# Patient Record
Sex: Female | Born: 1960 | Race: White | Hispanic: No | Marital: Married | Smoking: Never smoker
Health system: Southern US, Community
[De-identification: ages and names within clinical notes are randomized; demographics above are authoritative.]

## PROBLEM LIST (undated history)

## (undated) DIAGNOSIS — T4145XA Adverse effect of unspecified anesthetic, initial encounter: Secondary | ICD-10-CM

## (undated) DIAGNOSIS — R0600 Dyspnea, unspecified: Secondary | ICD-10-CM

## (undated) DIAGNOSIS — I10 Essential (primary) hypertension: Secondary | ICD-10-CM

## (undated) DIAGNOSIS — R112 Nausea with vomiting, unspecified: Secondary | ICD-10-CM

## (undated) DIAGNOSIS — J45909 Unspecified asthma, uncomplicated: Secondary | ICD-10-CM

## (undated) DIAGNOSIS — R011 Cardiac murmur, unspecified: Secondary | ICD-10-CM

## (undated) DIAGNOSIS — H9190 Unspecified hearing loss, unspecified ear: Secondary | ICD-10-CM

## (undated) DIAGNOSIS — C801 Malignant (primary) neoplasm, unspecified: Secondary | ICD-10-CM

## (undated) DIAGNOSIS — E7800 Pure hypercholesterolemia, unspecified: Secondary | ICD-10-CM

## (undated) DIAGNOSIS — Z9889 Other specified postprocedural states: Secondary | ICD-10-CM

## (undated) DIAGNOSIS — M1990 Unspecified osteoarthritis, unspecified site: Secondary | ICD-10-CM

## (undated) DIAGNOSIS — T8859XA Other complications of anesthesia, initial encounter: Secondary | ICD-10-CM

## (undated) DIAGNOSIS — C641 Malignant neoplasm of right kidney, except renal pelvis: Secondary | ICD-10-CM

## (undated) DIAGNOSIS — E079 Disorder of thyroid, unspecified: Secondary | ICD-10-CM

## (undated) DIAGNOSIS — D649 Anemia, unspecified: Secondary | ICD-10-CM

## (undated) DIAGNOSIS — Z8489 Family history of other specified conditions: Secondary | ICD-10-CM

## (undated) DIAGNOSIS — G459 Transient cerebral ischemic attack, unspecified: Secondary | ICD-10-CM

## (undated) DIAGNOSIS — I499 Cardiac arrhythmia, unspecified: Secondary | ICD-10-CM

## (undated) HISTORY — PX: TONSILLECTOMY: SUR1361

## (undated) HISTORY — PX: COLON SURGERY: SHX602

## (undated) HISTORY — PX: ABDOMINAL HYSTERECTOMY: SHX81

## (undated) HISTORY — PX: CHOLECYSTECTOMY: SHX55

## (undated) HISTORY — PX: KNEE ARTHROSCOPY: SUR90

## (undated) HISTORY — PX: OTHER SURGICAL HISTORY: SHX169

---

## 1997-07-26 ENCOUNTER — Emergency Department (HOSPITAL_COMMUNITY): Admission: EM | Admit: 1997-07-26 | Discharge: 1997-07-26 | Payer: Self-pay | Admitting: Emergency Medicine

## 1997-07-27 ENCOUNTER — Inpatient Hospital Stay (HOSPITAL_COMMUNITY): Admission: EM | Admit: 1997-07-27 | Discharge: 1997-07-29 | Payer: Self-pay | Admitting: Emergency Medicine

## 1998-03-11 ENCOUNTER — Emergency Department (HOSPITAL_COMMUNITY): Admission: EM | Admit: 1998-03-11 | Discharge: 1998-03-11 | Payer: Self-pay | Admitting: Emergency Medicine

## 1998-03-11 ENCOUNTER — Encounter: Payer: Self-pay | Admitting: Emergency Medicine

## 1999-07-13 ENCOUNTER — Other Ambulatory Visit: Admission: RE | Admit: 1999-07-13 | Discharge: 1999-07-13 | Payer: Self-pay | Admitting: *Deleted

## 1999-07-31 ENCOUNTER — Encounter: Payer: Self-pay | Admitting: *Deleted

## 1999-07-31 ENCOUNTER — Ambulatory Visit (HOSPITAL_COMMUNITY): Admission: RE | Admit: 1999-07-31 | Discharge: 1999-07-31 | Payer: Self-pay | Admitting: *Deleted

## 2000-09-15 ENCOUNTER — Encounter: Payer: Self-pay | Admitting: Orthopedic Surgery

## 2000-09-15 ENCOUNTER — Encounter: Admission: RE | Admit: 2000-09-15 | Discharge: 2000-09-15 | Payer: Self-pay | Admitting: Orthopedic Surgery

## 2001-07-13 ENCOUNTER — Other Ambulatory Visit: Admission: RE | Admit: 2001-07-13 | Discharge: 2001-07-13 | Payer: Self-pay | Admitting: Family Medicine

## 2002-07-31 ENCOUNTER — Encounter: Admission: RE | Admit: 2002-07-31 | Discharge: 2002-07-31 | Payer: Self-pay | Admitting: Family Medicine

## 2002-07-31 ENCOUNTER — Encounter: Payer: Self-pay | Admitting: Family Medicine

## 2002-09-06 ENCOUNTER — Ambulatory Visit (HOSPITAL_COMMUNITY): Admission: RE | Admit: 2002-09-06 | Discharge: 2002-09-06 | Payer: Self-pay | Admitting: Obstetrics & Gynecology

## 2002-09-06 ENCOUNTER — Encounter: Payer: Self-pay | Admitting: Obstetrics & Gynecology

## 2003-07-24 ENCOUNTER — Encounter: Admission: RE | Admit: 2003-07-24 | Discharge: 2003-07-24 | Payer: Self-pay | Admitting: Family Medicine

## 2003-11-04 ENCOUNTER — Ambulatory Visit (HOSPITAL_COMMUNITY): Admission: RE | Admit: 2003-11-04 | Discharge: 2003-11-04 | Payer: Self-pay | Admitting: Obstetrics & Gynecology

## 2003-11-23 ENCOUNTER — Inpatient Hospital Stay (HOSPITAL_COMMUNITY): Admission: EM | Admit: 2003-11-23 | Discharge: 2003-11-26 | Payer: Self-pay | Admitting: *Deleted

## 2005-09-23 ENCOUNTER — Ambulatory Visit (HOSPITAL_COMMUNITY): Admission: RE | Admit: 2005-09-23 | Discharge: 2005-09-23 | Payer: Self-pay | Admitting: Gastroenterology

## 2006-04-30 ENCOUNTER — Emergency Department (HOSPITAL_COMMUNITY): Admission: EM | Admit: 2006-04-30 | Discharge: 2006-04-30 | Payer: Self-pay | Admitting: Emergency Medicine

## 2006-05-24 ENCOUNTER — Emergency Department (HOSPITAL_COMMUNITY): Admission: EM | Admit: 2006-05-24 | Discharge: 2006-05-24 | Payer: Self-pay | Admitting: Emergency Medicine

## 2007-04-27 ENCOUNTER — Emergency Department (HOSPITAL_COMMUNITY): Admission: EM | Admit: 2007-04-27 | Discharge: 2007-04-27 | Payer: Self-pay | Admitting: Emergency Medicine

## 2007-04-30 ENCOUNTER — Emergency Department (HOSPITAL_COMMUNITY): Admission: EM | Admit: 2007-04-30 | Discharge: 2007-04-30 | Payer: Self-pay | Admitting: Emergency Medicine

## 2008-07-07 ENCOUNTER — Inpatient Hospital Stay (HOSPITAL_COMMUNITY): Admission: EM | Admit: 2008-07-07 | Discharge: 2008-07-12 | Payer: Self-pay | Admitting: Emergency Medicine

## 2008-12-02 ENCOUNTER — Emergency Department (HOSPITAL_COMMUNITY): Admission: EM | Admit: 2008-12-02 | Discharge: 2008-12-02 | Payer: Self-pay | Admitting: Emergency Medicine

## 2009-01-13 ENCOUNTER — Emergency Department (HOSPITAL_COMMUNITY): Admission: EM | Admit: 2009-01-13 | Discharge: 2009-01-13 | Payer: Self-pay | Admitting: Emergency Medicine

## 2009-01-31 ENCOUNTER — Emergency Department (HOSPITAL_COMMUNITY): Admission: EM | Admit: 2009-01-31 | Discharge: 2009-01-31 | Payer: Self-pay | Admitting: Emergency Medicine

## 2009-04-18 ENCOUNTER — Emergency Department (HOSPITAL_COMMUNITY): Admission: EM | Admit: 2009-04-18 | Discharge: 2009-04-18 | Payer: Self-pay | Admitting: Emergency Medicine

## 2009-04-22 ENCOUNTER — Emergency Department (HOSPITAL_COMMUNITY): Admission: EM | Admit: 2009-04-22 | Discharge: 2009-04-23 | Payer: Self-pay | Admitting: Emergency Medicine

## 2009-04-29 ENCOUNTER — Emergency Department (HOSPITAL_COMMUNITY): Admission: EM | Admit: 2009-04-29 | Discharge: 2009-04-29 | Payer: Self-pay | Admitting: Emergency Medicine

## 2009-05-15 ENCOUNTER — Ambulatory Visit (HOSPITAL_COMMUNITY): Admission: RE | Admit: 2009-05-15 | Discharge: 2009-05-15 | Payer: Self-pay | Admitting: Obstetrics & Gynecology

## 2009-05-29 ENCOUNTER — Emergency Department (HOSPITAL_COMMUNITY): Admission: EM | Admit: 2009-05-29 | Discharge: 2009-05-30 | Payer: Self-pay | Admitting: Emergency Medicine

## 2009-06-13 ENCOUNTER — Inpatient Hospital Stay (HOSPITAL_COMMUNITY): Admission: AD | Admit: 2009-06-13 | Discharge: 2009-06-14 | Payer: Self-pay | Admitting: Obstetrics & Gynecology

## 2009-06-13 ENCOUNTER — Encounter: Payer: Self-pay | Admitting: Obstetrics & Gynecology

## 2009-07-11 ENCOUNTER — Emergency Department (HOSPITAL_COMMUNITY): Admission: EM | Admit: 2009-07-11 | Discharge: 2009-07-11 | Payer: Self-pay | Admitting: Emergency Medicine

## 2009-11-01 ENCOUNTER — Emergency Department (HOSPITAL_COMMUNITY): Admission: EM | Admit: 2009-11-01 | Discharge: 2009-11-01 | Payer: Self-pay | Admitting: Emergency Medicine

## 2009-11-05 ENCOUNTER — Observation Stay (HOSPITAL_COMMUNITY): Admission: EM | Admit: 2009-11-05 | Discharge: 2009-11-06 | Payer: Self-pay | Admitting: Emergency Medicine

## 2010-02-15 ENCOUNTER — Emergency Department (HOSPITAL_COMMUNITY): Admission: EM | Admit: 2010-02-15 | Discharge: 2010-02-16 | Payer: Self-pay | Admitting: Internal Medicine

## 2010-02-23 ENCOUNTER — Emergency Department (HOSPITAL_COMMUNITY): Admission: EM | Admit: 2010-02-23 | Discharge: 2010-02-23 | Payer: Self-pay | Admitting: Emergency Medicine

## 2010-03-03 ENCOUNTER — Emergency Department (HOSPITAL_COMMUNITY)
Admission: EM | Admit: 2010-03-03 | Discharge: 2010-03-03 | Payer: Self-pay | Source: Home / Self Care | Admitting: Emergency Medicine

## 2010-03-05 ENCOUNTER — Emergency Department (HOSPITAL_COMMUNITY): Admission: EM | Admit: 2010-03-05 | Discharge: 2009-09-11 | Payer: Self-pay | Admitting: Emergency Medicine

## 2010-04-18 ENCOUNTER — Encounter: Payer: Self-pay | Admitting: Family Medicine

## 2010-06-08 LAB — URINALYSIS, ROUTINE W REFLEX MICROSCOPIC
Bilirubin Urine: NEGATIVE
Hgb urine dipstick: NEGATIVE
Ketones, ur: NEGATIVE mg/dL
Protein, ur: NEGATIVE mg/dL
Urobilinogen, UA: 0.2 mg/dL (ref 0.0–1.0)

## 2010-06-08 LAB — GLUCOSE, CAPILLARY: Glucose-Capillary: 137 mg/dL — ABNORMAL HIGH (ref 70–99)

## 2010-06-09 LAB — DIFFERENTIAL
Basophils Absolute: 0 10*3/uL (ref 0.0–0.1)
Eosinophils Absolute: 0 10*3/uL (ref 0.0–0.7)
Eosinophils Relative: 0 % (ref 0–5)
Lymphocytes Relative: 31 % (ref 12–46)
Lymphs Abs: 2.5 10*3/uL (ref 0.7–4.0)
Neutro Abs: 6.7 10*3/uL (ref 1.7–7.7)

## 2010-06-09 LAB — BASIC METABOLIC PANEL
BUN: 16 mg/dL (ref 6–23)
Creatinine, Ser: 0.75 mg/dL (ref 0.4–1.2)
GFR calc Af Amer: >60 mL/min (ref >60)
GFR calc non Af Amer: >60 mL/min (ref >60)
Potassium: 3.6 mEq/L (ref 3.5–5.1)

## 2010-06-09 LAB — TROPONIN I: Troponin I: 0.01 ng/mL (ref 0.00–0.06)

## 2010-06-09 LAB — POCT I-STAT, CHEM 8
Creatinine, Ser: 1 mg/dL (ref 0.4–1.2)
Hemoglobin: 15 g/dL (ref 12.0–15.0)
Sodium: 139 mEq/L (ref 135–145)
TCO2: 24 mmol/L (ref 0–100)

## 2010-06-09 LAB — CK TOTAL AND CKMB (NOT AT ARMC)
CK, MB: 1 ng/mL (ref 0.3–4.0)
Relative Index: INVALID (ref 0.0–2.5)
Total CK: 53 U/L (ref 7–177)

## 2010-06-09 LAB — CBC
MCH: 29.9 pg (ref 26.0–34.0)
MCV: 86.3 fL (ref 78.0–100.0)
Platelets: 343 10*3/uL (ref 150–400)
Platelets: 360 10*3/uL (ref 150–400)
RBC: 4.72 MIL/uL (ref 3.87–5.11)
RDW: 13.7 % (ref 11.5–15.5)
RDW: 14.5 % (ref 11.5–15.5)
WBC: 11.4 10*3/uL — ABNORMAL HIGH (ref 4.0–10.5)

## 2010-06-09 LAB — URINALYSIS, ROUTINE W REFLEX MICROSCOPIC
Bilirubin Urine: NEGATIVE
Hgb urine dipstick: NEGATIVE
Ketones, ur: NEGATIVE mg/dL
Protein, ur: NEGATIVE mg/dL
Urobilinogen, UA: 0.2 mg/dL (ref 0.0–1.0)

## 2010-06-09 LAB — BRAIN NATRIURETIC PEPTIDE: Pro B Natriuretic peptide (BNP): 47 pg/mL (ref 0.0–100.0)

## 2010-06-12 LAB — COMPREHENSIVE METABOLIC PANEL
ALT: 18 U/L (ref 0–35)
Alkaline Phosphatase: 62 U/L (ref 39–117)
CO2: 24 mEq/L (ref 19–32)
Glucose, Bld: 125 mg/dL — ABNORMAL HIGH (ref 70–99)
Potassium: 3.2 mEq/L — ABNORMAL LOW (ref 3.5–5.1)
Sodium: 140 mEq/L (ref 135–145)
Total Protein: 6.1 g/dL (ref 6.0–8.3)

## 2010-06-12 LAB — URINALYSIS, ROUTINE W REFLEX MICROSCOPIC
Nitrite: NEGATIVE
Specific Gravity, Urine: 1.01 (ref 1.005–1.030)
pH: 6 (ref 5.0–8.0)

## 2010-06-12 LAB — POCT URINALYSIS DIPSTICK
Bilirubin Urine: NEGATIVE
Nitrite: NEGATIVE
Protein, ur: NEGATIVE mg/dL
Urobilinogen, UA: 0.2 mg/dL (ref 0.0–1.0)
pH: 5.5 (ref 5.0–8.0)

## 2010-06-12 LAB — URINE CULTURE: Culture  Setup Time: 201108071207

## 2010-06-12 LAB — GLUCOSE, CAPILLARY
Glucose-Capillary: 123 mg/dL — ABNORMAL HIGH (ref 70–99)
Glucose-Capillary: 125 mg/dL — ABNORMAL HIGH (ref 70–99)
Glucose-Capillary: 128 mg/dL — ABNORMAL HIGH (ref 70–99)
Glucose-Capillary: 155 mg/dL — ABNORMAL HIGH (ref 70–99)

## 2010-06-12 LAB — CBC
HCT: 34 % — ABNORMAL LOW (ref 36.0–46.0)
HCT: 40 % (ref 36.0–46.0)
Hemoglobin: 11.4 g/dL — ABNORMAL LOW (ref 12.0–15.0)
MCH: 29.6 pg (ref 26.0–34.0)
MCHC: 34.3 g/dL (ref 30.0–36.0)
RDW: 13.7 % (ref 11.5–15.5)
RDW: 13.9 % (ref 11.5–15.5)
WBC: 7.1 10*3/uL (ref 4.0–10.5)

## 2010-06-12 LAB — DIFFERENTIAL
Basophils Absolute: 0 10*3/uL (ref 0.0–0.1)
Basophils Relative: 0 % (ref 0–1)
Basophils Relative: 0 % (ref 0–1)
Eosinophils Absolute: 0.3 10*3/uL (ref 0.0–0.7)
Eosinophils Absolute: 0.3 10*3/uL (ref 0.0–0.7)
Monocytes Absolute: 0.7 10*3/uL (ref 0.1–1.0)
Monocytes Relative: 6 % (ref 3–12)
Neutro Abs: 6.9 10*3/uL (ref 1.7–7.7)
Neutrophils Relative %: 55 % (ref 43–77)
Neutrophils Relative %: 67 % (ref 43–77)

## 2010-06-12 LAB — BASIC METABOLIC PANEL
BUN: 20 mg/dL (ref 6–23)
CO2: 27 mEq/L (ref 19–32)
Calcium: 9.2 mg/dL (ref 8.4–10.5)
GFR calc non Af Amer: 57 mL/min — ABNORMAL LOW (ref >60)
Glucose, Bld: 150 mg/dL — ABNORMAL HIGH (ref 70–99)

## 2010-06-12 LAB — HEPATIC FUNCTION PANEL
AST: 23 U/L (ref 0–37)
Bilirubin, Direct: 0.2 mg/dL (ref 0.0–0.3)
Total Bilirubin: 1 mg/dL (ref 0.3–1.2)

## 2010-06-12 LAB — CULTURE, BLOOD (ROUTINE X 2): Culture: NO GROWTH

## 2010-06-12 LAB — URINE MICROSCOPIC-ADD ON

## 2010-06-12 LAB — LIPASE, BLOOD: Lipase: 42 U/L (ref 11–59)

## 2010-06-14 LAB — DIFFERENTIAL
Basophils Absolute: 0.1 10*3/uL (ref 0.0–0.1)
Basophils Relative: 1 % (ref 0–1)
Eosinophils Relative: 3 % (ref 0–5)
Lymphocytes Relative: 19 % (ref 12–46)
Lymphocytes Relative: 36 % (ref 12–46)
Monocytes Absolute: 0.4 10*3/uL (ref 0.1–1.0)
Monocytes Absolute: 0.6 10*3/uL (ref 0.1–1.0)
Monocytes Relative: 5 % (ref 3–12)
Monocytes Relative: 6 % (ref 3–12)
Neutro Abs: 6.7 10*3/uL (ref 1.7–7.7)
Neutro Abs: 4.9 10*3/uL (ref 1.7–7.7)
Neutrophils Relative %: 73 % (ref 43–77)

## 2010-06-14 LAB — URINALYSIS, ROUTINE W REFLEX MICROSCOPIC
Bilirubin Urine: NEGATIVE
Bilirubin Urine: NEGATIVE
Glucose, UA: NEGATIVE mg/dL
Glucose, UA: NEGATIVE mg/dL
Hgb urine dipstick: NEGATIVE
Hgb urine dipstick: NEGATIVE
Hgb urine dipstick: NEGATIVE
Nitrite: NEGATIVE
Protein, ur: NEGATIVE mg/dL
Specific Gravity, Urine: 1.019 (ref 1.005–1.030)
Specific Gravity, Urine: 1.022 (ref 1.005–1.030)
Specific Gravity, Urine: 1.009 (ref 1.005–1.030)
Urobilinogen, UA: 0.2 mg/dL (ref 0.0–1.0)
Urobilinogen, UA: 0.2 mg/dL (ref 0.0–1.0)
Urobilinogen, UA: 0.2 mg/dL (ref 0.0–1.0)
pH: 6 (ref 5.0–8.0)
pH: 6 (ref 5.0–8.0)

## 2010-06-14 LAB — CBC
HCT: 33.2 % — ABNORMAL LOW (ref 36.0–46.0)
HCT: 38.2 % (ref 36.0–46.0)
Hemoglobin: 13.1 g/dL (ref 12.0–15.0)
MCHC: 34.4 g/dL (ref 30.0–36.0)
MCV: 88.5 fL (ref 78.0–100.0)
Platelets: 275 10*3/uL (ref 150–400)
RBC: 4.31 MIL/uL (ref 3.87–5.11)
RDW: 13 % (ref 11.5–15.5)
RDW: 13.9 % (ref 11.5–15.5)

## 2010-06-14 LAB — URINE MICROSCOPIC-ADD ON

## 2010-06-14 LAB — COMPREHENSIVE METABOLIC PANEL
Albumin: 3.3 g/dL — ABNORMAL LOW (ref 3.5–5.2)
BUN: 9 mg/dL (ref 6–23)
Creatinine, Ser: 0.6 mg/dL (ref 0.4–1.2)
Glucose, Bld: 141 mg/dL — ABNORMAL HIGH (ref 70–99)
Total Bilirubin: 0.6 mg/dL (ref 0.3–1.2)
Total Protein: 6.6 g/dL (ref 6.0–8.3)

## 2010-06-14 LAB — URINE CULTURE
Colony Count: NO GROWTH
Culture: NO GROWTH

## 2010-06-14 LAB — GLUCOSE, CAPILLARY

## 2010-06-14 LAB — BASIC METABOLIC PANEL
CO2: 29 mEq/L (ref 19–32)
Calcium: 9.4 mg/dL (ref 8.4–10.5)
Chloride: 105 mEq/L (ref 96–112)
GFR calc Af Amer: >60 mL/min (ref >60)
Glucose, Bld: 124 mg/dL — ABNORMAL HIGH (ref 70–99)
Potassium: 3.2 mEq/L — ABNORMAL LOW (ref 3.5–5.1)
Sodium: 141 mEq/L (ref 135–145)

## 2010-06-14 LAB — GC/CHLAMYDIA PROBE AMP, GENITAL
Chlamydia, DNA Probe: NEGATIVE
GC Probe Amp, Genital: NEGATIVE

## 2010-06-14 LAB — WET PREP, GENITAL: Yeast Wet Prep HPF POC: NONE SEEN

## 2010-06-16 LAB — URINALYSIS, ROUTINE W REFLEX MICROSCOPIC
Bilirubin Urine: NEGATIVE
Hgb urine dipstick: NEGATIVE
Ketones, ur: NEGATIVE mg/dL
Specific Gravity, Urine: 1.017 (ref 1.005–1.030)
pH: 6 (ref 5.0–8.0)

## 2010-06-16 LAB — CBC
HCT: 37.2 % (ref 36.0–46.0)
MCHC: 34.6 g/dL (ref 30.0–36.0)
MCV: 87.6 fL (ref 78.0–100.0)
RBC: 4.24 MIL/uL (ref 3.87–5.11)
WBC: 8.5 10*3/uL (ref 4.0–10.5)

## 2010-06-16 LAB — DIFFERENTIAL
Basophils Absolute: 0 10*3/uL (ref 0.0–0.1)
Basophils Relative: 0 % (ref 0–1)
Eosinophils Absolute: 0.4 10*3/uL (ref 0.0–0.7)
Eosinophils Relative: 5 % (ref 0–5)
Neutrophils Relative %: 68 % (ref 43–77)

## 2010-06-16 LAB — COMPREHENSIVE METABOLIC PANEL
BUN: 11 mg/dL (ref 6–23)
CO2: 26 mEq/L (ref 19–32)
Chloride: 107 mEq/L (ref 96–112)
Creatinine, Ser: 0.72 mg/dL (ref 0.4–1.2)
GFR calc non Af Amer: >60 mL/min (ref >60)
Glucose, Bld: 124 mg/dL — ABNORMAL HIGH (ref 70–99)
Total Bilirubin: 0.9 mg/dL (ref 0.3–1.2)

## 2010-06-16 LAB — LIPASE, BLOOD: Lipase: 26 U/L (ref 11–59)

## 2010-06-17 LAB — DIFFERENTIAL
Basophils Absolute: 0 10*3/uL (ref 0.0–0.1)
Basophils Relative: 1 % (ref 0–1)
Lymphocytes Relative: 28 % (ref 12–46)
Monocytes Absolute: 0.5 10*3/uL (ref 0.1–1.0)
Neutro Abs: 6.1 10*3/uL (ref 1.7–7.7)
Neutrophils Relative %: 63 % (ref 43–77)

## 2010-06-17 LAB — CBC
MCHC: 33 g/dL (ref 30.0–36.0)
Platelets: 322 10*3/uL (ref 150–400)
RDW: 13.7 % (ref 11.5–15.5)

## 2010-06-17 LAB — WET PREP, GENITAL
Trich, Wet Prep: NONE SEEN
Yeast Wet Prep HPF POC: NONE SEEN

## 2010-06-17 LAB — PREGNANCY, URINE: Preg Test, Ur: NEGATIVE

## 2010-06-17 LAB — RPR: RPR Ser Ql: NONREACTIVE

## 2010-06-22 LAB — CBC
MCHC: 32.8 g/dL (ref 30.0–36.0)
MCV: 90.7 fL (ref 78.0–100.0)
Platelets: 380 10*3/uL (ref 150–400)
RBC: 3.33 MIL/uL — ABNORMAL LOW (ref 3.87–5.11)
WBC: 10.1 10*3/uL (ref 4.0–10.5)
WBC: 12.1 10*3/uL — ABNORMAL HIGH (ref 4.0–10.5)

## 2010-06-22 LAB — COMPREHENSIVE METABOLIC PANEL
ALT: 25 U/L (ref 0–35)
Alkaline Phosphatase: 78 U/L (ref 39–117)
Chloride: 103 mEq/L (ref 96–112)
Glucose, Bld: 120 mg/dL — ABNORMAL HIGH (ref 70–99)
Potassium: 2.9 mEq/L — ABNORMAL LOW (ref 3.5–5.1)
Sodium: 140 mEq/L (ref 135–145)
Total Bilirubin: 0.7 mg/dL (ref 0.3–1.2)
Total Protein: 7.2 g/dL (ref 6.0–8.3)

## 2010-06-22 LAB — GLUCOSE, CAPILLARY
Glucose-Capillary: 133 mg/dL — ABNORMAL HIGH (ref 70–99)
Glucose-Capillary: 135 mg/dL — ABNORMAL HIGH (ref 70–99)
Glucose-Capillary: 169 mg/dL — ABNORMAL HIGH (ref 70–99)

## 2010-06-22 LAB — BASIC METABOLIC PANEL
Calcium: 7.7 mg/dL — ABNORMAL LOW (ref 8.4–10.5)
Creatinine, Ser: 0.67 mg/dL (ref 0.4–1.2)
GFR calc Af Amer: >60 mL/min (ref >60)
GFR calc non Af Amer: >60 mL/min (ref >60)

## 2010-06-22 LAB — POTASSIUM: Potassium: 3.7 mEq/L (ref 3.5–5.1)

## 2010-07-01 LAB — URINALYSIS, ROUTINE W REFLEX MICROSCOPIC
Ketones, ur: NEGATIVE mg/dL
Nitrite: NEGATIVE
Specific Gravity, Urine: 1.015 (ref 1.005–1.030)
pH: 6 (ref 5.0–8.0)

## 2010-07-01 LAB — DIFFERENTIAL
Basophils Relative: 1 % (ref 0–1)
Eosinophils Absolute: 0.2 10*3/uL (ref 0.0–0.7)
Eosinophils Relative: 2 % (ref 0–5)
Lymphs Abs: 2.2 10*3/uL (ref 0.7–4.0)

## 2010-07-01 LAB — COMPREHENSIVE METABOLIC PANEL
ALT: 18 U/L (ref 0–35)
AST: 15 U/L (ref 0–37)
Alkaline Phosphatase: 75 U/L (ref 39–117)
CO2: 24 mEq/L (ref 19–32)
Chloride: 104 mEq/L (ref 96–112)
Creatinine, Ser: 0.64 mg/dL (ref 0.4–1.2)
GFR calc Af Amer: >60 mL/min (ref >60)
GFR calc non Af Amer: >60 mL/min (ref >60)
Total Bilirubin: 0.7 mg/dL (ref 0.3–1.2)

## 2010-07-01 LAB — CBC
MCV: 89.8 fL (ref 78.0–100.0)
RBC: 4.2 MIL/uL (ref 3.87–5.11)
WBC: 8.3 10*3/uL (ref 4.0–10.5)

## 2010-07-01 LAB — LIPASE, BLOOD: Lipase: 21 U/L (ref 11–59)

## 2010-07-01 LAB — URINE MICROSCOPIC-ADD ON

## 2010-07-01 LAB — URINE CULTURE: Colony Count: NO GROWTH

## 2010-07-02 LAB — POCT URINALYSIS DIP (DEVICE)
Ketones, ur: NEGATIVE mg/dL
Protein, ur: NEGATIVE mg/dL
Specific Gravity, Urine: 1.015 (ref 1.005–1.030)
Urobilinogen, UA: 0.2 mg/dL (ref 0.0–1.0)
pH: 5.5 (ref 5.0–8.0)

## 2010-07-02 LAB — URINE CULTURE

## 2010-07-03 LAB — DIFFERENTIAL
Basophils Absolute: 0 K/uL (ref 0.0–0.1)
Basophils Relative: 1 % (ref 0–1)
Eosinophils Absolute: 0.5 K/uL (ref 0.0–0.7)
Eosinophils Relative: 5 % (ref 0–5)
Lymphocytes Relative: 25 % (ref 12–46)
Lymphs Abs: 2.3 K/uL (ref 0.7–4.0)
Monocytes Absolute: 0.5 K/uL (ref 0.1–1.0)
Monocytes Relative: 5 % (ref 3–12)
Neutro Abs: 6 K/uL (ref 1.7–7.7)
Neutrophils Relative %: 65 % (ref 43–77)

## 2010-07-03 LAB — URINALYSIS, ROUTINE W REFLEX MICROSCOPIC
Bilirubin Urine: NEGATIVE
Glucose, UA: NEGATIVE mg/dL
Hgb urine dipstick: NEGATIVE
Ketones, ur: NEGATIVE mg/dL
Leukocytes, UA: NEGATIVE
Nitrite: POSITIVE — AB
Protein, ur: NEGATIVE mg/dL
Specific Gravity, Urine: 1.015 (ref 1.005–1.030)
Urobilinogen, UA: 0.2 mg/dL (ref 0.0–1.0)
pH: 5.5 (ref 5.0–8.0)

## 2010-07-03 LAB — CBC
RBC: 4.35 MIL/uL (ref 3.87–5.11)
WBC: 9.3 10*3/uL (ref 4.0–10.5)

## 2010-07-03 LAB — POCT PREGNANCY, URINE: Preg Test, Ur: NEGATIVE

## 2010-07-03 LAB — URINE CULTURE

## 2010-07-03 LAB — COMPREHENSIVE METABOLIC PANEL
ALT: 20 U/L (ref 0–35)
AST: 19 U/L (ref 0–37)
CO2: 29 mEq/L (ref 19–32)
Calcium: 8.7 mg/dL (ref 8.4–10.5)
Chloride: 104 mEq/L (ref 96–112)
GFR calc Af Amer: >60 mL/min (ref >60)
GFR calc non Af Amer: >60 mL/min (ref >60)
Sodium: 141 mEq/L (ref 135–145)
Total Bilirubin: 0.6 mg/dL (ref 0.3–1.2)

## 2010-07-03 LAB — GLUCOSE, CAPILLARY: Glucose-Capillary: 120 mg/dL — ABNORMAL HIGH (ref 70–99)

## 2010-07-03 LAB — URINE MICROSCOPIC-ADD ON

## 2010-07-08 LAB — BASIC METABOLIC PANEL
BUN: 8 mg/dL (ref 6–23)
BUN: 12 mg/dL (ref 6–23)
CO2: 21 mEq/L (ref 19–32)
CO2: 25 mEq/L (ref 19–32)
CO2: 26 mEq/L (ref 19–32)
CO2: 28 mEq/L (ref 19–32)
Calcium: 7.9 mg/dL — ABNORMAL LOW (ref 8.4–10.5)
Calcium: 7.9 mg/dL — ABNORMAL LOW (ref 8.4–10.5)
Calcium: 8.2 mg/dL — ABNORMAL LOW (ref 8.4–10.5)
Chloride: 111 mEq/L (ref 96–112)
Chloride: 112 mEq/L (ref 96–112)
Chloride: 96 mEq/L (ref 96–112)
Creatinine, Ser: 0.78 mg/dL (ref 0.4–1.2)
Creatinine, Ser: 1.05 mg/dL (ref 0.4–1.2)
GFR calc Af Amer: 54 mL/min — ABNORMAL LOW (ref >60)
GFR calc Af Amer: >60 mL/min (ref >60)
GFR calc non Af Amer: >60 mL/min (ref >60)
GFR calc non Af Amer: 56 mL/min — ABNORMAL LOW (ref >60)
GFR calc non Af Amer: >60 mL/min (ref >60)
GFR calc non Af Amer: >60 mL/min (ref >60)
Glucose, Bld: 140 mg/dL — ABNORMAL HIGH (ref 70–99)
Glucose, Bld: 144 mg/dL — ABNORMAL HIGH (ref 70–99)
Glucose, Bld: 201 mg/dL — ABNORMAL HIGH (ref 70–99)
Potassium: 3.6 mEq/L (ref 3.5–5.1)
Potassium: 3.1 mEq/L — ABNORMAL LOW (ref 3.5–5.1)
Sodium: 134 mEq/L — ABNORMAL LOW (ref 135–145)
Sodium: 136 mEq/L (ref 135–145)
Sodium: 142 mEq/L (ref 135–145)
Sodium: 142 mEq/L (ref 135–145)

## 2010-07-08 LAB — GLUCOSE, CAPILLARY
Glucose-Capillary: 119 mg/dL — ABNORMAL HIGH (ref 70–99)
Glucose-Capillary: 122 mg/dL — ABNORMAL HIGH (ref 70–99)
Glucose-Capillary: 125 mg/dL — ABNORMAL HIGH (ref 70–99)
Glucose-Capillary: 135 mg/dL — ABNORMAL HIGH (ref 70–99)
Glucose-Capillary: 153 mg/dL — ABNORMAL HIGH (ref 70–99)
Glucose-Capillary: 228 mg/dL — ABNORMAL HIGH (ref 70–99)
Glucose-Capillary: 309 mg/dL — ABNORMAL HIGH (ref 70–99)

## 2010-07-08 LAB — URINALYSIS, ROUTINE W REFLEX MICROSCOPIC
Glucose, UA: NEGATIVE mg/dL
Specific Gravity, Urine: 1.018 (ref 1.005–1.030)
pH: 6.5 (ref 5.0–8.0)

## 2010-07-08 LAB — COMPREHENSIVE METABOLIC PANEL
Alkaline Phosphatase: 61 U/L (ref 39–117)
BUN: 29 mg/dL — ABNORMAL HIGH (ref 6–23)
CO2: 32 mEq/L (ref 19–32)
Calcium: 8.7 mg/dL (ref 8.4–10.5)
GFR calc non Af Amer: 49 mL/min — ABNORMAL LOW (ref >60)
Glucose, Bld: 207 mg/dL — ABNORMAL HIGH (ref 70–99)
Total Protein: 7.3 g/dL (ref 6.0–8.3)

## 2010-07-08 LAB — DIFFERENTIAL
Basophils Relative: 0 % (ref 0–1)
Eosinophils Absolute: 0.1 10*3/uL (ref 0.0–0.7)
Lymphs Abs: 2.4 10*3/uL (ref 0.7–4.0)
Monocytes Relative: 7 % (ref 3–12)
Neutro Abs: 6.5 10*3/uL (ref 1.7–7.7)
Neutrophils Relative %: 67 % (ref 43–77)

## 2010-07-08 LAB — HEMOGLOBIN A1C
Hgb A1c MFr Bld: 8 % — ABNORMAL HIGH (ref 4.6–6.1)
Mean Plasma Glucose: 183 mg/dL

## 2010-07-08 LAB — URINE MICROSCOPIC-ADD ON

## 2010-07-08 LAB — CBC
HCT: 41.2 % (ref 36.0–46.0)
Hemoglobin: 14.4 g/dL (ref 12.0–15.0)
MCHC: 35.1 g/dL (ref 30.0–36.0)
RBC: 4.64 MIL/uL (ref 3.87–5.11)
RDW: 13.6 % (ref 11.5–15.5)

## 2010-07-08 LAB — LIPID PANEL
Cholesterol: 162 mg/dL (ref 0–200)
HDL: 29 mg/dL — ABNORMAL LOW (ref >39)
Triglycerides: 164 mg/dL — ABNORMAL HIGH (ref <150)

## 2010-07-08 LAB — LIPASE, BLOOD: Lipase: 34 U/L (ref 11–59)

## 2010-07-08 LAB — POTASSIUM: Potassium: 2.2 mEq/L — CL (ref 3.5–5.1)

## 2010-07-08 LAB — URINE CULTURE

## 2010-08-14 NOTE — Discharge Summary (Signed)
[name], [name]               ACCOUNT NO.:  [id number]   MEDICAL RECORD NO.:  [id number]          PATIENT TYPE:  INP   LOCATION:  5506                         FACILITY:  [facility]   PHYSICIAN:  Betti D. Pecola Leisure, M.D.   DATE OF BIRTH:  1961-01-14   DATE OF ADMISSION:  11/23/2003  DATE OF DISCHARGE:  11/26/2003                                 DISCHARGE SUMMARY   Patient was admitted for complaints of a syncopal episode.  She states that  she had a syncopal episode when she went to the restroom earlier that  morning at 5 a.m.  She was found on the floor with her eyes rolling back in  her head, per her husband's report.  CT in the ED was negative.  The patient  does not have a history of seizure disorder.  She does have a history of  hypertension and had been taking diuretics, of which was Lasix.  She has a  history of intermittent hypokalemia, for which she has been prescribed K-  Dur.  Her other medical history is insignificant.  Upon admission, the  patient has a temperature of 97.8, blood pressure 117/89 with a pulse of 45.  Respirations are 16 with a normal O2 sat.   Her physical exam only revealed a grade 2/6 diastolic murmur.  Neurologic  exam was negative.   Her UA was negative.  Potassium was 2.3 with a glucose of 226.  White count  and platelets were normal.   Patient was initiated on IV fluids, which included potassium x3 runs  followed by K-Dur 40 mEq p.o. q.d.  Blood sugars were to be monitored.  Cardiac workup was negative.  Patient recovered with IV potassium as well as  oral potassium with a normalized potassium level upon discharge.   Her hypokalemia was most likely due to her diuretic use.  She was to  continue on the K-Dur 40 mEq per day and outpatient labs in two days.  Her  hyperglycemia was treated with Glucophage XR 500 mg q.d., as it would  indicated a metabolic syndrome in the patient.  Her hemoglobin A1C was  normal.  She was to follow up with Dr. Pecola Leisure two  days post discharge.       BDR/MEDQ  D:  03/13/2004  T:  03/14/2004  Job:  [id number]

## 2010-08-14 NOTE — Discharge Summary (Signed)
[name], [name]               ACCOUNT NO.:  [id number]   MEDICAL RECORD NO.:  [id number]          PATIENT TYPE:  INP   LOCATION:  5125                         FACILITY:  [facility]   PHYSICIAN:  [name], M.D.   DATE OF BIRTH:  1960-12-12   DATE OF ADMISSION:  07/07/2008  DATE OF DISCHARGE:  07/12/2008                               DISCHARGE SUMMARY   DISCHARGE DIAGNOSES:  1. New onset type 2 diabetes.  2. Gastroenteritis.  3. Hypokalemia by history.  4. Urinary tract infection.   ADMISSION DIAGNOSES:  1. New onset type 2 diabetes.  2. Gastroenteritis.  3. Hypokalemia.  4. Urinary tract infection.   REASON FOR ADMISSION:  A 50 year old white female with a history of  hypokalemia, obesity, and dependent edema who had been complaining of  intermittent nausea, vomiting, anorexia, and abdominal discomfort for  the past 24-48 hours.  She decided to visit the ER, when she became  lethargic and dizzy on the date of her visit.  She denied any headache,  chest pain, hemoptysis, lower back pain, hematochezia, or melena.  She  has taken no over-the-counter, herbal products, or any other unusual  medicines as well.   PHYSICAL EXAMINATION:  She had a normal temperature of 97.2, a pulse of  54 with a blood pressure of 106/51, her sats were 97% on room air.  Her  blood glucose was 309 at 10 p.m. the night previous.   PERTINENT LABORATORY DATA:  Potassium of less than 2 which was reported  on July 07, 2008, with a blood sugar of 207.  Her hemoglobin and  hematocrit were normal at 14.4, hematocrit 41.2.  Her urinalysis  revealed many bacteria, leukocytes, and epithelial cells in her urine.  Her hemoglobin A1c was 8.0.   This patient was admitted for treatment of her new onset of type 2  diabetes where she was placed on a sliding scale insulin as well as  Glucophage 1000 mg 1 p.o. b.i.d.  There was an order placed for diabetic  eduction which she received on hospital day 3.  She was  placed on 2000-  calorie ADA diet.  Due to her nausea and vomiting which were probably  secondary to her hypoglycemia.  She received Phenergan by mouth as  needed.  The patient did receive IV hydration for both her nausea and  vomiting, and her type 2 diabetes to control her insulin, glucose shift  and her hypokalemia.  For her hypokalemia dip, she was given potassium  by mouth.  To treat her UTI, she was placed on Cipro 250 one p.o. b.i.d.  which she was remained on for a total of 7-10 days.  Over the 3-day  course, the patient was adequately rehydrated with an increased  improvement in her potassium.  She did well with her new diet for her  type 2 diabetes which also showed improvement in her glucose which was  noted on day #4, which appeared to be in the 130s to 140s range.  The  patient was taken off her sliding scale insulin and discharged home on  metformin  1000 mg b.i.d. and she was continued to follow up with  diabetic eduction and she received a glucometer as well.  Her  hypokalemia had resolved prior to her discharge where her potassium  reached 3.4.  She was continued on her Cipro as an outpatient and  discharged home to follow up with Dr. Pecola Mcconnell on July 16, 2008.   DISCHARGE MEDICATIONS:  1. Atenolol 50 mg daily.  2. Hydrochlorothiazide 12.5 mg daily.  3. Lasix 20 mg twice a day.  4. Aspirin 81 mg daily.  5. Glucophage 1000 mg p.o. b.i.d.  6. Cipro 250 mg 1 p.o. daily.           ______________________________  [name], M.D.     BDR/MEDQ  D:  09/17/2008  T:  09/18/2008  Job:  [id number]

## 2010-08-17 ENCOUNTER — Emergency Department (HOSPITAL_COMMUNITY)
Admission: EM | Admit: 2010-08-17 | Discharge: 2010-08-17 | Disposition: A | Discharge status: Home / Self Care | Payer: BC Managed Care – PPO | Attending: Emergency Medicine | Admitting: Emergency Medicine

## 2010-08-17 DIAGNOSIS — R300 Dysuria: Secondary | ICD-10-CM | POA: Insufficient documentation

## 2010-08-17 DIAGNOSIS — I10 Essential (primary) hypertension: Secondary | ICD-10-CM | POA: Insufficient documentation

## 2010-08-17 DIAGNOSIS — R110 Nausea: Secondary | ICD-10-CM | POA: Insufficient documentation

## 2010-08-17 DIAGNOSIS — R109 Unspecified abdominal pain: Secondary | ICD-10-CM | POA: Insufficient documentation

## 2010-08-17 DIAGNOSIS — E119 Type 2 diabetes mellitus without complications: Secondary | ICD-10-CM | POA: Insufficient documentation

## 2010-08-17 DIAGNOSIS — N390 Urinary tract infection, site not specified: Secondary | ICD-10-CM | POA: Insufficient documentation

## 2010-08-17 LAB — URINALYSIS, ROUTINE W REFLEX MICROSCOPIC
Bilirubin Urine: NEGATIVE
Glucose, UA: NEGATIVE mg/dL
Ketones, ur: NEGATIVE mg/dL
Nitrite: NEGATIVE
Protein, ur: NEGATIVE mg/dL
Specific Gravity, Urine: 1.02 (ref 1.005–1.030)
Urobilinogen, UA: 0.2 mg/dL (ref 0.0–1.0)
pH: 6 (ref 5.0–8.0)

## 2010-08-17 LAB — URINE MICROSCOPIC-ADD ON

## 2010-08-17 LAB — GLUCOSE, CAPILLARY: Glucose-Capillary: 119 mg/dL — ABNORMAL HIGH (ref 70–99)

## 2010-08-26 ENCOUNTER — Observation Stay (HOSPITAL_COMMUNITY)
Admission: EM | Admit: 2010-08-26 | Discharge: 2010-08-27 | Disposition: A | Discharge status: Home / Self Care | Payer: BC Managed Care – PPO | Attending: Emergency Medicine | Admitting: Emergency Medicine

## 2010-08-26 DIAGNOSIS — L509 Urticaria, unspecified: Principal | ICD-10-CM | POA: Insufficient documentation

## 2010-08-28 ENCOUNTER — Emergency Department (HOSPITAL_COMMUNITY)
Admission: EM | Admit: 2010-08-28 | Discharge: 2010-08-28 | Disposition: A | Discharge status: Home / Self Care | Payer: BC Managed Care – PPO | Attending: Emergency Medicine | Admitting: Emergency Medicine

## 2010-08-28 DIAGNOSIS — L259 Unspecified contact dermatitis, unspecified cause: Secondary | ICD-10-CM | POA: Insufficient documentation

## 2010-08-28 DIAGNOSIS — X58XXXA Exposure to other specified factors, initial encounter: Secondary | ICD-10-CM | POA: Insufficient documentation

## 2010-08-28 DIAGNOSIS — T783XXA Angioneurotic edema, initial encounter: Secondary | ICD-10-CM | POA: Insufficient documentation

## 2010-08-28 DIAGNOSIS — R221 Localized swelling, mass and lump, neck: Secondary | ICD-10-CM | POA: Insufficient documentation

## 2010-08-28 DIAGNOSIS — E119 Type 2 diabetes mellitus without complications: Secondary | ICD-10-CM | POA: Insufficient documentation

## 2010-08-28 DIAGNOSIS — Z9889 Other specified postprocedural states: Secondary | ICD-10-CM | POA: Insufficient documentation

## 2010-08-28 DIAGNOSIS — I10 Essential (primary) hypertension: Secondary | ICD-10-CM | POA: Insufficient documentation

## 2010-08-28 DIAGNOSIS — R220 Localized swelling, mass and lump, head: Secondary | ICD-10-CM | POA: Insufficient documentation

## 2010-11-05 ENCOUNTER — Emergency Department (HOSPITAL_COMMUNITY)
Admission: EM | Admit: 2010-11-05 | Discharge: 2010-11-05 | Disposition: A | Discharge status: Home / Self Care | Payer: BC Managed Care – PPO | Attending: Emergency Medicine | Admitting: Emergency Medicine

## 2010-11-05 DIAGNOSIS — E876 Hypokalemia: Secondary | ICD-10-CM | POA: Insufficient documentation

## 2010-11-05 DIAGNOSIS — Z79899 Other long term (current) drug therapy: Secondary | ICD-10-CM | POA: Insufficient documentation

## 2010-11-05 DIAGNOSIS — E669 Obesity, unspecified: Secondary | ICD-10-CM | POA: Insufficient documentation

## 2010-11-05 DIAGNOSIS — I10 Essential (primary) hypertension: Secondary | ICD-10-CM | POA: Insufficient documentation

## 2010-11-05 DIAGNOSIS — E119 Type 2 diabetes mellitus without complications: Secondary | ICD-10-CM | POA: Insufficient documentation

## 2010-11-05 DIAGNOSIS — Z7982 Long term (current) use of aspirin: Secondary | ICD-10-CM | POA: Insufficient documentation

## 2010-11-05 DIAGNOSIS — I951 Orthostatic hypotension: Secondary | ICD-10-CM | POA: Insufficient documentation

## 2010-11-05 DIAGNOSIS — R110 Nausea: Secondary | ICD-10-CM | POA: Insufficient documentation

## 2010-11-05 LAB — POCT I-STAT, CHEM 8
BUN: 13 mg/dL (ref 6–23)
Calcium, Ion: 1.04 mmol/L — ABNORMAL LOW (ref 1.12–1.32)
Calcium, Ion: 1.06 mmol/L — ABNORMAL LOW (ref 1.12–1.32)
Chloride: 102 mEq/L (ref 96–112)
Chloride: 101 mEq/L (ref 96–112)
Creatinine, Ser: 0.8 mg/dL (ref 0.50–1.10)
Creatinine, Ser: 0.8 mg/dL (ref 0.50–1.10)
Glucose, Bld: 127 mg/dL — ABNORMAL HIGH (ref 70–99)
Glucose, Bld: 201 mg/dL — ABNORMAL HIGH (ref 70–99)
HCT: 40 % (ref 36.0–46.0)
Hemoglobin: 13.6 g/dL (ref 12.0–15.0)
Hemoglobin: 12.9 g/dL (ref 12.0–15.0)
Potassium: 2.6 mEq/L — CL (ref 3.5–5.1)
Potassium: 2.9 mEq/L — ABNORMAL LOW (ref 3.5–5.1)
Sodium: 144 mEq/L (ref 135–145)
TCO2: 28 mmol/L (ref 0–100)
TCO2: 26 mmol/L (ref 0–100)

## 2010-11-05 LAB — DIFFERENTIAL
Basophils Absolute: 0 10*3/uL (ref 0.0–0.1)
Basophils Relative: 0 % (ref 0–1)
Eosinophils Absolute: 0.2 10*3/uL (ref 0.0–0.7)
Monocytes Relative: 6 % (ref 3–12)
Neutro Abs: 6.1 10*3/uL (ref 1.7–7.7)
Neutrophils Relative %: 66 % (ref 43–77)

## 2010-11-05 LAB — URINE MICROSCOPIC-ADD ON

## 2010-11-05 LAB — URINALYSIS, ROUTINE W REFLEX MICROSCOPIC
Glucose, UA: NEGATIVE mg/dL
Ketones, ur: NEGATIVE mg/dL
Nitrite: POSITIVE — AB
pH: 6.5 (ref 5.0–8.0)

## 2010-11-05 LAB — GLUCOSE, CAPILLARY: Glucose-Capillary: 146 mg/dL — ABNORMAL HIGH (ref 70–99)

## 2010-11-05 LAB — CBC
Hemoglobin: 13 g/dL (ref 12.0–15.0)
MCH: 29.8 pg (ref 26.0–34.0)
Platelets: 276 10*3/uL (ref 150–400)
RBC: 4.36 MIL/uL (ref 3.87–5.11)
WBC: 9.3 10*3/uL (ref 4.0–10.5)

## 2011-01-25 ENCOUNTER — Emergency Department (HOSPITAL_COMMUNITY)
Admission: EM | Admit: 2011-01-25 | Discharge: 2011-01-25 | Disposition: A | Discharge status: Home / Self Care | Payer: BC Managed Care – PPO | Attending: Emergency Medicine | Admitting: Emergency Medicine

## 2011-01-25 ENCOUNTER — Emergency Department (HOSPITAL_COMMUNITY): Payer: BC Managed Care – PPO

## 2011-01-25 DIAGNOSIS — R630 Anorexia: Secondary | ICD-10-CM | POA: Insufficient documentation

## 2011-01-25 DIAGNOSIS — Z79899 Other long term (current) drug therapy: Secondary | ICD-10-CM | POA: Insufficient documentation

## 2011-01-25 DIAGNOSIS — M549 Dorsalgia, unspecified: Secondary | ICD-10-CM | POA: Insufficient documentation

## 2011-01-25 DIAGNOSIS — J3489 Other specified disorders of nose and nasal sinuses: Secondary | ICD-10-CM | POA: Insufficient documentation

## 2011-01-25 DIAGNOSIS — N390 Urinary tract infection, site not specified: Secondary | ICD-10-CM | POA: Insufficient documentation

## 2011-01-25 DIAGNOSIS — R5381 Other malaise: Secondary | ICD-10-CM | POA: Insufficient documentation

## 2011-01-25 DIAGNOSIS — N949 Unspecified condition associated with female genital organs and menstrual cycle: Secondary | ICD-10-CM | POA: Insufficient documentation

## 2011-01-25 DIAGNOSIS — R0982 Postnasal drip: Secondary | ICD-10-CM | POA: Insufficient documentation

## 2011-01-25 DIAGNOSIS — I10 Essential (primary) hypertension: Secondary | ICD-10-CM | POA: Insufficient documentation

## 2011-01-25 DIAGNOSIS — Z7982 Long term (current) use of aspirin: Secondary | ICD-10-CM | POA: Insufficient documentation

## 2011-01-25 DIAGNOSIS — R51 Headache: Secondary | ICD-10-CM | POA: Insufficient documentation

## 2011-01-25 DIAGNOSIS — E119 Type 2 diabetes mellitus without complications: Secondary | ICD-10-CM | POA: Insufficient documentation

## 2011-01-25 DIAGNOSIS — R070 Pain in throat: Secondary | ICD-10-CM | POA: Insufficient documentation

## 2011-01-25 DIAGNOSIS — IMO0001 Reserved for inherently not codable concepts without codable children: Secondary | ICD-10-CM | POA: Insufficient documentation

## 2011-01-25 DIAGNOSIS — R509 Fever, unspecified: Secondary | ICD-10-CM | POA: Insufficient documentation

## 2011-01-25 DIAGNOSIS — R112 Nausea with vomiting, unspecified: Secondary | ICD-10-CM | POA: Insufficient documentation

## 2011-01-25 DIAGNOSIS — J069 Acute upper respiratory infection, unspecified: Secondary | ICD-10-CM | POA: Insufficient documentation

## 2011-01-25 DIAGNOSIS — R6883 Chills (without fever): Secondary | ICD-10-CM | POA: Insufficient documentation

## 2011-01-25 DIAGNOSIS — R109 Unspecified abdominal pain: Secondary | ICD-10-CM | POA: Insufficient documentation

## 2011-01-25 LAB — COMPREHENSIVE METABOLIC PANEL
Albumin: 3.7 g/dL (ref 3.5–5.2)
Alkaline Phosphatase: 103 U/L (ref 39–117)
BUN: 15 mg/dL (ref 6–23)
CO2: 28 mEq/L (ref 19–32)
Chloride: 100 mEq/L (ref 96–112)
GFR calc non Af Amer: 72 mL/min — ABNORMAL LOW (ref >90)
Potassium: 3.5 mEq/L (ref 3.5–5.1)
Total Bilirubin: 0.7 mg/dL (ref 0.3–1.2)

## 2011-01-25 LAB — URINALYSIS, ROUTINE W REFLEX MICROSCOPIC
Bilirubin Urine: NEGATIVE
Glucose, UA: NEGATIVE mg/dL
Glucose, UA: NEGATIVE mg/dL
Ketones, ur: NEGATIVE mg/dL
Ketones, ur: NEGATIVE mg/dL
Protein, ur: NEGATIVE mg/dL
Protein, ur: NEGATIVE mg/dL
pH: 6 (ref 5.0–8.0)

## 2011-01-25 LAB — DIFFERENTIAL
Basophils Absolute: 0 10*3/uL (ref 0.0–0.1)
Lymphocytes Relative: 5 % — ABNORMAL LOW (ref 12–46)
Lymphs Abs: 0.5 10*3/uL — ABNORMAL LOW (ref 0.7–4.0)
Neutro Abs: 8.6 10*3/uL — ABNORMAL HIGH (ref 1.7–7.7)
Neutrophils Relative %: 88 % — ABNORMAL HIGH (ref 43–77)

## 2011-01-25 LAB — URINE MICROSCOPIC-ADD ON

## 2011-01-25 LAB — CBC
HCT: 41.2 % (ref 36.0–46.0)
Hemoglobin: 13.9 g/dL (ref 12.0–15.0)
RBC: 4.65 MIL/uL (ref 3.87–5.11)
RDW: 13.9 % (ref 11.5–15.5)
WBC: 9.7 10*3/uL (ref 4.0–10.5)

## 2011-01-29 LAB — URINE CULTURE

## 2011-03-16 ENCOUNTER — Encounter: Payer: Self-pay | Admitting: Emergency Medicine

## 2011-03-16 ENCOUNTER — Emergency Department (HOSPITAL_COMMUNITY): Payer: BC Managed Care – PPO

## 2011-03-16 ENCOUNTER — Other Ambulatory Visit: Payer: Self-pay

## 2011-03-16 ENCOUNTER — Emergency Department (HOSPITAL_COMMUNITY)
Admission: EM | Admit: 2011-03-16 | Discharge: 2011-03-16 | Disposition: A | Discharge status: Home / Self Care | Payer: BC Managed Care – PPO | Attending: Emergency Medicine | Admitting: Emergency Medicine

## 2011-03-16 DIAGNOSIS — R0982 Postnasal drip: Secondary | ICD-10-CM | POA: Insufficient documentation

## 2011-03-16 DIAGNOSIS — R61 Generalized hyperhidrosis: Secondary | ICD-10-CM | POA: Insufficient documentation

## 2011-03-16 DIAGNOSIS — IMO0001 Reserved for inherently not codable concepts without codable children: Secondary | ICD-10-CM | POA: Insufficient documentation

## 2011-03-16 DIAGNOSIS — J3489 Other specified disorders of nose and nasal sinuses: Secondary | ICD-10-CM | POA: Insufficient documentation

## 2011-03-16 DIAGNOSIS — J40 Bronchitis, not specified as acute or chronic: Secondary | ICD-10-CM

## 2011-03-16 DIAGNOSIS — R0789 Other chest pain: Secondary | ICD-10-CM | POA: Insufficient documentation

## 2011-03-16 DIAGNOSIS — E119 Type 2 diabetes mellitus without complications: Secondary | ICD-10-CM | POA: Insufficient documentation

## 2011-03-16 DIAGNOSIS — R6883 Chills (without fever): Secondary | ICD-10-CM | POA: Insufficient documentation

## 2011-03-16 DIAGNOSIS — R059 Cough, unspecified: Secondary | ICD-10-CM | POA: Insufficient documentation

## 2011-03-16 DIAGNOSIS — I10 Essential (primary) hypertension: Secondary | ICD-10-CM | POA: Insufficient documentation

## 2011-03-16 DIAGNOSIS — Z79899 Other long term (current) drug therapy: Secondary | ICD-10-CM | POA: Insufficient documentation

## 2011-03-16 DIAGNOSIS — R0602 Shortness of breath: Secondary | ICD-10-CM | POA: Insufficient documentation

## 2011-03-16 DIAGNOSIS — R062 Wheezing: Secondary | ICD-10-CM | POA: Insufficient documentation

## 2011-03-16 DIAGNOSIS — R05 Cough: Secondary | ICD-10-CM | POA: Insufficient documentation

## 2011-03-16 HISTORY — DX: Essential (primary) hypertension: I10

## 2011-03-16 LAB — POCT I-STAT, CHEM 8
Calcium, Ion: 1.1 mmol/L — ABNORMAL LOW (ref 1.12–1.32)
Glucose, Bld: 165 mg/dL — ABNORMAL HIGH (ref 70–99)
HCT: 43 % (ref 36.0–46.0)
Hemoglobin: 14.6 g/dL (ref 12.0–15.0)

## 2011-03-16 MED ORDER — ALBUTEROL SULFATE HFA 108 (90 BASE) MCG/ACT IN AERS
2.0000 | INHALATION_SPRAY | RESPIRATORY_TRACT | Status: DC
  Administered 2011-03-16: 2 via RESPIRATORY_TRACT
  Filled 2011-03-16: qty 6.7

## 2011-03-16 MED ORDER — ALBUTEROL SULFATE (5 MG/ML) 0.5% IN NEBU
2.5000 mg | INHALATION_SOLUTION | Freq: Once | RESPIRATORY_TRACT | Status: AC
Start: 2011-03-16 — End: 2011-03-16
  Administered 2011-03-16: 2.5 mg via RESPIRATORY_TRACT
  Filled 2011-03-16: qty 0.5

## 2011-03-16 MED ORDER — HYDROCOD POLST-CHLORPHEN POLST 10-8 MG/5ML PO LQCR: 5.0000 mL | Freq: Every evening | ORAL | Status: DC | PRN

## 2011-03-16 MED ORDER — POTASSIUM CHLORIDE CRYS ER 20 MEQ PO TBCR
40.0000 meq | EXTENDED_RELEASE_TABLET | Freq: Once | ORAL | Status: AC
  Administered 2011-03-16: 40 meq via ORAL
  Filled 2011-03-16: qty 2

## 2011-03-16 MED ORDER — IPRATROPIUM BROMIDE 0.02 % IN SOLN
0.5000 mg | Freq: Once | RESPIRATORY_TRACT | Status: AC
  Administered 2011-03-16: 0.5 mg via RESPIRATORY_TRACT
  Filled 2011-03-16: qty 2.5

## 2011-03-16 NOTE — ED Notes (Signed)
Family at bedside. 

## 2011-03-16 NOTE — ED Provider Notes (Signed)
History     CSN: [id number] Arrival date & time: 03/16/2011  6:25 AM   First MD Initiated Contact with Patient 03/16/11 [phone]      Chief Complaint  Patient presents with  . Cough  . Shortness of Breath    (Consider location/radiation/quality/duration/timing/severity/associated sxs/prior treatment) Patient is a 50 y.o. Mcconnell presenting with cough.  Cough This is a new problem. The current episode started more than 2 days ago. The problem occurs constantly. The problem has been gradually worsening. The cough is productive of purulent sputum. There has been no fever. Associated symptoms include chills, sweats, myalgias, shortness of breath and wheezing. Pertinent negatives include no ear congestion, no ear pain and no headaches. She has tried nothing for the symptoms. She is not a smoker.  Pt reports symptoms have been there for about a week. Went to her primary care doctor, was told it was "viral URI." Pt states everything improving except for cough. Last night states became short of breath, had chest tightness, and started to wheeze. Did not take any medications.   Past Medical History  Diagnosis Date  . Diabetes mellitus   . Hypertension     History reviewed. No pertinent past surgical history.  History reviewed. No pertinent family history.  History  Substance Use Topics  . Smoking status: Not on file  . Smokeless tobacco: Not on file  . Alcohol Use:     OB History    Grav Para Term Preterm Abortions TAB SAB Ect Mult Living                  Review of Systems  Constitutional: Positive for chills. Negative for fever.  HENT: Positive for congestion and postnasal drip. Negative for ear pain.   Eyes: Negative.   Respiratory: Positive for cough, chest tightness, shortness of breath and wheezing.   Cardiovascular: Negative for palpitations and leg swelling.  Gastrointestinal: Negative.   Genitourinary: Negative.   Musculoskeletal: Positive for myalgias.  Skin: Negative.    Neurological: Negative.  Negative for headaches.  Psychiatric/Behavioral: Negative.     Allergies  Sulfa drugs cross reactors and Benicar  Home Medications   Current Outpatient Rx  Name Route Sig Dispense Refill  . ATENOLOL 25 MG PO TABS Oral Take 25 mg by mouth 2 (two) times daily.      . CORICIDIN HBP FLU PO Oral Take 1 tablet by mouth every 6 (six) hours as needed. For cold symptoms     . FUROSEMIDE 40 MG PO TABS Oral Take 80 mg by mouth daily.      Marland Kitchen HYDROXYZINE HCL 50 MG PO TABS Oral Take 50 mg by mouth 4 (four) times daily as needed. For itching      . METFORMIN HCL 1000 MG PO TABS Oral Take 1,000 mg by mouth 2 (two) times daily with a meal.      . POTASSIUM CHLORIDE CRYS CR 20 MEQ PO TBCR Oral Take 40 mEq by mouth 2 (two) times daily.        BP 120/81  Pulse 70  Temp(Src) 99 F (37.2 C) (Oral)  Resp 17  SpO2 98%  Physical Exam  Constitutional: She is oriented to person, place, and time. She appears well-developed and well-nourished. No distress.  HENT:  Head: Normocephalic and atraumatic.  Right Ear: Tympanic membrane, external ear and ear canal normal.  Left Ear: Tympanic membrane, external ear and ear canal normal.  Nose: Rhinorrhea present.  Mouth/Throat: Uvula is midline, oropharynx is clear and  moist and mucous membranes are normal. No posterior oropharyngeal erythema or tonsillar abscesses.  Cardiovascular: Normal rate, regular rhythm and normal heart sounds.   Pulmonary/Chest: Effort normal and breath sounds normal. No respiratory distress. She has no wheezes. She has no rales.  Abdominal: Soft. Bowel sounds are normal. She exhibits no distension. There is no tenderness.  Musculoskeletal: She exhibits no edema and no tenderness.  Neurological: She is alert and oriented to person, place, and time.  Skin: Skin is warm and dry. No rash noted.  Psychiatric: She has a normal mood and affect.    ED Course  Procedures (including critical care time)  Labs  Reviewed  POCT I-STAT, CHEM 8 - Abnormal; Notable for the following:    Potassium 3.1 (*)    Glucose, Bld 165 (*)    Calcium, Ion 1.10 (*)    All other components within normal limits  POCT I-STAT TROPONIN I  I-STAT, CHEM 8  I-STAT TROPONIN I   Dg Chest 2 View  03/16/2011  *RADIOLOGY REPORT*  Clinical Data: Cough.  Shortness of breath.  CHEST - 2 VIEW  Comparison:  None.  Findings:  The heart size and mediastinal contours are within normal limits.  Both lungs are clear.  The visualized skeletal structures are unremarkable.  IMPRESSION: No active cardiopulmonary disease.  Original Report Authenticated By: Danae Orleans, M.D.   Pt with URI symptoms for one week, improving except for cough, now shortness of breath, wheezing per pt. No wheezing heard on exam. Pt's oxygen sat 95, neb treatment given, rechecked, 100% on RA now. Pt states her breathing improve das well. ECG unremarkable. CXR negative. One set of troponin negative. Doubt cardiac. Will treat for bronchitits with follow up   Date: 03/16/2011  Rate:68  Rhythm: normal sinus rhythm  QRS Axis: left  Intervals: normal  ST/T Wave abnormalities: normal  Conduction Disutrbances:left anterior fascicular block  Narrative Interpretation:   Old EKG Reviewed: unchanged   No diagnosis found.    MDM          Lottie Mussel, PA 03/16/11 [phone]

## 2011-03-16 NOTE — ED Provider Notes (Signed)
Medical screening examination/treatment/procedure(s) were performed by non-physician practitioner and as supervising physician I was immediately available for consultation/collaboration.   Nat Christen, MD 03/16/11 1700

## 2011-03-16 NOTE — ED Notes (Signed)
Pt was home and called EMS due to cough and SOB that started last week and she went to her PCP. Pt was told she has a "virus". Pt now has sore throat, cough, SOB and vomiting x1. EMS notes no distress.

## 2011-06-04 ENCOUNTER — Emergency Department (HOSPITAL_COMMUNITY)
Admission: EM | Admit: 2011-06-04 | Discharge: 2011-06-04 | Disposition: A | Discharge status: Home / Self Care | Payer: BC Managed Care – PPO | Attending: Emergency Medicine | Admitting: Emergency Medicine

## 2011-06-04 ENCOUNTER — Encounter (HOSPITAL_COMMUNITY): Payer: Self-pay | Admitting: *Deleted

## 2011-06-04 DIAGNOSIS — E119 Type 2 diabetes mellitus without complications: Secondary | ICD-10-CM | POA: Insufficient documentation

## 2011-06-04 DIAGNOSIS — L089 Local infection of the skin and subcutaneous tissue, unspecified: Secondary | ICD-10-CM | POA: Insufficient documentation

## 2011-06-04 DIAGNOSIS — S70362A Insect bite (nonvenomous), left thigh, initial encounter: Secondary | ICD-10-CM

## 2011-06-04 DIAGNOSIS — I10 Essential (primary) hypertension: Secondary | ICD-10-CM | POA: Insufficient documentation

## 2011-06-04 DIAGNOSIS — Y92009 Unspecified place in unspecified non-institutional (private) residence as the place of occurrence of the external cause: Secondary | ICD-10-CM | POA: Insufficient documentation

## 2011-06-04 NOTE — Discharge Instructions (Signed)
Please read instructions below. You were seen in the emergency department tonight for your concern about swelling and possible worsening infection of insect bitesto your left upper thigh. These wounds appear to be healing nicely without any obvious signs of infection. The tissue around the wounds is soft without any redness or significant swelling. Continue your antibiotic and follow up with Dr. Madilyn Hook as needed.  Insect Bite Mosquitoes, flies, fleas, bedbugs, and other insects can bite. Insect bites are different from insect stings. The bite may be red, puffy (swollen), and itchy for 2 to 4 days. Most bites get better on their own. HOME CARE   Do not scratch the bite.   Keep the bite clean and dry. Wash the bite with soap and water.   Put ice on the bite.   Put ice in a plastic bag.   Place a towel between your skin and the bag.   Leave the ice on for 20 minutes, 4 times a day. Do this for the first 2 to 3 days, or as told by your doctor.   You may use medicated lotions or creams to lessen itching as told by your doctor.   Only take medicines as told by your doctor.   If you are given medicines (antibiotics), take them as told. Finish them even if you start to feel better.  You may need a tetanus shot if:  You cannot remember when you had your last tetanus shot.   You have never had a tetanus shot.   The injury broke your skin.  If you need a tetanus shot and you choose not to have one, you may get tetanus. Sickness from tetanus can be serious. GET HELP RIGHT AWAY IF:   You have more pain, redness, or puffiness.   You see a red line on the skin coming from the bite.   You have a fever.   You have joint pain.   You have a headache or neck pain.   You feel weak.   You have a rash.   You have chest pain, or you are short of breath.   You have belly (abdominal) pain.   You feel sick to your stomach (nauseous) or throw up (vomit).   You feel very tired or sleepy.  MAKE  SURE YOU:   Understand these instructions.   Will watch your condition.   Will get help right away if you are not doing well or get worse.  Document Released: 03/12/2000 Document Revised: 03/04/2011 Document Reviewed: 10/14/2010 [facility] Patient Information 2012 [place], [place].

## 2011-06-04 NOTE — ED Notes (Signed)
The pt says she was bitten by something to the lt upper thigh on Monday.  shesaw her doctor on Tuesday and she was given keflex but the leg is more swollen and painful today

## 2011-06-04 NOTE — ED Provider Notes (Signed)
History     CSN: [id number]  Arrival date & time 06/04/11  2023   First MD Initiated Contact with Patient 06/04/11 2309      Chief Complaint  Patient presents with  . Leg Swelling     The history is provided by the patient.  Patient reports that on Tuesday nausea and her husband were going through some clutter inside of her home she was bitten by something, unsure what. The areas became swollen and red with red swollen tissue around each of the puncture type wounds. She saw her primary care physician on Wednesday and was started on Keflex. She presents tonight because she is concerned that the left upper thigh is swollen, and is worried the infection is worsening.  Past Medical History  Diagnosis Date  . Diabetes mellitus   . Hypertension     History reviewed. No pertinent past surgical history.  History reviewed. No pertinent family history.  History  Substance Use Topics  . Smoking status: Never Smoker   . Smokeless tobacco: Not on file  . Alcohol Use: Yes    OB History    Grav Para Term Preterm Abortions TAB SAB Ect Mult Living                  Review of Systems  Constitutional: Negative.   HENT: Negative.   Eyes: Negative.   Respiratory: Negative.   Cardiovascular: Negative.   Gastrointestinal: Negative.   Genitourinary: Negative.   Musculoskeletal: Negative.   Skin: Negative.   Neurological: Negative.   Hematological: Negative.   Psychiatric/Behavioral: Negative.     Allergies  Codeine; Sulfa drugs cross reactors; and Benicar  Home Medications   Current Outpatient Rx  Name Route Sig Dispense Refill  . ASPIRIN 325 MG PO TABS Oral Take 325 mg by mouth daily.    . ATENOLOL 25 MG PO TABS Oral Take 25 mg by mouth 2 (two) times daily.      . CEPHALEXIN 500 MG PO CAPS Oral Take 500 mg by mouth 2 (two) times daily.    . FUROSEMIDE 40 MG PO TABS Oral Take 80 mg by mouth 2 (two) times daily.     Marland Kitchen METFORMIN HCL 1000 MG PO TABS Oral Take 1,000 mg by mouth 2  (two) times daily with a meal.      . POTASSIUM CHLORIDE CRYS ER 20 MEQ PO TBCR Oral Take 40 mEq by mouth 2 (two) times daily.        BP 136/75  Pulse 72  Temp(Src) 98.1 F (36.7 C) (Oral)  Resp 18  SpO2 99%  Physical Exam  Constitutional: She is oriented to person, place, and time. She appears well-developed and well-nourished.  HENT:  Head: Normocephalic and atraumatic.  Eyes: Conjunctivae are normal.  Cardiovascular: Normal rate.   Pulmonary/Chest: Effort normal.  Abdominal: Soft.  Musculoskeletal: Normal range of motion.  Neurological: She is alert and oriented to person, place, and time.  Skin: Skin is warm and dry. No erythema.       Patient has 2 small symmetric puncture type wounds to the left upper thigh (c/w insect bites).Tthe 2 wounds appear to be healing without overt signs of infection. Tissue surrounding the 2 puncture wounds is soft without erythema or induration. The left thigh measures 27 inches as does the right upper thigh, so no significant swelling noted.  Psychiatric: She has a normal mood and affect.    ED Course  Procedures   Findings and clinical impression discussed with  patient. Patient encouraged to continue her antibiotic until it is completed and to followup with primary care physician as needed  Labs Reviewed - No data to display No results found.   1. Insect bite of thigh, left       MDM  HPI/PE and clinical findings c/w 1. Resolving wound infection (2 puncture wounds consistent with insect bites, appear to be healing well, without swelling, erythema or other overt signs of infection)        Leanne Chang, NP 06/04/11 2335  Medical screening examination/treatment/procedure(s) were performed by non-physician practitioner and as supervising physician I was immediately available for consultation/collaboration.  Sunnie Nielsen, MD 06/05/11 [phone]

## 2011-12-08 ENCOUNTER — Ambulatory Visit: Payer: Self-pay

## 2011-12-08 ENCOUNTER — Other Ambulatory Visit: Payer: Self-pay | Admitting: Occupational Medicine

## 2011-12-08 DIAGNOSIS — R52 Pain, unspecified: Secondary | ICD-10-CM

## 2012-01-10 ENCOUNTER — Encounter (HOSPITAL_COMMUNITY): Payer: Self-pay

## 2012-01-10 ENCOUNTER — Emergency Department (HOSPITAL_COMMUNITY): Payer: BC Managed Care – PPO

## 2012-01-10 ENCOUNTER — Emergency Department (HOSPITAL_COMMUNITY)
Admission: EM | Admit: 2012-01-10 | Discharge: 2012-01-10 | Disposition: A | Discharge status: Home / Self Care | Payer: BC Managed Care – PPO | Attending: Emergency Medicine | Admitting: Emergency Medicine

## 2012-01-10 DIAGNOSIS — M25561 Pain in right knee: Secondary | ICD-10-CM

## 2012-01-10 DIAGNOSIS — Z882 Allergy status to sulfonamides status: Secondary | ICD-10-CM | POA: Insufficient documentation

## 2012-01-10 DIAGNOSIS — E119 Type 2 diabetes mellitus without complications: Secondary | ICD-10-CM | POA: Insufficient documentation

## 2012-01-10 DIAGNOSIS — M25569 Pain in unspecified knee: Secondary | ICD-10-CM | POA: Insufficient documentation

## 2012-01-10 DIAGNOSIS — Z886 Allergy status to analgesic agent status: Secondary | ICD-10-CM | POA: Insufficient documentation

## 2012-01-10 DIAGNOSIS — I10 Essential (primary) hypertension: Secondary | ICD-10-CM | POA: Insufficient documentation

## 2012-01-10 MED ORDER — TRAMADOL HCL 50 MG PO TABS
50.0000 mg | ORAL_TABLET | Freq: Once | ORAL | Status: AC
  Administered 2012-01-10: 50 mg via ORAL
  Filled 2012-01-10: qty 1

## 2012-01-10 MED ORDER — TRAMADOL HCL 50 MG PO TABS: 50.0000 mg | ORAL_TABLET | Freq: Four times a day (QID) | ORAL | Status: DC | PRN

## 2012-01-10 NOTE — ED Notes (Signed)
Pt to radiology.

## 2012-01-10 NOTE — Progress Notes (Signed)
Orthopedic Tech Progress Note Patient Details:  [name] 04/24/1960 [id number] Knee Sleeve applied to Right LE. Application tolerated well. Instruction given.  Ortho Devices Type of Ortho Device: Knee Sleeve Ortho Device/Splint Location: Right UE Ortho Device/Splint Interventions: Application   Asia R Thompson 01/10/2012, 9:23 AM

## 2012-01-10 NOTE — ED Notes (Signed)
Pt asked to change into gown. 

## 2012-01-10 NOTE — ED Provider Notes (Signed)
History     CSN: [id number]  Arrival date & time 01/10/12  0709   First MD Initiated Contact with Patient 01/10/12 [phone]      Chief Complaint  Patient presents with  . Knee Pain    (Consider location/radiation/quality/duration/timing/severity/associated sxs/prior treatment) HPI  [name] is a 51 y.o. female complaining of right knee pain worsening over the course of 3 weeks. Pain is rated at 10 out of 10, exacerbated by weightbearing. She denies any specific injury but states she may have twisted it. No history of trauma or surgeries to this knee.  Past Medical History  Diagnosis Date  . Diabetes mellitus   . Hypertension     Past Surgical History  Procedure Date  . Colon surgery   . Abdominal hysterectomy   . Tonsillectomy   . Knee arthroscopy     No family history on file.  History  Substance Use Topics  . Smoking status: Never Smoker   . Smokeless tobacco: Not on file  . Alcohol Use: No    OB History    Grav Para Term Preterm Abortions TAB SAB Ect Mult Living                  Review of Systems  Constitutional: Negative for fever.  Respiratory: Negative for shortness of breath.   Cardiovascular: Negative for chest pain.  Gastrointestinal: Negative for nausea, vomiting, abdominal pain and diarrhea.  Musculoskeletal: Positive for arthralgias.  All other systems reviewed and are negative.    Allergies  Codeine; Sulfa drugs cross reactors; and Benicar  Home Medications   Current Outpatient Rx  Name Route Sig Dispense Refill  . ASPIRIN 325 MG PO TABS Oral Take 325 mg by mouth daily.    . ATENOLOL 25 MG PO TABS Oral Take 25 mg by mouth 2 (two) times daily.      . CEPHALEXIN 500 MG PO CAPS Oral Take 500 mg by mouth 2 (two) times daily.    . FUROSEMIDE 40 MG PO TABS Oral Take 80 mg by mouth 2 (two) times daily.     Marland Kitchen METFORMIN HCL 1000 MG PO TABS Oral Take 1,000 mg by mouth 2 (two) times daily with a meal.      . POTASSIUM CHLORIDE CRYS ER 20 MEQ  PO TBCR Oral Take 40 mEq by mouth 2 (two) times daily.        There were no vitals taken for this visit.  Physical Exam  Nursing note and vitals reviewed. Constitutional: She is oriented to person, place, and time. She appears well-developed and well-nourished. No distress.  HENT:  Head: Normocephalic.  Eyes: Conjunctivae normal and EOM are normal.  Cardiovascular: Normal rate.   Pulmonary/Chest: Effort normal. No stridor.  Musculoskeletal: Normal range of motion.       Right knee:  No deformity, erythema or abrasions. FROM. No effusion or crepitance. Anterior and posterior drawer show no abnormal laxity. Stable to valgus and varus stress. Tenderness to palpation of the medial joint line.  Neurovascularly intact. Pt ambulates with an antalgic gait.   Neurological: She is alert and oriented to person, place, and time.  Psychiatric: She has a normal mood and affect.    ED Course  Procedures (including critical care time)  Labs Reviewed - No data to display Dg Knee Complete 4 Views Right  01/10/2012  *RADIOLOGY REPORT*  Clinical Data: Twisting right knee injury, pain for 3 weeks.  RIGHT KNEE - COMPLETE 4+ VIEW  Comparison: None.  Findings: Mild tricompartmental degenerative change noted.  Lucency at the undersurface of the patella is noted which may be due to obliquity but could represent osteochondral defect.  No fracture or dislocation.  No joint effusion.  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  Consider MRI for further evaluation if symptoms persist.   Original Report Authenticated By: Harrel Lemon, M.D.      1. Arthralgia of knee, right       MDM  Degenerative changes seen on x-ray there is a lucency in the undersurface of the patella patella but may represent a osteochondral defects. Patient will follow with the orthopedist for further evaluation. She will be given a knee sleeve and pain control in the ED  today.  New Prescriptions   TRAMADOL (ULTRAM) 50 MG TABLET    Take 1 tablet (50 mg total) by mouth every 6 (six) hours as needed for pain.          Wynetta Emery, PA-C 01/10/12 [phone]

## 2012-01-10 NOTE — ED Notes (Signed)
awaiting ortho

## 2012-01-10 NOTE — ED Provider Notes (Signed)
Medical screening examination/treatment/procedure(s) were performed by non-physician practitioner and as supervising physician I was immediately available for consultation/collaboration.  Harlym Gehling R. Terra Aveni, MD 01/10/12 1540 

## 2012-01-10 NOTE — ED Notes (Signed)
Pt c/o right knee pain ongoing x 3 weeks, worse this am.  Denies injury

## 2012-04-12 ENCOUNTER — Emergency Department (HOSPITAL_COMMUNITY): Payer: BC Managed Care – PPO

## 2012-04-12 ENCOUNTER — Emergency Department (HOSPITAL_COMMUNITY)
Admission: EM | Admit: 2012-04-12 | Discharge: 2012-04-13 | Disposition: A | Discharge status: Home / Self Care | Payer: BC Managed Care – PPO | Attending: Emergency Medicine | Admitting: Emergency Medicine

## 2012-04-12 ENCOUNTER — Encounter (HOSPITAL_COMMUNITY): Payer: Self-pay | Admitting: *Deleted

## 2012-04-12 DIAGNOSIS — I10 Essential (primary) hypertension: Secondary | ICD-10-CM | POA: Insufficient documentation

## 2012-04-12 DIAGNOSIS — R0789 Other chest pain: Secondary | ICD-10-CM | POA: Insufficient documentation

## 2012-04-12 DIAGNOSIS — R079 Chest pain, unspecified: Secondary | ICD-10-CM

## 2012-04-12 DIAGNOSIS — R059 Cough, unspecified: Secondary | ICD-10-CM | POA: Insufficient documentation

## 2012-04-12 DIAGNOSIS — E119 Type 2 diabetes mellitus without complications: Secondary | ICD-10-CM | POA: Insufficient documentation

## 2012-04-12 DIAGNOSIS — Z7982 Long term (current) use of aspirin: Secondary | ICD-10-CM | POA: Insufficient documentation

## 2012-04-12 DIAGNOSIS — Z79899 Other long term (current) drug therapy: Secondary | ICD-10-CM | POA: Insufficient documentation

## 2012-04-12 DIAGNOSIS — R05 Cough: Secondary | ICD-10-CM | POA: Insufficient documentation

## 2012-04-12 DIAGNOSIS — J3489 Other specified disorders of nose and nasal sinuses: Secondary | ICD-10-CM | POA: Insufficient documentation

## 2012-04-12 LAB — CBC
MCH: 29.4 pg (ref 26.0–34.0)
MCV: 89.5 fL (ref 78.0–100.0)
Platelets: 296 10*3/uL (ref 150–400)
RDW: 13.5 % (ref 11.5–15.5)

## 2012-04-12 LAB — BASIC METABOLIC PANEL
CO2: 23 mEq/L (ref 19–32)
Calcium: 9.3 mg/dL (ref 8.4–10.5)
Creatinine, Ser: 0.84 mg/dL (ref 0.50–1.10)
GFR calc Af Amer: >90 mL/min (ref >90)

## 2012-04-12 LAB — POCT I-STAT TROPONIN I: Troponin i, poc: 0 ng/mL (ref 0.00–0.08)

## 2012-04-12 MED ORDER — ALBUTEROL SULFATE (5 MG/ML) 0.5% IN NEBU
2.5000 mg | INHALATION_SOLUTION | Freq: Once | RESPIRATORY_TRACT | Status: AC
  Administered 2012-04-13: 2.5 mg via RESPIRATORY_TRACT
  Filled 2012-04-12: qty 20

## 2012-04-12 MED ORDER — ONDANSETRON HCL 4 MG/2ML IJ SOLN: 4.0000 mg | Freq: Once | INTRAMUSCULAR | Status: DC

## 2012-04-12 MED ORDER — HYDROCODONE-ACETAMINOPHEN 5-325 MG PO TABS
1.0000 | ORAL_TABLET | Freq: Once | ORAL | Status: AC
  Administered 2012-04-13: 1 via ORAL
  Filled 2012-04-12: qty 1

## 2012-04-12 NOTE — ED Notes (Signed)
Pt c/o tightness in chest since last night associated with left arm pain, cough, and nasal drainage.  Denies fever/chills.

## 2012-04-12 NOTE — ED Notes (Signed)
Pt presents to department for evaluation of intermittent chest pain since yesterday that radiates to her left arm.  Also complaining of a cough for the past week, was seen at PCP today and given a breathing treatment, sent home with an antibiotic and an inhaler.  Pt returned to ED due to concern about chest pain, placed on cardiac monitor, pt in NAD at this time.

## 2012-04-13 MED ORDER — ONDANSETRON 4 MG PO TBDP
4.0000 mg | ORAL_TABLET | Freq: Once | ORAL | Status: AC
  Administered 2012-04-13: 4 mg via ORAL
  Filled 2012-04-13: qty 1

## 2012-04-13 NOTE — ED Provider Notes (Addendum)
History     CSN: [id number]  Arrival date & time 04/12/12  2155   First MD Initiated Contact with Patient 04/12/12 2301      Chief Complaint  Patient presents with  . Chest Pain    (Consider location/radiation/quality/duration/timing/severity/associated sxs/prior treatment) Patient is a 52 y.o. female presenting with chest pain. The history is provided by the patient.  Chest Pain The chest pain began 12 - 24 hours ago. Chest pain occurs constantly. The chest pain is unchanged. The pain is associated with coughing. At its most intense, the pain is at 2/10. The pain is currently at 2/10. The severity of the pain is mild. The quality of the pain is described as aching. The pain radiates to the left arm. Primary symptoms include cough. She tried nothing for the symptoms.  Her past medical history is significant for diabetes.     Past Medical History  Diagnosis Date  . Diabetes mellitus   . Hypertension     Past Surgical History  Procedure Date  . Colon surgery   . Abdominal hysterectomy   . Tonsillectomy   . Knee arthroscopy     History reviewed. No pertinent family history.  History  Substance Use Topics  . Smoking status: Never Smoker   . Smokeless tobacco: Not on file  . Alcohol Use: No    OB History    Grav Para Term Preterm Abortions TAB SAB Ect Mult Living                  Review of Systems  Respiratory: Positive for cough.   Cardiovascular: Positive for chest pain.    Allergies  Codeine; Other; Sulfa drugs cross reactors; and Benicar  Home Medications   Current Outpatient Rx  Name  Route  Sig  Dispense  Refill  . ASPIRIN 325 MG PO TABS   Oral   Take 325 mg by mouth daily.         . ATENOLOL 25 MG PO TABS   Oral   Take 25 mg by mouth 2 (two) times daily.           . FUROSEMIDE 40 MG PO TABS   Oral   Take 80 mg by mouth 2 (two) times daily.          . IBUPROFEN 200 MG PO TABS   Oral   Take 800 mg by mouth every 6 (six) hours as  needed. For pain         . MELOXICAM 15 MG PO TABS   Oral   Take 15 mg by mouth daily.         Marland Kitchen METFORMIN HCL 1000 MG PO TABS   Oral   Take 1,000 mg by mouth 2 (two) times daily with a meal.           . POTASSIUM CHLORIDE CRYS ER 20 MEQ PO TBCR   Oral   Take 40 mEq by mouth 2 (two) times daily.             BP 141/67  Pulse 63  Temp 97.8 F (36.6 C)  Resp 20  SpO2 99%  Physical Exam  Constitutional: She is oriented to person, place, and time. She appears well-developed and well-nourished.  HENT:  Head: Normocephalic and atraumatic.  Eyes: Conjunctivae and EOM are normal. Pupils are equal, round, and reactive to light.  Neck: Normal range of motion.  Cardiovascular: Normal rate, regular rhythm and normal heart sounds.   Pulmonary/Chest: Effort normal  and breath sounds normal.  Abdominal: Soft. Bowel sounds are normal.  Musculoskeletal: Normal range of motion.  Neurological: She is alert and oriented to person, place, and time.  Skin: Skin is warm and dry.  Psychiatric: She has a normal mood and affect. Her behavior is normal.    ED Course  Procedures (including critical care time)  Labs Reviewed  BASIC METABOLIC PANEL - Abnormal; Notable for the following:    Glucose, Bld 145 (*)     GFR calc non Af Amer 80 (*)     All other components within normal limits  CBC  POCT I-STAT TROPONIN I   Dg Chest 2 View  04/12/2012  *RADIOLOGY REPORT*  Clinical Data: Chest tightness, left arm pain, cough, nasal drainage.  CHEST - 2 VIEW  Comparison: 03/16/2011  Findings: Slightly shallow inspiration. The heart size and pulmonary vascularity are normal. The lungs appear clear and expanded without focal air space disease or consolidation. No blunting of the costophrenic angles.  No pneumothorax.  Mediastinal contours appear intact.  Mild degenerative changes in the spine. No significant change since previous study.  IMPRESSION: No evidence of active pulmonary disease.   Original  Report Authenticated By: Burman Nieves, M.D.      No diagnosis found.   Date: 04/13/2012  Rate: 65  Rhythm: normal sinus rhythm  QRS Axis: normal  Intervals: normal  ST/T Wave abnormalities: nonspecific ST changes and nonspecific T wave changes  Conduction Disutrbances:left anterior fascicular block  Narrative Interpretation:   Old EKG Reviewed: unchanged    MDM  + cp constant x 20 hrs.  Associated with cough and nasal drainage.  No leg swelling, no tachycardia.  Unlikely PE.  ce neg.  cxr neg.  Will Dc with analgesia to fu with pmd,  Ret new/worseing sxs.  Given abx and ibr by pmd.         Rosanne Ashing, MD 04/13/12 1914  Rosanne Ashing, MD 05/09/12 [phone]

## 2012-05-16 ENCOUNTER — Encounter (HOSPITAL_COMMUNITY): Payer: Self-pay | Admitting: Neurology

## 2012-05-16 ENCOUNTER — Emergency Department (HOSPITAL_COMMUNITY)
Admission: EM | Admit: 2012-05-16 | Discharge: 2012-05-16 | Disposition: A | Discharge status: Home / Self Care | Payer: BC Managed Care – PPO | Attending: Emergency Medicine | Admitting: Emergency Medicine

## 2012-05-16 ENCOUNTER — Emergency Department (HOSPITAL_COMMUNITY): Payer: BC Managed Care – PPO

## 2012-05-16 DIAGNOSIS — Z791 Long term (current) use of non-steroidal anti-inflammatories (NSAID): Secondary | ICD-10-CM | POA: Insufficient documentation

## 2012-05-16 DIAGNOSIS — R112 Nausea with vomiting, unspecified: Secondary | ICD-10-CM | POA: Insufficient documentation

## 2012-05-16 DIAGNOSIS — N390 Urinary tract infection, site not specified: Secondary | ICD-10-CM | POA: Insufficient documentation

## 2012-05-16 DIAGNOSIS — R630 Anorexia: Secondary | ICD-10-CM | POA: Insufficient documentation

## 2012-05-16 DIAGNOSIS — Z7982 Long term (current) use of aspirin: Secondary | ICD-10-CM | POA: Insufficient documentation

## 2012-05-16 DIAGNOSIS — E119 Type 2 diabetes mellitus without complications: Secondary | ICD-10-CM | POA: Insufficient documentation

## 2012-05-16 DIAGNOSIS — I10 Essential (primary) hypertension: Secondary | ICD-10-CM | POA: Insufficient documentation

## 2012-05-16 DIAGNOSIS — Z79899 Other long term (current) drug therapy: Secondary | ICD-10-CM | POA: Insufficient documentation

## 2012-05-16 DIAGNOSIS — R197 Diarrhea, unspecified: Secondary | ICD-10-CM | POA: Insufficient documentation

## 2012-05-16 LAB — COMPREHENSIVE METABOLIC PANEL
ALT: 15 U/L (ref 0–35)
Alkaline Phosphatase: 97 U/L (ref 39–117)
BUN: 9 mg/dL (ref 6–23)
CO2: 21 mEq/L (ref 19–32)
Calcium: 9.2 mg/dL (ref 8.4–10.5)
GFR calc Af Amer: >90 mL/min (ref >90)
GFR calc non Af Amer: >90 mL/min (ref >90)
Glucose, Bld: 135 mg/dL — ABNORMAL HIGH (ref 70–99)
Potassium: 3.6 mEq/L (ref 3.5–5.1)
Sodium: 139 mEq/L (ref 135–145)

## 2012-05-16 LAB — CBC WITH DIFFERENTIAL/PLATELET
Eosinophils Relative: 3 % (ref 0–5)
HCT: 40.5 % (ref 36.0–46.0)
Hemoglobin: 13.5 g/dL (ref 12.0–15.0)
Lymphocytes Relative: 26 % (ref 12–46)
Lymphs Abs: 2.2 10*3/uL (ref 0.7–4.0)
MCV: 89 fL (ref 78.0–100.0)
Monocytes Relative: 5 % (ref 3–12)
Platelets: 282 10*3/uL (ref 150–400)
RBC: 4.55 MIL/uL (ref 3.87–5.11)
WBC: 8.7 10*3/uL (ref 4.0–10.5)

## 2012-05-16 LAB — URINALYSIS, ROUTINE W REFLEX MICROSCOPIC
Bilirubin Urine: NEGATIVE
Ketones, ur: NEGATIVE mg/dL
Nitrite: POSITIVE — AB
Urobilinogen, UA: 0.2 mg/dL (ref 0.0–1.0)

## 2012-05-16 MED ORDER — ONDANSETRON HCL 4 MG PO TABS: 4.0000 mg | ORAL_TABLET | Freq: Four times a day (QID) | ORAL | Status: DC

## 2012-05-16 MED ORDER — LACTINEX PO CHEW: 1.0000 | CHEWABLE_TABLET | Freq: Three times a day (TID) | ORAL | Status: DC

## 2012-05-16 MED ORDER — CEPHALEXIN 500 MG PO CAPS: 500.0000 mg | ORAL_CAPSULE | Freq: Two times a day (BID) | ORAL | Status: DC

## 2012-05-16 NOTE — ED Provider Notes (Signed)
9:07 AM  Date: 05/16/2012  Rate: 64  Rhythm: normal sinus rhythm  QRS Axis: left  Intervals: normal QRS:  Poor R wave progression in precordial leads suggests possilble old anterior myocardial infarction.  ST/T Wave abnormalities: normal  Conduction Disutrbances:left anterior fascicular block  Narrative Interpretation: Abnormal EKG  Old EKG Reviewed: unchanged    Carleene Cooper III, MD 05/16/12 [phone]

## 2012-05-16 NOTE — ED Notes (Signed)
Patient transported to X-ray 

## 2012-05-16 NOTE — ED Provider Notes (Signed)
Medical screening examination/treatment/procedure(s) were performed by non-physician practitioner and as supervising physician I was immediately available for consultation/collaboration.   Carleene Cooper III, MD 05/16/12 [phone]

## 2012-05-16 NOTE — ED Provider Notes (Signed)
History     CSN: [id number]  Arrival date & time 05/16/12  0806   None     Chief Complaint  Patient presents with  . Emesis  . Diarrhea    (Consider location/radiation/quality/duration/timing/severity/associated sxs/prior treatment) HPI 52 year old female with past medical history significant for diabetes and hypertension presents emergency department chief complaint of nausea vomiting and diarrhea for one week.  Patient states she was exposed to family members with same molars symptoms one week ago.  States that family members symptoms resolved within 24-48 hours.  Patient states that she had sudden onset diffuse colicky abdominal pain the night of Tuesday the 11th 8 days ago.  She then developed nausea vomiting followed by watery diarrhea.  Patient has had intermittent bouts of diarrhea, nausea and vomiting since that time.  She states that she may go several hours without B. symptoms has been able to hold down fluids but has had decreased appetite.  Patient denies any hematemesis, hematochezia or melena.  She denies any bilious vomit.  She has had a previous cholecystectomy.  Patient has not been taking her furosemide as she has not had any leg swelling and has felt dehydrated and weak.  Has any chest pain or shortness of breath.  Denies fevers or myalgias. Patient has a history of abdominal surgeries. Past Medical History  Diagnosis Date  . Diabetes mellitus   . Hypertension     Past Surgical History  Procedure Laterality Date  . Colon surgery    . Abdominal hysterectomy    . Tonsillectomy    . Knee arthroscopy      No family history on file.  History  Substance Use Topics  . Smoking status: Never Smoker   . Smokeless tobacco: Not on file  . Alcohol Use: No    OB History   Grav Para Term Preterm Abortions TAB SAB Ect Mult Living                  Review of Systems Ten systems reviewed and are negative for acute change, except as noted in the HPI.   Allergies   Codeine; Other; Sulfa drugs cross reactors; and Benicar  Home Medications   Current Outpatient Rx  Name  Route  Sig  Dispense  Refill  . aspirin 325 MG tablet   Oral   Take 325 mg by mouth daily.         Marland Kitchen atenolol (TENORMIN) 25 MG tablet   Oral   Take 25 mg by mouth 2 (two) times daily.           . furosemide (LASIX) 40 MG tablet   Oral   Take 80 mg by mouth 2 (two) times daily.          Marland Kitchen ibuprofen (ADVIL,MOTRIN) 200 MG tablet   Oral   Take 800 mg by mouth every 6 (six) hours as needed. For pain         . meloxicam (MOBIC) 15 MG tablet   Oral   Take 15 mg by mouth daily.         . metFORMIN (GLUCOPHAGE) 1000 MG tablet   Oral   Take 1,000 mg by mouth 2 (two) times daily with a meal.           . potassium chloride SA (K-DUR,KLOR-CON) 20 MEQ tablet   Oral   Take 40 mEq by mouth 2 (two) times daily.             BP 132/79  Pulse 70  Temp(Src) 97.6 F (36.4 C) (Oral)  SpO2 100%  Physical Exam  Physical Exam  Nursing note and vitals reviewed. Constitutional: She is oriented to person, place, and time. She appears well-developed and well-nourished. No distress.  HENT:  Head: Normocephalic and atraumatic.  Eyes: Conjunctivae normal and EOM are normal. Pupils are equal, round, and reactive to light. No scleral icterus.  Neck: Normal range of motion.  Cardiovascular: Normal rate, regular rhythm and normal heart sounds.  Exam reveals no gallop and no friction rub.   No murmur heard. Pulmonary/Chest: Effort normal and breath sounds normal. No respiratory distress.  Abdominal: Soft. Bowel sounds are normal. She exhibits no distension and no mass. There is no tenderness. There is no guarding.  Neurological: She is alert and oriented to person, place, and time.  Skin: Skin is warm and dry. She is not diaphoretic.    ED Course  Procedures (including critical care time)  Labs Reviewed - No data to display No results found.   1. UTI (lower urinary  tract infection)   2. N&V (nausea and vomiting)   3. Diarrhea       MDM  8:39 AM BP 132/79  Pulse 70  Temp(Src) 97.6 F (36.4 C) (Oral)  SpO2 100% Patient appears well.  Symptoms are consistent with possible viral gastroenteritis however I must consider atypical chest pain as inpatient as patient is  obese with factors including hypertension and diabetes.  Patient may also have developed small bowel obstruction as she has had previous surgeries to the bowel.  Physical exam is non-concerning for surgical abdomen.  He has no focal abdominal pain.  We'll evaluate urine for infection.      10:29 AM Filed Vitals:   05/16/12 0814  BP: 132/79  Pulse: 70  Temp: 97.6 F (36.4 C)  patient labs WNL. Awaiting UA>, neg troponin.  Patient is w/out n/v/d since visit. No abdoinal pain. I do not suspect cardiac etiology.  11:40 AM I Pt has been diagnosed with a UTI. Pt is afebrile, no CVA tenderness, normotensive, and denies N/V. Pt to be dc home with antibiotics and instructions to follow up with PCP if symptoms persist/. Will D/c with zofran, lactinex , and keflex.  Patient urine sent for culture. F/u w/ PCP if diarrhea does not resolve. Discussed reasons to seek immediate care. Patient expresses understanding and agrees with plan.    Arthor Captain, PA-C 05/16/12 1612

## 2012-05-16 NOTE — ED Notes (Signed)
Pt reporting v/d since last Tuesday. Last vomited yesterday, no diarrhea. States has not taken regular meds this week. A x 4. NAD

## 2012-05-18 LAB — URINE CULTURE: Colony Count: >=100000

## 2012-08-10 ENCOUNTER — Emergency Department (HOSPITAL_COMMUNITY): Payer: BC Managed Care – PPO

## 2012-08-10 ENCOUNTER — Emergency Department (HOSPITAL_COMMUNITY)
Admission: EM | Admit: 2012-08-10 | Discharge: 2012-08-10 | Disposition: A | Discharge status: Home / Self Care | Payer: BC Managed Care – PPO | Attending: Emergency Medicine | Admitting: Emergency Medicine

## 2012-08-10 ENCOUNTER — Encounter (HOSPITAL_COMMUNITY): Payer: Self-pay | Admitting: Emergency Medicine

## 2012-08-10 DIAGNOSIS — I10 Essential (primary) hypertension: Secondary | ICD-10-CM | POA: Insufficient documentation

## 2012-08-10 DIAGNOSIS — Z79899 Other long term (current) drug therapy: Secondary | ICD-10-CM | POA: Insufficient documentation

## 2012-08-10 DIAGNOSIS — Z7982 Long term (current) use of aspirin: Secondary | ICD-10-CM | POA: Insufficient documentation

## 2012-08-10 DIAGNOSIS — Z90710 Acquired absence of both cervix and uterus: Secondary | ICD-10-CM | POA: Insufficient documentation

## 2012-08-10 DIAGNOSIS — E119 Type 2 diabetes mellitus without complications: Secondary | ICD-10-CM | POA: Insufficient documentation

## 2012-08-10 DIAGNOSIS — R112 Nausea with vomiting, unspecified: Secondary | ICD-10-CM | POA: Insufficient documentation

## 2012-08-10 DIAGNOSIS — R109 Unspecified abdominal pain: Secondary | ICD-10-CM | POA: Insufficient documentation

## 2012-08-10 LAB — POCT I-STAT, CHEM 8
Creatinine, Ser: 0.8 mg/dL (ref 0.50–1.10)
Hemoglobin: 13.6 g/dL (ref 12.0–15.0)
Potassium: 3.9 mEq/L (ref 3.5–5.1)
Sodium: 143 mEq/L (ref 135–145)

## 2012-08-10 LAB — CBC
MCH: 29.3 pg (ref 26.0–34.0)
MCHC: 33 g/dL (ref 30.0–36.0)
MCV: 88.9 fL (ref 78.0–100.0)
Platelets: 270 10*3/uL (ref 150–400)
RDW: 13.5 % (ref 11.5–15.5)

## 2012-08-10 LAB — COMPREHENSIVE METABOLIC PANEL
AST: 15 U/L (ref 0–37)
Albumin: 3.7 g/dL (ref 3.5–5.2)
CO2: 27 mEq/L (ref 19–32)
Calcium: 8.9 mg/dL (ref 8.4–10.5)
Creatinine, Ser: 0.81 mg/dL (ref 0.50–1.10)
GFR calc non Af Amer: 83 mL/min — ABNORMAL LOW (ref >90)

## 2012-08-10 MED ORDER — HYDROMORPHONE HCL PF 1 MG/ML IJ SOLN
1.0000 mg | Freq: Once | INTRAMUSCULAR | Status: AC
  Administered 2012-08-10: 1 mg via INTRAVENOUS
  Filled 2012-08-10: qty 1

## 2012-08-10 MED ORDER — ONDANSETRON HCL 4 MG/2ML IJ SOLN
4.0000 mg | Freq: Once | INTRAMUSCULAR | Status: AC
  Administered 2012-08-10: 4 mg via INTRAVENOUS
  Filled 2012-08-10: qty 2

## 2012-08-10 MED ORDER — PANTOPRAZOLE SODIUM 40 MG IV SOLR
40.0000 mg | Freq: Once | INTRAVENOUS | Status: AC
  Administered 2012-08-10: 40 mg via INTRAVENOUS
  Filled 2012-08-10: qty 40

## 2012-08-10 MED ORDER — SODIUM CHLORIDE 0.9 % IV BOLUS (SEPSIS)
1000.0000 mL | Freq: Once | INTRAVENOUS | Status: AC
  Administered 2012-08-10: 1000 mL via INTRAVENOUS

## 2012-08-10 MED ORDER — ONDANSETRON HCL 4 MG PO TABS: 4.0000 mg | ORAL_TABLET | Freq: Four times a day (QID) | ORAL | Status: DC

## 2012-08-10 NOTE — ED Notes (Signed)
Pt. States that around 2100 last night she started vomited and it has been continuous since that time. She had been having abdominal pain all day. Ate chinese food for supper. Denies diarrhea. States she has been having chills but no fever.

## 2012-08-10 NOTE — ED Provider Notes (Signed)
History     CSN: [id number]  Arrival date & time 08/10/12  0508   First MD Initiated Contact with Patient 08/10/12 [phone]      Chief Complaint  Patient presents with  . Emesis  . Nausea  . Abdominal Pain    (Consider location/radiation/quality/duration/timing/severity/associated sxs/prior treatment) HPI HX per PT: N/V and ABD pain since yesterday. Multiple bouts of emesis, no blood or coffee grounds. No diarrhea, last BM today normal without blood. Pain located upper and mid ABD, cramping, comes and goes. No F/C. No trauma, no h/o same, no known sick contacts Past Medical History  Diagnosis Date  . Diabetes mellitus   . Hypertension     Past Surgical History  Procedure Laterality Date  . Colon surgery    . Abdominal hysterectomy    . Tonsillectomy    . Knee arthroscopy      No family history on file.  History  Substance Use Topics  . Smoking status: Never Smoker   . Smokeless tobacco: Not on file  . Alcohol Use: No    OB History   Grav Para Term Preterm Abortions TAB SAB Ect Mult Living                  Review of Systems  Constitutional: Negative for fever and chills.  HENT: Negative for neck pain and neck stiffness.   Eyes: Negative for pain.  Respiratory: Negative for shortness of breath.   Cardiovascular: Negative for chest pain.  Gastrointestinal: Positive for nausea, vomiting and abdominal pain.  Genitourinary: Negative for dysuria.  Musculoskeletal: Negative for back pain.  Skin: Negative for rash.  Neurological: Negative for headaches.  All other systems reviewed and are negative.    Allergies  Lorabid; Codeine; Other; Sulfa drugs cross reactors; and Benicar  Home Medications   Current Outpatient Rx  Name  Route  Sig  Dispense  Refill  . aspirin 325 MG tablet   Oral   Take 325 mg by mouth daily.         Marland Kitchen atenolol (TENORMIN) 25 MG tablet   Oral   Take 25 mg by mouth 2 (two) times daily.           . cephALEXin (KEFLEX) 500 MG  capsule   Oral   Take 1 capsule (500 mg total) by mouth 2 (two) times daily.   20 capsule   0   . furosemide (LASIX) 40 MG tablet   Oral   Take 80 mg by mouth 2 (two) times daily.          Marland Kitchen ibuprofen (ADVIL,MOTRIN) 200 MG tablet   Oral   Take 800 mg by mouth every 6 (six) hours as needed. For pain         . lactobacillus acidophilus & bulgar (LACTINEX) chewable tablet   Oral   Chew 1 tablet by mouth 3 (three) times daily with meals.   30 tablet   0   . meloxicam (MOBIC) 15 MG tablet   Oral   Take 15 mg by mouth daily.         . metFORMIN (GLUCOPHAGE) 1000 MG tablet   Oral   Take 1,000 mg by mouth 2 (two) times daily with a meal.           . Omega-3 Fatty Acids (FISH OIL PO)   Oral   Take 1 capsule by mouth daily.         . ondansetron (ZOFRAN) 4 MG tablet   Oral  Take 1 tablet (4 mg total) by mouth every 6 (six) hours.   12 tablet   0   . potassium chloride SA (K-DUR,KLOR-CON) 20 MEQ tablet   Oral   Take 40 mEq by mouth 2 (two) times daily.             BP 141/84  Pulse 93  Temp(Src) 98.3 F (36.8 C) (Oral)  Resp 20  SpO2 97%  Physical Exam  Constitutional: She is oriented to person, place, and time. She appears well-developed and well-nourished.  HENT:  Head: Normocephalic and atraumatic.  Mouth/Throat: Oropharynx is clear and moist.  Eyes: EOM are normal. Pupils are equal, round, and reactive to light. No scleral icterus.  Neck: Neck supple.  Cardiovascular: Regular rhythm and intact distal pulses.   Pulmonary/Chest: Effort normal. No respiratory distress.  Abdominal: Soft. Bowel sounds are normal. She exhibits no distension and no mass.  mild diffuse tenderness, no point tenderness, no acute ABD  Musculoskeletal: Normal range of motion. She exhibits no edema.  Neurological: She is alert and oriented to person, place, and time. No cranial nerve deficit.  Skin: Skin is warm and dry.    ED Course  Procedures (including critical care  time)  Results for orders placed during the hospital encounter of 08/10/12  CBC      Result Value Range   WBC 10.6 (*) 4.0 - 10.5 K/uL   RBC 4.50  3.87 - 5.11 MIL/uL   Hemoglobin 13.2  12.0 - 15.0 g/dL   HCT 47.8  29.5 - 62.1 %   MCV 88.9  78.0 - 100.0 fL   MCH 29.3  26.0 - 34.0 pg   MCHC 33.0  30.0 - 36.0 g/dL   RDW 30.8  65.7 - 84.6 %   Platelets 270  150 - 400 K/uL  COMPREHENSIVE METABOLIC PANEL      Result Value Range   Sodium 142  135 - 145 mEq/L   Potassium 4.1  3.5 - 5.1 mEq/L   Chloride 103  96 - 112 mEq/L   CO2 27  19 - 32 mEq/L   Glucose, Bld 189 (*) 70 - 99 mg/dL   BUN 17  6 - 23 mg/dL   Creatinine, Ser 9.62  0.50 - 1.10 mg/dL   Calcium 8.9  8.4 - 95.2 mg/dL   Total Protein 7.2  6.0 - 8.3 g/dL   Albumin 3.7  3.5 - 5.2 g/dL   AST 15  0 - 37 U/L   ALT 22  0 - 35 U/L   Alkaline Phosphatase 83  39 - 117 U/L   Total Bilirubin 0.5  0.3 - 1.2 mg/dL   GFR calc non Af Amer 83 (*) >90 mL/min   GFR calc Af Amer >90  >90 mL/min  LIPASE, BLOOD      Result Value Range   Lipase 23  11 - 59 U/L  POCT I-STAT, CHEM 8      Result Value Range   Sodium 143  135 - 145 mEq/L   Potassium 3.9  3.5 - 5.1 mEq/L   Chloride 104  96 - 112 mEq/L   BUN 17  6 - 23 mg/dL   Creatinine, Ser 8.41  0.50 - 1.10 mg/dL   Glucose, Bld 324 (*) 70 - 99 mg/dL   Calcium, Ion 4.01 (*) 1.12 - 1.23 mmol/L   TCO2 27  0 - 100 mmol/L   Hemoglobin 13.6  12.0 - 15.0 g/dL   HCT 02.7  25.3 -  46.0 %   Dg Abd Acute W/chest  08/10/2012   *RADIOLOGY REPORT*  Clinical Data: Abdominal pain, emesis  ACUTE ABDOMEN SERIES (ABDOMEN 2 VIEW & CHEST 1 VIEW)  Comparison: 05/17/2012  Findings: Lungs clear.  Cardiomediastinal contours within normal range.  No free intraperitoneal air. The bowel gas pattern is non- obstructive. Organ outlines are normal where seen. No acute or aggressive osseous abnormality identified.  Surgical clips right upper quadrant.  IMPRESSION: Nonobstructive bowel gas pattern.  Status post  cholecystectomy.  No radiographic evidence of acute cardiopulmonary process.   Original Report Authenticated By: Jearld Lesch, M.D.     Date: 08/10/2012  Rate: 78  Rhythm: normal sinus rhythm  QRS Axis: left  Intervals: normal  ST/T Wave abnormalities: nonspecific ST changes  Conduction Disutrbances:none  Narrative Interpretation:   Old EKG Reviewed: unchanged  IVFs, IV Dilaudid, IV zofran  7:02 AM feeling much better, ABD exam benign, PO fluid challenge. Plan d/c home Rx and close PCP follow up. Strict return precautions verbalized as understood.   MDM  N/V ABD cramping  Evaluated with labs and imaging as above  Improved with IVFs and medications  VS and nurisng notes reviewed        Sunnie Nielsen, MD 08/11/12 [phone]

## 2012-12-06 ENCOUNTER — Encounter (HOSPITAL_COMMUNITY): Payer: Self-pay | Admitting: Emergency Medicine

## 2012-12-06 ENCOUNTER — Emergency Department (HOSPITAL_COMMUNITY): Payer: BC Managed Care – PPO

## 2012-12-06 ENCOUNTER — Emergency Department (HOSPITAL_COMMUNITY)
Admission: EM | Admit: 2012-12-06 | Discharge: 2012-12-06 | Disposition: A | Discharge status: Home / Self Care | Payer: BC Managed Care – PPO | Attending: Emergency Medicine | Admitting: Emergency Medicine

## 2012-12-06 DIAGNOSIS — M791 Myalgia, unspecified site: Secondary | ICD-10-CM

## 2012-12-06 DIAGNOSIS — R0602 Shortness of breath: Secondary | ICD-10-CM | POA: Insufficient documentation

## 2012-12-06 DIAGNOSIS — Z79899 Other long term (current) drug therapy: Secondary | ICD-10-CM | POA: Insufficient documentation

## 2012-12-06 DIAGNOSIS — J40 Bronchitis, not specified as acute or chronic: Secondary | ICD-10-CM

## 2012-12-06 DIAGNOSIS — Z7982 Long term (current) use of aspirin: Secondary | ICD-10-CM | POA: Insufficient documentation

## 2012-12-06 DIAGNOSIS — R6883 Chills (without fever): Secondary | ICD-10-CM | POA: Insufficient documentation

## 2012-12-06 DIAGNOSIS — IMO0001 Reserved for inherently not codable concepts without codable children: Secondary | ICD-10-CM | POA: Insufficient documentation

## 2012-12-06 DIAGNOSIS — R011 Cardiac murmur, unspecified: Secondary | ICD-10-CM | POA: Insufficient documentation

## 2012-12-06 DIAGNOSIS — E119 Type 2 diabetes mellitus without complications: Secondary | ICD-10-CM | POA: Insufficient documentation

## 2012-12-06 DIAGNOSIS — Z791 Long term (current) use of non-steroidal anti-inflammatories (NSAID): Secondary | ICD-10-CM | POA: Insufficient documentation

## 2012-12-06 DIAGNOSIS — I10 Essential (primary) hypertension: Secondary | ICD-10-CM | POA: Insufficient documentation

## 2012-12-06 LAB — CBC
MCH: 29.8 pg (ref 26.0–34.0)
MCHC: 33.2 g/dL (ref 30.0–36.0)
MCV: 89.7 fL (ref 78.0–100.0)
Platelets: 283 10*3/uL (ref 150–400)
RDW: 13.7 % (ref 11.5–15.5)

## 2012-12-06 LAB — BASIC METABOLIC PANEL
Calcium: 9.5 mg/dL (ref 8.4–10.5)
Creatinine, Ser: 0.93 mg/dL (ref 0.50–1.10)
GFR calc Af Amer: 82 mL/min — ABNORMAL LOW (ref >90)

## 2012-12-06 LAB — PRO B NATRIURETIC PEPTIDE: Pro B Natriuretic peptide (BNP): 288.5 pg/mL — ABNORMAL HIGH (ref 0–125)

## 2012-12-06 MED ORDER — ALBUTEROL SULFATE HFA 108 (90 BASE) MCG/ACT IN AERS
6.0000 | INHALATION_SPRAY | Freq: Once | RESPIRATORY_TRACT | Status: AC
  Administered 2012-12-06: 6 via RESPIRATORY_TRACT
  Filled 2012-12-06: qty 6.7

## 2012-12-06 NOTE — ED Provider Notes (Signed)
CSN: [id number]     Arrival date & time 12/06/12  1500 History   First MD Initiated Contact with Patient 12/06/12 1852     Chief Complaint  Patient presents with  . Chest Pain   (Consider location/radiation/quality/duration/timing/severity/associated sxs/prior Treatment) Patient is a 52 y.o. female presenting with chest pain. The history is provided by the patient. No language interpreter was used.  Chest Pain Pain location:  Substernal area Pain quality: tightness   Pain radiates to:  Does not radiate Pain radiates to the back: no   Pain severity:  Mild Onset quality:  Gradual Duration:  13 hours Timing:  Constant Progression:  Unchanged Chronicity:  New Context: at rest   Context: no stress and no trauma   Relieved by:  Nothing Worsened by:  Nothing tried Ineffective treatments:  None tried Associated symptoms: shortness of breath   Associated symptoms: no abdominal pain, no cough, no fever, no headache, no nausea and not vomiting   Risk factors: diabetes mellitus and hypertension   Risk factors: no birth control, not pregnant, no prior DVT/PE and no smoking     Past Medical History  Diagnosis Date  . Diabetes mellitus   . Hypertension    Past Surgical History  Procedure Laterality Date  . Colon surgery    . Abdominal hysterectomy    . Tonsillectomy    . Knee arthroscopy     No family history on file. History  Substance Use Topics  . Smoking status: Never Smoker   . Smokeless tobacco: Not on file  . Alcohol Use: No   OB History   Grav Para Term Preterm Abortions TAB SAB Ect Mult Living                 Review of Systems  Constitutional: Negative for fever.  HENT: Negative for congestion, sore throat and rhinorrhea.   Respiratory: Positive for chest tightness and shortness of breath. Negative for cough.   Cardiovascular: Negative for chest pain.  Gastrointestinal: Negative for nausea, vomiting, abdominal pain and diarrhea.  Genitourinary: Negative for  dysuria and hematuria.  Skin: Negative for rash.  Neurological: Negative for syncope, light-headedness and headaches.  All other systems reviewed and are negative.    Allergies  Lorabid; Codeine; Other; Sulfa drugs cross reactors; and Benicar  Home Medications   Current Outpatient Rx  Name  Route  Sig  Dispense  Refill  . aspirin 325 MG tablet   Oral   Take 325 mg by mouth daily.         Marland Kitchen atenolol (TENORMIN) 25 MG tablet   Oral   Take 25 mg by mouth 2 (two) times daily.           . furosemide (LASIX) 40 MG tablet   Oral   Take 80 mg by mouth 2 (two) times daily.          Marland Kitchen ibuprofen (ADVIL,MOTRIN) 200 MG tablet   Oral   Take 800 mg by mouth every 6 (six) hours as needed. For pain         . lactobacillus acidophilus & bulgar (LACTINEX) chewable tablet   Oral   Chew 1 tablet by mouth 3 (three) times daily with meals.   30 tablet   0   . meloxicam (MOBIC) 15 MG tablet   Oral   Take 15 mg by mouth daily.         . metFORMIN (GLUCOPHAGE) 1000 MG tablet   Oral   Take 1,000 mg by  mouth daily with breakfast.          . Omega-3 Fatty Acids (FISH OIL PO)   Oral   Take 1 capsule by mouth daily.         . ondansetron (ZOFRAN) 4 MG tablet   Oral   Take 1 tablet (4 mg total) by mouth every 6 (six) hours.   12 tablet   0   . potassium chloride SA (K-DUR,KLOR-CON) 20 MEQ tablet   Oral   Take 40 mEq by mouth 2 (two) times daily.            BP 142/71  Pulse 64  Temp(Src) 98.1 F (36.7 C) (Oral)  Resp 18  Ht 5\' 5"  (1.651 m)  Wt 254 lb 12.8 oz (115.577 kg)  BMI 42.4 kg/m2  SpO2 98% Physical Exam  Nursing note and vitals reviewed. Constitutional: She is oriented to person, place, and time. She appears well-developed and well-nourished.  HENT:  Head: Normocephalic and atraumatic.  Right Ear: External ear normal.  Left Ear: External ear normal.  Eyes: EOM are normal. Pupils are equal, round, and reactive to light.  Neck: Normal range of motion.  Neck supple.  Cardiovascular: Normal rate, regular rhythm and intact distal pulses.  Exam reveals no gallop and no friction rub.   Murmur (2/6 SEM) heard. Pulmonary/Chest: Effort normal. No respiratory distress. She has no wheezes. She has no rales. She exhibits no tenderness.  Globally diminished  Abdominal: Soft. Bowel sounds are normal. She exhibits no distension. There is no tenderness. There is no rebound.  Musculoskeletal: Normal range of motion. She exhibits no edema and no tenderness.  Lymphadenopathy:    She has no cervical adenopathy.  Neurological: She is alert and oriented to person, place, and time.  Skin: Skin is warm. No rash noted.  Psychiatric: She has a normal mood and affect. Her behavior is normal.    ED Course  Procedures (including critical care time) Labs Review Labs Reviewed  BASIC METABOLIC PANEL - Abnormal; Notable for the following:    Glucose, Bld 165 (*)    BUN 24 (*)    GFR calc non Af Amer 70 (*)    GFR calc Af Amer 82 (*)    All other components within normal limits  PRO B NATRIURETIC PEPTIDE - Abnormal; Notable for the following:    Pro B Natriuretic peptide (BNP) 288.5 (*)    All other components within normal limits  CBC  POCT I-STAT TROPONIN I   Imaging Review Dg Chest 2 View  12/06/2012   *RADIOLOGY REPORT*  Clinical Data: Chest tightness, chills, wheezing  CHEST - 2 VIEW  Comparison: Chest x-ray of 08/10/2012  Findings: The lungs are not well aerated.  However, no infiltrate or effusion is seen.  Heart is borderline enlarged and stable.  No bony abnormality is seen.  IMPRESSION: Stable chest x-ray.  No active lung disease.   Original Report Authenticated By: Dwyane Dee, M.D.    MDM  No diagnosis found. 6:52 PM Pt is a 52 y.o. female with pertinent PMHX of DM, HTN who presents to the ED with chest tightness, myalgias, fatigue, shortness of breath. Chest tightness this morning since 5AM. Described as tightness substernalwithout radiation to  left arm/left jaw. Associated shortness of breath,no diaphoresis, no nausea and vomiting. Denies pleuritic chest pain. Denies fevers, recent illness, cough, congestion, URI. Endorses chills off and on.   On Exam: AFVSS. Lungs diminished globally. Plan to obtain iStat troponin, BNP, CBC, BMP. Has  risk factors for ACS, however story more consistent with possible viral illness/bronchitis. Plan for 6 puffs albuterol and re-evaluation.   EKG personally reviewed by myself showed NSR, LAFB Rate of 63, PR , QRS 88ms QT/QTC 384/363ms, left axis, without evidence of new ischemia. Comparison showed improvement t-wave flattening in lateral leads, indication: chest pain  CXR PA/LAT per my read for chest pain showed no pneumonia, no ptx  Review of labs: iStat troponin negative. Given constant chest tightness since 5AM and negative istat troponin low clinical suspicion for acute MI. CXr showed no pneumonia, likely bronchitis. Pt stated she had similar symptoms previously and relieved with albuterol. CBc showed no anemia. No leukocytosis. BMP showed no electrolyte abnormality. BNP 288.5, however no history of CHF, no documented echo and no evidence of acute overload.  On re-evaluation at 8PM pt feeling much better after albuterol and was moving air better. Will continue to monitor. Pt feels ready to go home. Plan on discharge with close follow up with PCP. Pt to continue albuterol 2 puffs every 2 hours as needed.  9:01 PM: I have discussed the diagnosis/risks/treatment options with the patient and family and believe the pt to be eligible for discharge home to follow-up with PCP in 1 week. We also discussed returning to the ED immediately if new or worsening sx occur. We discussed the sx which are most concerning (e.g., worsening symptoms: chest pain, shortness of breath) that necessitate immediate return. Any new prescriptions provided to the patient are listed below.   Discharge Medication List as of 12/06/2012   9:03 PM      The patient appears reasonably screened and/or stabilized for discharge and I doubt any other medical condition or other [facility] requiring further screening, evaluation or treatment in the ED at this time prior to discharge . Pt in agreement with discharge plan. Return precautions given. Pt discharged VSS   Labs, EKG and imaging reviewed by myself and considered in medical decision making if ordered.  Imaging interpreted by radiology. Pt was discussed with my attending, Dr. Doristine Mango, MD 12/07/12 [phone]

## 2012-12-06 NOTE — ED Notes (Signed)
Pt presents with intermittent chest tightness and shortness of breath- states that she has been having body aches for the past 2 days.  NSR on monitor.

## 2012-12-06 NOTE — ED Notes (Signed)
C/o chills x 3 days, nausea yesterday, and intermittent chest tightness with sob since this morning.

## 2012-12-07 NOTE — ED Provider Notes (Signed)
I saw and evaluated the patient, reviewed the resident's note and I agree with the findings and plan.   .Face to face Exam:  General:  Awake HEENT:  Atraumatic Resp:  Normal effort Abd:  Nondistended Neuro:No focal weakness   Nelia Shi, MD 12/07/12 2211

## 2012-12-25 ENCOUNTER — Encounter (HOSPITAL_COMMUNITY): Payer: Self-pay | Admitting: Emergency Medicine

## 2012-12-25 ENCOUNTER — Emergency Department (HOSPITAL_COMMUNITY)
Admission: EM | Admit: 2012-12-25 | Discharge: 2012-12-25 | Disposition: A | Discharge status: Home / Self Care | Payer: BC Managed Care – PPO | Attending: Emergency Medicine | Admitting: Emergency Medicine

## 2012-12-25 DIAGNOSIS — Z791 Long term (current) use of non-steroidal anti-inflammatories (NSAID): Secondary | ICD-10-CM | POA: Insufficient documentation

## 2012-12-25 DIAGNOSIS — Z79899 Other long term (current) drug therapy: Secondary | ICD-10-CM | POA: Insufficient documentation

## 2012-12-25 DIAGNOSIS — Z7982 Long term (current) use of aspirin: Secondary | ICD-10-CM | POA: Insufficient documentation

## 2012-12-25 DIAGNOSIS — M549 Dorsalgia, unspecified: Secondary | ICD-10-CM | POA: Insufficient documentation

## 2012-12-25 DIAGNOSIS — N390 Urinary tract infection, site not specified: Secondary | ICD-10-CM | POA: Insufficient documentation

## 2012-12-25 DIAGNOSIS — E669 Obesity, unspecified: Secondary | ICD-10-CM | POA: Insufficient documentation

## 2012-12-25 DIAGNOSIS — E119 Type 2 diabetes mellitus without complications: Secondary | ICD-10-CM | POA: Insufficient documentation

## 2012-12-25 DIAGNOSIS — I498 Other specified cardiac arrhythmias: Secondary | ICD-10-CM | POA: Insufficient documentation

## 2012-12-25 DIAGNOSIS — I10 Essential (primary) hypertension: Secondary | ICD-10-CM | POA: Insufficient documentation

## 2012-12-25 LAB — URINALYSIS, ROUTINE W REFLEX MICROSCOPIC
Glucose, UA: NEGATIVE mg/dL
Hgb urine dipstick: NEGATIVE
Protein, ur: NEGATIVE mg/dL

## 2012-12-25 LAB — POCT I-STAT, CHEM 8
Calcium, Ion: 1.16 mmol/L (ref 1.12–1.23)
Creatinine, Ser: 0.8 mg/dL (ref 0.50–1.10)
Glucose, Bld: 157 mg/dL — ABNORMAL HIGH (ref 70–99)
Hemoglobin: 14.3 g/dL (ref 12.0–15.0)
Sodium: 141 mEq/L (ref 135–145)
TCO2: 27 mmol/L (ref 0–100)

## 2012-12-25 LAB — URINE MICROSCOPIC-ADD ON

## 2012-12-25 MED ORDER — HYDROCODONE-ACETAMINOPHEN 5-325 MG PO TABS: 1.0000 | ORAL_TABLET | ORAL | Status: DC | PRN

## 2012-12-25 MED ORDER — NITROFURANTOIN MONOHYD MACRO 100 MG PO CAPS
100.0000 mg | ORAL_CAPSULE | Freq: Once | ORAL | Status: AC
  Administered 2012-12-25: 100 mg via ORAL
  Filled 2012-12-25: qty 1

## 2012-12-25 MED ORDER — NITROFURANTOIN MONOHYD MACRO 100 MG PO CAPS: 100.0000 mg | ORAL_CAPSULE | Freq: Two times a day (BID) | ORAL | Status: DC

## 2012-12-25 MED ORDER — HYDROCODONE-ACETAMINOPHEN 5-325 MG PO TABS
2.0000 | ORAL_TABLET | Freq: Once | ORAL | Status: AC
  Administered 2012-12-25: 2 via ORAL
  Filled 2012-12-25: qty 2

## 2012-12-25 NOTE — ED Provider Notes (Signed)
CSN: [id number]     Arrival date & time 12/25/12  2952 History   First MD Initiated Contact with Patient 12/25/12 [phone]     Chief Complaint  Patient presents with  . Back Pain  . Groin Pain   (Consider location/radiation/quality/duration/timing/severity/associated sxs/prior Treatment) Patient is a 52 y.o. female presenting with back pain and groin pain.  Back Pain Groin Pain   Complaint of low back pain bilateral, radiating to the suprapubic area. Back pain onset last night. Suprapubic onset this morning. Pain is worse with standing. Sharp in quality Improved with sitting. She treated herself with ibuprofen, partial relief. No fever  No anorexia. Last bowel movement yesterday, normal. No nausea or vomiting No other associated symptoms.  Past Medical History  Diagnosis Date  . Diabetes mellitus   . Hypertension    Past Surgical History  Procedure Laterality Date  . Colon surgery    . Abdominal hysterectomy    . Tonsillectomy    . Knee arthroscopy     History reviewed. No pertinent family history. History  Substance Use Topics  . Smoking status: Never Smoker   . Smokeless tobacco: Not on file  . Alcohol Use: No   OB History   Grav Para Term Preterm Abortions TAB SAB Ect Mult Living                 Review of Systems  Constitutional: Negative.   HENT: Negative.   Respiratory: Negative.   Cardiovascular: Negative.   Gastrointestinal: Negative.   Genitourinary:       Groin pain  Musculoskeletal: Positive for back pain.  Skin: Negative.   Neurological: Negative.   Psychiatric/Behavioral: Negative.   All other systems reviewed and are negative.    Allergies  Lorabid; Codeine; Other; Sulfa drugs cross reactors; and Benicar  Home Medications   Current Outpatient Rx  Name  Route  Sig  Dispense  Refill  . aspirin 325 MG tablet   Oral   Take 325 mg by mouth daily.         Marland Kitchen atenolol (TENORMIN) 25 MG tablet   Oral   Take 25 mg by mouth 2 (two) times daily.            . furosemide (LASIX) 40 MG tablet   Oral   Take 80 mg by mouth 2 (two) times daily.          Marland Kitchen ibuprofen (ADVIL,MOTRIN) 200 MG tablet   Oral   Take 800 mg by mouth every 6 (six) hours as needed. For pain         . lactobacillus acidophilus & bulgar (LACTINEX) chewable tablet   Oral   Chew 1 tablet by mouth 3 (three) times daily with meals.   30 tablet   0   . meloxicam (MOBIC) 15 MG tablet   Oral   Take 15 mg by mouth daily.         . metFORMIN (GLUCOPHAGE) 1000 MG tablet   Oral   Take 1,000 mg by mouth daily with breakfast.          . Omega-3 Fatty Acids (FISH OIL PO)   Oral   Take 1 capsule by mouth daily.         . ondansetron (ZOFRAN) 4 MG tablet   Oral   Take 1 tablet (4 mg total) by mouth every 6 (six) hours.   12 tablet   0   . potassium chloride SA (K-DUR,KLOR-CON) 20 MEQ tablet   Oral  Take 40 mEq by mouth 2 (two) times daily.            BP 157/81  Pulse 57  Temp(Src) 97.9 F (36.6 C) (Oral)  Resp 18  Ht 5\' 6"  (1.676 m)  Wt 250 lb (113.399 kg)  BMI 40.37 kg/m2  SpO2 100% Physical Exam  Nursing note and vitals reviewed. Constitutional: She is oriented to person, place, and time. She appears well-developed and well-nourished.  HENT:  Head: Normocephalic and atraumatic.  Eyes: Conjunctivae are normal. Pupils are equal, round, and reactive to light.  Neck: Neck supple. No tracheal deviation present. No thyromegaly present.  Cardiovascular: Regular rhythm.   No murmur heard. Slightly bradycardic  Pulmonary/Chest: Effort normal and breath sounds normal.  Abdominal: Soft. Bowel sounds are normal. She exhibits no distension. There is no tenderness.  Obese  Musculoskeletal: Normal range of motion. She exhibits no edema and no tenderness.  No tenderness along spine. Pain at lumbar area when she sits up from a supine position  Neurological: She is alert and oriented to person, place, and time. Coordination normal.  Gait normal motor  strength 5 over 5 overall  Skin: Skin is warm and dry. No rash noted.  Psychiatric: She has a normal mood and affect.    ED Course  Procedures (including critical care time) Labs Review Labs Reviewed  URINALYSIS, ROUTINE W REFLEX MICROSCOPIC   Imaging Review No results found. Results for orders placed during the hospital encounter of 12/25/12  URINALYSIS, ROUTINE W REFLEX MICROSCOPIC      Result Value Range   Color, Urine YELLOW  YELLOW   APPearance CLOUDY (*) CLEAR   Specific Gravity, Urine 1.018  1.005 - 1.030   pH 6.0  5.0 - 8.0   Glucose, UA NEGATIVE  NEGATIVE mg/dL   Hgb urine dipstick NEGATIVE  NEGATIVE   Bilirubin Urine NEGATIVE  NEGATIVE   Ketones, ur NEGATIVE  NEGATIVE mg/dL   Protein, ur NEGATIVE  NEGATIVE mg/dL   Urobilinogen, UA 0.2  0.0 - 1.0 mg/dL   Nitrite NEGATIVE  NEGATIVE   Leukocytes, UA LARGE (*) NEGATIVE  URINE MICROSCOPIC-ADD ON      Result Value Range   Squamous Epithelial / LPF FEW (*) RARE   WBC, UA TOO NUMEROUS TO COUNT  <3 WBC/hpf   RBC / HPF 0-2  <3 RBC/hpf   Bacteria, UA FEW (*) RARE  POCT I-STAT, CHEM 8      Result Value Range   Sodium 141  135 - 145 mEq/L   Potassium 3.5  3.5 - 5.1 mEq/L   Chloride 102  96 - 112 mEq/L   BUN 12  6 - 23 mg/dL   Creatinine, Ser 4.09  0.50 - 1.10 mg/dL   Glucose, Bld 811 (*) 70 - 99 mg/dL   Calcium, Ion 9.14  7.82 - 1.23 mmol/L   TCO2 27  0 - 100 mmol/L   Hemoglobin 14.3  12.0 - 15.0 g/dL   HCT 95.6  21.3 - 08.6 %   Dg Chest 2 View  12/06/2012   *RADIOLOGY REPORT*  Clinical Data: Chest tightness, chills, wheezing  CHEST - 2 VIEW  Comparison: Chest x-ray of 08/10/2012  Findings: The lungs are not well aerated.  However, no infiltrate or effusion is seen.  Heart is borderline enlarged and stable.  No bony abnormality is seen.  IMPRESSION: Stable chest x-ray.  No active lung disease.   Original Report Authenticated By: Dwyane Dee, M.D.    MDM  No diagnosis  found. In light of low back pain and suprapubic  pain we'll treat empirically for urinary tract infection plan prescription Macrobid. Norco. Followup Dr. Pecola Leisure if not better in a few days. Urine culture. Blood pressure recheck 3 weeks. Diagnosis #1 urinary tract infection #2 hypertension #3 hyperglycemia    Doug Sou, MD 12/25/12 [phone]

## 2012-12-26 LAB — URINE CULTURE

## 2012-12-27 NOTE — Progress Notes (Signed)
ED Antimicrobial Stewardship Positive Culture Follow Up   [name] is an 52 y.o. female who presented to [facility] on 12/25/2012 with a chief complaint of  Chief Complaint  Patient presents with  . Back Pain  . Groin Pain    Recent Results (from the past 720 hour(s))  URINE CULTURE     Status: None   Collection Time    12/25/12  7:15 AM      Result Value Range Status   Specimen Description URINE, CLEAN CATCH   Final   Special Requests NONE   Final   Culture  Setup Time     Final   Value: 12/25/2012 07:28     Performed at [facility] Count     Final   Value: >=100,000 COLONIES/ML     Performed at [facility]   Culture     Final   Value: ESCHERICHIA COLI     Performed at [facility]   Report Status 12/26/2012 FINAL   Final   Organism ID, Bacteria ESCHERICHIA COLI   Final    [x]  Treated with Nitrofurantoin, organism is intermediate to prescribed antimicrobial  Flow manager will attempt to contact patient for a symptom check. If her symptoms are improved she should finish her course of Nitrofurantoin as prescribed. If her symptoms are not improved she should stop Nitrofurantoin and fill the below prescription.  New antibiotic prescription: Cipro 500mg  PO BID x 7 days  ED Provider: Jaynie Crumble, PA-C  Sallee Provencal 12/27/2012, 11:35 AM Infectious Diseases Pharmacist Phone# [phone]

## 2012-12-27 NOTE — ED Notes (Addendum)
Post ED Visit - Positive Culture Follow-up: Successful Patient Follow-Up  Culture assessed and recommendations reviewed by: []  Wes Dulaney, Pharm.D., BCPS []  Celedonio Miyamoto, [address].D., BCPS []  Georgina Pillion, [address].D., BCPS []  Van, [address].D., BCPS, AAHIVP [x]  Estella Husk, Pharm.D., BCPS, AAHIVP  Positive urine culture  []  Patient discharged without antimicrobial prescription and treatment is now indicated [x]  Organism is resistant to prescribed ED discharge antimicrobial []  Patient with positive blood cultures  Changes discussed with ED provider: Orlean Bradford New antibiotic prescription Cipro 500 mg po BID x 7 days  Patient informed of positive results and request that rx be called to [facility] - [phone].  Larena Sox 12/27/2012, 5:34 PM

## 2012-12-28 ENCOUNTER — Emergency Department (HOSPITAL_COMMUNITY)
Admission: EM | Admit: 2012-12-28 | Discharge: 2012-12-28 | Disposition: A | Discharge status: Home / Self Care | Payer: BC Managed Care – PPO | Attending: Emergency Medicine | Admitting: Emergency Medicine

## 2012-12-28 ENCOUNTER — Encounter (HOSPITAL_COMMUNITY): Payer: Self-pay | Admitting: Emergency Medicine

## 2012-12-28 DIAGNOSIS — I10 Essential (primary) hypertension: Secondary | ICD-10-CM | POA: Insufficient documentation

## 2012-12-28 DIAGNOSIS — N12 Tubulo-interstitial nephritis, not specified as acute or chronic: Secondary | ICD-10-CM | POA: Insufficient documentation

## 2012-12-28 DIAGNOSIS — Z79899 Other long term (current) drug therapy: Secondary | ICD-10-CM | POA: Insufficient documentation

## 2012-12-28 DIAGNOSIS — R112 Nausea with vomiting, unspecified: Secondary | ICD-10-CM | POA: Insufficient documentation

## 2012-12-28 DIAGNOSIS — E119 Type 2 diabetes mellitus without complications: Secondary | ICD-10-CM | POA: Insufficient documentation

## 2012-12-28 DIAGNOSIS — Z7982 Long term (current) use of aspirin: Secondary | ICD-10-CM | POA: Insufficient documentation

## 2012-12-28 LAB — POCT I-STAT, CHEM 8
BUN: 19 mg/dL (ref 6–23)
Chloride: 103 mEq/L (ref 96–112)
Creatinine, Ser: 0.9 mg/dL (ref 0.50–1.10)
Glucose, Bld: 171 mg/dL — ABNORMAL HIGH (ref 70–99)
Potassium: 3.5 mEq/L (ref 3.5–5.1)

## 2012-12-28 MED ORDER — ONDANSETRON HCL 4 MG/2ML IJ SOLN: 4.0000 mg | Freq: Once | INTRAMUSCULAR | Status: DC

## 2012-12-28 MED ORDER — PHENAZOPYRIDINE HCL 200 MG PO TABS: 200.0000 mg | ORAL_TABLET | Freq: Three times a day (TID) | ORAL | Status: DC

## 2012-12-28 MED ORDER — LORAZEPAM 2 MG/ML IJ SOLN
0.5000 mg | Freq: Once | INTRAMUSCULAR | Status: AC
  Administered 2012-12-28: 0.5 mg via INTRAVENOUS
  Filled 2012-12-28: qty 1

## 2012-12-28 MED ORDER — PROMETHAZINE HCL 25 MG PO TABS: 25.0000 mg | ORAL_TABLET | Freq: Four times a day (QID) | ORAL | Status: DC | PRN

## 2012-12-28 MED ORDER — SODIUM CHLORIDE 0.9 % IV BOLUS (SEPSIS)
1000.0000 mL | Freq: Once | INTRAVENOUS | Status: AC
  Administered 2012-12-28: 1000 mL via INTRAVENOUS

## 2012-12-28 MED ORDER — CIPROFLOXACIN IN D5W 400 MG/200ML IV SOLN
400.0000 mg | Freq: Once | INTRAVENOUS | Status: AC
  Administered 2012-12-28: 400 mg via INTRAVENOUS
  Filled 2012-12-28: qty 200

## 2012-12-28 MED ORDER — CIPROFLOXACIN HCL 500 MG PO TABS: 500.0000 mg | ORAL_TABLET | Freq: Two times a day (BID) | ORAL | Status: DC

## 2012-12-28 NOTE — ED Provider Notes (Signed)
Assumed care at signout to reassess after antibiotics Pt is resting comfortably, no distress.  She feels comfortable for d/c home BP 136/87  Pulse 62  Temp(Src) 97.8 F (36.6 C) (Oral)  Resp 21  SpO2 98%   Joya Gaskins, MD 12/28/12 [phone]

## 2012-12-28 NOTE — ED Provider Notes (Addendum)
CSN: [id number]     Arrival date & time 12/28/12  4098 History   First MD Initiated Contact with Patient 12/28/12 0518     No chief complaint on file.  (Consider location/radiation/quality/duration/timing/severity/associated sxs/prior Treatment) HPI Comments: Pt seen here on Monday with c/o of dysuria, frequency/urgency who was contacted yesterday about her culture results which were insensitive to macrobid.  She said they were calling in cipro for her but when her husband went to the pharmacy they did not have it.  Also yesterday pt began to develop right flank pain and 2 episodes of vomiting over night and chills.  Persistent dysuria, frequency/urgency.  The history is provided by the patient.    Past Medical History  Diagnosis Date  . Diabetes mellitus   . Hypertension    Past Surgical History  Procedure Laterality Date  . Colon surgery    . Abdominal hysterectomy    . Tonsillectomy    . Knee arthroscopy     No family history on file. History  Substance Use Topics  . Smoking status: Never Smoker   . Smokeless tobacco: Not on file  . Alcohol Use: No   OB History   Grav Para Term Preterm Abortions TAB SAB Ect Mult Living                 Review of Systems  Constitutional: Positive for chills. Negative for fever.  Respiratory: Negative for cough and shortness of breath.   Gastrointestinal: Positive for nausea, vomiting and abdominal distention.  Genitourinary: Positive for dysuria, urgency and frequency.  All other systems reviewed and are negative.    Allergies  Lorabid; Codeine; Sulfa drugs cross reactors; Benicar; and Other  Home Medications   Current Outpatient Rx  Name  Route  Sig  Dispense  Refill  . Acetaminophen (TYLENOL PO)   Oral   Take 3 tablets by mouth 2 (two) times daily as needed (for pain).         Marland Kitchen albuterol (PROVENTIL HFA;VENTOLIN HFA) 108 (90 BASE) MCG/ACT inhaler   Inhalation   Inhale 2 puffs into the lungs 2 (two) times daily as needed  for wheezing or shortness of breath.         Marland Kitchen aspirin EC 325 MG tablet   Oral   Take 325 mg by mouth daily.         Marland Kitchen atenolol (TENORMIN) 50 MG tablet   Oral   Take 50 mg by mouth 2 (two) times daily.         . B Complex Vitamins (VITAMIN B COMPLEX PO)   Oral   Take 1 tablet by mouth daily.         . furosemide (LASIX) 40 MG tablet   Oral   Take 80 mg by mouth 2 (two) times daily.          Marland Kitchen HYDROcodone-acetaminophen (NORCO) 5-325 MG per tablet   Oral   Take 1 tablet by mouth every 4 (four) hours as needed for pain.   10 tablet   0   . ibuprofen (ADVIL,MOTRIN) 200 MG tablet   Oral   Take 800 mg by mouth 2 (two) times daily as needed for pain.         . meloxicam (MOBIC) 15 MG tablet   Oral   Take 15 mg by mouth daily.         . metFORMIN (GLUCOPHAGE) 1000 MG tablet   Oral   Take 1,000 mg by mouth 2 (two) times  daily with a meal.         . nitrofurantoin, macrocrystal-monohydrate, (MACROBID) 100 MG capsule   Oral   Take 1 capsule (100 mg total) by mouth 2 (two) times daily. X 7 days   14 capsule   0   . Omega-3 Fatty Acids (FISH OIL PO)   Oral   Take 1 capsule by mouth daily.         . potassium chloride SA (K-DUR,KLOR-CON) 20 MEQ tablet   Oral   Take 40 mEq by mouth 2 (two) times daily.           . Probiotic Product (PROBIOTIC PO)   Oral   Take 1 tablet by mouth daily.          BP 136/87  Pulse 62  Temp(Src) 97.8 F (36.6 C) (Oral)  Resp 21  SpO2 98% Physical Exam  Nursing note and vitals reviewed. Constitutional: She is oriented to person, place, and time. She appears well-developed and well-nourished. No distress.  HENT:  Head: Normocephalic and atraumatic.  Mouth/Throat: Oropharynx is clear and moist.  Eyes: Conjunctivae and EOM are normal. Pupils are equal, round, and reactive to light.  Neck: Normal range of motion. Neck supple.  Cardiovascular: Normal rate, regular rhythm and intact distal pulses.   No murmur  heard. Pulmonary/Chest: Effort normal and breath sounds normal. No respiratory distress. She has no wheezes. She has no rales.  Abdominal: Soft. She exhibits no distension. There is tenderness in the suprapubic area. There is CVA tenderness. There is no rebound and no guarding.  Right CVA tenderness  Musculoskeletal: Normal range of motion. She exhibits no edema and no tenderness.  Neurological: She is alert and oriented to person, place, and time.  Skin: Skin is warm and dry. No rash noted. No erythema.  Psychiatric: She has a normal mood and affect. Her behavior is normal.    ED Course  Procedures (including critical care time) Labs Review Labs Reviewed  POCT I-STAT, CHEM 8 - Abnormal; Notable for the following:    Glucose, Bld 171 (*)    Calcium, Ion 1.09 (*)    All other components within normal limits   Imaging Review No results found.  MDM   1. Pyelonephritis     Patient seen on Monday and diagnosed with a UTI and discharged home with Macrobid however she was called yesterday and told that the Macrobid was not sensitive to the Escherichia coli she grew out of culture. She states they told her they were calling in the Cipro however she never received it at the pharmacy. And last night she developed chills and vomiting and has had some mild right flank pain. Patient's is well appearing on exam with normal vital signs but she does have right flank pain and suprapubic tenderness and with her vomiting feel that she started to develop pyelonephritis. Patient given dose of IV Cipro here as well as antinausea medications. I-STAT without changing creatinine. We'll discharge home with new prescription.  5:59 AM I-stat with normal and stable cr.  Pt feeling better after fluids and abx.  Will d/c home.  Gwyneth Sprout, MD 12/28/12 1610  Gwyneth Sprout, MD 12/28/12 [phone]

## 2012-12-28 NOTE — ED Notes (Signed)
Dr. Wickline at the bedside.  

## 2012-12-28 NOTE — ED Notes (Signed)
Pt was seen here on Monday and diagnosed with a UTI. Pt reports "they called me and said they gave me the wrong antibiotic." Pt reports fever and chills.

## 2013-06-28 ENCOUNTER — Encounter (HOSPITAL_COMMUNITY): Payer: Self-pay | Admitting: Emergency Medicine

## 2013-06-28 ENCOUNTER — Emergency Department (HOSPITAL_COMMUNITY): Payer: BC Managed Care – PPO

## 2013-06-28 ENCOUNTER — Emergency Department (HOSPITAL_COMMUNITY)
Admission: EM | Admit: 2013-06-28 | Discharge: 2013-06-28 | Disposition: A | Discharge status: Home / Self Care | Payer: BC Managed Care – PPO | Attending: Emergency Medicine | Admitting: Emergency Medicine

## 2013-06-28 DIAGNOSIS — Z791 Long term (current) use of non-steroidal anti-inflammatories (NSAID): Secondary | ICD-10-CM | POA: Insufficient documentation

## 2013-06-28 DIAGNOSIS — E119 Type 2 diabetes mellitus without complications: Secondary | ICD-10-CM | POA: Insufficient documentation

## 2013-06-28 DIAGNOSIS — I10 Essential (primary) hypertension: Secondary | ICD-10-CM | POA: Insufficient documentation

## 2013-06-28 DIAGNOSIS — Z792 Long term (current) use of antibiotics: Secondary | ICD-10-CM | POA: Insufficient documentation

## 2013-06-28 DIAGNOSIS — J40 Bronchitis, not specified as acute or chronic: Secondary | ICD-10-CM | POA: Insufficient documentation

## 2013-06-28 DIAGNOSIS — R6883 Chills (without fever): Secondary | ICD-10-CM | POA: Insufficient documentation

## 2013-06-28 DIAGNOSIS — Z7982 Long term (current) use of aspirin: Secondary | ICD-10-CM | POA: Insufficient documentation

## 2013-06-28 DIAGNOSIS — Z79899 Other long term (current) drug therapy: Secondary | ICD-10-CM | POA: Insufficient documentation

## 2013-06-28 LAB — CBC WITH DIFFERENTIAL/PLATELET
Basophils Absolute: 0 10*3/uL (ref 0.0–0.1)
Basophils Relative: 0 % (ref 0–1)
Eosinophils Absolute: 0.4 10*3/uL (ref 0.0–0.7)
Eosinophils Relative: 5 % (ref 0–5)
HCT: 37.2 % (ref 36.0–46.0)
Hemoglobin: 12.1 g/dL (ref 12.0–15.0)
Lymphocytes Relative: 24 % (ref 12–46)
Lymphs Abs: 2.2 10*3/uL (ref 0.7–4.0)
MCH: 30 pg (ref 26.0–34.0)
MCHC: 32.5 g/dL (ref 30.0–36.0)
MCV: 92.1 fL (ref 78.0–100.0)
Monocytes Absolute: 0.6 10*3/uL (ref 0.1–1.0)
Monocytes Relative: 7 % (ref 3–12)
Neutro Abs: 5.7 10*3/uL (ref 1.7–7.7)
Neutrophils Relative %: 64 % (ref 43–77)
Platelets: 242 10*3/uL (ref 150–400)
RBC: 4.04 MIL/uL (ref 3.87–5.11)
RDW: 13.7 % (ref 11.5–15.5)
WBC: 8.9 10*3/uL (ref 4.0–10.5)

## 2013-06-28 LAB — COMPREHENSIVE METABOLIC PANEL
ALT: 16 U/L (ref 0–35)
AST: 15 U/L (ref 0–37)
Albumin: 3.4 g/dL — ABNORMAL LOW (ref 3.5–5.2)
Alkaline Phosphatase: 81 U/L (ref 39–117)
BUN: 13 mg/dL (ref 6–23)
CO2: 25 mEq/L (ref 19–32)
Calcium: 8.2 mg/dL — ABNORMAL LOW (ref 8.4–10.5)
Chloride: 103 mEq/L (ref 96–112)
Creatinine, Ser: 0.64 mg/dL (ref 0.50–1.10)
GFR calc Af Amer: >90 mL/min (ref >90)
GFR calc non Af Amer: >90 mL/min (ref >90)
Glucose, Bld: 204 mg/dL — ABNORMAL HIGH (ref 70–99)
Potassium: 4 mEq/L (ref 3.7–5.3)
Sodium: 142 mEq/L (ref 137–147)
Total Bilirubin: 0.4 mg/dL (ref 0.3–1.2)
Total Protein: 6.8 g/dL (ref 6.0–8.3)

## 2013-06-28 LAB — URINALYSIS, ROUTINE W REFLEX MICROSCOPIC
Bilirubin Urine: NEGATIVE
Glucose, UA: NEGATIVE mg/dL
Hgb urine dipstick: NEGATIVE
Ketones, ur: NEGATIVE mg/dL
Leukocytes, UA: NEGATIVE
Nitrite: NEGATIVE
Protein, ur: NEGATIVE mg/dL
Specific Gravity, Urine: 1.014 (ref 1.005–1.030)
Urobilinogen, UA: 0.2 mg/dL (ref 0.0–1.0)
pH: 6 (ref 5.0–8.0)

## 2013-06-28 MED ORDER — IPRATROPIUM BROMIDE 0.02 % IN SOLN
0.5000 mg | Freq: Once | RESPIRATORY_TRACT | Status: AC
  Administered 2013-06-28: 0.5 mg via RESPIRATORY_TRACT
  Filled 2013-06-28: qty 2.5

## 2013-06-28 MED ORDER — PREDNISONE 20 MG PO TABS
60.0000 mg | ORAL_TABLET | Freq: Once | ORAL | Status: AC
  Administered 2013-06-28: 60 mg via ORAL
  Filled 2013-06-28: qty 3

## 2013-06-28 MED ORDER — GUAIFENESIN ER 1200 MG PO TB12: 1.0000 | ORAL_TABLET | Freq: Two times a day (BID) | ORAL | Status: DC

## 2013-06-28 MED ORDER — PREDNISONE 50 MG PO TABS: 50.0000 mg | ORAL_TABLET | Freq: Every day | ORAL | Status: DC

## 2013-06-28 MED ORDER — PROMETHAZINE-DM 6.25-15 MG/5ML PO SYRP: 5.0000 mL | ORAL_SOLUTION | Freq: Four times a day (QID) | ORAL | Status: DC | PRN

## 2013-06-28 MED ORDER — ALBUTEROL SULFATE (2.5 MG/3ML) 0.083% IN NEBU
5.0000 mg | INHALATION_SOLUTION | Freq: Once | RESPIRATORY_TRACT | Status: AC
  Administered 2013-06-28: 5 mg via RESPIRATORY_TRACT
  Filled 2013-06-28: qty 6

## 2013-06-28 NOTE — ED Provider Notes (Signed)
CSN: [id number]     Arrival date & time 06/28/13  0606 History   First MD Initiated Contact with Patient 06/28/13 [phone]     Chief Complaint  Patient presents with  . Shortness of Breath     (Consider location/radiation/quality/duration/timing/severity/associated sxs/prior Treatment) HPI: Ms. Risk is a 53 year old woman with past medical history of diabetes and hypertension who presents to the Emergency Department with chief complaint of trouble breathing and productive cough.  She reports that she has had approximately 1 month of "allergies," including watering eyes and face pressure.  One week ago she began having "chest tightness" and "trouble breathing."  She used her albuterol inhaler and flonase with no relief of symptoms.  The dyspnea is worse with exertion.  Yesterday she began coughing and last night the cough became productive of green sputum.  She endorses chills but has not checked her temperature to see if she is febrile.  Denies anorexia, nausea, vomiting, chest pain.  She is not a smoker and has no PMH of asthma or COPD.    Past Medical History  Diagnosis Date  . Diabetes mellitus   . Hypertension    Past Surgical History  Procedure Laterality Date  . Colon surgery    . Abdominal hysterectomy    . Tonsillectomy    . Knee arthroscopy    . Cholecystectomy     History reviewed. No pertinent family history. History  Substance Use Topics  . Smoking status: Never Smoker   . Smokeless tobacco: Not on file  . Alcohol Use: No   OB History   Grav Para Term Preterm Abortions TAB SAB Ect Mult Living                 Review of Systems  All other systems negative except as documented in the HPI. All pertinent positives and negatives as reviewed in the HPI.  Allergies  Lorabid; Codeine; Sulfa drugs cross reactors; Benicar; and Other  Home Medications   Current Outpatient Rx  Name  Route  Sig  Dispense  Refill  . acetaminophen (TYLENOL) 325 MG tablet   Oral   Take 975  mg by mouth every 6 (six) hours as needed for pain.         Marland Kitchen albuterol (PROVENTIL HFA;VENTOLIN HFA) 108 (90 BASE) MCG/ACT inhaler   Inhalation   Inhale 2 puffs into the lungs 2 (two) times daily as needed for wheezing or shortness of breath.         Marland Kitchen aspirin EC 325 MG tablet   Oral   Take 325 mg by mouth daily.         Marland Kitchen atenolol (TENORMIN) 50 MG tablet   Oral   Take 50 mg by mouth 2 (two) times daily.         . B Complex Vitamins (VITAMIN B COMPLEX PO)   Oral   Take 1 tablet by mouth daily.         . furosemide (LASIX) 40 MG tablet   Oral   Take 80 mg by mouth 2 (two) times daily.          Marland Kitchen HYDROcodone-acetaminophen (NORCO/VICODIN) 5-325 MG per tablet   Oral   Take 1 tablet by mouth every 4 (four) hours as needed for pain.         Marland Kitchen ibuprofen (ADVIL,MOTRIN) 200 MG tablet   Oral   Take 200 mg by mouth every 6 (six) hours as needed for moderate pain.         Marland Kitchen  meloxicam (MOBIC) 15 MG tablet   Oral   Take 15 mg by mouth daily.         . metFORMIN (GLUCOPHAGE) 1000 MG tablet   Oral   Take 1,000 mg by mouth 2 (two) times daily with a meal.         . Omega-3 Fatty Acids (FISH OIL PO)   Oral   Take 1 capsule by mouth daily.         . phenazopyridine (PYRIDIUM) 200 MG tablet   Oral   Take 200 mg by mouth daily.         . potassium chloride SA (K-DUR,KLOR-CON) 20 MEQ tablet   Oral   Take 40 mEq by mouth 2 (two) times daily.          . Probiotic Product (PROBIOTIC PO)   Oral   Take 1 tablet by mouth daily.         . ciprofloxacin (CIPRO) 500 MG tablet   Oral   Take 1 tablet (500 mg total) by mouth 2 (two) times daily.   20 tablet   0    BP 128/65  Pulse 63  Temp(Src) 97.8 F (36.6 C) (Oral)  Resp 18  Ht 5\' 6"  (1.676 m)  Wt 248 lb (112.492 kg)  BMI 40.05 kg/m2  SpO2 98% Physical Exam  Nursing note and vitals reviewed. Constitutional: She is oriented to person, place, and time. She appears well-developed and well-nourished.  No distress.  HENT:  Head: Normocephalic and atraumatic.  Eyes: Pupils are equal, round, and reactive to light.  Neck: Normal range of motion. Neck supple.  Cardiovascular: Normal rate, regular rhythm and normal heart sounds.   Pulmonary/Chest: Effort normal. No respiratory distress. She has wheezes.  Wheezes in bilateral bases; crackles in right middle lobe  Musculoskeletal: She exhibits no edema.  Neurological: She is alert and oriented to person, place, and time. She exhibits normal muscle tone. Coordination normal.  Skin: Skin is warm and dry. No rash noted. No erythema.    ED Course  Procedures (including critical care time) Labs Review Labs Reviewed  CBC WITH DIFFERENTIAL  COMPREHENSIVE METABOLIC PANEL  URINALYSIS, ROUTINE W REFLEX MICROSCOPIC   Patient will be treated for bronchitis and URI.  Told to return here as needed.  Advised patient to follow up with her primary care Dr. told to increase her fluid intake, and rest as much as possible.  Patient, agrees to the plan and all questions were answered.  Patient, in stable here in the emergency department and has ambulated without, difficulty or shortness of breath to the restroom   Brent General, PA-C 06/30/13 1533

## 2013-06-28 NOTE — ED Notes (Signed)
PA student at bedside.

## 2013-06-28 NOTE — Discharge Instructions (Signed)
Return here as needed. Follow up with your doctor. Increase your fluid intake. °

## 2013-06-28 NOTE — ED Notes (Signed)
Pt presents from home with c/o of shortness of breath that has been ongoing x 1 week.  Pt reports to coughing up green flem.  Denies fever.

## 2013-07-02 NOTE — ED Provider Notes (Signed)
Medical screening examination/treatment/procedure(s) were performed by non-physician practitioner and as supervising physician I was immediately available for consultation/collaboration.   EKG Interpretation   Date/Time:  Thursday June 28 2013 07:19:04 EDT Ventricular Rate:  64 PR Interval:    QRS Duration: 90 QT Interval:  401 QTC Calculation: 414 R Axis:   -24 Text Interpretation:  Normal sinus rhythm left axis deviation Anterior  infarct, old Confirmed by Wilson Singer  MD, [place] ([id number]) on 06/28/2013 7:25:08 AM       Virgel Manifold, MD 07/02/13 [phone]

## 2013-07-24 ENCOUNTER — Emergency Department (HOSPITAL_COMMUNITY): Payer: BC Managed Care – PPO

## 2013-07-24 ENCOUNTER — Encounter (HOSPITAL_COMMUNITY): Payer: Self-pay | Admitting: Emergency Medicine

## 2013-07-24 ENCOUNTER — Emergency Department (HOSPITAL_COMMUNITY)
Admission: EM | Admit: 2013-07-24 | Discharge: 2013-07-24 | Disposition: A | Discharge status: Home / Self Care | Payer: BC Managed Care – PPO | Attending: Emergency Medicine | Admitting: Emergency Medicine

## 2013-07-24 DIAGNOSIS — Z791 Long term (current) use of non-steroidal anti-inflammatories (NSAID): Secondary | ICD-10-CM | POA: Insufficient documentation

## 2013-07-24 DIAGNOSIS — IMO0002 Reserved for concepts with insufficient information to code with codable children: Secondary | ICD-10-CM | POA: Insufficient documentation

## 2013-07-24 DIAGNOSIS — R1110 Vomiting, unspecified: Secondary | ICD-10-CM

## 2013-07-24 DIAGNOSIS — B372 Candidiasis of skin and nail: Secondary | ICD-10-CM | POA: Insufficient documentation

## 2013-07-24 DIAGNOSIS — I10 Essential (primary) hypertension: Secondary | ICD-10-CM | POA: Insufficient documentation

## 2013-07-24 DIAGNOSIS — E663 Overweight: Secondary | ICD-10-CM | POA: Insufficient documentation

## 2013-07-24 DIAGNOSIS — R197 Diarrhea, unspecified: Secondary | ICD-10-CM | POA: Insufficient documentation

## 2013-07-24 DIAGNOSIS — E119 Type 2 diabetes mellitus without complications: Secondary | ICD-10-CM | POA: Insufficient documentation

## 2013-07-24 DIAGNOSIS — R109 Unspecified abdominal pain: Secondary | ICD-10-CM

## 2013-07-24 DIAGNOSIS — R112 Nausea with vomiting, unspecified: Secondary | ICD-10-CM | POA: Insufficient documentation

## 2013-07-24 DIAGNOSIS — R011 Cardiac murmur, unspecified: Secondary | ICD-10-CM | POA: Insufficient documentation

## 2013-07-24 DIAGNOSIS — Z7982 Long term (current) use of aspirin: Secondary | ICD-10-CM | POA: Insufficient documentation

## 2013-07-24 DIAGNOSIS — Z792 Long term (current) use of antibiotics: Secondary | ICD-10-CM | POA: Insufficient documentation

## 2013-07-24 DIAGNOSIS — R1084 Generalized abdominal pain: Secondary | ICD-10-CM | POA: Insufficient documentation

## 2013-07-24 DIAGNOSIS — R34 Anuria and oliguria: Secondary | ICD-10-CM | POA: Insufficient documentation

## 2013-07-24 DIAGNOSIS — R609 Edema, unspecified: Secondary | ICD-10-CM | POA: Insufficient documentation

## 2013-07-24 DIAGNOSIS — Z79899 Other long term (current) drug therapy: Secondary | ICD-10-CM | POA: Insufficient documentation

## 2013-07-24 HISTORY — DX: Pure hypercholesterolemia, unspecified: E78.00

## 2013-07-24 HISTORY — DX: Cardiac murmur, unspecified: R01.1

## 2013-07-24 LAB — BASIC METABOLIC PANEL
BUN: 14 mg/dL (ref 6–23)
CALCIUM: 9.3 mg/dL (ref 8.4–10.5)
CHLORIDE: 104 meq/L (ref 96–112)
CO2: 25 mEq/L (ref 19–32)
CREATININE: 0.67 mg/dL (ref 0.50–1.10)
GFR calc non Af Amer: >90 mL/min (ref >90)
Glucose, Bld: 202 mg/dL — ABNORMAL HIGH (ref 70–99)
Potassium: 3.8 mEq/L (ref 3.7–5.3)
Sodium: 144 mEq/L (ref 137–147)

## 2013-07-24 LAB — CBC WITH DIFFERENTIAL/PLATELET
BASOS ABS: 0 10*3/uL (ref 0.0–0.1)
Basophils Relative: 0 % (ref 0–1)
Eosinophils Absolute: 0.4 10*3/uL (ref 0.0–0.7)
Eosinophils Relative: 5 % (ref 0–5)
HEMATOCRIT: 40.9 % (ref 36.0–46.0)
Hemoglobin: 13.3 g/dL (ref 12.0–15.0)
Lymphocytes Relative: 32 % (ref 12–46)
Lymphs Abs: 2.2 10*3/uL (ref 0.7–4.0)
MCH: 29.7 pg (ref 26.0–34.0)
MCHC: 32.5 g/dL (ref 30.0–36.0)
MCV: 91.3 fL (ref 78.0–100.0)
MONO ABS: 0.3 10*3/uL (ref 0.1–1.0)
Monocytes Relative: 4 % (ref 3–12)
Neutro Abs: 4.1 10*3/uL (ref 1.7–7.7)
Neutrophils Relative %: 59 % (ref 43–77)
Platelets: 263 10*3/uL (ref 150–400)
RBC: 4.48 MIL/uL (ref 3.87–5.11)
RDW: 13.6 % (ref 11.5–15.5)
WBC: 7 10*3/uL (ref 4.0–10.5)

## 2013-07-24 LAB — URINALYSIS, ROUTINE W REFLEX MICROSCOPIC
BILIRUBIN URINE: NEGATIVE
Glucose, UA: NEGATIVE mg/dL
Hgb urine dipstick: NEGATIVE
KETONES UR: NEGATIVE mg/dL
Leukocytes, UA: NEGATIVE
Nitrite: NEGATIVE
Protein, ur: NEGATIVE mg/dL
Specific Gravity, Urine: 1.017 (ref 1.005–1.030)
UROBILINOGEN UA: 0.2 mg/dL (ref 0.0–1.0)
pH: 6 (ref 5.0–8.0)

## 2013-07-24 LAB — CBG MONITORING, ED: Glucose-Capillary: 178 mg/dL — ABNORMAL HIGH (ref 70–99)

## 2013-07-24 LAB — LIPASE, BLOOD: Lipase: 48 U/L (ref 11–59)

## 2013-07-24 MED ORDER — MORPHINE SULFATE 4 MG/ML IJ SOLN
4.0000 mg | Freq: Once | INTRAMUSCULAR | Status: AC
  Administered 2013-07-24: 4 mg via INTRAVENOUS
  Filled 2013-07-24: qty 1

## 2013-07-24 MED ORDER — ONDANSETRON HCL 4 MG/2ML IJ SOLN
4.0000 mg | Freq: Once | INTRAMUSCULAR | Status: AC
  Administered 2013-07-24: 4 mg via INTRAVENOUS
  Filled 2013-07-24: qty 2

## 2013-07-24 MED ORDER — LOPERAMIDE HCL 2 MG PO CAPS: 2.0000 mg | ORAL_CAPSULE | Freq: Four times a day (QID) | ORAL | Status: DC | PRN

## 2013-07-24 MED ORDER — SODIUM CHLORIDE 0.9 % IV BOLUS (SEPSIS)
1000.0000 mL | Freq: Once | INTRAVENOUS | Status: AC
  Administered 2013-07-24: 1000 mL via INTRAVENOUS

## 2013-07-24 MED ORDER — ONDANSETRON HCL 4 MG PO TABS: 4.0000 mg | ORAL_TABLET | Freq: Three times a day (TID) | ORAL | Status: DC | PRN

## 2013-07-24 NOTE — ED Provider Notes (Signed)
CSN: [id number]     Arrival date & time 07/24/13  7829 History   None    Chief Complaint  Patient presents with  . Abdominal Pain  . Nausea     (Consider location/radiation/quality/duration/timing/severity/associated sxs/prior Treatment) HPI  This is a 53 year old female with history of diabetes, hypertension, hypercholesterolemia who presents with diarrhea and vomiting. Patient reports a two-week history of nonbloody diarrhea that comes and goes. She had 3 episodes yesterday. Patient states that she also had 2 episodes of nonbilious, nonbloody emesis yesterday and continues to feel nauseous. She denies any blood in her stool. She reports associated diffuse abdominal pain which she describes as "sore". Nothing makes it better or worse. Current pain is 7/10.  Patient reports decreased urine output no dysuria. She also reports bilateral plaque-like cramps. She denies any recent fever, travel, antibiotic use.  Past Medical History  Diagnosis Date  . Diabetes mellitus   . Hypertension   . Hypercholesteremia   . Heart murmur    Past Surgical History  Procedure Laterality Date  . Colon surgery    . Abdominal hysterectomy    . Tonsillectomy    . Knee arthroscopy    . Cholecystectomy     No family history on file. History  Substance Use Topics  . Smoking status: Never Smoker   . Smokeless tobacco: Not on file  . Alcohol Use: No   OB History   Grav Para Term Preterm Abortions TAB SAB Ect Mult Living                 Review of Systems  Constitutional: Negative for fever.  Respiratory: Negative for cough, chest tightness and shortness of breath.   Cardiovascular: Negative for chest pain.  Gastrointestinal: Positive for nausea, vomiting, abdominal pain and diarrhea. Negative for blood in stool.  Genitourinary: Positive for decreased urine volume. Negative for dysuria.  Musculoskeletal: Negative for back pain.  Skin: Positive for rash.  Neurological: Negative for headaches.  All  other systems reviewed and are negative.     Allergies  Lorabid; Codeine; Sulfa drugs cross reactors; Benicar; and Other  Home Medications   Prior to Admission medications   Medication Sig Start Date End Date Taking? Authorizing Provider  acetaminophen (TYLENOL) 325 MG tablet Take 975 mg by mouth every 6 (six) hours as needed for pain.    Historical Provider, MD  albuterol (PROVENTIL HFA;VENTOLIN HFA) 108 (90 BASE) MCG/ACT inhaler Inhale 2 puffs into the lungs 2 (two) times daily as needed for wheezing or shortness of breath.    Historical Provider, MD  aspirin EC 325 MG tablet Take 325 mg by mouth daily.    Historical Provider, MD  atenolol (TENORMIN) 50 MG tablet Take 50 mg by mouth 2 (two) times daily.    Historical Provider, MD  B Complex Vitamins (VITAMIN B COMPLEX PO) Take 1 tablet by mouth daily.    Historical Provider, MD  ciprofloxacin (CIPRO) 500 MG tablet Take 1 tablet (500 mg total) by mouth 2 (two) times daily. 12/28/12   Blanchie Dessert, MD  furosemide (LASIX) 40 MG tablet Take 80 mg by mouth 2 (two) times daily.     Historical Provider, MD  Guaifenesin 1200 MG TB12 Take 1 tablet (1,200 mg total) by mouth 2 (two) times daily. 06/28/13   Brent General, PA-C  HYDROcodone-acetaminophen (NORCO/VICODIN) 5-325 MG per tablet Take 1 tablet by mouth every 4 (four) hours as needed for pain. 12/25/12   Orlie Dakin, MD  ibuprofen (ADVIL,MOTRIN) 200 MG  tablet Take 200 mg by mouth every 6 (six) hours as needed for moderate pain.    Historical Provider, MD  meloxicam (MOBIC) 15 MG tablet Take 15 mg by mouth daily.    Historical Provider, MD  metFORMIN (GLUCOPHAGE) 1000 MG tablet Take 1,000 mg by mouth 2 (two) times daily with a meal.    Historical Provider, MD  Omega-3 Fatty Acids (FISH OIL PO) Take 1 capsule by mouth daily.    Historical Provider, MD  phenazopyridine (PYRIDIUM) 200 MG tablet Take 200 mg by mouth daily.    Historical Provider, MD  potassium chloride SA  (K-DUR,KLOR-CON) 20 MEQ tablet Take 40 mEq by mouth 2 (two) times daily.     Historical Provider, MD  predniSONE (DELTASONE) 50 MG tablet Take 1 tablet (50 mg total) by mouth daily. 06/28/13   [place], PA-C  Probiotic Product (PROBIOTIC PO) Take 1 tablet by mouth daily.    Historical Provider, MD  promethazine-dextromethorphan (PROMETHAZINE-DM) 6.25-15 MG/5ML syrup Take 5 mLs by mouth 4 (four) times daily as needed for cough. 06/28/13   Resa Miner Lawyer, PA-C   BP 121/59  Pulse 64  Temp(Src) 98.4 F (36.9 C) (Oral)  Resp 20  SpO2 96% Physical Exam  Nursing note and vitals reviewed. Constitutional: She is oriented to person, place, and time. She appears well-developed and well-nourished. No distress.  Chronically ill-appearing, overweight  HENT:  Head: Normocephalic and atraumatic.  Mucous membranes dry  Eyes: Pupils are equal, round, and reactive to light.  Neck: Neck supple.  Cardiovascular: Normal rate, regular rhythm and normal heart sounds.   No murmur heard. Pulmonary/Chest: Effort normal and breath sounds normal. No respiratory distress. She has no wheezes.  Abdominal: Soft. Bowel sounds are normal. There is no tenderness. There is no rebound and no guarding.  Hypoactive bowel sounds  Musculoskeletal:  Trace bilateral lower extremity edema  Neurological: She is alert and oriented to person, place, and time.  Skin: Skin is warm and dry. Rash noted.  Candidal infection in pannus  Psychiatric: She has a normal mood and affect.    ED Course  Procedures (including critical care time) Labs Review Labs Reviewed  BASIC METABOLIC PANEL - Abnormal; Notable for the following:    Glucose, Bld 202 (*)    All other components within normal limits  CBG MONITORING, ED - Abnormal; Notable for the following:    Glucose-Capillary 178 (*)    All other components within normal limits  CBC WITH DIFFERENTIAL  LIPASE, BLOOD  URINALYSIS, ROUTINE W REFLEX MICROSCOPIC     Imaging Review Dg Abd 1 View  07/24/2013   CLINICAL DATA:  Abdominal pain, nausea.  EXAM: ABDOMEN - 1 VIEW  COMPARISON:  Aug 10, 2012.  FINDINGS: The bowel gas pattern is normal. Status post cholecystectomy. Phleboliths are noted in the pelvis. No definite renal calculi are noted. Minimal amount of stool is seen in the descending and sigmoid colon.  IMPRESSION: No evidence of bowel obstruction or ileus.   Electronically Signed   By: Sabino Dick M.D.   On: 07/24/2013 08:12     EKG Interpretation   Date/Time:  Tuesday July 24 2013 08:46:53 EDT Ventricular Rate:  66 PR Interval:  176 QRS Duration: 95 QT Interval:  386 QTC Calculation: 404 R Axis:   -22 Text Interpretation:  Incomplete analysis due to missing data in  precordial lead(s) Sinus rhythm Borderline left axis deviation Probable  anteroseptal infarct, old Missing lead(s): V1 No significant change since  last  tracing Confirmed by Danton Palmateer  MD, [place] ([id number]) on 07/24/2013  8:56:56 AM      MDM   Final diagnoses:  Abdominal pain, vomiting, and diarrhea  Candidal skin infection    Patient presents with abdominal pain, vomiting, and diarrhea. He is nontoxic on exam. Vital signs are reassuring. Abdominal exam shows no signs of peritonitis. Patient has had no evidence of vomiting or diarrhea in the emergency room. EKG is nonischemic and have low suspicion at this time for ACS etiology. KUB is reassuring without evidence of dilated bowel it. Labs are within normal limits including urinalysis. Repeat abdominal exam is benign. Discuss with patient likelihood that this is viral in nature. No indication for CT scan at this time. Patient stated understanding and was able to tolerate by mouth prior to discharge. Patient also noted to have candidal rash of the pannus. Currently on nystatin powder from PCP. Will followup with PCP if rash does not improve.  After history, exam, and medical workup I feel the patient has been appropriately  medically screened and is safe for discharge home. Pertinent diagnoses were discussed with the patient. Patient was given return precautions.     Merryl Hacker, MD 07/24/13 [phone]

## 2013-07-24 NOTE — ED Notes (Signed)
RN of CBG 178

## 2013-07-24 NOTE — ED Notes (Signed)
Pt given ginger ale and crackers. Asked to attempt to eat and drink so we can make sure she can keep it down.

## 2013-07-24 NOTE — ED Notes (Signed)
Pt able to keep food and drink down.

## 2013-07-24 NOTE — Discharge Instructions (Signed)
Viral Gastroenteritis Viral gastroenteritis is also known as stomach flu. This condition affects the stomach and intestinal tract. It can cause sudden diarrhea and vomiting. The illness typically lasts 3 to 8 days. Most people develop an immune response that eventually gets rid of the virus. While this natural response develops, the virus can make you quite ill. CAUSES  Many different viruses can cause gastroenteritis, such as rotavirus or noroviruses. You can catch one of these viruses by consuming contaminated food or water. You may also catch a virus by sharing utensils or other personal items with an infected person or by touching a contaminated surface. SYMPTOMS  The most common symptoms are diarrhea and vomiting. These problems can cause a severe loss of body fluids (dehydration) and a body salt (electrolyte) imbalance. Other symptoms may include:  Fever.  Headache.  Fatigue.  Abdominal pain. DIAGNOSIS  Your caregiver can usually diagnose viral gastroenteritis based on your symptoms and a physical exam. A stool sample may also be taken to test for the presence of viruses or other infections. TREATMENT  This illness typically goes away on its own. Treatments are aimed at rehydration. The most serious cases of viral gastroenteritis involve vomiting so severely that you are not able to keep fluids down. In these cases, fluids must be given through an intravenous line (IV). HOME CARE INSTRUCTIONS   Drink enough fluids to keep your urine clear or pale yellow. Drink small amounts of fluids frequently and increase the amounts as tolerated.  Ask your caregiver for specific rehydration instructions.  Avoid:  Foods high in sugar.  Alcohol.  Carbonated drinks.  Tobacco.  Juice.  Caffeine drinks.  Extremely hot or cold fluids.  Fatty, greasy foods.  Too much intake of anything at one time.  Dairy products until 24 to 48 hours after diarrhea stops.  You may consume probiotics.  Probiotics are active cultures of beneficial bacteria. They may lessen the amount and number of diarrheal stools in adults. Probiotics can be found in yogurt with active cultures and in supplements.  Wash your hands well to avoid spreading the virus.  Only take over-the-counter or prescription medicines for pain, discomfort, or fever as directed by your caregiver. Do not give aspirin to children. Antidiarrheal medicines are not recommended.  Ask your caregiver if you should continue to take your regular prescribed and over-the-counter medicines.  Keep all follow-up appointments as directed by your caregiver. SEEK IMMEDIATE MEDICAL CARE IF:   You are unable to keep fluids down.  You do not urinate at least once every 6 to 8 hours.  You develop shortness of breath.  You notice blood in your stool or vomit. This may look like coffee grounds.  You have abdominal pain that increases or is concentrated in one small area (localized).  You have persistent vomiting or diarrhea.  You have a fever.  The patient is a child younger than 3 months, and he or she has a fever.  The patient is a child older than 3 months, and he or she has a fever and persistent symptoms.  The patient is a child older than 3 months, and he or she has a fever and symptoms suddenly get worse.  The patient is a baby, and he or she has no tears when crying. MAKE SURE YOU:   Understand these instructions.  Will watch your condition.  Will get help right away if you are not doing well or get worse. Document Released: 03/15/2005 Document Revised: 06/07/2011 Document Reviewed: 12/30/2010   [employer] Patient Information 2014 [employer]  

## 2013-07-24 NOTE — ED Notes (Signed)
Pt c/o intermittent diarrhea X 2 weeks, had 3 episodes yesterday. Became nauseous yesterday and vomited X 2. Denies blood in stool/emesis. Also c/o diffuse abd pain that feels "sore" along with bilateral thigh cramps and decreased urine output. Denies fever/recent travel/antibiotic use. sts a coworker has had similar symptoms lately. Pt also has a rash around lower ad/pelvic region, sts she has been seen for this and given medication but hasn't gone away yet.

## 2013-07-24 NOTE — ED Notes (Signed)
Pt returned from radiology.

## 2013-09-07 ENCOUNTER — Encounter (HOSPITAL_COMMUNITY): Payer: Self-pay | Admitting: Emergency Medicine

## 2013-09-07 ENCOUNTER — Emergency Department (HOSPITAL_COMMUNITY)
Admission: EM | Admit: 2013-09-07 | Discharge: 2013-09-07 | Disposition: A | Discharge status: Home / Self Care | Payer: BC Managed Care – PPO | Attending: Emergency Medicine | Admitting: Emergency Medicine

## 2013-09-07 ENCOUNTER — Emergency Department (HOSPITAL_COMMUNITY): Payer: BC Managed Care – PPO

## 2013-09-07 DIAGNOSIS — J069 Acute upper respiratory infection, unspecified: Secondary | ICD-10-CM | POA: Insufficient documentation

## 2013-09-07 DIAGNOSIS — Z7982 Long term (current) use of aspirin: Secondary | ICD-10-CM | POA: Insufficient documentation

## 2013-09-07 DIAGNOSIS — E7800 Pure hypercholesterolemia, unspecified: Secondary | ICD-10-CM | POA: Insufficient documentation

## 2013-09-07 DIAGNOSIS — Z791 Long term (current) use of non-steroidal anti-inflammatories (NSAID): Secondary | ICD-10-CM | POA: Insufficient documentation

## 2013-09-07 DIAGNOSIS — Z79899 Other long term (current) drug therapy: Secondary | ICD-10-CM | POA: Insufficient documentation

## 2013-09-07 DIAGNOSIS — I10 Essential (primary) hypertension: Secondary | ICD-10-CM | POA: Insufficient documentation

## 2013-09-07 DIAGNOSIS — R011 Cardiac murmur, unspecified: Secondary | ICD-10-CM | POA: Insufficient documentation

## 2013-09-07 DIAGNOSIS — B9789 Other viral agents as the cause of diseases classified elsewhere: Secondary | ICD-10-CM

## 2013-09-07 DIAGNOSIS — E119 Type 2 diabetes mellitus without complications: Secondary | ICD-10-CM | POA: Insufficient documentation

## 2013-09-07 LAB — CBC
HCT: 38.6 % (ref 36.0–46.0)
Hemoglobin: 12.4 g/dL (ref 12.0–15.0)
MCH: 29.2 pg (ref 26.0–34.0)
MCHC: 32.1 g/dL (ref 30.0–36.0)
MCV: 91 fL (ref 78.0–100.0)
Platelets: 223 10*3/uL (ref 150–400)
RBC: 4.24 MIL/uL (ref 3.87–5.11)
RDW: 13.4 % (ref 11.5–15.5)
WBC: 4.7 10*3/uL (ref 4.0–10.5)

## 2013-09-07 LAB — RAPID STREP SCREEN (MED CTR MEBANE ONLY): STREPTOCOCCUS, GROUP A SCREEN (DIRECT): NEGATIVE

## 2013-09-07 LAB — BASIC METABOLIC PANEL
BUN: 11 mg/dL (ref 6–23)
CO2: 25 mEq/L (ref 19–32)
Calcium: 9 mg/dL (ref 8.4–10.5)
Chloride: 106 mEq/L (ref 96–112)
Creatinine, Ser: 0.79 mg/dL (ref 0.50–1.10)
GFR calc Af Amer: >90 mL/min (ref >90)
Glucose, Bld: 182 mg/dL — ABNORMAL HIGH (ref 70–99)
POTASSIUM: 4.1 meq/L (ref 3.7–5.3)
SODIUM: 144 meq/L (ref 137–147)

## 2013-09-07 LAB — I-STAT TROPONIN, ED: TROPONIN I, POC: 0 ng/mL (ref 0.00–0.08)

## 2013-09-07 MED ORDER — ALBUTEROL SULFATE HFA 108 (90 BASE) MCG/ACT IN AERS: 2.0000 | INHALATION_SPRAY | RESPIRATORY_TRACT | Status: DC | PRN

## 2013-09-07 MED ORDER — IPRATROPIUM-ALBUTEROL 0.5-2.5 (3) MG/3ML IN SOLN
3.0000 mL | Freq: Once | RESPIRATORY_TRACT | Status: AC
  Administered 2013-09-07: 3 mL via RESPIRATORY_TRACT
  Filled 2013-09-07: qty 3

## 2013-09-07 NOTE — ED Provider Notes (Signed)
CSN: [id number]     Arrival date & time 09/07/13  0914 History   First MD Initiated Contact with Patient 09/07/13 [phone]     Chief Complaint  Patient presents with  . Generalized Body Aches  . Sore Throat  . Cough     (Consider location/radiation/quality/duration/timing/severity/associated sxs/prior Treatment) HPI  Patient is a 53 year old female with a history of hypertension, diabetes, hyperlipidemia who presents with approximately 6 days of cough, sore throat, and reported wheezing. She states she is overall getting worse. Her throat is very sore, and burns even with drinking water. She's been trying Coricidin HBP cold and flu as well as sore throat lozenges, without any relief. She states her entire body aches. Has not had any fevers.She states she takes Tylenol and ibuprofen chronically at baseline, for her arthritis, so she's not sure she would spike a fever. She does endorse having some muscle-like chest pain especially when she coughs, however she states this also worsens with exertion.  The chest pain does not radiate to her neck or arm, but has been going on for a few days. She denies any nausea or vomiting, also denies abdominal pain, dysuria, diarrhea.  Past Medical History  Diagnosis Date  . Diabetes mellitus   . Hypertension   . Hypercholesteremia   . Heart murmur    Past Surgical History  Procedure Laterality Date  . Colon surgery    . Abdominal hysterectomy    . Tonsillectomy    . Knee arthroscopy    . Cholecystectomy     No family history on file. History  Substance Use Topics  . Smoking status: Never Smoker   . Smokeless tobacco: Not on file  . Alcohol Use: No   OB History   Grav Para Term Preterm Abortions TAB SAB Ect Mult Living                 Review of Systems  Constitutional: Negative for fever.  HENT: Positive for sore throat.   Respiratory: Positive for cough, chest tightness and wheezing.   Gastrointestinal: Negative for nausea, vomiting,  abdominal pain and diarrhea.  Genitourinary: Negative for dysuria.  Musculoskeletal: Positive for myalgias.  All other systems reviewed and are negative.   Allergies  Lorabid; Codeine; Sulfa drugs cross reactors; Benicar; and Other  Home Medications   Prior to Admission medications   Medication Sig Start Date End Date Taking? Authorizing Provider  acetaminophen (TYLENOL) 325 MG tablet Take 975 mg by mouth every 8 (eight) hours as needed (pain).    Yes Historical Provider, MD  albuterol (PROVENTIL HFA;VENTOLIN HFA) 108 (90 BASE) MCG/ACT inhaler Inhale 2 puffs into the lungs 2 (two) times daily as needed for wheezing or shortness of breath.   Yes Historical Provider, MD  aspirin EC 325 MG tablet Take 325 mg by mouth daily.   Yes Historical Provider, MD  atenolol (TENORMIN) 50 MG tablet Take 50 mg by mouth 2 (two) times daily.   Yes Historical Provider, MD  B Complex Vitamins (VITAMIN B COMPLEX PO) Take 1 tablet by mouth daily.   Yes Historical Provider, MD  furosemide (LASIX) 40 MG tablet Take 80 mg by mouth 2 (two) times daily.    Yes Historical Provider, MD  Glucosamine-Chondroitin (MOVE FREE PO) Take 2 tablets by mouth daily.   Yes Historical Provider, MD  ibuprofen (ADVIL,MOTRIN) 200 MG tablet Take 800 mg by mouth every 8 (eight) hours as needed for moderate pain.    Yes Historical Provider, MD  meloxicam [facility])  15 MG tablet Take 15 mg by mouth daily.   Yes Historical Provider, MD  metFORMIN (GLUCOPHAGE) 1000 MG tablet Take 1,000 mg by mouth 2 (two) times daily with a meal.   Yes Historical Provider, MD  Omega-3 Fatty Acids (FISH OIL PO) Take 1 capsule by mouth daily.   Yes Historical Provider, MD  potassium chloride SA (K-DUR,KLOR-CON) 20 MEQ tablet Take 40 mEq by mouth 2 (two) times daily.    Yes Historical Provider, MD  pravastatin (PRAVACHOL) 20 MG tablet Take 20 mg by mouth daily.  06/25/13  Yes Historical Provider, MD  Probiotic Product (PROBIOTIC PO) Take 1 tablet by mouth daily.    Yes Historical Provider, MD   BP 140/83  Pulse 70  Temp(Src) 98.6 F (37 C) (Oral)  Resp 22  SpO2 100% Physical Exam  Constitutional: She is oriented to person, place, and time. She appears well-developed and well-nourished. No distress.  HENT:  Head: Normocephalic and atraumatic.  Right Ear: External ear normal.  Left Ear: External ear normal.  Mouth/Throat: Oropharynx is clear and moist. No oropharyngeal exudate.  Oropharynx erythematous but no exudates  Eyes: Conjunctivae are normal. Pupils are equal, round, and reactive to light. Left eye exhibits no discharge.  Neck: Neck supple.  Cardiovascular: Normal rate, regular rhythm and normal heart sounds.   No murmur heard. Pulmonary/Chest: Effort normal and breath sounds normal. No respiratory distress. She has no wheezes. She has no rales.  Abdominal: Soft. She exhibits no mass. There is no tenderness. There is no rebound and no guarding.  Musculoskeletal: She exhibits no edema.  Lymphadenopathy:    She has no cervical adenopathy.  Neurological: She is alert and oriented to person, place, and time.  Skin: Skin is warm and dry. No rash noted. She is not diaphoretic.  Psychiatric: She has a normal mood and affect. Her behavior is normal.    ED Course  Procedures (including critical care time) Labs Review Labs Reviewed  BASIC METABOLIC PANEL - Abnormal; Notable for the following:    Glucose, Bld 182 (*)    All other components within normal limits  RAPID STREP SCREEN  CULTURE, GROUP A STREP  CBC  I-STAT TROPOININ, ED    Imaging Review Dg Chest 2 View  09/07/2013   CLINICAL DATA:  Six day history of productive cough. Medial chest pain. Two day history of wheezing. Current history of hypertension and diabetes.  EXAM: CHEST  2 VIEW  COMPARISON:  Two-view chest x-ray 06/28/2013, 12/06/2012, 01/25/2011.  FINDINGS: Being Cardiac silhouette upper normal in size, unchanged. Thoracic aorta minimally tortuous, unchanged. Hilar and  mediastinal contours otherwise unremarkable. Lungs clear. Bronchovascular markings normal. Pulmonary vascularity normal. No visible pleural effusions. No pneumothorax. Mild degenerative changes involving the thoracic spine. No significant interval change.  IMPRESSION: No acute cardiopulmonary disease.  Stable examination.   Electronically Signed   By: Evangeline Dakin M.D.   On: 09/07/2013 11:32     EKG Interpretation   Date/Time:  Friday September 07 2013 12:56:45 EDT Ventricular Rate:  67 PR Interval:  42 QRS Duration: 94 QT Interval:  373 QTC Calculation: 394 R Axis:   -43 Text Interpretation:  Sinus rhythm Short PR interval Left axis deviation  Anterior infarct, old No change compared to prior EKG July 24 2013  Confirmed by Kenna Gilbert, KRISTEN [phone]) on 09/07/2013 1:15:20 PM      MDM   Final diagnoses:  Viral URI with cough    53 year old female who presents with cough, sore  throat, and physical exam is suggestive of viral upper respiratory infection. However, the patient is endorsing chest pain which is worse with exertion. Most likely her chest pain is musculoskeletal in nature from frequent coughing, however she does have cardiac risk factors including diabetes, hypertension, and hyperlipidemia. Will obtain labs to include CBC, BMET, troponin. Will also check EKG and chest x-ray. For her sore throat, and feel this is most likely viral, however I will check a rapid strep screen.  Update: labs unremarkable, EKG normal. Patient stable for discharge home. We'll provide refill of albuterol inhaler. Recommend over-the-counter throat spray for sore throat. Patient given instructions to followup with her PCP if her symptoms do not improve over the next several days.  Chrisandra Netters, MD Family Medicine PGY-2   Leeanne Rio, MD 09/07/13 [phone]

## 2013-09-07 NOTE — ED Notes (Signed)
Pt ambulated with NT out front door.

## 2013-09-07 NOTE — ED Provider Notes (Addendum)
I saw and evaluated the patient, reviewed the resident's note and I agree with the findings and plan.   EKG Interpretation   Date/Time:  Friday September 07 2013 12:56:45 EDT Ventricular Rate:  67 PR Interval:  42 QRS Duration: 94 QT Interval:  373 QTC Calculation: 394 R Axis:   -43 Text Interpretation:  Sinus rhythm Short PR interval Left axis deviation  Anterior infarct, old No change compared to prior EKG July 24 2013  Confirmed by Leonides Schanz,  DO, KRISTEN [phone]) on 09/07/2013 1:15:20 PM      Pt is a 53 y.o. F with history of hypertension, diabetes, hyperlipidemia who presents to the emergency department with several days of productive cough, wheezing, chest pain with coughing and shortness of breath. She states she has had subjective fevers and chills. She has no history of cardiac disease, PE or DVT, CHF. She states her chest pain is with coughing and she feels it is from her lungs. She states that she gets bronchitis annually and that this is the same symptoms as she normally has. She normally needs an albuterol inhaler. On exam, patient has no respiratory distress or hypoxia, she is very mild bibasilar expiratory wheezing but good aeration. We'll give 1 DuoNeb for symptomatic relief. Given she does have multiple risk factors for ACS, have checked troponin which is negative. Do not feel she needs serial sets she has had symptoms for several days. Chest x-ray shows no infiltrate. Likely viral bronchitis. We'll discharge her with albuterol inhaler, supportive care instructions. I do not feel she needs antibiotics at this time.  [place], DO 09/07/13 [place], DO 09/07/13 1316

## 2013-09-07 NOTE — ED Notes (Signed)
Pt refusing wheelchair.

## 2013-09-07 NOTE — ED Notes (Signed)
Pt states that she has had productive cough with yellow sputum, generalized body aches and sore throat since Sunday. pt states that she has tried OTC medications with no relief. States that symptoms are getting worse. States that she take tylenol for arthritis so she does not know if she has had a fever.

## 2013-09-07 NOTE — Discharge Instructions (Signed)
You have been diagnosed by your caregiver as having chest wall pain. SEEK IMMEDIATE MEDICAL ATTENTION IF: You develop a fever.  Your chest pains become severe or intolerable.  You develop new, unexplained symptoms (problems).  You develop shortness of breath, nausea, vomiting, sweating or feel light headed.  You develop a new cough or you cough up blood.  Upper Respiratory Infection, Adult An upper respiratory infection (URI) is also known as the common cold. It is often caused by a type of germ (virus). Colds are easily spread (contagious). You can pass it to others by kissing, coughing, sneezing, or drinking out of the same glass. Usually, you get better in 1 or 2 weeks.  HOME CARE   Only take medicine as told by your doctor.  Use a warm mist humidifier or breathe in steam from a hot shower.  Drink enough water and fluids to keep your pee (urine) clear or pale yellow.  Get plenty of rest.  Return to work when your temperature is back to normal or as told by your doctor. You may use a face mask and wash your hands to stop your cold from spreading. GET HELP RIGHT AWAY IF:   After the first few days, you feel you are getting worse.  You have questions about your medicine.  You have chills, shortness of breath, or brown or red spit (mucus).  You have yellow or brown snot (nasal discharge) or pain in the face, especially when you bend forward.  You have a fever, puffy (swollen) neck, pain when you swallow, or white spots in the back of your throat.  You have a bad headache, ear pain, sinus pain, or chest pain.  You have a high-pitched whistling sound when you breathe in and out (wheezing).  You have a lasting cough or cough up blood.  You have sore muscles or a stiff neck. MAKE SURE YOU:   Understand these instructions.  Will watch your condition.  Will get help right away if you are not doing well or get worse. Document Released: 09/01/2007 Document Revised: 06/07/2011  Document Reviewed: 07/20/2010 [facility] Patient Information 2014 [place], [place].

## 2013-09-09 LAB — CULTURE, GROUP A STREP

## 2013-12-26 ENCOUNTER — Encounter (HOSPITAL_COMMUNITY): Payer: Self-pay | Admitting: Emergency Medicine

## 2013-12-26 ENCOUNTER — Emergency Department (HOSPITAL_COMMUNITY)
Admission: EM | Admit: 2013-12-26 | Discharge: 2013-12-26 | Disposition: A | Discharge status: Home / Self Care | Payer: BC Managed Care – PPO | Attending: Emergency Medicine | Admitting: Emergency Medicine

## 2013-12-26 DIAGNOSIS — E7800 Pure hypercholesterolemia, unspecified: Secondary | ICD-10-CM | POA: Insufficient documentation

## 2013-12-26 DIAGNOSIS — M545 Low back pain, unspecified: Secondary | ICD-10-CM | POA: Diagnosis not present

## 2013-12-26 DIAGNOSIS — M549 Dorsalgia, unspecified: Secondary | ICD-10-CM | POA: Diagnosis present

## 2013-12-26 DIAGNOSIS — E119 Type 2 diabetes mellitus without complications: Secondary | ICD-10-CM | POA: Diagnosis not present

## 2013-12-26 DIAGNOSIS — R319 Hematuria, unspecified: Secondary | ICD-10-CM | POA: Insufficient documentation

## 2013-12-26 DIAGNOSIS — Z79899 Other long term (current) drug therapy: Secondary | ICD-10-CM | POA: Insufficient documentation

## 2013-12-26 DIAGNOSIS — R1030 Lower abdominal pain, unspecified: Secondary | ICD-10-CM

## 2013-12-26 DIAGNOSIS — Z7982 Long term (current) use of aspirin: Secondary | ICD-10-CM | POA: Insufficient documentation

## 2013-12-26 DIAGNOSIS — R011 Cardiac murmur, unspecified: Secondary | ICD-10-CM | POA: Insufficient documentation

## 2013-12-26 DIAGNOSIS — Z791 Long term (current) use of non-steroidal anti-inflammatories (NSAID): Secondary | ICD-10-CM | POA: Insufficient documentation

## 2013-12-26 DIAGNOSIS — Z9089 Acquired absence of other organs: Secondary | ICD-10-CM | POA: Insufficient documentation

## 2013-12-26 DIAGNOSIS — I10 Essential (primary) hypertension: Secondary | ICD-10-CM | POA: Diagnosis not present

## 2013-12-26 DIAGNOSIS — E669 Obesity, unspecified: Secondary | ICD-10-CM | POA: Diagnosis not present

## 2013-12-26 LAB — WET PREP, GENITAL
Trich, Wet Prep: NONE SEEN
WBC WET PREP: NONE SEEN
YEAST WET PREP: NONE SEEN

## 2013-12-26 LAB — COMPREHENSIVE METABOLIC PANEL
ALT: 19 U/L (ref 0–35)
ANION GAP: 14 (ref 5–15)
AST: 25 U/L (ref 0–37)
Albumin: 3.7 g/dL (ref 3.5–5.2)
Alkaline Phosphatase: 79 U/L (ref 39–117)
BUN: 19 mg/dL (ref 6–23)
CO2: 25 mEq/L (ref 19–32)
Calcium: 9.3 mg/dL (ref 8.4–10.5)
Chloride: 103 mEq/L (ref 96–112)
Creatinine, Ser: 0.95 mg/dL (ref 0.50–1.10)
GFR calc non Af Amer: 68 mL/min — ABNORMAL LOW (ref >90)
GFR, EST AFRICAN AMERICAN: 79 mL/min — AB (ref >90)
GLUCOSE: 146 mg/dL — AB (ref 70–99)
Potassium: 3.8 mEq/L (ref 3.7–5.3)
Sodium: 142 mEq/L (ref 137–147)
TOTAL PROTEIN: 7.6 g/dL (ref 6.0–8.3)
Total Bilirubin: 0.4 mg/dL (ref 0.3–1.2)

## 2013-12-26 LAB — CBC WITH DIFFERENTIAL/PLATELET
BASOS PCT: 0 % (ref 0–1)
Basophils Absolute: 0 10*3/uL (ref 0.0–0.1)
EOS ABS: 0.3 10*3/uL (ref 0.0–0.7)
EOS PCT: 5 % (ref 0–5)
HCT: 38.3 % (ref 36.0–46.0)
Hemoglobin: 12.6 g/dL (ref 12.0–15.0)
LYMPHS ABS: 2.8 10*3/uL (ref 0.7–4.0)
Lymphocytes Relative: 41 % (ref 12–46)
MCH: 29 pg (ref 26.0–34.0)
MCHC: 32.9 g/dL (ref 30.0–36.0)
MCV: 88.2 fL (ref 78.0–100.0)
MONOS PCT: 6 % (ref 3–12)
Monocytes Absolute: 0.4 10*3/uL (ref 0.1–1.0)
Neutro Abs: 3.4 10*3/uL (ref 1.7–7.7)
Neutrophils Relative %: 48 % (ref 43–77)
Platelets: 270 10*3/uL (ref 150–400)
RBC: 4.34 MIL/uL (ref 3.87–5.11)
RDW: 13.7 % (ref 11.5–15.5)
WBC: 6.9 10*3/uL (ref 4.0–10.5)

## 2013-12-26 LAB — URINALYSIS, ROUTINE W REFLEX MICROSCOPIC
BILIRUBIN URINE: NEGATIVE
Glucose, UA: NEGATIVE mg/dL
HGB URINE DIPSTICK: NEGATIVE
KETONES UR: NEGATIVE mg/dL
Leukocytes, UA: NEGATIVE
Nitrite: NEGATIVE
PH: 5 (ref 5.0–8.0)
Protein, ur: NEGATIVE mg/dL
Specific Gravity, Urine: 1.02 (ref 1.005–1.030)
Urobilinogen, UA: 0.2 mg/dL (ref 0.0–1.0)

## 2013-12-26 LAB — LIPASE, BLOOD: LIPASE: 40 U/L (ref 11–59)

## 2013-12-26 MED ORDER — HYDROCODONE-ACETAMINOPHEN 5-325 MG PO TABS: ORAL_TABLET | ORAL | Status: DC

## 2013-12-26 MED ORDER — SODIUM CHLORIDE 0.9 % IV SOLN
INTRAVENOUS | Status: DC
  Administered 2013-12-26: 1 mL via INTRAVENOUS

## 2013-12-26 MED ORDER — OXYCODONE-ACETAMINOPHEN 5-325 MG PO TABS
1.0000 | ORAL_TABLET | Freq: Once | ORAL | Status: AC
  Administered 2013-12-26: 1 via ORAL
  Filled 2013-12-26: qty 1

## 2013-12-26 MED ORDER — PROMETHAZINE HCL 25 MG PO TABS: 25.0000 mg | ORAL_TABLET | Freq: Four times a day (QID) | ORAL | Status: DC | PRN

## 2013-12-26 NOTE — ED Notes (Signed)
Pt stated she has been off antibiotics for 1 week for Uti and she started having pain two days later.  Pt states her pain is in her lower abdomin that radiates to her r and l side and lower back.

## 2013-12-26 NOTE — Discharge Instructions (Signed)
Please follow with your primary care doctor in the next 2 days for a check-up. They must obtain records for further management.   Do not hesitate to return to the Emergency Department for any new, worsening or concerning symptoms.   Take vicodin for breakthrough pain, do not drink alcohol, drive, care for children or do other critical tasks while taking vicodin.  Return to the emergency room for severely worsening abdominal pain, abdominal pain that localizes to a particular area (especially the right lower part of the belly), pain that persists past 8-10 hours, blood in stool or vomit, severe weakness, fainting, or fever.   Maintain hydration by drinking small amounts of clear fluids frequently, then soft diet, and then advance to a solid diet as tolerated. Avoid foods that are spicy, high in fat or dairy.   Abdominal Pain, Women Abdominal (stomach, pelvic, or belly) pain can be caused by many things. It is important to tell your doctor:  The location of the pain.  Does it come and go or is it present all the time?  Are there things that start the pain (eating certain foods, exercise)?  Are there other symptoms associated with the pain (fever, nausea, vomiting, diarrhea)? All of this is helpful to know when trying to find the cause of the pain. CAUSES   Stomach: virus or bacteria infection, or ulcer.  Intestine: appendicitis (inflamed appendix), regional ileitis (Crohn's disease), ulcerative colitis (inflamed colon), irritable bowel syndrome, diverticulitis (inflamed diverticulum of the colon), or cancer of the stomach or intestine.  Gallbladder disease or stones in the gallbladder.  Kidney disease, kidney stones, or infection.  Pancreas infection or cancer.  Fibromyalgia (pain disorder).  Diseases of the female organs:  Uterus: fibroid (non-cancerous) tumors or infection.  Fallopian tubes: infection or tubal pregnancy.  Ovary: cysts or tumors.  Pelvic adhesions (scar  tissue).  Endometriosis (uterus lining tissue growing in the pelvis and on the pelvic organs).  Pelvic congestion syndrome (female organs filling up with blood just before the menstrual period).  Pain with the menstrual period.  Pain with ovulation (producing an egg).  Pain with an IUD (intrauterine device, birth control) in the uterus.  Cancer of the female organs.  Functional pain (pain not caused by a disease, may improve without treatment).  Psychological pain.  Depression. DIAGNOSIS  Your doctor will decide the seriousness of your pain by doing an examination.  Blood tests.  X-rays.  Ultrasound.  CT scan (computed tomography, special type of X-ray).  MRI (magnetic resonance imaging).  Cultures, for infection.  Barium enema (dye inserted in the large intestine, to better view it with X-rays).  Colonoscopy (looking in intestine with a lighted tube).  Laparoscopy (minor surgery, looking in abdomen with a lighted tube).  Major abdominal exploratory surgery (looking in abdomen with a large incision). TREATMENT  The treatment will depend on the cause of the pain.   Many cases can be observed and treated at home.  Over-the-counter medicines recommended by your caregiver.  Prescription medicine.  Antibiotics, for infection.  Birth control pills, for painful periods or for ovulation pain.  Hormone treatment, for endometriosis.  Nerve blocking injections.  Physical therapy.  Antidepressants.  Counseling with a psychologist or psychiatrist.  Minor or major surgery. HOME CARE INSTRUCTIONS   Do not take laxatives, unless directed by your caregiver.  Take over-the-counter pain medicine only if ordered by your caregiver. Do not take aspirin because it can cause an upset stomach or bleeding.  Try a clear liquid  diet (broth or water) as ordered by your caregiver. Slowly move to a bland diet, as tolerated, if the pain is related to the stomach or  intestine.  Have a thermometer and take your temperature several times a day, and record it.  Bed rest and sleep, if it helps the pain.  Avoid sexual intercourse, if it causes pain.  Avoid stressful situations.  Keep your follow-up appointments and tests, as your caregiver orders.  If the pain does not go away with medicine or surgery, you may try:  Acupuncture.  Relaxation exercises (yoga, meditation).  Group therapy.  Counseling. SEEK MEDICAL CARE IF:   You notice certain foods cause stomach pain.  Your home care treatment is not helping your pain.  You need stronger pain medicine.  You want your IUD removed.  You feel faint or lightheaded.  You develop nausea and vomiting.  You develop a rash.  You are having side effects or an allergy to your medicine. SEEK IMMEDIATE MEDICAL CARE IF:   Your pain does not go away or gets worse.  You have a fever.  Your pain is felt only in portions of the abdomen. The right side could possibly be appendicitis. The left lower portion of the abdomen could be colitis or diverticulitis.  You are passing blood in your stools (bright red or black tarry stools, with or without vomiting).  You have blood in your urine.  You develop chills, with or without a fever.  You pass out. MAKE SURE YOU:   Understand these instructions.  Will watch your condition.  Will get help right away if you are not doing well or get worse. Document Released: 01/10/2007 Document Revised: 07/30/2013 Document Reviewed: 01/30/2009 [facility] Patient Information 2015 [place], [place]. This information is not intended to replace advice given to you by your health care provider. Make sure you discuss any questions you have with your health care provider.

## 2013-12-26 NOTE — ED Notes (Signed)
Per pt sts she has been taking abx for blood in urine and back pain. sts she finished the abx and still having back pain. sts today after work she began to have lower abdominal cramping.

## 2013-12-26 NOTE — ED Provider Notes (Signed)
CSN: [id number]     Arrival date & time 12/26/13  1516 History   First MD Initiated Contact with Patient 12/26/13 1950     Chief Complaint  Patient presents with  . Back Pain  . Hematuria     (Consider location/radiation/quality/duration/timing/severity/associated sxs/prior Treatment) HPI  [name] is a 53 y.o. female with past medical history significant for non-insulin-dependent diabetes, hypertension, hyperlipidemia complaining of 2 weeks of lower abdominal pain and cramping and low back pain. Patient states she saw her primary care physician 2 weeks ago, there was blood in her work urine, she was started on 7 days of antibiotic which she was compliant with. Patient denies any dysuria, gross hematuria, foul-smelling or dark colored urine, urinary frequency throughout this period states that she finished the antibiotics about 6 days ago and 4 days ago the lower abdominal pain recurred. States that she has associated low back pain onset today. 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 Our view of systems patient endorses chills, nausea, she denies fever (however she states that she takes Motrin and acetaminophen around-the-clock for arthritis). Patient denies abnormal vaginal discharge, change in bowel movements, change in urination, chest pain, shortness of breath, nausea vomiting, decreased by mouth intake. States that the lower abdominal pain is cramping, she can't identify any exacerbating factors. It was 10 out of 10 at its worst, she was given triage initiated Percocet and the pain is now 4/10.   Past Medical History  Diagnosis Date  . Diabetes mellitus   . Hypertension   . Hypercholesteremia   . Heart murmur    Past Surgical History  Procedure Laterality Date  . Colon surgery    . Abdominal hysterectomy    . Tonsillectomy    . Knee arthroscopy    . Cholecystectomy     History reviewed. No pertinent family history. History   Substance Use Topics  . Smoking status: Never Smoker   . Smokeless tobacco: Not on file  . Alcohol Use: No   OB History   Grav Para Term Preterm Abortions TAB SAB Ect Mult Living                 Review of Systems  10 systems reviewed and found to be negative, except as noted in the HPI.   Allergies  Lorabid; Codeine; Sulfa drugs cross reactors; Benicar; and Other  Home Medications   Prior to Admission medications   Medication Sig Start Date End Date Taking? Authorizing Provider  acetaminophen (TYLENOL) 325 MG tablet Take 975 mg by mouth every 8 (eight) hours as needed for mild pain (pain).    Yes Historical Provider, MD  albuterol (PROVENTIL HFA;VENTOLIN HFA) 108 (90 BASE) MCG/ACT inhaler Inhale 2 puffs into the lungs every 4 (four) hours as needed for wheezing or shortness of breath. 09/07/13  Yes Leeanne Rio, MD  aspirin EC 325 MG tablet Take 325 mg by mouth daily.   Yes Historical Provider, MD  atenolol (TENORMIN) 50 MG tablet Take 50 mg by mouth 2 (two) times daily.   Yes Historical Provider, MD  B Complex Vitamins (VITAMIN B COMPLEX PO) Take 1 tablet by mouth daily.   Yes Historical Provider, MD  furosemide (LASIX) 40 MG tablet Take 80 mg by mouth 2 (two) times daily.    Yes Historical Provider, MD  Glucosamine-Chondroitin (MOVE FREE PO) Take 2 tablets by mouth daily.   Yes Historical Provider, MD  ibuprofen (ADVIL,MOTRIN) 200 MG  tablet Take 800 mg by mouth daily. Take every morning for pain in ankles  per patient   Yes Historical Provider, MD  meloxicam (MOBIC) 15 MG tablet Take 15 mg by mouth daily.   Yes Historical Provider, MD  metFORMIN (GLUCOPHAGE) 1000 MG tablet Take 1,000 mg by mouth 2 (two) times daily with a meal.   Yes Historical Provider, MD  Omega-3 Fatty Acids (FISH OIL PO) Take 1 capsule by mouth daily.   Yes Historical Provider, MD  potassium chloride SA (K-DUR,KLOR-CON) 20 MEQ tablet Take 40 mEq by mouth 2 (two) times daily.    Yes Historical Provider,  MD  pravastatin (PRAVACHOL) 20 MG tablet Take 20 mg by mouth daily.  06/25/13  Yes Historical Provider, MD  Probiotic Product (PROBIOTIC PO) Take 1 tablet by mouth daily.   Yes Historical Provider, MD   BP 142/71  Pulse 73  Temp(Src) 97.9 F (36.6 C) (Oral)  Resp 14  Ht 5\' 6"  (1.676 m)  Wt 242 lb (109.77 kg)  BMI 39.08 kg/m2  SpO2 100% Physical Exam  Nursing note and vitals reviewed. Constitutional: She is oriented to person, place, and time. She appears well-developed and well-nourished. No distress.  Obese, well-appearing  HENT:  Head: Normocephalic.  Mouth/Throat: Oropharynx is clear and moist.  Eyes: Conjunctivae and EOM are normal. Pupils are equal, round, and reactive to light.  Neck: Normal range of motion. Neck supple.  Cardiovascular: Normal rate, regular rhythm and intact distal pulses.   Pulmonary/Chest: Effort normal and breath sounds normal. No stridor. No respiratory distress. She has no wheezes. She has no rales. She exhibits no tenderness.  Abdominal: Soft. Bowel sounds are normal. She exhibits no distension and no mass. There is tenderness. There is no rebound and no guarding.  Mild, diffuse tenderness to palpation with no guarding or rebound.  Murphy sign negative, no tenderness to palpation over McBurney's point, Rovsings, Psoas and obturator all negative.   Genitourinary:  No CVA tenderness palpation bilaterally  Pelvic exam a chaperoned by nurse:  No rashes or lesions, no abnormal vaginal discharge, no cervical motion or adnexal tenderness.  Musculoskeletal: Normal range of motion. She exhibits no edema and no tenderness.  Wearing braces on her ankles bilaterally which she states she wears for support.  Neurological: She is alert and oriented to person, place, and time.  Psychiatric: She has a normal mood and affect.    ED Course  Procedures (including critical care time) Labs Review Labs Reviewed  URINALYSIS, ROUTINE W REFLEX MICROSCOPIC - Abnormal;  Notable for the following:    Color, Urine AMBER (*)    All other components within normal limits    Imaging Review No results found.   EKG Interpretation None      MDM   Final diagnoses:  None    Filed Vitals:   12/26/13 2115 12/26/13 2130 12/26/13 2145 12/26/13 2200  BP: 125/72 132/73 121/66 139/76  Pulse: 56 57 57 75  Temp:      TempSrc:      Resp: 17 17 15 20   Height:      Weight:      SpO2: 100% 100% 100% 99%    Medications  0.9 %  sodium chloride infusion ( Intravenous Stopped 12/26/13 2235)  oxyCODONE-acetaminophen (PERCOCET/ROXICET) 5-325 MG per tablet 1 tablet (1 tablet Oral Given 12/26/13 1829)    [name] is a 53 y.o. female presenting with with lower abdominal pain and low back pain onset this afternoon. The abdominal pain  going on for several weeks. There are no systemic signs of infection. Serial abdominal exams are benign. Urinalysis is not consistent with infection. Blood work, pelvic exam, wet prep unremarkable. Patient reports improvement with Percocet. Advised her to follow closely with her primary care physician and precautions are discussed.  Evaluation does not show pathology that would require ongoing emergent intervention or inpatient treatment. Pt is hemodynamically stable and mentating appropriately. Discussed findings and plan with patient/guardian, who agrees with care plan. All questions answered. Return precautions discussed and outpatient follow up given.   Discharge Medication List as of 12/26/2013 10:31 PM    START taking these medications   Details  HYDROcodone-acetaminophen (NORCO/VICODIN) 5-325 MG per tablet Take 1-2 tablets by mouth every 6 hours as needed for pain., Print    promethazine (PHENERGAN) 25 MG tablet Take 1 tablet (25 mg total) by mouth every 6 (six) hours as needed for nausea or vomiting., Starting 12/26/2013, Until Discontinued, [employer], PA-C 12/27/13 0214

## 2013-12-27 LAB — GC/CHLAMYDIA PROBE AMP
CT Probe RNA: NEGATIVE
GC PROBE AMP APTIMA: NEGATIVE

## 2013-12-27 NOTE — ED Provider Notes (Signed)
Medical screening examination/treatment/procedure(s) were performed by non-physician practitioner and as supervising physician I was immediately available for consultation/collaboration.   EKG Interpretation None        Merryl Hacker, MD 12/27/13 [phone]

## 2014-04-23 ENCOUNTER — Observation Stay (HOSPITAL_COMMUNITY)
Admission: EM | Admit: 2014-04-23 | Discharge: 2014-04-25 | Disposition: A | Discharge status: Home / Self Care | Payer: BC Managed Care – PPO | Attending: Internal Medicine | Admitting: Internal Medicine

## 2014-04-23 ENCOUNTER — Encounter (HOSPITAL_COMMUNITY): Payer: Self-pay | Admitting: *Deleted

## 2014-04-23 ENCOUNTER — Emergency Department (HOSPITAL_COMMUNITY): Payer: BC Managed Care – PPO

## 2014-04-23 DIAGNOSIS — E785 Hyperlipidemia, unspecified: Secondary | ICD-10-CM | POA: Insufficient documentation

## 2014-04-23 DIAGNOSIS — E119 Type 2 diabetes mellitus without complications: Secondary | ICD-10-CM | POA: Diagnosis not present

## 2014-04-23 DIAGNOSIS — Z23 Encounter for immunization: Secondary | ICD-10-CM | POA: Insufficient documentation

## 2014-04-23 DIAGNOSIS — E876 Hypokalemia: Secondary | ICD-10-CM | POA: Insufficient documentation

## 2014-04-23 DIAGNOSIS — R079 Chest pain, unspecified: Secondary | ICD-10-CM | POA: Diagnosis present

## 2014-04-23 DIAGNOSIS — I712 Thoracic aortic aneurysm, without rupture, unspecified: Secondary | ICD-10-CM | POA: Insufficient documentation

## 2014-04-23 DIAGNOSIS — M1990 Unspecified osteoarthritis, unspecified site: Secondary | ICD-10-CM

## 2014-04-23 DIAGNOSIS — E7800 Pure hypercholesterolemia, unspecified: Secondary | ICD-10-CM | POA: Diagnosis present

## 2014-04-23 DIAGNOSIS — I209 Angina pectoris, unspecified: Secondary | ICD-10-CM | POA: Diagnosis present

## 2014-04-23 DIAGNOSIS — Z8249 Family history of ischemic heart disease and other diseases of the circulatory system: Secondary | ICD-10-CM | POA: Insufficient documentation

## 2014-04-23 DIAGNOSIS — Z90710 Acquired absence of both cervix and uterus: Secondary | ICD-10-CM | POA: Insufficient documentation

## 2014-04-23 DIAGNOSIS — I10 Essential (primary) hypertension: Secondary | ICD-10-CM | POA: Diagnosis present

## 2014-04-23 DIAGNOSIS — I77819 Aortic ectasia, unspecified site: Secondary | ICD-10-CM | POA: Insufficient documentation

## 2014-04-23 DIAGNOSIS — Z7982 Long term (current) use of aspirin: Secondary | ICD-10-CM | POA: Diagnosis not present

## 2014-04-23 DIAGNOSIS — I25110 Atherosclerotic heart disease of native coronary artery with unstable angina pectoris: Principal | ICD-10-CM | POA: Insufficient documentation

## 2014-04-23 DIAGNOSIS — Z6839 Body mass index (BMI) 39.0-39.9, adult: Secondary | ICD-10-CM | POA: Diagnosis not present

## 2014-04-23 DIAGNOSIS — R0789 Other chest pain: Secondary | ICD-10-CM | POA: Diagnosis not present

## 2014-04-23 DIAGNOSIS — E669 Obesity, unspecified: Secondary | ICD-10-CM | POA: Insufficient documentation

## 2014-04-23 HISTORY — DX: Adverse effect of unspecified anesthetic, initial encounter: T41.45XA

## 2014-04-23 HISTORY — DX: Family history of other specified conditions: Z84.89

## 2014-04-23 HISTORY — DX: Unspecified osteoarthritis, unspecified site: M19.90

## 2014-04-23 HISTORY — DX: Other complications of anesthesia, initial encounter: T88.59XA

## 2014-04-23 HISTORY — DX: Nausea with vomiting, unspecified: R11.2

## 2014-04-23 HISTORY — DX: Other specified postprocedural states: Z98.890

## 2014-04-23 LAB — CBC
HEMATOCRIT: 39.5 % (ref 36.0–46.0)
Hemoglobin: 12.9 g/dL (ref 12.0–15.0)
MCH: 29.7 pg (ref 26.0–34.0)
MCHC: 32.7 g/dL (ref 30.0–36.0)
MCV: 90.8 fL (ref 78.0–100.0)
Platelets: 268 10*3/uL (ref 150–400)
RBC: 4.35 MIL/uL (ref 3.87–5.11)
RDW: 13.1 % (ref 11.5–15.5)
WBC: 7.1 10*3/uL (ref 4.0–10.5)

## 2014-04-23 LAB — I-STAT TROPONIN, ED: Troponin i, poc: 0 ng/mL (ref 0.00–0.08)

## 2014-04-23 LAB — BRAIN NATRIURETIC PEPTIDE: B Natriuretic Peptide: 139.2 pg/mL — ABNORMAL HIGH (ref 0.0–100.0)

## 2014-04-23 LAB — BASIC METABOLIC PANEL
Anion gap: 11 (ref 5–15)
BUN: 15 mg/dL (ref 6–23)
CHLORIDE: 104 mmol/L (ref 96–112)
CO2: 27 mmol/L (ref 19–32)
CREATININE: 0.83 mg/dL (ref 0.50–1.10)
Calcium: 9.2 mg/dL (ref 8.4–10.5)
GFR calc Af Amer: >90 mL/min (ref >90)
GFR, EST NON AFRICAN AMERICAN: 80 mL/min — AB (ref >90)
GLUCOSE: 209 mg/dL — AB (ref 70–99)
Potassium: 4 mmol/L (ref 3.5–5.1)
SODIUM: 142 mmol/L (ref 135–145)

## 2014-04-23 LAB — GLUCOSE, CAPILLARY: Glucose-Capillary: 184 mg/dL — ABNORMAL HIGH (ref 70–99)

## 2014-04-23 LAB — TROPONIN I: Troponin I: <0.03 ng/mL (ref <0.031)

## 2014-04-23 MED ORDER — ENOXAPARIN SODIUM 40 MG/0.4ML ~~LOC~~ SOLN
40.0000 mg | SUBCUTANEOUS | Status: DC
  Administered 2014-04-24: 40 mg via SUBCUTANEOUS
  Filled 2014-04-23 (×2): qty 0.4

## 2014-04-23 MED ORDER — PRAVASTATIN SODIUM 20 MG PO TABS
20.0000 mg | ORAL_TABLET | Freq: Every day | ORAL | Status: DC
  Administered 2014-04-23 – 2014-04-25 (×3): 20 mg via ORAL
  Filled 2014-04-23 (×3): qty 1

## 2014-04-23 MED ORDER — ALBUTEROL SULFATE (2.5 MG/3ML) 0.083% IN NEBU: 2.5000 mg | INHALATION_SOLUTION | RESPIRATORY_TRACT | Status: DC | PRN

## 2014-04-23 MED ORDER — ACETAMINOPHEN 325 MG PO TABS
650.0000 mg | ORAL_TABLET | ORAL | Status: DC | PRN
  Administered 2014-04-24: 650 mg via ORAL
  Filled 2014-04-23: qty 2

## 2014-04-23 MED ORDER — MORPHINE SULFATE 2 MG/ML IJ SOLN
2.0000 mg | INTRAMUSCULAR | Status: DC | PRN
Start: 2014-04-23 — End: 2014-04-25

## 2014-04-23 MED ORDER — HYDROCODONE-ACETAMINOPHEN 5-325 MG PO TABS
1.0000 | ORAL_TABLET | Freq: Four times a day (QID) | ORAL | Status: DC | PRN
  Administered 2014-04-24: 1 via ORAL
  Filled 2014-04-23: qty 1

## 2014-04-23 MED ORDER — ATENOLOL 50 MG PO TABS
50.0000 mg | ORAL_TABLET | Freq: Two times a day (BID) | ORAL | Status: DC
  Administered 2014-04-23 – 2014-04-25 (×4): 50 mg via ORAL
  Filled 2014-04-23 (×5): qty 1

## 2014-04-23 MED ORDER — ASPIRIN 81 MG PO CHEW
324.0000 mg | CHEWABLE_TABLET | Freq: Once | ORAL | Status: AC
  Administered 2014-04-23: 324 mg via ORAL
  Filled 2014-04-23: qty 4

## 2014-04-23 MED ORDER — FUROSEMIDE 80 MG PO TABS
80.0000 mg | ORAL_TABLET | Freq: Two times a day (BID) | ORAL | Status: DC
  Administered 2014-04-23 – 2014-04-24 (×2): 80 mg via ORAL
  Filled 2014-04-23 (×4): qty 1

## 2014-04-23 MED ORDER — ONDANSETRON HCL 4 MG/2ML IJ SOLN: 4.0000 mg | Freq: Four times a day (QID) | INTRAMUSCULAR | Status: DC | PRN

## 2014-04-23 MED ORDER — NITROGLYCERIN 0.4 MG SL SUBL
0.4000 mg | SUBLINGUAL_TABLET | SUBLINGUAL | Status: AC | PRN
  Administered 2014-04-23 (×3): 0.4 mg via SUBLINGUAL
  Filled 2014-04-23: qty 1

## 2014-04-23 MED ORDER — INSULIN ASPART 100 UNIT/ML ~~LOC~~ SOLN
0.0000 [IU] | Freq: Three times a day (TID) | SUBCUTANEOUS | Status: DC
  Administered 2014-04-24 – 2014-04-25 (×2): 3 [IU] via SUBCUTANEOUS
  Administered 2014-04-25: 2 [IU] via SUBCUTANEOUS

## 2014-04-23 MED ORDER — ASPIRIN EC 325 MG PO TBEC
325.0000 mg | DELAYED_RELEASE_TABLET | Freq: Every day | ORAL | Status: DC
  Administered 2014-04-24 – 2014-04-25 (×2): 325 mg via ORAL
  Filled 2014-04-23 (×3): qty 1

## 2014-04-23 MED ORDER — INSULIN ASPART 100 UNIT/ML ~~LOC~~ SOLN: 0.0000 [IU] | Freq: Every day | SUBCUTANEOUS | Status: DC

## 2014-04-23 NOTE — H&P (Signed)
[facility] History and Physical  Patient: [name]  MRN: [id number]  DOB: [birth year]  DOS: the patient was seen and examined on 04/23/2014 PCP: Kristine Garbe, MD  Chief Complaint:   HPI: SANIAH SCHROETER is a 54 y.o. female with Past medical history of hypertension, diabetes mellitus, heart murmur, dyslipidemia. The patient is presenting with complaints of chest pain. Her symptoms have been ongoing for the last 2 weeks and having progressively worsening. She mentions that the pain occurs substernally and it feels like tightness and primarily occurs on exertion of more than her daily activity. The symptom resolves when she rests. She started having the symptoms on Wednesday and saw her PCP and was scheduled to see cardiology as an outpatient. But she continues to have the symptoms and then was referred to ER for further workup. At the time of my evaluation the patient is pain-free. She also complains of nausea and dry heaving with this symptoms of chest tightness. She denies any leg swelling orthopnea PND denies any recent travel or immobilization denies any leg tenderness. She denies any recent change in her medication. She is compliant with all her medications. She had a stress test many years ago but no invasive cardiac workup. Family history of heart failure, A. fib and CVA in father but no significant history of myocardial infarction. Patient mentions she has significant joint pains and arthritis and has been using ibuprofen twice a day as well as meloxicam.   The patient is coming from home. And at her baseline independent for most of her ADL.  Review of Systems: as mentioned in the history of present illness.  A Comprehensive review of the other systems is negative.  Past Medical History  Diagnosis Date  . Diabetes mellitus   . Hypertension   . Hypercholesteremia   . Heart murmur    Past Surgical History  Procedure Laterality Date  . Colon surgery    . Abdominal  hysterectomy    . Tonsillectomy    . Knee arthroscopy    . Cholecystectomy     Social History:  reports that she has never smoked. She does not have any smokeless tobacco history on file. She reports that she does not drink alcohol or use illicit drugs.  Allergies  Allergen Reactions  . Lorabid [Loracarbef] Anaphylaxis  . Codeine Nausea And Vomiting  . Sulfa Drugs Cross Reactors Other (See Comments)    Severe allergic reaction as a child - was not told symptoms  . Benicar [Olmesartan Medoxomil] Swelling and Rash  . Other Rash    Green peas    Family History  Problem Relation Age of Onset  . Heart failure Mother   . Stroke Father   . Heart failure Father   . Diabetes Mother   . Cancer Father     Prior to Admission medications   Medication Sig Start Date End Date Taking? Authorizing Provider  acetaminophen (TYLENOL) 325 MG tablet Take 975 mg by mouth every 8 (eight) hours as needed for mild pain (pain).    Yes Historical Provider, MD  albuterol (PROVENTIL HFA;VENTOLIN HFA) 108 (90 BASE) MCG/ACT inhaler Inhale 2 puffs into the lungs every 4 (four) hours as needed for wheezing or shortness of breath. 09/07/13  Yes Leeanne Rio, MD  aspirin EC 325 MG tablet Take 325 mg by mouth daily.   Yes Historical Provider, MD  atenolol (TENORMIN) 50 MG tablet Take 50 mg by mouth 2 (two) times daily.   Yes Historical Provider,  MD  B Complex Vitamins (VITAMIN B COMPLEX PO) Take 1 tablet by mouth daily.   Yes Historical Provider, MD  furosemide (LASIX) 40 MG tablet Take 80 mg by mouth 2 (two) times daily.    Yes Historical Provider, MD  Glucosamine-Chondroitin (MOVE FREE PO) Take 2 tablets by mouth daily.   Yes Historical Provider, MD  ibuprofen (ADVIL,MOTRIN) 200 MG tablet Take 800 mg by mouth daily. Take every morning for pain in ankles  per patient   Yes Historical Provider, MD  meloxicam (MOBIC) 15 MG tablet Take 15 mg by mouth daily.   Yes Historical Provider, MD  metFORMIN (GLUCOPHAGE)  1000 MG tablet Take 1,000 mg by mouth 2 (two) times daily with a meal.   Yes Historical Provider, MD  Omega-3 Fatty Acids (FISH OIL PO) Take 1 capsule by mouth daily.   Yes Historical Provider, MD  potassium chloride SA (K-DUR,KLOR-CON) 20 MEQ tablet Take 40 mEq by mouth 2 (two) times daily.    Yes Historical Provider, MD  pravastatin (PRAVACHOL) 20 MG tablet Take 20 mg by mouth daily.  06/25/13  Yes Historical Provider, MD  Probiotic Product (PROBIOTIC PO) Take 1 tablet by mouth daily.   Yes Historical Provider, MD  HYDROcodone-acetaminophen (NORCO/VICODIN) 5-325 MG per tablet Take 1-2 tablets by mouth every 6 hours as needed for pain. Patient not taking: Reported on 04/23/2014 12/26/13   Elmyra Ricks Pisciotta, PA-C  promethazine (PHENERGAN) 25 MG tablet Take 1 tablet (25 mg total) by mouth every 6 (six) hours as needed for nausea or vomiting. Patient not taking: Reported on 04/23/2014 12/26/13   Monico Blitz, PA-C    Physical Exam: Filed Vitals:   04/23/14 1900 04/23/14 1905 04/23/14 2030 04/23/14 2057  BP: 107/58 107/58 121/64 140/71  Pulse: 59 63 62 60  Temp:    98 F (36.7 C)  TempSrc:    Oral  Resp: 10 16 12 15   SpO2:  97%  100%    General: Alert, Awake and Oriented to Time, Place and Person. Appear in mild distress Eyes: PERRL ENT: Oral Mucosa clear moist Neck: no JVD Cardiovascular: S1 and S2 Present,  aortic systolic Murmur, Peripheral Pulses Present Respiratory: Bilateral Air entry equal and Decreased, Clear to Auscultation, noCrackles, no wheezes Abdomen: Bowel Sound present, Soft and non tender Skin: no Rash Extremities: no Pedal edema, no calf tenderness Neurologic: Grossly no focal neuro deficit.  Labs on Admission:  CBC:  Recent Labs Lab 04/23/14 1308  WBC 7.1  HGB 12.9  HCT 39.5  MCV 90.8  PLT 268    CMP     Component Value Date/Time   NA 142 04/23/2014 1308   K 4.0 04/23/2014 1308   CL 104 04/23/2014 1308   CO2 27 04/23/2014 1308   GLUCOSE 209*  04/23/2014 1308   BUN 15 04/23/2014 1308   CREATININE 0.83 04/23/2014 1308   CALCIUM 9.2 04/23/2014 1308   PROT 7.6 12/26/2013 2100   ALBUMIN 3.7 12/26/2013 2100   AST 25 12/26/2013 2100   ALT 19 12/26/2013 2100   ALKPHOS 79 12/26/2013 2100   BILITOT 0.4 12/26/2013 2100   GFRNONAA 80* 04/23/2014 1308   GFRAA >90 04/23/2014 1308    No results for input(s): LIPASE, AMYLASE in the last 168 hours.  No results for input(s): CKTOTAL, CKMB, CKMBINDEX, TROPONINI in the last 168 hours. BNP (last 3 results) No results for input(s): PROBNP in the last 8760 hours.  Radiological Exams on Admission: Dg Chest 2 View  04/23/2014   CLINICAL  DATA:  Mid chest pain, shortness of breath for 2 weeks  EXAM: CHEST  2 VIEW  COMPARISON:  09/07/2013  FINDINGS: The heart size and mediastinal contours are within normal limits. Both lungs are clear. The visualized skeletal structures are unremarkable.  IMPRESSION: No active cardiopulmonary disease.   Electronically Signed   By: Kathreen Devoid   On: 04/23/2014 14:09    EKG: Independently reviewed. normal sinus rhythm, nonspecific ST and T waves changes.  Assessment/Plan Principal Problem:   Chest pain Active Problems:   Type 2 diabetes mellitus   Hypertension   Hypercholesteremia   Anginal pain   1. Chest pain  The patient presents with chest tightness, nausea, shortness of breath all occurring on exertion and resolving on rest. her initial EKG and troponin Does not show any signs of acute ischemia. her chest x-ray is showing no active cardiopulmonary disease At present we will admit the patient to the hospital for observation. I will obtain serial troponin every 6 hours, monitor on telemetry, get an echocardiogram in the morning to rule out ACS.  Continue with aspirin, beta blocker, statin, sublingual nitroglycerin.  2. Diabetes mellitus. Holding metformin and placing the patient on sliding scale. Check hemoglobin A1c in morning.  3.  Dyslipidemia. Continue Pravachol.  4. Arthritis. At present holding NSAIDs and placing the patient on Vicodin.  Advance goals of care discussion: Full code   DVT Prophylaxis: subcutaneous Heparin Nutrition: Cardiac and diabetic diet, nothing by mouth after midnight Family Communication: family was present at bedside, opportunity was given to ask question and all questions were answered satisfactorily at the time of interview. Disposition: Admitted to observation in telemetry unit.  Author: Berle Mull, MD [facility] Pager: [phone] 04/23/2014, 9:05 PM    If 7PM-7AM, please contact night-coverage [email or web address] Password [email or web address]

## 2014-04-23 NOTE — ED Provider Notes (Signed)
CSN: [id number]     Arrival date & time 04/23/14  1237 History   First MD Initiated Contact with Patient 04/23/14 1653     Chief Complaint  Patient presents with  . Chest Pain   (Consider location/radiation/quality/duration/timing/severity/associated sxs/prior Treatment) HPI  [name] is a 54 yo female presenting with chest pain and shortness of breath.  She states she first noticed the symptoms 2 weeks ago.  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  The pain progressively got worse later in the afternoon as she was lifting boxes. She had to stop in between lifting to rest so she could continue.  She was seen by her PCP a few days later and an EKG and blood work was done and plan to establish a visit with cardiology.  She was told if her symptoms returned to come to the ED.  She states she had a recurrence of the chest pain yesterday when walking around [employer].  Her chest felt tight, she became short of breath and nauseated.  The symptoms subsided after she sat down.  She had another episode today after just walking from the bed to the bathroom.  She reports feeling fine while lying still, but recurrent chest tightness, shortness of breath, and nausea with exertion.  She endorses a mild cough for a few days. She denies recent illness, diaphoresis, fevers, chills, or diarrhea.   Past Medical History  Diagnosis Date  . Diabetes mellitus   . Hypertension   . Hypercholesteremia   . Heart murmur    Past Surgical History  Procedure Laterality Date  . Colon surgery    . Abdominal hysterectomy    . Tonsillectomy    . Knee arthroscopy    . Cholecystectomy     History reviewed. No pertinent family history. History  Substance Use Topics  . Smoking status: Never Smoker   . Smokeless tobacco: Not on file  . Alcohol Use: No   OB History    No data available     Review of Systems  Constitutional:  Negative for fever and chills.  HENT: Negative for sore throat.   Eyes: Negative for visual disturbance.  Respiratory: Positive for shortness of breath. Negative for cough.   Cardiovascular: Positive for chest pain. Negative for leg swelling.  Gastrointestinal: Positive for nausea and vomiting. Negative for diarrhea.  Genitourinary: Negative for dysuria.  Musculoskeletal: Negative for myalgias.  Skin: Negative for rash.  Neurological: Negative for weakness, numbness and headaches.      Allergies  Lorabid; Codeine; Sulfa drugs cross reactors; Benicar; and Other  Home Medications   Prior to Admission medications   Medication Sig Start Date End Date Taking? Authorizing Provider  acetaminophen (TYLENOL) 325 MG tablet Take 975 mg by mouth every 8 (eight) hours as needed for mild pain (pain).     Historical Provider, MD  albuterol (PROVENTIL HFA;VENTOLIN HFA) 108 (90 BASE) MCG/ACT inhaler Inhale 2 puffs into the lungs every 4 (four) hours as needed for wheezing or shortness of breath. 09/07/13   Leeanne Rio, MD  aspirin EC 325 MG tablet Take 325 mg by mouth daily.    Historical Provider, MD  atenolol (TENORMIN) 50 MG tablet Take 50 mg by mouth 2 (two) times daily.    Historical Provider, MD  B Complex Vitamins (VITAMIN B COMPLEX PO) Take 1 tablet by mouth daily.    Historical Provider,  MD  furosemide (LASIX) 40 MG tablet Take 80 mg by mouth 2 (two) times daily.     Historical Provider, MD  Glucosamine-Chondroitin (MOVE FREE PO) Take 2 tablets by mouth daily.    Historical Provider, MD  HYDROcodone-acetaminophen (NORCO/VICODIN) 5-325 MG per tablet Take 1-2 tablets by mouth every 6 hours as needed for pain. 12/26/13   Nicole Pisciotta, PA-C  ibuprofen (ADVIL,MOTRIN) 200 MG tablet Take 800 mg by mouth daily. Take every morning for pain in ankles  per patient    Historical Provider, MD  meloxicam (MOBIC) 15 MG tablet Take 15 mg by mouth daily.    Historical Provider, MD  metFORMIN  (GLUCOPHAGE) 1000 MG tablet Take 1,000 mg by mouth 2 (two) times daily with a meal.    Historical Provider, MD  Omega-3 Fatty Acids (FISH OIL PO) Take 1 capsule by mouth daily.    Historical Provider, MD  potassium chloride SA (K-DUR,KLOR-CON) 20 MEQ tablet Take 40 mEq by mouth 2 (two) times daily.     Historical Provider, MD  pravastatin (PRAVACHOL) 20 MG tablet Take 20 mg by mouth daily.  06/25/13   Historical Provider, MD  Probiotic Product (PROBIOTIC PO) Take 1 tablet by mouth daily.    Historical Provider, MD  promethazine (PHENERGAN) 25 MG tablet Take 1 tablet (25 mg total) by mouth every 6 (six) hours as needed for nausea or vomiting. 12/26/13   Elmyra Ricks Pisciotta, PA-C   BP 130/67 mmHg  Pulse 64  Temp(Src) 98.2 F (36.8 C) (Oral)  Resp 18  SpO2 98% Physical Exam  Constitutional: She appears well-developed and well-nourished. No distress.  HENT:  Head: Normocephalic and atraumatic.  Eyes: Conjunctivae are normal.  Neck: Neck supple. No thyromegaly present.  Cardiovascular: Normal rate, regular rhythm, S1 normal, S2 normal and intact distal pulses.  Exam reveals no gallop, no S3, no S4 and no friction rub.   No murmur heard. Pulmonary/Chest: Effort normal and breath sounds normal. No respiratory distress. She has no decreased breath sounds. She has no wheezes. She has no rhonchi. She has no rales. She exhibits no tenderness.  Abdominal: Soft. There is no tenderness.  Musculoskeletal: She exhibits no tenderness.  Lymphadenopathy:    She has no cervical adenopathy.  Neurological: She is alert.  Skin: Skin is warm and dry. No rash noted. She is not diaphoretic.  Psychiatric: She has a normal mood and affect.  Nursing note and vitals reviewed.   ED Course  Procedures (including critical care time) Labs Review Labs Reviewed  BASIC METABOLIC PANEL - Abnormal; Notable for the following:    Glucose, Bld 209 (*)    GFR calc non Af Amer 80 (*)    All other components within normal  limits  BRAIN NATRIURETIC PEPTIDE - Abnormal; Notable for the following:    B Natriuretic Peptide 139.2 (*)    All other components within normal limits  CBC  I-STAT TROPOININ, ED    Imaging Review Dg Chest 2 View  04/23/2014   CLINICAL DATA:  Mid chest pain, shortness of breath for 2 weeks  EXAM: CHEST  2 VIEW  COMPARISON:  09/07/2013  FINDINGS: The heart size and mediastinal contours are within normal limits. Both lungs are clear. The visualized skeletal structures are unremarkable.  IMPRESSION: No active cardiopulmonary disease.   Electronically Signed   By: Kathreen Devoid   On: 04/23/2014 14:09     EKG Interpretation   Date/Time:  Tuesday April 23 2014 12:42:25 EST Ventricular Rate:  66 PR  Interval:  176 QRS Duration: 84 QT Interval:  380 QTC Calculation: 398 R Axis:   -70 Text Interpretation:  Normal sinus rhythm Left axis deviation No  significant change since last tracing Confirmed by HARRISON  MD, FORREST  ([id number]) on 04/23/2014 5:26:16 PM      MDM   Final diagnoses:  Anginal pain   54 yo with episodic chest pain, shortness of breath and nausea worse with exertion and therefore concern for cardiac etiology of chest pain. Discussed case with Dr. Aline Brochure.  CBC, BMP, Troponin, BNP, EKG and CXR done from protocol orders reviewed.  No significant abnormality except mildly elevated BNP: 139.2. Aspirin and SL nitro given.  Pt's heart score is 4  Consulted Dr. Posey Pronto (hospitalist) regarding pt's case.  She is accepted to their service for tele/observation. Pt updated with the plan. The patient appears reasonably stabilized for admission considering the current resources, flow, and capabilities available in the ED at this time, and I doubt any further screening and/or treatment in the ED prior to admission.   Filed Vitals:   04/23/14 1845 04/23/14 1900 04/23/14 1905 04/23/14 2030  BP: 116/69 107/58 107/58 121/64  Pulse: 62 59 63 62  Temp:      TempSrc:      Resp: 12 10 16  12   SpO2:   97%    Meds given in ED:  Medications  aspirin chewable tablet 324 mg (324 mg Oral Given 04/23/14 1829)  nitroGLYCERIN (NITROSTAT) SL tablet 0.4 mg (0.4 mg Sublingual Given 04/23/14 1858)    Current Discharge Medication List         Britt Bottom, NP 04/25/14 2035  Pamella Pert, MD 04/25/14 1208

## 2014-04-23 NOTE — ED Notes (Signed)
Pt reports having mid chest tightness, cough and sob x 2 weeks. ekg done at triage and airway intact.

## 2014-04-24 ENCOUNTER — Encounter (HOSPITAL_COMMUNITY)
Admission: EM | Disposition: A | Discharge status: Home / Self Care | Payer: Self-pay | Source: Home / Self Care | Attending: Emergency Medicine

## 2014-04-24 ENCOUNTER — Encounter (HOSPITAL_COMMUNITY): Payer: Self-pay | Admitting: General Practice

## 2014-04-24 DIAGNOSIS — I5032 Chronic diastolic (congestive) heart failure: Secondary | ICD-10-CM

## 2014-04-24 DIAGNOSIS — I2510 Atherosclerotic heart disease of native coronary artery without angina pectoris: Secondary | ICD-10-CM

## 2014-04-24 DIAGNOSIS — I200 Unstable angina: Secondary | ICD-10-CM

## 2014-04-24 HISTORY — PX: LEFT HEART CATHETERIZATION WITH CORONARY ANGIOGRAM: SHX5451

## 2014-04-24 LAB — HEMOGLOBIN A1C
Hgb A1c MFr Bld: 7.4 % — ABNORMAL HIGH (ref <5.7)
Mean Plasma Glucose: 166 mg/dL — ABNORMAL HIGH (ref <117)

## 2014-04-24 LAB — PROTIME-INR
INR: 1.03 (ref 0.00–1.49)
PROTHROMBIN TIME: 13.6 s (ref 11.6–15.2)

## 2014-04-24 LAB — POTASSIUM: Potassium: 3.5 mmol/L (ref 3.5–5.1)

## 2014-04-24 LAB — TROPONIN I
Troponin I: <0.03 ng/mL (ref <0.031)
Troponin I: <0.03 ng/mL (ref <0.031)

## 2014-04-24 LAB — GLUCOSE, CAPILLARY
Glucose-Capillary: 160 mg/dL — ABNORMAL HIGH (ref 70–99)
Glucose-Capillary: 149 mg/dL — ABNORMAL HIGH (ref 70–99)
Glucose-Capillary: 149 mg/dL — ABNORMAL HIGH (ref 70–99)
Glucose-Capillary: 172 mg/dL — ABNORMAL HIGH (ref 70–99)

## 2014-04-24 SURGERY — LEFT HEART CATHETERIZATION WITH CORONARY ANGIOGRAM

## 2014-04-24 MED ORDER — INFLUENZA VAC SPLIT QUAD 0.5 ML IM SUSY
0.5000 mL | PREFILLED_SYRINGE | INTRAMUSCULAR | Status: DC
  Filled 2014-04-24: qty 0.5

## 2014-04-24 MED ORDER — LIDOCAINE HCL (PF) 1 % IJ SOLN
INTRAMUSCULAR | Status: AC
  Filled 2014-04-24: qty 30

## 2014-04-24 MED ORDER — SODIUM CHLORIDE 0.9 % IV SOLN
1.0000 mL/kg/h | INTRAVENOUS | Status: DC
Start: 2014-04-24 — End: 2014-04-24
  Administered 2014-04-24: 1 mL/kg/h via INTRAVENOUS

## 2014-04-24 MED ORDER — PNEUMOCOCCAL VAC POLYVALENT 25 MCG/0.5ML IJ INJ
0.5000 mL | INJECTION | INTRAMUSCULAR | Status: DC
Start: 2014-04-25 — End: 2014-04-25
  Filled 2014-04-24: qty 0.5

## 2014-04-24 MED ORDER — SODIUM CHLORIDE 0.9 % IV SOLN
INTRAVENOUS | Status: AC
Start: 2014-04-24 — End: 2014-04-24
  Administered 2014-04-24: 17:00:00 via INTRAVENOUS

## 2014-04-24 MED ORDER — SODIUM CHLORIDE 0.9 % IJ SOLN: 3.0000 mL | INTRAMUSCULAR | Status: DC | PRN

## 2014-04-24 MED ORDER — VERAPAMIL HCL 2.5 MG/ML IV SOLN
INTRAVENOUS | Status: AC
  Filled 2014-04-24: qty 2

## 2014-04-24 MED ORDER — MIDAZOLAM HCL 2 MG/2ML IJ SOLN
INTRAMUSCULAR | Status: AC
  Filled 2014-04-24: qty 2

## 2014-04-24 MED ORDER — HEPARIN (PORCINE) IN NACL 2-0.9 UNIT/ML-% IJ SOLN
INTRAMUSCULAR | Status: AC
  Filled 2014-04-24: qty 1000

## 2014-04-24 MED ORDER — SODIUM CHLORIDE 0.9 % IV SOLN: 250.0000 mL | INTRAVENOUS | Status: DC | PRN

## 2014-04-24 MED ORDER — NITROGLYCERIN 1 MG/10 ML FOR IR/CATH LAB
INTRA_ARTERIAL | Status: AC
  Filled 2014-04-24: qty 10

## 2014-04-24 MED ORDER — SODIUM CHLORIDE 0.9 % IJ SOLN: 3.0000 mL | Freq: Two times a day (BID) | INTRAMUSCULAR | Status: DC

## 2014-04-24 MED ORDER — FENTANYL CITRATE 0.05 MG/ML IJ SOLN
INTRAMUSCULAR | Status: AC
  Filled 2014-04-24: qty 2

## 2014-04-24 MED ORDER — ASPIRIN 81 MG PO CHEW: 81.0000 mg | CHEWABLE_TABLET | ORAL | Status: DC

## 2014-04-24 NOTE — Interval H&P Note (Signed)
History and Physical Interval Note:  04/24/2014 3:47 PM  [name]  has presented today for cardiac cath with the diagnosis of unstable angina.   The various methods of treatment have been discussed with the patient and family. After consideration of risks, benefits and other options for treatment, the patient has consented to  Procedure(s): LEFT HEART CATHETERIZATION WITH CORONARY ANGIOGRAM (N/A) as a surgical intervention .  The patient's history has been reviewed, patient examined, no change in status, stable for surgery.  I have reviewed the patient's chart and labs.  Questions were answered to the patient's satisfaction.    Cath Lab Visit (complete for each Cath Lab visit)  Clinical Evaluation Leading to the Procedure:   ACS: No.  Non-ACS:    Anginal Classification: CCS III  Anti-ischemic medical therapy: Minimal Therapy (1 class of medications)  Non-Invasive Test Results: No non-invasive testing performed  Prior CABG: No previous CABG         MCALHANY,CHRISTOPHER

## 2014-04-24 NOTE — CV Procedure (Addendum)
      Cardiac Catheterization Operative Report  [name] [id number] 1/27/20164:19 PM Kristine Garbe, MD  Procedure Performed:  1. Left Heart Catheterization 2. Selective Coronary Angiography 3. Left ventricular angiogram  Operator: Lauree Chandler, MD  Arterial access site:  Right radial artery.   Indication:  54 yo female with history of DM, HTN, HLD, obesity admitted with chest pain. Troponin negative. Chest pain with exertion worrisome for unstable angina.                                     Procedure Details: The risks, benefits, complications, treatment options, and expected outcomes were discussed with the patient. The patient and/or family concurred with the proposed plan, giving informed consent. The patient was brought to the cath lab after IV hydration was begun and oral premedication was given. The patient was further sedated with Versed and Fentanyl. The right wrist was assessed with a modified Allens test which was positive. The right wrist was prepped and draped in a sterile fashion. 1% lidocaine was used for local anesthesia. Using the modified Seldinger access technique, a 5 French sheath was placed in the right radial artery. 3 mg Verapamil was given through the sheath. Standard diagnostic catheters were used to perform selective coronary angiography. A pigtail catheter was used to perform a left ventricular angiogram. The sheath was removed from the right radial artery and a Terumo hemostasis band was applied at the arteriotomy site on the right wrist.   There were no immediate complications. The patient was taken to the recovery area in stable condition.   Hemodynamic Findings: Central aortic pressure: 141/75 Left ventricular pressure: 140/12/15  Angiographic Findings:  Left main: 10% proximal stenosis.   Left Anterior Descending Artery: Moderate caliber vessel that does not reach the apex. There are 3 small to moderate caliber diagonal branches. No  obstructive disease.   Circumflex Artery: Moderate caliber intermediate branch with no disease. The AV groove Circumflex is a moderate caliber vessel with no obstructive disease. There are several very small caliber obtuse marginal branches that arise from the Circumflex.   Right Coronary Artery: Very large dominant vessel with 30% ostial stenosis, diffuse 20% stenoses in the proximal, mid and distal vessel.   Left Ventricular Angiogram: LVEF=65%. No mitral regurgitation. The aortic root appears to be enlarged.   Impression: 1. Mild non-obstructive CAD 2. Normal LV systolic function 3. Dilated ascending aorta 4. Non-cardiac chest pain  Recommendations: Medical management of CAD. She will need a CTA of the chest to better assess the thoracic aortic aneurysm and exclude dissection. Would plan this on 04/25/14 after hydration following contrast dye load today.        Complications:  None. The patient tolerated the procedure well.

## 2014-04-24 NOTE — Progress Notes (Signed)
UR completed 

## 2014-04-24 NOTE — H&P (View-Only) (Signed)
CARDIOLOGY CONSULT NOTE  Patient ID: [name] MRN: [id number] DOB/AGE: Feb 22, 1961 54 y.o.  Admit date: 04/23/2014 Primary Physician: Dr Rosezella [place] Primary Cardiologist: New Reason for Consultation: Chest pain  HPI: 54 yo with history of HTN, DM, and hyperlipidemia was admitted yesterday with chest pain.  Patient says that about 2 weeks ago, she was working at her job in [employer].  She developed severe pain/heaviness in her chest that lasted 15-20 minutes.  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 She saw her PCP a few days later and was told that she would be set up for a stress test.  She had periodic mild chest heaviness with exertion after this.  However, on Monday, she was walking in [place] and again developed severe chest heaviness/pain that resolved with rest.  Yesterday, she had chest pain/heaviness multiple times with only mild exertion like walking around her house.  It resolved with rest.  The last time, she came to ER and pain resolved with 3 NTG. She is currently pain-free.  Troponin was negative.  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  She is a nonsmoker but mother had MI in her 64s.   Review of systems complete and found to be negative unless listed above in HPI  Past Medical History: 1. HTN 2. Type II diabetes 3. Hyperlipidemia 4. Heart murmur 5. Cholecystectomy 6. Hysterectomy  Family History  Problem Relation Age of Onset  . Heart failure Mother   . Stroke Father   . Heart failure Father   . Diabetes Mother   . Cancer Father   Mother with MI in her 66s Father with CVA   History   Social History  . Marital Status: Married    Spouse Name: N/A    Number of Children: N/A  . Years of Education: N/A   Occupational History  . Not on file.   Social History Main Topics  . Smoking status: Never Smoker   . Smokeless tobacco: Not on file  . Alcohol Use: No  . Drug Use: No  . Sexual Activity: Not on file    Other Topics Concern  . Not on file   Social History Narrative     Prescriptions prior to admission  Medication Sig Dispense Refill Last Dose  . acetaminophen (TYLENOL) 325 MG tablet Take 975 mg by mouth every 8 (eight) hours as needed for mild pain (pain).    04/22/2014 at Unknown time  . albuterol (PROVENTIL HFA;VENTOLIN HFA) 108 (90 BASE) MCG/ACT inhaler Inhale 2 puffs into the lungs every 4 (four) hours as needed for wheezing or shortness of breath. 18 g 0 2 weeks  . aspirin EC 325 MG tablet Take 325 mg by mouth daily.   04/23/2014 at Unknown time  . atenolol (TENORMIN) 50 MG tablet Take 50 mg by mouth 2 (two) times daily.   04/23/2014 at 0830  . B Complex Vitamins (VITAMIN B COMPLEX PO) Take 1 tablet by mouth daily.   04/22/2014 at Unknown time  . furosemide (LASIX) 40 MG tablet Take 80 mg by mouth 2 (two) times daily.    04/23/2014 at Unknown time  . Glucosamine-Chondroitin (MOVE FREE PO) Take 2 tablets by mouth daily.   Past Week at Unknown time  . ibuprofen (ADVIL,MOTRIN) 200 MG tablet Take 800 mg by mouth daily. Take every morning for pain in ankles  per patient   04/22/2014 at Unknown time  . meloxicam (MOBIC) 15 MG tablet Take  15 mg by mouth daily.   04/23/2014 at Unknown time  . metFORMIN (GLUCOPHAGE) 1000 MG tablet Take 1,000 mg by mouth 2 (two) times daily with a meal.   04/23/2014 at Unknown time  . Omega-3 Fatty Acids (FISH OIL PO) Take 1 capsule by mouth daily.   04/22/2014 at Unknown time  . potassium chloride SA (K-DUR,KLOR-CON) 20 MEQ tablet Take 40 mEq by mouth 2 (two) times daily.    04/23/2014 at Unknown time  . pravastatin (PRAVACHOL) 20 MG tablet Take 20 mg by mouth daily.    04/23/2014 at Unknown time  . Probiotic Product (PROBIOTIC PO) Take 1 tablet by mouth daily.   04/23/2014 at Unknown time  . HYDROcodone-acetaminophen (NORCO/VICODIN) 5-325 MG per tablet Take 1-2 tablets by mouth every 6 hours as needed for pain. (Patient not taking: Reported on 04/23/2014) 15 tablet 0   .  promethazine (PHENERGAN) 25 MG tablet Take 1 tablet (25 mg total) by mouth every 6 (six) hours as needed for nausea or vomiting. (Patient not taking: Reported on 04/23/2014) 12 tablet 0    Scheduled Meds: . aspirin EC  325 mg Oral Daily  . atenolol  50 mg Oral BID  . enoxaparin (LOVENOX) injection  40 mg Subcutaneous Q24H  . insulin aspart  0-15 Units Subcutaneous TID WC  . insulin aspart  0-5 Units Subcutaneous QHS  . pravastatin  20 mg Oral Daily   Continuous Infusions:  PRN Meds:.acetaminophen, albuterol, HYDROcodone-acetaminophen, morphine injection, ondansetron (ZOFRAN) IV  Physical exam Blood pressure 133/61, pulse 57, temperature 98.1 F (36.7 C), temperature source Oral, resp. rate 18, weight 251 lb 5.2 oz (114 kg), SpO2 98 %. General: NAD, obese Neck: No JVD, no thyromegaly or thyroid nodule.  Lungs: Clear to auscultation bilaterally with normal respiratory effort. CV: Nondisplaced PMI.  Heart regular S1/S2, no S3/S4, no murmur.  No peripheral edema.  No carotid bruit.  Normal pedal pulses.  Abdomen: Soft, nontender, no hepatosplenomegaly, no distention.  Skin: Intact without lesions or rashes.  Neurologic: Alert and oriented x 3.  Psych: Normal affect. Extremities: No clubbing or cyanosis.  HEENT: Normal.   Labs:   Lab Results  Component Value Date   WBC 7.1 04/23/2014   HGB 12.9 04/23/2014   HCT 39.5 04/23/2014   MCV 90.8 04/23/2014   PLT 268 04/23/2014    Recent Labs Lab 04/23/14 1308 04/24/14 0851  NA 142  --   K 4.0 3.5  CL 104  --   CO2 27  --   BUN 15  --   CREATININE 0.83  --   CALCIUM 9.2  --   GLUCOSE 209*  --    Lab Results  Component Value Date   CKTOTAL 53 02/16/2010   CKMB 1.0 02/16/2010   TROPONINI <0.03 04/24/2014    Radiology: - CXR: No acute abnormalities  EKG: NSR, poor RWP, LAFB, ?septal MI  ASSESSMENT AND PLAN: 54 yo with history of HTN, DM, and hyperlipidemia was admitted yesterday with chest pain. 1. Chest pain: Symptoms are  concerning for unstable angina (occurring with progressively less exertion but resolving with rest).  She has multiple RFs (DM, HTN, hyperlipidemia, family history).  I am concerned enough about this presentation that I think she needs diagnostic catheterization today.  - Will arrange left heart cath.  - Continue ASA, beta blocker.  Will need higher dose of statin if CAD confirmed.  2. ?CHF: Patient is on a high dose of Lasix.  She says she has been on  this for years for "swelling."  She does not have significant dyspnea.  She is not significantly volume overloaded on exam.  BNP is mildly elevated.  Will need echocardiogram.  3. Diabetes: Per primary service.   Loralie Champagne 04/24/2014 10:27 AM

## 2014-04-24 NOTE — Progress Notes (Addendum)
[facility]                                                                              Patient Demographics  [name], is a 54 y.o. female, DOB - Dec 31, 1960, WNU:[id number]  Admit date - 04/23/2014   Admitting Physician Berle Mull, MD  Outpatient Primary MD for the patient is Kristine Garbe, MD  LOS - 1   Chief Complaint  Patient presents with  . Chest Pain      HPI on 04/23/2014 by Dr. Berle Mull [name] is a 54 y.o. female with Past medical history of hypertension, diabetes mellitus, heart murmur, dyslipidemia. The patient is presenting with complaints of chest pain. Her symptoms have been ongoing for the last 2 weeks and having progressively worsening. She mentions that the pain occurs substernally and it feels like tightness and primarily occurs on exertion of more than her daily activity. The symptom resolves when she rests. She started having the symptoms on Wednesday and saw her PCP and was scheduled to see cardiology as an outpatient. But she continues to have the symptoms and then was referred to ER for further workup. At the time of my evaluation the patient is pain-free.She also complains of nausea and dry heaving with this symptoms of chest tightness. She denies any leg swelling orthopnea PND denies any recent travel or immobilization denies any leg tenderness. She denies any recent change in her medication. She is compliant with all her medications. She had a stress test many years ago but no invasive cardiac workup. Family history of heart failure, A. fib and CVA in father but no significant history of myocardial infarction. Patient mentions she has significant joint pains and arthritis and has been using ibuprofen twice a day as well as meloxicam.  Assessment & Plan  Chest pain/? Unstable angina -Troponin cycled, and currently negative -Patient has had chest tightness, nausea, shortness of breath with exertion which resolved with rest -Patient states  she was seen by her primary care physician a few weeks ago and was supposed to see a cardiologist for possible stress test -Patient does have several risk factors for coronary artery disease including hypertension, diabetes, dyslipidemia and family history -Cardiology consulted and appreciated the recommended diagnostic catheterization today -Continue aspirin and beta blockers after catheterization, patient may need higher dose statin if patient does have coronary artery disease  Diabetes Mellitus, type II -Metformin held, continue sliding insulin scale with CBG monitoring -Hemoglobin A1c 7.4  Dyslipidemia -Continue statin  Arthritis -Continue pain control -Hold mobic  Code Status: Full  Family Communication: none at bedside  Disposition Plan: Admitted  Time Spent in minutes   30 minutes  Procedures  Left heart cath  Consults   Cardiology  DVT Prophylaxis  Lovenox  Lab Results  Component Value Date   PLT 268 04/23/2014    Medications  Scheduled Meds: . aspirin  81 mg Oral Pre-Cath  . aspirin EC  325 mg Oral Daily  . atenolol  50 mg Oral BID  . enoxaparin (LOVENOX) injection  40 mg Subcutaneous Q24H  . [START ON 04/25/2014] Influenza vac split quadrivalent PF  0.5 mL Intramuscular Tomorrow-1000  . insulin aspart  0-15 Units Subcutaneous TID WC  . insulin aspart  0-5 Units Subcutaneous QHS  . [START ON 04/25/2014] pneumococcal 23 valent vaccine  0.5 mL Intramuscular Tomorrow-1000  . pravastatin  20 mg Oral Daily  . sodium chloride  3 mL Intravenous Q12H   Continuous Infusions: . sodium chloride 1 mL/kg/hr (04/24/14 1301)   PRN Meds:.sodium chloride, acetaminophen, albuterol, HYDROcodone-acetaminophen, morphine injection, ondansetron (ZOFRAN) IV, sodium chloride  Antibiotics    Anti-infectives    None      Subjective:   [name] seen and examined today.  Patient continues to have some "heaviness/tightness".  She denies [place] of breath, nausea, headache,  dizziness.  Objective:   Filed Vitals:   04/24/14 0041 04/24/14 0415 04/24/14 0934 04/24/14 1317  BP: 126/48 124/55 133/61 149/88  Pulse: 60 55 57 63  Temp: 97.9 F (36.6 C) 98.1 F (36.7 C) 98.1 F (36.7 C) 97.7 F (36.5 C)  TempSrc: Oral Oral Oral Oral  Resp: 16 17 18 17   Height: 5\' 7"  (1.702 m)     Weight: 114 kg (251 lb 5.2 oz)     SpO2: 97% 97% 98% 98%    Wt Readings from Last 3 Encounters:  04/24/14 114 kg (251 lb 5.2 oz)  12/26/13 109.77 kg (242 lb)  06/28/13 112.492 kg (248 lb)     Intake/Output Summary (Last 24 hours) at 04/24/14 1526 Last data filed at 04/24/14 1317  Gross per 24 hour  Intake      0 ml  Output      0 ml  Net      0 ml    Exam  General: Well developed, well nourished, NAD, appears stated age  HEENT: NCAT,  mucous membranes moist.   Cardiovascular: S1 S2 auscultated, no rubs, murmurs or gallops. Regular rate and rhythm.  Respiratory: Clear to auscultation bilaterally with equal chest rise  Abdomen: Soft, obese, nontender, nondistended, + bowel sounds  Extremities: warm dry without cyanosis clubbing or edema  Neuro: AAOx3, nonfocal  Psych: Normal affect and demeanor with intact judgement and insight  Data Review   Micro Results No results found for this or any previous visit (from the past 240 hour(s)).  Radiology Reports Dg Chest 2 View  04/23/2014   CLINICAL DATA:  Mid chest pain, shortness of breath for 2 weeks  EXAM: CHEST  2 VIEW  COMPARISON:  09/07/2013  FINDINGS: The heart size and mediastinal contours are within normal limits. Both lungs are clear. The visualized skeletal structures are unremarkable.  IMPRESSION: No active cardiopulmonary disease.   Electronically Signed   By: Kathreen Devoid   On: 04/23/2014 14:09    CBC  Recent Labs Lab 04/23/14 1308  WBC 7.1  HGB 12.9  HCT 39.5  PLT 268  MCV 90.8  MCH 29.7  MCHC 32.7  RDW 13.1    Chemistries   Recent Labs Lab 04/23/14 1308 04/24/14 0851  NA 142  --     K 4.0 3.5  CL 104  --   CO2 27  --   GLUCOSE 209*  --   BUN 15  --   CREATININE 0.83  --   CALCIUM 9.2  --    ------------------------------------------------------------------------------------------------------------------ estimated creatinine clearance is 103.4 mL/min (by C-G formula based on Cr of 0.83). ------------------------------------------------------------------------------------------------------------------  Recent Labs  04/23/14 2151  HGBA1C 7.4*   ------------------------------------------------------------------------------------------------------------------ No results for input(s): CHOL, HDL, LDLCALC, TRIG, CHOLHDL, LDLDIRECT in the last 72 hours. ------------------------------------------------------------------------------------------------------------------ No results for input(s): TSH, T4TOTAL, T3FREE, THYROIDAB  in the last 72 hours.  Invalid input(s): FREET3 ------------------------------------------------------------------------------------------------------------------ No results for input(s): VITAMINB12, FOLATE, FERRITIN, TIBC, IRON, RETICCTPCT in the last 72 hours.  Coagulation profile  Recent Labs Lab 04/24/14 1352  INR 1.03    No results for input(s): DDIMER in the last 72 hours.  Cardiac Enzymes  Recent Labs Lab 04/23/14 2151 04/24/14 0300 04/24/14 0851  TROPONINI <0.03 <0.03 <0.03   ------------------------------------------------------------------------------------------------------------------ Invalid input(s): POCBNP    [name] D.O. on 04/24/2014 at 3:26 PM  Between 7am to 7pm - Pager - [phone]  After 7pm go to [email or web address] - password [email or web address]  And look for the night coverage person covering for me after hours  [facility] Office  [phone]

## 2014-04-24 NOTE — Consult Note (Signed)
CARDIOLOGY CONSULT NOTE  Patient ID: [name] MRN: [id number] DOB/AGE: 1961-03-08 54 y.o.  Admit date: 04/23/2014 Primary Physician: Dr Rosezella [place] Primary Cardiologist: New Reason for Consultation: Chest pain  HPI: 54 yo with history of HTN, DM, and hyperlipidemia was admitted yesterday with chest pain.  Patient says that about 2 weeks ago, she was working at her job in [employer].  She developed severe pain/heaviness in her chest that lasted 15-20 minutes.  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 She saw her PCP a few days later and was told that she would be set up for a stress test.  She had periodic mild chest heaviness with exertion after this.  However, on Monday, she was walking in [place] and again developed severe chest heaviness/pain that resolved with rest.  Yesterday, she had chest pain/heaviness multiple times with only mild exertion like walking around her house.  It resolved with rest.  The last time, she came to ER and pain resolved with 3 NTG. She is currently pain-free.  Troponin was negative.  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  She is a nonsmoker but mother had MI in her 48s.   Review of systems complete and found to be negative unless listed above in HPI  Past Medical History: 1. HTN 2. Type II diabetes 3. Hyperlipidemia 4. Heart murmur 5. Cholecystectomy 6. Hysterectomy  Family History  Problem Relation Age of Onset  . Heart failure Mother   . Stroke Father   . Heart failure Father   . Diabetes Mother   . Cancer Father   Mother with MI in her 16s Father with CVA   History   Social History  . Marital Status: Married    Spouse Name: N/A    Number of Children: N/A  . Years of Education: N/A   Occupational History  . Not on file.   Social History Main Topics  . Smoking status: Never Smoker   . Smokeless tobacco: Not on file  . Alcohol Use: No  . Drug Use: No  . Sexual Activity: Not on file    Other Topics Concern  . Not on file   Social History Narrative     Prescriptions prior to admission  Medication Sig Dispense Refill Last Dose  . acetaminophen (TYLENOL) 325 MG tablet Take 975 mg by mouth every 8 (eight) hours as needed for mild pain (pain).    04/22/2014 at Unknown time  . albuterol (PROVENTIL HFA;VENTOLIN HFA) 108 (90 BASE) MCG/ACT inhaler Inhale 2 puffs into the lungs every 4 (four) hours as needed for wheezing or shortness of breath. 18 g 0 2 weeks  . aspirin EC 325 MG tablet Take 325 mg by mouth daily.   04/23/2014 at Unknown time  . atenolol (TENORMIN) 50 MG tablet Take 50 mg by mouth 2 (two) times daily.   04/23/2014 at 0830  . B Complex Vitamins (VITAMIN B COMPLEX PO) Take 1 tablet by mouth daily.   04/22/2014 at Unknown time  . furosemide (LASIX) 40 MG tablet Take 80 mg by mouth 2 (two) times daily.    04/23/2014 at Unknown time  . Glucosamine-Chondroitin (MOVE FREE PO) Take 2 tablets by mouth daily.   Past Week at Unknown time  . ibuprofen (ADVIL,MOTRIN) 200 MG tablet Take 800 mg by mouth daily. Take every morning for pain in ankles  per patient   04/22/2014 at Unknown time  . meloxicam (MOBIC) 15 MG tablet Take  15 mg by mouth daily.   04/23/2014 at Unknown time  . metFORMIN (GLUCOPHAGE) 1000 MG tablet Take 1,000 mg by mouth 2 (two) times daily with a meal.   04/23/2014 at Unknown time  . Omega-3 Fatty Acids (FISH OIL PO) Take 1 capsule by mouth daily.   04/22/2014 at Unknown time  . potassium chloride SA (K-DUR,KLOR-CON) 20 MEQ tablet Take 40 mEq by mouth 2 (two) times daily.    04/23/2014 at Unknown time  . pravastatin (PRAVACHOL) 20 MG tablet Take 20 mg by mouth daily.    04/23/2014 at Unknown time  . Probiotic Product (PROBIOTIC PO) Take 1 tablet by mouth daily.   04/23/2014 at Unknown time  . HYDROcodone-acetaminophen (NORCO/VICODIN) 5-325 MG per tablet Take 1-2 tablets by mouth every 6 hours as needed for pain. (Patient not taking: Reported on 04/23/2014) 15 tablet 0   .  promethazine (PHENERGAN) 25 MG tablet Take 1 tablet (25 mg total) by mouth every 6 (six) hours as needed for nausea or vomiting. (Patient not taking: Reported on 04/23/2014) 12 tablet 0    Scheduled Meds: . aspirin EC  325 mg Oral Daily  . atenolol  50 mg Oral BID  . enoxaparin (LOVENOX) injection  40 mg Subcutaneous Q24H  . insulin aspart  0-15 Units Subcutaneous TID WC  . insulin aspart  0-5 Units Subcutaneous QHS  . pravastatin  20 mg Oral Daily   Continuous Infusions:  PRN Meds:.acetaminophen, albuterol, HYDROcodone-acetaminophen, morphine injection, ondansetron (ZOFRAN) IV  Physical exam Blood pressure 133/61, pulse 57, temperature 98.1 F (36.7 C), temperature source Oral, resp. rate 18, weight 251 lb 5.2 oz (114 kg), SpO2 98 %. General: NAD, obese Neck: No JVD, no thyromegaly or thyroid nodule.  Lungs: Clear to auscultation bilaterally with normal respiratory effort. CV: Nondisplaced PMI.  Heart regular S1/S2, no S3/S4, no murmur.  No peripheral edema.  No carotid bruit.  Normal pedal pulses.  Abdomen: Soft, nontender, no hepatosplenomegaly, no distention.  Skin: Intact without lesions or rashes.  Neurologic: Alert and oriented x 3.  Psych: Normal affect. Extremities: No clubbing or cyanosis.  HEENT: Normal.   Labs:   Lab Results  Component Value Date   WBC 7.1 04/23/2014   HGB 12.9 04/23/2014   HCT 39.5 04/23/2014   MCV 90.8 04/23/2014   PLT 268 04/23/2014    Recent Labs Lab 04/23/14 1308 04/24/14 0851  NA 142  --   K 4.0 3.5  CL 104  --   CO2 27  --   BUN 15  --   CREATININE 0.83  --   CALCIUM 9.2  --   GLUCOSE 209*  --    Lab Results  Component Value Date   CKTOTAL 53 02/16/2010   CKMB 1.0 02/16/2010   TROPONINI <0.03 04/24/2014    Radiology: - CXR: No acute abnormalities  EKG: NSR, poor RWP, LAFB, ?septal MI  ASSESSMENT AND PLAN: 54 yo with history of HTN, DM, and hyperlipidemia was admitted yesterday with chest pain. 1. Chest pain: Symptoms are  concerning for unstable angina (occurring with progressively less exertion but resolving with rest).  She has multiple RFs (DM, HTN, hyperlipidemia, family history).  I am concerned enough about this presentation that I think she needs diagnostic catheterization today.  - Will arrange left heart cath.  - Continue ASA, beta blocker.  Will need higher dose of statin if CAD confirmed.  2. ?CHF: Patient is on a high dose of Lasix.  She says she has been on  this for years for "swelling."  She does not have significant dyspnea.  She is not significantly volume overloaded on exam.  BNP is mildly elevated.  Will need echocardiogram.  3. Diabetes: Per primary service.   Loralie Champagne 04/24/2014 10:27 AM

## 2014-04-25 ENCOUNTER — Observation Stay (HOSPITAL_COMMUNITY): Payer: BC Managed Care – PPO

## 2014-04-25 ENCOUNTER — Encounter (HOSPITAL_COMMUNITY): Payer: Self-pay | Admitting: Cardiovascular Disease

## 2014-04-25 DIAGNOSIS — I712 Thoracic aortic aneurysm, without rupture, unspecified: Secondary | ICD-10-CM | POA: Insufficient documentation

## 2014-04-25 DIAGNOSIS — I10 Essential (primary) hypertension: Secondary | ICD-10-CM | POA: Insufficient documentation

## 2014-04-25 DIAGNOSIS — R079 Chest pain, unspecified: Secondary | ICD-10-CM | POA: Insufficient documentation

## 2014-04-25 DIAGNOSIS — E876 Hypokalemia: Secondary | ICD-10-CM

## 2014-04-25 DIAGNOSIS — I209 Angina pectoris, unspecified: Secondary | ICD-10-CM

## 2014-04-25 LAB — GLUCOSE, CAPILLARY
Glucose-Capillary: 149 mg/dL — ABNORMAL HIGH (ref 70–99)
Glucose-Capillary: 188 mg/dL — ABNORMAL HIGH (ref 70–99)

## 2014-04-25 LAB — BASIC METABOLIC PANEL
Anion gap: 9 (ref 5–15)
BUN: 14 mg/dL (ref 6–23)
CHLORIDE: 105 mmol/L (ref 96–112)
CO2: 27 mmol/L (ref 19–32)
Calcium: 8.5 mg/dL (ref 8.4–10.5)
Creatinine, Ser: 0.81 mg/dL (ref 0.50–1.10)
GFR calc non Af Amer: 82 mL/min — ABNORMAL LOW (ref >90)
Glucose, Bld: 162 mg/dL — ABNORMAL HIGH (ref 70–99)
Potassium: 3 mmol/L — ABNORMAL LOW (ref 3.5–5.1)
SODIUM: 141 mmol/L (ref 135–145)

## 2014-04-25 LAB — CBC
HEMATOCRIT: 38.1 % (ref 36.0–46.0)
HEMOGLOBIN: 12.2 g/dL (ref 12.0–15.0)
MCH: 29 pg (ref 26.0–34.0)
MCHC: 32 g/dL (ref 30.0–36.0)
MCV: 90.5 fL (ref 78.0–100.0)
Platelets: 249 10*3/uL (ref 150–400)
RBC: 4.21 MIL/uL (ref 3.87–5.11)
RDW: 13.2 % (ref 11.5–15.5)
WBC: 6.1 10*3/uL (ref 4.0–10.5)

## 2014-04-25 MED ORDER — FUROSEMIDE 80 MG PO TABS
80.0000 mg | ORAL_TABLET | Freq: Two times a day (BID) | ORAL | Status: DC
  Administered 2014-04-25: 80 mg via ORAL
  Filled 2014-04-25 (×3): qty 1

## 2014-04-25 MED ORDER — IOHEXOL 350 MG/ML SOLN
100.0000 mL | Freq: Once | INTRAVENOUS | Status: AC | PRN
  Administered 2014-04-25: 100 mL via INTRAVENOUS

## 2014-04-25 MED ORDER — POTASSIUM CHLORIDE CRYS ER 20 MEQ PO TBCR
40.0000 meq | EXTENDED_RELEASE_TABLET | Freq: Two times a day (BID) | ORAL | Status: DC
  Administered 2014-04-25: 40 meq via ORAL
  Filled 2014-04-25: qty 2

## 2014-04-25 MED ORDER — METFORMIN HCL 1000 MG PO TABS: 1000.0000 mg | ORAL_TABLET | Freq: Two times a day (BID) | ORAL | Status: DC

## 2014-04-25 NOTE — Discharge Instructions (Signed)
Chest Pain (Nonspecific) °It is often hard to give a specific diagnosis for the cause of chest pain. There is always a chance that your pain could be related to something serious, such as a heart attack or a blood clot in the lungs. You need to follow up with your health care provider for further evaluation. °CAUSES  °· Heartburn. °· Pneumonia or bronchitis. °· Anxiety or stress. °· Inflammation around your heart (pericarditis) or lung (pleuritis or pleurisy). °· A blood clot in the lung. °· A collapsed lung (pneumothorax). It can develop suddenly on its own (spontaneous pneumothorax) or from trauma to the chest. °· Shingles infection (herpes zoster virus). °The chest wall is composed of bones, muscles, and cartilage. Any of these can be the source of the pain. °· The bones can be bruised by injury. °· The muscles or cartilage can be strained by coughing or overwork. °· The cartilage can be affected by inflammation and become sore (costochondritis). °DIAGNOSIS  °Lab tests or other studies may be needed to find the cause of your pain. Your health care provider may have you take a test called an ambulatory electrocardiogram (ECG). An ECG records your heartbeat patterns over a 24-hour period. You may also have other tests, such as: °· Transthoracic echocardiogram (TTE). During echocardiography, sound waves are used to evaluate how blood flows through your heart. °· Transesophageal echocardiogram (TEE). °· Cardiac monitoring. This allows your health care provider to monitor your heart rate and rhythm in real time. °· Holter monitor. This is a portable device that records your heartbeat and can help diagnose heart arrhythmias. It allows your health care provider to track your heart activity for several days, if needed. °· Stress tests by exercise or by giving medicine that makes the heart beat faster. °TREATMENT  °· Treatment depends on what may be causing your chest pain. Treatment may include: °¨ Acid blockers for  heartburn. °¨ Anti-inflammatory medicine. °¨ Pain medicine for inflammatory conditions. °¨ Antibiotics if an infection is present. °· You may be advised to change lifestyle habits. This includes stopping smoking and avoiding alcohol, caffeine, and chocolate. °· You may be advised to keep your head raised (elevated) when sleeping. This reduces the chance of acid going backward from your stomach into your esophagus. °Most of the time, nonspecific chest pain will improve within 2-3 days with rest and mild pain medicine.  °HOME CARE INSTRUCTIONS  °· If antibiotics were prescribed, take them as directed. Finish them even if you start to feel better. °· For the next few days, avoid physical activities that bring on chest pain. Continue physical activities as directed. °· Do not use any tobacco products, including cigarettes, chewing tobacco, or electronic cigarettes. °· Avoid drinking alcohol. °· Only take medicine as directed by your health care provider. °· Follow your health care provider's suggestions for further testing if your chest pain does not go away. °· Keep any follow-up appointments you made. If you do not go to an appointment, you could develop lasting (chronic) problems with pain. If there is any problem keeping an appointment, call to reschedule. °SEEK MEDICAL CARE IF:  °· Your chest pain does not go away, even after treatment. °· You have a rash with blisters on your chest. °· You have a fever. °SEEK IMMEDIATE MEDICAL CARE IF:  °· You have increased chest pain or pain that spreads to your arm, neck, jaw, back, or abdomen. °· You have shortness of breath. °· You have an increasing cough, or you cough   up blood.  You have severe back or abdominal pain.  You feel nauseous or vomit.  You have severe weakness.  You faint.  You have chills. This is an emergency. Do not wait to see if the pain will go away. Get medical help at once. Call your local emergency services (911 in [place] Do not drive  yourself to the hospital. MAKE SURE YOU:   Understand these instructions.  Will watch your condition.  Will get help right away if you are not doing well or get worse. Document Released: 12/23/2004 Document Revised: 03/20/2013 Document Reviewed: 10/19/2007 [facility] Patient Information 2015 [place], [place]. This information is not intended to replace advice given to you by your health care provider. Make sure you discuss any questions you have with your health care provider.    Work Note: To whom it concern:   [name]  was admitted to the Hospital from 04/23/2014 to 04/25/2014 and was discharged on 04/25/2014.[name] should be excused from work starting1/26/2016 and may return to work without any restrictions on 04/29/2014.  Call [facility] office [phone] for any questions.  Sincerely,   Cristal Ford, DO [facility] 04/25/2014, 2:25 PM

## 2014-04-25 NOTE — Progress Notes (Signed)
[facility]                                                                              Patient Demographics  [name], is a 54 y.o. female, DOB - 1960/10/25, PHX:[id number]  Admit date - 04/23/2014   Admitting Physician Berle Mull, MD  Outpatient Primary MD for the patient is Kristine Garbe, MD  LOS - 2   Chief Complaint  Patient presents with  . Chest Pain      HPI on 04/23/2014 by Dr. Berle Mull [name] is a 54 y.o. female with Past medical history of hypertension, diabetes mellitus, heart murmur, dyslipidemia. The patient is presenting with complaints of chest pain. Her symptoms have been ongoing for the last 2 weeks and having progressively worsening. She mentions that the pain occurs substernally and it feels like tightness and primarily occurs on exertion of more than her daily activity. The symptom resolves when she rests. She started having the symptoms on Wednesday and saw her PCP and was scheduled to see cardiology as an outpatient. But she continues to have the symptoms and then was referred to ER for further workup. At the time of my evaluation the patient is pain-free.She also complains of nausea and dry heaving with this symptoms of chest tightness. She denies any leg swelling orthopnea PND denies any recent travel or immobilization denies any leg tenderness. She denies any recent change in her medication. She is compliant with all her medications. She had a stress test many years ago but no invasive cardiac workup. Family history of heart failure, A. fib and CVA in father but no significant history of myocardial infarction. Patient mentions she has significant joint pains and arthritis and has been using ibuprofen twice a day as well as meloxicam.  Assessment & Plan  Chest pain/? Unstable angina -Patient currently chest pain free -Troponin cycled, and currently negative -Patient has had chest tightness, nausea, shortness of breath with exertion which  resolved with rest -Patient states she was seen by her primary care physician a few weeks ago and was supposed to see a cardiologist for possible stress test -Patient does have several risk factors for coronary artery disease including hypertension, diabetes, dyslipidemia and family history -Cardiology consulted and appreciated the recommended diagnostic catheterization on 04/24/2014: mild nonobstructive coronary disease, recommended medical management and CTA to evaluate thoracic aneurysm  -Continue aspirin and beta blockers, statin -Echocardiogram pending -Will obtain CTA to evaluated for dissection  Diabetes Mellitus, type II -Metformin held, continue sliding insulin scale with CBG monitoring -Hemoglobin A1c 7.4  Dyslipidemia -Continue statin  Arthritis -Continue pain control -Hold mobic  Hypertension -Controlled -Will restart lasix today, continue atenolol  Hypokalemia -Will continue to monitor BMP and replace as needed -Continue home dose of KCl  Code Status: Full  Family Communication: none at bedside  Disposition Plan: Admitted  Time Spent in minutes   30 minutes  Procedures  Left heart cath  Consults   Cardiology  DVT Prophylaxis  Lovenox  Lab Results  Component Value Date   PLT 249 04/25/2014    Medications  Scheduled Meds: . aspirin EC  325 mg Oral Daily  . atenolol  50 mg  Oral BID  . furosemide  80 mg Oral BID WC  . Influenza vac split quadrivalent PF  0.5 mL Intramuscular Tomorrow-1000  . insulin aspart  0-15 Units Subcutaneous TID WC  . insulin aspart  0-5 Units Subcutaneous QHS  . pneumococcal 23 valent vaccine  0.5 mL Intramuscular Tomorrow-1000  . potassium chloride SA  40 mEq Oral BID  . pravastatin  20 mg Oral Daily   Continuous Infusions:   PRN Meds:.acetaminophen, albuterol, HYDROcodone-acetaminophen, morphine injection, ondansetron (ZOFRAN) IV  Antibiotics    Anti-infectives    None      Subjective:   [name] seen and  examined today.  Patient denies further chest pain but still feels "off."  She is worried that she has not received her lasix or potassium since her admission.  Patient denies SOB, abdominal pain, nausea, vomiting, headache, dizziness.  Objective:   Filed Vitals:   04/24/14 2002 04/24/14 2135 04/25/14 0440 04/25/14 1022  BP: 128/68 129/61 124/63 140/75  Pulse: 66 65 65 60  Temp: 99 F (37.2 C)  98.7 F (37.1 C)   TempSrc: Oral  Oral   Resp: 19  16   Height:      Weight:      SpO2: 99%  99%     Wt Readings from Last 3 Encounters:  04/24/14 114 kg (251 lb 5.2 oz)  12/26/13 109.77 kg (242 lb)  06/28/13 112.492 kg (248 lb)     Intake/Output Summary (Last 24 hours) at 04/25/14 1139 Last data filed at 04/24/14 1317  Gross per 24 hour  Intake      0 ml  Output      0 ml  Net      0 ml    Exam  General: Well developed, well nourished, NAD, appears stated age  HEENT: NCAT,  mucous membranes moist.   Cardiovascular: S1 S2 auscultated, no rubs, murmurs or gallops. Regular rate and rhythm.  Respiratory: Clear to auscultation bilaterally with equal chest rise  Abdomen: Soft, obese, nontender, nondistended, + bowel sounds  Extremities: warm dry without cyanosis clubbing or edema  Neuro: AAOx3, nonfocal  Psych: Appropriate  Data Review   Micro Results No results found for this or any previous visit (from the past 240 hour(s)).  Radiology Reports Dg Chest 2 View  04/23/2014   CLINICAL DATA:  Mid chest pain, shortness of breath for 2 weeks  EXAM: CHEST  2 VIEW  COMPARISON:  09/07/2013  FINDINGS: The heart size and mediastinal contours are within normal limits. Both lungs are clear. The visualized skeletal structures are unremarkable.  IMPRESSION: No active cardiopulmonary disease.   Electronically Signed   By: Kathreen Devoid   On: 04/23/2014 14:09    CBC  Recent Labs Lab 04/23/14 1308 04/25/14 0655  WBC 7.1 6.1  HGB 12.9 12.2  HCT 39.5 38.1  PLT 268 249  MCV 90.8  90.5  MCH 29.7 29.0  MCHC 32.7 32.0  RDW 13.1 13.2    Chemistries   Recent Labs Lab 04/23/14 1308 04/24/14 0851 04/25/14 0655  NA 142  --  141  K 4.0 3.5 3.0*  CL 104  --  105  CO2 27  --  27  GLUCOSE 209*  --  162*  BUN 15  --  14  CREATININE 0.83  --  0.81  CALCIUM 9.2  --  8.5   ------------------------------------------------------------------------------------------------------------------ estimated creatinine clearance is 105.9 mL/min (by C-G formula based on Cr of 0.81). ------------------------------------------------------------------------------------------------------------------  Recent Labs  04/23/14 2151  HGBA1C 7.4*   ------------------------------------------------------------------------------------------------------------------ No results for input(s): CHOL, HDL, LDLCALC, TRIG, CHOLHDL, LDLDIRECT in the last 72 hours. ------------------------------------------------------------------------------------------------------------------ No results for input(s): TSH, T4TOTAL, T3FREE, THYROIDAB in the last 72 hours.  Invalid input(s): FREET3 ------------------------------------------------------------------------------------------------------------------ No results for input(s): VITAMINB12, FOLATE, FERRITIN, TIBC, IRON, RETICCTPCT in the last 72 hours.  Coagulation profile  Recent Labs Lab 04/24/14 1352  INR 1.03    No results for input(s): DDIMER in the last 72 hours.  Cardiac Enzymes  Recent Labs Lab 04/23/14 2151 04/24/14 0300 04/24/14 0851  TROPONINI <0.03 <0.03 <0.03   ------------------------------------------------------------------------------------------------------------------ Invalid input(s): POCBNP    [name] D.O. on 04/25/2014 at 11:39 AM  Between 7am to 7pm - Pager - [phone]  After 7pm go to [email or web address] - password [email or web address]  And look for the night coverage person covering for me after hours  [facility]  Group Office  [phone]

## 2014-04-25 NOTE — Progress Notes (Signed)
04/25/2014 3:53 PM D/c avs form, medications already taken today and those due this evening given and explained to patient. Follow up appointments and when to call MD reviewed.  Resumption of metformin instructions reviewed. Cath site care and activity restrictions reviewed. RX reviewed. D/c iv. D/c tele. D/c home per orders.  Monia Timmers, Arville Lime

## 2014-04-25 NOTE — Discharge Summary (Addendum)
Physician Discharge Summary  [name]:[id number] DOB: [birth year] DOA: 04/23/2014  PCP: Kristine Garbe, MD  Admit date: 04/23/2014 Discharge date: 04/25/2014  Time spent: 45 minutes  Recommendations for Outpatient Follow-up:  Patient will need to follow up with her primary care physician within 1 week of discharge, and should discuss and endocrinology referral for possible ultrasound and biopsy of her thyroid mass.  Patient should also be referred to gastroenterology for repeat colonoscopy (10 years since last colonoscopy).  She should continue her medications as prescribed.  Patient should not take her metformin for atleast 48 hours.  She will will need to continue a heart healthy/carb modified diet.  Patient may resume activity as tolerated.  Discharge Diagnoses:  Principal Problem:   Chest pain Active Problems:   Type 2 diabetes mellitus   Hypertension   Hypercholesteremia   Anginal pain   Pain in the chest   Essential hypertension   Thoracic aortic aneurysm   Discharge Condition: Stable  Diet recommendation: Heart healthy/carb modified  Filed Weights   04/24/14 0041  Weight: 114 kg (251 lb 5.2 oz)    History of present illness:  on 04/23/2014 by Dr. Berle Mull [name] is a 54 y.o. female with Past medical history of hypertension, diabetes mellitus, heart murmur, dyslipidemia. The patient is presenting with complaints of chest pain. Her symptoms have been ongoing for the last 2 weeks and having progressively worsening. She mentions that the pain occurs substernally and it feels like tightness and primarily occurs on exertion of more than her daily activity. The symptom resolves when she rests. She started having the symptoms on Wednesday and saw her PCP and was scheduled to see cardiology as an outpatient. But she continues to have the symptoms and then was referred to ER for further workup. At the time of my evaluation the patient is pain-free.She also  complains of nausea and dry heaving with this symptoms of chest tightness. She denies any leg swelling orthopnea PND denies any recent travel or immobilization denies any leg tenderness. She denies any recent change in her medication. She is compliant with all her medications. She had a stress test many years ago but no invasive cardiac workup. Family history of heart failure, A. fib and CVA in father but no significant history of myocardial infarction. Patient mentions she has significant joint pains and arthritis and has been using ibuprofen twice a day as well as meloxicam.  Hospital Course:  Chest pain/? Unstable angina -Patient currently chest pain free -Troponin cycled, and currently negative -Patient has had chest tightness, nausea, shortness of breath with exertion which resolved with rest -Patient states she was seen by her primary care physician a few weeks ago and was supposed to see a cardiologist for possible stress test -Patient does have several risk factors for coronary artery disease including hypertension, diabetes, dyslipidemia and family history -Cardiology consulted and appreciated the recommended diagnostic catheterization on 04/24/2014: mild nonobstructive coronary disease, recommended medical management and CTA to evaluate thoracic aneurysm  -Continue aspirin and beta blockers, statin -Echocardiogram pending  Thoracic aneursym -CTA showed 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  She will need to have and ultrasound and possible biopsy, with endocrinology referral.  Diabetes Mellitus, type II -  Metformin held (and should be held for 2 days as patient was given contrast) -Was placed on sliding insulin scale with  CBG monitoring during her hospitalization -Hemoglobin A1c 7.4  Dyslipidemia -Continue statin  Arthritis -Continue pain control  Hypertension -Controlled -Continue lasix and atenolol  Hypokalemia -replaced -Continue home dose of KCl  Procedures  Left heart cath Echocardiogram  Consults  Cardiology  Discharge Exam: Filed Vitals:   04/25/14 1022  BP: 140/75  Pulse: 60  Temp:   Resp:    Exam  General: Well developed, well nourished, NAD, appears stated age  HEENT: NCAT, mucous membranes moist.   Cardiovascular: S1 S2 auscultated, no rubs, murmurs or gallops. Regular rate and rhythm.  Respiratory: Clear to auscultation bilaterally with equal chest rise  Abdomen: Soft, obese, nontender, nondistended, + bowel sounds  Extremities: warm dry without cyanosis clubbing or edema  Neuro: AAOx3, nonfocal  Psych: Appropriate  Discharge Instructions      Discharge Instructions    Discharge instructions    Complete by:  As directed   Patient will need to follow up with her primary care physician within 1 week of discharge, and should discuss and endocrinology referral for possible ultrasound and biopsy of her thyroid mass.  Patient should also be referred to gastroenterology for repeat colonoscopy (10 years since last colonoscopy).  She should continue her medications as prescribed.  Patient should not take her metformin for 3 days.  She will will need to continue a heart healthy/carb modified diet.  Patient may resume activity as tolerated            Medication List    STOP taking these medications        HYDROcodone-acetaminophen 5-325 MG per tablet  Commonly known as:  NORCO/VICODIN     ibuprofen 200 MG tablet  Commonly known as:  ADVIL,MOTRIN     promethazine 25 MG tablet  Commonly known as:  PHENERGAN      TAKE these medications        acetaminophen 325 MG tablet  Commonly known as:  TYLENOL  Take 975 mg by mouth every 8 (eight) hours as needed  for mild pain (pain).     albuterol 108 (90 BASE) MCG/ACT inhaler  Commonly known as:  PROVENTIL HFA;VENTOLIN HFA  Inhale 2 puffs into the lungs every 4 (four) hours as needed for wheezing or shortness of breath.     aspirin EC 325 MG tablet  Take 325 mg by mouth daily.     atenolol 50 MG tablet  Commonly known as:  TENORMIN  Take 50 mg by mouth 2 (two) times daily.     FISH OIL PO  Take 1 capsule by mouth daily.     furosemide 40 MG tablet  Commonly known as:  LASIX  Take 80 mg by mouth 2 (two) times daily.     meloxicam 15 MG tablet  Commonly known as:  MOBIC  Take 15 mg by mouth daily.     metFORMIN 1000 MG tablet  Commonly known as:  GLUCOPHAGE  Take 1 tablet (1,000 mg total) by mouth 2 (two) times daily with a meal.  Start taking on:  04/29/2014     MOVE FREE PO  Take 2 tablets by mouth daily.     potassium chloride SA 20 MEQ tablet  Commonly known as:  K-DUR,KLOR-CON  Take 40 mEq by mouth 2 (two) times daily.     pravastatin 20 MG tablet  Commonly known as:  PRAVACHOL  Take  20 mg by mouth daily.     PROBIOTIC PO  Take 1 tablet by mouth daily.     VITAMIN B COMPLEX PO  Take 1 tablet by mouth daily.       Allergies  Allergen Reactions  . Lorabid [Loracarbef] Anaphylaxis  . Codeine Nausea And Vomiting  . Sulfa Drugs Cross Reactors Other (See Comments)    Severe allergic reaction as a child - was not told symptoms  . Benicar [Olmesartan Medoxomil] Swelling and Rash  . Other Rash    Green peas   Follow-up Information    Follow up with REESE,BETTI D, MD. Schedule an appointment as soon as possible for a visit in 1 week.   Specialty:  Family Medicine   Why:  Hospital followup, Followup on thyroid mass.   Contact information:   [address] [place] [address] [phone]        The results of significant diagnostics from this hospitalization (including imaging, microbiology, ancillary and laboratory) are listed below for reference.     Significant Diagnostic Studies: Dg Chest 2 View  04/23/2014   CLINICAL DATA:  Mid chest pain, shortness of breath for 2 weeks  EXAM: CHEST  2 VIEW  COMPARISON:  09/07/2013  FINDINGS: The heart size and mediastinal contours are within normal limits. Both lungs are clear. The visualized skeletal structures are unremarkable.  IMPRESSION: No active cardiopulmonary disease.   Electronically Signed   By: Kathreen Devoid   On: 04/23/2014 14:09   Ct Angio Chest Aortic Dissect W &/or W/o  04/25/2014   CLINICAL DATA:  Chest tightness and center to left chest pain radiating to back x 2 weeks, neg cardiac cath,  EXAM: CT ANGIOGRAPHY CHEST, ABDOMEN AND PELVIS  TECHNIQUE: Multidetector CT imaging through the chest, abdomen and pelvis was performed using the standard protocol during bolus administration of intravenous contrast. Multiplanar reconstructed images and MIPs were obtained and reviewed to evaluate the vascular anatomy.  CONTRAST:  12mL OMNIPAQUE IOHEXOL 350 MG/ML SOLN  COMPARISON:  04/18/2009  FINDINGS: CTA CHEST FINDINGS  4.2 cm inferior left thyroid mass with mild tracheal displacement but no narrowing.The SVC is patent. RV/LV ratio less than 1, normal. Satisfactory opacification of pulmonary arteries noted, and there is no evidence of pulmonary emboli. Patent superior and inferior pulmonary veins. Mild left atrial enlargement. Patchy coronary calcifications. Ectatic ascending aorta, 4.2 cm maximum transverse diameter. Classic 3 vessel brachiocephalic arterial origin anatomy without proximal stenosis. No dissection or stenosis.  No pleural or pericardial effusion. No hilar or mediastinal adenopathy. 6 mm subpleural nodule in the lateral basal segment left lower lobe stable since 2011 characterized benign. Lungs otherwise clear. Spurring in the mid and lower thoracic spine. Sternum intact.  Review of the MIP images confirms the above findings.  CTA ABDOMEN AND PELVIS  Arterial findings:  Aorta: Mild eccentric  nonocclusive plaque in the infrarenal segment. No aneurysm, dissection, or stenosis.  Celiac axis:         Patent  Superior mesenteric: Patent, classic distal branch anatomy.  Left renal: Duplicated. The superior is diminutive, supplying only a portion of the upper pole. The inferior is dominant, widely patent.  Right renal: Duplicated. The superior is diminutive, supplying only a portion of the upper pole. The inferior is dominant, widely patent.  Inferior mesenteric: Short segment origin stenosis related to aortic wall plaque, patent distally.  Left iliac: Mild eccentric nonocclusive plaque in the common iliac artery. No aneurysm, dissection, or stenosis.  Right iliac:  Minimal common iliac plaque. No stenosis, dissection, or aneurysm.  Venous findings: Dedicated venous phase imaging not obtained. Note made of patent renal veins, splenic vein, and portal vein.  Review of the MIP images confirms the above findings.  Nonvascular findings: Unremarkable arterial phase evaluation of liver. Surgical clips in the gallbladder fossa. Unremarkable spleen, adrenal glands, kidneys, pancreas. The stomach, small bowel, and colon are nondilated. Bilateral pelvic phleboliths. Urinary bladder incompletely distended. No ascites. No free air. Small umbilical hernia containing mesenteric fat and a small amount of fluid. No adenopathy. Degenerative disc disease L5-S1.  IMPRESSION: 1. Negative for aortic dissection, pulmonary embolism, or other acute vascular abnormality. 2. 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 Recommend annual imaging followup by CTA or MRA. This recommendation follows 2010 ACCF/AHA/AATS/ACR/ASA/SCA/SCAI/SIR/STS/SVM Guidelines for the Diagnosis and Management of Patients with Thoracic Aortic Disease. Circulation. 2010; 121: e266-e369 3. 4.2 cm left thyroid mass. Consider dedicated thyroid ultrasound and biopsy if appropriate. This follows ACR consensus guidelines: Managing Incidental Thyroid  Nodules Detected on Imaging: White Paper of the ACR Incidental Thyroid Findings Committee. J Am Coll Radiol 2015; 38:182-993. 4. Small umbilical hernia.   Electronically Signed   By: Arne Cleveland M.D.   On: 04/25/2014 13:49   Ct Angio Abd/pel W/ And/or W/o  04/25/2014   CLINICAL DATA:  Chest tightness and center to left chest pain radiating to back x 2 weeks, neg cardiac cath,  EXAM: CT ANGIOGRAPHY CHEST, ABDOMEN AND PELVIS  TECHNIQUE: Multidetector CT imaging through the chest, abdomen and pelvis was performed using the standard protocol during bolus administration of intravenous contrast. Multiplanar reconstructed images and MIPs were obtained and reviewed to evaluate the vascular anatomy.  CONTRAST:  137mL OMNIPAQUE IOHEXOL 350 MG/ML SOLN  COMPARISON:  04/18/2009  FINDINGS: CTA CHEST FINDINGS  4.2 cm inferior left thyroid mass with mild tracheal displacement but no narrowing.The SVC is patent. RV/LV ratio less than 1, normal. Satisfactory opacification of pulmonary arteries noted, and there is no evidence of pulmonary emboli. Patent superior and inferior pulmonary veins. Mild left atrial enlargement. Patchy coronary calcifications. Ectatic ascending aorta, 4.2 cm maximum transverse diameter. Classic 3 vessel brachiocephalic arterial origin anatomy without proximal stenosis. No dissection or stenosis.  No pleural or pericardial effusion. No hilar or mediastinal adenopathy. 6 mm subpleural nodule in the lateral basal segment left lower lobe stable since 2011 characterized benign. Lungs otherwise clear. Spurring in the mid and lower thoracic spine. Sternum intact.  Review of the MIP images confirms the above findings.  CTA ABDOMEN AND PELVIS  Arterial findings:  Aorta: Mild eccentric nonocclusive plaque in the infrarenal segment. No aneurysm, dissection, or stenosis.  Celiac axis:         Patent  Superior mesenteric: Patent, classic distal branch anatomy.  Left renal: Duplicated. The superior is diminutive,  supplying only a portion of the upper pole. The inferior is dominant, widely patent.  Right renal: Duplicated. The superior is diminutive, supplying only a portion of the upper pole. The inferior is dominant, widely patent.  Inferior mesenteric: Short segment origin stenosis related to aortic wall plaque, patent distally.  Left iliac: Mild eccentric nonocclusive plaque in the common iliac artery. No aneurysm, dissection, or stenosis.  Right iliac: Minimal common iliac plaque. No stenosis, dissection, or aneurysm.  Venous findings: Dedicated venous phase imaging not obtained. Note made of patent renal veins, splenic vein, and portal vein.  Review of the MIP images confirms the above findings.  Nonvascular findings: Unremarkable arterial phase evaluation of liver.  Surgical clips in the gallbladder fossa. Unremarkable spleen, adrenal glands, kidneys, pancreas. The stomach, small bowel, and colon are nondilated. Bilateral pelvic phleboliths. Urinary bladder incompletely distended. No ascites. No free air. Small umbilical hernia containing mesenteric fat and a small amount of fluid. No adenopathy. Degenerative disc disease L5-S1.  IMPRESSION: 1. Negative for aortic dissection, pulmonary embolism, or other acute vascular abnormality. 2. 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 Recommend annual imaging followup by CTA or MRA. This recommendation follows 2010 ACCF/AHA/AATS/ACR/ASA/SCA/SCAI/SIR/STS/SVM Guidelines for the Diagnosis and Management of Patients with Thoracic Aortic Disease. Circulation. 2010; 121: e266-e369 3. 4.2 cm left thyroid mass. Consider dedicated thyroid ultrasound and biopsy if appropriate. This follows ACR consensus guidelines: Managing Incidental Thyroid Nodules Detected on Imaging: White Paper of the ACR Incidental Thyroid Findings Committee. J Am Coll Radiol 2015; 11:021-117. 4. Small umbilical hernia.   Electronically Signed   By: Arne Cleveland M.D.   On: 04/25/2014 13:49     Microbiology: No results found for this or any previous visit (from the past 240 hour(s)).   Labs: Basic Metabolic Panel:  Recent Labs Lab 04/23/14 1308 04/24/14 0851 04/25/14 0655  NA 142  --  141  K 4.0 3.5 3.0*  CL 104  --  105  CO2 27  --  27  GLUCOSE 209*  --  162*  BUN 15  --  14  CREATININE 0.83  --  0.81  CALCIUM 9.2  --  8.5   Liver Function Tests: No results for input(s): AST, ALT, ALKPHOS, BILITOT, PROT, ALBUMIN in the last 168 hours. No results for input(s): LIPASE, AMYLASE in the last 168 hours. No results for input(s): AMMONIA in the last 168 hours. CBC:  Recent Labs Lab 04/23/14 1308 04/25/14 0655  WBC 7.1 6.1  HGB 12.9 12.2  HCT 39.5 38.1  MCV 90.8 90.5  PLT 268 249   Cardiac Enzymes:  Recent Labs Lab 04/23/14 2151 04/24/14 0300 04/24/14 0851  TROPONINI <0.03 <0.03 <0.03   BNP: BNP (last 3 results) No results for input(s): PROBNP in the last 8760 hours. CBG:  Recent Labs Lab 04/24/14 1114 04/24/14 1649 04/24/14 2121 04/25/14 0606 04/25/14 1127  GLUCAP 149* 149* 172* 149* 188*       Signed:  Erminia Mcnew  [facility] 04/25/2014, 2:27 PM

## 2014-04-25 NOTE — Progress Notes (Signed)
UR completed 

## 2014-04-25 NOTE — Progress Notes (Signed)
Subjective:  No further CP.   Objective:  Temp:  [97.7 F (36.5 C)-99 F (37.2 C)] 98.7 F (37.1 C) (01/28 0440) Pulse Rate:  [61-66] 65 (01/28 0440) Resp:  [16-19] 16 (01/28 0440) BP: (103-149)/(41-91) 124/63 mmHg (01/28 0440) SpO2:  [97 %-99 %] 99 % (01/28 0440) Weight change:   Intake/Output from previous day:    Intake/Output from this shift:    Physical Exam: General appearance: alert and no distress Neck: no adenopathy, no carotid bruit, no JVD, supple, symmetrical, trachea midline and thyroid not enlarged, symmetric, no tenderness/mass/nodules Lungs: clear to auscultation bilaterally Heart: regular rate and rhythm, S1, S2 normal, no murmur, click, rub or gallop Extremities: extremities normal, atraumatic, no cyanosis or edema  Lab Results: Results for orders placed or performed during the hospital encounter of 04/23/14 (from the past 48 hour(s))  CBC     Status: None   Collection Time: 04/23/14  1:08 PM  Result Value Ref Range   WBC 7.1 4.0 - 10.5 K/uL   RBC 4.35 3.87 - 5.11 MIL/uL   Hemoglobin 12.9 12.0 - 15.0 g/dL   HCT 39.5 36.0 - 46.0 %   MCV 90.8 78.0 - 100.0 fL   MCH 29.7 26.0 - 34.0 pg   MCHC 32.7 30.0 - 36.0 g/dL   RDW 13.1 11.5 - 15.5 %   Platelets 268 150 - 400 K/uL  Basic metabolic panel     Status: Abnormal   Collection Time: 04/23/14  1:08 PM  Result Value Ref Range   Sodium 142 135 - 145 mmol/L   Potassium 4.0 3.5 - 5.1 mmol/L   Chloride 104 96 - 112 mmol/L   CO2 27 19 - 32 mmol/L   Glucose, Bld 209 (H) 70 - 99 mg/dL   BUN 15 6 - 23 mg/dL   Creatinine, Ser 0.83 0.50 - 1.10 mg/dL   Calcium 9.2 8.4 - 10.5 mg/dL   GFR calc non Af Amer 80 (L) >90 mL/min   GFR calc Af Amer >90 >90 mL/min    Comment: (NOTE) The eGFR has been calculated using the CKD EPI equation. This calculation has not been validated in all clinical situations. eGFR's persistently <90 mL/min signify possible Chronic Kidney Disease.    Anion gap 11 5 - 15  BNP  (order ONLY if patient complains of dyspnea/SOB AND you have documented it for THIS visit)     Status: Abnormal   Collection Time: 04/23/14  1:08 PM  Result Value Ref Range   B Natriuretic Peptide 139.2 (H) 0.0 - 100.0 pg/mL  I-stat troponin, ED (not at [facility])     Status: None   Collection Time: 04/23/14  1:31 PM  Result Value Ref Range   Troponin i, poc 0.00 0.00 - 0.08 ng/mL   Comment 3            Comment: Due to the release kinetics of cTnI, a negative result within the first hours of the onset of symptoms does not rule out myocardial infarction with certainty. If myocardial infarction is still suspected, repeat the test at appropriate intervals.   Hemoglobin A1c     Status: Abnormal   Collection Time: 04/23/14  9:51 PM  Result Value Ref Range   Hgb A1c MFr Bld 7.4 (H) <5.7 %    Comment: (NOTE)  According to the ADA Clinical Practice Recommendations for 2011, when HbA1c is used as a screening test:  >=6.5%   Diagnostic of Diabetes Mellitus           (if abnormal result is confirmed) 5.7-6.4%   Increased risk of developing Diabetes Mellitus References:Diagnosis and Classification of Diabetes Mellitus,Diabetes EPPI,9518,84(ZYSAY 1):S62-S69 and Standards of Medical Care in         Diabetes - 2011,Diabetes TKZS,0109,32 (Suppl 1):S11-S61.    Mean Plasma Glucose 166 (H) <117 mg/dL    Comment: Performed at [facility]  Troponin I-serum (one time only)     Status: None   Collection Time: 04/23/14  9:51 PM  Result Value Ref Range   Troponin I <0.03 <0.031 ng/mL    Comment:        NO INDICATION OF MYOCARDIAL INJURY.   Glucose, capillary     Status: Abnormal   Collection Time: 04/23/14 10:16 PM  Result Value Ref Range   Glucose-Capillary 184 (H) 70 - 99 mg/dL  Troponin I-serum (one time only)     Status: None   Collection Time: 04/24/14  3:00 AM  Result Value Ref Range   Troponin I <0.03 <0.031 ng/mL      Comment:        NO INDICATION OF MYOCARDIAL INJURY.   Glucose, capillary     Status: Abnormal   Collection Time: 04/24/14  6:01 AM  Result Value Ref Range   Glucose-Capillary 160 (H) 70 - 99 mg/dL   Comment 1 Documented in Chart    Comment 2 Notify RN   Troponin I-serum (one time only)     Status: None   Collection Time: 04/24/14  8:51 AM  Result Value Ref Range   Troponin I <0.03 <0.031 ng/mL    Comment:        NO INDICATION OF MYOCARDIAL INJURY.   Potassium     Status: None   Collection Time: 04/24/14  8:51 AM  Result Value Ref Range   Potassium 3.5 3.5 - 5.1 mmol/L  Glucose, capillary     Status: Abnormal   Collection Time: 04/24/14 11:14 AM  Result Value Ref Range   Glucose-Capillary 149 (H) 70 - 99 mg/dL  Protime-INR     Status: None   Collection Time: 04/24/14  1:52 PM  Result Value Ref Range   Prothrombin Time 13.6 11.6 - 15.2 seconds   INR 1.03 0.00 - 1.49  Glucose, capillary     Status: Abnormal   Collection Time: 04/24/14  4:49 PM  Result Value Ref Range   Glucose-Capillary 149 (H) 70 - 99 mg/dL  Glucose, capillary     Status: Abnormal   Collection Time: 04/24/14  9:21 PM  Result Value Ref Range   Glucose-Capillary 172 (H) 70 - 99 mg/dL   Comment 1 Documented in Chart    Comment 2 Notify RN   Glucose, capillary     Status: Abnormal   Collection Time: 04/25/14  6:06 AM  Result Value Ref Range   Glucose-Capillary 149 (H) 70 - 99 mg/dL   Comment 1 Documented in Chart    Comment 2 Notify RN   CBC     Status: None   Collection Time: 04/25/14  6:55 AM  Result Value Ref Range   WBC 6.1 4.0 - 10.5 K/uL   RBC 4.21 3.87 - 5.11 MIL/uL   Hemoglobin 12.2 12.0 - 15.0 g/dL   HCT 38.1 36.0 - 46.0 %   MCV 90.5 78.0 - 100.0  fL   MCH 29.0 26.0 - 34.0 pg   MCHC 32.0 30.0 - 36.0 g/dL   RDW 13.2 11.5 - 15.5 %   Platelets 249 150 - 400 K/uL  Basic metabolic panel     Status: Abnormal   Collection Time: 04/25/14  6:55 AM  Result Value Ref Range   Sodium 141 135 -  145 mmol/L   Potassium 3.0 (L) 3.5 - 5.1 mmol/L   Chloride 105 96 - 112 mmol/L   CO2 27 19 - 32 mmol/L   Glucose, Bld 162 (H) 70 - 99 mg/dL   BUN 14 6 - 23 mg/dL   Creatinine, Ser 0.81 0.50 - 1.10 mg/dL   Calcium 8.5 8.4 - 10.5 mg/dL   GFR calc non Af Amer 82 (L) >90 mL/min   GFR calc Af Amer >90 >90 mL/min    Comment: (NOTE) The eGFR has been calculated using the CKD EPI equation. This calculation has not been validated in all clinical situations. eGFR's persistently <90 mL/min signify possible Chronic Kidney Disease.    Anion gap 9 5 - 15    Imaging: Imaging results have been reviewed  Assessment/Plan:   1. Principal Problem: 2.   Chest pain 3. Active Problems: 4.   Type 2 diabetes mellitus 5.   Hypertension 6.   Hypercholesteremia 7.   Anginal pain 8.   Time Spent Directly with Patient:  20 minutes  Length of Stay:  LOS: 2 days   Results of cath noted. Essentially nl cors/ LV fxn. Enz neg. KG w/o acute changes. Awaiting CTA chest to measure Thoracic Ao. Home after that. From cards perspective. ROV with Dr. Inda Coke.  Lorretta Harp 04/25/2014, 10:10 AM

## 2014-04-25 NOTE — Progress Notes (Signed)
  Echocardiogram 2D Echocardiogram has been performed.  [name] 04/25/2014, 9:43 AM

## 2014-04-30 ENCOUNTER — Other Ambulatory Visit: Payer: Self-pay | Admitting: Family Medicine

## 2014-04-30 DIAGNOSIS — E079 Disorder of thyroid, unspecified: Secondary | ICD-10-CM

## 2014-05-01 ENCOUNTER — Other Ambulatory Visit: Payer: Self-pay

## 2014-05-01 ENCOUNTER — Ambulatory Visit
Admission: RE | Admit: 2014-05-01 | Discharge: 2014-05-01 | Disposition: A | Discharge status: Home / Self Care | Payer: BC Managed Care – PPO | Source: Ambulatory Visit | Attending: Family Medicine | Admitting: Family Medicine

## 2014-05-01 DIAGNOSIS — E079 Disorder of thyroid, unspecified: Secondary | ICD-10-CM

## 2014-05-08 ENCOUNTER — Other Ambulatory Visit: Payer: Self-pay | Admitting: Family Medicine

## 2014-05-08 DIAGNOSIS — E079 Disorder of thyroid, unspecified: Secondary | ICD-10-CM

## 2014-05-08 DIAGNOSIS — E041 Nontoxic single thyroid nodule: Secondary | ICD-10-CM

## 2014-05-12 ENCOUNTER — Emergency Department (HOSPITAL_COMMUNITY)
Admission: EM | Admit: 2014-05-12 | Discharge: 2014-05-12 | Disposition: A | Discharge status: Home / Self Care | Payer: BC Managed Care – PPO | Attending: Emergency Medicine | Admitting: Emergency Medicine

## 2014-05-12 ENCOUNTER — Encounter (HOSPITAL_COMMUNITY): Payer: Self-pay | Admitting: Emergency Medicine

## 2014-05-12 DIAGNOSIS — M1990 Unspecified osteoarthritis, unspecified site: Secondary | ICD-10-CM | POA: Insufficient documentation

## 2014-05-12 DIAGNOSIS — I10 Essential (primary) hypertension: Secondary | ICD-10-CM | POA: Diagnosis not present

## 2014-05-12 DIAGNOSIS — Z79899 Other long term (current) drug therapy: Secondary | ICD-10-CM | POA: Diagnosis not present

## 2014-05-12 DIAGNOSIS — N3090 Cystitis, unspecified without hematuria: Secondary | ICD-10-CM | POA: Diagnosis not present

## 2014-05-12 DIAGNOSIS — R000 Tachycardia, unspecified: Secondary | ICD-10-CM | POA: Diagnosis not present

## 2014-05-12 DIAGNOSIS — Z7982 Long term (current) use of aspirin: Secondary | ICD-10-CM | POA: Diagnosis not present

## 2014-05-12 DIAGNOSIS — E119 Type 2 diabetes mellitus without complications: Secondary | ICD-10-CM | POA: Insufficient documentation

## 2014-05-12 DIAGNOSIS — R339 Retention of urine, unspecified: Secondary | ICD-10-CM | POA: Diagnosis present

## 2014-05-12 DIAGNOSIS — E780 Pure hypercholesterolemia: Secondary | ICD-10-CM | POA: Diagnosis not present

## 2014-05-12 LAB — CBC WITH DIFFERENTIAL/PLATELET
Basophils Absolute: 0 10*3/uL (ref 0.0–0.1)
Basophils Relative: 0 % (ref 0–1)
EOS ABS: 0.3 10*3/uL (ref 0.0–0.7)
EOS PCT: 3 % (ref 0–5)
HCT: 41.6 % (ref 36.0–46.0)
Hemoglobin: 13.9 g/dL (ref 12.0–15.0)
Lymphocytes Relative: 32 % (ref 12–46)
Lymphs Abs: 2.7 10*3/uL (ref 0.7–4.0)
MCH: 30 pg (ref 26.0–34.0)
MCHC: 33.4 g/dL (ref 30.0–36.0)
MCV: 89.7 fL (ref 78.0–100.0)
Monocytes Absolute: 0.5 10*3/uL (ref 0.1–1.0)
Monocytes Relative: 5 % (ref 3–12)
NEUTROS PCT: 60 % (ref 43–77)
Neutro Abs: 5 10*3/uL (ref 1.7–7.7)
PLATELETS: 302 10*3/uL (ref 150–400)
RBC: 4.64 MIL/uL (ref 3.87–5.11)
RDW: 13.1 % (ref 11.5–15.5)
WBC: 8.4 10*3/uL (ref 4.0–10.5)

## 2014-05-12 LAB — URINE MICROSCOPIC-ADD ON

## 2014-05-12 LAB — COMPREHENSIVE METABOLIC PANEL
ALK PHOS: 81 U/L (ref 39–117)
ALT: 18 U/L (ref 0–35)
AST: 19 U/L (ref 0–37)
Albumin: 3.6 g/dL (ref 3.5–5.2)
Anion gap: 13 (ref 5–15)
BILIRUBIN TOTAL: 0.9 mg/dL (ref 0.3–1.2)
BUN: 12 mg/dL (ref 6–23)
CALCIUM: 9.1 mg/dL (ref 8.4–10.5)
CO2: 25 mmol/L (ref 19–32)
CREATININE: 0.96 mg/dL (ref 0.50–1.10)
Chloride: 102 mmol/L (ref 96–112)
GFR calc Af Amer: 77 mL/min — ABNORMAL LOW (ref >90)
GFR, EST NON AFRICAN AMERICAN: 67 mL/min — AB (ref >90)
GLUCOSE: 148 mg/dL — AB (ref 70–99)
POTASSIUM: 3.2 mmol/L — AB (ref 3.5–5.1)
SODIUM: 140 mmol/L (ref 135–145)
Total Protein: 7.5 g/dL (ref 6.0–8.3)

## 2014-05-12 LAB — URINALYSIS, ROUTINE W REFLEX MICROSCOPIC
BILIRUBIN URINE: NEGATIVE
Glucose, UA: NEGATIVE mg/dL
KETONES UR: NEGATIVE mg/dL
Nitrite: NEGATIVE
PROTEIN: 100 mg/dL — AB
SPECIFIC GRAVITY, URINE: 1.014 (ref 1.005–1.030)
Urobilinogen, UA: 0.2 mg/dL (ref 0.0–1.0)
pH: 6 (ref 5.0–8.0)

## 2014-05-12 MED ORDER — CIPROFLOXACIN HCL 500 MG PO TABS
500.0000 mg | ORAL_TABLET | Freq: Once | ORAL | Status: AC
  Administered 2014-05-12: 500 mg via ORAL
  Filled 2014-05-12: qty 1

## 2014-05-12 MED ORDER — METOCLOPRAMIDE HCL 10 MG PO TABS
10.0000 mg | ORAL_TABLET | Freq: Once | ORAL | Status: DC
  Filled 2014-05-12: qty 1

## 2014-05-12 MED ORDER — POTASSIUM CHLORIDE CRYS ER 20 MEQ PO TBCR
40.0000 meq | EXTENDED_RELEASE_TABLET | Freq: Once | ORAL | Status: AC
  Administered 2014-05-12: 40 meq via ORAL
  Filled 2014-05-12: qty 2

## 2014-05-12 MED ORDER — ONDANSETRON 4 MG PO TBDP
8.0000 mg | ORAL_TABLET | Freq: Once | ORAL | Status: AC
  Administered 2014-05-12: 8 mg via ORAL
  Filled 2014-05-12: qty 2

## 2014-05-12 MED ORDER — ACETAMINOPHEN 500 MG PO TABS
1000.0000 mg | ORAL_TABLET | Freq: Once | ORAL | Status: AC
  Administered 2014-05-12: 1000 mg via ORAL
  Filled 2014-05-12: qty 2

## 2014-05-12 MED ORDER — CIPROFLOXACIN HCL 500 MG PO TABS: 500.0000 mg | ORAL_TABLET | Freq: Two times a day (BID) | ORAL | Status: DC

## 2014-05-12 NOTE — ED Provider Notes (Addendum)
CSN: [id number]     Arrival date & time 05/12/14  1543 History   First MD Initiated Contact with Patient 05/12/14 1707     Chief Complaint  Patient presents with  . Back Pain  . Nausea  . Urinary Retention     (Consider location/radiation/quality/duration/timing/severity/associated sxs/prior Treatment) HPI Complains of bilateral paralumbar back pain nonradiating for the past 3 days. Symptoms accompanied by dysuria. She denies urinary retention. Company symptoms include nausea for 3 weeks. She reports that she was phoned in a prescription for an antiemetic by her primary care physician, Dr. Ayesha Rumpf however was not able to fill it due to insurance reasons. She denies fever no other associated symptoms. No treatment prior to coming here. Back pain is worse with moving or changing positions. Improved with remaining still. Past Medical History  Diagnosis Date  . Diabetes mellitus   . Hypertension   . Hypercholesteremia   . Heart murmur   . Family history of adverse reaction to anesthesia     difficult for father to wake after surgery  . Complication of anesthesia   . PONV (postoperative nausea and vomiting)   . 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 Colon surgery    . Abdominal hysterectomy    . Tonsillectomy    . Knee arthroscopy    . Cholecystectomy    . Left heart catheterization with coronary angiogram N/A 04/24/2014    Procedure: LEFT HEART CATHETERIZATION WITH CORONARY ANGIOGRAM;  Surgeon: Burnell Blanks, MD;  Location: [facility] CATH LAB;  Service: Cardiovascular;  Laterality: N/A;   Family History  Problem Relation Age of Onset  . Heart failure Mother   . Stroke Father   . Heart failure Father   . Diabetes Mother   . Cancer Father    History  Substance Use Topics  . Smoking status: Never Smoker   . Smokeless tobacco: Never Used  . Alcohol Use: No   OB History    No data available     Review of Systems   Constitutional: Negative.   HENT: Negative.   Respiratory: Negative.   Cardiovascular: Negative.   Gastrointestinal: Positive for nausea.  Genitourinary: Positive for dysuria.  Musculoskeletal: Positive for back pain.  Skin: Negative.   Neurological: Negative.   Psychiatric/Behavioral: Negative.   All other systems reviewed and are negative.     Allergies  Lorabid; Codeine; Sulfa drugs cross reactors; Benicar; and Other  Home Medications   Prior to Admission medications   Medication Sig Start Date End Date Taking? Authorizing Provider  acetaminophen (TYLENOL) 325 MG tablet Take 975 mg by mouth every 8 (eight) hours as needed for mild pain (pain).     Historical Provider, MD  albuterol (PROVENTIL HFA;VENTOLIN HFA) 108 (90 BASE) MCG/ACT inhaler Inhale 2 puffs into the lungs every 4 (four) hours as needed for wheezing or shortness of breath. 09/07/13   Leeanne Rio, MD  aspirin EC 325 MG tablet Take 325 mg by mouth daily.    Historical Provider, MD  atenolol (TENORMIN) 50 MG tablet Take 50 mg by mouth 2 (two) times daily.    Historical Provider, MD  B Complex Vitamins (VITAMIN B COMPLEX PO) Take 1 tablet by mouth daily.    Historical Provider, MD  furosemide (LASIX) 40 MG tablet Take 80 mg by mouth 2 (two) times daily.     Historical Provider, MD  Glucosamine-Chondroitin (MOVE FREE PO) Take 2 tablets  by mouth daily.    Historical Provider, MD  meloxicam (MOBIC) 15 MG tablet Take 15 mg by mouth daily.    Historical Provider, MD  metFORMIN (GLUCOPHAGE) 1000 MG tablet Take 1 tablet (1,000 mg total) by mouth 2 (two) times daily with a meal. 04/29/14   Maryann Mikhail, DO  Omega-3 Fatty Acids (FISH OIL PO) Take 1 capsule by mouth daily.    Historical Provider, MD  potassium chloride SA (K-DUR,KLOR-CON) 20 MEQ tablet Take 40 mEq by mouth 2 (two) times daily.     Historical Provider, MD  pravastatin (PRAVACHOL) 20 MG tablet Take 20 mg by mouth daily.  06/25/13   Historical Provider, MD   Probiotic Product (PROBIOTIC PO) Take 1 tablet by mouth daily.    Historical Provider, MD   BP 148/122 mmHg  Pulse 97  Temp(Src) 98.3 F (36.8 C) (Oral)  Resp 18  Ht 5\' 6"  (1.676 m)  Wt 246 lb (111.585 kg)  BMI 39.72 kg/m2  SpO2 96% Physical Exam  Constitutional: She appears well-developed and well-nourished.  HENT:  Head: Normocephalic and atraumatic.  Eyes: Conjunctivae are normal. Pupils are equal, round, and reactive to light.  Neck: Neck supple. No tracheal deviation present. No thyromegaly present.  Cardiovascular: Normal rate and regular rhythm.   No murmur heard. Pulmonary/Chest: Effort normal and breath sounds normal.  Abdominal: Soft. Bowel sounds are normal. She exhibits no distension. There is no tenderness.  Obese  Musculoskeletal: Normal range of motion. She exhibits no edema or tenderness.  Entire spine nontender  Neurological: She is alert. Coordination normal.  Skin: Skin is warm and dry. No rash noted.  Psychiatric: She has a normal mood and affect.  Nursing note and vitals reviewed.   ED Course  Procedures (including critical care time) Labs Review Labs Reviewed  URINALYSIS, ROUTINE W REFLEX MICROSCOPIC - Abnormal; Notable for the following:    APPearance CLOUDY (*)    Hgb urine dipstick TRACE (*)    Protein, ur 100 (*)    Leukocytes, UA MODERATE (*)    All other components within normal limits  URINE MICROSCOPIC-ADD ON - Abnormal; Notable for the following:    Squamous Epithelial / LPF FEW (*)    Bacteria, UA FEW (*)    All other components within normal limits  COMPREHENSIVE METABOLIC PANEL  CBC WITH DIFFERENTIAL/PLATELET    Imaging Review No results found.   EKG Interpretation None     7:20 PM nausea and pain are improved after treatment with Zofran and acetaminophen Results for orders placed or performed during the hospital encounter of 05/12/14  Comprehensive metabolic panel  Result Value Ref Range   Sodium 140 135 - 145 mmol/L    Potassium 3.2 (L) 3.5 - 5.1 mmol/L   Chloride 102 96 - 112 mmol/L   CO2 25 19 - 32 mmol/L   Glucose, Bld 148 (H) 70 - 99 mg/dL   BUN 12 6 - 23 mg/dL   Creatinine, Ser 0.96 0.50 - 1.10 mg/dL   Calcium 9.1 8.4 - 10.5 mg/dL   Total Protein 7.5 6.0 - 8.3 g/dL   Albumin 3.6 3.5 - 5.2 g/dL   AST 19 0 - 37 U/L   ALT 18 0 - 35 U/L   Alkaline Phosphatase 81 39 - 117 U/L   Total Bilirubin 0.9 0.3 - 1.2 mg/dL   GFR calc non Af Amer 67 (L) >90 mL/min   GFR calc Af Amer 77 (L) >90 mL/min   Anion gap 13 5 -  15  CBC with Differential  Result Value Ref Range   WBC 8.4 4.0 - 10.5 K/uL   RBC 4.64 3.87 - 5.11 MIL/uL   Hemoglobin 13.9 12.0 - 15.0 g/dL   HCT 41.6 36.0 - 46.0 %   MCV 89.7 78.0 - 100.0 fL   MCH 30.0 26.0 - 34.0 pg   MCHC 33.4 30.0 - 36.0 g/dL   RDW 13.1 11.5 - 15.5 %   Platelets 302 150 - 400 K/uL   Neutrophils Relative % 60 43 - 77 %   Neutro Abs 5.0 1.7 - 7.7 K/uL   Lymphocytes Relative 32 12 - 46 %   Lymphs Abs 2.7 0.7 - 4.0 K/uL   Monocytes Relative 5 3 - 12 %   Monocytes Absolute 0.5 0.1 - 1.0 K/uL   Eosinophils Relative 3 0 - 5 %   Eosinophils Absolute 0.3 0.0 - 0.7 K/uL   Basophils Relative 0 0 - 1 %   Basophils Absolute 0.0 0.0 - 0.1 K/uL  Urinalysis, Routine w reflex microscopic  Result Value Ref Range   Color, Urine YELLOW YELLOW   APPearance CLOUDY (A) CLEAR   Specific Gravity, Urine 1.014 1.005 - 1.030   pH 6.0 5.0 - 8.0   Glucose, UA NEGATIVE NEGATIVE mg/dL   Hgb urine dipstick TRACE (A) NEGATIVE   Bilirubin Urine NEGATIVE NEGATIVE   Ketones, ur NEGATIVE NEGATIVE mg/dL   Protein, ur 100 (A) NEGATIVE mg/dL   Urobilinogen, UA 0.2 0.0 - 1.0 mg/dL   Nitrite NEGATIVE NEGATIVE   Leukocytes, UA MODERATE (A) NEGATIVE  Urine microscopic-add on  Result Value Ref Range   Squamous Epithelial / LPF FEW (A) RARE   WBC, UA 7-10 <3 WBC/hpf   RBC / HPF 0-2 <3 RBC/hpf   Bacteria, UA FEW (A) RARE   Dg Chest 2 View  04/23/2014   CLINICAL DATA:  Mid chest pain, shortness  of breath for 2 weeks  EXAM: CHEST  2 VIEW  COMPARISON:  09/07/2013  FINDINGS: The heart size and mediastinal contours are within normal limits. Both lungs are clear. The visualized skeletal structures are unremarkable.  IMPRESSION: No active cardiopulmonary disease.   Electronically Signed   By: Kathreen Devoid   On: 04/23/2014 14:09   US Soft Tissue Head/neck  05/01/2014   CLINICAL DATA:  Thyroid disease.  EXAM: THYROID ULTRASOUND  TECHNIQUE: Ultrasound examination of the thyroid gland and adjacent soft tissues was performed.  COMPARISON:  None.  FINDINGS: Right thyroid lobe  Measurements: 4.3 x 1.0 x 1.8 cm. Small hypoechoic solid nodule measures approximately 0.7 x 0.5 x 0.9 cm. Area of shadowing dystrophic calcification present measuring roughly 10 mm.  Left thyroid lobe  Measurements: 6.0 x 3.1 x 3.7 cm. The left lobe is nodular in appearance. A single dominant nodular area was measured by the technologist as measuring 5.5 x 3.1 x 4.5 cm. Based on ultrasound, it actually appears that there are likely 2 adjacent similar size nodules each measuring roughly 2.8 cm in greatest diameter. These are solid with small internal cystic areas. No calcifications are seen.  Isthmus  Thickness: 0.5 cm.  No nodules visualized.  Lymphadenopathy  None visualized.  IMPRESSION: Nodular goiter affecting primarily the left lobe which is enlarged and occupied by 2 adjacent nodular areas, each measuring roughly 2.8 cm in greatest diameter. Appearance is consistent with goiter. Based on size criteria, biopsy of the dominant nodular areas in the left lobe could be considered.  Findings meet consensus criteria for biopsy.  Ultrasound-guided fine needle aspiration should be considered, as per the consensus statement: Management of Thyroid Nodules Detected at [place]: Society of Radiologists in [place]. Radiology 2005; [id number].   Electronically Signed   By: Aletta Edouard M.D.   On: 05/01/2014 16:41   Ct  Angio Chest Aortic Dissect W &/or W/o  04/25/2014   CLINICAL DATA:  Chest tightness and center to left chest pain radiating to back x 2 weeks, neg cardiac cath,  EXAM: CT ANGIOGRAPHY CHEST, ABDOMEN AND PELVIS  TECHNIQUE: Multidetector CT imaging through the chest, abdomen and pelvis was performed using the standard protocol during bolus administration of intravenous contrast. Multiplanar reconstructed images and MIPs were obtained and reviewed to evaluate the vascular anatomy.  CONTRAST:  162mL OMNIPAQUE IOHEXOL 350 MG/ML SOLN  COMPARISON:  04/18/2009  FINDINGS: CTA CHEST FINDINGS  4.2 cm inferior left thyroid mass with mild tracheal displacement but no narrowing.The SVC is patent. RV/LV ratio less than 1, normal. Satisfactory opacification of pulmonary arteries noted, and there is no evidence of pulmonary emboli. Patent superior and inferior pulmonary veins. Mild left atrial enlargement. Patchy coronary calcifications. Ectatic ascending aorta, 4.2 cm maximum transverse diameter. Classic 3 vessel brachiocephalic arterial origin anatomy without proximal stenosis. No dissection or stenosis.  No pleural or pericardial effusion. No hilar or mediastinal adenopathy. 6 mm subpleural nodule in the lateral basal segment left lower lobe stable since 2011 characterized benign. Lungs otherwise clear. Spurring in the mid and lower thoracic spine. Sternum intact.  Review of the MIP images confirms the above findings.  CTA ABDOMEN AND PELVIS  Arterial findings:  Aorta: Mild eccentric nonocclusive plaque in the infrarenal segment. No aneurysm, dissection, or stenosis.  Celiac axis:         Patent  Superior mesenteric: Patent, classic distal branch anatomy.  Left renal: Duplicated. The superior is diminutive, supplying only a portion of the upper pole. The inferior is dominant, widely patent.  Right renal: Duplicated. The superior is diminutive, supplying only a portion of the upper pole. The inferior is dominant, widely patent.   Inferior mesenteric: Short segment origin stenosis related to aortic wall plaque, patent distally.  Left iliac: Mild eccentric nonocclusive plaque in the common iliac artery. No aneurysm, dissection, or stenosis.  Right iliac: Minimal common iliac plaque. No stenosis, dissection, or aneurysm.  Venous findings: Dedicated venous phase imaging not obtained. Note made of patent renal veins, splenic vein, and portal vein.  Review of the MIP images confirms the above findings.  Nonvascular findings: Unremarkable arterial phase evaluation of liver. Surgical clips in the gallbladder fossa. Unremarkable spleen, adrenal glands, kidneys, pancreas. The stomach, small bowel, and colon are nondilated. Bilateral pelvic phleboliths. Urinary bladder incompletely distended. No ascites. No free air. Small umbilical hernia containing mesenteric fat and a small amount of fluid. No adenopathy. Degenerative disc disease L5-S1.  IMPRESSION: 1. Negative for aortic dissection, pulmonary embolism, or other acute vascular abnormality. 2. 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 Recommend annual imaging followup by CTA or MRA. This recommendation follows 2010 ACCF/AHA/AATS/ACR/ASA/SCA/SCAI/SIR/STS/SVM Guidelines for the Diagnosis and Management of Patients with Thoracic Aortic Disease. Circulation. 2010; 121: e266-e369 3. 4.2 cm left thyroid mass. Consider dedicated thyroid ultrasound and biopsy if appropriate. This follows ACR consensus guidelines: Managing Incidental Thyroid Nodules Detected on Imaging: White Paper of the ACR Incidental Thyroid Findings Committee. J Am Coll Radiol 2015; 84:132-440. 4. Small umbilical hernia.   Electronically Signed   By: Arne Cleveland M.D.   On: 04/25/2014 13:49  Ct Angio Abd/pel W/ And/or W/o  04/25/2014   CLINICAL DATA:  Chest tightness and center to left chest pain radiating to back x 2 weeks, neg cardiac cath,  EXAM: CT ANGIOGRAPHY CHEST, ABDOMEN AND PELVIS  TECHNIQUE:  Multidetector CT imaging through the chest, abdomen and pelvis was performed using the standard protocol during bolus administration of intravenous contrast. Multiplanar reconstructed images and MIPs were obtained and reviewed to evaluate the vascular anatomy.  CONTRAST:  143mL OMNIPAQUE IOHEXOL 350 MG/ML SOLN  COMPARISON:  04/18/2009  FINDINGS: CTA CHEST FINDINGS  4.2 cm inferior left thyroid mass with mild tracheal displacement but no narrowing.The SVC is patent. RV/LV ratio less than 1, normal. Satisfactory opacification of pulmonary arteries noted, and there is no evidence of pulmonary emboli. Patent superior and inferior pulmonary veins. Mild left atrial enlargement. Patchy coronary calcifications. Ectatic ascending aorta, 4.2 cm maximum transverse diameter. Classic 3 vessel brachiocephalic arterial origin anatomy without proximal stenosis. No dissection or stenosis.  No pleural or pericardial effusion. No hilar or mediastinal adenopathy. 6 mm subpleural nodule in the lateral basal segment left lower lobe stable since 2011 characterized benign. Lungs otherwise clear. Spurring in the mid and lower thoracic spine. Sternum intact.  Review of the MIP images confirms the above findings.  CTA ABDOMEN AND PELVIS  Arterial findings:  Aorta: Mild eccentric nonocclusive plaque in the infrarenal segment. No aneurysm, dissection, or stenosis.  Celiac axis:         Patent  Superior mesenteric: Patent, classic distal branch anatomy.  Left renal: Duplicated. The superior is diminutive, supplying only a portion of the upper pole. The inferior is dominant, widely patent.  Right renal: Duplicated. The superior is diminutive, supplying only a portion of the upper pole. The inferior is dominant, widely patent.  Inferior mesenteric: Short segment origin stenosis related to aortic wall plaque, patent distally.  Left iliac: Mild eccentric nonocclusive plaque in the common iliac artery. No aneurysm, dissection, or stenosis.  Right  iliac: Minimal common iliac plaque. No stenosis, dissection, or aneurysm.  Venous findings: Dedicated venous phase imaging not obtained. Note made of patent renal veins, splenic vein, and portal vein.  Review of the MIP images confirms the above findings.  Nonvascular findings: Unremarkable arterial phase evaluation of liver. Surgical clips in the gallbladder fossa. Unremarkable spleen, adrenal glands, kidneys, pancreas. The stomach, small bowel, and colon are nondilated. Bilateral pelvic phleboliths. Urinary bladder incompletely distended. No ascites. No free air. Small umbilical hernia containing mesenteric fat and a small amount of fluid. No adenopathy. Degenerative disc disease L5-S1.  IMPRESSION: 1. Negative for aortic dissection, pulmonary embolism, or other acute vascular abnormality. 2. 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 Recommend annual imaging followup by CTA or MRA. This recommendation follows 2010 ACCF/AHA/AATS/ACR/ASA/SCA/SCAI/SIR/STS/SVM Guidelines for the Diagnosis and Management of Patients with Thoracic Aortic Disease. Circulation. 2010; 121: e266-e369 3. 4.2 cm left thyroid mass. Consider dedicated thyroid ultrasound and biopsy if appropriate. This follows ACR consensus guidelines: Managing Incidental Thyroid Nodules Detected on Imaging: White Paper of the ACR Incidental Thyroid Findings Committee. J Am Coll Radiol 2015; 10:175-102. 4. Small umbilical hernia.   Electronically Signed   By: Arne Cleveland M.D.   On: 04/25/2014 13:49   Results for orders placed or performed during the hospital encounter of 05/12/14  Comprehensive metabolic panel  Result Value Ref Range   Sodium 140 135 - 145 mmol/L   Potassium 3.2 (L) 3.5 - 5.1 mmol/L   Chloride 102 96 - 112 mmol/L  CO2 25 19 - 32 mmol/L   Glucose, Bld 148 (H) 70 - 99 mg/dL   BUN 12 6 - 23 mg/dL   Creatinine, Ser 0.96 0.50 - 1.10 mg/dL   Calcium 9.1 8.4 - 10.5 mg/dL   Total Protein 7.5 6.0 - 8.3 g/dL    Albumin 3.6 3.5 - 5.2 g/dL   AST 19 0 - 37 U/L   ALT 18 0 - 35 U/L   Alkaline Phosphatase 81 39 - 117 U/L   Total Bilirubin 0.9 0.3 - 1.2 mg/dL   GFR calc non Af Amer 67 (L) >90 mL/min   GFR calc Af Amer 77 (L) >90 mL/min   Anion gap 13 5 - 15  CBC with Differential  Result Value Ref Range   WBC 8.4 4.0 - 10.5 K/uL   RBC 4.64 3.87 - 5.11 MIL/uL   Hemoglobin 13.9 12.0 - 15.0 g/dL   HCT 41.6 36.0 - 46.0 %   MCV 89.7 78.0 - 100.0 fL   MCH 30.0 26.0 - 34.0 pg   MCHC 33.4 30.0 - 36.0 g/dL   RDW 13.1 11.5 - 15.5 %   Platelets 302 150 - 400 K/uL   Neutrophils Relative % 60 43 - 77 %   Neutro Abs 5.0 1.7 - 7.7 K/uL   Lymphocytes Relative 32 12 - 46 %   Lymphs Abs 2.7 0.7 - 4.0 K/uL   Monocytes Relative 5 3 - 12 %   Monocytes Absolute 0.5 0.1 - 1.0 K/uL   Eosinophils Relative 3 0 - 5 %   Eosinophils Absolute 0.3 0.0 - 0.7 K/uL   Basophils Relative 0 0 - 1 %   Basophils Absolute 0.0 0.0 - 0.1 K/uL  Urinalysis, Routine w reflex microscopic  Result Value Ref Range   Color, Urine YELLOW YELLOW   APPearance CLOUDY (A) CLEAR   Specific Gravity, Urine 1.014 1.005 - 1.030   pH 6.0 5.0 - 8.0   Glucose, UA NEGATIVE NEGATIVE mg/dL   Hgb urine dipstick TRACE (A) NEGATIVE   Bilirubin Urine NEGATIVE NEGATIVE   Ketones, ur NEGATIVE NEGATIVE mg/dL   Protein, ur 100 (A) NEGATIVE mg/dL   Urobilinogen, UA 0.2 0.0 - 1.0 mg/dL   Nitrite NEGATIVE NEGATIVE   Leukocytes, UA MODERATE (A) NEGATIVE  Urine microscopic-add on  Result Value Ref Range   Squamous Epithelial / LPF FEW (A) RARE   WBC, UA 7-10 <3 WBC/hpf   RBC / HPF 0-2 <3 RBC/hpf   Bacteria, UA FEW (A) RARE   Dg Chest 2 View  04/23/2014   CLINICAL DATA:  Mid chest pain, shortness of breath for 2 weeks  EXAM: CHEST  2 VIEW  COMPARISON:  09/07/2013  FINDINGS: The heart size and mediastinal contours are within normal limits. Both lungs are clear. The visualized skeletal structures are unremarkable.  IMPRESSION: No active cardiopulmonary  disease.   Electronically Signed   By: Kathreen Devoid   On: 04/23/2014 14:09   US Soft Tissue Head/neck  05/01/2014   CLINICAL DATA:  Thyroid disease.  EXAM: THYROID ULTRASOUND  TECHNIQUE: Ultrasound examination of the thyroid gland and adjacent soft tissues was performed.  COMPARISON:  None.  FINDINGS: Right thyroid lobe  Measurements: 4.3 x 1.0 x 1.8 cm. Small hypoechoic solid nodule measures approximately 0.7 x 0.5 x 0.9 cm. Area of shadowing dystrophic calcification present measuring roughly 10 mm.  Left thyroid lobe  Measurements: 6.0 x 3.1 x 3.7 cm. The left lobe is nodular in appearance. A single dominant  nodular area was measured by the technologist as measuring 5.5 x 3.1 x 4.5 cm. Based on ultrasound, it actually appears that there are likely 2 adjacent similar size nodules each measuring roughly 2.8 cm in greatest diameter. These are solid with small internal cystic areas. No calcifications are seen.  Isthmus  Thickness: 0.5 cm.  No nodules visualized.  Lymphadenopathy  None visualized.  IMPRESSION: Nodular goiter affecting primarily the left lobe which is enlarged and occupied by 2 adjacent nodular areas, each measuring roughly 2.8 cm in greatest diameter. Appearance is consistent with goiter. Based on size criteria, biopsy of the dominant nodular areas in the left lobe could be considered.  Findings meet consensus criteria for biopsy. Ultrasound-guided fine needle aspiration should be considered, as per the consensus statement: Management of Thyroid Nodules Detected at [place]: Society of Radiologists in [place]. Radiology 2005; [id number].   Electronically Signed   By: Aletta Edouard M.D.   On: 05/01/2014 16:41   Ct Angio Chest Aortic Dissect W &/or W/o  04/25/2014   CLINICAL DATA:  Chest tightness and center to left chest pain radiating to back x 2 weeks, neg cardiac cath,  EXAM: CT ANGIOGRAPHY CHEST, ABDOMEN AND PELVIS  TECHNIQUE: Multidetector CT imaging through the  chest, abdomen and pelvis was performed using the standard protocol during bolus administration of intravenous contrast. Multiplanar reconstructed images and MIPs were obtained and reviewed to evaluate the vascular anatomy.  CONTRAST:  152mL OMNIPAQUE IOHEXOL 350 MG/ML SOLN  COMPARISON:  04/18/2009  FINDINGS: CTA CHEST FINDINGS  4.2 cm inferior left thyroid mass with mild tracheal displacement but no narrowing.The SVC is patent. RV/LV ratio less than 1, normal. Satisfactory opacification of pulmonary arteries noted, and there is no evidence of pulmonary emboli. Patent superior and inferior pulmonary veins. Mild left atrial enlargement. Patchy coronary calcifications. Ectatic ascending aorta, 4.2 cm maximum transverse diameter. Classic 3 vessel brachiocephalic arterial origin anatomy without proximal stenosis. No dissection or stenosis.  No pleural or pericardial effusion. No hilar or mediastinal adenopathy. 6 mm subpleural nodule in the lateral basal segment left lower lobe stable since 2011 characterized benign. Lungs otherwise clear. Spurring in the mid and lower thoracic spine. Sternum intact.  Review of the MIP images confirms the above findings.  CTA ABDOMEN AND PELVIS  Arterial findings:  Aorta: Mild eccentric nonocclusive plaque in the infrarenal segment. No aneurysm, dissection, or stenosis.  Celiac axis:         Patent  Superior mesenteric: Patent, classic distal branch anatomy.  Left renal: Duplicated. The superior is diminutive, supplying only a portion of the upper pole. The inferior is dominant, widely patent.  Right renal: Duplicated. The superior is diminutive, supplying only a portion of the upper pole. The inferior is dominant, widely patent.  Inferior mesenteric: Short segment origin stenosis related to aortic wall plaque, patent distally.  Left iliac: Mild eccentric nonocclusive plaque in the common iliac artery. No aneurysm, dissection, or stenosis.  Right iliac: Minimal common iliac plaque. No  stenosis, dissection, or aneurysm.  Venous findings: Dedicated venous phase imaging not obtained. Note made of patent renal veins, splenic vein, and portal vein.  Review of the MIP images confirms the above findings.  Nonvascular findings: Unremarkable arterial phase evaluation of liver. Surgical clips in the gallbladder fossa. Unremarkable spleen, adrenal glands, kidneys, pancreas. The stomach, small bowel, and colon are nondilated. Bilateral pelvic phleboliths. Urinary bladder incompletely distended. No ascites. No free air. Small umbilical hernia containing mesenteric fat and a small amount  of fluid. No adenopathy. Degenerative disc disease L5-S1.  IMPRESSION: 1. Negative for aortic dissection, pulmonary embolism, or other acute vascular abnormality. 2. 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 Recommend annual imaging followup by CTA or MRA. This recommendation follows 2010 ACCF/AHA/AATS/ACR/ASA/SCA/SCAI/SIR/STS/SVM Guidelines for the Diagnosis and Management of Patients with Thoracic Aortic Disease. Circulation. 2010; 121: e266-e369 3. 4.2 cm left thyroid mass. Consider dedicated thyroid ultrasound and biopsy if appropriate. This follows ACR consensus guidelines: Managing Incidental Thyroid Nodules Detected on Imaging: White Paper of the ACR Incidental Thyroid Findings Committee. J Am Coll Radiol 2015; 13:086-578. 4. Small umbilical hernia.   Electronically Signed   By: Arne Cleveland M.D.   On: 04/25/2014 13:49   Ct Angio Abd/pel W/ And/or W/o  04/25/2014   CLINICAL DATA:  Chest tightness and center to left chest pain radiating to back x 2 weeks, neg cardiac cath,  EXAM: CT ANGIOGRAPHY CHEST, ABDOMEN AND PELVIS  TECHNIQUE: Multidetector CT imaging through the chest, abdomen and pelvis was performed using the standard protocol during bolus administration of intravenous contrast. Multiplanar reconstructed images and MIPs were obtained and reviewed to evaluate the vascular anatomy.   CONTRAST:  153mL OMNIPAQUE IOHEXOL 350 MG/ML SOLN  COMPARISON:  04/18/2009  FINDINGS: CTA CHEST FINDINGS  4.2 cm inferior left thyroid mass with mild tracheal displacement but no narrowing.The SVC is patent. RV/LV ratio less than 1, normal. Satisfactory opacification of pulmonary arteries noted, and there is no evidence of pulmonary emboli. Patent superior and inferior pulmonary veins. Mild left atrial enlargement. Patchy coronary calcifications. Ectatic ascending aorta, 4.2 cm maximum transverse diameter. Classic 3 vessel brachiocephalic arterial origin anatomy without proximal stenosis. No dissection or stenosis.  No pleural or pericardial effusion. No hilar or mediastinal adenopathy. 6 mm subpleural nodule in the lateral basal segment left lower lobe stable since 2011 characterized benign. Lungs otherwise clear. Spurring in the mid and lower thoracic spine. Sternum intact.  Review of the MIP images confirms the above findings.  CTA ABDOMEN AND PELVIS  Arterial findings:  Aorta: Mild eccentric nonocclusive plaque in the infrarenal segment. No aneurysm, dissection, or stenosis.  Celiac axis:         Patent  Superior mesenteric: Patent, classic distal branch anatomy.  Left renal: Duplicated. The superior is diminutive, supplying only a portion of the upper pole. The inferior is dominant, widely patent.  Right renal: Duplicated. The superior is diminutive, supplying only a portion of the upper pole. The inferior is dominant, widely patent.  Inferior mesenteric: Short segment origin stenosis related to aortic wall plaque, patent distally.  Left iliac: Mild eccentric nonocclusive plaque in the common iliac artery. No aneurysm, dissection, or stenosis.  Right iliac: Minimal common iliac plaque. No stenosis, dissection, or aneurysm.  Venous findings: Dedicated venous phase imaging not obtained. Note made of patent renal veins, splenic vein, and portal vein.  Review of the MIP images confirms the above findings.   Nonvascular findings: Unremarkable arterial phase evaluation of liver. Surgical clips in the gallbladder fossa. Unremarkable spleen, adrenal glands, kidneys, pancreas. The stomach, small bowel, and colon are nondilated. Bilateral pelvic phleboliths. Urinary bladder incompletely distended. No ascites. No free air. Small umbilical hernia containing mesenteric fat and a small amount of fluid. No adenopathy. Degenerative disc disease L5-S1.  IMPRESSION: 1. Negative for aortic dissection, pulmonary embolism, or other acute vascular abnormality. 2. 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 Recommend annual imaging followup by CTA or MRA. This recommendation follows 2010 ACCF/AHA/AATS/ACR/ASA/SCA/SCAI/SIR/STS/SVM Guidelines for the Diagnosis  and Management of Patients with Thoracic Aortic Disease. Circulation. 2010; 121: [id number] 3. 4.2 cm left thyroid mass. Consider dedicated thyroid ultrasound and biopsy if appropriate. This follows ACR consensus guidelines: Managing Incidental Thyroid Nodules Detected on Imaging: White Paper of the ACR Incidental Thyroid Findings Committee. J Am Coll Radiol 2015; 48:185-631. 4. Small umbilical hernia.   Electronically Signed   By: Arne Cleveland M.D.   On: 04/25/2014 13:49     MDM  Pharmacy technician interviewed patient. She has prescription of Zofran waiting for her at [facility] prescription Cipro. Urine sent for culture, encourage oral hydration Patient encouraged to keep her appointment tomorrow with Dr. Ayesha Rumpf Diagnosis #1 urinary tract infection #2 hyperglycemia #3 hypokalemia Final diagnoses:  None        Orlie Dakin, MD 05/12/14 [place], MD 05/13/14 [id number]

## 2014-05-12 NOTE — ED Notes (Addendum)
Pt reports that she has had nausea since her heart cath aprox. 3 weeks ago.  Pt reports she has no vomiting but is only able to eat very little.  Pt also c/o middle back pain that began yesterday.  Pt reports some dysuria and frequency as well.

## 2014-05-12 NOTE — ED Notes (Signed)
Pt c/o nausea x 2 weeks. Pt has been seen by her PMD. Pt also reports back pain x 2 days and urinary retention today. Pt also having diarrhea. Pt last seen by PMD Wednesday. Pt has decrease PO intake.

## 2014-05-12 NOTE — Discharge Instructions (Signed)
Urinary Tract Infection Blood sugar today was 148, slightly high. Blood potassium was 3.2, slightly low. Make sure that you drink at least six 8 ounce glasses of water daily. Take the antibiotic prescribed starting tomorrow morning. Antinausea medicine is waiting for you at your pharmacy. Keep your scheduled appointment with Dr.Reese tomorrow afternoon. Tell Dr. Ayesha Rumpf that you were seen here today A urinary tract infection (UTI) can occur any place along the urinary tract. The tract includes the kidneys, ureters, bladder, and urethra. A type of germ called bacteria often causes a UTI. UTIs are often helped with antibiotic medicine.  HOME CARE   If given, take antibiotics as told by your doctor. Finish them even if you start to feel better.  Drink enough fluids to keep your pee (urine) clear or pale yellow.  Avoid tea, drinks with caffeine, and bubbly (carbonated) drinks.  Pee often. Avoid holding your pee in for a long time.  Pee before and after having sex (intercourse).  Wipe from front to back after you poop (bowel movement) if you are a woman. Use each tissue only once. GET HELP RIGHT AWAY IF:   You have back pain.  You have lower belly (abdominal) pain.  You have chills.  You feel sick to your stomach (nauseous).  You throw up (vomit).  Your burning or discomfort with peeing does not go away.  You have a fever.  Your symptoms are not better in 3 days. MAKE SURE YOU:   Understand these instructions.  Will watch your condition.  Will get help right away if you are not doing well or get worse. Document Released: 09/01/2007 Document Revised: 12/08/2011 Document Reviewed: 10/14/2011 [facility] Patient Information 2015 [place], [place]. This information is not intended to replace advice given to you by your health care provider. Make sure you discuss any questions you have with your health care provider.

## 2014-05-14 LAB — URINE CULTURE: Colony Count: >=100000

## 2014-05-15 ENCOUNTER — Telehealth (HOSPITAL_BASED_OUTPATIENT_CLINIC_OR_DEPARTMENT_OTHER): Payer: Self-pay | Admitting: Emergency Medicine

## 2014-05-15 NOTE — Telephone Encounter (Signed)
Post ED Visit - Positive Culture Follow-up  Culture report reviewed by antimicrobial stewardship pharmacist: []  Wes Bradley, Pharm.D., BCPS []  Heide Guile, Pharm.D., BCPS []  Alycia Rossetti, Pharm.D., BCPS []  [place], Pharm.D., BCPS, AAHIVP []  Legrand Como, Pharm.D., BCPS, AAHIVP [x]  Isac Sarna, Pharm.D., BCPS  Positive urine culture E. Coli Treated with ciprofloxacin, organism sensitive to the same and no further patient follow-up is required at this time.  Hazle Nordmann 05/15/2014, 11:29 AM

## 2014-05-21 ENCOUNTER — Other Ambulatory Visit (HOSPITAL_COMMUNITY)
Admission: RE | Admit: 2014-05-21 | Discharge: 2014-05-21 | Disposition: A | Discharge status: Home / Self Care | Payer: BC Managed Care – PPO | Source: Ambulatory Visit | Attending: Interventional Radiology | Admitting: Interventional Radiology

## 2014-05-21 ENCOUNTER — Ambulatory Visit
Admission: RE | Admit: 2014-05-21 | Discharge: 2014-05-21 | Disposition: A | Discharge status: Home / Self Care | Payer: BC Managed Care – PPO | Source: Ambulatory Visit | Attending: Family Medicine | Admitting: Family Medicine

## 2014-05-21 DIAGNOSIS — E041 Nontoxic single thyroid nodule: Secondary | ICD-10-CM | POA: Diagnosis present

## 2014-05-21 DIAGNOSIS — E079 Disorder of thyroid, unspecified: Secondary | ICD-10-CM

## 2014-08-30 ENCOUNTER — Ambulatory Visit: Payer: Self-pay | Admitting: Surgery

## 2014-09-16 NOTE — Patient Instructions (Signed)
[name]  09/16/2014   Your procedure is scheduled on:    09/20/2014    Report to [facility] Main  Entrance and follow signs to               [place] at     0730 AM.  Call this number if you have problems the morning of surgery [phone]   Remember: ONLY 1 PERSON MAY GO WITH YOU TO SHORT STAY TO GET  READY MORNING OF [place].  Do not eat food or drink liquids :After Midnight.   Eat a good healthy snack prior to bedtime.     Take these medicines the morning of surgery with A SIP OF WATER:    Albuterol Inhaler if needed and bring, Atenolol ( Tenormin)                                You may not have any metal on your body including hair pins and              piercings  Do not wear jewelry, make-up, lotions, powders or perfumes, deodorant             Do not wear nail polish.  Do not shave  48 hours prior to surgery.     Do not bring valuables to the hospital. [place].  Contacts, dentures or bridgework may not be worn into surgery.  Leave suitcase in the car. After surgery it may be brought to your room.       Special Instructions: coughing and deep breathing exercises, leg exercises               Please read over the following fact sheets you were given: _____________________________________________________________________             [facility] - Preparing for Surgery Before surgery, you can play an important role.  Because skin is not sterile, your skin needs to be as free of germs as possible.  You can reduce the number of germs on your skin by washing with CHG (chlorahexidine gluconate) soap before surgery.  CHG is an antiseptic cleaner which kills germs and bonds with the skin to continue killing germs even after washing. Please DO NOT use if you have an allergy to CHG or antibacterial soaps.  If your skin becomes reddened/irritated stop using the CHG and inform your nurse when you  arrive at Short Stay. Do not shave (including legs and underarms) for at least 48 hours prior to the first CHG shower.  You may shave your face/neck. Please follow these instructions carefully:  1.  Shower with CHG Soap the night before surgery and the  morning of Surgery.  2.  If you choose to wash your hair, wash your hair first as usual with your  normal  shampoo.  3.  After you shampoo, rinse your hair and body thoroughly to remove the  shampoo.                           4.  Use CHG as you would any other liquid soap.  You can apply chg directly  to the skin and wash  Gently with a scrungie or clean washcloth.  5.  Apply the CHG Soap to your body ONLY FROM THE NECK DOWN.   Do not use on face/ open                           Wound or open sores. Avoid contact with eyes, ears mouth and genitals (private parts).                       Wash face,  Genitals (private parts) with your normal soap.             6.  Wash thoroughly, paying special attention to the area where your surgery  will be performed.  7.  Thoroughly rinse your body with warm water from the neck down.  8.  DO NOT shower/wash with your normal soap after using and rinsing off  the CHG Soap.                9.  Pat yourself dry with a clean towel.            10.  Wear clean pajamas.            11.  Place clean sheets on your bed the night of your first shower and do not  sleep with pets. Day of Surgery : Do not apply any lotions/deodorants the morning of surgery.  Please wear clean clothes to the hospital/surgery center.  FAILURE TO FOLLOW THESE INSTRUCTIONS MAY RESULT IN THE CANCELLATION OF YOUR SURGERY PATIENT SIGNATURE_________________________________  NURSE SIGNATURE__________________________________  ________________________________________________________________________

## 2014-09-18 ENCOUNTER — Ambulatory Visit (HOSPITAL_COMMUNITY)
Admission: RE | Admit: 2014-09-18 | Discharge: 2014-09-18 | Disposition: A | Discharge status: Home / Self Care | Payer: BC Managed Care – PPO | Source: Ambulatory Visit | Attending: Anesthesiology | Admitting: Anesthesiology

## 2014-09-18 ENCOUNTER — Encounter (HOSPITAL_COMMUNITY): Payer: Self-pay

## 2014-09-18 ENCOUNTER — Encounter (HOSPITAL_COMMUNITY)
Admission: RE | Admit: 2014-09-18 | Discharge: 2014-09-18 | Disposition: A | Discharge status: Home / Self Care | Payer: BC Managed Care – PPO | Source: Ambulatory Visit | Attending: Surgery | Admitting: Surgery

## 2014-09-18 DIAGNOSIS — I10 Essential (primary) hypertension: Secondary | ICD-10-CM | POA: Insufficient documentation

## 2014-09-18 DIAGNOSIS — Z01818 Encounter for other preprocedural examination: Secondary | ICD-10-CM

## 2014-09-18 HISTORY — DX: Cardiac arrhythmia, unspecified: I49.9

## 2014-09-18 HISTORY — DX: Anemia, unspecified: D64.9

## 2014-09-18 HISTORY — DX: Unspecified asthma, uncomplicated: J45.909

## 2014-09-18 LAB — BASIC METABOLIC PANEL WITH GFR
Anion gap: 11 (ref 5–15)
BUN: 20 mg/dL (ref 6–20)
CO2: 25 mmol/L (ref 22–32)
Calcium: 8.7 mg/dL — ABNORMAL LOW (ref 8.9–10.3)
Chloride: 105 mmol/L (ref 101–111)
Creatinine, Ser: 0.93 mg/dL (ref 0.44–1.00)
GFR calc Af Amer: >60 mL/min
GFR calc non Af Amer: >60 mL/min
Glucose, Bld: 207 mg/dL — ABNORMAL HIGH (ref 65–99)
Potassium: 3.4 mmol/L — ABNORMAL LOW (ref 3.5–5.1)
Sodium: 141 mmol/L (ref 135–145)

## 2014-09-18 LAB — CBC
HCT: 41.6 % (ref 36.0–46.0)
Hemoglobin: 13.5 g/dL (ref 12.0–15.0)
MCH: 28.8 pg (ref 26.0–34.0)
MCHC: 32.5 g/dL (ref 30.0–36.0)
MCV: 88.7 fL (ref 78.0–100.0)
Platelets: 274 K/uL (ref 150–400)
RBC: 4.69 MIL/uL (ref 3.87–5.11)
RDW: 13.5 % (ref 11.5–15.5)
WBC: 8.8 K/uL (ref 4.0–10.5)

## 2014-09-18 NOTE — Progress Notes (Signed)
EKG- 04/24/2014 EPIC  ECHO- 04/25/2014 EPIC  CATH- 04/24/2014 EPIC CXR-04/23/14 EPIC  Labs on chart along with LOV- 08/02/2014  [facility]- Dr Reynold Bowen on chart

## 2014-09-18 NOTE — Progress Notes (Signed)
Quick Note:  These results are acceptable for scheduled surgery.  Bexton Haak M. Shagun Wordell, MD, FACS [facility] Office: [phone]   ______ 

## 2014-09-18 NOTE — Progress Notes (Signed)
Quick Note:  Pre-operative chest x-ray is acceptable for scheduled surgery.  Kendyl Bissonnette M. Charan Prieto, MD, FACS [facility] Office: [phone]   ______ 

## 2014-09-19 ENCOUNTER — Encounter (HOSPITAL_COMMUNITY): Payer: Self-pay | Admitting: Surgery

## 2014-09-19 DIAGNOSIS — D440 Neoplasm of uncertain behavior of thyroid gland: Secondary | ICD-10-CM | POA: Diagnosis present

## 2014-09-19 NOTE — H&P (Signed)
General Surgery [facility]  [name] DOB: 07/27/1960 Married / Language: English / Race: White Female  History of Present Illness  The patient is a 54 year old female who presents with a thyroid nodule. Patient is referred by Dr. Carrolyn Meiers for evaluation of thyroid neoplasm of uncertain behavior. Patient had been admitted to the hospital in January 2016 for shortness of breath. As part of her evaluation she underwent a CT scan which showed an incidental finding of a nodule in the thyroid. Patient subsequently underwent ultrasound of the thyroid followed by ultrasound-guided fine-needle aspiration biopsy on May 21, 2014. This showed a dominant mass in the left thyroid lobe measuring 5.5 cm. 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 Patient has had no prior history of thyroid disease. She has never been on thyroid medication. She has had no prior head or neck surgery. There is no family history of thyroid cancer. There is no family history of other endocrine neoplasms. Patient does describe frequent dysphagia. She describes globus sensation. She denies tremors or palpitations.   Other Problems Arthritis Diabetes Mellitus Heart murmur Hemorrhoids High blood pressure Hypercholesterolemia Oophorectomy Bilateral. Thyroid Disease  Past Surgical History Colon Polyp Removal - Colonoscopy Gallbladder Surgery - Open Hysterectomy (not due to cancer) - Complete Knee Surgery Left.  Diagnostic Studies History Colonoscopy >10 years ago Mammogram >3 years ago Pap Smear >5 years ago  Allergies Lorabid *ANTI-INFECTIVE AGENTS - MISC.* Codeine Phosphate *ANALGESICS - OPIOID* Sulfa Antibiotics Benicar *ANTIHYPERTENSIVES*  Medication History Atenolol (50MG  Tablet, Oral) Active. Diclofenac Sodium (3% Gel, Transdermal) Active. Furosemide (40MG  Tablet, Oral)  Active. Lisinopril (10MG  Tablet, Oral) Active. Meloxicam (15MG  Tablet, Oral) Active. MetFORMIN HCl (1000MG  Tablet, Oral) Active. Pravastatin Sodium (20MG  Tablet, Oral) Active. Potassium Chloride Crys ER (20MEQ Tablet ER, Oral) Active. Medications Reconciled  Social History Alcohol use Occasional alcohol use. Caffeine use Carbonated beverages, Coffee. No drug use Tobacco use Never smoker.  Family History Anesthetic complications Father. Arthritis Mother. Cerebrovascular Accident Father, Mother. Colon Cancer Father. Colon Polyps Father, Son. Diabetes Mellitus Mother. Heart Disease Mother. Heart disease in female family member before age 25 Hypertension Brother, Father, Mother, Sister.  Pregnancy / Birth History Age at menarche 7 years. Age of menopause <46 Contraceptive History Oral contraceptives. Gravida 2 Irregular periods Maternal age 43-25 Para 2  Review of Systems General Present- Appetite Loss, Chills and Fatigue. Not Present- Fever, Night Sweats, Weight Gain and Weight Loss. Skin Not Present- Change in Wart/Mole, Dryness, Hives, Jaundice, New Lesions, Non-Healing Wounds, Rash and Ulcer. HEENT Present- Earache, Ringing in the Ears and Sore Throat. Not Present- Hearing Loss, Hoarseness, Nose Bleed, Oral Ulcers, Seasonal Allergies, Sinus Pain, Visual Disturbances, Wears glasses/contact lenses and Yellow Eyes. Breast Not Present- Breast Mass, Breast Pain, Nipple Discharge and Skin Changes. Cardiovascular Present- Chest Pain, Difficulty Breathing Lying Down and Shortness of Breath. Not Present- Leg Cramps, Palpitations, Rapid Heart Rate and Swelling of Extremities. Gastrointestinal Present- Difficulty Swallowing, Nausea and Vomiting. Not Present- Abdominal Pain, Bloating, Bloody Stool, Change in Bowel Habits, Chronic diarrhea, Constipation, Excessive gas, Gets full quickly at meals, Hemorrhoids, Indigestion and Rectal Pain. Female Genitourinary Not  Present- Frequency, Nocturia, Painful Urination, Pelvic Pain and Urgency. Musculoskeletal Present- Muscle Pain. Not Present- Back Pain, Joint Pain, Joint Stiffness, Muscle Weakness and Swelling of Extremities. Neurological Present- Headaches. Not Present- Decreased Memory, Fainting, Numbness, Seizures, Tingling, Tremor, Trouble walking and Weakness. Psychiatric Present- Change in Sleep Pattern. Not Present- Anxiety, Bipolar, Depression, Fearful  and Frequent crying. Endocrine Present- Hot flashes. Not Present- Cold Intolerance, Excessive Hunger, Hair Changes, Heat Intolerance and New Diabetes. Hematology Not Present- Easy Bruising, Excessive bleeding, Gland problems, HIV and Persistent Infections.   Vitals Weight: 256.8 lb Height: 65in Body Surface Area: 2.31 m Body Mass Index: 42.73 kg/m Temp.: 86F(Temporal)  Pulse: 77 (Regular)  BP: 126/78 (Sitting, Left Arm, Standard)    Physical Exam  General - appears comfortable, no distress; not diaphorectic  HEENT - normocephalic; sclerae clear, gaze conjugate; mucous membranes moist, dentition fair; voice normal  Neck - symmetric on extension; no palpable anterior or posterior cervical adenopathy; right lobe is without palpable abnormality; left lobe shows a dominant mass extending beneath the left clavicle which is detected on swallowing and measures at least 4-5 cm in greatest dimension, smooth, nontender  Chest - clear bilaterally without rhonchi, rales, or wheeze  Cor - regular rhythm with normal rate; no significant murmur  Ext - non-tender with trace edema or lymphedema  Neuro - grossly intact; no tremor    Assessment & Plan  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 Risk of malignancy is approximately 25-40%. Patient presents today accompanied by her daughter. Written literature on  thyroid surgery is provided to the patient for review at home.  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 We have discussed the procedure, the risk and benefits, the alternatives, and the hospital stay to be anticipated. We have discussed the risk of recurrent laryngeal nerve injury and injury to parathyroid glands. We have discussed the location of the surgical incision. We have discussed the potential need for radioactive iodine treatment in the event of malignancy. We have discussed the need for lifelong thyroid hormone replacement. She understands and wishes to proceed with surgery in the near future.  The risks and benefits of the procedure have been discussed at length with the patient. The patient understands the proposed procedure, potential alternative treatments, and the course of recovery to be expected. All of the patient's questions have been answered at this time. The patient wishes to proceed with surgery.  Earnstine Regal, MD, [facility] Office: [phone]

## 2014-09-20 ENCOUNTER — Encounter (HOSPITAL_COMMUNITY)
Admission: RE | Disposition: A | Discharge status: Home / Self Care | Payer: Self-pay | Source: Ambulatory Visit | Attending: Surgery

## 2014-09-20 ENCOUNTER — Ambulatory Visit (HOSPITAL_COMMUNITY): Payer: BC Managed Care – PPO | Admitting: Registered Nurse

## 2014-09-20 ENCOUNTER — Encounter (HOSPITAL_COMMUNITY): Payer: Self-pay | Admitting: *Deleted

## 2014-09-20 ENCOUNTER — Observation Stay (HOSPITAL_COMMUNITY)
Admission: RE | Admit: 2014-09-20 | Discharge: 2014-09-21 | Disposition: A | Discharge status: Home / Self Care | Payer: BC Managed Care – PPO | Source: Ambulatory Visit | Attending: Surgery | Admitting: Surgery

## 2014-09-20 DIAGNOSIS — I209 Angina pectoris, unspecified: Secondary | ICD-10-CM | POA: Diagnosis not present

## 2014-09-20 DIAGNOSIS — D649 Anemia, unspecified: Secondary | ICD-10-CM | POA: Diagnosis not present

## 2014-09-20 DIAGNOSIS — D440 Neoplasm of uncertain behavior of thyroid gland: Secondary | ICD-10-CM | POA: Diagnosis present

## 2014-09-20 DIAGNOSIS — Z888 Allergy status to other drugs, medicaments and biological substances status: Secondary | ICD-10-CM | POA: Insufficient documentation

## 2014-09-20 DIAGNOSIS — Z6841 Body Mass Index (BMI) 40.0 and over, adult: Secondary | ICD-10-CM | POA: Diagnosis not present

## 2014-09-20 DIAGNOSIS — M1990 Unspecified osteoarthritis, unspecified site: Secondary | ICD-10-CM | POA: Diagnosis not present

## 2014-09-20 DIAGNOSIS — Z885 Allergy status to narcotic agent status: Secondary | ICD-10-CM | POA: Diagnosis not present

## 2014-09-20 DIAGNOSIS — E119 Type 2 diabetes mellitus without complications: Secondary | ICD-10-CM | POA: Diagnosis not present

## 2014-09-20 DIAGNOSIS — Z882 Allergy status to sulfonamides status: Secondary | ICD-10-CM | POA: Insufficient documentation

## 2014-09-20 DIAGNOSIS — I739 Peripheral vascular disease, unspecified: Secondary | ICD-10-CM | POA: Diagnosis not present

## 2014-09-20 DIAGNOSIS — C73 Malignant neoplasm of thyroid gland: Principal | ICD-10-CM | POA: Insufficient documentation

## 2014-09-20 DIAGNOSIS — I10 Essential (primary) hypertension: Secondary | ICD-10-CM | POA: Diagnosis not present

## 2014-09-20 DIAGNOSIS — J45909 Unspecified asthma, uncomplicated: Secondary | ICD-10-CM | POA: Diagnosis not present

## 2014-09-20 HISTORY — PX: THYROIDECTOMY: SHX17

## 2014-09-20 LAB — GLUCOSE, CAPILLARY
GLUCOSE-CAPILLARY: 293 mg/dL — AB (ref 65–99)
GLUCOSE-CAPILLARY: 312 mg/dL — AB (ref 65–99)
Glucose-Capillary: 185 mg/dL — ABNORMAL HIGH (ref 65–99)
Glucose-Capillary: 211 mg/dL — ABNORMAL HIGH (ref 65–99)
Glucose-Capillary: 300 mg/dL — ABNORMAL HIGH (ref 65–99)
Glucose-Capillary: 290 mg/dL — ABNORMAL HIGH (ref 65–99)
Glucose-Capillary: 239 mg/dL — ABNORMAL HIGH (ref 65–99)

## 2014-09-20 SURGERY — THYROIDECTOMY
Anesthesia: General | Site: Neck

## 2014-09-20 MED ORDER — CALCIUM CARBONATE 1250 (500 CA) MG PO TABS
2.0000 | ORAL_TABLET | Freq: Three times a day (TID) | ORAL | Status: DC
  Administered 2014-09-20 – 2014-09-21 (×2): 1000 mg via ORAL
  Filled 2014-09-20 (×5): qty 2

## 2014-09-20 MED ORDER — ROCURONIUM BROMIDE 100 MG/10ML IV SOLN
INTRAVENOUS | Status: DC | PRN
  Administered 2014-09-20 (×2): 10 mg via INTRAVENOUS
  Administered 2014-09-20: 30 mg via INTRAVENOUS

## 2014-09-20 MED ORDER — FENTANYL CITRATE (PF) 250 MCG/5ML IJ SOLN
INTRAMUSCULAR | Status: AC
  Filled 2014-09-20: qty 5

## 2014-09-20 MED ORDER — HYDROCODONE-ACETAMINOPHEN 5-325 MG PO TABS
1.0000 | ORAL_TABLET | ORAL | Status: DC | PRN
  Administered 2014-09-21: 2 via ORAL
  Filled 2014-09-20: qty 1
  Filled 2014-09-20: qty 2

## 2014-09-20 MED ORDER — DEXAMETHASONE SODIUM PHOSPHATE 10 MG/ML IJ SOLN
INTRAMUSCULAR | Status: AC
Start: 2014-09-20 — End: 2014-09-20
  Filled 2014-09-20: qty 1

## 2014-09-20 MED ORDER — ONDANSETRON HCL 4 MG/2ML IJ SOLN
INTRAMUSCULAR | Status: AC
Start: 2014-09-20 — End: 2014-09-20
  Filled 2014-09-20: qty 2

## 2014-09-20 MED ORDER — KCL IN DEXTROSE-NACL 20-5-0.45 MEQ/L-%-% IV SOLN
INTRAVENOUS | Status: DC
  Administered 2014-09-20: 15:00:00 via INTRAVENOUS
  Filled 2014-09-20 (×2): qty 1000

## 2014-09-20 MED ORDER — PROPOFOL 10 MG/ML IV BOLUS
INTRAVENOUS | Status: AC
  Filled 2014-09-20: qty 20

## 2014-09-20 MED ORDER — SUCCINYLCHOLINE CHLORIDE 20 MG/ML IJ SOLN
INTRAMUSCULAR | Status: DC | PRN
  Administered 2014-09-20: 100 mg via INTRAVENOUS

## 2014-09-20 MED ORDER — INSULIN ASPART 100 UNIT/ML ~~LOC~~ SOLN
5.0000 [IU] | Freq: Once | SUBCUTANEOUS | Status: AC
  Administered 2014-09-20: 5 [IU] via SUBCUTANEOUS

## 2014-09-20 MED ORDER — PROMETHAZINE HCL 25 MG/ML IJ SOLN
6.2500 mg | INTRAMUSCULAR | Status: DC | PRN
  Administered 2014-09-20: 6.25 mg via INTRAVENOUS

## 2014-09-20 MED ORDER — NEOSTIGMINE METHYLSULFATE 10 MG/10ML IV SOLN
INTRAVENOUS | Status: AC
  Filled 2014-09-20: qty 1

## 2014-09-20 MED ORDER — POTASSIUM CHLORIDE CRYS ER 20 MEQ PO TBCR
40.0000 meq | EXTENDED_RELEASE_TABLET | Freq: Two times a day (BID) | ORAL | Status: DC
  Administered 2014-09-20 – 2014-09-21 (×2): 40 meq via ORAL
  Filled 2014-09-20 (×3): qty 2

## 2014-09-20 MED ORDER — MIDAZOLAM HCL 5 MG/5ML IJ SOLN
INTRAMUSCULAR | Status: DC | PRN
  Administered 2014-09-20: 2 mg via INTRAVENOUS

## 2014-09-20 MED ORDER — NEOSTIGMINE METHYLSULFATE 10 MG/10ML IV SOLN
INTRAVENOUS | Status: DC | PRN
  Administered 2014-09-20: 5 mg via INTRAVENOUS

## 2014-09-20 MED ORDER — CALCIUM CARBONATE-VITAMIN D 500-200 MG-UNIT PO TABS: 1.0000 | ORAL_TABLET | Freq: Three times a day (TID) | ORAL | Status: DC

## 2014-09-20 MED ORDER — INSULIN ASPART 100 UNIT/ML ~~LOC~~ SOLN
0.0000 [IU] | Freq: Once | SUBCUTANEOUS | Status: AC
  Administered 2014-09-20: 11 [IU] via SUBCUTANEOUS

## 2014-09-20 MED ORDER — INSULIN ASPART 100 UNIT/ML ~~LOC~~ SOLN
0.0000 [IU] | Freq: Three times a day (TID) | SUBCUTANEOUS | Status: DC
  Administered 2014-09-21: 4 [IU] via SUBCUTANEOUS

## 2014-09-20 MED ORDER — FENTANYL CITRATE (PF) 100 MCG/2ML IJ SOLN
INTRAMUSCULAR | Status: DC | PRN
  Administered 2014-09-20 (×2): 50 ug via INTRAVENOUS
  Administered 2014-09-20: 100 ug via INTRAVENOUS
  Administered 2014-09-20 (×3): 50 ug via INTRAVENOUS

## 2014-09-20 MED ORDER — PROPOFOL 10 MG/ML IV BOLUS
INTRAVENOUS | Status: DC | PRN
  Administered 2014-09-20: 200 mg via INTRAVENOUS
  Administered 2014-09-20: 30 mg via INTRAVENOUS

## 2014-09-20 MED ORDER — METFORMIN HCL 500 MG PO TABS
1000.0000 mg | ORAL_TABLET | Freq: Two times a day (BID) | ORAL | Status: DC
  Administered 2014-09-20 – 2014-09-21 (×2): 1000 mg via ORAL
  Filled 2014-09-20 (×4): qty 2

## 2014-09-20 MED ORDER — INSULIN ASPART 100 UNIT/ML ~~LOC~~ SOLN
3.0000 [IU] | Freq: Once | SUBCUTANEOUS | Status: AC
  Administered 2014-09-20: 3 [IU] via SUBCUTANEOUS

## 2014-09-20 MED ORDER — FENTANYL CITRATE (PF) 100 MCG/2ML IJ SOLN
INTRAMUSCULAR | Status: AC
  Filled 2014-09-20: qty 2

## 2014-09-20 MED ORDER — CIPROFLOXACIN IN D5W 400 MG/200ML IV SOLN
400.0000 mg | INTRAVENOUS | Status: AC
  Administered 2014-09-20: 400 mg via INTRAVENOUS

## 2014-09-20 MED ORDER — GLYCOPYRROLATE 0.2 MG/ML IJ SOLN
INTRAMUSCULAR | Status: DC | PRN
  Administered 2014-09-20: .8 mg via INTRAVENOUS

## 2014-09-20 MED ORDER — SYNTHROID 100 MCG PO TABS: 100.0000 ug | ORAL_TABLET | Freq: Every day | ORAL | Status: DC

## 2014-09-20 MED ORDER — FUROSEMIDE 80 MG PO TABS
80.0000 mg | ORAL_TABLET | Freq: Two times a day (BID) | ORAL | Status: DC
  Administered 2014-09-20 – 2014-09-21 (×2): 80 mg via ORAL
  Filled 2014-09-20 (×4): qty 1

## 2014-09-20 MED ORDER — LIDOCAINE HCL (CARDIAC) 20 MG/ML IV SOLN
INTRAVENOUS | Status: DC | PRN
  Administered 2014-09-20: 100 mg via INTRAVENOUS

## 2014-09-20 MED ORDER — DEXAMETHASONE SODIUM PHOSPHATE 10 MG/ML IJ SOLN
INTRAMUSCULAR | Status: DC | PRN
  Administered 2014-09-20: 10 mg via INTRAVENOUS

## 2014-09-20 MED ORDER — ATENOLOL 50 MG PO TABS
50.0000 mg | ORAL_TABLET | Freq: Two times a day (BID) | ORAL | Status: DC
  Administered 2014-09-20 – 2014-09-21 (×2): 50 mg via ORAL
  Filled 2014-09-20 (×3): qty 1

## 2014-09-20 MED ORDER — LIDOCAINE HCL (CARDIAC) 20 MG/ML IV SOLN
INTRAVENOUS | Status: AC
  Filled 2014-09-20: qty 5

## 2014-09-20 MED ORDER — INSULIN ASPART 100 UNIT/ML ~~LOC~~ SOLN
SUBCUTANEOUS | Status: AC
  Filled 2014-09-20: qty 1

## 2014-09-20 MED ORDER — ACETAMINOPHEN 325 MG PO TABS: 650.0000 mg | ORAL_TABLET | ORAL | Status: DC | PRN

## 2014-09-20 MED ORDER — GLYCOPYRROLATE 0.2 MG/ML IJ SOLN
INTRAMUSCULAR | Status: AC
  Filled 2014-09-20: qty 4

## 2014-09-20 MED ORDER — ONDANSETRON HCL 4 MG/2ML IJ SOLN
4.0000 mg | Freq: Four times a day (QID) | INTRAMUSCULAR | Status: DC | PRN
Start: 2014-09-20 — End: 2014-09-21
  Administered 2014-09-20 (×2): 4 mg via INTRAVENOUS
  Filled 2014-09-20 (×2): qty 2

## 2014-09-20 MED ORDER — HYDROMORPHONE HCL 1 MG/ML IJ SOLN
INTRAMUSCULAR | Status: AC
  Filled 2014-09-20: qty 1

## 2014-09-20 MED ORDER — 0.9 % SODIUM CHLORIDE (POUR BTL) OPTIME
TOPICAL | Status: DC | PRN
  Administered 2014-09-20: 1000 mL

## 2014-09-20 MED ORDER — DEXAMETHASONE SODIUM PHOSPHATE 10 MG/ML IJ SOLN
INTRAMUSCULAR | Status: AC
  Filled 2014-09-20: qty 1

## 2014-09-20 MED ORDER — ONDANSETRON HCL 4 MG/2ML IJ SOLN
INTRAMUSCULAR | Status: DC | PRN
  Administered 2014-09-20: 4 mg via INTRAVENOUS

## 2014-09-20 MED ORDER — HYDROMORPHONE HCL 1 MG/ML IJ SOLN
1.0000 mg | INTRAMUSCULAR | Status: DC | PRN
  Administered 2014-09-20 (×3): 1 mg via INTRAVENOUS
  Filled 2014-09-20 (×3): qty 1

## 2014-09-20 MED ORDER — LACTATED RINGERS IV SOLN
INTRAVENOUS | Status: DC
  Administered 2014-09-20: 1000 mL via INTRAVENOUS

## 2014-09-20 MED ORDER — INSULIN ASPART 100 UNIT/ML ~~LOC~~ SOLN
0.0000 [IU] | Freq: Every day | SUBCUTANEOUS | Status: DC
  Administered 2014-09-20: 2 [IU] via SUBCUTANEOUS

## 2014-09-20 MED ORDER — ALBUTEROL SULFATE (2.5 MG/3ML) 0.083% IN NEBU: 2.5000 mg | INHALATION_SOLUTION | RESPIRATORY_TRACT | Status: DC | PRN

## 2014-09-20 MED ORDER — LISINOPRIL 10 MG PO TABS
10.0000 mg | ORAL_TABLET | Freq: Every day | ORAL | Status: DC
  Administered 2014-09-20 – 2014-09-21 (×2): 10 mg via ORAL
  Filled 2014-09-20 (×2): qty 1

## 2014-09-20 MED ORDER — MIDAZOLAM HCL 2 MG/2ML IJ SOLN
INTRAMUSCULAR | Status: AC
  Filled 2014-09-20: qty 2

## 2014-09-20 MED ORDER — CIPROFLOXACIN IN D5W 400 MG/200ML IV SOLN
INTRAVENOUS | Status: AC
  Filled 2014-09-20: qty 200

## 2014-09-20 MED ORDER — HYDROMORPHONE HCL 1 MG/ML IJ SOLN
0.2500 mg | INTRAMUSCULAR | Status: DC | PRN
  Administered 2014-09-20 (×2): 0.25 mg via INTRAVENOUS

## 2014-09-20 MED ORDER — ONDANSETRON HCL 4 MG PO TABS: 4.0000 mg | ORAL_TABLET | Freq: Four times a day (QID) | ORAL | Status: DC | PRN

## 2014-09-20 MED ORDER — ROCURONIUM BROMIDE 100 MG/10ML IV SOLN
INTRAVENOUS | Status: AC
  Filled 2014-09-20: qty 1

## 2014-09-20 MED ORDER — PROMETHAZINE HCL 25 MG/ML IJ SOLN
INTRAMUSCULAR | Status: AC
  Filled 2014-09-20: qty 1

## 2014-09-20 SURGICAL SUPPLY — 37 items
APL SKNCLS STERI-STRIP NONHPOA (GAUZE/BANDAGES/DRESSINGS) ×1
ATTRACTOMAT 16X20 MAGNETIC DRP (DRAPES) ×3 IMPLANT
BENZOIN TINCTURE PRP APPL 2/3 (GAUZE/BANDAGES/DRESSINGS) ×2 IMPLANT
BLADE HEX COATED 2.75 (ELECTRODE) ×3 IMPLANT
BLADE SURG 15 STRL LF DISP TIS (BLADE) ×1 IMPLANT
BLADE SURG 15 STRL SS (BLADE) ×3
CHLORAPREP W/TINT 26ML (MISCELLANEOUS) ×3 IMPLANT
CLIP TI MEDIUM 6 (CLIP) ×8 IMPLANT
CLIP TI WIDE RED SMALL 6 (CLIP) ×10 IMPLANT
CLOSURE WOUND 1/2 X4 (GAUZE/BANDAGES/DRESSINGS) ×1
DISSECTOR ROUND CHERRY 3/8 STR (MISCELLANEOUS) IMPLANT
DRAPE LAPAROTOMY T 98X78 PEDS (DRAPES) ×3 IMPLANT
DRESSING SURGICEL FIBRLLR 1X2 (HEMOSTASIS) ×1 IMPLANT
DRSG SURGICEL FIBRILLAR 1X2 (HEMOSTASIS) ×3
ELECT REM PT RETURN 9FT ADLT (ELECTROSURGICAL) ×3
ELECTRODE REM PT RTRN 9FT ADLT (ELECTROSURGICAL) ×1 IMPLANT
GAUZE SPONGE 4X4 12PLY STRL (GAUZE/BANDAGES/DRESSINGS) IMPLANT
GAUZE SPONGE 4X4 16PLY XRAY LF (GAUZE/BANDAGES/DRESSINGS) ×5 IMPLANT
GLOVE SURG ORTHO 8.0 STRL STRW (GLOVE) ×3 IMPLANT
GOWN STRL REUS W/TWL XL LVL3 (GOWN DISPOSABLE) ×10 IMPLANT
KIT BASIN OR (CUSTOM PROCEDURE TRAY) ×3 IMPLANT
LIQUID BAND (GAUZE/BANDAGES/DRESSINGS) ×1 IMPLANT
PACK BASIC VI WITH GOWN DISP (CUSTOM PROCEDURE TRAY) ×3 IMPLANT
PENCIL BUTTON HOLSTER BLD 10FT (ELECTRODE) ×3 IMPLANT
SHEARS HARMONIC 9CM CVD (BLADE) ×3 IMPLANT
STAPLER VISISTAT 35W (STAPLE) IMPLANT
STRIP CLOSURE SKIN 1/2X4 (GAUZE/BANDAGES/DRESSINGS) ×2 IMPLANT
SUT MNCRL AB 4-0 PS2 18 (SUTURE) ×3 IMPLANT
SUT SILK 2 0 (SUTURE)
SUT SILK 2-0 18XBRD TIE 12 (SUTURE) IMPLANT
SUT SILK 3 0 (SUTURE)
SUT SILK 3-0 18XBRD TIE 12 (SUTURE) IMPLANT
SUT VIC AB 3-0 SH 18 (SUTURE) ×6 IMPLANT
SYR BULB IRRIGATION 50ML (SYRINGE) ×3 IMPLANT
TOWEL OR 17X26 10 PK STRL BLUE (TOWEL DISPOSABLE) ×3 IMPLANT
TOWEL OR NON WOVEN STRL DISP B (DISPOSABLE) ×5 IMPLANT
YANKAUER SUCT BULB TIP 10FT TU (MISCELLANEOUS) ×3 IMPLANT

## 2014-09-20 NOTE — Transfer of Care (Signed)
Immediate Anesthesia Transfer of Care Note  Patient: [name]  Procedure(s) Performed: Procedure(s): TOTAL THYROIDECTOMY (N/A)  Patient Location: PACU  Anesthesia Type:General  Level of Consciousness: awake, alert , oriented and patient cooperative  Airway & Oxygen Therapy: Patient Spontanous Breathing and Patient connected to face mask oxygen  Post-op Assessment: Report given to RN, Post -op Vital signs reviewed and stable and Patient moving all extremities  Post vital signs: Reviewed and stable  Last Vitals:  Filed Vitals:   09/20/14 0728  BP: 155/75  Pulse: 64  Temp: 36.8 C  Resp: 16    Complications: No apparent anesthesia complications

## 2014-09-20 NOTE — Interval H&P Note (Signed)
History and Physical Interval Note:  09/20/2014 9:12 AM  [name]  has presented today for surgery, with the diagnosis of THYROID NEOLPLASM OF UNCERTAIN BEHAVIOR.  The various methods of treatment have been discussed with the patient and family. After consideration of risks, benefits and other options for treatment, the patient has consented to    Procedure(s): TOTAL THYROIDECTOMY (N/A) as a surgical intervention .    The patient's history has been reviewed, patient examined, no change in status, stable for surgery.  I have reviewed the patient's chart and labs.  Questions were answered to the patient's satisfaction.    Earnstine Regal, MD, [facility] Office: [place]

## 2014-09-20 NOTE — Progress Notes (Signed)
Spoke with Dr Ninfa Linden regarding pt blood sugar of 312.  Ordered to give sliding scale insulin for resistant achs and scheduled Metformin

## 2014-09-20 NOTE — Discharge Instructions (Signed)
[facility] ° °THYROID & PARATHYROID SURGERY:  POST-OP INSTRUCTIONS ° °Always review your discharge instruction sheet from the facility where your surgery was performed. ° °A prescription for pain medication may be given to you upon discharge.  Take your pain medication as prescribed.  If narcotic pain medicine is not needed, then you may take acetaminophen (Tylenol) or ibuprofen (Advil) as needed. ° °Take your usually prescribed medications unless otherwise directed. ° °If you need a refill on your pain medication, please contact your pharmacy. They will contact our office to request authorization.  Prescriptions will not be processed by our office after 5 pm or on weekends. ° °Start with a light diet upon arrival home, such as soup and crackers or toast.  Be sure to drink plenty of fluids daily.  Resume your normal diet the day after surgery. ° °Most patients will experience some swelling and bruising on the chest and neck area.  Ice packs will help.  Swelling and bruising can take several days to resolve.  ° °It is common to experience some constipation after surgery.  Increasing fluid intake and taking a stool softener will usually help or prevent this problem.  A mild laxative (Milk of Magnesia or Miralax) should be taken according to package directions if there has been no bowel movement after 48 hours. ° °You have steri-strips and a gauze dressing over your incision.  You may remove the gauze bandage on the second day after surgery, and you may shower at that time.  Leave your steri-strips (small skin tapes) in place directly over the incision.  These strips should remain on the skin for 5-7 days and then be removed.  You may get them wet in the shower and pat them dry. ° °You may resume regular (light) daily activities beginning the next day - such as daily self-care, walking, climbing stairs - gradually increasing activities as tolerated.  You may have sexual intercourse when it is  comfortable.  Refrain from any heavy lifting or straining until approved by your doctor.  You may drive when you no longer are taking prescription pain medication, you can comfortably wear a seatbelt, and you can safely maneuver your car and apply brakes. ° °You should see your doctor in the office for a follow-up appointment approximately two to three weeks after your surgery.  Make sure that you call for this appointment within a day or two after you arrive home to insure a convenient appointment time. ° °WHEN TO CALL YOUR DOCTOR: °-- Fever greater than 101.5 °-- Inability to urinate °-- Nausea and/or vomiting - persistent °-- Extreme swelling or bruising °-- Continued bleeding from incision °-- Increased pain, redness, or drainage from the incision °-- Difficulty swallowing or breathing °-- Muscle cramping or spasms °-- Numbness or tingling in hands or around lips ° °The clinic staff is available to answer your questions during regular business hours.  Please don’t hesitate to call and ask to speak to one of the nurses if you have concerns. ° °Mathis Cashman M. Delylah Stanczyk, MD, FACS °General & Endocrine Surgery °[facility] °Office: [phone] ° °Website: [email or web address] ° ° °

## 2014-09-20 NOTE — Anesthesia Postprocedure Evaluation (Signed)
  Anesthesia Post-op Note  Patient: [name]  Procedure(s) Performed: Procedure(s) (LRB): TOTAL THYROIDECTOMY (N/A)  Patient Location: PACU  Anesthesia Type: General  Level of Consciousness: awake and alert   Airway and Oxygen Therapy: Patient Spontanous Breathing  Post-op Pain: mild  Post-op Assessment: Post-op Vital signs reviewed, Patient's Cardiovascular Status Stable, Respiratory Function Stable, Patent Airway and No signs of Nausea or vomiting. Regular Novolog given for FBS 211.  Last Vitals:  Filed Vitals:   09/20/14 1213  BP:   Pulse: 59  Temp:   Resp: 18    Post-op Vital Signs: stable   Complications: No apparent anesthesia complications.

## 2014-09-20 NOTE — Anesthesia Preprocedure Evaluation (Addendum)
Anesthesia Evaluation  Patient identified by MRN, date of birth, ID band Patient awake    Reviewed: Allergy & Precautions, NPO status , Patient's Chart, lab work & pertinent test results  History of Anesthesia Complications (+) PONV, Family history of anesthesia reaction and history of anesthetic complications  Airway Mallampati: II  TM Distance: >3 FB Neck ROM: Full    Dental  (+) Poor Dentition, Dental Advisory Given,  Many broken and missing teeth.:   Pulmonary asthma ,  breath sounds clear to auscultation  Pulmonary exam normal       Cardiovascular Exercise Tolerance: Good hypertension, Pt. on medications and Pt. on home beta blockers + angina with exertion + Peripheral Vascular Disease Normal cardiovascular exam+ dysrhythmias + Valvular Problems/Murmurs Rhythm:Regular Rate:Normal     Neuro/Psych negative neurological ROS  negative psych ROS   GI/Hepatic negative GI ROS, Neg liver ROS,   Endo/Other  diabetes, Type 2, Oral Hypoglycemic AgentsMorbid obesity  Renal/GU negative Renal ROS  negative genitourinary   Musculoskeletal  (+) Arthritis -,   Abdominal (+) + obese,   Peds negative pediatric ROS (+)  Hematology  (+) anemia ,   Anesthesia Other Findings   Reproductive/Obstetrics negative OB ROS                          Anesthesia Physical Anesthesia Plan  ASA: III  Anesthesia Plan: General   Post-op Pain Management:    Induction: Intravenous  Airway Management Planned: Oral ETT  Additional Equipment:   Intra-op Plan:   Post-operative Plan: Extubation in OR  Informed Consent: I have reviewed the patients History and Physical, chart, labs and discussed the procedure including the risks, benefits and alternatives for the proposed anesthesia with the patient or authorized representative who has indicated his/her understanding and acceptance.   Dental advisory given  Plan  Discussed with: CRNA  Anesthesia Plan Comments:         Anesthesia Quick Evaluation

## 2014-09-20 NOTE — Op Note (Signed)
Procedure Note  Pre-operative Diagnosis:  Thyroid neoplasm of uncertain behavior  Post-operative Diagnosis:  same  Surgeon:  Earnstine Regal, MD, FACS  Assistant:  Byrd Hesselbach, RNFA   Procedure:  Total thyroidectomy  Anesthesia:  General  Estimated Blood Loss:  minimal  Drains: none         Specimen: thyroid to pathology  Indications:  The patient is a 55 year old female who presents with a thyroid nodule. Patient is referred by Dr. Carrolyn Meiers for evaluation of thyroid neoplasm of uncertain behavior. Patient had been admitted to the hospital in January 2016 for shortness of breath. As part of her evaluation she underwent a CT scan which showed an incidental finding of a nodule in the thyroid. Patient subsequently underwent ultrasound of the thyroid followed by ultrasound-guided fine-needle aspiration biopsy on May 21, 2014. This showed a dominant mass in the left thyroid lobe measuring 5.5 cm. 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   Procedure Details: Procedure was done in OR #1 at the [facility].  The patient was brought to the operating room and placed in a supine position on the operating room table.  Following administration of general anesthesia, the patient was positioned and then prepped and draped in the usual aseptic fashion.  After ascertaining that an adequate level of anesthesia had been achieved, a Kocher incision was made with #15 blade.  Dissection was carried through subcutaneous tissues and platysma. Hemostasis was achieved with the electrocautery.  Skin flaps were elevated cephalad and caudad from the thyroid notch to the sternal notch.  The Mahorner self-retaining retractor was placed for exposure.  Strap muscles were incised in the midline and dissection was begun on the left side.  Strap muscles were reflected laterally.  Left thyroid lobe was markedly enlarged and quite vascular.  The left  lobe was gently mobilized with blunt dissection.  Superior pole vessels were dissected out and divided individually between small and medium Ligaclips with the Harmonic scalpel.  The thyroid lobe was rolled anteriorly.  Branches of the inferior thyroid artery were divided between small Ligaclips with the Harmonic scalpel.  Inferior venous tributaries were divided between Ligaclips.  Both the superior and inferior parathyroid glands were identified and preserved on their vascular pedicles.  The recurrent laryngeal nerve was identified and preserved along its course.  The ligament of Gwenlyn Found was released with the electrocautery and the gland was mobilized onto the anterior trachea. Isthmus was mobilized across the midline.  There was moderate sized pyramidal lobe present which was dissected out and resected with the isthmus.  Dry pack was placed in the left neck.  Next, the right thyroid lobe was gently mobilized with blunt dissection.  Right thyroid lobe was normal in size but contained a very firm hard central nodule approximately 1 cm in size.  Superior pole vessels were dissected out and divided between small and medium Ligaclips with the Harmonic scalpel.  Superior parathyroid was identified and preserved.  Inferior venous tributaries were divided between medium Ligaclips with the Harmonic scalpel.  The right thyroid lobe was rolled anteriorly and the branches of the inferior thyroid artery divided between small Ligaclips.  The right recurrent laryngeal nerve was identified and preserved along its course.  The ligament of Gwenlyn Found was released with the electrocautery.  The right thyroid lobe was mobilized onto the anterior trachea and the remainder of the thyroid was dissected off the anterior trachea and the thyroid was  completely excised.  A suture was used to mark the right superior pole. The entire thyroid gland was submitted to pathology for review.  The neck was irrigated with warm saline.  Fibrillar was  placed throughout the operative field.  Strap muscles were reapproximated in the midline with interrupted 3-0 Vicryl sutures.  Platysma was closed with interrupted 3-0 Vicryl sutures.  Skin was closed with a running 4-0 Monocryl subcuticular suture.  Wound was washed and dried and benzoin and steri-strips were applied.  Dry gauze dressing was placed.  The patient was awakened from anesthesia and brought to the recovery room.  The patient tolerated the procedure well.   Earnstine Regal, MD, [facility] Office: [phone]

## 2014-09-20 NOTE — Anesthesia Procedure Notes (Signed)
Procedure Name: Intubation Date/Time: 09/20/2014 9:28 AM Performed by: Carleene Cooper A Pre-anesthesia Checklist: Patient identified, Timeout performed, Emergency Drugs available, Suction available and Patient being monitored Patient Re-evaluated:Patient Re-evaluated prior to inductionOxygen Delivery Method: Circle system utilized Preoxygenation: Pre-oxygenation with 100% oxygen Intubation Type: IV induction Ventilation: Mask ventilation without difficulty and Oral airway inserted - appropriate to patient size Laryngoscope Size: Mac and 4 (Intubation with ease by Dr. Delma Post) Grade View: Grade II Tube type: Oral Tube size: 7.5 mm Number of attempts: 1 Airway Equipment and Method: Stylet Placement Confirmation: ETT inserted through vocal cords under direct vision,  breath sounds checked- equal and bilateral and positive ETCO2 Secured at: 21 cm Tube secured with: Tape Dental Injury: Teeth and Oropharynx as per pre-operative assessment  Comments: Intubation with ease by Dr. Delma Post

## 2014-09-21 DIAGNOSIS — C73 Malignant neoplasm of thyroid gland: Secondary | ICD-10-CM | POA: Diagnosis not present

## 2014-09-21 LAB — BASIC METABOLIC PANEL
ANION GAP: 12 (ref 5–15)
BUN: 17 mg/dL (ref 6–20)
CALCIUM: 7.5 mg/dL — AB (ref 8.9–10.3)
CO2: 25 mmol/L (ref 22–32)
CREATININE: 0.92 mg/dL (ref 0.44–1.00)
Chloride: 100 mmol/L — ABNORMAL LOW (ref 101–111)
GFR calc Af Amer: >60 mL/min (ref >60)
Glucose, Bld: 217 mg/dL — ABNORMAL HIGH (ref 65–99)
Potassium: 3.6 mmol/L (ref 3.5–5.1)
Sodium: 137 mmol/L (ref 135–145)

## 2014-09-21 LAB — GLUCOSE, CAPILLARY: GLUCOSE-CAPILLARY: 172 mg/dL — AB (ref 65–99)

## 2014-09-21 MED ORDER — HYDROCODONE-ACETAMINOPHEN 5-325 MG PO TABS: 1.0000 | ORAL_TABLET | ORAL | Status: DC | PRN

## 2014-09-21 NOTE — Progress Notes (Signed)
Assessment unchanged. Pt and husband verbalized understanding of dc instructions through teach back. Able to articulate back to nurse s/o tetany. Scripts x 3 given as provided by MD. Discharged via wc to front entrance to meet awaiting vehicle to carry home. Accompanied by NT and husband.

## 2014-09-21 NOTE — Discharge Summary (Signed)
Physician Discharge Summary  Patient ID: Natane Heward MRN: [id number] DOB/AGE: [birth year] 54 y.o.  Admit date: 09/20/2014 Discharge date: 09/21/2014  Admission Diagnoses:  Discharge Diagnoses:  Principal Problem:   Neoplasm of uncertain behavior of thyroid gland   Discharged Condition: good  Hospital Course: uneventful post op recovery  Consults: None  Significant Diagnostic Studies:   Treatments: surgery: subtotal thyroidectomy  Discharge Exam: Blood pressure 133/68, pulse 65, temperature 98 F (36.7 C), temperature source Oral, resp. rate 16, height 5\' 6"  (1.676 m), weight 116.121 kg (256 lb), SpO2 98 %. General appearance: alert, cooperative and no distress Resp: clear to auscultation bilaterally Cardio: regular rate and rhythm, S1, S2 normal, no murmur, click, rub or gallop Incision/Wound:voice hoarse, neck dressing dry, no hematoma  Disposition: 01-Home or Self Care     Medication List    TAKE these medications        acetaminophen 650 MG CR tablet  Commonly known as:  TYLENOL  Take 1,300 mg by mouth every 8 (eight) hours as needed for pain.     albuterol 108 (90 BASE) MCG/ACT inhaler  Commonly known as:  PROVENTIL HFA;VENTOLIN HFA  Inhale 2 puffs into the lungs every 4 (four) hours as needed for wheezing or shortness of breath.     aspirin EC 325 MG tablet  Take 325 mg by mouth daily.     atenolol 50 MG tablet  Commonly known as:  TENORMIN  Take 50 mg by mouth 2 (two) times daily.     calcium-vitamin D 500-200 MG-UNIT per tablet  Commonly known as:  OSCAL WITH D  Take 1 tablet by mouth 3 (three) times daily.     ciprofloxacin 500 MG tablet  Commonly known as:  CIPRO  Take 1 tablet (500 mg total) by mouth every 12 (twelve) hours.     FISH OIL PO  Take 1 capsule by mouth at bedtime.     furosemide 40 MG tablet  Commonly known as:  LASIX  Take 80 mg by mouth 2 (two) times daily.     HYDROcodone-acetaminophen 5-325 MG per tablet  Commonly known  as:  NORCO/VICODIN  Take 1-2 tablets by mouth every 4 (four) hours as needed for moderate pain.     ibuprofen 200 MG tablet  Commonly known as:  ADVIL,MOTRIN  Take 800 mg by mouth 2 (two) times daily as needed (pain).     lisinopril 10 MG tablet  Commonly known as:  PRINIVIL,ZESTRIL  Take 1 tablet by mouth daily.     meloxicam 15 MG tablet  Commonly known as:  MOBIC  Take 15 mg by mouth daily.     metFORMIN 1000 MG tablet  Commonly known as:  GLUCOPHAGE  Take 1 tablet (1,000 mg total) by mouth 2 (two) times daily with a meal.     MOVE FREE PO  Take 2 tablets by mouth daily.     ondansetron 8 MG disintegrating tablet  Commonly known as:  ZOFRAN-ODT  Take 8 mg by mouth every 8 (eight) hours as needed for nausea or vomiting.     potassium chloride SA 20 MEQ tablet  Commonly known as:  K-DUR,KLOR-CON  Take 40 mEq by mouth 2 (two) times daily.     pravastatin 20 MG tablet  Commonly known as:  PRAVACHOL  Take 20 mg by mouth daily.     PROBIOTIC PO  Take 1 tablet by mouth daily.     SYNTHROID 100 MCG tablet  Generic drug:  levothyroxine  Take  1 tablet (100 mcg total) by mouth daily.     VITAMIN B COMPLEX PO  Take 1 tablet by mouth daily.           Follow-up Information    Follow up with Earnstine Regal, MD. Schedule an appointment as soon as possible for a visit in 3 weeks.   Specialty:  General Surgery   Why:  For wound re-check   Contact information:   [place] [address] [phone]       Signed: Harl Bowie 09/21/2014, 7:41 AM

## 2014-09-21 NOTE — Progress Notes (Signed)
Patient ID: [name], female   DOB: 29-Oct-1960, 54 y.o.   MRN: [id number]  Mild soreness and hoarseness Otherwise without complaints  Ca+ is 7.5  Plan: Discharge home

## 2014-09-23 ENCOUNTER — Other Ambulatory Visit: Payer: Self-pay

## 2014-09-23 ENCOUNTER — Telehealth: Payer: Self-pay | Admitting: General Surgery

## 2014-09-23 ENCOUNTER — Encounter (HOSPITAL_COMMUNITY): Payer: Self-pay | Admitting: Surgery

## 2014-09-23 NOTE — Telephone Encounter (Signed)
Pt c/o numbness and tingling in her hands and fingers.  Also has had nausea and vomiting over the weekend.  Instructed pt to double her calcium intake this am and await further instructions by Dr Harlow Asa later this am.

## 2014-10-03 ENCOUNTER — Encounter (HOSPITAL_COMMUNITY): Payer: Self-pay

## 2014-10-07 ENCOUNTER — Emergency Department (HOSPITAL_COMMUNITY)
Admission: EM | Admit: 2014-10-07 | Discharge: 2014-10-07 | Disposition: A | Discharge status: Home / Self Care | Payer: BC Managed Care – PPO | Attending: Emergency Medicine | Admitting: Emergency Medicine

## 2014-10-07 ENCOUNTER — Encounter (HOSPITAL_COMMUNITY): Payer: Self-pay | Admitting: Emergency Medicine

## 2014-10-07 DIAGNOSIS — Z791 Long term (current) use of non-steroidal anti-inflammatories (NSAID): Secondary | ICD-10-CM | POA: Insufficient documentation

## 2014-10-07 DIAGNOSIS — Z7982 Long term (current) use of aspirin: Secondary | ICD-10-CM | POA: Insufficient documentation

## 2014-10-07 DIAGNOSIS — J45909 Unspecified asthma, uncomplicated: Secondary | ICD-10-CM | POA: Diagnosis not present

## 2014-10-07 DIAGNOSIS — Z79899 Other long term (current) drug therapy: Secondary | ICD-10-CM | POA: Insufficient documentation

## 2014-10-07 DIAGNOSIS — N390 Urinary tract infection, site not specified: Secondary | ICD-10-CM | POA: Insufficient documentation

## 2014-10-07 DIAGNOSIS — Z862 Personal history of diseases of the blood and blood-forming organs and certain disorders involving the immune mechanism: Secondary | ICD-10-CM | POA: Insufficient documentation

## 2014-10-07 DIAGNOSIS — E780 Pure hypercholesterolemia: Secondary | ICD-10-CM | POA: Diagnosis not present

## 2014-10-07 DIAGNOSIS — R011 Cardiac murmur, unspecified: Secondary | ICD-10-CM | POA: Insufficient documentation

## 2014-10-07 DIAGNOSIS — M1990 Unspecified osteoarthritis, unspecified site: Secondary | ICD-10-CM | POA: Diagnosis not present

## 2014-10-07 DIAGNOSIS — M791 Myalgia: Secondary | ICD-10-CM | POA: Diagnosis present

## 2014-10-07 DIAGNOSIS — I10 Essential (primary) hypertension: Secondary | ICD-10-CM | POA: Insufficient documentation

## 2014-10-07 DIAGNOSIS — E119 Type 2 diabetes mellitus without complications: Secondary | ICD-10-CM | POA: Insufficient documentation

## 2014-10-07 LAB — CBC WITH DIFFERENTIAL/PLATELET
Basophils Absolute: 0 10*3/uL (ref 0.0–0.1)
Basophils Relative: 0 % (ref 0–1)
EOS ABS: 0.7 10*3/uL (ref 0.0–0.7)
Eosinophils Relative: 9 % — ABNORMAL HIGH (ref 0–5)
HCT: 37.6 % (ref 36.0–46.0)
Hemoglobin: 12.3 g/dL (ref 12.0–15.0)
LYMPHS ABS: 2.2 10*3/uL (ref 0.7–4.0)
LYMPHS PCT: 29 % (ref 12–46)
MCH: 29.3 pg (ref 26.0–34.0)
MCHC: 32.7 g/dL (ref 30.0–36.0)
MCV: 89.5 fL (ref 78.0–100.0)
MONOS PCT: 4 % (ref 3–12)
Monocytes Absolute: 0.3 10*3/uL (ref 0.1–1.0)
Neutro Abs: 4.2 10*3/uL (ref 1.7–7.7)
Neutrophils Relative %: 58 % (ref 43–77)
Platelets: 257 10*3/uL (ref 150–400)
RBC: 4.2 MIL/uL (ref 3.87–5.11)
RDW: 13.9 % (ref 11.5–15.5)
WBC: 7.3 10*3/uL (ref 4.0–10.5)

## 2014-10-07 LAB — URINALYSIS, ROUTINE W REFLEX MICROSCOPIC
Bilirubin Urine: NEGATIVE
GLUCOSE, UA: NEGATIVE mg/dL
Hgb urine dipstick: NEGATIVE
KETONES UR: NEGATIVE mg/dL
NITRITE: NEGATIVE
Protein, ur: NEGATIVE mg/dL
Specific Gravity, Urine: 1.014 (ref 1.005–1.030)
Urobilinogen, UA: 0.2 mg/dL (ref 0.0–1.0)
pH: 5.5 (ref 5.0–8.0)

## 2014-10-07 LAB — BASIC METABOLIC PANEL
Anion gap: 11 (ref 5–15)
BUN: 14 mg/dL (ref 6–20)
CALCIUM: 8.2 mg/dL — AB (ref 8.9–10.3)
CHLORIDE: 104 mmol/L (ref 101–111)
CO2: 23 mmol/L (ref 22–32)
Creatinine, Ser: 0.97 mg/dL (ref 0.44–1.00)
GFR calc Af Amer: >60 mL/min (ref >60)
Glucose, Bld: 174 mg/dL — ABNORMAL HIGH (ref 65–99)
Potassium: 4 mmol/L (ref 3.5–5.1)
Sodium: 138 mmol/L (ref 135–145)

## 2014-10-07 LAB — URINE MICROSCOPIC-ADD ON

## 2014-10-07 MED ORDER — CIPROFLOXACIN HCL 500 MG PO TABS: 500.0000 mg | ORAL_TABLET | Freq: Two times a day (BID) | ORAL | Status: DC

## 2014-10-07 NOTE — ED Notes (Addendum)
Pt had thyroid surgery 09/20/14, and now c/o chills, body ache, nauseated and body sweats. Symptom onsets last night. Soreness noted to left side of neck . Pt rates pain as 8/10.

## 2014-10-07 NOTE — Discharge Instructions (Signed)

## 2014-10-07 NOTE — ED Provider Notes (Addendum)
CSN: [id number]     Arrival date & time 10/07/14  0857 History   First MD Initiated Contact with Patient 10/07/14 0915     Chief Complaint  Patient presents with  . Generalized Body Aches     (Consider location/radiation/quality/duration/timing/severity/associated sxs/prior Treatment) HPI Comments: Patient presents with chills and body aches. She is status post thyroidectomy on June 24 by Dr. Harlow Asa.  She states the incisions been healing well. There is no drainage or increased pain to the area. She noted some chills and body aches that started last night. She doesn't know if she's had a fever because she's been taking Tylenol and ibuprofen routinely. She did take 3 Tylenol this morning. I did advise her that she needs to not take more than 2 Tylenol every 4 hours. She denies any cough or chest congestion. She had an episode of vomiting this morning but she says that that's not abnormal for her. She typically takes Zofran at home for periodic nausea and vomiting but she ran out. She denies any abdominal pain. She denies any urinary symptoms although she did have some pain on urination earlier this week. She denies any other sores or wounds.   Past Medical History  Diagnosis Date  . Diabetes mellitus   . Hypertension   . Hypercholesteremia   . Heart murmur   . Complication of anesthesia   . PONV (postoperative nausea and vomiting)   . 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 Family history of adverse reaction to anesthesia     difficult for father to wake after surgery  . Dysrhythmia     " irregular heart rate per patient"  . Asthma     due to allergies   . Anemia     hx of    Past Surgical History  Procedure Laterality Date  . Colon surgery    . Abdominal hysterectomy    . Tonsillectomy    . Knee arthroscopy    . Cholecystectomy    . Left heart catheterization with coronary angiogram N/A 04/24/2014    Procedure: LEFT HEART CATHETERIZATION WITH CORONARY ANGIOGRAM;  Surgeon: Burnell Blanks, MD;  Location: [facility] CATH LAB;  Service: Cardiovascular;  Laterality: N/A;  . Thyroidectomy N/A 09/20/2014    Procedure: TOTAL THYROIDECTOMY;  Surgeon: Armandina Gemma, MD;  Location: [facility] ORS;  Service: General;  Laterality: N/A;   Family History  Problem Relation Age of Onset  . Heart failure Mother   . Stroke Father   . Heart failure Father   . Diabetes Mother   . Cancer Father    History  Substance Use Topics  . Smoking status: Never Smoker   . Smokeless tobacco: Never Used  . Alcohol Use: No   OB History    No data available     Review of Systems  Constitutional: Positive for chills. Negative for fever, diaphoresis and fatigue.  HENT: Negative for congestion, rhinorrhea and sneezing.   Eyes: Negative.   Respiratory: Negative for cough, chest tightness and shortness of breath.   Cardiovascular: Negative for chest pain and leg swelling.  Gastrointestinal: Negative for nausea, vomiting, abdominal pain, diarrhea and blood in stool.  Genitourinary: Negative for frequency, hematuria, flank pain and difficulty urinating.  Musculoskeletal: Positive for myalgias. Negative for back pain and arthralgias.  Skin: Negative for rash.  Neurological: Negative for dizziness, speech difficulty, weakness, numbness and headaches.      Allergies  Lorabid; Codeine; Sulfa drugs cross reactors; Benicar; and  Other  Home Medications   Prior to Admission medications   Medication Sig Start Date End Date Taking? Authorizing Provider  acetaminophen (TYLENOL) 650 MG CR tablet Take 1,950 mg by mouth every 8 (eight) hours as needed for pain.    Yes Historical Provider, MD  aspirin EC 325 MG tablet Take 325 mg by mouth daily.   Yes Historical Provider, MD  atenolol (TENORMIN) 50 MG tablet Take 50 mg by mouth 2 (two) times daily.   Yes Historical Provider, MD  calcium-vitamin D (OSCAL WITH D) 500-200 MG-UNIT per tablet Take 1 tablet by mouth 3 (three) times daily. Patient taking differently: Take  2 tablets by mouth 3 (three) times daily.  09/20/14  Yes Armandina Gemma, MD  Diclofenac Sodium 3 % GEL Place 1 application onto the skin daily as needed (ankle pain).   Yes Historical Provider, MD  furosemide (LASIX) 40 MG tablet Take 80 mg by mouth 2 (two) times daily.    Yes Historical Provider, MD  HYDROcodone-acetaminophen (NORCO/VICODIN) 5-325 MG per tablet Take 1-2 tablets by mouth every 4 (four) hours as needed for moderate pain. 09/21/14  Yes Coralie Keens, MD  ibuprofen (ADVIL,MOTRIN) 200 MG tablet Take 800 mg by mouth 2 (two) times daily as needed (pain).   Yes Historical Provider, MD  lisinopril (PRINIVIL,ZESTRIL) 10 MG tablet Take 1 tablet by mouth daily. 09/09/14  Yes Historical Provider, MD  meloxicam (MOBIC) 15 MG tablet Take 15 mg by mouth daily.   Yes Historical Provider, MD  metFORMIN (GLUCOPHAGE) 1000 MG tablet Take 1 tablet (1,000 mg total) by mouth 2 (two) times daily with a meal. 04/29/14  Yes Maryann Mikhail, DO  Omega-3 Fatty Acids (FISH OIL PO) Take 1 capsule by mouth at bedtime.    Yes Historical Provider, MD  ondansetron (ZOFRAN-ODT) 8 MG disintegrating tablet Take 8 mg by mouth every 8 (eight) hours as needed for nausea or vomiting.   Yes Historical Provider, MD  potassium chloride SA (K-DUR,KLOR-CON) 20 MEQ tablet Take 40 mEq by mouth 2 (two) times daily.    Yes Historical Provider, MD  pravastatin (PRAVACHOL) 20 MG tablet Take 20 mg by mouth daily.  06/25/13  Yes Historical Provider, MD  Probiotic Product (PROBIOTIC PO) Take 1 tablet by mouth daily.   Yes Historical Provider, MD  SYNTHROID 100 MCG tablet Take 1 tablet (100 mcg total) by mouth daily. 09/20/14  Yes Armandina Gemma, MD  albuterol (PROVENTIL HFA;VENTOLIN HFA) 108 (90 BASE) MCG/ACT inhaler Inhale 2 puffs into the lungs every 4 (four) hours as needed for wheezing or shortness of breath. 09/07/13   Leeanne Rio, MD  B Complex Vitamins (VITAMIN B COMPLEX PO) Take 1 tablet by mouth daily.    Historical Provider, MD   ciprofloxacin (CIPRO) 500 MG tablet Take 1 tablet (500 mg total) by mouth every 12 (twelve) hours. Patient not taking: Reported on 09/11/2014 05/12/14   Orlie Dakin, MD  Glucosamine-Chondroitin (MOVE FREE PO) Take 2 tablets by mouth daily.    Historical Provider, MD   BP 137/72 mmHg  Pulse 53  Temp(Src) 97.8 F (36.6 C) (Oral)  Resp 18  Ht 5\' 6"  (1.676 m)  Wt 263 lb (119.296 kg)  BMI 42.47 kg/m2  SpO2 98% Physical Exam  Constitutional: She is oriented to person, place, and time. She appears well-developed and well-nourished.  HENT:  Head: Normocephalic and atraumatic.  Well healing surgical incision. There is no erythema or signs of infection.  Eyes: Pupils are equal, round, and reactive to  light.  Neck: Normal range of motion. Neck supple.  Cardiovascular: Normal rate, regular rhythm and normal heart sounds.   Pulmonary/Chest: Effort normal and breath sounds normal. No respiratory distress. She has no wheezes. She has no rales. She exhibits no tenderness.  Abdominal: Soft. Bowel sounds are normal. There is no tenderness. There is no rebound and no guarding.  Musculoskeletal: Normal range of motion. She exhibits no edema.  Lymphadenopathy:    She has no cervical adenopathy.  Neurological: She is alert and oriented to person, place, and time.  Skin: Skin is warm and dry. No rash noted.  Psychiatric: She has a normal mood and affect.    ED Course  Procedures (including critical care time) Labs Review Labs Reviewed  BASIC METABOLIC PANEL - Abnormal; Notable for the following:    Glucose, Bld 174 (*)    Calcium 8.2 (*)    All other components within normal limits  CBC WITH DIFFERENTIAL/PLATELET - Abnormal; Notable for the following:    Eosinophils Relative 9 (*)    All other components within normal limits  URINALYSIS, ROUTINE W REFLEX MICROSCOPIC (NOT AT [facility]) - Abnormal; Notable for the following:    Leukocytes, UA SMALL (*)    All other components within normal limits   URINE MICROSCOPIC-ADD ON - Abnormal; Notable for the following:    Bacteria, UA MANY (*)    All other components within normal limits  URINE CULTURE    Imaging Review No results found.   EKG Interpretation None      MDM   Final diagnoses:  UTI (lower urinary tract infection)    Patient has evidence of a urinary tract infection. Her urine was sent for culture. She has no respiratory symptoms. She has no signs of a wound infection. Her calcium is improving. She was started on cipro. She was discharged home in good condition and encouraged to follow-up with her primary care physician if her symptoms are not improving.    Malvin Johns, MD 10/07/14 [place], MD 10/07/14 1123

## 2014-10-09 LAB — URINE CULTURE
Culture: >=100000
Special Requests: NORMAL

## 2014-10-11 ENCOUNTER — Telehealth: Payer: Self-pay | Admitting: *Deleted

## 2014-10-11 NOTE — ED Notes (Signed)
(+)  urine culture, treated with Ciprofloxacin, OK per J. Judie Bonus, Pharm

## 2015-03-07 ENCOUNTER — Encounter (HOSPITAL_COMMUNITY): Payer: Self-pay | Admitting: *Deleted

## 2015-03-07 ENCOUNTER — Emergency Department (HOSPITAL_COMMUNITY)
Admission: EM | Admit: 2015-03-07 | Discharge: 2015-03-07 | Disposition: A | Discharge status: Home / Self Care | Payer: BC Managed Care – PPO | Attending: Emergency Medicine | Admitting: Emergency Medicine

## 2015-03-07 DIAGNOSIS — Z7984 Long term (current) use of oral hypoglycemic drugs: Secondary | ICD-10-CM | POA: Insufficient documentation

## 2015-03-07 DIAGNOSIS — I10 Essential (primary) hypertension: Secondary | ICD-10-CM | POA: Insufficient documentation

## 2015-03-07 DIAGNOSIS — E7800 Pure hypercholesterolemia, unspecified: Secondary | ICD-10-CM | POA: Diagnosis not present

## 2015-03-07 DIAGNOSIS — G8929 Other chronic pain: Secondary | ICD-10-CM | POA: Insufficient documentation

## 2015-03-07 DIAGNOSIS — R079 Chest pain, unspecified: Secondary | ICD-10-CM | POA: Diagnosis not present

## 2015-03-07 DIAGNOSIS — E079 Disorder of thyroid, unspecified: Secondary | ICD-10-CM | POA: Insufficient documentation

## 2015-03-07 DIAGNOSIS — R011 Cardiac murmur, unspecified: Secondary | ICD-10-CM | POA: Insufficient documentation

## 2015-03-07 DIAGNOSIS — E119 Type 2 diabetes mellitus without complications: Secondary | ICD-10-CM | POA: Diagnosis not present

## 2015-03-07 DIAGNOSIS — R1010 Upper abdominal pain, unspecified: Secondary | ICD-10-CM | POA: Diagnosis present

## 2015-03-07 DIAGNOSIS — Z7982 Long term (current) use of aspirin: Secondary | ICD-10-CM | POA: Diagnosis not present

## 2015-03-07 DIAGNOSIS — J45909 Unspecified asthma, uncomplicated: Secondary | ICD-10-CM | POA: Insufficient documentation

## 2015-03-07 DIAGNOSIS — Z79899 Other long term (current) drug therapy: Secondary | ICD-10-CM | POA: Insufficient documentation

## 2015-03-07 DIAGNOSIS — Z862 Personal history of diseases of the blood and blood-forming organs and certain disorders involving the immune mechanism: Secondary | ICD-10-CM | POA: Diagnosis not present

## 2015-03-07 DIAGNOSIS — Z9889 Other specified postprocedural states: Secondary | ICD-10-CM | POA: Diagnosis not present

## 2015-03-07 DIAGNOSIS — R1013 Epigastric pain: Secondary | ICD-10-CM

## 2015-03-07 DIAGNOSIS — Z9049 Acquired absence of other specified parts of digestive tract: Secondary | ICD-10-CM | POA: Insufficient documentation

## 2015-03-07 DIAGNOSIS — Z85850 Personal history of malignant neoplasm of thyroid: Secondary | ICD-10-CM | POA: Insufficient documentation

## 2015-03-07 DIAGNOSIS — M1990 Unspecified osteoarthritis, unspecified site: Secondary | ICD-10-CM | POA: Insufficient documentation

## 2015-03-07 DIAGNOSIS — N3000 Acute cystitis without hematuria: Secondary | ICD-10-CM | POA: Insufficient documentation

## 2015-03-07 HISTORY — DX: Malignant (primary) neoplasm, unspecified: C80.1

## 2015-03-07 HISTORY — DX: Disorder of thyroid, unspecified: E07.9

## 2015-03-07 LAB — COMPREHENSIVE METABOLIC PANEL
ALK PHOS: 65 U/L (ref 38–126)
ALT: 25 U/L (ref 14–54)
ANION GAP: 15 (ref 5–15)
AST: 25 U/L (ref 15–41)
Albumin: 3.7 g/dL (ref 3.5–5.0)
BILIRUBIN TOTAL: 1 mg/dL (ref 0.3–1.2)
BUN: 12 mg/dL (ref 6–20)
CALCIUM: 8.2 mg/dL — AB (ref 8.9–10.3)
CO2: 21 mmol/L — AB (ref 22–32)
Chloride: 104 mmol/L (ref 101–111)
Creatinine, Ser: 1.01 mg/dL — ABNORMAL HIGH (ref 0.44–1.00)
GFR calc Af Amer: >60 mL/min (ref >60)
GFR calc non Af Amer: >60 mL/min (ref >60)
Glucose, Bld: 236 mg/dL — ABNORMAL HIGH (ref 65–99)
Potassium: 4.5 mmol/L (ref 3.5–5.1)
Sodium: 140 mmol/L (ref 135–145)
TOTAL PROTEIN: 7.3 g/dL (ref 6.5–8.1)

## 2015-03-07 LAB — CBC
HCT: 42.7 % (ref 36.0–46.0)
HEMOGLOBIN: 14.1 g/dL (ref 12.0–15.0)
MCH: 30.7 pg (ref 26.0–34.0)
MCHC: 33 g/dL (ref 30.0–36.0)
MCV: 92.8 fL (ref 78.0–100.0)
Platelets: 242 10*3/uL (ref 150–400)
RBC: 4.6 MIL/uL (ref 3.87–5.11)
RDW: 13.6 % (ref 11.5–15.5)
WBC: 10.1 10*3/uL (ref 4.0–10.5)

## 2015-03-07 LAB — URINE MICROSCOPIC-ADD ON

## 2015-03-07 LAB — URINALYSIS, ROUTINE W REFLEX MICROSCOPIC
Bilirubin Urine: NEGATIVE
Glucose, UA: NEGATIVE mg/dL
Ketones, ur: 15 mg/dL — AB
NITRITE: POSITIVE — AB
Protein, ur: 30 mg/dL — AB
SPECIFIC GRAVITY, URINE: 1.018 (ref 1.005–1.030)
pH: 6 (ref 5.0–8.0)

## 2015-03-07 LAB — LIPASE, BLOOD: Lipase: 34 U/L (ref 11–51)

## 2015-03-07 MED ORDER — CIPROFLOXACIN HCL 500 MG PO TABS
500.0000 mg | ORAL_TABLET | Freq: Once | ORAL | Status: AC
  Administered 2015-03-07: 500 mg via ORAL
  Filled 2015-03-07: qty 1

## 2015-03-07 MED ORDER — PROMETHAZINE HCL 25 MG PO TABS
25.0000 mg | ORAL_TABLET | Freq: Four times a day (QID) | ORAL | Status: DC | PRN
Start: 2015-03-07 — End: 2015-08-23

## 2015-03-07 MED ORDER — SUCRALFATE 1 G PO TABS: 1.0000 g | ORAL_TABLET | Freq: Three times a day (TID) | ORAL | Status: DC

## 2015-03-07 MED ORDER — PROMETHAZINE HCL 25 MG/ML IJ SOLN
12.5000 mg | Freq: Once | INTRAMUSCULAR | Status: AC
  Administered 2015-03-07: 12.5 mg via INTRAVENOUS
  Filled 2015-03-07: qty 1

## 2015-03-07 MED ORDER — CIPROFLOXACIN HCL 500 MG PO TABS: 500.0000 mg | ORAL_TABLET | Freq: Two times a day (BID) | ORAL | Status: DC

## 2015-03-07 MED ORDER — SODIUM CHLORIDE 0.9 % IV BOLUS (SEPSIS)
1000.0000 mL | Freq: Once | INTRAVENOUS | Status: AC
  Administered 2015-03-07: 1000 mL via INTRAVENOUS

## 2015-03-07 MED ORDER — ACETAMINOPHEN 500 MG PO TABS
1000.0000 mg | ORAL_TABLET | Freq: Once | ORAL | Status: AC
  Administered 2015-03-07: 1000 mg via ORAL
  Filled 2015-03-07: qty 2

## 2015-03-07 MED ORDER — LANSOPRAZOLE 15 MG PO CPDR: 15.0000 mg | DELAYED_RELEASE_CAPSULE | Freq: Every day | ORAL | Status: DC

## 2015-03-07 NOTE — ED Provider Notes (Signed)
CSN: [id number]     Arrival date & time 03/07/15  1434 History   First MD Initiated Contact with Patient 03/07/15 1735     Chief Complaint  Patient presents with  . Emesis  . Abdominal Pain     (Consider location/radiation/quality/duration/timing/severity/associated sxs/prior Treatment) HPI Comments: 54yo F w/ PMH including HTN, T2DM, HLD, thyroid cancer who p/w abd pain and vomiting. Patient reports a 2 week history of intermittent episodes of upper abdominal pain that are worse with eating. She has had associated nausea and vomiting, last episode of vomiting was 2 PM today. She reports some mild chest pain after she was vomiting but denies any currently. She has chronic chest pain and shortness of breath since her thyroid cancer but denies any change recently. She has loose stools chronically but denies any blood. She reports daily ibuprofen use at night for a long time. No fevers.  Patient is a 54 y.o. female presenting with vomiting and abdominal pain. The history is provided by the patient.  Emesis Associated symptoms: abdominal pain   Abdominal Pain Associated symptoms: vomiting     Past Medical History  Diagnosis Date  . Diabetes mellitus   . Hypertension   . Hypercholesteremia   . Heart murmur   . Complication of anesthesia   . PONV (postoperative nausea and vomiting)   . 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 Family history of adverse reaction to anesthesia     difficult for father to wake after surgery  . Dysrhythmia     " irregular heart rate per patient"  . Asthma     due to allergies   . Anemia     hx of   . Thyroid disease   . Cancer [facility])    Past Surgical History  Procedure Laterality Date  . Colon surgery    . Abdominal hysterectomy    . Tonsillectomy    . Knee arthroscopy    . Cholecystectomy    . Left heart catheterization with coronary angiogram N/A 04/24/2014    Procedure: LEFT HEART CATHETERIZATION WITH CORONARY ANGIOGRAM;  Surgeon: Burnell Blanks, MD;  Location: [facility] CATH LAB;  Service: Cardiovascular;  Laterality: N/A;  . Thyroidectomy N/A 09/20/2014    Procedure: TOTAL THYROIDECTOMY;  Surgeon: Armandina Gemma, MD;  Location: [facility] ORS;  Service: General;  Laterality: N/A;   Family History  Problem Relation Age of Onset  . Heart failure Mother   . Stroke Father   . Heart failure Father   . Diabetes Mother   . Cancer Father    Social History  Substance Use Topics  . Smoking status: Never Smoker   . Smokeless tobacco: Never Used  . Alcohol Use: No   OB History    No data available     Review of Systems  Gastrointestinal: Positive for vomiting and abdominal pain.   10 Systems reviewed and are negative for acute change except as noted in the HPI.    Allergies  Lorabid; Codeine; Sulfa drugs cross reactors; Benicar; and Other  Home Medications   Prior to Admission medications   Medication Sig Start Date End Date Taking? Authorizing Provider  acetaminophen (TYLENOL) 650 MG CR tablet Take 650 mg by mouth every 8 (eight) hours as needed for pain.    Yes Historical Provider, MD  albuterol (PROVENTIL HFA;VENTOLIN HFA) 108 (90 BASE) MCG/ACT inhaler Inhale 2 puffs into the lungs every 4 (four) hours as needed for wheezing or shortness of breath.  09/07/13  Yes Leeanne Rio, MD  aspirin EC 325 MG tablet Take 325 mg by mouth daily.   Yes Historical Provider, MD  atenolol (TENORMIN) 50 MG tablet Take 50 mg by mouth 2 (two) times daily.   Yes Historical Provider, MD  calcium-vitamin D (OSCAL WITH D) 500-200 MG-UNIT per tablet Take 1 tablet by mouth 3 (three) times daily. Patient taking differently: Take 2 tablets by mouth 3 (three) times daily.  09/20/14  Yes Armandina Gemma, MD  Diclofenac Sodium 3 % GEL Place 1 application onto the skin daily as needed (ankle pain).   Yes Historical Provider, MD  furosemide (LASIX) 80 MG tablet Take 80 mg by mouth 2 (two) times daily.   Yes Historical Provider, MD  levothyroxine (SYNTHROID,  LEVOTHROID) 200 MCG tablet Take 200-400 mcg by mouth See admin instructions. TK 1 T PO DAILY ON MONDAY THRU FRIDAY AND 2 TS PO DAILY ON SAT. AND SUNDAY 02/10/15  Yes Historical Provider, MD  lisinopril (PRINIVIL,ZESTRIL) 10 MG tablet Take 10 mg by mouth daily.  09/09/14  Yes Historical Provider, MD  metFORMIN (GLUCOPHAGE) 1000 MG tablet Take 1 tablet (1,000 mg total) by mouth 2 (two) times daily with a meal. 04/29/14  Yes Maryann Mikhail, DO  ondansetron (ZOFRAN-ODT) 8 MG disintegrating tablet Take 8 mg by mouth every 8 (eight) hours as needed for nausea or vomiting.   Yes Historical Provider, MD  potassium chloride SA (K-DUR,KLOR-CON) 20 MEQ tablet Take 40 mEq by mouth 2 (two) times daily.    Yes Historical Provider, MD  pravastatin (PRAVACHOL) 20 MG tablet Take 20 mg by mouth daily.  06/25/13  Yes Historical Provider, MD  Probiotic Product (PROBIOTIC PO) Take 1 tablet by mouth daily.   Yes Historical Provider, MD  temazepam (RESTORIL) 15 MG capsule Take 15 mg by mouth at bedtime. 01/25/15  Yes Historical Provider, MD  ciprofloxacin (CIPRO) 500 MG tablet Take 1 tablet (500 mg total) by mouth 2 (two) times daily. 03/07/15   Sharlett Iles, MD  lansoprazole (PREVACID) 15 MG capsule Take 1 capsule (15 mg total) by mouth daily at 12 noon. 03/07/15   Sharlett Iles, MD  promethazine (PHENERGAN) 25 MG tablet Take 1 tablet (25 mg total) by mouth every 6 (six) hours as needed for nausea or vomiting. 03/07/15   Sharlett Iles, MD  sucralfate (CARAFATE) 1 G tablet Take 1 tablet (1 g total) by mouth 4 (four) times daily -  with meals and at bedtime. 03/07/15   Sharlett Iles, MD  SYNTHROID 100 MCG tablet Take 1 tablet (100 mcg total) by mouth daily. Patient not taking: Reported on 03/07/2015 09/20/14   Armandina Gemma, MD   BP 121/85 mmHg  Pulse 86  Temp(Src) 99.5 F (37.5 C) (Oral)  Resp 16  SpO2 100% Physical Exam  Constitutional: She is oriented to person, place, and time. She appears  well-developed and well-nourished. No distress.  HENT:  Head: Normocephalic and atraumatic.  Mildly dry mucous membranes  Eyes: Conjunctivae are normal. Pupils are equal, round, and reactive to light.  Neck: Neck supple.  Cardiovascular: Normal rate, regular rhythm and normal heart sounds.   No murmur heard. Pulmonary/Chest: Effort normal and breath sounds normal.  Abdominal: Soft. Bowel sounds are normal. She exhibits no distension. There is no tenderness.  Musculoskeletal: She exhibits no edema.  Neurological: She is alert and oriented to person, place, and time.  Fluent speech  Skin: Skin is warm and dry.  Psychiatric: She has a  normal mood and affect. Judgment normal.  Nursing note and vitals reviewed.   ED Course  Procedures (including critical care time) Labs Review Labs Reviewed  COMPREHENSIVE METABOLIC PANEL - Abnormal; Notable for the following:    CO2 21 (*)    Glucose, Bld 236 (*)    Creatinine, Ser 1.01 (*)    Calcium 8.2 (*)    All other components within normal limits  URINALYSIS, ROUTINE W REFLEX MICROSCOPIC (NOT AT [facility]) - Abnormal; Notable for the following:    APPearance CLOUDY (*)    Hgb urine dipstick TRACE (*)    Ketones, ur 15 (*)    Protein, ur 30 (*)    Nitrite POSITIVE (*)    Leukocytes, UA MODERATE (*)    All other components within normal limits  URINE MICROSCOPIC-ADD ON - Abnormal; Notable for the following:    Squamous Epithelial / LPF 0-5 (*)    Bacteria, UA MANY (*)    All other components within normal limits  URINE CULTURE  LIPASE, BLOOD  CBC    Imaging Review No results found. I have personally reviewed and evaluated these lab results as part of my medical decision-making.   EKG Interpretation   Date/Time:  Friday March 07 2015 14:56:24 EST Ventricular Rate:  82 PR Interval:  156 QRS Duration: 82 QT Interval:  386 QTC Calculation: 450 R Axis:   -61 Text Interpretation:  Normal sinus rhythm Left axis deviation Septal   infarct , age undetermined Abnormal ECG Poor R wave progression through  precordial leads Confirmed by Fabienne Nolasco MD, Ziasia Lenoir [id number]) on 03/07/2015  6:49:47 PM     Medications  promethazine (PHENERGAN) injection 12.5 mg (12.5 mg Intravenous Given 03/07/15 1938)  sodium chloride 0.9 % bolus 1,000 mL (0 mLs Intravenous Stopped 03/07/15 2116)  ciprofloxacin (CIPRO) tablet 500 mg (500 mg Oral Given 03/07/15 2305)    MDM   Final diagnoses:  Epigastric pain  Acute cystitis without hematuria   patient with 2 weeks of intermittent upper abdominal pain associated with nausea and vomiting. She was well-appearing at presentation with reassuring vital signs. No abdominal tenderness on exam. Lab work shows glucose 236, normal creatinine, unremarkable LFTs and bilirubin. Gave the patient an IV fluid bolus and Phenergan as she reported ongoing nausea despite receiving oral Zofran. EKG obtained from triage showed no acute ischemic changes. Given the patient's significant ibuprofen use, I suspect peptic ulcer disease. She has no tenderness on exam therefore I do not feel she needs any imaging at this time. She has a UTI on lab work. Since culture, gave him Cipro in ED, and gave prescription for Cipro to use at home.  Reexamination, the patient is well-appearing and has been able to tolerate ginger ale here. I've discussed treatment including PPI and sulcrafate, d/c all NSAIDs, and close follow-up with primary care provider for GI referral. Return precautions reviewed and patient voiced understanding of plan.    Sharlett Iles, MD 03/07/15 [phone]

## 2015-03-07 NOTE — ED Notes (Signed)
Pt reports onset this am of upper abd pain and n/v this am. Reports chest pain after vomiting. Denies diarrhea.

## 2015-03-10 LAB — URINE CULTURE: Culture: >=100000

## 2015-08-21 ENCOUNTER — Observation Stay (HOSPITAL_COMMUNITY)
Admission: EM | Admit: 2015-08-21 | Discharge: 2015-08-23 | Disposition: A | Discharge status: Home / Self Care | Payer: BC Managed Care – PPO | Attending: Internal Medicine | Admitting: Internal Medicine

## 2015-08-21 ENCOUNTER — Observation Stay (HOSPITAL_COMMUNITY): Payer: BC Managed Care – PPO

## 2015-08-21 ENCOUNTER — Encounter (HOSPITAL_COMMUNITY): Payer: Self-pay | Admitting: Emergency Medicine

## 2015-08-21 DIAGNOSIS — Z7984 Long term (current) use of oral hypoglycemic drugs: Secondary | ICD-10-CM | POA: Insufficient documentation

## 2015-08-21 DIAGNOSIS — I10 Essential (primary) hypertension: Secondary | ICD-10-CM | POA: Diagnosis not present

## 2015-08-21 DIAGNOSIS — K529 Noninfective gastroenteritis and colitis, unspecified: Secondary | ICD-10-CM | POA: Diagnosis not present

## 2015-08-21 DIAGNOSIS — E7800 Pure hypercholesterolemia, unspecified: Secondary | ICD-10-CM | POA: Diagnosis not present

## 2015-08-21 DIAGNOSIS — E860 Dehydration: Secondary | ICD-10-CM | POA: Diagnosis not present

## 2015-08-21 DIAGNOSIS — E119 Type 2 diabetes mellitus without complications: Secondary | ICD-10-CM | POA: Diagnosis not present

## 2015-08-21 DIAGNOSIS — Z85850 Personal history of malignant neoplasm of thyroid: Secondary | ICD-10-CM | POA: Diagnosis not present

## 2015-08-21 DIAGNOSIS — Z9049 Acquired absence of other specified parts of digestive tract: Secondary | ICD-10-CM | POA: Insufficient documentation

## 2015-08-21 DIAGNOSIS — R52 Pain, unspecified: Secondary | ICD-10-CM

## 2015-08-21 DIAGNOSIS — Z79899 Other long term (current) drug therapy: Secondary | ICD-10-CM | POA: Diagnosis not present

## 2015-08-21 DIAGNOSIS — E039 Hypothyroidism, unspecified: Secondary | ICD-10-CM

## 2015-08-21 DIAGNOSIS — R112 Nausea with vomiting, unspecified: Secondary | ICD-10-CM | POA: Diagnosis present

## 2015-08-21 DIAGNOSIS — M1990 Unspecified osteoarthritis, unspecified site: Secondary | ICD-10-CM | POA: Diagnosis not present

## 2015-08-21 LAB — URINALYSIS, ROUTINE W REFLEX MICROSCOPIC
Bilirubin Urine: NEGATIVE
GLUCOSE, UA: NEGATIVE mg/dL
HGB URINE DIPSTICK: NEGATIVE
Ketones, ur: NEGATIVE mg/dL
Leukocytes, UA: NEGATIVE
Nitrite: NEGATIVE
PH: 6.5 (ref 5.0–8.0)
PROTEIN: 100 mg/dL — AB
Specific Gravity, Urine: 1.016 (ref 1.005–1.030)

## 2015-08-21 LAB — COMPREHENSIVE METABOLIC PANEL
ALBUMIN: 3.6 g/dL (ref 3.5–5.0)
ALT: 21 U/L (ref 14–54)
AST: 26 U/L (ref 15–41)
Alkaline Phosphatase: 62 U/L (ref 38–126)
Anion gap: 12 (ref 5–15)
BUN: 8 mg/dL (ref 6–20)
CHLORIDE: 104 mmol/L (ref 101–111)
CO2: 22 mmol/L (ref 22–32)
CREATININE: 0.86 mg/dL (ref 0.44–1.00)
Calcium: 7.7 mg/dL — ABNORMAL LOW (ref 8.9–10.3)
GFR calc Af Amer: >60 mL/min (ref >60)
GFR calc non Af Amer: >60 mL/min (ref >60)
GLUCOSE: 211 mg/dL — AB (ref 65–99)
POTASSIUM: 3.7 mmol/L (ref 3.5–5.1)
SODIUM: 138 mmol/L (ref 135–145)
Total Bilirubin: 0.8 mg/dL (ref 0.3–1.2)
Total Protein: 7.1 g/dL (ref 6.5–8.1)

## 2015-08-21 LAB — CBC
HEMATOCRIT: 39.5 % (ref 36.0–46.0)
HEMATOCRIT: 36.7 % (ref 36.0–46.0)
HEMOGLOBIN: 11.7 g/dL — AB (ref 12.0–15.0)
Hemoglobin: 12.8 g/dL (ref 12.0–15.0)
MCH: 28.1 pg (ref 26.0–34.0)
MCH: 27.7 pg (ref 26.0–34.0)
MCHC: 32.4 g/dL (ref 30.0–36.0)
MCHC: 31.9 g/dL (ref 30.0–36.0)
MCV: 86.8 fL (ref 78.0–100.0)
MCV: 87 fL (ref 78.0–100.0)
PLATELETS: 232 10*3/uL (ref 150–400)
PLATELETS: 206 10*3/uL (ref 150–400)
RBC: 4.55 MIL/uL (ref 3.87–5.11)
RBC: 4.22 MIL/uL (ref 3.87–5.11)
RDW: 13.6 % (ref 11.5–15.5)
RDW: 13.8 % (ref 11.5–15.5)
WBC: 6.7 10*3/uL (ref 4.0–10.5)
WBC: 5.6 10*3/uL (ref 4.0–10.5)

## 2015-08-21 LAB — CREATININE, SERUM
Creatinine, Ser: 0.79 mg/dL (ref 0.44–1.00)
GFR calc non Af Amer: >60 mL/min (ref >60)

## 2015-08-21 LAB — URINE MICROSCOPIC-ADD ON: RBC / HPF: NONE SEEN RBC/hpf (ref 0–5)

## 2015-08-21 LAB — LIPASE, BLOOD: Lipase: 25 U/L (ref 11–51)

## 2015-08-21 LAB — TROPONIN I

## 2015-08-21 LAB — GLUCOSE, CAPILLARY
GLUCOSE-CAPILLARY: 193 mg/dL — AB (ref 65–99)
Glucose-Capillary: 178 mg/dL — ABNORMAL HIGH (ref 65–99)

## 2015-08-21 LAB — LACTIC ACID, PLASMA
Lactic Acid, Venous: 2.3 mmol/L (ref 0.5–2.0)
Lactic Acid, Venous: 1.5 mmol/L (ref 0.5–2.0)

## 2015-08-21 MED ORDER — ATENOLOL 50 MG PO TABS
50.0000 mg | ORAL_TABLET | Freq: Two times a day (BID) | ORAL | Status: DC
  Administered 2015-08-21 – 2015-08-23 (×4): 50 mg via ORAL
  Filled 2015-08-21 (×4): qty 1

## 2015-08-21 MED ORDER — ONDANSETRON HCL 4 MG PO TABS: 4.0000 mg | ORAL_TABLET | Freq: Four times a day (QID) | ORAL | Status: DC | PRN

## 2015-08-21 MED ORDER — RISAQUAD PO CAPS
2.0000 | ORAL_CAPSULE | Freq: Every day | ORAL | Status: DC
  Administered 2015-08-22 – 2015-08-23 (×2): 2 via ORAL
  Filled 2015-08-21 (×2): qty 2

## 2015-08-21 MED ORDER — ONDANSETRON 4 MG PO TBDP
4.0000 mg | ORAL_TABLET | Freq: Once | ORAL | Status: AC | PRN
  Administered 2015-08-21: 4 mg via ORAL

## 2015-08-21 MED ORDER — SODIUM CHLORIDE 0.9 % IV BOLUS (SEPSIS)
1000.0000 mL | Freq: Once | INTRAVENOUS | Status: AC
  Administered 2015-08-21: 1000 mL via INTRAVENOUS

## 2015-08-21 MED ORDER — LISINOPRIL 10 MG PO TABS
10.0000 mg | ORAL_TABLET | Freq: Every day | ORAL | Status: DC
  Administered 2015-08-21 – 2015-08-23 (×3): 10 mg via ORAL
  Filled 2015-08-21 (×3): qty 1

## 2015-08-21 MED ORDER — PROBIOTIC PO CAPS: ORAL_CAPSULE | Freq: Every day | ORAL | Status: DC

## 2015-08-21 MED ORDER — PRAVASTATIN SODIUM 20 MG PO TABS
20.0000 mg | ORAL_TABLET | Freq: Every day | ORAL | Status: DC
  Administered 2015-08-21 – 2015-08-22 (×2): 20 mg via ORAL
  Filled 2015-08-21 (×2): qty 1

## 2015-08-21 MED ORDER — DEXTROSE 5 % IV SOLN: 2.0000 g | Freq: Three times a day (TID) | INTRAVENOUS | Status: DC

## 2015-08-21 MED ORDER — DICLOFENAC SODIUM 3 % TD GEL: 1.0000 "application " | Freq: Every day | TRANSDERMAL | Status: DC | PRN

## 2015-08-21 MED ORDER — CALCIUM CARBONATE-VITAMIN D 500-200 MG-UNIT PO TABS
2.0000 | ORAL_TABLET | Freq: Three times a day (TID) | ORAL | Status: DC
  Administered 2015-08-21 – 2015-08-23 (×5): 2 via ORAL
  Filled 2015-08-21 (×3): qty 2
  Filled 2015-08-21: qty 1
  Filled 2015-08-21: qty 2

## 2015-08-21 MED ORDER — ALBUTEROL SULFATE (2.5 MG/3ML) 0.083% IN NEBU: 2.5000 mg | INHALATION_SOLUTION | RESPIRATORY_TRACT | Status: DC | PRN

## 2015-08-21 MED ORDER — SODIUM CHLORIDE 0.9 % IV SOLN
INTRAVENOUS | Status: DC
  Administered 2015-08-21 – 2015-08-23 (×3): via INTRAVENOUS

## 2015-08-21 MED ORDER — LEVOTHYROXINE SODIUM 100 MCG PO TABS
300.0000 ug | ORAL_TABLET | Freq: Every day | ORAL | Status: DC
  Administered 2015-08-22 – 2015-08-23 (×2): 300 ug via ORAL
  Filled 2015-08-21 (×3): qty 3

## 2015-08-21 MED ORDER — METOCLOPRAMIDE HCL 5 MG/ML IJ SOLN
10.0000 mg | Freq: Once | INTRAMUSCULAR | Status: AC
Start: 2015-08-21 — End: 2015-08-21
  Administered 2015-08-21: 10 mg via INTRAVENOUS
  Filled 2015-08-21: qty 2

## 2015-08-21 MED ORDER — FUROSEMIDE 80 MG PO TABS
80.0000 mg | ORAL_TABLET | Freq: Two times a day (BID) | ORAL | Status: DC
  Administered 2015-08-21 – 2015-08-23 (×4): 80 mg via ORAL
  Filled 2015-08-21 (×4): qty 1

## 2015-08-21 MED ORDER — ACETAMINOPHEN 325 MG PO TABS
650.0000 mg | ORAL_TABLET | Freq: Once | ORAL | Status: AC
  Administered 2015-08-21: 650 mg via ORAL
  Filled 2015-08-21: qty 2

## 2015-08-21 MED ORDER — DEXTROSE 5 % IV SOLN
2.0000 g | Freq: Three times a day (TID) | INTRAVENOUS | Status: DC
  Administered 2015-08-22 – 2015-08-23 (×4): 2 g via INTRAVENOUS
  Filled 2015-08-21 (×6): qty 2

## 2015-08-21 MED ORDER — ONDANSETRON 4 MG PO TBDP
ORAL_TABLET | ORAL | Status: AC
  Filled 2015-08-21: qty 1

## 2015-08-21 MED ORDER — HEPARIN SODIUM (PORCINE) 5000 UNIT/ML IJ SOLN
5000.0000 [IU] | Freq: Three times a day (TID) | INTRAMUSCULAR | Status: DC
  Administered 2015-08-21 – 2015-08-22 (×4): 5000 [IU] via SUBCUTANEOUS
  Filled 2015-08-21 (×4): qty 1

## 2015-08-21 MED ORDER — PANTOPRAZOLE SODIUM 40 MG PO TBEC
40.0000 mg | DELAYED_RELEASE_TABLET | Freq: Every day | ORAL | Status: DC
  Administered 2015-08-21 – 2015-08-23 (×3): 40 mg via ORAL
  Filled 2015-08-21 (×3): qty 1

## 2015-08-21 MED ORDER — ONDANSETRON HCL 4 MG/2ML IJ SOLN
4.0000 mg | Freq: Four times a day (QID) | INTRAMUSCULAR | Status: DC | PRN
  Administered 2015-08-22 – 2015-08-23 (×2): 4 mg via INTRAVENOUS
  Filled 2015-08-21 (×2): qty 2

## 2015-08-21 MED ORDER — INSULIN ASPART 100 UNIT/ML ~~LOC~~ SOLN
0.0000 [IU] | Freq: Three times a day (TID) | SUBCUTANEOUS | Status: DC
  Administered 2015-08-22: 3 [IU] via SUBCUTANEOUS
  Administered 2015-08-22: 5 [IU] via SUBCUTANEOUS
  Administered 2015-08-22 – 2015-08-23 (×3): 3 [IU] via SUBCUTANEOUS

## 2015-08-21 MED ORDER — ONDANSETRON HCL 4 MG/2ML IJ SOLN: 4.0000 mg | Freq: Three times a day (TID) | INTRAMUSCULAR | Status: DC | PRN

## 2015-08-21 MED ORDER — SODIUM CHLORIDE 0.9 % IV SOLN
1000.0000 mL | INTRAVENOUS | Status: DC
  Administered 2015-08-21: 1000 mL via INTRAVENOUS

## 2015-08-21 MED ORDER — ACETAMINOPHEN 650 MG RE SUPP: 650.0000 mg | Freq: Four times a day (QID) | RECTAL | Status: DC | PRN

## 2015-08-21 MED ORDER — ACETAMINOPHEN 325 MG PO TABS
650.0000 mg | ORAL_TABLET | Freq: Four times a day (QID) | ORAL | Status: DC | PRN
Start: 2015-08-21 — End: 2015-08-23
  Administered 2015-08-21 – 2015-08-22 (×3): 650 mg via ORAL
  Filled 2015-08-21 (×3): qty 2

## 2015-08-21 MED ORDER — POTASSIUM CHLORIDE CRYS ER 20 MEQ PO TBCR
40.0000 meq | EXTENDED_RELEASE_TABLET | Freq: Every day | ORAL | Status: DC
  Administered 2015-08-21 – 2015-08-23 (×3): 40 meq via ORAL
  Filled 2015-08-21 (×3): qty 2

## 2015-08-21 MED ORDER — SACCHAROMYCES BOULARDII 250 MG PO CAPS
250.0000 mg | ORAL_CAPSULE | Freq: Every day | ORAL | Status: DC
  Administered 2015-08-22 – 2015-08-23 (×2): 250 mg via ORAL
  Filled 2015-08-21 (×2): qty 1

## 2015-08-21 MED ORDER — ASPIRIN EC 325 MG PO TBEC
325.0000 mg | DELAYED_RELEASE_TABLET | Freq: Every day | ORAL | Status: DC
  Administered 2015-08-21 – 2015-08-23 (×3): 325 mg via ORAL
  Filled 2015-08-21 (×3): qty 1

## 2015-08-21 NOTE — Progress Notes (Signed)
Pharmacy Antibiotic Note [name] is a 55 y.o. female admitted on 08/21/2015 with sepsis/UTI in setting of recent UTI that was treated with two courses of outpatient antibiotics. Pharmacy has been consulted for Azactam dosing.  Plan: 1. Azactam 2 grams IV every 8 hours  2. Follow up culture data and narrow abx as feasible   Height: 5\' 6"  (167.6 cm) Weight: 253 lb (114.76 kg) IBW/kg (Calculated) : 59.3  Temp (24hrs), Avg:100 F (37.8 C), Min:99 F (37.2 C), Max:100.9 F (38.3 C)   Recent Labs Lab 08/21/15 1248 08/21/15 1546  WBC 6.7  --   CREATININE 0.86  --   LATICACIDVEN  --  2.3*    Estimated Creatinine Clearance: 97.3 mL/min (by C-G formula based on Cr of 0.86).    Allergies  Allergen Reactions  . Lorabid [Loracarbef] Anaphylaxis  . Codeine Nausea And Vomiting  . Sulfa Drugs Cross Reactors Other (See Comments)    Severe allergic reaction as a child - was not told symptoms  . Benicar [Olmesartan Medoxomil] Swelling and Rash  . Other Rash    Green peas    Antimicrobials this admission: 5/25 Azactam >>    Dose adjustments this admission: n/a   Microbiology results: Px  appears to chronically grow E.coli for UCx's    Thank you for allowing pharmacy to be a part of this patient's care.  Vincenza Hews, PharmD, BCPS 08/21/2015, 6:22 PM Pager: [phone]

## 2015-08-21 NOTE — ED Provider Notes (Signed)
CSN: [id number]     Arrival date & time 08/21/15  1230 History   First MD Initiated Contact with Patient 08/21/15 1306     Chief Complaint  Patient presents with  . Vomiting     (Consider location/radiation/quality/duration/timing/severity/associated sxs/prior Treatment) HPI Comments: Patient is a 55 year old female with history of type 2 diabetes, thyroid cancer who presents with nausea, vomiting, diarrhea since 4 AM this morning. Patient states she is feeling intermittent nausea for the past 3 days, but did not vomit. Patient has vomited 6-7 times since 4 AM this morning, mostly bile. She denies any hematemesis or bloody diarrhea. Patient has had associated crampy abdominal pain, cramping in her thighs and arms, and generalized body aches. Patient states she has chronic UTIs and she is currently about to finish antibiotic that is unknown to her for a urine culture that grew Escherichia coli week and a half ago. Patient states this is the second antibiotic she has been on for this UTI. Patient denies chest pain, shortness of breath, dysuria at this time.  The history is provided by the patient.    Past Medical History  Diagnosis Date  . Diabetes mellitus   . Hypertension   . Hypercholesteremia   . Heart murmur   . Complication of anesthesia   . PONV (postoperative nausea and vomiting)   . 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 Family history of adverse reaction to anesthesia     difficult for father to wake after surgery  . Dysrhythmia     " irregular heart rate per patient"  . Asthma     due to allergies   . Anemia     hx of   . Thyroid disease   . Cancer [facility])    Past Surgical History  Procedure Laterality Date  . Colon surgery    . Abdominal hysterectomy    . Tonsillectomy    . Knee arthroscopy    . Cholecystectomy    . Left heart catheterization with coronary angiogram N/A 04/24/2014    Procedure: LEFT HEART CATHETERIZATION WITH CORONARY ANGIOGRAM;  Surgeon: Burnell Blanks, MD;  Location: [facility] CATH LAB;  Service: Cardiovascular;  Laterality: N/A;  . Thyroidectomy N/A 09/20/2014    Procedure: TOTAL THYROIDECTOMY;  Surgeon: Armandina Gemma, MD;  Location: [facility] ORS;  Service: General;  Laterality: N/A;   Family History  Problem Relation Age of Onset  . Heart failure Mother   . Stroke Father   . Heart failure Father   . Diabetes Mother   . Cancer Father    Social History  Substance Use Topics  . Smoking status: Never Smoker   . Smokeless tobacco: Never Used  . Alcohol Use: No   OB History    No data available     Review of Systems  Constitutional: Negative for fever and chills.  HENT: Negative for facial swelling and sore throat.   Respiratory: Negative for shortness of breath.   Cardiovascular: Negative for chest pain.  Gastrointestinal: Positive for nausea, vomiting, abdominal pain and diarrhea.  Genitourinary: Negative for dysuria.  Musculoskeletal: Positive for myalgias. Negative for back pain.  Skin: Negative for rash and wound.  Neurological: Negative for headaches.  Psychiatric/Behavioral: The patient is not nervous/anxious.       Allergies  Lorabid; Codeine; Sulfa drugs cross reactors; Benicar; and Other  Home Medications   Prior to Admission medications   Medication Sig Start Date End Date Taking? Authorizing Provider  acetaminophen (TYLENOL)  650 MG CR tablet Take 650 mg by mouth every 8 (eight) hours as needed for pain.    Yes Historical Provider, MD  albuterol (PROVENTIL HFA;VENTOLIN HFA) 108 (90 BASE) MCG/ACT inhaler Inhale 2 puffs into the lungs every 4 (four) hours as needed for wheezing or shortness of breath. 09/07/13  Yes Leeanne Rio, MD  aspirin EC 325 MG tablet Take 325 mg by mouth daily.   Yes Historical Provider, MD  atenolol (TENORMIN) 50 MG tablet Take 50 mg by mouth 2 (two) times daily.   Yes Historical Provider, MD  calcium-vitamin D (OSCAL WITH D) 500-200 MG-UNIT per tablet Take 1 tablet by mouth 3 (three)  times daily. Patient taking differently: Take 2 tablets by mouth 3 (three) times daily.  09/20/14  Yes Armandina Gemma, MD  Diclofenac Sodium 3 % GEL Place 1 application onto the skin daily as needed (ankle pain).   Yes Historical Provider, MD  furosemide (LASIX) 40 MG tablet Take 80 mg by mouth 2 (two) times daily.   Yes Historical Provider, MD  lansoprazole (PREVACID) 15 MG capsule Take 1 capsule (15 mg total) by mouth daily at 12 noon. 03/07/15  Yes Sharlett Iles, MD  levothyroxine (SYNTHROID, LEVOTHROID) 300 MCG tablet Take 300 mcg by mouth daily before breakfast.   Yes Historical Provider, MD  lisinopril (PRINIVIL,ZESTRIL) 10 MG tablet Take 10 mg by mouth daily.  09/09/14  Yes Historical Provider, MD  metFORMIN (GLUCOPHAGE) 1000 MG tablet Take 1 tablet (1,000 mg total) by mouth 2 (two) times daily with a meal. 04/29/14  Yes Maryann Mikhail, DO  potassium chloride SA (K-DUR,KLOR-CON) 20 MEQ tablet Take 40 mEq by mouth 2 (two) times daily.    Yes Historical Provider, MD  pravastatin (PRAVACHOL) 20 MG tablet Take 20 mg by mouth daily.  06/25/13  Yes Historical Provider, MD  Probiotic Product (PROBIOTIC PO) Take 1 tablet by mouth daily.   Yes Historical Provider, MD  ciprofloxacin (CIPRO) 500 MG tablet Take 1 tablet (500 mg total) by mouth 2 (two) times daily. 03/07/15   Sharlett Iles, MD  furosemide (LASIX) 80 MG tablet Take 80 mg by mouth 2 (two) times daily.    Historical Provider, MD  levothyroxine (SYNTHROID, LEVOTHROID) 200 MCG tablet Take 200-400 mcg by mouth See admin instructions. TK 1 T PO DAILY ON MONDAY THRU FRIDAY AND 2 TS PO DAILY ON SAT. AND SUNDAY 02/10/15   Historical Provider, MD  ondansetron (ZOFRAN-ODT) 8 MG disintegrating tablet Take 8 mg by mouth every 8 (eight) hours as needed for nausea or vomiting.    Historical Provider, MD  promethazine (PHENERGAN) 25 MG tablet Take 1 tablet (25 mg total) by mouth every 6 (six) hours as needed for nausea or vomiting. 03/07/15   Sharlett Iles, MD  sucralfate (CARAFATE) 1 G tablet Take 1 tablet (1 g total) by mouth 4 (four) times daily -  with meals and at bedtime. 03/07/15   Sharlett Iles, MD  SYNTHROID 100 MCG tablet Take 1 tablet (100 mcg total) by mouth daily. Patient not taking: Reported on 03/07/2015 09/20/14   Armandina Gemma, MD  temazepam (RESTORIL) 15 MG capsule Take 15 mg by mouth at bedtime. 01/25/15   Historical Provider, MD   BP 123/69 mmHg  Pulse 82  Temp(Src) 100.9 F (38.3 C) (Rectal)  Resp 27  Ht 5\' 6"  (1.676 m)  Wt 114.76 kg  BMI 40.85 kg/m2  SpO2 96% Physical Exam  Constitutional: She appears well-developed and well-nourished. No  distress.  HENT:  Head: Normocephalic and atraumatic.  Mouth/Throat: Oropharynx is clear and moist. No oropharyngeal exudate.  Eyes: Conjunctivae are normal. Pupils are equal, round, and reactive to light. Right eye exhibits no discharge. Left eye exhibits no discharge. No scleral icterus.  Neck: Normal range of motion. Neck supple. No thyromegaly present.  Cardiovascular: Normal rate, regular rhythm, normal heart sounds and intact distal pulses.  Exam reveals no gallop and no friction rub.   No murmur heard. Pulmonary/Chest: Effort normal and breath sounds normal. No stridor. No respiratory distress. She has no wheezes. She has no rales.  Abdominal: Soft. Bowel sounds are normal. She exhibits no distension. There is generalized tenderness. There is no rebound and no guarding.  Discomfort to palpation throughout, no focal tenderness  Musculoskeletal: She exhibits no edema.  Lymphadenopathy:    She has no cervical adenopathy.  Neurological: She is alert. Coordination normal.  Skin: Skin is warm and dry. No rash noted. She is not diaphoretic. No pallor.  Psychiatric: She has a normal mood and affect.  Nursing note and vitals reviewed.   ED Course  Procedures (including critical care time) Labs Review Labs Reviewed  COMPREHENSIVE METABOLIC PANEL - Abnormal;  Notable for the following:    Glucose, Bld 211 (*)    Calcium 7.7 (*)    All other components within normal limits  URINALYSIS, ROUTINE W REFLEX MICROSCOPIC (NOT AT [facility]) - Abnormal; Notable for the following:    Protein, ur 100 (*)    All other components within normal limits  LACTIC ACID, PLASMA - Abnormal; Notable for the following:    Lactic Acid, Venous 2.3 (*)    All other components within normal limits  URINE MICROSCOPIC-ADD ON - Abnormal; Notable for the following:    Squamous Epithelial / LPF 0-5 (*)    Bacteria, UA RARE (*)    All other components within normal limits  CULTURE, BLOOD (ROUTINE X 2)  CULTURE, BLOOD (ROUTINE X 2)  URINE CULTURE  GASTROINTESTINAL PANEL BY PCR, STOOL (REPLACES STOOL CULTURE)  C DIFFICILE QUICK SCREEN W PCR REFLEX  LIPASE, BLOOD  CBC  LACTIC ACID, PLASMA  TROPONIN I    Imaging Review No results found. I have personally reviewed and evaluated these images and lab results as part of my medical decision-making.   EKG Interpretation   Date/Time:  Thursday Aug 21 2015 17:08:13 EDT Ventricular Rate:  85 PR Interval:  161 QRS Duration: 88 QT Interval:  368 QTC Calculation: 438 R Axis:   -50 Text Interpretation:  Sinus rhythm Left anterior fascicular block Low  voltage, precordial leads Probable anteroseptal infarct, old No  significant change since last tracing Confirmed by JACUBOWITZ  MD, SAM  [phone]) on 08/21/2015 5:30:18 PM      MDM   Rectal temperature 100.9. Lactic acid 2.3 after 1 L of fluid. UA shows 100 protein, rare bacteria. CMP shows glucose 211, calcium 7.7. CBC unremarkable. Blood cultures taken. Code sepsis called. Due to patient's current antibiotic treatment, we will not start antibiotics in ED. Patient hemodynamically stable and eating in ED. Pain and nausea controlled in ED. I consulted the hospitalist, Dr. Waldron [facility], who will admit the patient for observation. Antibiotics initiated by Dr. Waldron [facility].  Final  diagnoses:  Non-intractable vomiting with nausea, vomiting of unspecified type  Body aches      Frederica Kuster, PA-C 08/21/15 [place], MD 08/22/15 [phone]

## 2015-08-21 NOTE — ED Notes (Signed)
Pt sts N/V/D starting this am 

## 2015-08-21 NOTE — ED Provider Notes (Signed)
Complains of vomiting and diarrhea for the past 2 days. She also complains of generalized body aches and low back pain for the past 2 weeks. She's been treated for urinary tract infection. By her primary care physician. On exam patient is alert nontoxic Glasgow Coma Score 15 appears in no distress lungs clear auscultation heart regular rate and rhythm abdomen obese normal active bowel sounds nontender back is without point tenderness. Neurologic moves all extremities well and is 2 through 12 intact motor strength 5 over 5 overall  Orlie Dakin, MD 08/22/15 [phone]

## 2015-08-21 NOTE — ED Notes (Signed)
[facility] contacted to page Code Sepsis 

## 2015-08-21 NOTE — H&P (Addendum)
TRH H&P   Patient Demographics:    [name], is a 55 y.o. female  MRN: [id number]   DOB - [birth year]  Admit Date - 08/21/2015  Outpatient Primary MD for the patient is Kristine Garbe, MD  Referring MD/NP/PA: PA Law  Patient coming from: Home  Chief Complaint  Patient presents with  . Vomiting      HPI:    [name]  is a 55 y.o. female, With past medical history of type 2 diabetes, thyroid cancer status post resection, hypertension, presents with complaints of abdominal pain, nausea, vomiting, diarrhea, started 4 AM, reports take 6-7 watery bowel movement, as well as vomited 6-7 times, denies any coffee-ground emesis, any bright red blood per rectum or melena, as well reports generalized weakness, having chills, patient reports she has been recently treated for UTI by PCP, she finished 1 week of antibiotic, cannot recall the name, was called again by PCP for cultures growing Escherichia coli where she will was prescribed another week of different antibiotic, they would've been her last day. - In ED workup significant for mildly elevated lactic acid at 2.3, otherwise no significant labs abnormalities, negative urinalysis, patient with fever 100.9, hospitalist requested to admit.    Review of systems:    In addition to the HPI above, reportsFever-chills, No Headache, No changes with Vision or hearing, No problems swallowing food or Liquids, No Chest pain, Cough or Shortness of Breath, reports Abdominal pain,  Nausea and  Vommitting, as well Diarrhea  No Blood in stool or Urine, No dysuria, No new skin rashes or bruises, No new joints pains-aches,  No new weakness, tingling, numbness in any extremity, No recent weight gain or loss, No polyuria, polydypsia or polyphagia, No significant Mental Stressors.  A full 10 point Review of Systems was done, except as stated  above, all other Review of Systems were negative.   With Past History of the following :    Past Medical History  Diagnosis Date  . Diabetes mellitus   . Hypertension   . Hypercholesteremia   . Heart murmur   . Complication of anesthesia   . PONV (postoperative nausea and vomiting)   . 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 Family history of adverse reaction to anesthesia     difficult for father to wake after surgery  . Dysrhythmia     " irregular heart rate per patient"  . Asthma     due to allergies   . Anemia     hx of   . Thyroid disease   . Cancer [facility])       Past Surgical History  Procedure Laterality Date  . Colon surgery    . Abdominal hysterectomy    . Tonsillectomy    . Knee arthroscopy    . Cholecystectomy    . Left heart catheterization with coronary angiogram  N/A 04/24/2014    Procedure: LEFT HEART CATHETERIZATION WITH CORONARY ANGIOGRAM;  Surgeon: Burnell Blanks, MD;  Location: [facility] CATH LAB;  Service: Cardiovascular;  Laterality: N/A;  . Thyroidectomy N/A 09/20/2014    Procedure: TOTAL THYROIDECTOMY;  Surgeon: Armandina Gemma, MD;  Location: [facility] ORS;  Service: General;  Laterality: N/A;      Social History:     Social History  Substance Use Topics  . Smoking status: Never Smoker   . Smokeless tobacco: Never Used  . Alcohol Use: No     Lives - At home  Mobility - independent     Family History :     Family History  Problem Relation Age of Onset  . Heart failure Mother   . Stroke Father   . Heart failure Father   . Diabetes Mother   . Cancer Father       Home Medications:   Prior to Admission medications   Medication Sig Start Date End Date Taking? Authorizing Provider  acetaminophen (TYLENOL) 650 MG CR tablet Take 650 mg by mouth every 8 (eight) hours as needed for pain.    Yes Historical Provider, MD  albuterol (PROVENTIL HFA;VENTOLIN HFA) 108 (90 BASE) MCG/ACT inhaler Inhale 2 puffs into the lungs every 4 (four) hours as needed for  wheezing or shortness of breath. 09/07/13  Yes Leeanne Rio, MD  aspirin EC 325 MG tablet Take 325 mg by mouth daily.   Yes Historical Provider, MD  atenolol (TENORMIN) 50 MG tablet Take 50 mg by mouth 2 (two) times daily.   Yes Historical Provider, MD  calcium-vitamin D (OSCAL WITH D) 500-200 MG-UNIT per tablet Take 1 tablet by mouth 3 (three) times daily. Patient taking differently: Take 2 tablets by mouth 3 (three) times daily.  09/20/14  Yes Armandina Gemma, MD  Diclofenac Sodium 3 % GEL Place 1 application onto the skin daily as needed (ankle pain).   Yes Historical Provider, MD  furosemide (LASIX) 40 MG tablet Take 80 mg by mouth 2 (two) times daily.   Yes Historical Provider, MD  lansoprazole (PREVACID) 15 MG capsule Take 1 capsule (15 mg total) by mouth daily at 12 noon. 03/07/15  Yes Sharlett Iles, MD  levothyroxine (SYNTHROID, LEVOTHROID) 300 MCG tablet Take 300 mcg by mouth daily before breakfast.   Yes Historical Provider, MD  lisinopril (PRINIVIL,ZESTRIL) 10 MG tablet Take 10 mg by mouth daily.  09/09/14  Yes Historical Provider, MD  metFORMIN (GLUCOPHAGE) 1000 MG tablet Take 1 tablet (1,000 mg total) by mouth 2 (two) times daily with a meal. 04/29/14  Yes Maryann Mikhail, DO  potassium chloride SA (K-DUR,KLOR-CON) 20 MEQ tablet Take 40 mEq by mouth 2 (two) times daily.    Yes Historical Provider, MD  pravastatin (PRAVACHOL) 20 MG tablet Take 20 mg by mouth daily.  06/25/13  Yes Historical Provider, MD  Probiotic Product (PROBIOTIC PO) Take 1 tablet by mouth daily.   Yes Historical Provider, MD  ciprofloxacin (CIPRO) 500 MG tablet Take 1 tablet (500 mg total) by mouth 2 (two) times daily. 03/07/15   Sharlett Iles, MD  furosemide (LASIX) 80 MG tablet Take 80 mg by mouth 2 (two) times daily.    Historical Provider, MD  levothyroxine (SYNTHROID, LEVOTHROID) 200 MCG tablet Take 200-400 mcg by mouth See admin instructions. TK 1 T PO DAILY ON MONDAY THRU FRIDAY AND 2 TS PO DAILY ON  SAT. AND SUNDAY 02/10/15   Historical Provider, MD  ondansetron (ZOFRAN-ODT) 8 MG disintegrating  tablet Take 8 mg by mouth every 8 (eight) hours as needed for nausea or vomiting.    Historical Provider, MD  promethazine (PHENERGAN) 25 MG tablet Take 1 tablet (25 mg total) by mouth every 6 (six) hours as needed for nausea or vomiting. 03/07/15   Sharlett Iles, MD  sucralfate (CARAFATE) 1 G tablet Take 1 tablet (1 g total) by mouth 4 (four) times daily -  with meals and at bedtime. 03/07/15   Sharlett Iles, MD  SYNTHROID 100 MCG tablet Take 1 tablet (100 mcg total) by mouth daily. Patient not taking: Reported on 03/07/2015 09/20/14   Armandina Gemma, MD  temazepam (RESTORIL) 15 MG capsule Take 15 mg by mouth at bedtime. 01/25/15   Historical Provider, MD     Allergies:     Allergies  Allergen Reactions  . Lorabid [Loracarbef] Anaphylaxis  . Codeine Nausea And Vomiting  . Sulfa Drugs Cross Reactors Other (See Comments)    Severe allergic reaction as a child - was not told symptoms  . Benicar [Olmesartan Medoxomil] Swelling and Rash  . Other Rash    Green peas     Physical Exam:   Vitals  Blood pressure 132/72, pulse 83, temperature 100.9 F (38.3 C), temperature source Rectal, resp. rate 19, height 5\' 6"  (1.676 m), weight 114.76 kg (253 lb), SpO2 95 %.   1. General Well-developed female lying in bed in NAD,    2. Normal affect and insight, Not Suicidal or Homicidal, Awake Alert, Oriented X 3.  3. No F.N deficits, ALL C.Nerves Intact, Strength 5/5 all 4 extremities, Sensation intact all 4 extremities, Plantars down going.  4. Ears and Eyes appear Normal, Conjunctivae clear, PERRLA. Dry Oral Mucosa.  5. Supple Neck, No JVD, No cervical lymphadenopathy appriciated, No Carotid Bruits.  6. Symmetrical Chest wall movement, Good air movement bilaterally, CTAB.  7. RRR, No Gallops, Rubs or Murmurs, No Parasternal Heave.  8. Positive Bowel Sounds, Abdomen Soft, minimal  epigastric tenderness, no CVA tenderness, but has bilateral lower back pain,No rebound -guarding or rigidity.  9.  No Cyanosis, delayed Skin Turgor, No Skin Rash or Bruise.  10. Good muscle tone,  joints appear normal , no effusions, Normal ROM.  11. No Palpable Lymph Nodes in Neck or Axillae     Data Review:    CBC  Recent Labs Lab 08/21/15 1248  WBC 6.7  HGB 12.8  HCT 39.5  PLT 232  MCV 86.8  MCH 28.1  MCHC 32.4  RDW 13.6   ------------------------------------------------------------------------------------------------------------------  Chemistries   Recent Labs Lab 08/21/15 1248  NA 138  K 3.7  CL 104  CO2 22  GLUCOSE 211*  BUN 8  CREATININE 0.86  CALCIUM 7.7*  AST 26  ALT 21  ALKPHOS 62  BILITOT 0.8   ------------------------------------------------------------------------------------------------------------------ estimated creatinine clearance is 97.3 mL/min (by C-G formula based on Cr of 0.86). ------------------------------------------------------------------------------------------------------------------ No results for input(s): TSH, T4TOTAL, T3FREE, THYROIDAB in the last 72 hours.  Invalid input(s): FREET3  Coagulation profile No results for input(s): INR, PROTIME in the last 168 hours. ------------------------------------------------------------------------------------------------------------------- No results for input(s): DDIMER in the last 72 hours. -------------------------------------------------------------------------------------------------------------------  Cardiac Enzymes No results for input(s): CKMB, TROPONINI, MYOGLOBIN in the last 168 hours.  Invalid input(s): CK ------------------------------------------------------------------------------------------------------------------    Component Value Date/Time   BNP 139.2* 04/23/2014 1308      ---------------------------------------------------------------------------------------------------------------  Urinalysis    Component Value Date/Time   COLORURINE YELLOW 08/21/2015 [place] 08/21/2015 1531  LABSPEC 1.016 08/21/2015 1531   PHURINE 6.5 08/21/2015 1531   GLUCOSEU NEGATIVE 08/21/2015 1531   HGBUR NEGATIVE 08/21/2015 1531   BILIRUBINUR NEGATIVE 08/21/2015 1531   KETONESUR NEGATIVE 08/21/2015 1531   PROTEINUR 100* 08/21/2015 1531   UROBILINOGEN 0.2 10/07/2014 1009   NITRITE NEGATIVE 08/21/2015 1531   LEUKOCYTESUR NEGATIVE 08/21/2015 1531    ----------------------------------------------------------------------------------------------------------------   Imaging Results:    No results found.     Assessment & Plan:    Active Problems:   Type 2 diabetes mellitus (HCC)   Hypercholesteremia   Essential hypertension   Gastroenteritis   Hypothyroid  Abdominal pain, nausea, vomiting, diarrhea -  this is most likely related to viral gastroenteritis, will obtain GI panel, will continue with hydration, continue to hold her Lasix. -  given she was on antibiotic course recently will check C. Difficile, and start on probiotics. - Given her fever, elevated lactic acid, will start on Azactam (given cephalosporin allergies ) to cover for urinary urinary source of infection (even though with negative urinalysis, but will follow on urine cultures as patient report and previous infection urinalysis was positive but culture is positive), but will have low threshold to discontinue antibiotics. - Given elevated lactic acid and fever, blood cultures were sent, will follow on urine cultures, will check KUB.  Diabetes mellitus - Continue to hold metformin, will start an insulin sliding scale  Hypertension - Blood pressure acceptable, continue with home medication  Hypercholesterolemia - Continue with statin  Hypothyroidism/status post subtotal  thyroidectomy - Continue with Synthroid  - Will Hold patient's Lasix(she is on 80 mg by mouth twice a day), as will decrease her by mouth potassium supplement by half as holding diuresis  DVT Prophylaxis Heparin -   AM Labs Ordered, also please review Full Orders  Family Communication: Admission, patients condition and plan of care including tests being ordered have been discussed with the patient and Husband who indicate understanding and agree with the plan and Code Status.  Code Status Full  Likely DC to  Home  Condition GUARDED    Consults called: none  Admission status: observation  Time spent in minutes : 55 minutes   ELGERGAWY, DAWOOD M.D on 08/21/2015 at 6:07 PM  Between 7am to 7pm - Pager - [phone]. After 7pm go to [email or web address] - password [facility]  [facility] - Office  [phone]

## 2015-08-22 DIAGNOSIS — K529 Noninfective gastroenteritis and colitis, unspecified: Secondary | ICD-10-CM | POA: Diagnosis not present

## 2015-08-22 DIAGNOSIS — I10 Essential (primary) hypertension: Secondary | ICD-10-CM | POA: Diagnosis not present

## 2015-08-22 DIAGNOSIS — E039 Hypothyroidism, unspecified: Secondary | ICD-10-CM | POA: Diagnosis not present

## 2015-08-22 LAB — CBC
HEMATOCRIT: 34 % — AB (ref 36.0–46.0)
Hemoglobin: 10.7 g/dL — ABNORMAL LOW (ref 12.0–15.0)
MCH: 27.9 pg (ref 26.0–34.0)
MCHC: 31.5 g/dL (ref 30.0–36.0)
MCV: 88.8 fL (ref 78.0–100.0)
Platelets: 175 10*3/uL (ref 150–400)
RBC: 3.83 MIL/uL — AB (ref 3.87–5.11)
RDW: 14.1 % (ref 11.5–15.5)
WBC: 4.4 10*3/uL (ref 4.0–10.5)

## 2015-08-22 LAB — C DIFFICILE QUICK SCREEN W PCR REFLEX
C DIFFICILE (CDIFF) INTERP: NEGATIVE
C DIFFICILE (CDIFF) TOXIN: NEGATIVE
C Diff antigen: NEGATIVE

## 2015-08-22 LAB — BASIC METABOLIC PANEL
Anion gap: 7 (ref 5–15)
BUN: 7 mg/dL (ref 6–20)
CHLORIDE: 107 mmol/L (ref 101–111)
CO2: 27 mmol/L (ref 22–32)
CREATININE: 0.96 mg/dL (ref 0.44–1.00)
Calcium: 7.4 mg/dL — ABNORMAL LOW (ref 8.9–10.3)
GFR calc Af Amer: >60 mL/min (ref >60)
GFR calc non Af Amer: >60 mL/min (ref >60)
GLUCOSE: 193 mg/dL — AB (ref 65–99)
Potassium: 3.4 mmol/L — ABNORMAL LOW (ref 3.5–5.1)
SODIUM: 141 mmol/L (ref 135–145)

## 2015-08-22 LAB — GLUCOSE, CAPILLARY
GLUCOSE-CAPILLARY: 159 mg/dL — AB (ref 65–99)
GLUCOSE-CAPILLARY: 177 mg/dL — AB (ref 65–99)
Glucose-Capillary: 203 mg/dL — ABNORMAL HIGH (ref 65–99)
Glucose-Capillary: 164 mg/dL — ABNORMAL HIGH (ref 65–99)

## 2015-08-22 LAB — URINE CULTURE: Culture: NO GROWTH

## 2015-08-22 MED ORDER — POTASSIUM CHLORIDE CRYS ER 20 MEQ PO TBCR
40.0000 meq | EXTENDED_RELEASE_TABLET | Freq: Once | ORAL | Status: AC
  Administered 2015-08-22: 40 meq via ORAL
  Filled 2015-08-22: qty 2

## 2015-08-22 MED ORDER — DIPHENHYDRAMINE HCL 25 MG PO CAPS
25.0000 mg | ORAL_CAPSULE | Freq: Once | ORAL | Status: AC
  Administered 2015-08-22: 25 mg via ORAL
  Filled 2015-08-22: qty 1

## 2015-08-22 NOTE — Progress Notes (Signed)
PROGRESS NOTE                                                                                                                                                                                                             Patient Demographics:    [name], is a 55 y.o. female, DOB - [birth year], [id number]  Admit date - 08/21/2015   Admitting Physician Albertine Patricia, MD  Outpatient Primary MD for the patient is Kristine Garbe, MD  LOS -   Chief Complaint  Patient presents with  . Vomiting       Brief Narrative   55 y.o. female, With past medical history of type 2 diabetes, thyroid cancer status post resection, hypertension, presents with complaints of abdominal pain, nausea, vomiting, diarrhea, febrile in ED 100.9, with elevated lactic acid, likely due to depletion and dehydration from viral gastroenteritis   Subjective:    [name] today has, No headache, No chest pain, No abdominal pain - No Nausea, reports few bowel movements, but soft, non-watery   Assessment  & Plan :    Active Problems:   Type 2 diabetes mellitus (HCC)   Hypercholesteremia   Essential hypertension   Gastroenteritis   Hypothyroid  Abdominal pain, nausea, vomiting, diarrhea - This is most likely related to viral gastroenteritis,  - follow on  GI panel,C diff - continue with hydration, continue to hold her Lasix. - Due to minimal pain, nausea and vomiting significantly improved. - Continue with Azactam for presumed UTI, follow on blood and urine culture - Lactic acid normalized  Diabetes mellitus - Continue to hold metformin, CBG acceptable on insulin sliding scale  Hypertension - Blood pressure acceptable, continue with home medication  Hypercholesterolemia - Continue with statin  Hypothyroidism/status post subtotal thyroidectomy - Continue with Synthroid  - Will Hold patient's Lasix(she is on 80 mg by mouth twice a day),   Code Status :  full  Family Communication  : Husband at bedside  Disposition Plan  : home in am  Consults  :  nnoe  Procedures  : none  DVT Prophylaxis  :  Heparin   Lab Results  Component Value Date   PLT 175 08/22/2015    Antibiotics  :    Anti-infectives    Start     Dose/Rate Route Frequency Ordered Stop   08/21/15  2200  aztreonam (AZACTAM) 2 g in dextrose 5 % 50 mL IVPB  Status:  Discontinued     2 g 100 mL/hr over 30 Minutes Intravenous Every 8 hours 08/21/15 1814 08/21/15 1814   08/21/15 1815  aztreonam (AZACTAM) 2 g in dextrose 5 % 50 mL IVPB     2 g 100 mL/hr over 30 Minutes Intravenous Every 8 hours 08/21/15 1814          Objective:   Filed Vitals:   08/21/15 2139 08/22/15 0508 08/22/15 0922 08/22/15 1458  BP: 125/83 146/64 138/66 136/70  Pulse: 79 66 66 57  Temp: 99.9 F (37.7 C) 98.8 F (37.1 C)  98.3 F (36.8 C)  TempSrc: Oral Oral    Resp: 21 16  18   Height:      Weight:      SpO2: 97% 98%  98%    Wt Readings from Last 3 Encounters:  08/21/15 114.76 kg (253 lb)  10/07/14 119.296 kg (263 lb)  09/20/14 116.121 kg (256 lb)     Intake/Output Summary (Last 24 hours) at 08/22/15 1633 Last data filed at 08/22/15 1040  Gross per 24 hour  Intake   2910 ml  Output      0 ml  Net   2910 ml     Physical Exam  Awake Alert, Oriented X 3, No new F.N deficits, Normal affect [place].AT,PERRAL Supple Neck,No JVD, No cervical lymphadenopathy appriciated.  Symmetrical Chest wall movement, Good air movement bilaterally, CTAB RRR,No Gallops,Rubs or new Murmurs, No Parasternal Heave +ve B.Sounds, Abd Soft, No tenderness, No organomegaly appriciated, No rebound - guarding or rigidity. No Cyanosis, Clubbing or edema, No new Rash or bruise      Data Review:    CBC  Recent Labs Lab 08/21/15 1248 08/21/15 1907 08/22/15 0612  WBC 6.7 5.6 4.4  HGB 12.8 11.7* 10.7*  HCT 39.5 36.7 34.0*  PLT 232 206 175  MCV 86.8 87.0 88.8  MCH 28.1 27.7 27.9  MCHC 32.4 31.9 31.5    RDW 13.6 13.8 14.1    Chemistries   Recent Labs Lab 08/21/15 1248 08/21/15 1907 08/22/15 0612  NA 138  --  141  K 3.7  --  3.4*  CL 104  --  107  CO2 22  --  27  GLUCOSE 211*  --  193*  BUN 8  --  7  CREATININE 0.86 0.79 0.96  CALCIUM 7.7*  --  7.4*  AST 26  --   --   ALT 21  --   --   ALKPHOS 62  --   --   BILITOT 0.8  --   --    ------------------------------------------------------------------------------------------------------------------ No results for input(s): CHOL, HDL, LDLCALC, TRIG, CHOLHDL, LDLDIRECT in the last 72 hours.  Lab Results  Component Value Date   HGBA1C 7.4* 04/23/2014   ------------------------------------------------------------------------------------------------------------------ No results for input(s): TSH, T4TOTAL, T3FREE, THYROIDAB in the last 72 hours.  Invalid input(s): FREET3 ------------------------------------------------------------------------------------------------------------------ No results for input(s): VITAMINB12, FOLATE, FERRITIN, TIBC, IRON, RETICCTPCT in the last 72 hours.  Coagulation profile No results for input(s): INR, PROTIME in the last 168 hours.  No results for input(s): DDIMER in the last 72 hours.  Cardiac Enzymes  Recent Labs Lab 08/21/15 1801  TROPONINI <0.03   ------------------------------------------------------------------------------------------------------------------    Component Value Date/Time   BNP 139.2* 04/23/2014 1308    Inpatient Medications  Scheduled Meds: . acidophilus  2 capsule Oral Daily  . aspirin EC  325 mg Oral Daily  .  atenolol  50 mg Oral BID  . aztreonam  2 g Intravenous Q8H  . calcium-vitamin D  2 tablet Oral TID  . furosemide  80 mg Oral BID  . heparin  5,000 Units Subcutaneous Q8H  . insulin aspart  0-15 Units Subcutaneous TID WC  . levothyroxine  300 mcg Oral QAC breakfast  . lisinopril  10 mg Oral Daily  . pantoprazole  40 mg Oral Daily  . potassium  chloride SA  40 mEq Oral Daily  . pravastatin  20 mg Oral q1800  . saccharomyces boulardii  250 mg Oral Daily   Continuous Infusions: . sodium chloride 100 mL/hr at 08/22/15 1255   PRN Meds:.acetaminophen **OR** acetaminophen, albuterol, ondansetron **OR** ondansetron (ZOFRAN) IV  Micro Results Recent Results (from the past 240 hour(s))  Urine culture     Status: None   Collection Time: 08/21/15  3:31 PM  Result Value Ref Range Status   Specimen Description URINE, RANDOM  Final   Special Requests NONE  Final   Culture NO GROWTH  Final   Report Status 08/22/2015 FINAL  Final  Blood Culture (routine x 2)     Status: None (Preliminary result)   Collection Time: 08/21/15  7:19 PM  Result Value Ref Range Status   Specimen Description BLOOD LEFT HAND  Final   Special Requests BOTTLES DRAWN AEROBIC AND ANAEROBIC 5CC EA  Final   Culture NO GROWTH < 24 HOURS  Final   Report Status PENDING  Incomplete  Blood Culture (routine x 2)     Status: None (Preliminary result)   Collection Time: 08/21/15  7:25 PM  Result Value Ref Range Status   Specimen Description BLOOD RIGHT ANTECUBITAL  Final   Special Requests BOTTLES DRAWN AEROBIC AND ANAEROBIC 5CC   Final   Culture NO GROWTH < 24 HOURS  Final   Report Status PENDING  Incomplete    Radiology Reports Dg Abd 2 Views  08/21/2015  CLINICAL DATA:  Abdominal pain, nausea, vomiting and diarrhea starting at 4 a.m. History of diabetes. History of thyroid cancer status post resection. EXAM: ABDOMEN - 2 VIEW COMPARISON:  None. FINDINGS: Bowel gas pattern is nonobstructive. No evidence of soft tissue mass or abnormal fluid collection. No evidence of free intraperitoneal air. Cholecystectomy clips in the right upper quadrant. Mild degenerative change in the lower lumbar spine. No acute- appearing osseous abnormality. Lung bases are grossly clear. IMPRESSION: Nonobstructive bowel gas pattern and no evidence of acute intra-abdominal abnormality.  Electronically Signed   By: Franki Cabot M.D.   On: 08/21/2015 21:34    Time Spent in minutes  25 minutes   Cristle Jared M.D on 08/22/2015 at 4:33 PM  Between 7am to 7pm - Pager - [phone]  After 7pm go to [email or web address] - password [facility]  [facility] -  Office  [phone]

## 2015-08-23 DIAGNOSIS — K529 Noninfective gastroenteritis and colitis, unspecified: Secondary | ICD-10-CM | POA: Diagnosis not present

## 2015-08-23 DIAGNOSIS — E119 Type 2 diabetes mellitus without complications: Secondary | ICD-10-CM | POA: Diagnosis not present

## 2015-08-23 DIAGNOSIS — I10 Essential (primary) hypertension: Secondary | ICD-10-CM | POA: Diagnosis not present

## 2015-08-23 LAB — GASTROINTESTINAL PANEL BY PCR, STOOL (REPLACES STOOL CULTURE)
ASTROVIRUS: NOT DETECTED
Adenovirus F40/41: NOT DETECTED
CAMPYLOBACTER SPECIES: NOT DETECTED
CRYPTOSPORIDIUM: NOT DETECTED
CYCLOSPORA CAYETANENSIS: NOT DETECTED
E. COLI O157: NOT DETECTED
ENTEROTOXIGENIC E COLI (ETEC): NOT DETECTED
Entamoeba histolytica: NOT DETECTED
Enteroaggregative E coli (EAEC): NOT DETECTED
Enteropathogenic E coli (EPEC): NOT DETECTED
Giardia lamblia: NOT DETECTED
Norovirus GI/GII: NOT DETECTED
PLESIMONAS SHIGELLOIDES: NOT DETECTED
ROTAVIRUS A: DETECTED — AB
SAPOVIRUS (I, II, IV, AND V): NOT DETECTED
SHIGA LIKE TOXIN PRODUCING E COLI (STEC): NOT DETECTED
Salmonella species: NOT DETECTED
Shigella/Enteroinvasive E coli (EIEC): NOT DETECTED
VIBRIO SPECIES: NOT DETECTED
Vibrio cholerae: NOT DETECTED
YERSINIA ENTEROCOLITICA: NOT DETECTED

## 2015-08-23 LAB — GLUCOSE, CAPILLARY
Glucose-Capillary: 157 mg/dL — ABNORMAL HIGH (ref 65–99)
Glucose-Capillary: 172 mg/dL — ABNORMAL HIGH (ref 65–99)

## 2015-08-23 MED ORDER — FUROSEMIDE 40 MG PO TABS: ORAL_TABLET | ORAL | Status: DC

## 2015-08-23 NOTE — Progress Notes (Signed)
Nsg Discharge Note  Admit Date:  08/21/2015 Discharge date: 08/23/2015   [name] to be D/C'd Home per MD order.  AVS completed.  Copy for chart, and copy for patient signed, and dated. Patient/caregiver able to verbalize understanding.  Discharge Medication:   Medication List    STOP taking these medications        ciprofloxacin 500 MG tablet  Commonly known as:  CIPRO     ondansetron 8 MG disintegrating tablet  Commonly known as:  ZOFRAN-ODT     promethazine 25 MG tablet  Commonly known as:  PHENERGAN     sucralfate 1 g tablet  Commonly known as:  CARAFATE     temazepam 15 MG capsule  Commonly known as:  RESTORIL      TAKE these medications        acetaminophen 650 MG CR tablet  Commonly known as:  TYLENOL  Take 650 mg by mouth every 8 (eight) hours as needed for pain.     albuterol 108 (90 Base) MCG/ACT inhaler  Commonly known as:  PROVENTIL HFA;VENTOLIN HFA  Inhale 2 puffs into the lungs every 4 (four) hours as needed for wheezing or shortness of breath.     aspirin EC 325 MG tablet  Take 325 mg by mouth daily.     atenolol 50 MG tablet  Commonly known as:  TENORMIN  Take 50 mg by mouth 2 (two) times daily.     calcium-vitamin D 500-200 MG-UNIT tablet  Commonly known as:  OSCAL WITH D  Take 1 tablet by mouth 3 (three) times daily.     Diclofenac Sodium 3 % Gel  Place 1 application onto the skin daily as needed (ankle pain).     furosemide 40 MG tablet  Commonly known as:  LASIX  Please continue to hold today and tomorrow, resume back on Monday     lansoprazole 15 MG capsule  Commonly known as:  PREVACID  Take 1 capsule (15 mg total) by mouth daily at 12 noon.     levothyroxine 300 MCG tablet  Commonly known as:  SYNTHROID, LEVOTHROID  Take 300 mcg by mouth daily before breakfast.     lisinopril 10 MG tablet  Commonly known as:  PRINIVIL,ZESTRIL  Take 10 mg by mouth daily.     metFORMIN 1000 MG tablet  Commonly known as:  GLUCOPHAGE  Take  1 tablet (1,000 mg total) by mouth 2 (two) times daily with a meal.     potassium chloride SA 20 MEQ tablet  Commonly known as:  K-DUR,KLOR-CON  Take 40 mEq by mouth 2 (two) times daily.     pravastatin 20 MG tablet  Commonly known as:  PRAVACHOL  Take 20 mg by mouth daily.     PROBIOTIC PO  Take 1 tablet by mouth daily.        Discharge Assessment: Filed Vitals:   08/22/15 2228 08/23/15 0533  BP: 115/43 130/66  Pulse: 62 67  Temp: 98.1 F (36.7 C) 98.5 F (36.9 C)  Resp: 19 16   Skin clean, dry and intact without evidence of skin break down, no evidence of skin tears noted. IV catheter discontinued intact. Site without signs and symptoms of complications - no redness or edema noted at insertion site, patient denies c/o pain - only slight tenderness at site.  Dressing with slight pressure applied.  D/c Instructions-Education: Discharge instructions given to patient/family with verbalized understanding. D/c education completed with patient/family including follow up instructions, medication list, d/c  activities limitations if indicated, with other d/c instructions as indicated by MD - patient able to verbalize understanding, all questions fully answered. Patient instructed to return to ED, call 911, or call MD for any changes in condition.  Patient escorted via [employer], and D/C home via private auto.  Dayle Points, RN 08/23/2015 1:43 PM

## 2015-08-23 NOTE — Discharge Summary (Signed)
[name], is a 55 y.o. female  DOB Nov 11, 1960  MRN [id number].  Admission date:  08/21/2015  Admitting Physician  Albertine Patricia, MD  Discharge Date:  08/23/2015   Primary MD  Kristine Garbe, MD  Recommendations for primary care physician for things to follow:  - Please check CBC, BMP during next visit.   Admission Diagnosis  Pain [R52] Body aches [R52] Non-intractable vomiting with nausea, vomiting of unspecified type [R11.2]   Discharge Diagnosis  Pain [R52] Body aches [R52] Non-intractable vomiting with nausea, vomiting of unspecified type [R11.2]    Active Problems:   Type 2 diabetes mellitus ([place])   Hypercholesteremia   Essential hypertension   Gastroenteritis   Hypothyroid      Past Medical History  Diagnosis Date  . Diabetes mellitus   . Hypertension   . Hypercholesteremia   . Heart murmur   . Complication of anesthesia   . PONV (postoperative nausea and vomiting)   . 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 Family history of adverse reaction to anesthesia     difficult for father to wake after surgery  . Dysrhythmia     " irregular heart rate per patient"  . Asthma     due to allergies   . Anemia     hx of   . Thyroid disease   . Cancer [facility])     Past Surgical History  Procedure Laterality Date  . Colon surgery    . Abdominal hysterectomy    . Tonsillectomy    . Knee arthroscopy    . Cholecystectomy    . Left heart catheterization with coronary angiogram N/A 04/24/2014    Procedure: LEFT HEART CATHETERIZATION WITH CORONARY ANGIOGRAM;  Surgeon: Burnell Blanks, MD;  Location: [facility] CATH LAB;  Service: Cardiovascular;  Laterality: N/A;  . Thyroidectomy N/A 09/20/2014    Procedure: TOTAL THYROIDECTOMY;  Surgeon: Armandina Gemma, MD;  Location: [facility] ORS;  Service: General;  Laterality: N/A;       History of present illness and  Hospital Course:     Kindly see H&P  for history of present illness and admission details, please review complete Labs, Consult reports and Test reports for all details in brief  HPI  from the history and physical done on the day of admission 08/21/2015 [name] is a 55 y.o. female, With past medical history of type 2 diabetes, thyroid cancer status post resection, hypertension, presents with complaints of abdominal pain, nausea, vomiting, diarrhea, started 4 AM, reports take 6-7 watery bowel movement, as well as vomited 6-7 times, denies any coffee-ground emesis, any bright red blood per rectum or melena, as well reports generalized weakness, having chills, patient reports she has been recently treated for UTI by PCP, she finished 1 week of antibiotic, cannot recall the name, was called again by PCP for cultures growing Escherichia coli where she will was prescribed another week of different antibiotic, they would've been her last day. - In ED workup significant for mildly  elevated lactic acid at 2.3, otherwise no significant labs abnormalities, negative urinalysis, patient with fever 100.9, hospitalist requested to admit.   Hospital Course  55 y.o. female, With past medical history of type 2 diabetes, thyroid cancer status post resection, hypertension, presents with complaints of abdominal pain, nausea, vomiting, diarrhea, febrile in ED 100.9, with elevated lactic acid, likely due to volume depletion and dehydration from viral gastroenteritis  Abdominal pain, nausea, vomiting, diarrhea - This is most likely related to viral gastroenteritis,  -  If negative, GI panel pending at time of discharge - No recurrence during hospital stay, currently no nausea, vomiting, diarrhea resolved - Lactic acid normalized - Agent was treated with IV as active during hospital stay for suspicion of UTI, and this came back negative, urine culture with no growth, no indication for further antibiotics at discharge.  Diabetes mellitus - U  metformin on discharge  Hypertension - Blood pressure acceptable, continue with home medication  Hypercholesterolemia - Continue with statin  Hypothyroidism/status post subtotal thyroidectomy - Continue with Synthroid   Discharge Condition:  Stable   Follow UP  Follow-up Information    Schedule an appointment as soon as possible for a visit with Kristine Garbe, MD.   Specialty:  Family Medicine   Contact information:   [address] [place] [address] [phone]         Discharge Instructions  and  Discharge Medications     Discharge Instructions    Discharge instructions    Complete by:  As directed   ollow with Primary MD [name],Denise D, MD in 7 days   Get CBC, CMP checked  by Primary MD next visit.    Activity: As tolerated with Full fall precautions use walker/cane & assistance as needed   Disposition Home    Diet: Heart Healthy , carbohydrate modified , with feeding assistance and aspiration precautions.  For Heart failure patients - Check your Weight same time everyday, if you gain over 2 pounds, or you develop in leg swelling, experience more shortness of breath or chest pain, call your Primary MD immediately. Follow Cardiac Low Salt Diet and 1.5 lit/day fluid restriction.   On your next visit with your primary care physician please Get Medicines reviewed and adjusted.   Please request your Prim.MD to go over all Hospital Tests and Procedure/Radiological results at the follow up, please get all Hospital records sent to your Prim MD by signing hospital release before you go home.   If you experience worsening of your admission symptoms, develop shortness of breath, life threatening emergency, suicidal or homicidal thoughts you must seek medical attention immediately by calling 911 or calling your MD immediately  if symptoms less severe.  You Must read complete instructions/literature along with all the possible adverse reactions/side  effects for all the Medicines you take and that have been prescribed to you. Take any new Medicines after you have completely understood and accpet all the possible adverse reactions/side effects.   Do not drive, operating heavy machinery, perform activities at heights, swimming or participation in water activities or provide baby sitting services if your were admitted for syncope or siezures until you have seen by Primary MD or a Neurologist and advised to do so again.  Do not drive when taking Pain medications.    Do not take more than prescribed Pain, Sleep and Anxiety Medications  Special Instructions: If you have smoked or chewed Tobacco  in the last 2 yrs please stop smoking, stop any  regular Alcohol  and or any Recreational drug use.  Wear Seat belts while driving.   Please note  You were cared for by a hospitalist during your hospital stay. If you have any questions about your discharge medications or the care you received while you were in the hospital after you are discharged, you can call the unit and asked to speak with the hospitalist on call if the hospitalist that took care of you is not available. Once you are discharged, your primary care physician will handle any further medical issues. Please note that NO REFILLS for any discharge medications will be authorized once you are discharged, as it is imperative that you return to your primary care physician (or establish a relationship with a primary care physician if you do not have one) for your aftercare needs so that they can reassess your need for medications and monitor your lab values.     Increase activity slowly    Complete by:  As directed             Medication List    STOP taking these medications        ciprofloxacin 500 MG tablet  Commonly known as:  CIPRO     ondansetron 8 MG disintegrating tablet  Commonly known as:  ZOFRAN-ODT     promethazine 25 MG tablet  Commonly known as:  PHENERGAN      sucralfate 1 g tablet  Commonly known as:  CARAFATE     temazepam 15 MG capsule  Commonly known as:  RESTORIL      TAKE these medications        acetaminophen 650 MG CR tablet  Commonly known as:  TYLENOL  Take 650 mg by mouth every 8 (eight) hours as needed for pain.     albuterol 108 (90 Base) MCG/ACT inhaler  Commonly known as:  PROVENTIL HFA;VENTOLIN HFA  Inhale 2 puffs into the lungs every 4 (four) hours as needed for wheezing or shortness of breath.     aspirin EC 325 MG tablet  Take 325 mg by mouth daily.     atenolol 50 MG tablet  Commonly known as:  TENORMIN  Take 50 mg by mouth 2 (two) times daily.     calcium-vitamin Mcconnell 500-200 MG-UNIT tablet  Commonly known as:  OSCAL WITH Mcconnell  Take 1 tablet by mouth 3 (three) times daily.     Diclofenac Sodium 3 % Gel  Place 1 application onto the skin daily as needed (ankle pain).     furosemide 40 MG tablet  Commonly known as:  LASIX  Please continue to hold today and tomorrow, resume back on Monday     lansoprazole 15 MG capsule  Commonly known as:  PREVACID  Take 1 capsule (15 mg total) by mouth daily at 12 noon.     levothyroxine 300 MCG tablet  Commonly known as:  SYNTHROID, LEVOTHROID  Take 300 mcg by mouth daily before breakfast.     lisinopril 10 MG tablet  Commonly known as:  PRINIVIL,ZESTRIL  Take 10 mg by mouth daily.     metFORMIN 1000 MG tablet  Commonly known as:  GLUCOPHAGE  Take 1 tablet (1,000 mg total) by mouth 2 (two) times daily with a meal.     potassium chloride SA 20 MEQ tablet  Commonly known as:  K-DUR,KLOR-CON  Take 40 mEq by mouth 2 (two) times daily.     pravastatin 20 MG tablet  Commonly known as:  PRAVACHOL  Take 20 mg by mouth daily.     PROBIOTIC PO  Take 1 tablet by mouth daily.          Diet and Activity recommendation: See Discharge Instructions above   Consults obtained -  none   Major procedures and Radiology Reports - PLEASE review detailed and final reports for  all details, in brief -     Dg Abd 2 Views  08/21/2015  CLINICAL DATA:  Abdominal pain, nausea, vomiting and diarrhea starting at 4 a.m. History of diabetes. History of thyroid cancer status post resection. EXAM: ABDOMEN - 2 VIEW COMPARISON:  None. FINDINGS: Bowel gas pattern is nonobstructive. No evidence of soft tissue mass or abnormal fluid collection. No evidence of free intraperitoneal air. Cholecystectomy clips in the right upper quadrant. Mild degenerative change in the lower lumbar spine. No acute- appearing osseous abnormality. Lung bases are grossly clear. IMPRESSION: Nonobstructive bowel gas pattern and no evidence of acute intra-abdominal abnormality. Electronically Signed   By: Franki Cabot M.Mcconnell.   On: 08/21/2015 21:34    Micro Results     Recent Results (from the past 240 hour(s))  Urine culture     Status: None   Collection Time: 08/21/15  3:31 PM  Result Value Ref Range Status   Specimen Description URINE, RANDOM  Final   Special Requests NONE  Final   Culture NO GROWTH  Final   Report Status 08/22/2015 FINAL  Final  Blood Culture (routine x 2)     Status: None (Preliminary result)   Collection Time: 08/21/15  7:19 PM  Result Value Ref Range Status   Specimen Description BLOOD LEFT HAND  Final   Special Requests BOTTLES DRAWN AEROBIC AND ANAEROBIC 5CC EA  Final   Culture NO GROWTH 2 DAYS  Final   Report Status PENDING  Incomplete  Blood Culture (routine x 2)     Status: None (Preliminary result)   Collection Time: 08/21/15  7:25 PM  Result Value Ref Range Status   Specimen Description BLOOD RIGHT ANTECUBITAL  Final   Special Requests BOTTLES DRAWN AEROBIC AND ANAEROBIC 5CC   Final   Culture NO GROWTH 2 DAYS  Final   Report Status PENDING  Incomplete  C difficile quick scan w PCR reflex     Status: None   Collection Time: 08/22/15  5:36 PM  Result Value Ref Range Status   C Diff antigen NEGATIVE NEGATIVE Final   C Diff toxin NEGATIVE NEGATIVE Final   C Diff  interpretation Negative for toxigenic C. difficile  Final       Today   Subjective:   [name] today has no headache,no chest abdominal pain,no new weakness tingling or numbness, feels much better wants to go home today.   Objective:   Blood pressure 130/66, pulse 67, temperature 98.5 F (36.9 C), temperature source Oral, resp. rate 16, height 5\' 6"  (1.676 m), weight 114.76 kg (253 lb), SpO2 98 %.   Intake/Output Summary (Last 24 hours) at 08/23/15 1301 Last data filed at 08/23/15 0900  Gross per 24 hour  Intake 2615.42 ml  Output      0 ml  Net 2615.42 ml    Exam Awake Alert, Oriented x 3, No new F.N deficits, Normal affect [place].AT,PERRAL Supple Neck,No JVD, No cervical lymphadenopathy appriciated.  Symmetrical Chest wall movement, Good air movement bilaterally, CTAB RRR,No Gallops,Rubs or new Murmurs, No Parasternal Heave +ve B.Sounds, Abd Soft, Non tender, No organomegaly appriciated, No rebound -guarding or rigidity. No Cyanosis,  Clubbing or edema, No new Rash or bruise  Data Review   CBC w Diff: Lab Results  Component Value Date   WBC 4.4 08/22/2015   HGB 10.7* 08/22/2015   HCT 34.0* 08/22/2015   PLT 175 08/22/2015   LYMPHOPCT 29 10/07/2014   MONOPCT 4 10/07/2014   EOSPCT 9* 10/07/2014   BASOPCT 0 10/07/2014    CMP: Lab Results  Component Value Date   NA 141 08/22/2015   K 3.4* 08/22/2015   CL 107 08/22/2015   CO2 27 08/22/2015   BUN 7 08/22/2015   CREATININE 0.96 08/22/2015   PROT 7.1 08/21/2015   ALBUMIN 3.6 08/21/2015   BILITOT 0.8 08/21/2015   ALKPHOS 62 08/21/2015   AST 26 08/21/2015   ALT 21 08/21/2015  .   Total Time in preparing paper work, data evaluation and todays exam - 35 minutes  Amye Grego M.Mcconnell on 08/23/2015 at 1:01 PM  [facility]   Office  [phone]

## 2015-08-23 NOTE — Discharge Instructions (Signed)
Follow with Primary MD REESE,BETTI D, MD in 7 days   Get CBC, CMP checked  by Primary MD next visit.    Activity: As tolerated with Full fall precautions use walker/cane & assistance as needed   Disposition Home    Diet: Heart Healthy , carbohydrate modified , with feeding assistance and aspiration precautions.  For Heart failure patients - Check your Weight same time everyday, if you gain over 2 pounds, or you develop in leg swelling, experience more shortness of breath or chest pain, call your Primary MD immediately. Follow Cardiac Low Salt Diet and 1.5 lit/day fluid restriction.   On your next visit with your primary care physician please Get Medicines reviewed and adjusted.   Please request your Prim.MD to go over all Hospital Tests and Procedure/Radiological results at the follow up, please get all Hospital records sent to your Prim MD by signing hospital release before you go home.   If you experience worsening of your admission symptoms, develop shortness of breath, life threatening emergency, suicidal or homicidal thoughts you must seek medical attention immediately by calling 911 or calling your MD immediately  if symptoms less severe.  You Must read complete instructions/literature along with all the possible adverse reactions/side effects for all the Medicines you take and that have been prescribed to you. Take any new Medicines after you have completely understood and accpet all the possible adverse reactions/side effects.   Do not drive, operating heavy machinery, perform activities at heights, swimming or participation in water activities or provide baby sitting services if your were admitted for syncope or siezures until you have seen by Primary MD or a Neurologist and advised to do so again.  Do not drive when taking Pain medications.    Do not take more than prescribed Pain, Sleep and Anxiety Medications  Special Instructions: If you have smoked or chewed Tobacco  in  the last 2 yrs please stop smoking, stop any regular Alcohol  and or any Recreational drug use.  Wear Seat belts while driving.   Please note  You were cared for by a hospitalist during your hospital stay. If you have any questions about your discharge medications or the care you received while you were in the hospital after you are discharged, you can call the unit and asked to speak with the hospitalist on call if the hospitalist that took care of you is not available. Once you are discharged, your primary care physician will handle any further medical issues. Please note that NO REFILLS for any discharge medications will be authorized once you are discharged, as it is imperative that you return to your primary care physician (or establish a relationship with a primary care physician if you do not have one) for your aftercare needs so that they can reassess your need for medications and monitor your lab values.

## 2015-08-26 LAB — CULTURE, BLOOD (ROUTINE X 2)
CULTURE: NO GROWTH
Culture: NO GROWTH

## 2015-09-13 ENCOUNTER — Encounter (HOSPITAL_COMMUNITY): Payer: Self-pay | Admitting: *Deleted

## 2015-09-13 ENCOUNTER — Other Ambulatory Visit: Payer: Self-pay

## 2015-09-13 ENCOUNTER — Observation Stay (HOSPITAL_COMMUNITY)
Admission: EM | Admit: 2015-09-13 | Discharge: 2015-09-15 | Disposition: A | Discharge status: Home / Self Care | Payer: BC Managed Care – PPO | Attending: Internal Medicine | Admitting: Internal Medicine

## 2015-09-13 ENCOUNTER — Emergency Department (HOSPITAL_COMMUNITY): Payer: BC Managed Care – PPO

## 2015-09-13 DIAGNOSIS — R4781 Slurred speech: Secondary | ICD-10-CM | POA: Diagnosis not present

## 2015-09-13 DIAGNOSIS — I10 Essential (primary) hypertension: Secondary | ICD-10-CM | POA: Diagnosis not present

## 2015-09-13 DIAGNOSIS — R29810 Facial weakness: Secondary | ICD-10-CM | POA: Diagnosis not present

## 2015-09-13 DIAGNOSIS — J45909 Unspecified asthma, uncomplicated: Secondary | ICD-10-CM | POA: Insufficient documentation

## 2015-09-13 DIAGNOSIS — Z85850 Personal history of malignant neoplasm of thyroid: Secondary | ICD-10-CM | POA: Diagnosis not present

## 2015-09-13 DIAGNOSIS — E7800 Pure hypercholesterolemia, unspecified: Secondary | ICD-10-CM | POA: Insufficient documentation

## 2015-09-13 DIAGNOSIS — K1120 Sialoadenitis, unspecified: Principal | ICD-10-CM | POA: Diagnosis present

## 2015-09-13 DIAGNOSIS — E890 Postprocedural hypothyroidism: Secondary | ICD-10-CM | POA: Diagnosis not present

## 2015-09-13 DIAGNOSIS — E119 Type 2 diabetes mellitus without complications: Secondary | ICD-10-CM | POA: Insufficient documentation

## 2015-09-13 DIAGNOSIS — G459 Transient cerebral ischemic attack, unspecified: Secondary | ICD-10-CM

## 2015-09-13 DIAGNOSIS — Z6841 Body Mass Index (BMI) 40.0 and over, adult: Secondary | ICD-10-CM | POA: Insufficient documentation

## 2015-09-13 DIAGNOSIS — Z7984 Long term (current) use of oral hypoglycemic drugs: Secondary | ICD-10-CM | POA: Insufficient documentation

## 2015-09-13 DIAGNOSIS — I209 Angina pectoris, unspecified: Secondary | ICD-10-CM

## 2015-09-13 DIAGNOSIS — Z79899 Other long term (current) drug therapy: Secondary | ICD-10-CM | POA: Diagnosis not present

## 2015-09-13 DIAGNOSIS — R6884 Jaw pain: Secondary | ICD-10-CM | POA: Diagnosis present

## 2015-09-13 DIAGNOSIS — E669 Obesity, unspecified: Secondary | ICD-10-CM | POA: Diagnosis not present

## 2015-09-13 DIAGNOSIS — Z7982 Long term (current) use of aspirin: Secondary | ICD-10-CM | POA: Insufficient documentation

## 2015-09-13 DIAGNOSIS — R079 Chest pain, unspecified: Secondary | ICD-10-CM | POA: Diagnosis present

## 2015-09-13 DIAGNOSIS — I2510 Atherosclerotic heart disease of native coronary artery without angina pectoris: Secondary | ICD-10-CM | POA: Insufficient documentation

## 2015-09-13 DIAGNOSIS — I712 Thoracic aortic aneurysm, without rupture, unspecified: Secondary | ICD-10-CM | POA: Diagnosis present

## 2015-09-13 LAB — CBC
HEMATOCRIT: 39.1 % (ref 36.0–46.0)
Hemoglobin: 12.4 g/dL (ref 12.0–15.0)
MCH: 27.7 pg (ref 26.0–34.0)
MCHC: 31.7 g/dL (ref 30.0–36.0)
MCV: 87.3 fL (ref 78.0–100.0)
Platelets: 283 10*3/uL (ref 150–400)
RBC: 4.48 MIL/uL (ref 3.87–5.11)
RDW: 14 % (ref 11.5–15.5)
WBC: 6.4 10*3/uL (ref 4.0–10.5)

## 2015-09-13 LAB — BASIC METABOLIC PANEL
Anion gap: 9 (ref 5–15)
BUN: 9 mg/dL (ref 6–20)
CHLORIDE: 106 mmol/L (ref 101–111)
CO2: 25 mmol/L (ref 22–32)
Calcium: 9 mg/dL (ref 8.9–10.3)
Creatinine, Ser: 0.93 mg/dL (ref 0.44–1.00)
GFR calc Af Amer: >60 mL/min (ref >60)
GFR calc non Af Amer: >60 mL/min (ref >60)
GLUCOSE: 203 mg/dL — AB (ref 65–99)
POTASSIUM: 3.7 mmol/L (ref 3.5–5.1)
SODIUM: 140 mmol/L (ref 135–145)

## 2015-09-13 LAB — I-STAT TROPONIN, ED: Troponin i, poc: 0 ng/mL (ref 0.00–0.08)

## 2015-09-13 MED ORDER — SODIUM CHLORIDE 0.9 % IV BOLUS (SEPSIS)
1000.0000 mL | Freq: Once | INTRAVENOUS | Status: AC
  Administered 2015-09-13: 1000 mL via INTRAVENOUS

## 2015-09-13 MED ORDER — LISINOPRIL 10 MG PO TABS
10.0000 mg | ORAL_TABLET | Freq: Every day | ORAL | Status: DC
  Administered 2015-09-14 – 2015-09-15 (×2): 10 mg via ORAL
  Filled 2015-09-13 (×3): qty 1

## 2015-09-13 MED ORDER — IOPAMIDOL (ISOVUE-300) INJECTION 61%
INTRAVENOUS | Status: AC
  Administered 2015-09-13: 75 mL
  Filled 2015-09-13: qty 75

## 2015-09-13 MED ORDER — ACETAMINOPHEN 325 MG PO TABS
650.0000 mg | ORAL_TABLET | Freq: Three times a day (TID) | ORAL | Status: DC | PRN
  Administered 2015-09-14 (×2): 650 mg via ORAL
  Filled 2015-09-13 (×2): qty 2

## 2015-09-13 MED ORDER — METFORMIN HCL 500 MG PO TABS
1000.0000 mg | ORAL_TABLET | Freq: Two times a day (BID) | ORAL | Status: DC
Start: 2015-09-14 — End: 2015-09-15
  Administered 2015-09-14 – 2015-09-15 (×3): 1000 mg via ORAL
  Filled 2015-09-13 (×3): qty 2

## 2015-09-13 MED ORDER — CLINDAMYCIN HCL 150 MG PO CAPS
300.0000 mg | ORAL_CAPSULE | Freq: Three times a day (TID) | ORAL | Status: DC
  Administered 2015-09-14 (×2): 300 mg via ORAL
  Filled 2015-09-13 (×4): qty 2

## 2015-09-13 MED ORDER — PANTOPRAZOLE SODIUM 20 MG PO TBEC
20.0000 mg | DELAYED_RELEASE_TABLET | Freq: Every day | ORAL | Status: DC
  Administered 2015-09-14 – 2015-09-15 (×2): 20 mg via ORAL
  Filled 2015-09-13 (×2): qty 1

## 2015-09-13 MED ORDER — ALBUTEROL SULFATE (2.5 MG/3ML) 0.083% IN NEBU: 3.0000 mL | INHALATION_SOLUTION | RESPIRATORY_TRACT | Status: DC | PRN

## 2015-09-13 MED ORDER — POTASSIUM CHLORIDE CRYS ER 20 MEQ PO TBCR
40.0000 meq | EXTENDED_RELEASE_TABLET | Freq: Two times a day (BID) | ORAL | Status: DC
Start: 2015-09-13 — End: 2015-09-15
  Administered 2015-09-14 – 2015-09-15 (×4): 40 meq via ORAL
  Filled 2015-09-13 (×5): qty 2

## 2015-09-13 MED ORDER — ATENOLOL 25 MG PO TABS
50.0000 mg | ORAL_TABLET | Freq: Two times a day (BID) | ORAL | Status: DC
  Administered 2015-09-14 – 2015-09-15 (×4): 50 mg via ORAL
  Filled 2015-09-13 (×5): qty 2

## 2015-09-13 MED ORDER — PRAVASTATIN SODIUM 40 MG PO TABS
20.0000 mg | ORAL_TABLET | Freq: Every day | ORAL | Status: DC
  Administered 2015-09-14 – 2015-09-15 (×2): 20 mg via ORAL
  Filled 2015-09-13 (×3): qty 1

## 2015-09-13 MED ORDER — ENOXAPARIN SODIUM 40 MG/0.4ML ~~LOC~~ SOLN
40.0000 mg | SUBCUTANEOUS | Status: DC
  Filled 2015-09-13 (×3): qty 0.4

## 2015-09-13 MED ORDER — FUROSEMIDE 20 MG PO TABS
80.0000 mg | ORAL_TABLET | Freq: Two times a day (BID) | ORAL | Status: DC
  Administered 2015-09-14: 80 mg via ORAL
  Filled 2015-09-13: qty 4

## 2015-09-13 MED ORDER — ASPIRIN EC 325 MG PO TBEC: 325.0000 mg | DELAYED_RELEASE_TABLET | Freq: Every day | ORAL | Status: DC

## 2015-09-13 MED ORDER — LEVOTHYROXINE SODIUM 150 MCG PO TABS
300.0000 ug | ORAL_TABLET | Freq: Every day | ORAL | Status: DC
  Administered 2015-09-14 – 2015-09-15 (×2): 300 ug via ORAL
  Filled 2015-09-13: qty 2
  Filled 2015-09-13: qty 6
  Filled 2015-09-13: qty 4
  Filled 2015-09-13: qty 2
  Filled 2015-09-13: qty 6

## 2015-09-13 MED ORDER — CLINDAMYCIN PHOSPHATE 600 MG/50ML IV SOLN
600.0000 mg | Freq: Once | INTRAVENOUS | Status: AC
  Administered 2015-09-13: 600 mg via INTRAVENOUS
  Filled 2015-09-13: qty 50

## 2015-09-13 MED ORDER — STROKE: EARLY STAGES OF RECOVERY BOOK
Freq: Once | Status: AC
  Administered 2015-09-14: 01:00:00
  Filled 2015-09-13: qty 1

## 2015-09-13 MED ORDER — ONDANSETRON HCL 4 MG/2ML IJ SOLN
4.0000 mg | Freq: Once | INTRAMUSCULAR | Status: AC
Start: 2015-09-13 — End: 2015-09-13
  Administered 2015-09-13: 4 mg via INTRAVENOUS
  Filled 2015-09-13: qty 2

## 2015-09-13 NOTE — ED Notes (Addendum)
Pt c/o L sided neck pain and shortness of breath on exertion. Hx of "aortic repair," has not been able to follow up with cardiologist in over a year for follow up

## 2015-09-13 NOTE — H&P (Signed)
History and Physical    [name] [id number] DOB: February 24, 1961 DOA: 09/13/2015   PCP: Kristine Garbe, MD Chief Complaint:  Chief Complaint  Patient presents with  . Jaw Pain    HPI: [name] is a 55 y.o. 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  Patient presents to the ED after an episode of L sided chest pain, neck pain, and jaw pain.  There was apparently also an episode of slurred speech and R sided facial droop occuring PTA.  By the time she hit triage her facial droop had resolved and has remained resolved.  Slurred speech was noted around 12:30 PM.  One episode of vomiting earlier today.  ED Course: By the time she is being evaluated by the EDP, her CP is completely resolved as is any facial droop or neuro symptoms.  She still has some L sided facial tenderness which was determined by CT scan to perhaps be due to some mild parotitis.  CT head and neck is otherwise unremarkable.  Trop negative.  Review of Systems: As per HPI otherwise 10 point review of systems negative.    Past Medical History  Diagnosis Date  . Diabetes mellitus   . Hypertension   . Hypercholesteremia   . Heart murmur   . Complication of anesthesia   . PONV (postoperative nausea and vomiting)   . 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 Family history of adverse reaction to anesthesia     difficult for father to wake after surgery  . Dysrhythmia     " irregular heart rate per patient"  . Asthma     due to allergies   . Anemia     hx of   . Thyroid disease   . Cancer [facility])     Past Surgical History  Procedure Laterality Date  . Colon surgery    . Abdominal hysterectomy    . Tonsillectomy    . Knee arthroscopy    . Cholecystectomy    . Left heart catheterization with coronary angiogram N/A 04/24/2014    Procedure: LEFT HEART CATHETERIZATION WITH CORONARY ANGIOGRAM;  Surgeon: Burnell Blanks, MD;   Location: [facility] CATH LAB;  Service: Cardiovascular;  Laterality: N/A;  . Thyroidectomy N/A 09/20/2014    Procedure: TOTAL THYROIDECTOMY;  Surgeon: Armandina Gemma, MD;  Location: [facility] ORS;  Service: General;  Laterality: N/A;     reports that she has never smoked. She has never used smokeless tobacco. She reports that she does not drink alcohol or use illicit drugs.  Allergies  Allergen Reactions  . Lorabid [Loracarbef] Anaphylaxis  . Codeine Nausea And Vomiting  . Sulfa Drugs Cross Reactors Other (See Comments)    Severe allergic reaction as a child - was not told symptoms  . Benicar [Olmesartan Medoxomil] Swelling and Rash  . Other Rash    Green peas    Family History  Problem Relation Age of Onset  . Heart failure Mother   . Stroke Father   . Heart failure Father   . Diabetes Mother   . Cancer Father       Prior to Admission medications   Medication Sig Start Date End Date Taking? Authorizing Provider  acetaminophen (TYLENOL) 650 MG CR tablet Take 650 mg by mouth every 8 (eight) hours as needed for pain.    Yes Historical Provider, MD  albuterol (PROVENTIL HFA;VENTOLIN HFA) 108 (90 BASE) MCG/ACT  inhaler Inhale 2 puffs into the lungs every 4 (four) hours as needed for wheezing or shortness of breath. 09/07/13  Yes Leeanne Rio, MD  aspirin EC 325 MG tablet Take 325 mg by mouth daily.   Yes Historical Provider, MD  atenolol (TENORMIN) 50 MG tablet Take 50 mg by mouth 2 (two) times daily.   Yes Historical Provider, MD  calcium-vitamin D (OSCAL WITH D) 500-200 MG-UNIT per tablet Take 1 tablet by mouth 3 (three) times daily. Patient taking differently: Take 2 tablets by mouth 3 (three) times daily.  09/20/14  Yes Armandina Gemma, MD  Diclofenac Sodium 3 % GEL Place 1 application onto the skin daily as needed (ankle pain).   Yes Historical Provider, MD  furosemide (LASIX) 40 MG tablet Please continue to hold today and tomorrow, resume back on Monday Patient taking differently: Take 80 mg by  mouth 2 (two) times daily.  08/23/15  Yes Albertine Patricia, MD  lansoprazole (PREVACID) 15 MG capsule Take 1 capsule (15 mg total) by mouth daily at 12 noon. 03/07/15  Yes Sharlett Iles, MD  levothyroxine (SYNTHROID, LEVOTHROID) 300 MCG tablet Take 300 mcg by mouth daily before breakfast.   Yes Historical Provider, MD  lisinopril (PRINIVIL,ZESTRIL) 10 MG tablet Take 10 mg by mouth daily.  09/09/14  Yes Historical Provider, MD  metFORMIN (GLUCOPHAGE) 1000 MG tablet Take 1 tablet (1,000 mg total) by mouth 2 (two) times daily with a meal. 04/29/14  Yes Maryann Mikhail, DO  potassium chloride SA (K-DUR,KLOR-CON) 20 MEQ tablet Take 40 mEq by mouth 2 (two) times daily.    Yes Historical Provider, MD  pravastatin (PRAVACHOL) 20 MG tablet Take 20 mg by mouth daily.  06/25/13  Yes Historical Provider, MD  Probiotic Product (PROBIOTIC PO) Take 1 tablet by mouth daily.   Yes Historical Provider, MD    Physical Exam: Filed Vitals:   09/13/15 1718 09/13/15 2030 09/13/15 2200 09/13/15 2230  BP: 145/85 127/75 144/75 141/67  Pulse: 72 68 71 69  Temp: 98.3 F (36.8 C)     TempSrc: Oral     Resp: 18 22 16 24   Height: 5\' 6"  (1.676 m)     Weight: 114.477 kg (252 lb 6 oz)     SpO2: 99% 100% 99% 99%      Constitutional: NAD, calm, comfortable Eyes: PERRL, lids and conjunctivae normal ENMT: Mucous membranes are moist. Posterior pharynx clear of any exudate or lesions.Normal dentition.  Neck: normal, supple, no masses, no thyromegaly Respiratory: clear to auscultation bilaterally, no wheezing, no crackles. Normal respiratory effort. No accessory muscle use.  Cardiovascular: Regular rate and rhythm, no murmurs / rubs / gallops. No extremity edema. 2+ pedal pulses. No carotid bruits.  Abdomen: no tenderness, no masses palpated. No hepatosplenomegaly. Bowel sounds positive.  Musculoskeletal: no clubbing / cyanosis. No joint deformity upper and lower extremities. Good ROM, no contractures. Normal muscle  tone.  Skin: no rashes, lesions, ulcers. No induration Neurologic: CN 2-12 grossly intact. Sensation intact, DTR normal. Strength 5/5 in all 4.  Psychiatric: Normal judgment and insight. Alert and oriented x 3. Normal mood.    Labs on Admission: I have personally reviewed following labs and imaging studies  CBC:  Recent Labs Lab 09/13/15 1734  WBC 6.4  HGB 12.4  HCT 39.1  MCV 87.3  PLT [id number]   Basic Metabolic Panel:  Recent Labs Lab 09/13/15 1734  NA 140  K 3.7  CL 106  CO2 25  GLUCOSE 203*  BUN  9  CREATININE 0.93  CALCIUM 9.0   GFR: Estimated Creatinine Clearance: 89.9 mL/min (by C-G formula based on Cr of 0.93). Liver Function Tests: No results for input(s): AST, ALT, ALKPHOS, BILITOT, PROT, ALBUMIN in the last 168 hours. No results for input(s): LIPASE, AMYLASE in the last 168 hours. No results for input(s): AMMONIA in the last 168 hours. Coagulation Profile: No results for input(s): INR, PROTIME in the last 168 hours. Cardiac Enzymes: No results for input(s): CKTOTAL, CKMB, CKMBINDEX, TROPONINI in the last 168 hours. BNP (last 3 results) No results for input(s): PROBNP in the last 8760 hours. HbA1C: No results for input(s): HGBA1C in the last 72 hours. CBG: No results for input(s): GLUCAP in the last 168 hours. Lipid Profile: No results for input(s): CHOL, HDL, LDLCALC, TRIG, CHOLHDL, LDLDIRECT in the last 72 hours. Thyroid Function Tests: No results for input(s): TSH, T4TOTAL, FREET4, T3FREE, THYROIDAB in the last 72 hours. Anemia Panel: No results for input(s): VITAMINB12, FOLATE, FERRITIN, TIBC, IRON, RETICCTPCT in the last 72 hours. Urine analysis:    Component Value Date/Time   COLORURINE YELLOW 08/21/2015 [place] 08/21/2015 1531   LABSPEC 1.016 08/21/2015 1531   PHURINE 6.5 08/21/2015 1531   GLUCOSEU NEGATIVE 08/21/2015 1531   HGBUR NEGATIVE 08/21/2015 1531   BILIRUBINUR NEGATIVE 08/21/2015 1531   KETONESUR NEGATIVE 08/21/2015  1531   PROTEINUR 100* 08/21/2015 1531   UROBILINOGEN 0.2 10/07/2014 1009   NITRITE NEGATIVE 08/21/2015 1531   LEUKOCYTESUR NEGATIVE 08/21/2015 1531   Sepsis Labs: @LABRCNTIP (procalcitonin:4,lacticidven:4) )No results found for this or any previous visit (from the past 240 hour(s)).   Radiological Exams on Admission: Dg Chest 2 View  09/13/2015  CLINICAL DATA:  Chest pain and tightness. EXAM: CHEST  2 VIEW COMPARISON:  09/18/2014 FINDINGS: The heart size and mediastinal contours are within normal limits. Both lungs are clear. The visualized skeletal structures are unremarkable. IMPRESSION: No active cardiopulmonary disease. Electronically Signed   By: Fidela Salisbury M.D.   On: 09/13/2015 17:44   Ct Head Wo Contrast  09/13/2015  CLINICAL DATA:  Initial evaluation for acute slurred speech. EXAM: CT HEAD WITHOUT CONTRAST TECHNIQUE: Contiguous axial images were obtained from the base of the skull through the vertex without intravenous contrast. COMPARISON:  Prior CT from 04/30/2007. FINDINGS: Mild age-related cerebral atrophy with chronic small vessel ischemic disease. Scattered vascular calcifications within the carotid siphons. No acute large vessel territory infarct. No acute intracranial hemorrhage. No mass lesion, midline shift, or mass effect. No hydrocephalus. No extra-axial fluid collection. Scalp soft tissues demonstrate no acute abnormality. No acute abnormality about the globes and orbits. Paranasal sinuses are clear.  No mastoid effusion. Calvarium intact. IMPRESSION: 1. No acute intracranial process. 2. Mild age-related cerebral atrophy with chronic small vessel ischemic disease. Electronically Signed   By: Jeannine Boga M.D.   On: 09/13/2015 21:26   Ct Soft Tissue Neck W Contrast  09/13/2015  CLINICAL DATA:  LEFT neck swelling. LEFT neck pain. BILATERAL jaw pain onset today, intermittently worse on the LEFT. EXAM: CT NECK WITH CONTRAST TECHNIQUE: Multidetector CT imaging of the  neck was performed using the standard protocol following the bolus administration of intravenous contrast. CONTRAST:  61mL ISOVUE-300 IOPAMIDOL (ISOVUE-300) INJECTION 61% COMPARISON:  None. FINDINGS: Pharynx and larynx: Airway midline. No tonsillar inflammation. Nasopharyngeal adenoidal hypertrophy without mastoid effusion. Parapharyngeal fat preserved. Salivary glands: The LEFT parotid appears slightly larger than the RIGHT, and may enhance slightly in its tail segment. Correlate clinically for unilateral parotitis. No  calculi. Normal submandibular glands. Thyroid: Surgically absent. Lymph nodes: No concerning lymphadenopathy to suggest recurrent thyroid cancer. Vascular: Mild atheromatous change. Limited intracranial: Negative. Visualized orbits: Negative Mastoids and visualized paranasal sinuses: Chronic opacity in the inferomedial LEFT maxillary sinus. Chronic periapical lucencies surround the LEFT maxillary molars. TMJs are located. LEFT paramedian maxillary medial incisor periapical lucency of uncertain acuity, resulting in cortical erosion. See image 23 series 7. Skeleton: Cervical spondylosis. No evidence for periodontal disease in the remaining mandibular teeth. No intrinsic mandibular abnormality. Upper chest: No masses. IMPRESSION: LEFT parotid appears slightly larger than the RIGHT, correlate clinically for mild inflammatory change in the LEFT parotid tail. LEFT paramedian medial incisor periapical lucency. Uncertain significance with regard and jaw pain. No mandibular periodontal disease or other significant abnormality. Status post thyroidectomy.  No concerning lymphadenopathy. Electronically Signed   By: Staci Righter M.D.   On: 09/13/2015 21:38    EKG: Independently reviewed.  Assessment/Plan Principal Problem:   Slurred speech Active Problems:   Chest pain   Essential hypertension   Thoracic aortic aneurysm (HCC)   Parotitis   Slurred Speech -  TIA pathway and work up  ordered  Chest pain -   Serial trops ordered  ACS seems less likely as patient just had heart cath in Jan 2016 which showed minimal non-occlusive disease.  Tele monitor  Aortic dissection in light of her 4.2cm ascending aneurysm is considered; however, felt to be very unlikely with negative CXR, spontaneous resolution of chest pain and CNS symptoms, etc.  At this point dont feel that the low likely hood of finding new Aortic dissection on CTA chest is worth the risk of additional contrast load after she just had contrast with CT neck already today.  CT neck did get down all the way to the aortic arch as well with no obvious or noted abnormality.  If she becomes symptomatic again, then would re-consider this.  HTN - continue home meds  Parotitis - will put on clindamycin   DVT prophylaxis: Lovenox Code Status: Full Family Communication: Family at bedside Consults called: None Admission status: Admit to obs   Etta Quill DO [facility] Pager [phone] from 7PM-7AM  If 7AM-7PM, please contact the day physician for the patient [email or web address] Password [facility] [place]  09/13/2015, 11:05 PM

## 2015-09-13 NOTE — ED Provider Notes (Signed)
CSN: [id number]     Arrival date & time 09/13/15  1706 History   First MD Initiated Contact with Patient 09/13/15 1919     Chief Complaint  Patient presents with  . Jaw Pain     (Consider location/radiation/quality/duration/timing/severity/associated sxs/prior Treatment) The history is provided by the patient.  [name] is a 55 y.o. female history of diabetes, hypertension, high cholesterol, obesity, here presenting with shortness of breath on exertion, jaw pain, slurred speech. She had some chest pain with some shortness of breath on exertion today. The pain does radiate to her jaw. She also noticed some swelling of her left jaw. Around 12:30 PM, she was talking on the phone with her son and he noticed some slurred speech. The daughter also noticed some right facial droop that improved. Triage nurse documented no slurred speech but she told me she had slurred speech that improved. She also vomited once today. She was admitted about 2 weeks ago for fever and was diagnosed with gastroenteritis.   Past Medical History  Diagnosis Date  . Diabetes mellitus   . Hypertension   . Hypercholesteremia   . Heart murmur   . Complication of anesthesia   . PONV (postoperative nausea and vomiting)   . 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 Family history of adverse reaction to anesthesia     difficult for father to wake after surgery  . Dysrhythmia     " irregular heart rate per patient"  . Asthma     due to allergies   . Anemia     hx of   . Thyroid disease   . Cancer [facility])    Past Surgical History  Procedure Laterality Date  . Colon surgery    . Abdominal hysterectomy    . Tonsillectomy    . Knee arthroscopy    . Cholecystectomy    . Left heart catheterization with coronary angiogram N/A 04/24/2014    Procedure: LEFT HEART CATHETERIZATION WITH CORONARY ANGIOGRAM;  Surgeon: Burnell Blanks, MD;  Location[facility] CATH LAB;  Service: Cardiovascular;  Laterality: N/A;  . Thyroidectomy N/A  09/20/2014    Procedure: TOTAL THYROIDECTOMY;  Surgeon: Armandina Gemma, MD;  Location: [facility] ORS;  Service: General;  Laterality: N/A;   Family History  Problem Relation Age of Onset  . Heart failure Mother   . Stroke Father   . Heart failure Father   . Diabetes Mother   . Cancer Father    Social History  Substance Use Topics  . Smoking status: Never Smoker   . Smokeless tobacco: Never Used  . Alcohol Use: No   OB History    No data available     Review of Systems  HENT:       Jaw pain   Respiratory: Positive for shortness of breath.   Cardiovascular: Positive for chest pain.  Neurological: Positive for speech difficulty.  All other systems reviewed and are negative.     Allergies  Lorabid; Codeine; Sulfa drugs cross reactors; Benicar; and Other  Home Medications   Prior to Admission medications   Medication Sig Start Date End Date Taking? Authorizing Provider  acetaminophen (TYLENOL) 650 MG CR tablet Take 650 mg by mouth every 8 (eight) hours as needed for pain.    Yes Historical Provider, MD  albuterol (PROVENTIL HFA;VENTOLIN HFA) 108 (90 BASE) MCG/ACT inhaler Inhale 2 puffs into the lungs every 4 (four) hours as needed for wheezing or shortness of breath. 09/07/13  Yes Leeanne Rio, MD  aspirin EC 325 MG tablet Take 325 mg by mouth daily.   Yes Historical Provider, MD  atenolol (TENORMIN) 50 MG tablet Take 50 mg by mouth 2 (two) times daily.   Yes Historical Provider, MD  calcium-vitamin D (OSCAL WITH D) 500-200 MG-UNIT per tablet Take 1 tablet by mouth 3 (three) times daily. Patient taking differently: Take 2 tablets by mouth 3 (three) times daily.  09/20/14  Yes Armandina Gemma, MD  Diclofenac Sodium 3 % GEL Place 1 application onto the skin daily as needed (ankle pain).   Yes Historical Provider, MD  furosemide (LASIX) 40 MG tablet Please continue to hold today and tomorrow, resume back on Monday Patient taking differently: Take 80 mg by mouth 2 (two) times daily.   08/23/15  Yes Albertine Patricia, MD  lansoprazole (PREVACID) 15 MG capsule Take 1 capsule (15 mg total) by mouth daily at 12 noon. 03/07/15  Yes Sharlett Iles, MD  levothyroxine (SYNTHROID, LEVOTHROID) 300 MCG tablet Take 300 mcg by mouth daily before breakfast.   Yes Historical Provider, MD  lisinopril (PRINIVIL,ZESTRIL) 10 MG tablet Take 10 mg by mouth daily.  09/09/14  Yes Historical Provider, MD  metFORMIN (GLUCOPHAGE) 1000 MG tablet Take 1 tablet (1,000 mg total) by mouth 2 (two) times daily with a meal. 04/29/14  Yes Maryann Mikhail, DO  potassium chloride SA (K-DUR,KLOR-CON) 20 MEQ tablet Take 40 mEq by mouth 2 (two) times daily.    Yes Historical Provider, MD  pravastatin (PRAVACHOL) 20 MG tablet Take 20 mg by mouth daily.  06/25/13  Yes Historical Provider, MD  Probiotic Product (PROBIOTIC PO) Take 1 tablet by mouth daily.   Yes Historical Provider, MD   BP 145/85 mmHg  Pulse 72  Temp(Src) 98.3 F (36.8 C) (Oral)  Resp 18  Ht 5\' 6"  (1.676 m)  Wt 252 lb 6 oz (114.477 kg)  BMI 40.75 kg/m2  SpO2 99% Physical Exam  Constitutional: She is oriented to person, place, and time.  Uncomfortable.   HENT:  Head: Normocephalic.  Right Ear: External ear normal.  Left Ear: External ear normal.  Mouth/Throat: Oropharynx is clear and moist.  Eyes: Conjunctivae are normal. Pupils are equal, round, and reactive to light.  Neck:  L side of neck slightly swollen and tender   Cardiovascular: Normal rate, regular rhythm and normal heart sounds.   Pulmonary/Chest: Effort normal and breath sounds normal. No respiratory distress. She has no wheezes. She has no rales.  Abdominal: Soft. Bowel sounds are normal. She exhibits no distension. There is no tenderness. There is no rebound.  Musculoskeletal: Normal range of motion. She exhibits no edema or tenderness.  Neurological: She is alert and oriented to person, place, and time. No cranial nerve deficit. Coordination normal.  CN 2- 12 intact. No  obvious facial droop. Nl strength throughout   Skin: Skin is warm and dry.  Psychiatric: She has a normal mood and affect. Her behavior is normal. Thought content normal.  Nursing note and vitals reviewed.   ED Course  Procedures (including critical care time) Labs Review Labs Reviewed  BASIC METABOLIC PANEL - Abnormal; Notable for the following:    Glucose, Bld 203 (*)    All other components within normal limits  CBC  I-STAT TROPOININ, ED    Imaging Review Dg Chest 2 View  09/13/2015  CLINICAL DATA:  Chest pain and tightness. EXAM: CHEST  2 VIEW COMPARISON:  09/18/2014 FINDINGS: The heart size and mediastinal contours  are within normal limits. Both lungs are clear. The visualized skeletal structures are unremarkable. IMPRESSION: No active cardiopulmonary disease. Electronically Signed   By: Fidela Salisbury M.D.   On: 09/13/2015 17:44   Ct Head Wo Contrast  09/13/2015  CLINICAL DATA:  Initial evaluation for acute slurred speech. EXAM: CT HEAD WITHOUT CONTRAST TECHNIQUE: Contiguous axial images were obtained from the base of the skull through the vertex without intravenous contrast. COMPARISON:  Prior CT from 04/30/2007. FINDINGS: Mild age-related cerebral atrophy with chronic small vessel ischemic disease. Scattered vascular calcifications within the carotid siphons. No acute large vessel territory infarct. No acute intracranial hemorrhage. No mass lesion, midline shift, or mass effect. No hydrocephalus. No extra-axial fluid collection. Scalp soft tissues demonstrate no acute abnormality. No acute abnormality about the globes and orbits. Paranasal sinuses are clear.  No mastoid effusion. Calvarium intact. IMPRESSION: 1. No acute intracranial process. 2. Mild age-related cerebral atrophy with chronic small vessel ischemic disease. Electronically Signed   By: Jeannine Boga M.D.   On: 09/13/2015 21:26   Ct Soft Tissue Neck W Contrast  09/13/2015  CLINICAL DATA:  LEFT neck swelling.  LEFT neck pain. BILATERAL jaw pain onset today, intermittently worse on the LEFT. EXAM: CT NECK WITH CONTRAST TECHNIQUE: Multidetector CT imaging of the neck was performed using the standard protocol following the bolus administration of intravenous contrast. CONTRAST:  12mL ISOVUE-300 IOPAMIDOL (ISOVUE-300) INJECTION 61% COMPARISON:  None. FINDINGS: Pharynx and larynx: Airway midline. No tonsillar inflammation. Nasopharyngeal adenoidal hypertrophy without mastoid effusion. Parapharyngeal fat preserved. Salivary glands: The LEFT parotid appears slightly larger than the RIGHT, and may enhance slightly in its tail segment. Correlate clinically for unilateral parotitis. No calculi. Normal submandibular glands. Thyroid: Surgically absent. Lymph nodes: No concerning lymphadenopathy to suggest recurrent thyroid cancer. Vascular: Mild atheromatous change. Limited intracranial: Negative. Visualized orbits: Negative Mastoids and visualized paranasal sinuses: Chronic opacity in the inferomedial LEFT maxillary sinus. Chronic periapical lucencies surround the LEFT maxillary molars. TMJs are located. LEFT paramedian maxillary medial incisor periapical lucency of uncertain acuity, resulting in cortical erosion. See image 23 series 7. Skeleton: Cervical spondylosis. No evidence for periodontal disease in the remaining mandibular teeth. No intrinsic mandibular abnormality. Upper chest: No masses. IMPRESSION: LEFT parotid appears slightly larger than the RIGHT, correlate clinically for mild inflammatory change in the LEFT parotid tail. LEFT paramedian medial incisor periapical lucency. Uncertain significance with regard and jaw pain. No mandibular periodontal disease or other significant abnormality. Status post thyroidectomy.  No concerning lymphadenopathy. Electronically Signed   By: Staci Righter M.D.   On: 09/13/2015 21:38   I have personally reviewed and evaluated these images and lab results as part of my medical  decision-making.   EKG Interpretation None      ED ECG REPORT I, Mishael Krysiak, the attending physician, personally viewed and interpreted this ECG.   Date: 09/13/2015  EKG Time: 17:26   Rate: 72  Rhythm: normal EKG, normal sinus rhythm  Axis: normal  Intervals:left anterior fascicular block  ST&T Change: nonspecific    MDM   Final diagnoses:  None    Kyriana Wirz is a 55 y.o. female here with chest pain, shortness of breath, slurred speech, weakness. She is at risk for ACS and for TIA/stroke. She is outside window for TPA so code stroke not activated. Called Dr. Gifford Shave from neuro, who agrees with not activating code stroke. He recommend CT head and admit for TIA workup. Also will get cardiac workup as well.   9:44 PM CT  head unremarkable. Dr. Gifford Shave saw patient and recommend inpatient TIA workup. L parotid enlarged, likely parotitis causing neck pain so I ordered clinda (likely viral though). Hospitalist to admit for TIA workup, parotitis, r/o ACS.    Wandra Arthurs, MD 09/13/15 2219

## 2015-09-13 NOTE — ED Notes (Signed)
Pt reports pt have bil jaw pain onset today intermittently worse on the L side, pt reports intermittent mid chest tightness that his chronic, pt reports SOB worse with exertion, pt denies slurred speech, reports vomited today x 1, denies diarrhea, A&O x4

## 2015-09-14 ENCOUNTER — Observation Stay (HOSPITAL_COMMUNITY): Payer: BC Managed Care – PPO

## 2015-09-14 ENCOUNTER — Observation Stay (HOSPITAL_BASED_OUTPATIENT_CLINIC_OR_DEPARTMENT_OTHER): Payer: BC Managed Care – PPO

## 2015-09-14 DIAGNOSIS — G459 Transient cerebral ischemic attack, unspecified: Secondary | ICD-10-CM | POA: Diagnosis not present

## 2015-09-14 DIAGNOSIS — R4781 Slurred speech: Secondary | ICD-10-CM | POA: Diagnosis not present

## 2015-09-14 DIAGNOSIS — K1120 Sialoadenitis, unspecified: Secondary | ICD-10-CM | POA: Diagnosis not present

## 2015-09-14 DIAGNOSIS — I209 Angina pectoris, unspecified: Secondary | ICD-10-CM | POA: Diagnosis not present

## 2015-09-14 LAB — TROPONIN I
Troponin I: <0.03 ng/mL (ref <0.031)
Troponin I: <0.03 ng/mL (ref <0.031)
Troponin I: <0.03 ng/mL (ref <0.031)

## 2015-09-14 LAB — LIPID PANEL
Cholesterol: 127 mg/dL (ref 0–200)
HDL: 35 mg/dL — AB (ref >40)
LDL CALC: 51 mg/dL (ref 0–99)
TRIGLYCERIDES: 206 mg/dL — AB (ref <150)
Total CHOL/HDL Ratio: 3.6 RATIO
VLDL: 41 mg/dL — AB (ref 0–40)

## 2015-09-14 LAB — GLUCOSE, CAPILLARY
Glucose-Capillary: 229 mg/dL — ABNORMAL HIGH (ref 65–99)
Glucose-Capillary: 155 mg/dL — ABNORMAL HIGH (ref 65–99)

## 2015-09-14 MED ORDER — RISAQUAD PO CAPS
2.0000 | ORAL_CAPSULE | Freq: Every day | ORAL | Status: DC
  Administered 2015-09-14 – 2015-09-15 (×2): 2 via ORAL
  Filled 2015-09-14 (×2): qty 2

## 2015-09-14 MED ORDER — FUROSEMIDE 80 MG PO TABS
80.0000 mg | ORAL_TABLET | Freq: Two times a day (BID) | ORAL | Status: DC
  Administered 2015-09-15: 80 mg via ORAL
  Filled 2015-09-14: qty 1

## 2015-09-14 MED ORDER — INSULIN ASPART 100 UNIT/ML ~~LOC~~ SOLN
0.0000 [IU] | Freq: Three times a day (TID) | SUBCUTANEOUS | Status: DC
  Administered 2015-09-14: 3 [IU] via SUBCUTANEOUS
  Administered 2015-09-15: 2 [IU] via SUBCUTANEOUS
  Administered 2015-09-15: 3 [IU] via SUBCUTANEOUS

## 2015-09-14 MED ORDER — CLINDAMYCIN HCL 300 MG PO CAPS
450.0000 mg | ORAL_CAPSULE | Freq: Three times a day (TID) | ORAL | Status: DC
  Administered 2015-09-14 – 2015-09-15 (×3): 450 mg via ORAL
  Filled 2015-09-14 (×4): qty 1

## 2015-09-14 MED ORDER — CLINDAMYCIN HCL 300 MG PO CAPS: 600.0000 mg | ORAL_CAPSULE | Freq: Three times a day (TID) | ORAL | Status: DC

## 2015-09-14 MED ORDER — CLOPIDOGREL BISULFATE 75 MG PO TABS
75.0000 mg | ORAL_TABLET | Freq: Every day | ORAL | Status: DC
  Administered 2015-09-14 – 2015-09-15 (×3): 75 mg via ORAL
  Filled 2015-09-14 (×4): qty 1

## 2015-09-14 MED ORDER — DIPHENHYDRAMINE HCL 25 MG PO CAPS
25.0000 mg | ORAL_CAPSULE | Freq: Three times a day (TID) | ORAL | Status: DC | PRN
  Administered 2015-09-14 (×2): 25 mg via ORAL
  Filled 2015-09-14 (×2): qty 1

## 2015-09-14 MED ORDER — SODIUM CHLORIDE 0.9 % IV SOLN
INTRAVENOUS | Status: AC
  Administered 2015-09-14: 18:00:00 via INTRAVENOUS

## 2015-09-14 NOTE — Progress Notes (Signed)
Arrived from ED. Alert and oriented. Left neck pain 8/10.  Oriented to room. Family at bedside. Call light within reach.

## 2015-09-14 NOTE — Consult Note (Signed)
Neurology Consultation Reason for Consult: Right facial droop, slurred speech Referring Physician: Dr Darl Householder    HPI: [name] is a 55 y.o. female with history of diabetes, 4.2cm thoracic aortic aneurysm, hypertension, hyperlipidemia, thyroid cancer status post thyroidectomy presented with chest pain, jaw pain, neck pain. At that time she also endorsed that she was having slurred speech and family noticed right facial droop. Upon arrival to the emergency room most of her symptoms are resolved except complaining of left jaw swelling. CT scan of the head did not show any acute abnormalities however CT of the neck soft tissues suggest left parotitis.  Past Medical History  Diagnosis Date  . Diabetes mellitus   . Hypertension   . Hypercholesteremia   . Heart murmur   . Complication of anesthesia   . PONV (postoperative nausea and vomiting)   . 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 Family history of adverse reaction to anesthesia     difficult for father to wake after surgery  . Dysrhythmia     " irregular heart rate per patient"  . Asthma     due to allergies   . Anemia     hx of   . Thyroid disease   . Cancer [facility])     Past Surgical History  Procedure Laterality Date  . Colon surgery    . Abdominal hysterectomy    . Tonsillectomy    . Knee arthroscopy    . Cholecystectomy    . Left heart catheterization with coronary angiogram N/A 04/24/2014    Procedure: LEFT HEART CATHETERIZATION WITH CORONARY ANGIOGRAM; Surgeon: Burnell Blanks, MD; Location: [facility] CATH LAB; Service: Cardiovascular; Laterality: N/A;  . Thyroidectomy N/A 09/20/2014    Procedure: TOTAL THYROIDECTOMY; Surgeon: Armandina Gemma, MD; Location: [facility] ORS; Service: General; Laterality: N/A;      Allergies  Allergen Reactions  . Lorabid [Loracarbef] Anaphylaxis  . Codeine Nausea And Vomiting  . Sulfa  Drugs Cross Reactors Other (See Comments)    Severe allergic reaction as a child - was not told symptoms  . Benicar [Olmesartan Medoxomil] Swelling and Rash  . Other Rash    Green peas    Family History  Problem Relation Age of Onset  . Heart failure Mother   . Stroke Father   . Heart failure Father   . Diabetes Mother   . Cancer Father       Prior to Admission medications   Medication Sig Start Date End Date Taking? Authorizing Provider  acetaminophen (TYLENOL) 650 MG CR tablet Take 650 mg by mouth every 8 (eight) hours as needed for pain.    Yes Historical Provider, MD  albuterol (PROVENTIL HFA;VENTOLIN HFA) 108 (90 BASE) MCG/ACT inhaler Inhale 2 puffs into the lungs every 4 (four) hours as needed for wheezing or shortness of breath. 09/07/13  Yes Leeanne Rio, MD  aspirin EC 325 MG tablet Take 325 mg by mouth daily.   Yes Historical Provider, MD  atenolol (TENORMIN) 50 MG tablet Take 50 mg by mouth 2 (two) times daily.   Yes Historical Provider, MD  calcium-vitamin D (OSCAL WITH D) 500-200 MG-UNIT per tablet Take 1 tablet by mouth 3 (three) times daily. Patient taking differently: Take 2 tablets by mouth 3 (three) times daily.  09/20/14  Yes Armandina Gemma, MD  Diclofenac Sodium 3 % GEL Place 1 application onto the skin daily as needed (ankle pain).   Yes Historical Provider, MD  furosemide (LASIX) 40 MG tablet  Please continue to hold today and tomorrow, resume back on Monday Patient taking differently: Take 80 mg by mouth 2 (two) times daily.  08/23/15  Yes Albertine Patricia, MD  lansoprazole (PREVACID) 15 MG capsule Take 1 capsule (15 mg total) by mouth daily at 12 noon. 03/07/15  Yes Sharlett Iles, MD  levothyroxine (SYNTHROID, LEVOTHROID) 300 MCG tablet Take 300 mcg by mouth daily before breakfast.   Yes Historical Provider, MD  lisinopril (PRINIVIL,ZESTRIL) 10 MG  tablet Take 10 mg by mouth daily.  09/09/14  Yes Historical Provider, MD  metFORMIN (GLUCOPHAGE) 1000 MG tablet Take 1 tablet (1,000 mg total) by mouth 2 (two) times daily with a meal. 04/29/14  Yes Maryann Mikhail, DO  potassium chloride SA (K-DUR,KLOR-CON) 20 MEQ tablet Take 40 mEq by mouth 2 (two) times daily.    Yes Historical Provider, MD  pravastatin (PRAVACHOL) 20 MG tablet Take 20 mg by mouth daily.  06/25/13  Yes Historical Provider, MD  Probiotic Product (PROBIOTIC PO) Take 1 tablet by mouth daily.   Yes Historical Provider, MD          Social History:  reports that she has never smoked. She has never used smokeless tobacco. She reports that she does not drink alcohol or use illicit drugs.   Exam: Current vital signs: BP 141/67 mmHg  Pulse 69  Temp(Src) 98.3 F (36.8 C) (Oral)  Resp 24  Ht 5\' 6"  (1.676 m)  Wt 114.477 kg (252 lb 6 oz)  BMI 40.75 kg/m2  SpO2 99% Vital signs in last 24 hours: Temp:  [98.3 F (36.8 C)] 98.3 F (36.8 C) (06/17 1718) Pulse Rate:  [68-72] 69 (06/17 2230) Resp:  [16-24] 24 (06/17 2230) BP: (127-145)/(67-85) 141/67 mmHg (06/17 2230) SpO2:  [99 %-100 %] 99 % (06/17 2230) Weight:  [114.477 kg (252 lb 6 oz)] 114.477 kg (252 lb 6 oz) (06/17 1718)  Physical Exam  Constitutional: Appears well-developed and well-nourished.  Psych: Affect appropriate to situation Eyes: No scleral injection HENT: No OP obstrucion Head: Normocephalic.  Cardiovascular: Normal rate and regular rhythm.  Respiratory: Effort normal and breath sounds normal to anterior ascultation Skin: WDI  Neuro: Mental Status: Patient is awake, alert, oriented to person, place, month, year, and situation.** Patient is able to give a clear and coherent history. No signs of aphasia or neglect Cranial Nerves: II: Visual Fields are full. Pupils are equal, round, and reactive to light.   III,IV, VI: EOMI without ptosis or diploplia.  V: Facial sensation  is symmetric to temperature VII: Mild facial asymmetry VIII: hearing is intact to voice X: Uvula elevates symmetrically XI: Shoulder shrug is symmetric. XII: tongue is midline without atrophy or fasciculations.  Motor: Tone is normal. Bulk is normal. 5/5 strength was present in all four extremities.  Sensory: Sensation is symmetric to light touch and temperature in the arms and legs.   Plantars: Toes are downgoing bilaterally.  Cerebellar: FNF intact bilaterally    I have reviewed labs in epic and the results pertinent to this consultation are: EKG showed normal sinus rhythm  I have reviewed the images obtained: CT scan of the head did not show any acute abnormality  Assessment and recommendation   [name] is a 55 y.o. female with history of diabetes, 4.2cm thoracic aortic aneurysm, hypertension, hyperlipidemia, thyroid cancer status post thyroidectomy presented with chest pain, jaw pain, neck pain. At that time she also endorsed that she was having slurred speech and family noticed right facial droop. Symptoms  of right facial droop and slurred speech now resolved probably suggestive of transient ischemic attack. Diagnoses and associated risk factors were discussed in detail with the patient. Recommend to admit patient for complete stroke workup. MRI brain and MRA neck, echocardiogram, lipid panel, hemoglobin A1c Will switch her aspirin to Plavix, continue statin therapy recommend strict management for diabetes hypertension.  Denise Joiner, MD 12:20 AM

## 2015-09-14 NOTE — Progress Notes (Signed)
OT Cancellation Note  Patient Details Name: [name] MRN: [id number] DOB: 17-Jul-1960   Cancelled Treatment:    Reason Eval/Treat Not Completed: OT screened, no needs identified, will sign off. Per PT, all pts symptoms have resolved and pt is currently at baseline. No acute OT needs identified; signing off at this time. Please re-consult if needs change.   Binnie Kand M.S., OTR/L Pager: [phone]  09/14/2015, 1:33 PM

## 2015-09-14 NOTE — Progress Notes (Signed)
PROGRESS NOTE                                                                                                                                                                                                             Patient Demographics:    [name], is a 55 y.o. female, DOB - 08-Dec-1960, [id number]  Admit date - 09/13/2015   Admitting Physician Etta Quill, DO  Outpatient Primary MD for the patient is Kristine Garbe, MD  LOS -   Chief Complaint  Patient presents with  . Jaw Pain       Brief Narrative   55 y.o. female with medical history significant of 4.2cm thoracic aortic aneurysm, DM, HTN, minimal non-occlusive CAD by cath in Jan 2016, presents with multiple complains including left facial swelling and right facial numbness, CT head significant for left parotitis, and MRI brain negative for acute CVA.   Subjective:    [name] today has, No headache, No chest pain, No abdominal pain - No Nausea,    Assessment  & Plan :    Principal Problem:   Slurred speech Active Problems:   Chest pain   Essential hypertension   Thoracic aortic aneurysm (HCC)   Parotitis  Episode of slurred speech and right facial numbness - Admitted for TIA workup, MRI brain with no acute CVA, follow on 2-D echo, follow on carotid Dopplers, LDL is 51, A1c is pending, he is on aspirin at home, changed to Plavix during hospital stay, neurology on board.  Parotitis - Encouraged to keep well-hydrated, will give gentle hydration today, will hold Lasix, encouraged to use warm compresses, head elevation, and to drink source staff, giving her allergies to sulfa and cephalosporins, will cover with clindamycin .  Chest pain - Atypical, resolved, troponins negative 3, follow on 2-D echo  Hypertension - Cont home medication  Hypercholesterolemia - Continue with statin  Hypothyroidism/status post subtotal thyroidectomy - Continue with  Synthroid  Diabetes mellitus - Continue with the metformin and insulin sliding scale    Code Status : full  Family Communication  : Husband at bedside  Disposition Plan  : home  Consults  :  neurology  Procedures  : none  DVT Prophylaxis  :  Lovenox  Lab Results  Component Value Date   PLT 283 09/13/2015    Antibiotics  :  Anti-infectives    Start     Dose/Rate Route Frequency Ordered Stop   09/14/15 0016  clindamycin (CLEOCIN) capsule 300 mg     300 mg Oral Every 8 hours 09/13/15 2303     09/13/15 2145  clindamycin (CLEOCIN) IVPB 600 mg     600 mg 100 mL/hr over 30 Minutes Intravenous  Once 09/13/15 2144 09/13/15 2259        Objective:   Filed Vitals:   09/14/15 0800 09/14/15 1000 09/14/15 1200 09/14/15 1527  BP:  130/82 136/82 128/61  Pulse: 64 65 66 63  Temp: 98.4 F (36.9 C) 98.8 F (37.1 C) 98.8 F (37.1 C) 97.9 F (36.6 C)  TempSrc: Oral Oral Oral Oral  Resp: 18 20  20   Height:      Weight:      SpO2: 98%  98% 98%    Wt Readings from Last 3 Encounters:  09/14/15 117.346 kg (258 lb 11.2 oz)  08/21/15 114.76 kg (253 lb)  10/07/14 119.296 kg (263 lb)     Intake/Output Summary (Last 24 hours) at 09/14/15 1603 Last data filed at 09/14/15 [id number]  Gross per 24 hour  Intake   1000 ml  Output      1 ml  Net    999 ml     Physical Exam  Awake Alert, Oriented X 3,  Supple Neck,No JVD,Left facial swelling and erythema at the [facility] area, mild tenderness to palpation Symmetrical Chest wall movement, Good air movement bilaterally, CTAB RRR,No Gallops,Rubs or new Murmurs, No Parasternal Heave +ve B.Sounds, Abd Soft, No tenderness,  No rebound - guarding or rigidity. No Cyanosis, Clubbing or edema, No new Rash or bruise      Data Review:    CBC  Recent Labs Lab 09/13/15 1734  WBC 6.4  HGB 12.4  HCT 39.1  PLT 283  MCV 87.3  MCH 27.7  MCHC 31.7  RDW 14.0    Chemistries   Recent Labs Lab 09/13/15 1734  NA 140  K 3.7  CL 106   CO2 25  GLUCOSE 203*  BUN 9  CREATININE 0.93  CALCIUM 9.0   ------------------------------------------------------------------------------------------------------------------  Recent Labs  09/14/15 0433  CHOL 127  HDL 35*  LDLCALC 51  TRIG 206*  CHOLHDL 3.6    Lab Results  Component Value Date   HGBA1C 7.4* 04/23/2014   ------------------------------------------------------------------------------------------------------------------ No results for input(s): TSH, T4TOTAL, T3FREE, THYROIDAB in the last 72 hours.  Invalid input(s): FREET3 ------------------------------------------------------------------------------------------------------------------ No results for input(s): VITAMINB12, FOLATE, FERRITIN, TIBC, IRON, RETICCTPCT in the last 72 hours.  Coagulation profile No results for input(s): INR, PROTIME in the last 168 hours.  No results for input(s): DDIMER in the last 72 hours.  Cardiac Enzymes  Recent Labs Lab 09/14/15 0039 09/14/15 0433 09/14/15 1003  TROPONINI <0.03 <0.03 <0.03   ------------------------------------------------------------------------------------------------------------------    Component Value Date/Time   BNP 139.2* 04/23/2014 1308    Inpatient Medications  Scheduled Meds: . atenolol  50 mg Oral BID  . clindamycin  300 mg Oral Q8H  . clopidogrel  75 mg Oral Daily  . enoxaparin (LOVENOX) injection  40 mg Subcutaneous Q24H  . furosemide  80 mg Oral BID  . levothyroxine  300 mcg Oral QAC breakfast  . lisinopril  10 mg Oral Daily  . metFORMIN  1,000 mg Oral BID WC  . pantoprazole  20 mg Oral Daily  . potassium chloride SA  40 mEq Oral BID  . pravastatin  20 mg Oral Daily  Continuous Infusions:  PRN Meds:.acetaminophen, albuterol, diphenhydrAMINE  Micro Results No results found for this or any previous visit (from the past 240 hour(s)).  Radiology Reports Dg Chest 2 View  09/13/2015  CLINICAL DATA:  Chest pain and  tightness. EXAM: CHEST  2 VIEW COMPARISON:  09/18/2014 FINDINGS: The heart size and mediastinal contours are within normal limits. Both lungs are clear. The visualized skeletal structures are unremarkable. IMPRESSION: No active cardiopulmonary disease. Electronically Signed   By: Fidela Salisbury M.D.   On: 09/13/2015 17:44   Ct Head Wo Contrast  09/13/2015  CLINICAL DATA:  Initial evaluation for acute slurred speech. EXAM: CT HEAD WITHOUT CONTRAST TECHNIQUE: Contiguous axial images were obtained from the base of the skull through the vertex without intravenous contrast. COMPARISON:  Prior CT from 04/30/2007. FINDINGS: Mild age-related cerebral atrophy with chronic small vessel ischemic disease. Scattered vascular calcifications within the carotid siphons. No acute large vessel territory infarct. No acute intracranial hemorrhage. No mass lesion, midline shift, or mass effect. No hydrocephalus. No extra-axial fluid collection. Scalp soft tissues demonstrate no acute abnormality. No acute abnormality about the globes and orbits. Paranasal sinuses are clear.  No mastoid effusion. Calvarium intact. IMPRESSION: 1. No acute intracranial process. 2. Mild age-related cerebral atrophy with chronic small vessel ischemic disease. Electronically Signed   By: Jeannine Boga M.D.   On: 09/13/2015 21:26   Ct Soft Tissue Neck W Contrast  09/13/2015  CLINICAL DATA:  LEFT neck swelling. LEFT neck pain. BILATERAL jaw pain onset today, intermittently worse on the LEFT. EXAM: CT NECK WITH CONTRAST TECHNIQUE: Multidetector CT imaging of the neck was performed using the standard protocol following the bolus administration of intravenous contrast. CONTRAST:  7mL ISOVUE-300 IOPAMIDOL (ISOVUE-300) INJECTION 61% COMPARISON:  None. FINDINGS: Pharynx and larynx: Airway midline. No tonsillar inflammation. Nasopharyngeal adenoidal hypertrophy without mastoid effusion. Parapharyngeal fat preserved. Salivary glands: The LEFT parotid  appears slightly larger than the RIGHT, and may enhance slightly in its tail segment. Correlate clinically for unilateral parotitis. No calculi. Normal submandibular glands. Thyroid: Surgically absent. Lymph nodes: No concerning lymphadenopathy to suggest recurrent thyroid cancer. Vascular: Mild atheromatous change. Limited intracranial: Negative. Visualized orbits: Negative Mastoids and visualized paranasal sinuses: Chronic opacity in the inferomedial LEFT maxillary sinus. Chronic periapical lucencies surround the LEFT maxillary molars. TMJs are located. LEFT paramedian maxillary medial incisor periapical lucency of uncertain acuity, resulting in cortical erosion. See image 23 series 7. Skeleton: Cervical spondylosis. No evidence for periodontal disease in the remaining mandibular teeth. No intrinsic mandibular abnormality. Upper chest: No masses. IMPRESSION: LEFT parotid appears slightly larger than the RIGHT, correlate clinically for mild inflammatory change in the LEFT parotid tail. LEFT paramedian medial incisor periapical lucency. Uncertain significance with regard and jaw pain. No mandibular periodontal disease or other significant abnormality. Status post thyroidectomy.  No concerning lymphadenopathy. Electronically Signed   By: Staci Righter M.D.   On: 09/13/2015 21:38   Mr Brain Wo Contrast  09/14/2015  CLINICAL DATA:  Initial evaluation for slurred speech with right facial droop. EXAM: MRI HEAD WITHOUT CONTRAST MRA HEAD WITHOUT CONTRAST TECHNIQUE: Multiplanar, multiecho pulse sequences of the brain and surrounding structures were obtained without intravenous contrast. Angiographic images of the head were obtained using MRA technique without contrast. COMPARISON:  Prior CT from 09/13/2015. FINDINGS: MRI HEAD FINDINGS Age-related cerebral volume loss present. Few scattered patchy T2/FLAIR hyperintense foci noted within the white matter both cerebral hemispheres, nonspecific, but most likely related to  chronic small vessel ischemic disease. No abnormal foci  of restricted diffusion to suggest acute infarct. Gray-white matter differentiation maintained. Major intracranial vascular flow voids are preserved. No acute or chronic intracranial hemorrhage. No areas of chronic infarction. No mass lesion, midline shift, or mass effect. No hydrocephalus. No extra-axial fluid collection. Major dural sinuses are grossly patent. Craniocervical junction normal. Mild degenerative spondylolysis noted at the C4-5 level without significant stenosis. Remainder the visualized upper cervical spine unremarkable. Pituitary gland demonstrates no acute abnormality. 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 This is of doubtful significance. No acute abnormality about the globes and orbits. Paranasal sinuses are clear. Trace mucosal thickening within the right mastoid air cells. Mastoid air cells are otherwise clear. Inner ear structures grossly normal. Bone marrow signal intensity within normal limits. No scalp soft tissue abnormality. MRA HEAD FINDINGS ANTERIOR CIRCULATION: Distal cervical segments of the internal carotid arteries are patent with antegrade flow. Petrous, cavernous, and supraclinoid segments of the internal carotid arteries are patent with antegrade flow. A1 segments, anterior communicating artery, and anterior cerebral arteries well opacified. M1 segments patent without stenosis or occlusion. MCA bifurcations within normal limits. Distal MCA branches well opacified and symmetric. POSTERIOR CIRCULATION: Vertebral arteries patent to the vertebrobasilar junction. Posterior inferior cerebral arteries patent bilaterally. Basilar artery widely patent. Superior cerebellar and posterior cerebral arteries patent bilaterally. Small bilateral posterior communicating arteries noted. No aneurysm or vascular  malformation. IMPRESSION: MRI HEAD IMPRESSION: 1. No acute intracranial infarct or other process identified. 2. Age appropriate cerebral atrophy with mild chronic small vessel ischemic disease. MRA HEAD IMPRESSION: Normal intracranial MRA. Electronically Signed   By: Jeannine Boga M.D.   On: 09/14/2015 03:51   Dg Abd 2 Views  08/21/2015  CLINICAL DATA:  Abdominal pain, nausea, vomiting and diarrhea starting at 4 a.m. History of diabetes. History of thyroid cancer status post resection. EXAM: ABDOMEN - 2 VIEW COMPARISON:  None. FINDINGS: Bowel gas pattern is nonobstructive. No evidence of soft tissue mass or abnormal fluid collection. No evidence of free intraperitoneal air. Cholecystectomy clips in the right upper quadrant. Mild degenerative change in the lower lumbar spine. No acute- appearing osseous abnormality. Lung bases are grossly clear. IMPRESSION: Nonobstructive bowel gas pattern and no evidence of acute intra-abdominal abnormality. Electronically Signed   By: Franki Cabot M.D.   On: 08/21/2015 21:34   Mr Jodene Nam Head/brain Wo Cm  09/14/2015  CLINICAL DATA:  Initial evaluation for slurred speech with right facial droop. EXAM: MRI HEAD WITHOUT CONTRAST MRA HEAD WITHOUT CONTRAST TECHNIQUE: Multiplanar, multiecho pulse sequences of the brain and surrounding structures were obtained without intravenous contrast. Angiographic images of the head were obtained using MRA technique without contrast. COMPARISON:  Prior CT from 09/13/2015. FINDINGS: MRI HEAD FINDINGS Age-related cerebral volume loss present. Few scattered patchy T2/FLAIR hyperintense foci noted within the white matter both cerebral hemispheres, nonspecific, but most likely related to chronic small vessel ischemic disease. No abnormal foci of restricted diffusion to suggest acute infarct. Gray-white matter differentiation maintained. Major intracranial vascular flow voids are preserved. No acute or chronic intracranial hemorrhage. No areas of  chronic infarction. No mass lesion, midline shift, or mass effect. No hydrocephalus. No extra-axial fluid collection. Major dural sinuses are grossly patent. Craniocervical junction normal. Mild degenerative spondylolysis noted at the C4-5 level without significant stenosis. Remainder the visualized upper cervical spine unremarkable. Pituitary gland demonstrates no acute abnormality. 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 This is of doubtful significance. No acute abnormality about the globes and orbits. Paranasal sinuses are clear. Trace mucosal thickening within the right mastoid air cells. Mastoid air cells are otherwise clear. Inner ear structures grossly normal. Bone marrow signal intensity within normal limits. No scalp soft tissue abnormality. MRA HEAD FINDINGS ANTERIOR CIRCULATION: Distal cervical segments of the internal carotid arteries are patent with antegrade flow. Petrous, cavernous, and supraclinoid segments of the internal carotid arteries are patent with antegrade flow. A1 segments, anterior communicating artery, and anterior cerebral arteries well opacified. M1 segments patent without stenosis or occlusion. MCA bifurcations within normal limits. Distal MCA branches well opacified and symmetric. POSTERIOR CIRCULATION: Vertebral arteries patent to the vertebrobasilar junction. Posterior inferior cerebral arteries patent bilaterally. Basilar artery widely patent. Superior cerebellar and posterior cerebral arteries patent bilaterally. Small bilateral posterior communicating arteries noted. No aneurysm or vascular malformation. IMPRESSION: MRI HEAD IMPRESSION: 1. No acute intracranial infarct or other process identified. 2. Age appropriate cerebral atrophy with mild chronic small vessel ischemic disease. MRA HEAD IMPRESSION: Normal intracranial MRA. Electronically Signed   By:  Jeannine Boga M.D.   On: 09/14/2015 03:51    Time Spent in minutes  25 minutes   Karthik Whittinghill M.D on 09/14/2015 at 4:03 PM  Between 7am to 7pm - Pager - [phone]  After 7pm go to [email or web address] - password [facility]  [facility] -  Office  [phone]

## 2015-09-14 NOTE — Evaluation (Signed)
Physical Therapy Evaluation Patient Details Name: [name] MRN: [id number] DOB: 1960/05/23 Today's Date: 09/14/2015   History of Present Illness  55 y.o. female with history of diabetes, 4.2cm thoracic aortic aneurysm, hypertension, hyperlipidemia, thyroid cancer status post thyroidectomy presented with chest pain, jaw pain, neck pain. At that time she also endorsed that she was having slurred speech and family noticed right facial droop  Clinical Impression  Patient seen for mobility assessment, mobilizing well. Tolerated in hall ambulation, stair negotiation and functional tasks without any need for physical assist. At this time, patient reports at her baseline for mobility. No further Acute PT needs, will sign off.    Follow Up Recommendations No PT follow up    Equipment Recommendations  None recommended by PT    Recommendations for Other Services       Precautions / Restrictions Precautions Precautions: Fall Restrictions Weight Bearing Restrictions: No      Mobility  Bed Mobility Overal bed mobility: Modified Independent             General bed mobility comments: no assist needed increased time at baseline  Transfers Overall transfer level: Independent                  Ambulation/Gait Ambulation/Gait assistance: Independent Ambulation Distance (Feet): 280 Feet Assistive device: None Gait Pattern/deviations:  (increased alteral sway at baseline) Gait velocity: decreased Gait velocity interpretation: Below normal speed for age/gender General Gait Details: steady with mobility  Stairs Stairs: Yes Stairs assistance: Modified independent (Device/Increase time) Stair Management: One rail Left Number of Stairs: 3 General stair comments: steady with stairs, modest DOE   Wheelchair Mobility    Modified Rankin (Stroke Patients Only) Modified Rankin (Stroke Patients Only) Pre-Morbid Rankin Score: No significant disability Modified Rankin: No  significant disability     Balance Overall balance assessment: Modified Independent                               Standardized Balance Assessment Standardized Balance Assessment : Dynamic Gait Index   Dynamic Gait Index Level Surface: Mild Impairment Change in Gait Speed: Mild Impairment Gait with Horizontal Head Turns: Mild Impairment Gait with Vertical Head Turns: Mild Impairment Gait and Pivot Turn: Mild Impairment Step Over Obstacle: Mild Impairment Step Around Obstacles: Mild Impairment Steps: Moderate Impairment Total Score: 15       Pertinent Vitals/Pain Pain Assessment: Faces Faces Pain Scale: Hurts a little bit Pain Location: left neck region.[name] Pain Descriptors / Indicators: Sore Pain Intervention(s): Monitored during session    Home Living Family/patient expects to be discharged to:: Private residence Living Arrangements: Spouse/significant other Available Help at Discharge: Family Type of Home: House Home Access: Stairs to enter Entrance Stairs-Rails: None Technical brewer of Steps: 2 Home Layout: One level Home Equipment: Cane - single point      Prior Function Level of Independence: Independent         Comments: ocassional use of a cane after a long day at work     Hand Dominance   Dominant Hand: Left    Extremity/Trunk Assessment   Upper Extremity Assessment: Overall WFL for tasks assessed           Lower Extremity Assessment: Overall WFL for tasks assessed         Communication   Communication: No difficulties  Cognition Arousal/Alertness: Awake/alert Behavior During Therapy: WFL for tasks assessed/performed Overall Cognitive Status: Within Functional Limits for tasks assessed  General Comments General comments (skin integrity, edema, etc.): educated on energy conservation strategies. patient receptive    Exercises        Assessment/Plan    PT Assessment Patent does  not need any further PT services  PT Diagnosis Difficulty walking   PT Problem List    PT Treatment Interventions     PT Goals (Current goals can be found in the Care Plan section) Acute Rehab PT Goals PT Goal Formulation: All assessment and education complete, DC therapy    Frequency     Barriers to discharge        Co-evaluation               End of Session   Activity Tolerance: No increased pain Patient left: in bed;with call bell/phone within reach Nurse Communication: Mobility status    Functional Assessment Tool Used: clinical judgement Functional Limitation: Mobility: Walking and moving around Mobility: Walking and Moving Around Current Status [id number]): At least 1 percent but less than 20 percent impaired, limited or restricted Mobility: Walking and Moving Around Goal Status [phone]): At least 1 percent but less than 20 percent impaired, limited or restricted Mobility: Walking and Moving Around Discharge Status [phone]): At least 1 percent but less than 20 percent impaired, limited or restricted    Time: 0904-0925 PT Time Calculation (min) (ACUTE ONLY): 21 min   Charges:   PT Evaluation $PT Eval Low Complexity: 1 Procedure     PT G Codes:   PT G-Codes **NOT FOR INPATIENT CLASS** Functional Assessment Tool Used: clinical judgement Functional Limitation: Mobility: Walking and moving around Mobility: Walking and Moving Around Current Status [id number]): At least 1 percent but less than 20 percent impaired, limited or restricted Mobility: Walking and Moving Around Goal Status [phone]): At least 1 percent but less than 20 percent impaired, limited or restricted Mobility: Walking and Moving Around Discharge Status [phone]): At least 1 percent but less than 20 percent impaired, limited or restricted    Duncan Dull 09/14/2015, 9:32 AM  Alben Deeds, PT DPT  [phone]

## 2015-09-14 NOTE — Progress Notes (Signed)
VASCULAR LAB PRELIMINARY  PRELIMINARY  PRELIMINARY  PRELIMINARY  Carotid duplex completed.    Preliminary report:  1-39% ICA plaquing.  Vertebral artery flow is antegrade.   Odie Rauen, RVT 09/14/2015, 4:20 PM

## 2015-09-14 NOTE — Progress Notes (Addendum)
STROKE TEAM PROGRESS NOTE   HISTORY OF PRESENT ILLNESS (per record) [name] is a 55 y.o. female with history of diabetes, 4.2cm thoracic aortic aneurysm, hypertension, hyperlipidemia, thyroid cancer status post thyroidectomy presented with chest pain, jaw pain, neck pain. At that time she also endorsed that she was having slurred speech and family noticed right facial droop. Upon arrival to the emergency room most of her symptoms are resolved except complaining of left jaw swelling. CT scan of the head did not show any acute abnormalities however CT of the neck soft tissues suggest left parotitis.   SUBJECTIVE (INTERVAL HISTORY) Her family member is at the bedside.  Overall she feels her condition is stable. She has obvious left cheek swelling. She stated painful on palpation, consistent with parotiditis. Right nasolabial fold mild flattening likely due to left nasolabial fold increased due to left cheek swelling. MRI and MRA negative.    OBJECTIVE Temp:  [97.3 F (36.3 C)-98.6 F (37 C)] 97.3 F (36.3 C) (06/18 0555) Pulse Rate:  [65-72] 65 (06/18 0500) Cardiac Rhythm:  [-] Normal sinus rhythm (06/17 2001) Resp:  [16-24] 18 (06/18 0555) BP: (122-157)/(60-85) 131/60 mmHg (06/18 0555) SpO2:  [98 %-100 %] 98 % (06/18 0555) Weight:  [114.477 kg (252 lb 6 oz)-117.346 kg (258 lb 11.2 oz)] 117.346 kg (258 lb 11.2 oz) (06/18 0000)  CBC:  Recent Labs Lab 09/13/15 1734  WBC 6.4  HGB 12.4  HCT 39.1  MCV 87.3  PLT [id number]    Basic Metabolic Panel:  Recent Labs Lab 09/13/15 1734  NA 140  K 3.7  CL 106  CO2 25  GLUCOSE 203*  BUN 9  CREATININE 0.93  CALCIUM 9.0    Lipid Panel:    Component Value Date/Time   CHOL 127 09/14/2015 0433   TRIG 206* 09/14/2015 0433   HDL 35* 09/14/2015 0433   CHOLHDL 3.6 09/14/2015 0433   VLDL 41* 09/14/2015 0433   LDLCALC 51 09/14/2015 0433   HgbA1c:  Lab Results  Component Value Date   HGBA1C 7.4* 04/23/2014   Urine Drug Screen: No  results found for: LABOPIA, COCAINSCRNUR, LABBENZ, AMPHETMU, THCU, LABBARB    IMAGING I have personally reviewed the radiological images below and agree with the radiology interpretations.  Dg Chest 2 View 09/13/2015   No active cardiopulmonary disease.   Ct Head Wo Contrast 09/13/2015   1. No acute intracranial process.  2. Mild age-related cerebral atrophy with chronic small vessel ischemic disease.   Ct Soft Tissue Neck W Contrast 09/13/2015   LEFT parotid appears slightly larger than the RIGHT, correlate clinically for mild inflammatory change in the LEFT parotid tail. LEFT paramedian medial incisor periapical lucency. Uncertain significance with regard and jaw pain. No mandibular periodontal disease or other significant abnormality. Status post thyroidectomy.  No concerning lymphadenopathy.   Mr Jodene Nam Head/brain Wo Cm 09/14/2015    MRI HEAD IMPRESSION:  1. No acute intracranial infarct or other process identified.  2. Age appropriate cerebral atrophy with mild chronic small vessel ischemic disease.   MRA HEAD IMPRESSION:  Normal intracranial MRA.    CUS - 1-39% ICA plaquing. Vertebral artery flow is antegrade.  TTE - pending   PHYSICAL EXAM  Temp:  [97.3 F (36.3 C)-98.8 F (37.1 C)] 97.9 F (36.6 C) (06/18 1527) Pulse Rate:  [63-72] 63 (06/18 1527) Resp:  [16-24] 20 (06/18 1527) BP: (122-157)/(60-85) 128/61 mmHg (06/18 1527) SpO2:  [98 %-100 %] 98 % (06/18 1527) Weight:  [252 lb 6 oz (114.477  kg)-258 lb 11.2 oz (117.346 kg)] 258 lb 11.2 oz (117.346 kg) (06/18 0000)  General - Well nourished, well developed, in no apparent distress.  Ophthalmologic - SFundi not visualized due to eye movement.  Cardiovascular - Regular rate and rhythm.  Neck - left cheek and neck swelling with tenderness on palpation.   Mental Status -  Level of arousal and orientation to time, place, and person were intact. Language including expression, naming, repetition, comprehension was  assessed and found intact. Fund of Knowledge was assessed and was intact.  Cranial Nerves II - XII - II - Visual field intact OU. III, IV, VI - Extraocular movements intact. V - Facial sensation intact bilaterally. VII - Right nasolabial fold mild flattening likely due to left nasolabial fold increased due to left cheek swelling. VIII - Hearing & vestibular intact bilaterally. X - Palate elevates symmetrically. XI - Chin turning & shoulder shrug intact bilaterally. XII - Tongue protrusion intact.  Motor Strength - The patient's strength was normal in all extremities and pronator drift was absent.  Bulk was normal and fasciculations were absent.   Motor Tone - Muscle tone was assessed at the neck and appendages and was normal.  Reflexes - The patient's reflexes were 1+ in all extremities and she had no pathological reflexes.  Sensory - Light touch, temperature/pinprick, vibration and proprioception, and Romberg testing were assessed and were symmetrical.    Coordination - The patient had normal movements in the hands and feet with no ataxia or dysmetria.  Tremor was absent.  Gait and Station - The patient's transfers, posture, gait, station, and turns were observed as normal.   ASSESSMENT/PLAN [name] is a 55 y.o. female with history of diabetes mellitus, hypertension, hyperlipidemia, asthma, thoracic aortic aneurysm, thyroid cancer with thyroidectomy, and history of irregular heartbeat presenting with slurred speech and right facial droop with chest, jaw, and neck pain. She did not receive IV t-PA due to resolution of deficits.  Left parotiditis - no acute stroke  Resultant - Right nasolabial fold mild flattening likely due to left nasolabial fold increased due to left cheek swelling.  CT neck soft tissue showed mild left parotid enlargement   MRI - No acute intracranial infarct or other process identified.   MRA - normal  Carotid Doppler - unremarkable  2D Echo -  pending  LDL - 51  HgbA1c pending  VTE prophylaxis - Lovenox  Diet Heart Room service appropriate?: Yes; Fluid consistency:: Thin  aspirin 325 mg daily prior to admission, now on clopidogrel 75 mg daily. Continue plavix at discharge.  Patient counseled to be compliant with her antithrombotic medications  Ongoing aggressive stroke risk factor management  Therapy recommendations: No follow-up physical therapy recommended.  Disposition:  Pending  Hypertension  Stable  BP goal normotensive  Hyperlipidemia  Home meds:  Pravachol 20 mg daily resumed in hospital  LDL 51, goal < 70  Continue statin at discharge  Diabetes  HgbA1c pending, goal < 7.0  CBG monitoring  DM not in good controlled  SSI  On metformin  Other Stroke Risk Factors  Obesity, Body mass index is 41.78 kg/(m^2)., recommend weight loss, diet and exercise as appropriate   Family hx stroke (Father)  Other Active Problems  Mild left parotid enlargement on MRI  Hospital day # 1  Neurology will sign off. Please call with questions. No neuro follow up needed at this time. Thanks for the consult.  Rosalin Hawking, MD PhD Stroke Neurology 09/14/2015 4:16 PM  To contact Stroke Continuity provider, please refer to [email or web address] After hours, contact General Neurology

## 2015-09-15 ENCOUNTER — Observation Stay (HOSPITAL_BASED_OUTPATIENT_CLINIC_OR_DEPARTMENT_OTHER): Payer: BC Managed Care – PPO

## 2015-09-15 DIAGNOSIS — G459 Transient cerebral ischemic attack, unspecified: Secondary | ICD-10-CM

## 2015-09-15 DIAGNOSIS — I10 Essential (primary) hypertension: Secondary | ICD-10-CM

## 2015-09-15 DIAGNOSIS — R4781 Slurred speech: Secondary | ICD-10-CM

## 2015-09-15 DIAGNOSIS — E119 Type 2 diabetes mellitus without complications: Secondary | ICD-10-CM

## 2015-09-15 DIAGNOSIS — K1120 Sialoadenitis, unspecified: Secondary | ICD-10-CM

## 2015-09-15 LAB — ECHOCARDIOGRAM COMPLETE
CHL CUP MV DEC (S): 285
E/e' ratio: 7.8
EWDT: 285 ms
FS: 30 % (ref 28–44)
HEIGHTINCHES: 66 in
IV/PV OW: 1
LA vol index: 44.7 mL/m2
LADIAMINDEX: 1.42 cm/m2
LASIZE: 34 mm
LAVOL: 107 mL
LAVOLA4C: 119 mL
LEFT ATRIUM END SYS DIAM: 34 mm
LV E/e'average: 7.8
LV PW d: 13 mm — AB (ref 0.6–1.1)
LVEEMED: 7.8
LVELAT: 12.3 cm/s
LVOT area: 3.14 cm2
LVOT diameter: 20 mm
Lateral S' vel: 17.4 cm/s
MV Peak grad: 4 mmHg
MV pk E vel: 96 m/s
MVPKAVEL: 90.2 m/s
TDI e' lateral: 12.3
TDI e' medial: 7.07
WEIGHTICAEL: 4139.2 [oz_av]

## 2015-09-15 LAB — HEMOGLOBIN A1C
Hgb A1c MFr Bld: 7.5 % — ABNORMAL HIGH (ref 4.8–5.6)
Mean Plasma Glucose: 169 mg/dL

## 2015-09-15 LAB — GLUCOSE, CAPILLARY
GLUCOSE-CAPILLARY: 232 mg/dL — AB (ref 65–99)
GLUCOSE-CAPILLARY: 199 mg/dL — AB (ref 65–99)
Glucose-Capillary: 176 mg/dL — ABNORMAL HIGH (ref 65–99)

## 2015-09-15 MED ORDER — AMOXICILLIN-POT CLAVULANATE 875-125 MG PO TABS: 1.0000 | ORAL_TABLET | Freq: Two times a day (BID) | ORAL | Status: DC

## 2015-09-15 MED ORDER — CLOPIDOGREL BISULFATE 75 MG PO TABS: 75.0000 mg | ORAL_TABLET | Freq: Every day | ORAL | Status: DC

## 2015-09-15 NOTE — Evaluation (Signed)
Speech Language Pathology Evaluation Patient Details Name: [name] MRN: [id number] DOB: Mar 30, 1960 Today's Date: 09/15/2015 Time: [id number] SLP Time Calculation (min) (ACUTE ONLY): 27 min  Problem List:  Patient Active Problem List   Diagnosis Date Noted  . Slurred speech 09/13/2015  . Parotitis 09/13/2015  . Gastroenteritis 08/21/2015  . Hypothyroid 08/21/2015  . 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 Pain in the chest   . Essential hypertension   . Thoracic aortic aneurysm ([place])   . Chest pain 04/23/2014  . Anginal pain ([place]) 04/23/2014  . Type 2 diabetes mellitus ([place])   . Hypertension   . Hypercholesteremia    Past Medical History:  Past Medical History  Diagnosis Date  . Diabetes mellitus   . Hypertension   . Hypercholesteremia   . Heart murmur   . Complication of anesthesia   . PONV (postoperative nausea and vomiting)   . 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 Family history of adverse reaction to anesthesia     difficult for father to wake after surgery  . Dysrhythmia     " irregular heart rate per patient"  . Asthma     due to allergies   . Anemia     hx of   . Thyroid disease   . Cancer [facility])    Past Surgical History:  Past Surgical History  Procedure Laterality Date  . Colon surgery    . Abdominal hysterectomy    . Tonsillectomy    . Knee arthroscopy    . Cholecystectomy    . Left heart catheterization with coronary angiogram N/A 04/24/2014    Procedure: LEFT HEART CATHETERIZATION WITH CORONARY ANGIOGRAM;  Surgeon: Burnell Blanks, MD;  Location: [facility] C[facility] LAB;  Service: Cardiovascular;  Laterality: N/A;  . Thyroidectomy N/A 09/20/2014    Procedure: TOTAL THYROIDECTOMY;  Surgeon: Armandina Gemma, MD;  Location: [facility] ORS;  Service: General;  Laterality: N/A;   HPI:  Pt is a 55 y.o. 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 Patient presented to the ED 6/17 after an  episode of L sided chest pain, neck pain, and jaw pain. There was apparently also an episode of slurred speech and R sided facial droop occuring PTA. By the time she hit triage her facial droop had resolved and has remained resolved, although pt continued to have some L side facial tenderness. MRI showed no acute infarct. Pt passed RN stroke swallow screen. SLP eval ordered as part of stroke workup.    Assessment / Plan / Recommendation Clinical Impression  Pt showed cognitive-linguistic and motor speech skills within functional limits for tasks assessed. Pt reported initially having slurred speech and some numbness on one side when initially admitted; however, symptoms have resolved. Reported occasionally needing to "pause and think" before speaking but overall feeling "back to normal". No difficulties with selective attention to conversation (TV on in background), no difficulties recalling previous therapy visits and events leading to admission. SLP will sign off at this time; please re-consult if needs arise.    SLP Assessment  Patient does not need any further Speech Lanaguage Pathology Services    Follow Up Recommendations  None    Frequency and Duration           SLP Evaluation Prior Functioning  Cognitive/Linguistic Baseline: Within functional limits Type of Home: House   Cognition  Overall Cognitive Status: Within Functional Limits for  tasks assessed Arousal/Alertness: Awake/alert Orientation Level: Oriented X4 Attention: Sustained;Selective Sustained Attention: Appears intact Selective Attention: Appears intact Memory: Appears intact Awareness: Appears intact Problem Solving: Appears intact Executive Function: Sequencing Sequencing: Appears intact Safety/Judgment: Appears intact    Comprehension  Auditory Comprehension Overall Auditory Comprehension: Appears within functional limits for tasks assessed Yes/No Questions: Within Functional Limits Conversation: Complex     Expression Expression Primary Mode of Expression: Verbal Verbal Expression Overall Verbal Expression: Appears within functional limits for tasks assessed Initiation: No impairment Level of Generative/Spontaneous Verbalization: Conversation Naming: No impairment Pragmatics: No impairment Non-Verbal Means of Communication: Not applicable   Oral / Motor  Oral Motor/Sensory Function Overall Oral Motor/Sensory Function: Within functional limits Motor Speech Overall Motor Speech: Appears within functional limits for tasks assessed Respiration: Within functional limits Phonation: Normal Resonance: Within functional limits Articulation: Within functional limitis Intelligibility: Intelligible Motor Planning: Witnin functional limits Motor Speech Errors: Not applicable   GO          Functional Assessment Tool Used: clinical judgment Functional Limitations: Spoken language expressive Spoken Language Expression Current Status [phone]): 0 percent impaired, limited or restricted Spoken Language Expression Goal Status [id number]): 0 percent impaired, limited or restricted Spoken Language Expression Discharge Status [phone]): 0 percent impaired, limited or restricted         Kern Reap, [place], CCC-SLP 09/15/2015, 2:08 PM [phone]

## 2015-09-15 NOTE — Progress Notes (Signed)
  Echocardiogram 2D Echocardiogram has been performed.  Diamond Nickel 09/15/2015, 10:52 AM

## 2015-09-15 NOTE — Progress Notes (Signed)
Discharge instruction reviewed with patient/family. All questions answered at this time. Transport home by family.  Harlow Basley, RN 

## 2015-09-15 NOTE — Discharge Summary (Signed)
Discharge Summary  [name] [id number] DOB: [birth year]  PCP: Denise Garbe, MD  Admit date: 09/13/2015 Discharge date: 09/15/2015  Time spent: >29mins  Recommendations for Outpatient Follow-up:  1. F/u with PMD within a week  for hospital discharge follow up, repeat cbc/bmp at follow up, follow up on resolution of parostitis, consider refer to ENT if symptom persist 2. pmd to follow up on final carotid US report, prelim report unremarkable   Discharge Diagnoses:  Active Hospital Problems   Diagnosis Date Noted  . Slurred speech 09/13/2015  . Parotitis 09/13/2015  . Essential hypertension   . Thoracic aortic aneurysm ([place])   . Chest pain 04/23/2014    Resolved Hospital Problems   Diagnosis Date Noted Date Resolved  No resolved problems to display.    Discharge Condition: stable  Diet recommendation: heart healthy/carb modified  Filed Weights   09/13/15 1718 09/14/15 0000  Weight: 114.477 kg (252 lb 6 oz) 117.346 kg (258 lb 11.2 oz)    History of present illness:  HPI: [name] is a 55 y.o. 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 Patient presents to the ED after an episode of L sided chest pain, neck pain, and jaw pain. There was apparently also an episode of slurred speech and R sided facial droop occuring PTA. By the time she hit triage her facial droop had resolved and has remained resolved. Slurred speech was noted around 12:30 PM. One episode of vomiting earlier today.  ED Course: By the time she is being evaluated by the EDP, her CP is completely resolved as is any facial droop or neuro symptoms. She still has some L sided facial tenderness which was determined by CT scan to perhaps be due to some mild parotitis. CT head and neck is otherwise unremarkable. Trop negative.    Hospital Course:  Principal Problem:   Slurred speech Active Problems:   Chest  pain   Essential hypertension   Thoracic aortic aneurysm ([facility])   Parotitis   Episode of slurred speech and right facial numbness - Admitted for TIA workup, MRI brain with no acute CVA,  2-D echo with normal lvef, grade ii diastolic dysfunction, prelim carotid Dopplers unremarkable, final report pending, LDL is 51, A1c is 7.5, he is on aspirin at home, changed to Plavix during hospital stay, neurology recommended to discharge patient on plavix, neurology input appreciated  Parotitis possibly from dehydration , also has history of XRT therapy for thyroid cancer - she received hydration, lasix held in the hospital, Encouraged to keep well-hydrated,  encouraged to use warm compresses, head elevation,  giving her allergies to sulfa and cephalosporins, she is treated  with clindamycin in the hospital. Patient reported has been on augmentin several times before for sinus infection, she does not have experience any side effect from it. She agreed to be discharged on augmentin.  Chest pain - Atypical, resolved, troponins negative 3,  2-D echo with known diastolic dysfunction, adequate lvef.  Hypertension - Cont home medication  Hypercholesterolemia - Continue with statin  Hypothyroidism/status post subtotal thyroidectomy - Continue with Synthroid  noninsulin dependent Diabetes mellitus - Continue with the metformin and insulin sliding scale -a1c 7.5  Morbid obesity: Body mass index is 41.78 kg/(m^2).  Life style modification     Code Status : full  Family Communication : Husband at bedside  Disposition Plan : home  Consults : neurology  Procedures : none   Discharge  Exam: BP 124/69 mmHg  Pulse 67  Temp(Src) 98.3 F (36.8 C) (Oral)  Resp 20  Ht 5\' 6"  (1.676 m)  Wt 117.346 kg (258 lb 11.2 oz)  BMI 41.78 kg/m2  SpO2 98%  General: obese, NAD, very pleasant Cardiovascular: RRR Respiratory: CTABL Extremities; no edema Neuro: Right nasolabial fold mild flattening  likely due to left nasolabial fold increased due to left cheek swelling. Left parotid and left neck mild tender  Discharge Instructions You were cared for by a hospitalist during your hospital stay. If you have any questions about your discharge medications or the care you received while you were in the hospital after you are discharged, you can call the unit and asked to speak with the hospitalist on call if the hospitalist that took care of you is not available. Once you are discharged, your primary care physician will handle any further medical issues. Please note that NO REFILLS for any discharge medications will be authorized once you are discharged, as it is imperative that you return to your primary care physician (or establish a relationship with a primary care physician if you do not have one) for your aftercare needs so that they can reassess your need for medications and monitor your lab values.     Medication List    STOP taking these medications        aspirin EC 325 MG tablet      TAKE these medications        acetaminophen 650 MG CR tablet  Commonly known as:  TYLENOL  Take 650 mg by mouth every 8 (eight) hours as needed for pain.     albuterol 108 (90 Base) MCG/ACT inhaler  Commonly known as:  PROVENTIL HFA;VENTOLIN HFA  Inhale 2 puffs into the lungs every 4 (four) hours as needed for wheezing or shortness of breath.     amoxicillin-clavulanate 875-125 MG tablet  Commonly known as:  AUGMENTIN  Take 1 tablet by mouth 2 (two) times daily.     atenolol 50 MG tablet  Commonly known as:  TENORMIN  Take 50 mg by mouth 2 (two) times daily.     calcium-vitamin D 500-200 MG-UNIT tablet  Commonly known as:  OSCAL WITH D  Take 1 tablet by mouth 3 (three) times daily.     clopidogrel 75 MG tablet  Commonly known as:  PLAVIX  Take 1 tablet (75 mg total) by mouth daily.     Diclofenac Sodium 3 % Gel  Place 1 application onto the skin daily as needed (ankle pain).      furosemide 40 MG tablet  Commonly known as:  LASIX  Please continue to hold today and tomorrow, resume back on Monday     lansoprazole 15 MG capsule  Commonly known as:  PREVACID  Take 1 capsule (15 mg total) by mouth daily at 12 noon.     levothyroxine 300 MCG tablet  Commonly known as:  SYNTHROID, LEVOTHROID  Take 300 mcg by mouth daily before breakfast.     lisinopril 10 MG tablet  Commonly known as:  PRINIVIL,ZESTRIL  Take 10 mg by mouth daily.     metFORMIN 1000 MG tablet  Commonly known as:  GLUCOPHAGE  Take 1 tablet (1,000 mg total) by mouth 2 (two) times daily with a meal.     potassium chloride SA 20 MEQ tablet  Commonly known as:  K-DUR,KLOR-CON  Take 40 mEq by mouth 2 (two) times daily.     pravastatin 20 MG tablet  Commonly known as:  PRAVACHOL  Take 20 mg by mouth daily.     PROBIOTIC PO  Take 1 tablet by mouth daily.       Allergies  Allergen Reactions  . Lorabid [Loracarbef] Anaphylaxis  . Codeine Nausea And Vomiting  . Sulfa Drugs Cross Reactors Other (See Comments)    Severe allergic reaction as a child - was not told symptoms  . Benicar [Olmesartan Medoxomil] Swelling and Rash  . Other Rash    Green peas       Follow-up Information    Follow up with REESE,BETTI D, MD In 1 week.   Specialty:  Family Medicine   Why:  hospital discharge follow up   Contact information:   [place] [address] [phone]        The results of significant diagnostics from this hospitalization (including imaging, microbiology, ancillary and laboratory) are listed below for reference.    Significant Diagnostic Studies: Dg Chest 2 View  09/13/2015  CLINICAL DATA:  Chest pain and tightness. EXAM: CHEST  2 VIEW COMPARISON:  09/18/2014 FINDINGS: The heart size and mediastinal contours are within normal limits. Both lungs are clear. The visualized skeletal structures are unremarkable. IMPRESSION: No active cardiopulmonary disease.  Electronically Signed   By: Fidela Salisbury M.D.   On: 09/13/2015 17:44   Ct Head Wo Contrast  09/13/2015  CLINICAL DATA:  Initial evaluation for acute slurred speech. EXAM: CT HEAD WITHOUT CONTRAST TECHNIQUE: Contiguous axial images were obtained from the base of the skull through the vertex without intravenous contrast. COMPARISON:  Prior CT from 04/30/2007. FINDINGS: Mild age-related cerebral atrophy with chronic small vessel ischemic disease. Scattered vascular calcifications within the carotid siphons. No acute large vessel territory infarct. No acute intracranial hemorrhage. No mass lesion, midline shift, or mass effect. No hydrocephalus. No extra-axial fluid collection. Scalp soft tissues demonstrate no acute abnormality. No acute abnormality about the globes and orbits. Paranasal sinuses are clear.  No mastoid effusion. Calvarium intact. IMPRESSION: 1. No acute intracranial process. 2. Mild age-related cerebral atrophy with chronic small vessel ischemic disease. Electronically Signed   By: Jeannine Boga M.D.   On: 09/13/2015 21:26   Ct Soft Tissue Neck W Contrast  09/13/2015  CLINICAL DATA:  LEFT neck swelling. LEFT neck pain. BILATERAL jaw pain onset today, intermittently worse on the LEFT. EXAM: CT NECK WITH CONTRAST TECHNIQUE: Multidetector CT imaging of the neck was performed using the standard protocol following the bolus administration of intravenous contrast. CONTRAST:  11mL ISOVUE-300 IOPAMIDOL (ISOVUE-300) INJECTION 61% COMPARISON:  None. FINDINGS: Pharynx and larynx: Airway midline. No tonsillar inflammation. Nasopharyngeal adenoidal hypertrophy without mastoid effusion. Parapharyngeal fat preserved. Salivary glands: The LEFT parotid appears slightly larger than the RIGHT, and may enhance slightly in its tail segment. Correlate clinically for unilateral parotitis. No calculi. Normal submandibular glands. Thyroid: Surgically absent. Lymph nodes: No concerning lymphadenopathy to  suggest recurrent thyroid cancer. Vascular: Mild atheromatous change. Limited intracranial: Negative. Visualized orbits: Negative Mastoids and visualized paranasal sinuses: Chronic opacity in the inferomedial LEFT maxillary sinus. Chronic periapical lucencies surround the LEFT maxillary molars. TMJs are located. LEFT paramedian maxillary medial incisor periapical lucency of uncertain acuity, resulting in cortical erosion. See image 23 series 7. Skeleton: Cervical spondylosis. No evidence for periodontal disease in the remaining mandibular teeth. No intrinsic mandibular abnormality. Upper chest: No masses. IMPRESSION: LEFT parotid appears slightly larger than the RIGHT, correlate clinically for mild inflammatory change in the LEFT parotid tail. LEFT paramedian medial  incisor periapical lucency. Uncertain significance with regard and jaw pain. No mandibular periodontal disease or other significant abnormality. Status post thyroidectomy.  No concerning lymphadenopathy. Electronically Signed   By: Staci Righter M.D.   On: 09/13/2015 21:38   Mr Brain Wo Contrast  09/14/2015  CLINICAL DATA:  Initial evaluation for slurred speech with right facial droop. EXAM: MRI HEAD WITHOUT CONTRAST MRA HEAD WITHOUT CONTRAST TECHNIQUE: Multiplanar, multiecho pulse sequences of the brain and surrounding structures were obtained without intravenous contrast. Angiographic images of the head were obtained using MRA technique without contrast. COMPARISON:  Prior CT from 09/13/2015. FINDINGS: MRI HEAD FINDINGS Age-related cerebral volume loss present. Few scattered patchy T2/FLAIR hyperintense foci noted within the white matter both cerebral hemispheres, nonspecific, but most likely related to chronic small vessel ischemic disease. No abnormal foci of restricted diffusion to suggest acute infarct. Gray-white matter differentiation maintained. Major intracranial vascular flow voids are preserved. No acute or chronic intracranial hemorrhage.  No areas of chronic infarction. No mass lesion, midline shift, or mass effect. No hydrocephalus. No extra-axial fluid collection. Major dural sinuses are grossly patent. Craniocervical junction normal. Mild degenerative spondylolysis noted at the C4-5 level without significant stenosis. Remainder the visualized upper cervical spine unremarkable. Pituitary gland demonstrates no acute abnormality. 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 This is of doubtful significance. No acute abnormality about the globes and orbits. Paranasal sinuses are clear. Trace mucosal thickening within the right mastoid air cells. Mastoid air cells are otherwise clear. Inner ear structures grossly normal. Bone marrow signal intensity within normal limits. No scalp soft tissue abnormality. MRA HEAD FINDINGS ANTERIOR CIRCULATION: Distal cervical segments of the internal carotid arteries are patent with antegrade flow. Petrous, cavernous, and supraclinoid segments of the internal carotid arteries are patent with antegrade flow. A1 segments, anterior communicating artery, and anterior cerebral arteries well opacified. M1 segments patent without stenosis or occlusion. MCA bifurcations within normal limits. Distal MCA branches well opacified and symmetric. POSTERIOR CIRCULATION: Vertebral arteries patent to the vertebrobasilar junction. Posterior inferior cerebral arteries patent bilaterally. Basilar artery widely patent. Superior cerebellar and posterior cerebral arteries patent bilaterally. Small bilateral posterior communicating arteries noted. No aneurysm or vascular malformation. IMPRESSION: MRI HEAD IMPRESSION: 1. No acute intracranial infarct or other process identified. 2. Age appropriate cerebral atrophy with mild chronic small vessel ischemic disease. MRA HEAD IMPRESSION: Normal intracranial MRA. Electronically Signed    By: Jeannine Boga M.D.   On: 09/14/2015 03:51   Dg Abd 2 Views  08/21/2015  CLINICAL DATA:  Abdominal pain, nausea, vomiting and diarrhea starting at 4 a.m. History of diabetes. History of thyroid cancer status post resection. EXAM: ABDOMEN - 2 VIEW COMPARISON:  None. FINDINGS: Bowel gas pattern is nonobstructive. No evidence of soft tissue mass or abnormal fluid collection. No evidence of free intraperitoneal air. Cholecystectomy clips in the right upper quadrant. Mild degenerative change in the lower lumbar spine. No acute- appearing osseous abnormality. Lung bases are grossly clear. IMPRESSION: Nonobstructive bowel gas pattern and no evidence of acute intra-abdominal abnormality. Electronically Signed   By: Franki Cabot M.D.   On: 08/21/2015 21:34   Mr Jodene Nam Head/brain Wo Cm  09/14/2015  CLINICAL DATA:  Initial evaluation for slurred speech with right facial droop. EXAM: MRI HEAD WITHOUT CONTRAST MRA HEAD WITHOUT CONTRAST TECHNIQUE: Multiplanar, multiecho pulse sequences of the brain and surrounding structures were obtained without intravenous contrast. Angiographic images  of the head were obtained using MRA technique without contrast. COMPARISON:  Prior CT from 09/13/2015. FINDINGS: MRI HEAD FINDINGS Age-related cerebral volume loss present. Few scattered patchy T2/FLAIR hyperintense foci noted within the white matter both cerebral hemispheres, nonspecific, but most likely related to chronic small vessel ischemic disease. No abnormal foci of restricted diffusion to suggest acute infarct. Gray-white matter differentiation maintained. Major intracranial vascular flow voids are preserved. No acute or chronic intracranial hemorrhage. No areas of chronic infarction. No mass lesion, midline shift, or mass effect. No hydrocephalus. No extra-axial fluid collection. Major dural sinuses are grossly patent. Craniocervical junction normal. Mild degenerative spondylolysis noted at the C4-5 level without  significant stenosis. Remainder the visualized upper cervical spine unremarkable. Pituitary gland demonstrates no acute abnormality. 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 This is of doubtful significance. No acute abnormality about the globes and orbits. Paranasal sinuses are clear. Trace mucosal thickening within the right mastoid air cells. Mastoid air cells are otherwise clear. Inner ear structures grossly normal. Bone marrow signal intensity within normal limits. No scalp soft tissue abnormality. MRA HEAD FINDINGS ANTERIOR CIRCULATION: Distal cervical segments of the internal carotid arteries are patent with antegrade flow. Petrous, cavernous, and supraclinoid segments of the internal carotid arteries are patent with antegrade flow. A1 segments, anterior communicating artery, and anterior cerebral arteries well opacified. M1 segments patent without stenosis or occlusion. MCA bifurcations within normal limits. Distal MCA branches well opacified and symmetric. POSTERIOR CIRCULATION: Vertebral arteries patent to the vertebrobasilar junction. Posterior inferior cerebral arteries patent bilaterally. Basilar artery widely patent. Superior cerebellar and posterior cerebral arteries patent bilaterally. Small bilateral posterior communicating arteries noted. No aneurysm or vascular malformation. IMPRESSION: MRI HEAD IMPRESSION: 1. No acute intracranial infarct or other process identified. 2. Age appropriate cerebral atrophy with mild chronic small vessel ischemic disease. MRA HEAD IMPRESSION: Normal intracranial MRA. Electronically Signed   By: Jeannine Boga M.D.   On: 09/14/2015 03:51    Microbiology: No results found for this or any previous visit (from the past 240 hour(s)).   Labs: Basic Metabolic Panel:  Recent Labs Lab 09/13/15 1734  NA 140  K 3.7  CL 106  CO2 25  GLUCOSE  203*  BUN 9  CREATININE 0.93  CALCIUM 9.0   Liver Function Tests: No results for input(s): AST, ALT, ALKPHOS, BILITOT, PROT, ALBUMIN in the last 168 hours. No results for input(s): LIPASE, AMYLASE in the last 168 hours. No results for input(s): AMMONIA in the last 168 hours. CBC:  Recent Labs Lab 09/13/15 1734  WBC 6.4  HGB 12.4  HCT 39.1  MCV 87.3  PLT 283   Cardiac Enzymes:  Recent Labs Lab 09/14/15 0039 09/14/15 0433 09/14/15 1003  TROPONINI <0.03 <0.03 <0.03   BNP: BNP (last 3 results) No results for input(s): BNP in the last 8760 hours.  ProBNP (last 3 results) No results for input(s): PROBNP in the last 8760 hours.  CBG:  Recent Labs Lab 09/14/15 1750 09/14/15 2109 09/15/15 0713  GLUCAP 229* 155* 176*       Signed:  Derric Dealmeida MD, PhD  [facility] 09/15/2015, 11:36 AM

## 2015-09-15 NOTE — Care Management Note (Signed)
Case Management Note  Patient Details  Name: Ciclaly Capati MRN: [id number] Date of Birth: [birth year]  Subjective/Objective:   Pt in with slurred speech. MRI negative. Pt found to have parotitis. She is from home with her spouse.                  Action/Plan: No f/u needs per PT. CM following for further d/c needs.   Expected Discharge Date:                  Expected Discharge Plan:  Home/Self Care  In-House Referral:     Discharge planning Services  CM Consult  Post Acute Care Choice:    Choice offered to:     DME Arranged:    DME Agency:     HH Arranged:    [place] Agency:     Status of Service:  Completed, signed off  Medicare Important Message Given:    Date Medicare IM Given:    Medicare IM give by:    Date Additional Medicare IM Given:    Additional Medicare Important Message give by:     If discussed at [place] of Stay Meetings, dates discussed:    Additional Comments:  Pollie Friar, RN 09/15/2015, 3:26 PM

## 2015-09-16 ENCOUNTER — Emergency Department (HOSPITAL_COMMUNITY)
Admission: EM | Admit: 2015-09-16 | Discharge: 2015-09-16 | Disposition: A | Discharge status: Home / Self Care | Payer: BC Managed Care – PPO | Attending: Emergency Medicine | Admitting: Emergency Medicine

## 2015-09-16 ENCOUNTER — Other Ambulatory Visit: Payer: Self-pay

## 2015-09-16 ENCOUNTER — Encounter (HOSPITAL_COMMUNITY): Payer: Self-pay | Admitting: Emergency Medicine

## 2015-09-16 DIAGNOSIS — Z955 Presence of coronary angioplasty implant and graft: Secondary | ICD-10-CM | POA: Diagnosis not present

## 2015-09-16 DIAGNOSIS — Z7901 Long term (current) use of anticoagulants: Secondary | ICD-10-CM | POA: Diagnosis not present

## 2015-09-16 DIAGNOSIS — Z7984 Long term (current) use of oral hypoglycemic drugs: Secondary | ICD-10-CM | POA: Insufficient documentation

## 2015-09-16 DIAGNOSIS — T7840XA Allergy, unspecified, initial encounter: Secondary | ICD-10-CM | POA: Diagnosis present

## 2015-09-16 DIAGNOSIS — J45909 Unspecified asthma, uncomplicated: Secondary | ICD-10-CM | POA: Diagnosis not present

## 2015-09-16 DIAGNOSIS — I10 Essential (primary) hypertension: Secondary | ICD-10-CM | POA: Diagnosis not present

## 2015-09-16 DIAGNOSIS — Z79899 Other long term (current) drug therapy: Secondary | ICD-10-CM | POA: Diagnosis not present

## 2015-09-16 DIAGNOSIS — E119 Type 2 diabetes mellitus without complications: Secondary | ICD-10-CM | POA: Diagnosis not present

## 2015-09-16 DIAGNOSIS — R21 Rash and other nonspecific skin eruption: Secondary | ICD-10-CM | POA: Insufficient documentation

## 2015-09-16 DIAGNOSIS — Z859 Personal history of malignant neoplasm, unspecified: Secondary | ICD-10-CM | POA: Diagnosis not present

## 2015-09-16 MED ORDER — ONDANSETRON 4 MG PO TBDP
4.0000 mg | ORAL_TABLET | Freq: Once | ORAL | Status: AC
  Administered 2015-09-16: 4 mg via ORAL
  Filled 2015-09-16: qty 1

## 2015-09-16 MED ORDER — CLINDAMYCIN HCL 300 MG PO CAPS: 300.0000 mg | ORAL_CAPSULE | Freq: Four times a day (QID) | ORAL | Status: DC

## 2015-09-16 MED ORDER — METHYLPREDNISOLONE SODIUM SUCC 125 MG IJ SOLR
125.0000 mg | Freq: Once | INTRAMUSCULAR | Status: AC
  Administered 2015-09-16: 125 mg via INTRAMUSCULAR
  Filled 2015-09-16: qty 2

## 2015-09-16 MED ORDER — FAMOTIDINE 20 MG PO TABS
20.0000 mg | ORAL_TABLET | Freq: Once | ORAL | Status: AC
  Administered 2015-09-16: 20 mg via ORAL
  Filled 2015-09-16: qty 1

## 2015-09-16 MED ORDER — PREDNISONE 20 MG PO TABS: ORAL_TABLET | ORAL | Status: DC

## 2015-09-16 NOTE — Discharge Instructions (Signed)
Stop taking Augmentin. Take steroids as prescribed. Call and make appointment to follow-up with your primary physician. Return immediately for any facial swelling, difficulty breathing, voice changes or for any concerns. Allergies An allergy is an abnormal reaction to a substance by the body's defense system (immune system). Allergies can develop at any age. WHAT CAUSES ALLERGIES? An allergic reaction happens when the immune system mistakenly reacts to a normally harmless substance, called an allergen, as if it were harmful. The immune system releases antibodies to fight the substance. Antibodies eventually release a chemical called histamine into the bloodstream. The release of histamine is meant to protect the body from infection, but it also causes discomfort. An allergic reaction can be triggered by:  Eating an allergen.  Inhaling an allergen.  Touching an allergen. WHAT TYPES OF ALLERGIES ARE THERE? There are many types of allergies. Common types include:  Seasonal allergies. People with this type of allergy are usually allergic to substances that are only present during certain seasons, such as molds and pollens.  Food allergies.  Drug allergies.  Insect allergies.  Animal dander allergies. WHAT ARE SYMPTOMS OF ALLERGIES? Possible allergy symptoms include:  Swelling of the lips, face, tongue, mouth, or throat.  Sneezing, coughing, or wheezing.  Nasal congestion.  Tingling in the mouth.  Rash.  Itching.  Itchy, red, swollen areas of skin (hives).  Watery eyes.  Vomiting.  Diarrhea.  Dizziness.  Lightheadedness.  Fainting.  Trouble breathing or swallowing.  Chest tightness.  Rapid heartbeat. HOW ARE ALLERGIES DIAGNOSED? Allergies are diagnosed with a medical and family history and one or more of the following:  Skin tests.  Blood tests.  A food diary. A food diary is a record of all the foods and drinks you have in a day and of all the symptoms you  experience.  The results of an elimination diet. An elimination diet involves eliminating foods from your diet and then adding them back in one by one to find out if a certain food causes an allergic reaction. HOW ARE ALLERGIES TREATED? There is no cure for allergies, but allergic reactions can be treated with medicine. Severe reactions usually need to be treated at a hospital. HOW CAN REACTIONS BE PREVENTED? The best way to prevent an allergic reaction is by avoiding the substance you are allergic to. Allergy shots and medicines can also help prevent reactions in some cases. People with severe allergic reactions may be able to prevent a life-threatening reaction called anaphylaxis with a medicine given right after exposure to the allergen.   This information is not intended to replace advice given to you by your health care provider. Make sure you discuss any questions you have with your health care provider.   Document Released: 06/08/2002 Document Revised: 04/05/2014 Document Reviewed: 12/25/2013 Elsevier Interactive Patient Education [employer].

## 2015-09-16 NOTE — ED Notes (Signed)
Pt in from home via [facility] with c/o allergic rxn and hives after Amoxicillin. Pt was recently admitted for TIA, discharged yesterday and sent home on Amox for L neck gland infection. Pt took first dose today around 12-1013, ears/mouth began to itch and pt took 50mg  of Benadryl. When [facility] arrived, hives started, and another 25mg  Benadryl was given. Pt arrived to ED a&o, denies sob.

## 2015-09-16 NOTE — ED Provider Notes (Signed)
CSN: [id number]     Arrival date & time 09/16/15  1137 History   First MD Initiated Contact with Patient 09/16/15 1203     Chief Complaint  Patient presents with  . Allergic Reaction     (Consider location/radiation/quality/duration/timing/severity/associated sxs/prior Treatment) HPI Patient was discharged yesterday from [facility] after receiving diagnosis of mild parotiditis on the left. Was given a dose of clindamycin in the hospital but discharged home with Augmentin. Patient states she is taking Augmentin before without previous reaction. States she took her first dose this morning. Roughly 5 minutes after taking the day she had itching sensation in her throat and then throughout her body. She then noticed red raised rash to her extremities, face and trunk. She also had nausea with one episode of vomiting. She states she felt lightheaded and that she might pass out. Patient took 50 mg of Benadryl and EMS was called. Patient states that the rash has improved. She had no point had any difficulty breathing. She denies any intraoral swelling. Past Medical History  Diagnosis Date  . Diabetes mellitus   . Hypertension   . Hypercholesteremia   . Heart murmur   . Complication of anesthesia   . PONV (postoperative nausea and vomiting)   . 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 Family history of adverse reaction to anesthesia     difficult for father to wake after surgery  . Dysrhythmia     " irregular heart rate per patient"  . Asthma     due to allergies   . Anemia     hx of   . Thyroid disease   . Cancer [facility])    Past Surgical History  Procedure Laterality Date  . Colon surgery    . Abdominal hysterectomy    . Tonsillectomy    . Knee arthroscopy    . Cholecystectomy    . Left heart catheterization with coronary angiogram N/A 04/24/2014    Procedure: LEFT HEART CATHETERIZATION WITH CORONARY ANGIOGRAM;  Surgeon: Burnell Blanks, MD;  Location: [facility] CATH LAB;  Service:  Cardiovascular;  Laterality: N/A;  . Thyroidectomy N/A 09/20/2014    Procedure: TOTAL THYROIDECTOMY;  Surgeon: Armandina Gemma, MD;  Location: [facility] ORS;  Service: General;  Laterality: N/A;   Family History  Problem Relation Age of Onset  . Heart failure Mother   . Stroke Father   . Heart failure Father   . Diabetes Mother   . Cancer Father    Social History  Substance Use Topics  . Smoking status: Never Smoker   . Smokeless tobacco: Never Used  . Alcohol Use: No   OB History    No data available     Review of Systems  Constitutional: Negative for fever and chills.  HENT: Negative for facial swelling, trouble swallowing and voice change.   Eyes: Negative for visual disturbance.  Respiratory: Negative for shortness of breath, wheezing and stridor.   Cardiovascular: Negative for chest pain.  Gastrointestinal: Positive for nausea and vomiting. Negative for abdominal pain and constipation.  Musculoskeletal: Negative for myalgias, back pain and arthralgias.  Skin: Positive for rash.  Neurological: Positive for dizziness and light-headedness. Negative for weakness, numbness and headaches.  All other systems reviewed and are negative.     Allergies  Lorabid; Augmentin; Codeine; Sulfa drugs cross reactors; Benicar; and Other  Home Medications   Prior to Admission medications   Medication Sig Start Date End Date Taking? Authorizing Provider  acetaminophen (TYLENOL)  650 MG CR tablet Take 650 mg by mouth every 8 (eight) hours as needed for pain.    Yes Historical Provider, MD  atenolol (TENORMIN) 50 MG tablet Take 50 mg by mouth 2 (two) times daily.   Yes Historical Provider, MD  calcium-vitamin D (OSCAL WITH D) 500-200 MG-UNIT per tablet Take 1 tablet by mouth 3 (three) times daily. Patient taking differently: Take 2 tablets by mouth 3 (three) times daily.  09/20/14  Yes Armandina Gemma, MD  clopidogrel (PLAVIX) 75 MG tablet Take 1 tablet (75 mg total) by mouth daily. 09/15/15  Yes Florencia Reasons,  MD  furosemide (LASIX) 40 MG tablet Please continue to hold today and tomorrow, resume back on Monday Patient taking differently: Take 80 mg by mouth 2 (two) times daily.  08/23/15  Yes Albertine Patricia, MD  lansoprazole (PREVACID) 15 MG capsule Take 1 capsule (15 mg total) by mouth daily at 12 noon. 03/07/15  Yes Sharlett Iles, MD  levothyroxine (SYNTHROID, LEVOTHROID) 300 MCG tablet Take 300 mcg by mouth daily before breakfast.   Yes Historical Provider, MD  lisinopril (PRINIVIL,ZESTRIL) 10 MG tablet Take 10 mg by mouth daily.  09/09/14  Yes Historical Provider, MD  metFORMIN (GLUCOPHAGE) 1000 MG tablet Take 1 tablet (1,000 mg total) by mouth 2 (two) times daily with a meal. 04/29/14  Yes Maryann Mikhail, DO  potassium chloride SA (K-DUR,KLOR-CON) 20 MEQ tablet Take 40 mEq by mouth 2 (two) times daily.    Yes Historical Provider, MD  pravastatin (PRAVACHOL) 20 MG tablet Take 20 mg by mouth daily.  06/25/13  Yes Historical Provider, MD  Probiotic Product (PROBIOTIC PO) Take 1 tablet by mouth daily.   Yes Historical Provider, MD  albuterol (PROVENTIL HFA;VENTOLIN HFA) 108 (90 BASE) MCG/ACT inhaler Inhale 2 puffs into the lungs every 4 (four) hours as needed for wheezing or shortness of breath. 09/07/13   Leeanne Rio, MD  clindamycin (CLEOCIN) 300 MG capsule Take 1 capsule (300 mg total) by mouth 4 (four) times daily. X 7 days 09/16/15   Julianne Rice, MD  Diclofenac Sodium 3 % GEL Place 1 application onto the skin daily as needed (ankle pain).    Historical Provider, MD  predniSONE (DELTASONE) 20 MG tablet 3 tabs po day one, then 2 tabs daily x 4 days 09/16/15   Julianne Rice, MD   BP 119/57 mmHg  Pulse 70  Temp(Src) 97.8 F (36.6 C) (Oral)  Resp 14  Ht 5\' 6"  (1.676 m)  Wt 252 lb (114.306 kg)  BMI 40.69 kg/m2  SpO2 100% Physical Exam  Constitutional: She is oriented to person, place, and time. She appears well-developed and well-nourished. No distress.  HENT:  Head:  Normocephalic and atraumatic.  Mouth/Throat: Oropharynx is clear and moist. No oropharyngeal exudate.  No facial or intraoral swelling  Eyes: EOM are normal. Pupils are equal, round, and reactive to light.  Neck: Normal range of motion. Neck supple.  Cardiovascular: Normal rate and regular rhythm.  Exam reveals no gallop and no friction rub.   No murmur heard. Pulmonary/Chest: Effort normal and breath sounds normal. No stridor. No respiratory distress. She has no wheezes. She has no rales. She exhibits no tenderness.  Abdominal: Soft. Bowel sounds are normal. She exhibits no distension and no mass. There is no tenderness. There is no rebound and no guarding.  Musculoskeletal: Normal range of motion. She exhibits no edema or tenderness.  Neurological: She is alert and oriented to person, place, and time.  5/5  motor in all extremities. Sensation is fully intact.  Skin: Skin is warm and dry. Rash noted. No erythema.  Raised erythematous papules and plaques to the face, trunk and bilateral extremities.  Psychiatric: She has a normal mood and affect. Her behavior is normal.  Nursing note and vitals reviewed.   ED Course  Procedures (including critical care time) Labs Review Labs Reviewed - No data to display  Imaging Review No results found. I have personally reviewed and evaluated these images and lab results as part of my medical decision-making.   EKG Interpretation None      MDM   Final diagnoses:  Allergic reaction caused by a drug    Rash is significantly improved after Solu-Medrol and Pepcid. Continues to protect airway. We will continue observation  Patient observed 3 hours in the emergency department. Continues to have significant improvement. I would advise stopping Augmentin will start on clindamycin. We'll give short course of steroids. Return precautions has been given.  Julianne Rice, MD 09/16/15 [phone]

## 2015-10-11 ENCOUNTER — Encounter (HOSPITAL_COMMUNITY): Payer: Self-pay

## 2015-10-11 ENCOUNTER — Emergency Department (HOSPITAL_COMMUNITY)
Admission: EM | Admit: 2015-10-11 | Discharge: 2015-10-11 | Disposition: A | Discharge status: Home / Self Care | Payer: BC Managed Care – PPO | Attending: Emergency Medicine | Admitting: Emergency Medicine

## 2015-10-11 DIAGNOSIS — J45909 Unspecified asthma, uncomplicated: Secondary | ICD-10-CM | POA: Insufficient documentation

## 2015-10-11 DIAGNOSIS — E119 Type 2 diabetes mellitus without complications: Secondary | ICD-10-CM | POA: Diagnosis not present

## 2015-10-11 DIAGNOSIS — Z7984 Long term (current) use of oral hypoglycemic drugs: Secondary | ICD-10-CM | POA: Insufficient documentation

## 2015-10-11 DIAGNOSIS — E039 Hypothyroidism, unspecified: Secondary | ICD-10-CM | POA: Insufficient documentation

## 2015-10-11 DIAGNOSIS — I10 Essential (primary) hypertension: Secondary | ICD-10-CM | POA: Diagnosis not present

## 2015-10-11 DIAGNOSIS — Z85850 Personal history of malignant neoplasm of thyroid: Secondary | ICD-10-CM | POA: Diagnosis not present

## 2015-10-11 DIAGNOSIS — R112 Nausea with vomiting, unspecified: Secondary | ICD-10-CM | POA: Diagnosis present

## 2015-10-11 DIAGNOSIS — R197 Diarrhea, unspecified: Secondary | ICD-10-CM | POA: Insufficient documentation

## 2015-10-11 DIAGNOSIS — Z7902 Long term (current) use of antithrombotics/antiplatelets: Secondary | ICD-10-CM | POA: Diagnosis not present

## 2015-10-11 LAB — COMPREHENSIVE METABOLIC PANEL
ALT: 23 U/L (ref 14–54)
AST: 23 U/L (ref 15–41)
Albumin: 3.7 g/dL (ref 3.5–5.0)
Alkaline Phosphatase: 62 U/L (ref 38–126)
Anion gap: 11 (ref 5–15)
BUN: 13 mg/dL (ref 6–20)
CO2: 24 mmol/L (ref 22–32)
Calcium: 9.1 mg/dL (ref 8.9–10.3)
Chloride: 104 mmol/L (ref 101–111)
Creatinine, Ser: 0.96 mg/dL (ref 0.44–1.00)
GFR calc Af Amer: >60 mL/min (ref >60)
GFR calc non Af Amer: >60 mL/min (ref >60)
Glucose, Bld: 231 mg/dL — ABNORMAL HIGH (ref 65–99)
Potassium: 3.8 mmol/L (ref 3.5–5.1)
Sodium: 139 mmol/L (ref 135–145)
Total Bilirubin: 0.9 mg/dL (ref 0.3–1.2)
Total Protein: 7.3 g/dL (ref 6.5–8.1)

## 2015-10-11 LAB — URINALYSIS, ROUTINE W REFLEX MICROSCOPIC
Bilirubin Urine: NEGATIVE
Glucose, UA: NEGATIVE mg/dL
Hgb urine dipstick: NEGATIVE
Ketones, ur: NEGATIVE mg/dL
Nitrite: NEGATIVE
Protein, ur: 30 mg/dL — AB
Specific Gravity, Urine: 1.016 (ref 1.005–1.030)
pH: 6 (ref 5.0–8.0)

## 2015-10-11 LAB — CBC
HCT: 41.7 % (ref 36.0–46.0)
Hemoglobin: 13.8 g/dL (ref 12.0–15.0)
MCH: 28.7 pg (ref 26.0–34.0)
MCHC: 33.1 g/dL (ref 30.0–36.0)
MCV: 86.7 fL (ref 78.0–100.0)
Platelets: 267 10*3/uL (ref 150–400)
RBC: 4.81 MIL/uL (ref 3.87–5.11)
RDW: 14.3 % (ref 11.5–15.5)
WBC: 8.4 10*3/uL (ref 4.0–10.5)

## 2015-10-11 LAB — URINE MICROSCOPIC-ADD ON: RBC / HPF: NONE SEEN RBC/hpf (ref 0–5)

## 2015-10-11 LAB — SEDIMENTATION RATE: Sed Rate: 27 mm/hr — ABNORMAL HIGH (ref 0–22)

## 2015-10-11 LAB — LIPASE, BLOOD: Lipase: 35 U/L (ref 11–51)

## 2015-10-11 MED ORDER — PROMETHAZINE HCL 25 MG PO TABS: 25.0000 mg | ORAL_TABLET | Freq: Four times a day (QID) | ORAL | Status: DC | PRN

## 2015-10-11 MED ORDER — SODIUM CHLORIDE 0.9 % IV BOLUS (SEPSIS)
1000.0000 mL | Freq: Once | INTRAVENOUS | Status: AC
  Administered 2015-10-11: 1000 mL via INTRAVENOUS

## 2015-10-11 MED ORDER — ONDANSETRON HCL 4 MG/2ML IJ SOLN
4.0000 mg | Freq: Once | INTRAMUSCULAR | Status: AC
  Administered 2015-10-11: 4 mg via INTRAVENOUS
  Filled 2015-10-11: qty 2

## 2015-10-11 MED ORDER — KETOROLAC TROMETHAMINE 15 MG/ML IJ SOLN
15.0000 mg | Freq: Once | INTRAMUSCULAR | Status: AC
  Administered 2015-10-11: 15 mg via INTRAVENOUS
  Filled 2015-10-11: qty 1

## 2015-10-11 NOTE — ED Notes (Signed)
Patient here with weeks of intermittent vomiting and diarrhea, denies abdominal pain. States she has ben taking clindamycin for weeks as well due to neck infection. Reports diaphoresis with the vomiting. No distress

## 2015-10-11 NOTE — Discharge Instructions (Signed)
Stop taking antibiotics. Start taking probiotics. You can find them over the counter at any drug store. You can also eat live culture yogurt. This is to replenish the beneficial organisms that are decreased with antibiotic usage.  Diarrhea Diarrhea is frequent loose and watery bowel movements. It can cause you to feel weak and dehydrated. Dehydration can cause you to become tired and thirsty, have a dry mouth, and have decreased urination that often is dark yellow. Diarrhea is a sign of another problem, most often an infection that will not last long. In most cases, diarrhea typically lasts 2-3 days. However, it can last longer if it is a sign of something more serious. It is important to treat your diarrhea as directed by your caregiver to lessen or prevent future episodes of diarrhea. CAUSES  Some common causes include:  Gastrointestinal infections caused by viruses, bacteria, or parasites.  Food poisoning or food allergies.  Certain medicines, such as antibiotics, chemotherapy, and laxatives.  Artificial sweeteners and fructose.  Digestive disorders. HOME CARE INSTRUCTIONS  Ensure adequate fluid intake (hydration): Have 1 cup (8 oz) of fluid for each diarrhea episode. Avoid fluids that contain simple sugars or sports drinks, fruit juices, whole milk products, and sodas. Your urine should be clear or pale yellow if you are drinking enough fluids. Hydrate with an oral rehydration solution that you can purchase at pharmacies, retail stores, and online. You can prepare an oral rehydration solution at home by mixing the following ingredients together:   - tsp table salt.   tsp baking soda.   tsp salt substitute containing potassium chloride.  1  tablespoons sugar.  1 L (34 oz) of water.  Certain foods and beverages may increase the speed at which food moves through the gastrointestinal (GI) tract. These foods and beverages should be avoided and include:  Caffeinated and alcoholic  beverages.  High-fiber foods, such as raw fruits and vegetables, nuts, seeds, and whole grain breads and cereals.  Foods and beverages sweetened with sugar alcohols, such as xylitol, sorbitol, and mannitol.  Some foods may be well tolerated and may help thicken stool including:  Starchy foods, such as rice, toast, pasta, low-sugar cereal, oatmeal, grits, baked potatoes, crackers, and bagels.  Bananas.  Applesauce.  Add probiotic-rich foods to help increase healthy bacteria in the GI tract, such as yogurt and fermented milk products.  Wash your hands well after each diarrhea episode.  Only take over-the-counter or prescription medicines as directed by your caregiver.  Take a warm bath to relieve any burning or pain from frequent diarrhea episodes. SEEK IMMEDIATE MEDICAL CARE IF:   You are unable to keep fluids down.  You have persistent vomiting.  You have blood in your stool, or your stools are black and tarry.  You do not urinate in 6-8 hours, or there is only a small amount of very dark urine.  You have abdominal pain that increases or localizes.  You have weakness, dizziness, confusion, or light-headedness.  You have a severe headache.  Your diarrhea gets worse or does not get better.  You have a fever or persistent symptoms for more than 2-3 days.  You have a fever and your symptoms suddenly get worse. MAKE SURE YOU:   Understand these instructions.  Will watch your condition.  Will get help right away if you are not doing well or get worse.   This information is not intended to replace advice given to you by your health care provider. Make sure you discuss any  questions you have with your health care provider.   Document Released: 03/05/2002 Document Revised: 04/05/2014 Document Reviewed: 11/21/2011 Elsevier Interactive Patient Education [employer].

## 2015-10-27 NOTE — ED Provider Notes (Signed)
[facility] DEPT Provider Note   CSN: [id number] Arrival date & time: 10/11/15  1301  First Provider Contact:  First MD Initiated Contact with Patient 10/11/15 1605        History   Chief Complaint Chief Complaint  Patient presents with  . Emesis  . Weakness  . Diarrhea    HPI [name] is a 55 y.o. female.  HPI   62yF with diarrhea. Weeks of intermittent vomiting and diarrhea, denies abdominal pain. States she has ben taking clindamycin for weeks as well due to neck infection. She reports parotitis. Reports diaphoresis with the vomiting. No blood in stool. No fever. No sick contacts.   Past Medical History:  Diagnosis Date  . Anemia    hx of   . 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 Asthma    due to allergies   . Cancer ([place])   . Complication of anesthesia   . Diabetes mellitus   . Dysrhythmia    " irregular heart rate per patient"  . Family history of adverse reaction to anesthesia    difficult for father to wake after surgery  . Heart murmur   . Hypercholesteremia   . Hypertension   . PONV (postoperative nausea and vomiting)   . Thyroid disease     Patient Active Problem List   Diagnosis Date Noted  . Slurred speech 09/13/2015  . Parotitis 09/13/2015  . Gastroenteritis 08/21/2015  . Hypothyroid 08/21/2015  . 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 Pain in the chest   . Essential hypertension   . Thoracic aortic aneurysm ([place])   . Chest pain 04/23/2014  . Anginal pain ([place]) 04/23/2014  . Type 2 diabetes mellitus ([place])   . Hypertension   . Hypercholesteremia     Past Surgical History:  Procedure Laterality Date  . ABDOMINAL HYSTERECTOMY    . CHOLECYSTECTOMY    . COLON SURGERY    . KNEE ARTHROSCOPY    . LEFT HEART CATHETERIZATION WITH CORONARY ANGIOGRAM N/A 04/24/2014   Procedure: LEFT HEART CATHETERIZATION WITH CORONARY ANGIOGRAM;  Surgeon: Burnell Blanks, MD;  Location: [facility] CATH LAB;  Service: Cardiovascular;   Laterality: N/A;  . THYROIDECTOMY N/A 09/20/2014   Procedure: TOTAL THYROIDECTOMY;  Surgeon: Armandina Gemma, MD;  Location: [facility] ORS;  Service: General;  Laterality: N/A;  . TONSILLECTOMY      OB History    No data available       Home Medications    Prior to Admission medications   Medication Sig Start Date End Date Taking? Authorizing Provider  acetaminophen (TYLENOL) 325 MG tablet Take 650 mg by mouth every 4 (four) hours as needed for mild pain or headache.   Yes Historical Provider, MD  albuterol (PROVENTIL HFA;VENTOLIN HFA) 108 (90 BASE) MCG/ACT inhaler Inhale 2 puffs into the lungs every 4 (four) hours as needed for wheezing or shortness of breath. 09/07/13  Yes Leeanne Rio, MD  atenolol (TENORMIN) 50 MG tablet Take 50 mg by mouth 2 (two) times daily.   Yes Historical Provider, MD  calcium-vitamin D (OSCAL WITH D) 500-200 MG-UNIT per tablet Take 1 tablet by mouth 3 (three) times daily. Patient taking differently: Take 2 tablets by mouth 3 (three) times daily.  09/20/14  Yes Armandina Gemma, MD  clindamycin (CLEOCIN) 150 MG capsule Take 450 mg by mouth 2 (two) times daily. Starting 10/01/15 for 14 days 10/01/15  Yes Historical Provider, MD  clopidogrel (PLAVIX) 75 MG tablet  Take 1 tablet (75 mg total) by mouth daily. 09/15/15  Yes Florencia Reasons, MD  furosemide (LASIX) 40 MG tablet Please continue to hold today and tomorrow, resume back on Monday Patient taking differently: Take 80 mg by mouth 2 (two) times daily.  08/23/15  Yes Albertine Patricia, MD  lansoprazole (PREVACID) 15 MG capsule Take 1 capsule (15 mg total) by mouth daily at 12 noon. 03/07/15  Yes Sharlett Iles, MD  levothyroxine (SYNTHROID, LEVOTHROID) 300 MCG tablet Take 300 mcg by mouth daily before breakfast.   Yes Historical Provider, MD  lisinopril (PRINIVIL,ZESTRIL) 10 MG tablet Take 10 mg by mouth daily.  09/09/14  Yes Historical Provider, MD  metFORMIN (GLUCOPHAGE) 1000 MG tablet Take 1 tablet (1,000 mg total) by mouth 2  (two) times daily with a meal. 04/29/14  Yes Maryann Mikhail, DO  ondansetron (ZOFRAN) 4 MG tablet Take 8 mg by mouth every 8 (eight) hours as needed for nausea or vomiting.   Yes Historical Provider, MD  potassium chloride SA (K-DUR,KLOR-CON) 20 MEQ tablet Take 40 mEq by mouth 2 (two) times daily.    Yes Historical Provider, MD  pravastatin (PRAVACHOL) 20 MG tablet Take 20 mg by mouth daily.  06/25/13  Yes Historical Provider, MD  Probiotic Product (PROBIOTIC PO) Take 1 tablet by mouth daily.   Yes Historical Provider, MD  promethazine (PHENERGAN) 25 MG tablet Take 1 tablet (25 mg total) by mouth every 6 (six) hours as needed for nausea or vomiting. 10/11/15   Virgel Manifold, MD    Family History Family History  Problem Relation Age of Onset  . Heart failure Mother   . Stroke Father   . Heart failure Father   . Diabetes Mother   . Cancer Father     Social History Social History  Substance Use Topics  . Smoking status: Never Smoker  . Smokeless tobacco: Never Used  . Alcohol use No     Allergies   Lorabid [loracarbef]; Augmentin [amoxicillin-pot clavulanate]; Codeine; Sulfa drugs cross reactors; Benicar [olmesartan medoxomil]; and Other   Review of Systems Review of Systems  All systems reviewed and negative, other than as noted in HPI.  Physical Exam Updated Vital Signs BP 114/71   Pulse 69   Temp 98.5 F (36.9 C)   Resp 18   Ht 5\' 6"  (1.676 m)   Wt 250 lb (113.4 kg)   SpO2 97%   BMI 40.35 kg/m   Physical Exam  Constitutional: She appears well-developed and well-nourished. No distress.  HENT:  Head: Normocephalic and atraumatic.  Eyes: Conjunctivae are normal.  Neck: Neck supple.  Cardiovascular: Normal rate and regular rhythm.   No murmur heard. Pulmonary/Chest: Effort normal and breath sounds normal. No respiratory distress.  Abdominal: Soft. There is no tenderness.  Musculoskeletal: She exhibits no edema.  Neurological: She is alert.  Skin: Skin is warm and  dry.  Psychiatric: She has a normal mood and affect.  Nursing note and vitals reviewed.    ED Treatments / Results  Labs (all labs ordered are listed, but only abnormal results are displayed) Labs Reviewed  COMPREHENSIVE METABOLIC PANEL - Abnormal; Notable for the following:       Result Value   Glucose, Bld 231 (*)    All other components within normal limits  URINALYSIS, ROUTINE W REFLEX MICROSCOPIC (NOT AT [facility]) - Abnormal; Notable for the following:    Protein, ur 30 (*)    Leukocytes, UA TRACE (*)    All other components  within normal limits  URINE MICROSCOPIC-ADD ON - Abnormal; Notable for the following:    Squamous Epithelial / LPF 0-5 (*)    Bacteria, UA RARE (*)    All other components within normal limits  SEDIMENTATION RATE - Abnormal; Notable for the following:    Sed Rate 27 (*)    All other components within normal limits  LIPASE, BLOOD  CBC    EKG  EKG Interpretation None       Radiology No results found.  Procedures Procedures (including critical care time)  Medications Ordered in ED Medications  sodium chloride 0.9 % bolus 1,000 mL (0 mLs Intravenous Stopped 10/11/15 2015)  ondansetron (ZOFRAN) injection 4 mg (4 mg Intravenous Given 10/11/15 1659)  ketorolac (TORADOL) 15 MG/ML injection 15 mg (15 mg Intravenous Given 10/11/15 1659)     Initial Impression / Assessment and Plan / ED Course  I have reviewed the triage vital signs and the nursing notes.  Pertinent labs & imaging results that were available during my care of the patient were reviewed by me and considered in my medical decision making (see chart for details).  Clinical Course   54yF with n/v/d. Has been on abx for extended period of time for parotitis including clindamycin. Clinically this has resolved and I do not feel she would be getting much additional benefit from continuing. Diarrhea on other symptoms could be from SE and she may have potentially contracted cdiff. Unable to  provide sample in the ED. Feeling better with intervention in ED. It has been determined that no acute conditions requiring further emergency intervention are present at this time. The patient has been advised of the diagnosis and plan. I reviewed any labs and imaging including any potential incidental findings. We have discussed signs and symptoms that warrant return to the ED and they are listed in the discharge instructions.     Final Clinical Impressions(s) / ED Diagnoses   Final diagnoses:  Non-intractable vomiting with nausea, vomiting of unspecified type  Diarrhea, unspecified type    New Prescriptions Discharge Medication List as of 10/11/2015  8:12 PM    START taking these medications   Details  promethazine (PHENERGAN) 25 MG tablet Take 1 tablet (25 mg total) by mouth every 6 (six) hours as needed for nausea or vomiting., Starting 10/11/2015, Until Discontinued, Print         Virgel Manifold, MD 10/27/15 2121

## 2015-10-29 ENCOUNTER — Other Ambulatory Visit: Payer: Self-pay | Admitting: Family Medicine

## 2015-10-29 DIAGNOSIS — R1084 Generalized abdominal pain: Secondary | ICD-10-CM

## 2015-10-29 DIAGNOSIS — R112 Nausea with vomiting, unspecified: Secondary | ICD-10-CM

## 2015-10-29 DIAGNOSIS — R140 Abdominal distension (gaseous): Secondary | ICD-10-CM

## 2015-10-29 DIAGNOSIS — R110 Nausea: Secondary | ICD-10-CM

## 2015-10-30 ENCOUNTER — Ambulatory Visit
Admission: RE | Admit: 2015-10-30 | Discharge: 2015-10-30 | Disposition: A | Discharge status: Home / Self Care | Payer: BC Managed Care – PPO | Source: Ambulatory Visit | Attending: Family Medicine | Admitting: Family Medicine

## 2015-10-30 DIAGNOSIS — R110 Nausea: Secondary | ICD-10-CM

## 2015-10-30 DIAGNOSIS — R112 Nausea with vomiting, unspecified: Secondary | ICD-10-CM

## 2015-10-30 DIAGNOSIS — R140 Abdominal distension (gaseous): Secondary | ICD-10-CM

## 2015-10-30 DIAGNOSIS — R1084 Generalized abdominal pain: Secondary | ICD-10-CM

## 2015-11-05 ENCOUNTER — Other Ambulatory Visit: Payer: Self-pay | Admitting: Family Medicine

## 2015-11-05 DIAGNOSIS — R1084 Generalized abdominal pain: Secondary | ICD-10-CM

## 2015-11-14 ENCOUNTER — Other Ambulatory Visit: Payer: Self-pay

## 2015-11-20 ENCOUNTER — Other Ambulatory Visit: Payer: Self-pay | Admitting: Family Medicine

## 2015-11-20 DIAGNOSIS — R109 Unspecified abdominal pain: Principal | ICD-10-CM

## 2015-11-20 DIAGNOSIS — G8929 Other chronic pain: Secondary | ICD-10-CM

## 2015-11-28 ENCOUNTER — Ambulatory Visit
Admission: RE | Admit: 2015-11-28 | Discharge: 2015-11-28 | Disposition: A | Discharge status: Home / Self Care | Payer: BC Managed Care – PPO | Source: Ambulatory Visit | Attending: Family Medicine | Admitting: Family Medicine

## 2015-11-28 DIAGNOSIS — G8929 Other chronic pain: Secondary | ICD-10-CM

## 2015-11-28 DIAGNOSIS — R109 Unspecified abdominal pain: Principal | ICD-10-CM

## 2015-11-28 MED ORDER — IOPAMIDOL (ISOVUE-300) INJECTION 61%
125.0000 mL | Freq: Once | INTRAVENOUS | Status: AC | PRN
  Administered 2015-11-28: 125 mL via INTRAVENOUS

## 2015-12-18 ENCOUNTER — Other Ambulatory Visit: Payer: Self-pay | Admitting: Family Medicine

## 2015-12-18 DIAGNOSIS — R935 Abnormal findings on diagnostic imaging of other abdominal regions, including retroperitoneum: Secondary | ICD-10-CM

## 2015-12-29 ENCOUNTER — Ambulatory Visit
Admission: RE | Admit: 2015-12-29 | Discharge: 2015-12-29 | Disposition: A | Discharge status: Home / Self Care | Payer: BC Managed Care – PPO | Source: Ambulatory Visit | Attending: Family Medicine | Admitting: Family Medicine

## 2015-12-29 DIAGNOSIS — R935 Abnormal findings on diagnostic imaging of other abdominal regions, including retroperitoneum: Secondary | ICD-10-CM

## 2015-12-29 MED ORDER — GADOBENATE DIMEGLUMINE 529 MG/ML IV SOLN
20.0000 mL | Freq: Once | INTRAVENOUS | Status: AC | PRN
  Administered 2015-12-29: 20 mL via INTRAVENOUS

## 2016-05-16 ENCOUNTER — Emergency Department (HOSPITAL_COMMUNITY): Payer: BC Managed Care – PPO

## 2016-05-16 ENCOUNTER — Observation Stay (HOSPITAL_COMMUNITY)
Admission: EM | Admit: 2016-05-16 | Discharge: 2016-05-17 | Disposition: A | Discharge status: Home / Self Care | Payer: BC Managed Care – PPO | Attending: Internal Medicine | Admitting: Internal Medicine

## 2016-05-16 ENCOUNTER — Encounter (HOSPITAL_COMMUNITY): Payer: Self-pay | Admitting: *Deleted

## 2016-05-16 DIAGNOSIS — R51 Headache: Secondary | ICD-10-CM | POA: Insufficient documentation

## 2016-05-16 DIAGNOSIS — I10 Essential (primary) hypertension: Secondary | ICD-10-CM | POA: Diagnosis present

## 2016-05-16 DIAGNOSIS — Z882 Allergy status to sulfonamides status: Secondary | ICD-10-CM | POA: Diagnosis not present

## 2016-05-16 DIAGNOSIS — Z923 Personal history of irradiation: Secondary | ICD-10-CM | POA: Insufficient documentation

## 2016-05-16 DIAGNOSIS — I712 Thoracic aortic aneurysm, without rupture, unspecified: Secondary | ICD-10-CM | POA: Diagnosis present

## 2016-05-16 DIAGNOSIS — E7800 Pure hypercholesterolemia, unspecified: Secondary | ICD-10-CM | POA: Diagnosis not present

## 2016-05-16 DIAGNOSIS — D164 Benign neoplasm of bones of skull and face: Secondary | ICD-10-CM | POA: Diagnosis not present

## 2016-05-16 DIAGNOSIS — I2510 Atherosclerotic heart disease of native coronary artery without angina pectoris: Secondary | ICD-10-CM | POA: Diagnosis not present

## 2016-05-16 DIAGNOSIS — Z6837 Body mass index (BMI) 37.0-37.9, adult: Secondary | ICD-10-CM | POA: Insufficient documentation

## 2016-05-16 DIAGNOSIS — R2990 Unspecified symptoms and signs involving the nervous system: Secondary | ICD-10-CM

## 2016-05-16 DIAGNOSIS — Z880 Allergy status to penicillin: Secondary | ICD-10-CM | POA: Diagnosis not present

## 2016-05-16 DIAGNOSIS — Z888 Allergy status to other drugs, medicaments and biological substances status: Secondary | ICD-10-CM | POA: Diagnosis not present

## 2016-05-16 DIAGNOSIS — E669 Obesity, unspecified: Secondary | ICD-10-CM | POA: Diagnosis not present

## 2016-05-16 DIAGNOSIS — Z7984 Long term (current) use of oral hypoglycemic drugs: Secondary | ICD-10-CM | POA: Diagnosis not present

## 2016-05-16 DIAGNOSIS — E119 Type 2 diabetes mellitus without complications: Secondary | ICD-10-CM | POA: Diagnosis not present

## 2016-05-16 DIAGNOSIS — I639 Cerebral infarction, unspecified: Secondary | ICD-10-CM

## 2016-05-16 DIAGNOSIS — J45909 Unspecified asthma, uncomplicated: Secondary | ICD-10-CM | POA: Diagnosis not present

## 2016-05-16 DIAGNOSIS — Z885 Allergy status to narcotic agent status: Secondary | ICD-10-CM | POA: Diagnosis not present

## 2016-05-16 DIAGNOSIS — Z7902 Long term (current) use of antithrombotics/antiplatelets: Secondary | ICD-10-CM | POA: Insufficient documentation

## 2016-05-16 DIAGNOSIS — R011 Cardiac murmur, unspecified: Secondary | ICD-10-CM | POA: Diagnosis not present

## 2016-05-16 DIAGNOSIS — I672 Cerebral atherosclerosis: Secondary | ICD-10-CM | POA: Insufficient documentation

## 2016-05-16 DIAGNOSIS — R531 Weakness: Secondary | ICD-10-CM | POA: Insufficient documentation

## 2016-05-16 DIAGNOSIS — Z91018 Allergy to other foods: Secondary | ICD-10-CM | POA: Diagnosis not present

## 2016-05-16 DIAGNOSIS — E039 Hypothyroidism, unspecified: Secondary | ICD-10-CM | POA: Diagnosis present

## 2016-05-16 DIAGNOSIS — K760 Fatty (change of) liver, not elsewhere classified: Secondary | ICD-10-CM | POA: Diagnosis present

## 2016-05-16 DIAGNOSIS — Z85850 Personal history of malignant neoplasm of thyroid: Secondary | ICD-10-CM | POA: Insufficient documentation

## 2016-05-16 DIAGNOSIS — Z8673 Personal history of transient ischemic attack (TIA), and cerebral infarction without residual deficits: Secondary | ICD-10-CM | POA: Insufficient documentation

## 2016-05-16 DIAGNOSIS — R200 Anesthesia of skin: Secondary | ICD-10-CM | POA: Diagnosis not present

## 2016-05-16 HISTORY — DX: Transient cerebral ischemic attack, unspecified: G45.9

## 2016-05-16 LAB — COMPREHENSIVE METABOLIC PANEL
ALBUMIN: 3.8 g/dL (ref 3.5–5.0)
ALT: 24 U/L (ref 14–54)
ANION GAP: 11 (ref 5–15)
AST: 20 U/L (ref 15–41)
Alkaline Phosphatase: 57 U/L (ref 38–126)
BUN: 12 mg/dL (ref 6–20)
CO2: 23 mmol/L (ref 22–32)
Calcium: 8 mg/dL — ABNORMAL LOW (ref 8.9–10.3)
Chloride: 104 mmol/L (ref 101–111)
Creatinine, Ser: 1 mg/dL (ref 0.44–1.00)
GFR calc Af Amer: >60 mL/min (ref >60)
GFR calc non Af Amer: >60 mL/min (ref >60)
GLUCOSE: 182 mg/dL — AB (ref 65–99)
POTASSIUM: 3.6 mmol/L (ref 3.5–5.1)
SODIUM: 138 mmol/L (ref 135–145)
TOTAL PROTEIN: 7.1 g/dL (ref 6.5–8.1)
Total Bilirubin: 0.7 mg/dL (ref 0.3–1.2)

## 2016-05-16 LAB — CBC
HCT: 38.4 % (ref 36.0–46.0)
Hemoglobin: 12.9 g/dL (ref 12.0–15.0)
MCH: 29.1 pg (ref 26.0–34.0)
MCHC: 33.6 g/dL (ref 30.0–36.0)
MCV: 86.7 fL (ref 78.0–100.0)
PLATELETS: 235 10*3/uL (ref 150–400)
RBC: 4.43 MIL/uL (ref 3.87–5.11)
RDW: 14 % (ref 11.5–15.5)
WBC: 6.8 10*3/uL (ref 4.0–10.5)

## 2016-05-16 LAB — I-STAT TROPONIN, ED: Troponin i, poc: 0 ng/mL (ref 0.00–0.08)

## 2016-05-16 LAB — I-STAT CHEM 8, ED
BUN: 14 mg/dL (ref 6–20)
CALCIUM ION: 1 mmol/L — AB (ref 1.15–1.40)
CHLORIDE: 105 mmol/L (ref 101–111)
Creatinine, Ser: 0.9 mg/dL (ref 0.44–1.00)
Glucose, Bld: 178 mg/dL — ABNORMAL HIGH (ref 65–99)
HCT: 37 % (ref 36.0–46.0)
HEMOGLOBIN: 12.6 g/dL (ref 12.0–15.0)
POTASSIUM: 3.6 mmol/L (ref 3.5–5.1)
SODIUM: 141 mmol/L (ref 135–145)
TCO2: 22 mmol/L (ref 0–100)

## 2016-05-16 LAB — GLUCOSE, CAPILLARY
GLUCOSE-CAPILLARY: 127 mg/dL — AB (ref 65–99)
GLUCOSE-CAPILLARY: 174 mg/dL — AB (ref 65–99)
Glucose-Capillary: 160 mg/dL — ABNORMAL HIGH (ref 65–99)

## 2016-05-16 LAB — DIFFERENTIAL
BASOS PCT: 0 %
Basophils Absolute: 0 10*3/uL (ref 0.0–0.1)
EOS ABS: 0.3 10*3/uL (ref 0.0–0.7)
EOS PCT: 4 %
Lymphocytes Relative: 27 %
Lymphs Abs: 1.8 10*3/uL (ref 0.7–4.0)
Monocytes Absolute: 0.4 10*3/uL (ref 0.1–1.0)
Monocytes Relative: 6 %
NEUTROS PCT: 63 %
Neutro Abs: 4.2 10*3/uL (ref 1.7–7.7)

## 2016-05-16 LAB — APTT: aPTT: 30 seconds (ref 24–36)

## 2016-05-16 LAB — CBG MONITORING, ED: GLUCOSE-CAPILLARY: 180 mg/dL — AB (ref 65–99)

## 2016-05-16 LAB — PROTIME-INR
INR: 1.03
PROTHROMBIN TIME: 13.5 s (ref 11.4–15.2)

## 2016-05-16 MED ORDER — HYDRALAZINE HCL 20 MG/ML IJ SOLN: 10.0000 mg | Freq: Four times a day (QID) | INTRAMUSCULAR | Status: DC | PRN

## 2016-05-16 MED ORDER — CLOPIDOGREL BISULFATE 75 MG PO TABS
75.0000 mg | ORAL_TABLET | Freq: Every day | ORAL | Status: DC
  Administered 2016-05-16 – 2016-05-17 (×2): 75 mg via ORAL
  Filled 2016-05-16 (×2): qty 1

## 2016-05-16 MED ORDER — ACETAMINOPHEN 325 MG PO TABS
650.0000 mg | ORAL_TABLET | ORAL | Status: DC | PRN
  Administered 2016-05-16 – 2016-05-17 (×2): 650 mg via ORAL
  Filled 2016-05-16 (×2): qty 2

## 2016-05-16 MED ORDER — PANTOPRAZOLE SODIUM 40 MG PO TBEC
40.0000 mg | DELAYED_RELEASE_TABLET | Freq: Every day | ORAL | Status: DC
Start: 2016-05-16 — End: 2016-05-17
  Administered 2016-05-16 – 2016-05-17 (×2): 40 mg via ORAL
  Filled 2016-05-16 (×2): qty 1

## 2016-05-16 MED ORDER — PRAVASTATIN SODIUM 40 MG PO TABS
20.0000 mg | ORAL_TABLET | Freq: Every day | ORAL | Status: DC
  Administered 2016-05-16: 20 mg via ORAL
  Filled 2016-05-16: qty 1

## 2016-05-16 MED ORDER — POTASSIUM CHLORIDE CRYS ER 20 MEQ PO TBCR
40.0000 meq | EXTENDED_RELEASE_TABLET | Freq: Two times a day (BID) | ORAL | Status: DC
  Administered 2016-05-16 – 2016-05-17 (×2): 40 meq via ORAL
  Filled 2016-05-16 (×2): qty 2

## 2016-05-16 MED ORDER — ALBUTEROL SULFATE (2.5 MG/3ML) 0.083% IN NEBU: 2.5000 mg | INHALATION_SOLUTION | RESPIRATORY_TRACT | Status: DC | PRN

## 2016-05-16 MED ORDER — ENOXAPARIN SODIUM 40 MG/0.4ML ~~LOC~~ SOLN
40.0000 mg | SUBCUTANEOUS | Status: DC
  Administered 2016-05-16: 40 mg via SUBCUTANEOUS
  Filled 2016-05-16: qty 0.4

## 2016-05-16 MED ORDER — ALBUTEROL SULFATE HFA 108 (90 BASE) MCG/ACT IN AERS: 2.0000 | INHALATION_SPRAY | RESPIRATORY_TRACT | Status: DC | PRN

## 2016-05-16 MED ORDER — STROKE: EARLY STAGES OF RECOVERY BOOK
Freq: Once | Status: DC
  Filled 2016-05-16: qty 1

## 2016-05-16 MED ORDER — FUROSEMIDE 20 MG PO TABS
80.0000 mg | ORAL_TABLET | Freq: Two times a day (BID) | ORAL | Status: DC
  Administered 2016-05-16 – 2016-05-17 (×2): 80 mg via ORAL
  Filled 2016-05-16 (×2): qty 4

## 2016-05-16 MED ORDER — DIPHENHYDRAMINE HCL 25 MG PO CAPS
25.0000 mg | ORAL_CAPSULE | Freq: Once | ORAL | Status: AC
  Administered 2016-05-16: 25 mg via ORAL
  Filled 2016-05-16: qty 1

## 2016-05-16 MED ORDER — ACETAMINOPHEN 650 MG RE SUPP: 650.0000 mg | RECTAL | Status: DC | PRN

## 2016-05-16 MED ORDER — LEVOTHYROXINE SODIUM 150 MCG PO TABS
300.0000 ug | ORAL_TABLET | Freq: Every day | ORAL | Status: DC
  Administered 2016-05-17: 300 ug via ORAL
  Filled 2016-05-16: qty 2
  Filled 2016-05-16: qty 4

## 2016-05-16 MED ORDER — INSULIN ASPART 100 UNIT/ML ~~LOC~~ SOLN
0.0000 [IU] | SUBCUTANEOUS | Status: DC
Start: 2016-05-16 — End: 2016-05-17
  Administered 2016-05-16 – 2016-05-17 (×3): 2 [IU] via SUBCUTANEOUS
  Administered 2016-05-17: 3 [IU] via SUBCUTANEOUS
  Administered 2016-05-17: 1 [IU] via SUBCUTANEOUS

## 2016-05-16 MED ORDER — LISINOPRIL 10 MG PO TABS
10.0000 mg | ORAL_TABLET | Freq: Every day | ORAL | Status: DC
  Administered 2016-05-16 – 2016-05-17 (×2): 10 mg via ORAL
  Filled 2016-05-16 (×2): qty 1

## 2016-05-16 MED ORDER — ACETAMINOPHEN 160 MG/5ML PO SOLN: 650.0000 mg | ORAL | Status: DC | PRN

## 2016-05-16 MED ORDER — ATENOLOL 50 MG PO TABS
50.0000 mg | ORAL_TABLET | Freq: Two times a day (BID) | ORAL | Status: DC
  Administered 2016-05-16 – 2016-05-17 (×2): 50 mg via ORAL
  Filled 2016-05-16: qty 2
  Filled 2016-05-16 (×2): qty 1
  Filled 2016-05-16: qty 2
  Filled 2016-05-16: qty 1

## 2016-05-16 NOTE — ED Notes (Signed)
Patient transported to MRI 

## 2016-05-16 NOTE — ED Notes (Signed)
[facility] contacted to activate Code Stroke 

## 2016-05-16 NOTE — H&P (Signed)
History and Physical    Glendola Renderos [id number] DOB: 11/25/1960 DOA: 05/16/2016   PCP: Kristine Garbe, MD   Patient coming from/Resides with: Private residence  Admission status: Observation/telemetry -it may be medically necessary to stay a minimum 2 midnights to rule out impending and/or unexpected changes in physiologic status that may differ from initial evaluation performed in the ER and/or at time of admission.   Chief Complaint: Left facial and left arm numbness  HPI: Athene Holbert is a 56 y.o. female with medical history significant for diabetes on metformin, hypertension, dyslipidemia, hypothyroidism, known thoracic aortic aneurysm, obesity, nonocclusive CAD per cardiac cath January 2016, prior radiation therapy for thyroid cancer. Patient hospitalized June 2017 with slurred speech and right facial numbness with stroke workup negative. Patient presents today with left facial numbness and left arm numbness and weakness with decreased grip noted in triage. In terms began at 11 AM. Upon presentation code stroke was called. Patient was evaluated by neurology who felt symptoms were too mild to proceed with TPA. Initial CT head was negative. Neurologist has ordered stat MRI of brain. Hospitalist service called for admission. Unable to initially evaluate the patient when called secondary to patient being in MRI. Also added MRA head to previous order for MRI.  ED Course:  Vital Signs: BP 131/63   Pulse 83   Resp 15   Ht 5\' 7"  (1.702 m)   Wt 108.2 kg (238 lb 9 oz)   SpO2 100%   BMI 37.36 kg/m  CT head without contrast: No acute findings Lab data: Sodium 138, potassium 3.6, chloride 104, CO2 23, glucose 182, BUN 12, creatinine 1.00, calcium 8, anion gap 11, LFTs normal, poc troponin 0.00, white count 6800 with normal differential, hemoglobin 12.9, platelets 235,000, coags normal Medications and treatments: None  Review of Systems:  In addition to the HPI above,  No Fever-chills,  myalgias or other constitutional symptoms No Headache, changes with Vision or hearing, dizziness, gait disturbance or imbalance, tremors or seizure activity No problems swallowing food or Liquids, indigestion/reflux, choking or coughing while eating, abdominal pain with or after eating No Chest pain, Cough or Shortness of Breath, palpitations, orthopnea or DOE No Abdominal pain, N/V, melena,hematochezia, dark tarry stools, constipation No dysuria, malodorous urine, hematuria or flank pain No new skin rashes, lesions, masses or bruises, No new joint pains, aches, swelling or redness No recent unintentional weight gain or loss No polyuria, polydypsia or polyphagia   Past Medical History:  Diagnosis Date  . Anemia    hx of   . 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 Asthma    due to allergies   . Cancer ([place])   . Complication of anesthesia   . Diabetes mellitus   . Dysrhythmia    " irregular heart rate per patient"  . Family history of adverse reaction to anesthesia    difficult for father to wake after surgery  . Heart murmur   . Hypercholesteremia   . Hypertension   . PONV (postoperative nausea and vomiting)   . Thyroid disease   . TIA (transient ischemic attack)     Past Surgical History:  Procedure Laterality Date  . ABDOMINAL HYSTERECTOMY    . CHOLECYSTECTOMY    . COLON SURGERY    . KNEE ARTHROSCOPY    . LEFT HEART CATHETERIZATION WITH CORONARY ANGIOGRAM N/A 04/24/2014   Procedure: LEFT HEART CATHETERIZATION WITH CORONARY ANGIOGRAM;  Surgeon: Burnell Blanks, MD;  Location: [facility] CATH LAB;  Service: Cardiovascular;  Laterality: N/A;  . THYROIDECTOMY N/A 09/20/2014   Procedure: TOTAL THYROIDECTOMY;  Surgeon: Armandina Gemma, MD;  Location: [facility] ORS;  Service: General;  Laterality: N/A;  . TONSILLECTOMY      Social History   Social History  . Marital status: Married    Spouse name: N/A  . Number of children: N/A  . Years of education: N/A   Occupational History  . Not on  file.   Social History Main Topics  . Smoking status: Never Smoker  . Smokeless tobacco: Never Used  . Alcohol use No  . Drug use: No  . Sexual activity: Not on file   Other Topics Concern  . Not on file   Social History Narrative  . No narrative on file    Mobility: Without assistive devices Work history: Not obtained   Allergies  Allergen Reactions  . Lorabid [Loracarbef] Anaphylaxis  . Augmentin [Amoxicillin-Pot Clavulanate] Hives and Nausea And Vomiting    Chest pain  . Codeine Nausea And Vomiting  . Sulfa Drugs Cross Reactors Other (See Comments)    Severe allergic reaction as a child - was not told symptoms  . Benicar [Olmesartan Medoxomil] Swelling and Rash  . Other Rash    Green peas    Family History  Problem Relation Age of Onset  . Heart failure Mother   . Diabetes Mother   . Stroke Father   . Heart failure Father   . Cancer Father      Prior to Admission medications   Medication Sig Start Date End Date Taking? Authorizing Provider  acetaminophen (TYLENOL) 325 MG tablet Take 650 mg by mouth every 4 (four) hours as needed for mild pain or headache.   Yes Historical Provider, MD  albuterol (PROVENTIL HFA;VENTOLIN HFA) 108 (90 BASE) MCG/ACT inhaler Inhale 2 puffs into the lungs every 4 (four) hours as needed for wheezing or shortness of breath. 09/07/13  Yes Leeanne Rio, MD  atenolol (TENORMIN) 50 MG tablet Take 50 mg by mouth 2 (two) times daily.   Yes Historical Provider, MD  calcium-vitamin D (OSCAL WITH D) 500-200 MG-UNIT per tablet Take 1 tablet by mouth 3 (three) times daily. Patient taking differently: Take 2 tablets by mouth 3 (three) times daily.  09/20/14  Yes Armandina Gemma, MD  clopidogrel (PLAVIX) 75 MG tablet Take 1 tablet (75 mg total) by mouth daily. 09/15/15  Yes Florencia Reasons, MD  furosemide (LASIX) 40 MG tablet Please continue to hold today and tomorrow, resume back on Monday Patient taking differently: Take 80 mg by mouth 2 (two) times  daily.  08/23/15  Yes Albertine Patricia, MD  lansoprazole (PREVACID) 15 MG capsule Take 1 capsule (15 mg total) by mouth daily at 12 noon. 03/07/15  Yes Sharlett Iles, MD  levothyroxine (SYNTHROID, LEVOTHROID) 300 MCG tablet Take 300 mcg by mouth daily before breakfast.   Yes Historical Provider, MD  lisinopril (PRINIVIL,ZESTRIL) 10 MG tablet Take 10 mg by mouth daily.  09/09/14  Yes Historical Provider, MD  metFORMIN (GLUCOPHAGE) 1000 MG tablet Take 1 tablet (1,000 mg total) by mouth 2 (two) times daily with a meal. 04/29/14  Yes Maryann Mikhail, DO  potassium chloride SA (K-DUR,KLOR-CON) 20 MEQ tablet Take 40 mEq by mouth 2 (two) times daily.    Yes Historical Provider, MD  pravastatin (PRAVACHOL) 20 MG tablet Take 20 mg by mouth daily.  06/25/13  Yes Historical Provider, MD  Probiotic Product (PROBIOTIC PO) Take 1 tablet by mouth daily.  Yes Historical Provider, MD  promethazine (PHENERGAN) 25 MG tablet Take 1 tablet (25 mg total) by mouth every 6 (six) hours as needed for nausea or vomiting. 10/11/15  Yes Virgel Manifold, MD  clindamycin (CLEOCIN) 150 MG capsule Take 450 mg by mouth 2 (two) times daily. Starting 10/01/15 for 14 days 10/01/15   Historical Provider, MD  ondansetron (ZOFRAN) 4 MG tablet Take 8 mg by mouth every 8 (eight) hours as needed for nausea or vomiting.    Historical Provider, MD    Physical Exam: Vitals:   05/16/16 1323 05/16/16 1324 05/16/16 1331 05/16/16 1333  BP: 131/63     Pulse:  80 83   Resp: 17 19 15    SpO2:  100%    Weight:    108.2 kg (238 lb 9 oz)  Height:    5\' 7"  (1.702 m)      Constitutional: NAD, calm, comfortable Eyes: PERRL, lids and conjunctivae normal ENMT: Mucous membranes are dry. Posterior pharynx clear of any exudate or lesions.Normal dentition.  Neck: normal, supple, no masses, no thyromegaly Respiratory: clear to auscultation bilaterally, no wheezing, no crackles. Normal respiratory effort. No accessory muscle use.  Cardiovascular:  Regular rate and rhythm, no murmurs / rubs / gallops. No extremity edema. 2+ pedal pulses. No carotid bruits.  Abdomen: no tenderness, no masses palpated. No hepatosplenomegaly. Bowel sounds positive.  Musculoskeletal: no clubbing / cyanosis. No joint deformity upper and lower extremities. Good ROM, no contractures. Normal muscle tone.  Skin: no rashes, lesions, ulcers. No induration Neurologic: CN 2-12 grossly intact. Sensation intact: 5/5 on right, 4/5 on left, DTR normal. Strength 5/5 on right, 4-5/5 on left with primary grip weakness and left flexor resistance weakness of foot  Psychiatric: Normal judgment and insight. Alert and oriented x 3. Normal mood.    Labs on Admission: I have personally reviewed following labs and imaging studies  CBC:  Recent Labs Lab 05/16/16 1330 05/16/16 1343  WBC 6.8  --   NEUTROABS 4.2  --   HGB 12.9 12.6  HCT 38.4 37.0  MCV 86.7  --   PLT 235  --    Basic Metabolic Panel:  Recent Labs Lab 05/16/16 1330 05/16/16 1343  NA 138 141  K 3.6 3.6  CL 104 105  CO2 23  --   GLUCOSE 182* 178*  BUN 12 14  CREATININE 1.00 0.90  CALCIUM 8.0*  --    GFR: Estimated Creatinine Clearance: 90.5 mL/min (by C-G formula based on SCr of 0.9 mg/dL). Liver Function Tests:  Recent Labs Lab 05/16/16 1330  AST 20  ALT 24  ALKPHOS 57  BILITOT 0.7  PROT 7.1  ALBUMIN 3.8   No results for input(s): LIPASE, AMYLASE in the last 168 hours. No results for input(s): AMMONIA in the last 168 hours. Coagulation Profile:  Recent Labs Lab 05/16/16 1330  INR 1.03   Cardiac Enzymes: No results for input(s): CKTOTAL, CKMB, CKMBINDEX, TROPONINI in the last 168 hours. BNP (last 3 results) No results for input(s): PROBNP in the last 8760 hours. HbA1C: No results for input(s): HGBA1C in the last 72 hours. CBG:  Recent Labs Lab 05/16/16 1302  GLUCAP 180*   Lipid Profile: No results for input(s): CHOL, HDL, LDLCALC, TRIG, CHOLHDL, LDLDIRECT in the last 72  hours. Thyroid Function Tests: No results for input(s): TSH, T4TOTAL, FREET4, T3FREE, THYROIDAB in the last 72 hours. Anemia Panel: No results for input(s): VITAMINB12, FOLATE, FERRITIN, TIBC, IRON, RETICCTPCT in the last 72 hours. Urine analysis:  Component Value Date/Time   COLORURINE YELLOW 10/11/2015 1315   APPEARANCEUR CLEAR 10/11/2015 1315   LABSPEC 1.016 10/11/2015 1315   PHURINE 6.0 10/11/2015 1315   GLUCOSEU NEGATIVE 10/11/2015 1315   HGBUR NEGATIVE 10/11/2015 1315   BILIRUBINUR NEGATIVE 10/11/2015 1315   KETONESUR NEGATIVE 10/11/2015 1315   PROTEINUR 30 (A) 10/11/2015 1315   UROBILINOGEN 0.2 10/07/2014 1009   NITRITE NEGATIVE 10/11/2015 1315   LEUKOCYTESUR TRACE (A) 10/11/2015 1315   Sepsis Labs: @LABRCNTIP (procalcitonin:4,lacticidven:4) )No results found for this or any previous visit (from the past 240 hour(s)).   Radiological Exams on Admission: Ct Head Code Stroke W/o Cm  Result Date: 05/16/2016 CLINICAL DATA:  Code stroke. Left arm weakness. Left-sided facial numbness beginning today 11. EXAM: CT HEAD WITHOUT CONTRAST TECHNIQUE: Contiguous axial images were obtained from the base of the skull through the vertex without intravenous contrast. COMPARISON:  09/13/2015 FINDINGS: Brain: No evidence of acute infarction, hemorrhage, hydrocephalus, extra-axial collection or mass lesion/mass effect. Vascular: No hyperdense vessel or unexpected calcification. Skull: No acute or aggressive finding. Small right frontal osteoma along the inner table. Sinuses/Orbits: No acute finding. Other: Text page with results sent at the time of interpretation on 05/16/2016 at 1:23 pm to Dr. Orlena Sheldon. ASPECTS [facility] Stroke Program Early CT Score) - Ganglionic level infarction (caudate, lentiform nuclei, internal capsule, insula, M1-M3 cortex): 7 - Supraganglionic infarction (M4-M6 cortex): 3 Total score (0-10 with 10 being normal): 10 IMPRESSION: No acute finding.ASPECTS is 10. Electronically  Signed   By: Monte Fantasia M.D.   On: 05/16/2016 13:24    EKG: (Independently reviewed) sinus rhythm with ventricular rate 81 beats minute, QTC 462 ms, low-voltage R waves with abnormal R-wave progression, no acute ischemic changes  Assessment/Plan Principal Problem:   Stroke-like symptoms -Patient presents with reports of left facial and arm numbness with notable left hand grip weak in triage; apparent transient dysarthria as well. Upon neurologist evaluation strength was symmetrical and 5/5 with decreased sensation left face and arm -Neurology following -Ischemic stroke/TIA order set initiated -Stat MRI/MRA brain-NO ACUTE CVA-NO INTRACRANIAL VASCULAR ABNORMALITIES -Echocardiogram -Carotid duplex -PT/OT/SLP evaluation -Hemoglobin A1c/lipid panel -Antiplatelet: On Plavix prior to admission-if infarct found on MRI neurologist documented "may consider changing Plavix to Aggrenox. If not continue Plavix." -RN stroke swallow screen-NPO ill completed  Active Problems:   Type 2 diabetes mellitus -Hold metformin acutely -HgbA1c is above -SSI    Hypertension -Current blood pressure control -Continue preadmission Prinivil and Lasix  -If acute stroke may wish to consider allowing for permissive hypertension and discontinue above medications -Hydralazine IV prn with parameters    Hypercholesteremia -Continue Pravachol -Follow up with the panel    Thoracic aortic aneurysm  -No reported chest pain    Hypothyroid/history thyroid cancer -Continue Synthroid -Reports chronic dry mouth -States has renal mass that is "being watched" possible malignant etiology    Nonobstructive CAD -Diagnosed on cardiac catheterization in 2016    Fatty liver disease -Diffuse fatty infiltration of liver noted on MR abdomen October 2017      DVT prophylaxis: Lovenox Code Status: Full Family Communication: Family at bedside/daughter  Disposition Plan: Back to previous home environment Consults  called: Neurology/Eshraghi   Merida Alcantar L. ANP-BC [facility] Pager [phone]   If 7PM-7AM, please contact night-coverage [email or web address] Password [facility]  05/16/2016, 3:42 PM

## 2016-05-16 NOTE — ED Provider Notes (Signed)
[place] DEPT Provider Note   CSN: [id number] Arrival date & time: 05/16/16  1249     History   Chief Complaint Chief Complaint  Patient presents with  . Code Stroke    HPI [name] is a 56 y.o. female.  HPI   [name] is a 56 y.o. female, with a history of TIA, anemia, DM, and HTN, presenting to the ED with Concern for stroke. Patient endorses left-sided facial numbness beginning around 11 AM morning. To this, patient states that she was completely normal. Shortly after 11 AM patient began to have left arm weakness. Patient states she had a TIA June 2017. She is compliant with her Plavix.  Patients daughter is at the bedside and states that she appreciated no facial droop, slurred speech, confusion, abnormal behavior, expressive or receptive aphasia, or any other abnormalities.  Denies recent illness, falls/trauma, difficulty breathing, difficulty swallowing, chest pain, N/V/D, or any other complaints.   Past Medical History:  Diagnosis Date  . Anemia    hx of   . 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 Asthma    due to allergies   . Cancer ([place])   . Complication of anesthesia   . Diabetes mellitus   . Dysrhythmia    " irregular heart rate per patient"  . Family history of adverse reaction to anesthesia    difficult for father to wake after surgery  . Heart murmur   . Hypercholesteremia   . Hypertension   . PONV (postoperative nausea and vomiting)   . Thyroid disease   . TIA (transient ischemic attack)     Patient Active Problem List   Diagnosis Date Noted  . Stroke-like symptoms 05/16/2016  . 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 Fatty liver disease, nonalcoholic [id number]  . Slurred speech 09/13/2015  . Parotitis 09/13/2015  . Gastroenteritis 08/21/2015  . Hypothyroid 08/21/2015  . 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 Pain in the chest   . Essential hypertension   .  Thoracic aortic aneurysm ([place])   . Chest pain 04/23/2014  . Anginal pain ([place]) 04/23/2014  . Type 2 diabetes mellitus ([place])   . Hypertension   . Hypercholesteremia     Past Surgical History:  Procedure Laterality Date  . ABDOMINAL HYSTERECTOMY    . CHOLECYSTECTOMY    . COLON SURGERY    . KNEE ARTHROSCOPY    . LEFT HEART CATHETERIZATION WITH CORONARY ANGIOGRAM N/A 04/24/2014   Procedure: LEFT HEART CATHETERIZATION WITH CORONARY ANGIOGRAM;  Surgeon: Burnell Blanks, MD;  Location: [facility] CATH LAB;  Service: Cardiovascular;  Laterality: N/A;  . THYROIDECTOMY N/A 09/20/2014   Procedure: TOTAL THYROIDECTOMY;  Surgeon: Armandina Gemma, MD;  Location: [facility] ORS;  Service: General;  Laterality: N/A;  . TONSILLECTOMY      OB History    No data available       Home Medications    Prior to Admission medications   Medication Sig Start Date End Date Taking? Authorizing Provider  acetaminophen (TYLENOL) 325 MG tablet Take 650 mg by mouth every 4 (four) hours as needed for mild pain or headache.   Yes Historical Provider, MD  albuterol (PROVENTIL HFA;VENTOLIN HFA) 108 (90 BASE) MCG/ACT inhaler Inhale 2 puffs into the lungs every 4 (four) hours as needed for wheezing or shortness of breath. 09/07/13  Yes Leeanne Rio, MD  atenolol (TENORMIN) 50 MG tablet Take 50 mg by mouth 2 (two)  times daily.   Yes Historical Provider, MD  calcium-vitamin D (OSCAL WITH D) 500-200 MG-UNIT per tablet Take 1 tablet by mouth 3 (three) times daily. Patient taking differently: Take 2 tablets by mouth 3 (three) times daily.  09/20/14  Yes Armandina Gemma, MD  clopidogrel (PLAVIX) 75 MG tablet Take 1 tablet (75 mg total) by mouth daily. 09/15/15  Yes Florencia Reasons, MD  furosemide (LASIX) 40 MG tablet Please continue to hold today and tomorrow, resume back on Monday Patient taking differently: Take 80 mg by mouth 2 (two) times daily.  08/23/15  Yes Albertine Patricia, MD  lansoprazole (PREVACID) 15 MG capsule Take 1 capsule (15  mg total) by mouth daily at 12 noon. 03/07/15  Yes Sharlett Iles, MD  levothyroxine (SYNTHROID, LEVOTHROID) 300 MCG tablet Take 300 mcg by mouth daily before breakfast.   Yes Historical Provider, MD  lisinopril (PRINIVIL,ZESTRIL) 10 MG tablet Take 10 mg by mouth daily.  09/09/14  Yes Historical Provider, MD  metFORMIN (GLUCOPHAGE) 1000 MG tablet Take 1 tablet (1,000 mg total) by mouth 2 (two) times daily with a meal. 04/29/14  Yes Maryann Mikhail, DO  potassium chloride SA (K-DUR,KLOR-CON) 20 MEQ tablet Take 40 mEq by mouth 2 (two) times daily.    Yes Historical Provider, MD  pravastatin (PRAVACHOL) 20 MG tablet Take 20 mg by mouth daily.  06/25/13  Yes Historical Provider, MD  Probiotic Product (PROBIOTIC PO) Take 1 tablet by mouth daily.   Yes Historical Provider, MD  promethazine (PHENERGAN) 25 MG tablet Take 1 tablet (25 mg total) by mouth every 6 (six) hours as needed for nausea or vomiting. 10/11/15  Yes Virgel Manifold, MD  clindamycin (CLEOCIN) 150 MG capsule Take 450 mg by mouth 2 (two) times daily. Starting 10/01/15 for 14 days 10/01/15   Historical Provider, MD  ondansetron (ZOFRAN) 4 MG tablet Take 8 mg by mouth every 8 (eight) hours as needed for nausea or vomiting.    Historical Provider, MD    Family History Family History  Problem Relation Age of Onset  . Heart failure Mother   . Diabetes Mother   . Stroke Father   . Heart failure Father   . Cancer Father     Social History Social History  Substance Use Topics  . Smoking status: Never Smoker  . Smokeless tobacco: Never Used  . Alcohol use No     Allergies   Lorabid [loracarbef]; Augmentin [amoxicillin-pot clavulanate]; Codeine; Sulfa drugs cross reactors; Benicar [olmesartan medoxomil]; and Other   Review of Systems Review of Systems  Constitutional: Negative for chills and fever.  HENT: Negative for drooling and trouble swallowing.   Respiratory: Negative for cough and shortness of breath.   Cardiovascular:  Negative for chest pain.  Gastrointestinal: Negative for abdominal pain, diarrhea, nausea and vomiting.  Musculoskeletal: Negative for neck pain and neck stiffness.  Neurological: Positive for weakness and numbness. Negative for dizziness, syncope, speech difficulty, light-headedness and headaches.  All other systems reviewed and are negative.    Physical Exam Updated Vital Signs BP 131/63   Pulse 83   Resp 15   Ht 5\' 7"  (1.702 m)   Wt 108.2 kg   SpO2 100%   BMI 37.36 kg/m   Physical Exam  Constitutional: She is oriented to person, place, and time. She appears well-developed and well-nourished. No distress.  HENT:  Head: Normocephalic and atraumatic.  Mouth/Throat: Oropharynx is clear and moist.  Eyes: Conjunctivae and EOM are normal. Pupils are equal, round, and  reactive to light.  Neck: Normal range of motion. Neck supple.  Cardiovascular: Normal rate, regular rhythm, normal heart sounds and intact distal pulses.   Pulmonary/Chest: Effort normal and breath sounds normal. No respiratory distress.  Abdominal: Soft. There is no tenderness. There is no guarding.  Musculoskeletal: She exhibits no edema.  Lymphadenopathy:    She has no cervical adenopathy.  Neurological: She is alert and oriented to person, place, and time.  Grip strength 4 out of 5 in the left upper extremity. No noted arm drift.  Strength 4 out of 5 in the left lower extremity, noted with plantar and dorsiflexion of the left foot.  All other strength testing 5 out of 5. Endorses decreased sensation to light touch on the left side of the face.  Speech is clear. No noted difficulty with expression or understanding. No facial droop. No noted difficulty swallowing. Patient handles oral secretions without difficulty. No other sensory deficits.  Coordination intact including heel to shin and finger to nose. No upright ataxia. Cranial nerves III-XII otherwise grossly intact.  Skin: Skin is warm and dry. She is not  diaphoretic.  Psychiatric: She has a normal mood and affect. Her behavior is normal.  Nursing note and vitals reviewed.    ED Treatments / Results  Labs (all labs ordered are listed, but only abnormal results are displayed) Labs Reviewed  COMPREHENSIVE METABOLIC PANEL - Abnormal; Notable for the following:       Result Value   Glucose, Bld 182 (*)    Calcium 8.0 (*)    All other components within normal limits  CBG MONITORING, ED - Abnormal; Notable for the following:    Glucose-Capillary 180 (*)    All other components within normal limits  I-STAT CHEM 8, ED - Abnormal; Notable for the following:    Glucose, Bld 178 (*)    Calcium, Ion 1.00 (*)    All other components within normal limits  PROTIME-INR  APTT  CBC  DIFFERENTIAL  HIV ANTIBODY (ROUTINE TESTING)  I-STAT TROPOININ, ED    EKG  EKG Interpretation  Date/Time:  Sunday May 16 2016 13:30:12 EST Ventricular Rate:  81 PR Interval:    QRS Duration: 110 QT Interval:  398 QTC Calculation: 462 R Axis:   -154 Text Interpretation:  Sinus rhythm T wave abnormality Artifact Abnormal ekg Confirmed by Carmin Muskrat  MD [phone]) on 05/16/2016 2:13:38 PM       Radiology Ct Head Code Stroke W/o Cm  Result Date: 05/16/2016 CLINICAL DATA:  Code stroke. Left arm weakness. Left-sided facial numbness beginning today 11. EXAM: CT HEAD WITHOUT CONTRAST TECHNIQUE: Contiguous axial images were obtained from the base of the skull through the vertex without intravenous contrast. COMPARISON:  09/13/2015 FINDINGS: Brain: No evidence of acute infarction, hemorrhage, hydrocephalus, extra-axial collection or mass lesion/mass effect. Vascular: No hyperdense vessel or unexpected calcification. Skull: No acute or aggressive finding. Small right frontal osteoma along the inner table. Sinuses/Orbits: No acute finding. Other: Text page with results sent at the time of interpretation on 05/16/2016 at 1:23 pm to Dr. Orlena Sheldon. ASPECTS [facility]  Stroke Program Early CT Score) - Ganglionic level infarction (caudate, lentiform nuclei, internal capsule, insula, M1-M3 cortex): 7 - Supraganglionic infarction (M4-M6 cortex): 3 Total score (0-10 with 10 being normal): 10 IMPRESSION: No acute finding.ASPECTS is 10. Electronically Signed   By: Monte Fantasia M.D.   On: 05/16/2016 13:24    Procedures Procedures (including critical care time)  Medications Ordered in ED Medications  clopidogrel (PLAVIX)  tablet 75 mg (not administered)  furosemide (LASIX) tablet 80 mg (not administered)  levothyroxine (SYNTHROID, LEVOTHROID) tablet 300 mcg (not administered)  pantoprazole (PROTONIX) EC tablet 40 mg (not administered)  lisinopril (PRINIVIL,ZESTRIL) tablet 10 mg (not administered)  albuterol (PROVENTIL HFA;VENTOLIN HFA) 108 (90 Base) MCG/ACT inhaler 2 puff (not administered)  pravastatin (PRAVACHOL) tablet 20 mg (not administered)  atenolol (TENORMIN) tablet 50 mg (not administered)  potassium chloride SA (K-DUR,KLOR-CON) CR tablet 40 mEq (not administered)   stroke: mapping our early stages of recovery book (not administered)  acetaminophen (TYLENOL) tablet 650 mg (not administered)    Or  acetaminophen (TYLENOL) solution 650 mg (not administered)    Or  acetaminophen (TYLENOL) suppository 650 mg (not administered)  enoxaparin (LOVENOX) injection 40 mg (not administered)  insulin aspart (novoLOG) injection 0-9 Units (not administered)  hydrALAZINE (APRESOLINE) injection 10 mg (not administered)     Initial Impression / Assessment and Plan / ED Course  I have reviewed the triage vital signs and the nursing notes.  Pertinent labs & imaging results that were available during my care of the patient were reviewed by me and considered in my medical decision making (see chart for details).      Patient presents with code stroke. Last seen normal prior to 11 AM this morning. Objective findings of left-sided weakness. Head CT without acute  abnormality. Dr. Orlena Sheldon, neurologist, evaluated this patient. tPA not recommended. Pt admitted by Erin Hearing, NP with the hospitalist service. MR brain is pending upon admission.   Findings and plan of care discussed with Carmin Muskrat, MD. Dr. Vanita Panda personally evaluated and examined this patient.   Vitals:   05/16/16 1324 05/16/16 1331 05/16/16 1333 05/16/16 1430  BP:    114/71  Pulse: 80 83  77  Resp: 19 15  19   SpO2: 100%   100%  Weight:   108.2 kg   Height:   5\' 7"  (1.702 m)      Final Clinical Impressions(s) / ED Diagnoses   Final diagnoses:  Left-sided weakness    New Prescriptions New Prescriptions   No medications on file     Layla Maw 05/16/16 [place], MD 05/21/16 1218

## 2016-05-16 NOTE — Progress Notes (Signed)
Pt arrived to unit. No distress, complains of headache. Oriented X 4. Oriented to the unit.

## 2016-05-16 NOTE — ED Triage Notes (Signed)
Activated code stroke at triage. Pt reports onset approx 1100 of left side facial numbness, left arm numbness. Left grip is weaker at triage.

## 2016-05-16 NOTE — Code Documentation (Signed)
Code stroke called at 1306, patient arrived to [facility] ED via private vehicle at 1249.  As per patient LSN 1100, developed sudden onset of tingling and numbness on left side of face that progressed to left arm, also endorses trouble getting her words out.  NIHSS 1, symptoms are improving.

## 2016-05-16 NOTE — Consult Note (Signed)
Reason for Consult: Code stroke Referring Physician: Dr. Serita Grammes is an 56 y.o. female.  HPI: Occipital headache sharp pains associated with left face and arm numbness and tingling.  The headache is gone now.  No prior history of migraines.  LSN 11 am.  CT Brain was negative.  No slurred speech.  No weakness.  Similar event happened a month ago but was resolved by the time she came to the  Hospital.  She takes Plavix and missed one dose 3 days ago.    Past Medical History:  Diagnosis Date  . Anemia    hx of   . 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 Asthma    due to allergies   . Cancer ([place])   . Complication of anesthesia   . Diabetes mellitus   . Dysrhythmia    " irregular heart rate per patient"  . Family history of adverse reaction to anesthesia    difficult for father to wake after surgery  . Heart murmur   . Hypercholesteremia   . Hypertension   . PONV (postoperative nausea and vomiting)   . Thyroid disease   . TIA (transient ischemic attack)     Past Surgical History:  Procedure Laterality Date  . ABDOMINAL HYSTERECTOMY    . CHOLECYSTECTOMY    . COLON SURGERY    . KNEE ARTHROSCOPY    . LEFT HEART CATHETERIZATION WITH CORONARY ANGIOGRAM N/A 04/24/2014   Procedure: LEFT HEART CATHETERIZATION WITH CORONARY ANGIOGRAM;  Surgeon: Burnell Blanks, MD;  Location[facility] CATH LAB;  Service: Cardiovascular;  Laterality: N/A;  . THYROIDECTOMY N/A 09/20/2014   Procedure: TOTAL THYROIDECTOMY;  Surgeon: Armandina Gemma, MD;  Location: [facility] ORS;  Service: General;  Laterality: N/A;  . TONSILLECTOMY      Family History  Problem Relation Age of Onset  . Heart failure Mother   . Diabetes Mother   . Stroke Father   . Heart failure Father   . Cancer Father     Social History:  reports that she has never smoked. She has never used smokeless tobacco. She reports that she does not drink alcohol or use drugs.  Allergies:  Allergies  Allergen Reactions  . Lorabid [Loracarbef]  Anaphylaxis  . Augmentin [Amoxicillin-Pot Clavulanate] Hives and Nausea And Vomiting    Chest pain  . Codeine Nausea And Vomiting  . Sulfa Drugs Cross Reactors Other (See Comments)    Severe allergic reaction as a child - was not told symptoms  . Benicar [Olmesartan Medoxomil] Swelling and Rash  . Other Rash    Green peas    Prior to Admission medications   Medication Sig Start Date End Date Taking? Authorizing Provider  acetaminophen (TYLENOL) 325 MG tablet Take 650 mg by mouth every 4 (four) hours as needed for mild pain or headache.    Historical Provider, MD  albuterol (PROVENTIL HFA;VENTOLIN HFA) 108 (90 BASE) MCG/ACT inhaler Inhale 2 puffs into the lungs every 4 (four) hours as needed for wheezing or shortness of breath. 09/07/13   Leeanne Rio, MD  atenolol (TENORMIN) 50 MG tablet Take 50 mg by mouth 2 (two) times daily.    Historical Provider, MD  calcium-vitamin D (OSCAL WITH D) 500-200 MG-UNIT per tablet Take 1 tablet by mouth 3 (three) times daily. Patient taking differently: Take 2 tablets by mouth 3 (three) times daily.  09/20/14   Armandina Gemma, MD  clindamycin (CLEOCIN) 150 MG capsule Take 450 mg by mouth  2 (two) times daily. Starting 10/01/15 for 14 days 10/01/15   Historical Provider, MD  clopidogrel (PLAVIX) 75 MG tablet Take 1 tablet (75 mg total) by mouth daily. 09/15/15   Florencia Reasons, MD  furosemide (LASIX) 40 MG tablet Please continue to hold today and tomorrow, resume back on Monday Patient taking differently: Take 80 mg by mouth 2 (two) times daily.  08/23/15   Silver Huguenin Elgergawy, MD  lansoprazole (PREVACID) 15 MG capsule Take 1 capsule (15 mg total) by mouth daily at 12 noon. 03/07/15   Sharlett Iles, MD  levothyroxine (SYNTHROID, LEVOTHROID) 300 MCG tablet Take 300 mcg by mouth daily before breakfast.    Historical Provider, MD  lisinopril (PRINIVIL,ZESTRIL) 10 MG tablet Take 10 mg by mouth daily.  09/09/14   Historical Provider, MD  metFORMIN (GLUCOPHAGE) 1000 MG  tablet Take 1 tablet (1,000 mg total) by mouth 2 (two) times daily with a meal. 04/29/14   Maryann Mikhail, DO  ondansetron (ZOFRAN) 4 MG tablet Take 8 mg by mouth every 8 (eight) hours as needed for nausea or vomiting.    Historical Provider, MD  potassium chloride SA (K-DUR,KLOR-CON) 20 MEQ tablet Take 40 mEq by mouth 2 (two) times daily.     Historical Provider, MD  pravastatin (PRAVACHOL) 20 MG tablet Take 20 mg by mouth daily.  06/25/13   Historical Provider, MD  Probiotic Product (PROBIOTIC PO) Take 1 tablet by mouth daily.    Historical Provider, MD  promethazine (PHENERGAN) 25 MG tablet Take 1 tablet (25 mg total) by mouth every 6 (six) hours as needed for nausea or vomiting. 10/11/15   Virgel Manifold, MD    Medications: Scheduled:  Results for orders placed or performed during the hospital encounter of 05/16/16 (from the past 48 hour(s))  CBG monitoring, ED     Status: Abnormal   Collection Time: 05/16/16  1:02 PM  Result Value Ref Range   Glucose-Capillary 180 (H) 65 - 99 mg/dL    Ct Head Code Stroke W/o Cm  Result Date: 05/16/2016 CLINICAL DATA:  Code stroke. Left arm weakness. Left-sided facial numbness beginning today 11. EXAM: CT HEAD WITHOUT CONTRAST TECHNIQUE: Contiguous axial images were obtained from the base of the skull through the vertex without intravenous contrast. COMPARISON:  09/13/2015 FINDINGS: Brain: No evidence of acute infarction, hemorrhage, hydrocephalus, extra-axial collection or mass lesion/mass effect. Vascular: No hyperdense vessel or unexpected calcification. Skull: No acute or aggressive finding. Small right frontal osteoma along the inner table. Sinuses/Orbits: No acute finding. Other: Text page with results sent at the time of interpretation on 05/16/2016 at 1:23 pm to Dr. Orlena Sheldon. ASPECTS [facility] Stroke Program Early CT Score) - Ganglionic level infarction (caudate, lentiform nuclei, internal capsule, insula, M1-M3 cortex): 7 - Supraganglionic infarction (M4-M6  cortex): 3 Total score (0-10 with 10 being normal): 10 IMPRESSION: No acute finding.ASPECTS is 10. Electronically Signed   By: Monte Fantasia M.D.   On: 05/16/2016 13:24    ROS Blood pressure 131/63, pulse 83, resp. rate 15, height 5\' 7"  (1.702 m), weight 108.2 kg (238 lb 9 oz), SpO2 100 %. Neurologic Examination:  Awake, alert, fully oriented. Face symmetric. Tongue midline.  EOMI.  PERL.   Strength 5/5 bil UE and LE.   No pronator drift.   Decreased pinprick in the left face V1V2V3 and left arm.  Normal pinprick in the left leg. No babinski.  No hoffman's. Reflexes +1 bil. Coord- intact FTN, HTS.  NIHSS 1  Assessment/Plan:  Possible subcortical TIA  or ischemic infarct.  MRI Brain to assess.  Symptoms too mild to warrant IV tPA.  Recommend admit to hospital for CDUS, TTE, and telemetry.  If infarct found on MRI, then may consider changing Plavix to Aggrenox.  If not, continue Plavix. Her BP is good.  Her DM may need to be better controlled.  Check FLP and adjust Pravachol accordingly.    Rogue Jury, MD 05/16/2016, 1:37 PM

## 2016-05-16 NOTE — ED Notes (Signed)
Neurologist at bedside performing assessment.

## 2016-05-17 ENCOUNTER — Observation Stay (HOSPITAL_BASED_OUTPATIENT_CLINIC_OR_DEPARTMENT_OTHER): Payer: BC Managed Care – PPO

## 2016-05-17 DIAGNOSIS — E1140 Type 2 diabetes mellitus with diabetic neuropathy, unspecified: Secondary | ICD-10-CM

## 2016-05-17 DIAGNOSIS — G459 Transient cerebral ischemic attack, unspecified: Secondary | ICD-10-CM | POA: Diagnosis not present

## 2016-05-17 DIAGNOSIS — I639 Cerebral infarction, unspecified: Secondary | ICD-10-CM

## 2016-05-17 DIAGNOSIS — E039 Hypothyroidism, unspecified: Secondary | ICD-10-CM

## 2016-05-17 DIAGNOSIS — I10 Essential (primary) hypertension: Secondary | ICD-10-CM

## 2016-05-17 DIAGNOSIS — R2990 Unspecified symptoms and signs involving the nervous system: Secondary | ICD-10-CM

## 2016-05-17 LAB — LIPID PANEL
Cholesterol: 122 mg/dL (ref 0–200)
HDL: 35 mg/dL — ABNORMAL LOW (ref >40)
LDL CALC: 47 mg/dL (ref 0–99)
Total CHOL/HDL Ratio: 3.5 RATIO
Triglycerides: 198 mg/dL — ABNORMAL HIGH (ref <150)
VLDL: 40 mg/dL (ref 0–40)

## 2016-05-17 LAB — GLUCOSE, CAPILLARY
GLUCOSE-CAPILLARY: 147 mg/dL — AB (ref 65–99)
GLUCOSE-CAPILLARY: 159 mg/dL — AB (ref 65–99)
GLUCOSE-CAPILLARY: 220 mg/dL — AB (ref 65–99)

## 2016-05-17 LAB — ECHOCARDIOGRAM COMPLETE
HEIGHTINCHES: 67 in
WEIGHTICAEL: 3817 [oz_av]

## 2016-05-17 LAB — HIV ANTIBODY (ROUTINE TESTING W REFLEX): HIV SCREEN 4TH GENERATION: NONREACTIVE

## 2016-05-17 MED ORDER — CALCIUM CARBONATE-VITAMIN D 500-200 MG-UNIT PO TABS: 2.0000 | ORAL_TABLET | Freq: Three times a day (TID) | ORAL | Status: DC

## 2016-05-17 MED ORDER — FUROSEMIDE 40 MG PO TABS
80.0000 mg | ORAL_TABLET | Freq: Two times a day (BID) | ORAL | Status: DC
Start: 2016-05-17 — End: 2021-04-15

## 2016-05-17 NOTE — Progress Notes (Signed)
Echocardiogram 2D Echocardiogram has been performed.  [name] 05/17/2016, 11:29 AM

## 2016-05-17 NOTE — Discharge Summary (Signed)
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  Recommend annual imaging followup by CTA or MRA.    Discharge Diagnosis:   Principal Problem:   Stroke-like symptoms Active Problems:   Type 2 diabetes mellitus ([place])   Hypertension   Hypercholesteremia   Thoracic aortic aneurysm (HCC)   Hypothyroid   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   Fatty liver disease, nonalcoholic   Discharge disposition:  Home.  Discharge Condition: Improved.  Diet recommendation: Low sodium, heart healthy.  Carbohydrate-modified.  Wound care: None.   History of Present Illness:   Skylah Marston is a 56 y.o. female with a Past Medical History significant for diabetes, hypertension, low thyroid who presents with left arm numbness and left facial numbness. MRA and MRI negative for acute stroke. On my exam patient has equal strength in her upper extremites.    Hospital Course by Problem:   Suspected TIA v complicated migraine -MRI negative -LDL< 46 HgbAC1 pending for PCP To follow up -ambulatory neurology referral made -echo: Left ventricle: The cavity size was normal. Wall thickness was   increased in a pattern of mild LVH. Systolic function was normal.   The estimated ejection fraction was in the range of 60% to 65%.   Left ventricular diastolic function parameters were normal. - Left atrium: The atrium was mildly dilated. - Atrial septum: No defect or patent foramen ovale was identified. -carotids: No significant (1-39%) ICA stenosis. Antegrade vertebral flow  -seen by neuro-- continue plavix    Medical Consultants:    Neuro   Discharge Exam:   Vitals:   05/17/16 0658 05/17/16 0958  BP: (!) 100/55 119/61  Pulse: 73 75  Resp: 18 18  Temp: 98.2 F (36.8 C) 98 F (36.7 C)   Vitals:   05/17/16 0300 05/17/16 0500 05/17/16 0658 05/17/16 0958  BP: (!) 104/51 (!) 114/56 (!) 100/55 119/61  Pulse: 66 70 73 75  Resp: 18 18 18 18   Temp: 98.2 F (36.8 C) 98 F (36.7 C) 98.2 F (36.8 C) 98 F (36.7 C)  TempSrc: Oral Oral Oral Oral  SpO2: 97% 98% 97% 100%  Weight:      Height:        Gen:  Anxious about her multiple medical issues-- trying to get disability long-term currently    The results of significant diagnostics from this hospitalization (including imaging, microbiology, ancillary and laboratory) are listed below for reference.     Procedures and Diagnostic Studies:   Mr Virgel Paling [id number] Contrast  Result Date: 05/16/2016 CLINICAL DATA:  56 year old female code stroke, left arm weakness and left facial numbness beginning 1100 hours. Initial encounter. EXAM: MRI HEAD WITHOUT CONTRAST MRA HEAD WITHOUT CONTRAST TECHNIQUE: Multiplanar, multiecho pulse sequences of the brain and surrounding structures were obtained without intravenous contrast. Angiographic images of the head were obtained using MRA technique without contrast. COMPARISON:  Head CT without contrast 1311 hours. Brain MRI and intracranial MRA 09/14/2015 FINDINGS: MRI HEAD FINDINGS Brain: No restricted diffusion to suggest acute infarction. No midline shift, mass effect, evidence of mass lesion, ventriculomegaly, extra-axial collection or acute intracranial hemorrhage. Cervicomedullary junction and pituitary are within normal limits. Stable gray-white matter differentiation throughout the brain. Mild for age nonspecific cerebral white matter T2 and FLAIR hyperintensity  mostly in the right frontal lobe (series 8, image 18). No cortical encephalomalacia. No chronic cerebral blood products. Vascular: Major intracranial vascular flow voids are stable since 2017 and appear normal. Skull and  upper cervical spine: Negative. Normal bone marrow signal. Sinuses/Orbits: Stable, negative. Other: Visible internal auditory structures appear normal. Negative scalp soft tissues. MRA HEAD FINDINGS Codominant distal vertebral arteries with antegrade flow in the posterior circulation. No distal vertebral stenosis. Normal PICA origins and vertebrobasilar junction. No basilar stenosis. Normal SCA and PCA origins. Small posterior communicating arteries. Normal PCA branches. Antegrade flow in both ICA siphons. Mild siphon irregularity in keeping with atherosclerosis but no siphon stenosis. Ophthalmic and posterior communicating artery origins are normal. Normal carotid termini, MCA and ACA origins. Anterior communicating artery and visible ACA branches are within normal limits. MCA M1 segments, MCA bifurcations, and visualized MCA branches are within normal limits. IMPRESSION: 1. No acute intracranial abnormality. Stable since 2017 and largely unremarkable for age noncontrast MRI appearance of the brain. 2. Stable and negative intracranial MRA. Electronically Signed   By: Genevie Ann M.D.   On: 05/16/2016 16:48   Mr Brain Wo Contrast  Result Date: 05/16/2016 CLINICAL DATA:  56 year old female code stroke, left arm weakness and left facial numbness beginning 1100 hours. Initial encounter. EXAM: MRI HEAD WITHOUT CONTRAST MRA HEAD WITHOUT CONTRAST TECHNIQUE: Multiplanar, multiecho pulse sequences of the brain and surrounding structures were obtained without intravenous contrast. Angiographic images of the head were obtained using MRA technique without contrast. COMPARISON:  Head CT without contrast 1311 hours. Brain MRI and intracranial MRA 09/14/2015 FINDINGS: MRI HEAD FINDINGS Brain: No restricted diffusion to suggest acute infarction. No midline shift, mass effect, evidence of mass lesion, ventriculomegaly, extra-axial collection or acute intracranial hemorrhage. Cervicomedullary junction and pituitary are within normal  limits. Stable gray-white matter differentiation throughout the brain. Mild for age nonspecific cerebral white matter T2 and FLAIR hyperintensity mostly in the right frontal lobe (series 8, image 18). No cortical encephalomalacia. No chronic cerebral blood products. Vascular: Major intracranial vascular flow voids are stable since 2017 and appear normal. Skull and upper cervical spine: Negative. Normal bone marrow signal. Sinuses/Orbits: Stable, negative. Other: Visible internal auditory structures appear normal. Negative scalp soft tissues. MRA HEAD FINDINGS Codominant distal vertebral arteries with antegrade flow in the posterior circulation. No distal vertebral stenosis. Normal PICA origins and vertebrobasilar junction. No basilar stenosis. Normal SCA and PCA origins. Small posterior communicating arteries. Normal PCA branches. Antegrade flow in both ICA siphons. Mild siphon irregularity in keeping with atherosclerosis but no siphon stenosis. Ophthalmic and posterior communicating artery origins are normal. Normal carotid termini, MCA and ACA origins. Anterior communicating artery and visible ACA branches are within normal limits. MCA M1 segments, MCA bifurcations, and visualized MCA branches are within normal limits. IMPRESSION: 1. No acute intracranial abnormality. Stable since 2017 and largely unremarkable for age noncontrast MRI appearance of the brain. 2. Stable and negative intracranial MRA. Electronically Signed   By: Genevie Ann M.D.   On: 05/16/2016 16:48   Ct Head Code Stroke W/o Cm  Result Date: 05/16/2016 CLINICAL DATA:  Code stroke. Left arm weakness. Left-sided facial numbness beginning today 11. EXAM: CT HEAD WITHOUT CONTRAST TECHNIQUE: Contiguous axial images were obtained from the base of the skull through the vertex without intravenous contrast. COMPARISON:  09/13/2015 FINDINGS: Brain: No evidence of acute infarction, hemorrhage, hydrocephalus, extra-axial collection or mass lesion/mass effect.  Vascular: No hyperdense vessel or unexpected calcification. Skull: No acute or aggressive finding. Small right frontal  osteoma along the inner table. Sinuses/Orbits: No acute finding. Other: Text page with results sent at the time of interpretation on 05/16/2016 at 1:23 pm to Dr. Orlena Sheldon. ASPECTS [facility] Stroke Program Early CT Score) - Ganglionic level infarction (caudate, lentiform nuclei, internal capsule, insula, M1-M3 cortex): 7 - Supraganglionic infarction (M4-M6 cortex): 3 Total score (0-10 with 10 being normal): 10 IMPRESSION: No acute finding.ASPECTS is 10. Electronically Signed   By: Monte Fantasia M.D.   On: 05/16/2016 13:24     Labs:   Basic Metabolic Panel:  Recent Labs Lab 05/16/16 1330 05/16/16 1343  NA 138 141  K 3.6 3.6  CL 104 105  CO2 23  --   GLUCOSE 182* 178*  BUN 12 14  CREATININE 1.00 0.90  CALCIUM 8.0*  --    GFR Estimated Creatinine Clearance: 90.5 mL/min (by C-G formula based on SCr of 0.9 mg/dL). Liver Function Tests:  Recent Labs Lab 05/16/16 1330  AST 20  ALT 24  ALKPHOS 57  BILITOT 0.7  PROT 7.1  ALBUMIN 3.8   No results for input(s): LIPASE, AMYLASE in the last 168 hours. No results for input(s): AMMONIA in the last 168 hours. Coagulation profile  Recent Labs Lab 05/16/16 1330  INR 1.03    CBC:  Recent Labs Lab 05/16/16 1330 05/16/16 1343  WBC 6.8  --   NEUTROABS 4.2  --   HGB 12.9 12.6  HCT 38.4 37.0  MCV 86.7  --   PLT 235  --    Cardiac Enzymes: No results for input(s): CKTOTAL, CKMB, CKMBINDEX, TROPONINI in the last 168 hours. BNP: Invalid input(s): POCBNP CBG:  Recent Labs Lab 05/16/16 2111 05/16/16 2314 05/17/16 0410 05/17/16 0735 05/17/16 1155  GLUCAP 174* 160* 147* 159* 220*   D-Dimer No results for input(s): DDIMER in the last 72 hours. Hgb A1c No results for input(s): HGBA1C in the last 72 hours. Lipid Profile  Recent Labs  05/17/16 0318  CHOL 122  HDL 35*  LDLCALC 47  TRIG 198*  CHOLHDL  3.5   Thyroid function studies No results for input(s): TSH, T4TOTAL, T3FREE, THYROIDAB in the last 72 hours.  Invalid input(s): FREET3 Anemia work up No results for input(s): VITAMINB12, FOLATE, FERRITIN, TIBC, IRON, RETICCTPCT in the last 72 hours. Microbiology No results found for this or any previous visit (from the past 240 hour(s)).   Discharge Instructions:   Discharge Instructions    Ambulatory referral to Neurology    Complete by:  As directed    An appointment is requested in approximately: 6 weeks re: headache   Diet - low sodium heart healthy    Complete by:  As directed    Diet Carb Modified    Complete by:  As directed    Increase activity slowly    Complete by:  As directed      Allergies as of 05/17/2016      Reactions   Lorabid [loracarbef] Anaphylaxis   Augmentin [amoxicillin-pot Clavulanate] Hives, Nausea And Vomiting   Chest pain   Codeine Nausea And Vomiting   Sulfa Drugs Cross Reactors Other (See Comments)   Severe allergic reaction as a child - was not told symptoms   Benicar [olmesartan Medoxomil] Swelling, Rash   Other Rash   Green peas      Medication List    TAKE these medications   acetaminophen 500 MG tablet Commonly known as:  TYLENOL Take 1,000 mg by mouth every 4 (four) hours as needed (body aches).  albuterol 108 (90 Base) MCG/ACT inhaler Commonly known as:  PROVENTIL HFA;VENTOLIN HFA Inhale 2 puffs into the lungs every 4 (four) hours as needed for wheezing or shortness of breath.   calcium-vitamin D 500-200 MG-UNIT tablet Commonly known as:  OSCAL WITH D Take 2 tablets by mouth 3 (three) times daily.   clopidogrel 75 MG tablet Commonly known as:  PLAVIX Take 1 tablet (75 mg total) by mouth daily.   diphenhydrAMINE 25 MG tablet Commonly known as:  BENADRYL Take 25 mg by mouth at bedtime.   FISH OIL PO Take 1 capsule by mouth daily.   furosemide 40 MG tablet Commonly known as:  LASIX Take 2 tablets (80 mg total) by  mouth 2 (two) times daily.   lansoprazole 15 MG capsule Commonly known as:  PREVACID Take 1 capsule (15 mg total) by mouth daily at 12 noon. What changed:  when to take this   levothyroxine 300 MCG tablet Commonly known as:  SYNTHROID, LEVOTHROID Take 300 mcg by mouth daily before breakfast.   lisinopril 10 MG tablet Commonly known as:  PRINIVIL,ZESTRIL Take 10 mg by mouth daily.   metFORMIN 1000 MG tablet Commonly known as:  GLUCOPHAGE Take 1 tablet (1,000 mg total) by mouth 2 (two) times daily with a meal.   metoprolol 50 MG tablet Commonly known as:  LOPRESSOR Take 50 mg by mouth 2 (two) times daily.   ondansetron 8 MG disintegrating tablet Commonly known as:  ZOFRAN-ODT Take 8 mg by mouth 3 (three) times daily as needed for nausea or vomiting.   potassium chloride SA 20 MEQ tablet Commonly known as:  K-DUR,KLOR-CON Take 40 mEq by mouth 2 (two) times daily.   pravastatin 20 MG tablet Commonly known as:  PRAVACHOL Take 20 mg by mouth daily.   PROBIOTIC PO Take 1 tablet by mouth daily.   promethazine 25 MG tablet Commonly known as:  PHENERGAN Take 1 tablet (25 mg total) by mouth every 6 (six) hours as needed for nausea or vomiting.   sitaGLIPtin 100 MG tablet Commonly known as:  JANUVIA Take 100 mg by mouth daily.      Follow-up Information    REESE,BETTI D, MD Follow up in 1 week(s).   Specialty:  Family Medicine Contact information: [id number] [place] [address] [phone]            Time coordinating discharge: 35 min  Signed:  Herma Uballe U Toi Stelly   [facility] 05/17/2016, 12:56 PM

## 2016-05-17 NOTE — Progress Notes (Signed)
Discharge orders received.  Discharge instructions and follow-up appointments reviewed with the patient and her daughter.  VSS upon discharge.  IV removed and education complete.  Transported out via wheelchair.   Ramez Arrona M, RN 

## 2016-05-17 NOTE — Care Management Note (Signed)
Case Management Note  Patient Details  Name: [name] MRN: [id number] Date of Birth: 1960/07/30  Subjective/Objective:        Patient presented with Left facial and left arm numbness.  Lives at home with spouse.  CM will follow for discharge needs pending PT/OT evals and physician orders.             Action/Plan:   Expected Discharge Date:                  Expected Discharge Plan:     In-House Referral:     Discharge planning Services     Post Acute Care Choice:    Choice offered to:     DME Arranged:    DME Agency:     HH Arranged:    HH Agency:     Status of Service:     If discussed at [employer] of Stay Meetings, dates discussed:    Additional Comments:  Rolm Baptise, RN 05/17/2016, 9:27 AM

## 2016-05-17 NOTE — Progress Notes (Signed)
*  PRELIMINARY RESULTS* Vascular Ultrasound Carotid Duplex (Doppler) has been completed.  Preliminary findings: Bilateral: No significant (1-39%) ICA stenosis. Antegrade vertebral flow.   Landry Mellow, RDMS, RVT  05/17/2016, 11:36 AM

## 2016-05-17 NOTE — Care Management Note (Signed)
Case Management Note  Patient Details  Name: [name] MRN: [id number] Date of Birth: 1960/04/25  Subjective/Objective:                    Action/Plan: CM consulted d/t financial concerns from the patient. She and her husband are on a very tight income and she is concerned about paying the hospital bill at d/c. CM encouraged her to call financial counseling once she has received the bill after it is run through her insurance and work out a Agricultural consultant.  She states that currently the cost of her medications are manageable. She does state that the copay at her specialist office is often about $100. CM encouraged her to discuss her situation with the specialist office and/or speak to her church about assisting with copays. Pt voiced understanding. CM also encouraged her to see if she will qualify for Medicaid.  Pt does have transportation home.  Expected Discharge Date:  05/17/16               Expected Discharge Plan:  Home/Self Care  In-House Referral:     Discharge planning Services  CM Consult  Post Acute Care Choice:    Choice offered to:     DME Arranged:    DME Agency:     HH Arranged:    HH Agency:     Status of Service:  Completed, signed off  If discussed at [employer] of Stay Meetings, dates discussed:    Additional Comments:  Denise Friar, RN 05/17/2016, 4:02 PM

## 2016-05-17 NOTE — Evaluation (Signed)
Occupational Therapy Evaluation Patient Details Name: [name] MRN: [id number] DOB: 05-11-1960 Today's Date: 05/17/2016    History of Present Illness 56 yo female with L facial and arm weakness now resolved with negative CT/ MRI  Past Medical History:  Diagnosis Date  . Anemia    hx of   . 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 Asthma    due to allergies   . Cancer ([place])   . Complication of anesthesia   . Diabetes mellitus   . Dysrhythmia    " irregular heart rate per patient"  . Family history of adverse reaction to anesthesia    difficult for father to wake after surgery  . Heart murmur   . Hypercholesteremia   . Hypertension   . PONV (postoperative nausea and vomiting)   . Thyroid disease   . TIA (transient ischemic attack)       Clinical Impression   Patient evaluated by Occupational Therapy with no further acute OT needs identified. All education has been completed and the patient has no further questions. See below for any follow-up Occupational Therapy or equipment needs. OT to sign off. Thank you for referral.      Follow Up Recommendations  No OT follow up    Equipment Recommendations  None recommended by OT    Recommendations for Other Services       Precautions / Restrictions Precautions Precautions: None      Mobility Bed Mobility Overal bed mobility: Independent                Transfers Overall transfer level: Independent                    Balance                                            ADL Overall ADL's : Independent                                       General ADL Comments: completed full adl at sink level and currently at baseline     Vision Baseline Vision/History: No visual deficits       Perception     Praxis      Pertinent Vitals/Pain Pain Assessment: No/denies pain     Hand Dominance Left   Extremity/Trunk Assessment Upper Extremity Assessment Upper  Extremity Assessment: Overall WFL for tasks assessed   Lower Extremity Assessment Lower Extremity Assessment: Overall WFL for tasks assessed   Cervical / Trunk Assessment Cervical / Trunk Assessment: Normal   Communication Communication Communication: No difficulties   Cognition Arousal/Alertness: Awake/alert Behavior During Therapy: WFL for tasks assessed/performed Overall Cognitive Status: Within Functional Limits for tasks assessed                     General Comments       Exercises       Shoulder Instructions      Home Living Family/patient expects to be discharged to:: Private residence Living Arrangements: Spouse/significant other Available Help at Discharge: Family Type of Home: House Home Access: Stairs to enter Technical brewer of Steps: 2 Entrance Stairs-Rails: None Home Layout: One level     Bathroom Shower/Tub: Teacher, early years/pre:  Standard     Home Equipment: Cane - single point          Prior Functioning/Environment Level of Independence: Independent                 OT Problem List:        OT Treatment/Interventions:      OT Goals(Current goals can be found in the care plan section) Acute Rehab OT Goals Patient Stated Goal: to return home asap OT Goal Formulation: With patient Time For Goal Achievement: 05/31/16 Potential to Achieve Goals: Good  OT Frequency:     Barriers to D/C:            Co-evaluation              End of Session Nurse Communication: Mobility status;Precautions  Activity Tolerance: Patient tolerated treatment well Patient left: in bed;with call bell/phone within reach  OT Visit Diagnosis: Muscle weakness (generalized) (M62.81)                ADL either performed or assessed with clinical judgement  Time: [id number] OT Time Calculation (min): 18 min Charges:  OT General Charges $OT Visit: 1 Procedure OT Evaluation $OT Eval Low Complexity: 1 Procedure G-Codes: OT  G-codes **NOT FOR INPATIENT CLASS** Functional Assessment Tool Used: AM-PAC 6 Clicks Daily Activity Functional Limitation: Self care Self Care Current Status [id number]): 0 percent impaired, limited or restricted Self Care Goal Status [id number]): 0 percent impaired, limited or restricted Self Care Discharge Status [id number]): 0 percent impaired, limited or restricted     Parke Poisson B 05/17/2016, 10:47 AM   Jeri Modena   OTR/L PagerIP:[phone] Office: [phone] .

## 2016-05-17 NOTE — Progress Notes (Signed)
PT Cancellation and Discharge Note  Patient Details Name: Jimin Stine MRN: [id number] DOB: 30-Mar-1960   Cancelled Treatment:    Reason Eval/Treat Not Completed: PT screened, no needs identified, will sign off.  Spoke with OT who indicates pt has returned to baseline and has no deficits at this time.  Will sign off.     Thornton Papas Reegan Mctighe 05/17/2016, 10:50 AM

## 2016-05-18 LAB — HEMOGLOBIN A1C
Hgb A1c MFr Bld: 6.9 % — ABNORMAL HIGH (ref 4.8–5.6)
MEAN PLASMA GLUCOSE: 151 mg/dL

## 2016-05-19 LAB — VAS US CAROTID
LEFT ECA DIAS: -16 cm/s
LEFT VERTEBRAL DIAS: 16 cm/s
LICADDIAS: -26 cm/s
LICAPDIAS: -25 cm/s
LICAPSYS: -71 cm/s
Left CCA dist dias: -25 cm/s
Left CCA dist sys: -91 cm/s
Left CCA prox dias: 26 cm/s
Left CCA prox sys: 111 cm/s
Left ICA dist sys: -70 cm/s
RCCAPDIAS: 8 cm/s
RIGHT ECA DIAS: -13 cm/s
RIGHT VERTEBRAL DIAS: 16 cm/s
Right CCA prox sys: 58 cm/s
Right cca dist sys: -74 cm/s

## 2016-06-23 ENCOUNTER — Encounter (HOSPITAL_COMMUNITY): Payer: Self-pay | Admitting: Emergency Medicine

## 2016-06-23 ENCOUNTER — Emergency Department (HOSPITAL_COMMUNITY)
Admission: EM | Admit: 2016-06-23 | Discharge: 2016-06-23 | Disposition: A | Discharge status: Home / Self Care | Payer: BC Managed Care – PPO | Attending: Emergency Medicine | Admitting: Emergency Medicine

## 2016-06-23 DIAGNOSIS — Z8673 Personal history of transient ischemic attack (TIA), and cerebral infarction without residual deficits: Secondary | ICD-10-CM | POA: Insufficient documentation

## 2016-06-23 DIAGNOSIS — Z7902 Long term (current) use of antithrombotics/antiplatelets: Secondary | ICD-10-CM | POA: Insufficient documentation

## 2016-06-23 DIAGNOSIS — Z7984 Long term (current) use of oral hypoglycemic drugs: Secondary | ICD-10-CM | POA: Insufficient documentation

## 2016-06-23 DIAGNOSIS — N3000 Acute cystitis without hematuria: Secondary | ICD-10-CM

## 2016-06-23 DIAGNOSIS — E119 Type 2 diabetes mellitus without complications: Secondary | ICD-10-CM | POA: Diagnosis not present

## 2016-06-23 DIAGNOSIS — Z79899 Other long term (current) drug therapy: Secondary | ICD-10-CM | POA: Diagnosis not present

## 2016-06-23 DIAGNOSIS — J45909 Unspecified asthma, uncomplicated: Secondary | ICD-10-CM | POA: Insufficient documentation

## 2016-06-23 DIAGNOSIS — I2510 Atherosclerotic heart disease of native coronary artery without angina pectoris: Secondary | ICD-10-CM | POA: Insufficient documentation

## 2016-06-23 DIAGNOSIS — E039 Hypothyroidism, unspecified: Secondary | ICD-10-CM | POA: Insufficient documentation

## 2016-06-23 DIAGNOSIS — I10 Essential (primary) hypertension: Secondary | ICD-10-CM | POA: Diagnosis not present

## 2016-06-23 DIAGNOSIS — N390 Urinary tract infection, site not specified: Secondary | ICD-10-CM | POA: Diagnosis present

## 2016-06-23 LAB — URINALYSIS, ROUTINE W REFLEX MICROSCOPIC
BILIRUBIN URINE: NEGATIVE
Glucose, UA: 150 mg/dL — AB
Hgb urine dipstick: NEGATIVE
KETONES UR: NEGATIVE mg/dL
Nitrite: POSITIVE — AB
PH: 6 (ref 5.0–8.0)
Protein, ur: NEGATIVE mg/dL
SPECIFIC GRAVITY, URINE: 1.011 (ref 1.005–1.030)

## 2016-06-23 LAB — BASIC METABOLIC PANEL
ANION GAP: 11 (ref 5–15)
BUN: 17 mg/dL (ref 6–20)
CO2: 26 mmol/L (ref 22–32)
Calcium: 8.3 mg/dL — ABNORMAL LOW (ref 8.9–10.3)
Chloride: 101 mmol/L (ref 101–111)
Creatinine, Ser: 0.92 mg/dL (ref 0.44–1.00)
GFR calc Af Amer: >60 mL/min (ref >60)
Glucose, Bld: 281 mg/dL — ABNORMAL HIGH (ref 65–99)
POTASSIUM: 4.2 mmol/L (ref 3.5–5.1)
SODIUM: 138 mmol/L (ref 135–145)

## 2016-06-23 LAB — CBC WITH DIFFERENTIAL/PLATELET
BASOS ABS: 0 10*3/uL (ref 0.0–0.1)
Basophils Relative: 0 %
EOS ABS: 0.2 10*3/uL (ref 0.0–0.7)
EOS PCT: 2 %
HCT: 37.4 % (ref 36.0–46.0)
Hemoglobin: 12 g/dL (ref 12.0–15.0)
LYMPHS PCT: 16 %
Lymphs Abs: 1.1 10*3/uL (ref 0.7–4.0)
MCH: 28 pg (ref 26.0–34.0)
MCHC: 32.1 g/dL (ref 30.0–36.0)
MCV: 87.4 fL (ref 78.0–100.0)
Monocytes Absolute: 0.3 10*3/uL (ref 0.1–1.0)
Monocytes Relative: 4 %
Neutro Abs: 5.5 10*3/uL (ref 1.7–7.7)
Neutrophils Relative %: 78 %
PLATELETS: 222 10*3/uL (ref 150–400)
RBC: 4.28 MIL/uL (ref 3.87–5.11)
RDW: 13.5 % (ref 11.5–15.5)
WBC: 7 10*3/uL (ref 4.0–10.5)

## 2016-06-23 MED ORDER — CIPROFLOXACIN HCL 500 MG PO TABS: 500.0000 mg | ORAL_TABLET | Freq: Two times a day (BID) | ORAL | 0 refills | Status: DC

## 2016-06-23 MED ORDER — CIPROFLOXACIN HCL 500 MG PO TABS
500.0000 mg | ORAL_TABLET | Freq: Once | ORAL | Status: AC
  Administered 2016-06-23: 500 mg via ORAL
  Filled 2016-06-23: qty 1

## 2016-06-23 NOTE — ED Notes (Signed)
MD Zammit at the bedside.  

## 2016-06-23 NOTE — Discharge Instructions (Signed)
Drink plenty of fluids and follow-up with her doctor in a week for recheck

## 2016-06-23 NOTE — ED Notes (Signed)
Phlebotomy at the bedside  

## 2016-06-23 NOTE — ED Triage Notes (Addendum)
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 PT has been having nausea ongoing and has been self medicating with zofran. Pt received a call on Friday and states her urine tested positive with UTI with ecoli. Pt has frequent UTI. PT has generalized ABD pain. Pt is not currently on antibiotics. 9/10 pain.

## 2016-06-23 NOTE — ED Provider Notes (Signed)
[facility] DEPT Provider Note   CSN: [id number] Arrival date & time: 06/23/16  3382     History   Chief Complaint Chief Complaint  Patient presents with  . Urinary Tract Infection  . Emesis  . Abdominal Pain    HPI [name] is a 56 y.o. female.  Patient complains of suprapubic abdominal pain and dysuria no fever no chills   The history is provided by the patient.  Urinary Tract Infection   This is a new problem. The problem occurs every urination. The problem has not changed since onset.The quality of the pain is described as burning. The pain is at a severity of 6/10. There has been no fever. She is not sexually active. Associated symptoms include vomiting. Pertinent negatives include no frequency and no hematuria. Her past medical history does not include kidney stones.  Emesis   Associated symptoms include abdominal pain. Pertinent negatives include no cough, no diarrhea and no headaches.  Abdominal Pain   Associated symptoms include vomiting and dysuria. Pertinent negatives include diarrhea, frequency, hematuria and headaches.    Past Medical History:  Diagnosis Date  . Anemia    hx of   . 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 Asthma    due to allergies   . Cancer ([place])   . Complication of anesthesia   . Diabetes mellitus   . Dysrhythmia    " irregular heart rate per patient"  . Family history of adverse reaction to anesthesia    difficult for father to wake after surgery  . Heart murmur   . Hypercholesteremia   . Hypertension   . PONV (postoperative nausea and vomiting)   . Thyroid disease   . TIA (transient ischemic attack)     Patient Active Problem List   Diagnosis Date Noted  . Stroke-like symptoms 05/16/2016  . 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 Fatty liver disease, nonalcoholic 50/53/9767  . Slurred speech 09/13/2015  . Parotitis 09/13/2015  . Gastroenteritis 08/21/2015  . Hypothyroid  08/21/2015  . 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 Pain in the chest   . Essential hypertension   . Thoracic aortic aneurysm ([place])   . Chest pain 04/23/2014  . Anginal pain ([place]) 04/23/2014  . Type 2 diabetes mellitus ([place])   . Hypertension   . Hypercholesteremia     Past Surgical History:  Procedure Laterality Date  . ABDOMINAL HYSTERECTOMY    . CHOLECYSTECTOMY    . COLON SURGERY    . KNEE ARTHROSCOPY    . LEFT HEART CATHETERIZATION WITH CORONARY ANGIOGRAM N/A 04/24/2014   Procedure: LEFT HEART CATHETERIZATION WITH CORONARY ANGIOGRAM;  Surgeon: Burnell Blanks, MD;  [facility] CATH LAB;  Service: Cardiovascular;  Laterality: N/A;  . THYROIDECTOMY N/A 09/20/2014   Procedure: TOTAL THYROIDECTOMY;  Surgeon: Armandina Gemma, MD;  Location: [facility] ORS;  Service: General;  Laterality: N/A;  . TONSILLECTOMY      OB History    No data available       Home Medications    Prior to Admission medications   Medication Sig Start Date End Date Taking? Authorizing Provider  acetaminophen (TYLENOL) 500 MG tablet Take 1,000 mg by mouth every 4 (four) hours as needed (body aches).    Historical Provider, MD  albuterol (PROVENTIL HFA;VENTOLIN HFA) 108 (90 BASE) MCG/ACT inhaler Inhale 2 puffs into the lungs every 4 (four) hours as needed for wheezing or shortness of breath.  09/07/13   Leeanne Rio, MD  calcium-vitamin D (OSCAL WITH D) 500-200 MG-UNIT tablet Take 2 tablets by mouth 3 (three) times daily. 05/17/16   Geradine Girt, DO  ciprofloxacin (CIPRO) 500 MG tablet Take 1 tablet (500 mg total) by mouth 2 (two) times daily. One po bid x 7 days 06/23/16   Milton Ferguson, MD  clopidogrel (PLAVIX) 75 MG tablet Take 1 tablet (75 mg total) by mouth daily. 09/15/15   Florencia Reasons, MD  diphenhydrAMINE (BENADRYL) 25 MG tablet Take 25 mg by mouth at bedtime.    Historical Provider, MD  furosemide (LASIX) 40 MG tablet Take 2 tablets (80 mg total) by mouth 2 (two) times daily.  05/17/16   Geradine Girt, DO  lansoprazole (PREVACID) 15 MG capsule Take 1 capsule (15 mg total) by mouth daily at 12 noon. Patient taking differently: Take 15 mg by mouth daily.  03/07/15   Sharlett Iles, MD  levothyroxine (SYNTHROID, LEVOTHROID) 300 MCG tablet Take 300 mcg by mouth daily before breakfast.    Historical Provider, MD  lisinopril (PRINIVIL,ZESTRIL) 10 MG tablet Take 10 mg by mouth daily.  09/09/14   Historical Provider, MD  metFORMIN (GLUCOPHAGE) 1000 MG tablet Take 1 tablet (1,000 mg total) by mouth 2 (two) times daily with a meal. 04/29/14   Maryann Mikhail, DO  metoprolol (LOPRESSOR) 50 MG tablet Take 50 mg by mouth 2 (two) times daily.    Historical Provider, MD  Omega-3 Fatty Acids (FISH OIL PO) Take 1 capsule by mouth daily.    Historical Provider, MD  ondansetron (ZOFRAN-ODT) 8 MG disintegrating tablet Take 8 mg by mouth 3 (three) times daily as needed for nausea or vomiting.    Historical Provider, MD  potassium chloride SA (K-DUR,KLOR-CON) 20 MEQ tablet Take 40 mEq by mouth 2 (two) times daily.     Historical Provider, MD  pravastatin (PRAVACHOL) 20 MG tablet Take 20 mg by mouth daily.  06/25/13   Historical Provider, MD  Probiotic Product (PROBIOTIC PO) Take 1 tablet by mouth daily.    Historical Provider, MD  promethazine (PHENERGAN) 25 MG tablet Take 1 tablet (25 mg total) by mouth every 6 (six) hours as needed for nausea or vomiting. 10/11/15   Virgel Manifold, MD  sitaGLIPtin (JANUVIA) 100 MG tablet Take 100 mg by mouth daily.    Historical Provider, MD    Family History Family History  Problem Relation Age of Onset  . Heart failure Mother   . Diabetes Mother   . Stroke Father   . Heart failure Father   . Cancer Father     Social History Social History  Substance Use Topics  . Smoking status: Never Smoker  . Smokeless tobacco: Never Used  . Alcohol use No     Allergies   Lorabid [loracarbef]; Augmentin [amoxicillin-pot clavulanate]; Codeine; Sulfa  drugs cross reactors; Benicar [olmesartan medoxomil]; and Other   Review of Systems Review of Systems  Constitutional: Negative for appetite change and fatigue.  HENT: Negative for congestion, ear discharge and sinus pressure.   Eyes: Negative for discharge.  Respiratory: Negative for cough.   Cardiovascular: Negative for chest pain.  Gastrointestinal: Positive for abdominal pain and vomiting. Negative for diarrhea.  Genitourinary: Positive for dysuria. Negative for frequency and hematuria.  Musculoskeletal: Negative for back pain.  Skin: Negative for rash.  Neurological: Negative for seizures and headaches.  Psychiatric/Behavioral: Negative for hallucinations.     Physical Exam Updated Vital Signs BP (!) 160/97   Pulse  71   Temp 97.8 F (36.6 C) (Oral)   Resp 18   SpO2 100%   Physical Exam  Constitutional: She is oriented to person, place, and time. She appears well-developed.  HENT:  Head: Normocephalic.  Eyes: Conjunctivae and EOM are normal. No scleral icterus.  Neck: Neck supple. No thyromegaly present.  Cardiovascular: Normal rate and regular rhythm.  Exam reveals no gallop and no friction rub.   No murmur heard. Pulmonary/Chest: No stridor. She has no wheezes. She has no rales. She exhibits no tenderness.  Abdominal: She exhibits no distension. There is tenderness. There is no rebound.  Musculoskeletal: Normal range of motion. She exhibits no edema.  Lymphadenopathy:    She has no cervical adenopathy.  Neurological: She is oriented to person, place, and time. She exhibits normal muscle tone. Coordination normal.  Skin: No rash noted. No erythema.  Psychiatric: She has a normal mood and affect. Her behavior is normal.     ED Treatments / Results  Labs (all labs ordered are listed, but only abnormal results are displayed) Labs Reviewed  BASIC METABOLIC PANEL - Abnormal; Notable for the following:       Result Value   Glucose, Bld 281 (*)    Calcium 8.3 (*)      All other components within normal limits  URINALYSIS, ROUTINE W REFLEX MICROSCOPIC - Abnormal; Notable for the following:    APPearance HAZY (*)    Glucose, UA 150 (*)    Nitrite POSITIVE (*)    Leukocytes, UA MODERATE (*)    Bacteria, UA MANY (*)    Squamous Epithelial / LPF 0-5 (*)    All other components within normal limits  URINE CULTURE  CBC WITH DIFFERENTIAL/PLATELET    EKG  EKG Interpretation None       Radiology No results found.  Procedures Procedures (including critical care time)  Medications Ordered in ED Medications  ciprofloxacin (CIPRO) tablet 500 mg (not administered)     Initial Impression / Assessment and Plan / ED Course  I have reviewed the triage vital signs and the nursing notes.  Pertinent labs & imaging results that were available during my care of the patient were reviewed by me and considered in my medical decision making (see chart for details).     uti,  tx with cipro  Final Clinical Impressions(s) / ED Diagnoses   Final diagnoses:  Acute cystitis without hematuria    New Prescriptions New Prescriptions   CIPROFLOXACIN (CIPRO) 500 MG TABLET    Take 1 tablet (500 mg total) by mouth 2 (two) times daily. One po bid x 7 days     Milton Ferguson, MD 06/23/16 [phone]

## 2016-06-25 LAB — URINE CULTURE: Culture: >=100000 — AB

## 2016-06-30 ENCOUNTER — Ambulatory Visit: Payer: Self-pay | Admitting: Diagnostic Neuroimaging

## 2016-07-06 ENCOUNTER — Encounter (HOSPITAL_COMMUNITY): Payer: Self-pay

## 2016-07-06 ENCOUNTER — Emergency Department (HOSPITAL_COMMUNITY)
Admission: EM | Admit: 2016-07-06 | Discharge: 2016-07-06 | Disposition: A | Discharge status: Home / Self Care | Payer: BC Managed Care – PPO | Attending: Emergency Medicine | Admitting: Emergency Medicine

## 2016-07-06 DIAGNOSIS — E119 Type 2 diabetes mellitus without complications: Secondary | ICD-10-CM | POA: Diagnosis not present

## 2016-07-06 DIAGNOSIS — I2510 Atherosclerotic heart disease of native coronary artery without angina pectoris: Secondary | ICD-10-CM | POA: Insufficient documentation

## 2016-07-06 DIAGNOSIS — E039 Hypothyroidism, unspecified: Secondary | ICD-10-CM | POA: Diagnosis not present

## 2016-07-06 DIAGNOSIS — I10 Essential (primary) hypertension: Secondary | ICD-10-CM | POA: Diagnosis not present

## 2016-07-06 DIAGNOSIS — N390 Urinary tract infection, site not specified: Secondary | ICD-10-CM | POA: Insufficient documentation

## 2016-07-06 DIAGNOSIS — R1030 Lower abdominal pain, unspecified: Secondary | ICD-10-CM | POA: Diagnosis present

## 2016-07-06 DIAGNOSIS — Z8673 Personal history of transient ischemic attack (TIA), and cerebral infarction without residual deficits: Secondary | ICD-10-CM | POA: Diagnosis not present

## 2016-07-06 DIAGNOSIS — J45909 Unspecified asthma, uncomplicated: Secondary | ICD-10-CM | POA: Diagnosis not present

## 2016-07-06 DIAGNOSIS — Z7984 Long term (current) use of oral hypoglycemic drugs: Secondary | ICD-10-CM | POA: Diagnosis not present

## 2016-07-06 LAB — COMPREHENSIVE METABOLIC PANEL
ALBUMIN: 3.6 g/dL (ref 3.5–5.0)
ALK PHOS: 62 U/L (ref 38–126)
ALT: 18 U/L (ref 14–54)
AST: 17 U/L (ref 15–41)
Anion gap: 11 (ref 5–15)
BUN: 11 mg/dL (ref 6–20)
CO2: 28 mmol/L (ref 22–32)
CREATININE: 0.88 mg/dL (ref 0.44–1.00)
Calcium: 8.7 mg/dL — ABNORMAL LOW (ref 8.9–10.3)
Chloride: 103 mmol/L (ref 101–111)
GFR calc Af Amer: >60 mL/min (ref >60)
GFR calc non Af Amer: >60 mL/min (ref >60)
GLUCOSE: 209 mg/dL — AB (ref 65–99)
Potassium: 4 mmol/L (ref 3.5–5.1)
SODIUM: 142 mmol/L (ref 135–145)
Total Bilirubin: 0.7 mg/dL (ref 0.3–1.2)
Total Protein: 7.1 g/dL (ref 6.5–8.1)

## 2016-07-06 LAB — URINALYSIS, ROUTINE W REFLEX MICROSCOPIC
BILIRUBIN URINE: NEGATIVE
Glucose, UA: NEGATIVE mg/dL
Hgb urine dipstick: NEGATIVE
KETONES UR: NEGATIVE mg/dL
Nitrite: NEGATIVE
PH: 5 (ref 5.0–8.0)
PROTEIN: NEGATIVE mg/dL
Specific Gravity, Urine: 1.005 (ref 1.005–1.030)
Squamous Epithelial / HPF: NONE SEEN

## 2016-07-06 LAB — CBC
HEMATOCRIT: 38.5 % (ref 36.0–46.0)
HEMOGLOBIN: 12.6 g/dL (ref 12.0–15.0)
MCH: 28.3 pg (ref 26.0–34.0)
MCHC: 32.7 g/dL (ref 30.0–36.0)
MCV: 86.3 fL (ref 78.0–100.0)
Platelets: 258 10*3/uL (ref 150–400)
RBC: 4.46 MIL/uL (ref 3.87–5.11)
RDW: 13.4 % (ref 11.5–15.5)
WBC: 6.7 10*3/uL (ref 4.0–10.5)

## 2016-07-06 LAB — LIPASE, BLOOD: Lipase: 30 U/L (ref 11–51)

## 2016-07-06 MED ORDER — PHENAZOPYRIDINE HCL 200 MG PO TABS: 200.0000 mg | ORAL_TABLET | Freq: Three times a day (TID) | ORAL | 0 refills | Status: DC | PRN

## 2016-07-06 MED ORDER — NITROFURANTOIN MONOHYD MACRO 100 MG PO CAPS: 100.0000 mg | ORAL_CAPSULE | Freq: Two times a day (BID) | ORAL | 0 refills | Status: DC

## 2016-07-06 MED ORDER — PHENAZOPYRIDINE HCL 100 MG PO TABS
95.0000 mg | ORAL_TABLET | Freq: Once | ORAL | Status: AC
  Administered 2016-07-06: 100 mg via ORAL
  Filled 2016-07-06: qty 1

## 2016-07-06 MED ORDER — NITROFURANTOIN MONOHYD MACRO 100 MG PO CAPS
100.0000 mg | ORAL_CAPSULE | Freq: Once | ORAL | Status: AC
  Administered 2016-07-06: 100 mg via ORAL
  Filled 2016-07-06: qty 1

## 2016-07-06 MED ORDER — HYDROCODONE-ACETAMINOPHEN 5-325 MG PO TABS
1.0000 | ORAL_TABLET | Freq: Once | ORAL | Status: AC
  Administered 2016-07-06: 1 via ORAL
  Filled 2016-07-06: qty 1

## 2016-07-06 NOTE — ED Provider Notes (Signed)
[facility] DEPT Provider Note   CSN: [id number] Arrival date & time: 07/06/16  1333     History   Chief Complaint Chief Complaint  Patient presents with  . Abdominal Pain    HPI [name] is a 56 y.o. female.  The history is provided by the patient and medical records.  Abdominal Pain   This is a new problem. The current episode started more than 2 days ago. The problem occurs constantly. The problem has not changed since onset.The pain is located in the suprapubic region. The pain is mild. Associated symptoms include nausea. Pertinent negatives include diarrhea. Nothing aggravates the symptoms. Nothing relieves the symptoms.   Had UTI, treated with cipro and initially had improved, however over the last few days, return of symptoms. On review of records the E coli was resistant to Cipro and she was never notified to change abx.   Past Medical History:  Diagnosis Date  . Anemia    hx of   . 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 Asthma    due to allergies   . Cancer ([place])   . Complication of anesthesia   . Diabetes mellitus   . Dysrhythmia    " irregular heart rate per patient"  . Family history of adverse reaction to anesthesia    difficult for father to wake after surgery  . Heart murmur   . Hypercholesteremia   . Hypertension   . PONV (postoperative nausea and vomiting)   . Thyroid disease   . TIA (transient ischemic attack)     Patient Active Problem List   Diagnosis Date Noted  . Stroke-like symptoms 05/16/2016  . 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 Fatty liver disease, nonalcoholic 92/33/0076  . Slurred speech 09/13/2015  . Parotitis 09/13/2015  . Gastroenteritis 08/21/2015  . Hypothyroid 08/21/2015  . 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 Pain in the chest   . Essential hypertension   . Thoracic aortic aneurysm ([place])   . Chest pain 04/23/2014  . Anginal pain ([place])  04/23/2014  . Type 2 diabetes mellitus ([place])   . Hypertension   . Hypercholesteremia     Past Surgical History:  Procedure Laterality Date  . ABDOMINAL HYSTERECTOMY    . CHOLECYSTECTOMY    . COLON SURGERY    . KNEE ARTHROSCOPY    . LEFT HEART CATHETERIZATION WITH CORONARY ANGIOGRAM N/A 04/24/2014   Procedure: LEFT HEART CATHETERIZATION WITH CORONARY ANGIOGRAM;  Surgeon: Burnell Blanks, MD;  Location: [facility] CATH LAB;  Service: Cardiovascular;  Laterality: N/A;  . THYROIDECTOMY N/A 09/20/2014   Procedure: TOTAL THYROIDECTOMY;  Surgeon: Armandina Gemma, MD;  Location: [facility] ORS;  Service: General;  Laterality: N/A;  . TONSILLECTOMY      OB History    No data available       Home Medications    Prior to Admission medications   Medication Sig Start Date End Date Taking? Authorizing Provider  acetaminophen (TYLENOL) 500 MG tablet Take 1,000 mg by mouth every 4 (four) hours as needed (body aches).    Historical Provider, MD  albuterol (PROVENTIL HFA;VENTOLIN HFA) 108 (90 BASE) MCG/ACT inhaler Inhale 2 puffs into the lungs every 4 (four) hours as needed for wheezing or shortness of breath. 09/07/13   Leeanne Rio, MD  calcium-vitamin D (OSCAL WITH D) 500-200 MG-UNIT tablet Take 2 tablets by mouth 3 (three) times daily. 05/17/16   Geradine Girt, DO  ciprofloxacin (CIPRO) 500 MG tablet Take 1 tablet (500 mg total) by mouth 2 (two) times daily. One po bid x 7 days 06/23/16   Milton Ferguson, MD  clopidogrel (PLAVIX) 75 MG tablet Take 1 tablet (75 mg total) by mouth daily. 09/15/15   Florencia Reasons, MD  diphenhydrAMINE (BENADRYL) 25 MG tablet Take 25 mg by mouth at bedtime.    Historical Provider, MD  furosemide (LASIX) 40 MG tablet Take 2 tablets (80 mg total) by mouth 2 (two) times daily. 05/17/16   Geradine Girt, DO  lansoprazole (PREVACID) 15 MG capsule Take 1 capsule (15 mg total) by mouth daily at 12 noon. Patient taking differently: Take 15 mg by mouth daily.  03/07/15   Sharlett Iles,  MD  levothyroxine (SYNTHROID, LEVOTHROID) 300 MCG tablet Take 300 mcg by mouth daily before breakfast.    Historical Provider, MD  lisinopril (PRINIVIL,ZESTRIL) 10 MG tablet Take 10 mg by mouth daily.  09/09/14   Historical Provider, MD  metFORMIN (GLUCOPHAGE) 1000 MG tablet Take 1 tablet (1,000 mg total) by mouth 2 (two) times daily with a meal. 04/29/14   Maryann Mikhail, DO  metoprolol (LOPRESSOR) 50 MG tablet Take 50 mg by mouth 2 (two) times daily.    Historical Provider, MD  nitrofurantoin, macrocrystal-monohydrate, (MACROBID) 100 MG capsule Take 1 capsule (100 mg total) by mouth 2 (two) times daily. X 7 days 07/06/16   Merrily Pew, MD  Omega-3 Fatty Acids (FISH OIL PO) Take 1 capsule by mouth daily.    Historical Provider, MD  ondansetron (ZOFRAN-ODT) 8 MG disintegrating tablet Take 8 mg by mouth 3 (three) times daily as needed for nausea or vomiting.    Historical Provider, MD  phenazopyridine (PYRIDIUM) 200 MG tablet Take 1 tablet (200 mg total) by mouth 3 (three) times daily as needed for pain. 07/06/16   Merrily Pew, MD  potassium chloride SA (K-DUR,KLOR-CON) 20 MEQ tablet Take 40 mEq by mouth 2 (two) times daily.     Historical Provider, MD  pravastatin (PRAVACHOL) 20 MG tablet Take 20 mg by mouth daily.  06/25/13   Historical Provider, MD  Probiotic Product (PROBIOTIC PO) Take 1 tablet by mouth daily.    Historical Provider, MD  promethazine (PHENERGAN) 25 MG tablet Take 1 tablet (25 mg total) by mouth every 6 (six) hours as needed for nausea or vomiting. 10/11/15   Virgel Manifold, MD  sitaGLIPtin (JANUVIA) 100 MG tablet Take 100 mg by mouth daily.    Historical Provider, MD    Family History Family History  Problem Relation Age of Onset  . Heart failure Mother   . Diabetes Mother   . Stroke Father   . Heart failure Father   . Cancer Father     Social History Social History  Substance Use Topics  . Smoking status: Never Smoker  . Smokeless tobacco: Never Used  . Alcohol use No       Allergies   Lorabid [loracarbef]; Augmentin [amoxicillin-pot clavulanate]; Codeine; Sulfa drugs cross reactors; Benicar [olmesartan medoxomil]; and Other   Review of Systems Review of Systems  Gastrointestinal: Positive for abdominal pain and nausea. Negative for diarrhea.  Genitourinary: Positive for urgency.  All other systems reviewed and are negative.    Physical Exam Updated Vital Signs BP (!) 126/94 (BP Location: Right Arm)   Pulse 63   Temp 98.9 F (37.2 C) (Oral)   Resp 16   Ht 5\' 7"  (1.702 m)   Wt 247 lb (112 kg)  SpO2 100%   BMI 38.69 kg/m   Physical Exam  Constitutional: She appears well-developed and well-nourished.  HENT:  Head: Normocephalic and atraumatic.  Eyes: Conjunctivae and EOM are normal.  Neck: Normal range of motion.  Cardiovascular: Normal rate and regular rhythm.   Pulmonary/Chest: No stridor. No respiratory distress.  Abdominal: Soft. She exhibits no distension.  Neurological: She is alert.  Skin: Skin is warm and dry.  Nursing note and vitals reviewed.    ED Treatments / Results  Labs (all labs ordered are listed, but only abnormal results are displayed) Labs Reviewed  COMPREHENSIVE METABOLIC PANEL - Abnormal; Notable for the following:       Result Value   Glucose, Bld 209 (*)    Calcium 8.7 (*)    All other components within normal limits  URINALYSIS, ROUTINE W REFLEX MICROSCOPIC - Abnormal; Notable for the following:    Color, Urine STRAW (*)    Leukocytes, UA TRACE (*)    Bacteria, UA FEW (*)    All other components within normal limits  URINE CULTURE  LIPASE, BLOOD  CBC    EKG  EKG Interpretation None       Radiology No results found.  Procedures Procedures (including critical care time)  Medications Ordered in ED Medications  nitrofurantoin (macrocrystal-monohydrate) (MACROBID) capsule 100 mg (100 mg Oral Given 07/06/16 1644)  HYDROcodone-acetaminophen (NORCO/VICODIN) 5-325 MG per tablet 1 tablet (1  tablet Oral Given 07/06/16 1644)  phenazopyridine (PYRIDIUM) tablet 100 mg (100 mg Oral Given 07/06/16 1644)     Initial Impression / Assessment and Plan / ED Course  I have reviewed the triage vital signs and the nursing notes.  Pertinent labs & imaging results that were available during my care of the patient were reviewed by me and considered in my medical decision making (see chart for details).     Likely persistent partially treated UTI. Will dc on Nitrofurantoin and pyridium. pcp follow up to ensure improvement as she has a complicated medical and renal history but no e/o pyelonephritis or sepsis here today.   Final Clinical Impressions(s) / ED Diagnoses   Final diagnoses:  Urinary tract infection without hematuria, site unspecified    New Prescriptions New Prescriptions   NITROFURANTOIN, MACROCRYSTAL-MONOHYDRATE, (MACROBID) 100 MG CAPSULE    Take 1 capsule (100 mg total) by mouth 2 (two) times daily. X 7 days   PHENAZOPYRIDINE (PYRIDIUM) 200 MG TABLET    Take 1 tablet (200 mg total) by mouth 3 (three) times daily as needed for pain.     Merrily Pew, MD 07/06/16 [phone]

## 2016-07-06 NOTE — ED Triage Notes (Signed)
Pt presents to the ed with complaints of lower abdominal pain and nausea.  She was just here and diagnosed with a uti and finished all of her antibiotics, states this feels the same but she does feel worse than last time

## 2016-07-09 LAB — URINE CULTURE: Culture: >=100000 — AB

## 2016-07-10 ENCOUNTER — Telehealth: Payer: Self-pay

## 2016-07-10 NOTE — Telephone Encounter (Signed)
Post ED Visit - Positive Culture Follow-up  Culture report reviewed by antimicrobial stewardship pharmacist:  []  Elenor Quinones, Pharm.D. []  Heide Guile, Pharm.D., BCPS AQ-ID []  Parks Neptune, Pharm.D., BCPS []  Alycia Rossetti, Pharm.D., BCPS []  [place], Pharm.D., BCPS, AAHIVP []  Legrand Como, Pharm.D., BCPS, AAHIVP []  Salome Arnt, PharmD, BCPS [x]  Dimitri Ped, PharmD, BCPS []  Vincenza Hews, PharmD, BCPS  Positive urine culture Treated with Nitrofurantoin Monohyd Macro, organism sensitive to the same and no further patient follow-up is required at this time.  Genia Del 07/10/2016, 10:00 AM

## 2016-07-13 ENCOUNTER — Ambulatory Visit: Payer: Self-pay | Admitting: Diagnostic Neuroimaging

## 2016-07-27 ENCOUNTER — Encounter: Payer: Self-pay | Admitting: Diagnostic Neuroimaging

## 2016-07-27 ENCOUNTER — Ambulatory Visit (INDEPENDENT_AMBULATORY_CARE_PROVIDER_SITE_OTHER): Payer: BC Managed Care – PPO | Admitting: Diagnostic Neuroimaging

## 2016-07-27 VITALS — BP 128/78 | HR 70 | Ht 66.0 in | Wt 246.4 lb

## 2016-07-27 DIAGNOSIS — R2990 Unspecified symptoms and signs involving the nervous system: Secondary | ICD-10-CM

## 2016-07-27 DIAGNOSIS — E119 Type 2 diabetes mellitus without complications: Secondary | ICD-10-CM

## 2016-07-27 DIAGNOSIS — K1120 Sialoadenitis, unspecified: Secondary | ICD-10-CM | POA: Diagnosis not present

## 2016-07-27 DIAGNOSIS — R531 Weakness: Secondary | ICD-10-CM | POA: Diagnosis not present

## 2016-07-27 DIAGNOSIS — R4781 Slurred speech: Secondary | ICD-10-CM | POA: Diagnosis not present

## 2016-07-27 DIAGNOSIS — G459 Transient cerebral ischemic attack, unspecified: Secondary | ICD-10-CM

## 2016-07-27 NOTE — Patient Instructions (Addendum)
Thank you for coming to see [place] at [facility]. I hope we have been able to provide you high quality care today.  You may receive a patient satisfaction survey over the next few weeks. We would appreciate your feedback and comments so that we may continue to improve ourselves and the health of our patients.  - continue plavix 53m daily  - continue diabetes, BP and lipid medications  - consider sleep study   ~~~~~~~~~~~~~~~~~~~~~~~~~~~~~~~~~~~~~~~~~~~~~~~~~~~~~~~~~~~~~~~~~  DR. Candelaria Pies'S GUIDE TO HAPPY AND HEALTHY LIVING These are some of my general health and wellness recommendations. Some of them may apply to you better than others. Please use common sense as you try these suggestions and feel free to ask me any questions.   ACTIVITY/FITNESS Mental, social, emotional and physical stimulation are very important for brain and body health. Try learning a new activity (arts, music, language, sports, games).  Keep moving your body to the best of your abilities. You can do this at home, inside or outside, the park, community center, gym or anywhere you like. Consider a physical therapist or personal trainer to get started. Consider the app Sworkit. Fitness trackers such as smart-watches, smart-phones or Fitbits can help as well.   NUTRITION Eat more plants: colorful vegetables, nuts, seeds and berries.  Eat less sugar, salt, preservatives and processed foods.  Avoid toxins such as cigarettes and alcohol.  Drink water when you are thirsty. Warm water with a slice of lemon is an excellent morning drink to start the day.  Consider these websites for more information The Nutrition [employer] (hhttps://www.henry-hernandez.biz/ [employer] (wWindowBlog.ch   RELAXATION Consider practicing mindfulness meditation or other relaxation techniques such as deep breathing, prayer, yoga, tai chi, massage. See website mindful.org or  the apps Headspace or Calm to help get started.   SLEEP Try to get at least 7-8+ hours sleep per day. Regular exercise and reduced caffeine will help you sleep better. Practice good sleep hygeine techniques. See website sleep.org for more information.   PLANNING Prepare estate planning, living will, healthcare POA documents. Sometimes this is best planned with the help of an attorney. Theconversationproject.org and agingwithdignity.org are excellent resources.

## 2016-07-27 NOTE — Progress Notes (Signed)
[facility]  PATIENT: [name] DOB: [birth year]  REFERRING CLINICIAN: Princella Ion, DO HISTORY FROM: patient and husband  REASON FOR VISIT: new consult    HISTORICAL  CHIEF COMPLAINT:  Chief Complaint  Patient presents with  . Stroke-like symptoms    rm 6, husband- Glendell Docker, "I've had 2 TIA's- Jan 2017, Feb 2018"    HISTORY OF PRESENT ILLNESS:   56 year old female here for evaluation of possible transient ischemic attacks.   June 2017 patient had onset of numbness, tingling, lip twitching, nausea and vomiting. Patient was admitted for stroke workup. MRI of the brain was negative. Stroke risk factors were identified and treated.  February 2018 patient had onset of left arm and face numbness and weakness lasting for 4-6 hours. Patient was admitted for workup. MRI of the brain was negative for acute stroke. Stroke risk factors were evaluated and treated.  Since that time symptoms have resolved. Patient continues to have other issues including memory loss, headaches, numbness, weakness, dizziness, not asleep, joint pain, increased thirst, short of breath, fevers, chills, fatigue, chest pain, swelling of legs, ringing in ears, spinning sensation.  Patient has history of diabetes, hypertension, thyroid cancer, irregular heartbeat/atrial fibrillation in the past.    REVIEW OF SYSTEMS: Full 14 system review of systems performed and negative with exception of: As per history of present illness.   ALLERGIES: Allergies  Allergen Reactions  . Lorabid [Loracarbef] Anaphylaxis  . Augmentin [Amoxicillin-Pot Clavulanate] Hives, Nausea And Vomiting and Other (See Comments)    Chest pain  . Codeine Nausea And Vomiting  . Sulfa Drugs Cross Reactors Other (See Comments)    Severe allergic reaction as a child - was not told symptoms  . Benicar [Olmesartan Medoxomil] Swelling and Rash  . Other Rash    Green peas    HOME MEDICATIONS: Outpatient Medications Prior to Visit    Medication Sig Dispense Refill  . acetaminophen (TYLENOL) 500 MG tablet Take 1,000 mg by mouth every 4 (four) hours as needed (body aches).    Marland Kitchen albuterol (PROVENTIL HFA;VENTOLIN HFA) 108 (90 BASE) MCG/ACT inhaler Inhale 2 puffs into the lungs every 4 (four) hours as needed for wheezing or shortness of breath. 18 g 0  . calcium-vitamin D (OSCAL WITH D) 500-200 MG-UNIT tablet Take 2 tablets by mouth 3 (three) times daily.    . clopidogrel (PLAVIX) 75 MG tablet Take 1 tablet (75 mg total) by mouth daily. 30 tablet 0  . diphenhydrAMINE (BENADRYL) 25 MG tablet Take 25 mg by mouth at bedtime.    . furosemide (LASIX) 40 MG tablet Take 2 tablets (80 mg total) by mouth 2 (two) times daily.    . lansoprazole (PREVACID) 15 MG capsule Take 1 capsule (15 mg total) by mouth daily at 12 noon. (Patient taking differently: Take 15 mg by mouth daily. ) 30 capsule 0  . levothyroxine (SYNTHROID, LEVOTHROID) 300 MCG tablet Take 300 mcg by mouth daily before breakfast.    . lisinopril (PRINIVIL,ZESTRIL) 10 MG tablet Take 10 mg by mouth daily.   5  . metFORMIN (GLUCOPHAGE) 1000 MG tablet Take 1 tablet (1,000 mg total) by mouth 2 (two) times daily with a meal. 60 tablet 0  . Omega-3 Fatty Acids (FISH OIL PO) Take 1 capsule by mouth daily.    . ondansetron (ZOFRAN-ODT) 8 MG disintegrating tablet Take 8 mg by mouth 3 (three) times daily as needed for nausea or vomiting.    . phenazopyridine (PYRIDIUM) 200 MG tablet Take 1 tablet (200  mg total) by mouth 3 (three) times daily as needed for pain. 6 tablet 0  . potassium chloride SA (K-DUR,KLOR-CON) 20 MEQ tablet Take 40 mEq by mouth 2 (two) times daily.     . pravastatin (PRAVACHOL) 20 MG tablet Take 20 mg by mouth daily.     . Probiotic Product (PROBIOTIC PO) Take 1 tablet by mouth daily.    . promethazine (PHENERGAN) 25 MG tablet Take 1 tablet (25 mg total) by mouth every 6 (six) hours as needed for nausea or vomiting. 20 tablet 0  . sitaGLIPtin (JANUVIA) 100 MG tablet  Take 100 mg by mouth daily.    . ciprofloxacin (CIPRO) 500 MG tablet Take 1 tablet (500 mg total) by mouth 2 (two) times daily. One po bid x 7 days 14 tablet 0  . metoprolol (LOPRESSOR) 50 MG tablet Take 50 mg by mouth 2 (two) times daily.    . nitrofurantoin, macrocrystal-monohydrate, (MACROBID) 100 MG capsule Take 1 capsule (100 mg total) by mouth 2 (two) times daily. X 7 days 14 capsule 0   No facility-administered medications prior to visit.     PAST MEDICAL HISTORY: Past Medical History:  Diagnosis Date  . Anemia    hx of   . 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 Asthma    due to allergies   . Cancer ([place])   . Complication of anesthesia   . Diabetes mellitus   . Dysrhythmia    " irregular heart rate per patient"  . Family history of adverse reaction to anesthesia    difficult for father to wake after surgery  . Heart murmur   . Hypercholesteremia   . Hypertension   . PONV (postoperative nausea and vomiting)   . Thyroid disease   . TIA (transient ischemic attack)     PAST SURGICAL HISTORY: Past Surgical History:  Procedure Laterality Date  . ABDOMINAL HYSTERECTOMY    . CHOLECYSTECTOMY    . COLON SURGERY    . KNEE ARTHROSCOPY    . LEFT HEART CATHETERIZATION WITH CORONARY ANGIOGRAM N/A 04/24/2014   Procedure: LEFT HEART CATHETERIZATION WITH CORONARY ANGIOGRAM;  Surgeon: Burnell Blanks, MD;  Location: [facility] CATH LAB;  Service: Cardiovascular;  Laterality: N/A;  . THYROIDECTOMY N/A 09/20/2014   Procedure: TOTAL THYROIDECTOMY;  Surgeon: Armandina Gemma, MD;  Location: [facility] ORS;  Service: General;  Laterality: N/A;  . TONSILLECTOMY      FAMILY HISTORY: Family History  Problem Relation Age of Onset  . Heart failure Mother   . Diabetes Mother   . Stroke Father   . Heart failure Father   . Cancer Father     SOCIAL HISTORY:  Social History   Social History  . Marital status: Married    Spouse name: Glendell Docker  . Number of children: 2  . Years of education: 14    Occupational History  .      NA, disabled   Social History Main Topics  . Smoking status: Never Smoker  . Smokeless tobacco: Never Used  . Alcohol use No  . Drug use: No  . Sexual activity: Not on file   Other Topics Concern  . Not on file   Social History Narrative   Lives with husband and son     PHYSICAL EXAM  GENERAL EXAM/CONSTITUTIONAL: Vitals:  Vitals:   07/27/16 0850  BP: 128/78  Pulse: 70  Weight: 246 lb 6.4 oz (111.8 kg)  Height: 5\' 6"  (1.676 m)  Body mass index is 39.77 kg/m.  Visual Acuity Screening   Right eye Left eye Both eyes  Without correction: 20/40 20/30   With correction:        Patient is in no distress; well developed, nourished and groomed; neck is supple  CARDIOVASCULAR:  Examination of carotid arteries is normal; no carotid bruits  Regular rate and rhythm, no murmurs  Examination of peripheral vascular system by observation and palpation is normal  EYES:  Ophthalmoscopic exam of optic discs and posterior segments is normal; no papilledema or hemorrhages  MUSCULOSKELETAL:  Gait, strength, tone, movements noted in Neurologic exam below  NEUROLOGIC: MENTAL STATUS:  No flowsheet data found.  awake, alert, oriented to person, place and time  recent and remote memory intact  normal attention and concentration  language fluent, comprehension intact, naming intact,   fund of knowledge appropriate  CRANIAL NERVE:   2nd - no papilledema on fundoscopic exam  2nd, 3rd, 4th, 6th - pupils equal and reactive to light, visual fields full to confrontation, extraocular muscles intact, no nystagmus  5th - facial sensation symmetric  7th - facial strength symmetric  8th - hearing intact  9th - palate elevates symmetrically, uvula midline  11th - shoulder shrug symmetric  12th - tongue protrusion midline  MOTOR:   normal bulk and tone, full strength in the BUE, BLE  SENSORY:   normal and symmetric to light  touch  ABSENT VIB AT TOES, ANKLES AND KNEES  COORDINATION:   finger-nose-finger, fine finger movements normal  REFLEXES:   deep tendon reflexes TRACE and symmetric  GAIT/STATION:   ANTALGIC, LIMPING GAIT; LEFT LEG PAIN    DIAGNOSTIC DATA (LABS, IMAGING, TESTING) - I reviewed patient records, labs, notes, testing and imaging myself where available.  Lab Results  Component Value Date   WBC 6.7 07/06/2016   HGB 12.6 07/06/2016   HCT 38.5 07/06/2016   MCV 86.3 07/06/2016   PLT 258 07/06/2016      Component Value Date/Time   NA 142 07/06/2016 1349   K 4.0 07/06/2016 1349   CL 103 07/06/2016 1349   CO2 28 07/06/2016 1349   GLUCOSE 209 (H) 07/06/2016 1349   BUN 11 07/06/2016 1349   CREATININE 0.88 07/06/2016 1349   CALCIUM 8.7 (L) 07/06/2016 1349   PROT 7.1 07/06/2016 1349   ALBUMIN 3.6 07/06/2016 1349   AST 17 07/06/2016 1349   ALT 18 07/06/2016 1349   ALKPHOS 62 07/06/2016 1349   BILITOT 0.7 07/06/2016 1349   GFRNONAA >60 07/06/2016 1349   GFRAA >60 07/06/2016 1349   Lab Results  Component Value Date   CHOL 122 05/17/2016   HDL 35 (L) 05/17/2016   LDLCALC 47 05/17/2016   TRIG 198 (H) 05/17/2016   CHOLHDL 3.5 05/17/2016   Lab Results  Component Value Date   HGBA1C 6.9 (H) 05/17/2016   No results found for: VITAMINB12 No results found for: TSH   09/13/15 CT soft tissue neck - LEFT parotid appears slightly larger than the RIGHT, correlate clinically for mild inflammatory change in the LEFT parotid tail. - LEFT paramedian medial incisor periapical lucency. Uncertain significance with regard and jaw pain. - No mandibular periodontal disease or other significant abnormality.  - Status post thyroidectomy.  No concerning lymphadenopathy.  05/16/16 MRI brain - No acute intracranial abnormality. Stable since 2017 and largely unremarkable for age noncontrast MRI appearance of the brain.  05/16/16 MRA head  - Stable and negative intracranial MRA.  05/17/16  carotid u/s -  The vertebral arteries appear patent with antegrade flow. - Findings consistent with 1-39 percent stenosis involving the right internal carotid artery and the left internal carotid artery.  05/17/16 TTE  - Left ventricle: The cavity size was normal. Wall thickness was   increased in a pattern of mild LVH. Systolic function was normal.   The estimated ejection fraction was in the range of 60% to 65%.   Left ventricular diastolic function parameters were normal. - Left atrium: The atrium was mildly dilated. - Atrial septum: No defect or patent foramen ovale was identified.    ASSESSMENT AND PLAN  56 y.o. year old female here with hypertension, diabetes, with 2 episodes of strokelike symptoms, with negative MRI brain scan both times. May represent transient ischemic attacks. Recommend medical management of risk factors as currently. No further neurologic testing advised. May follow up with PCP.     Dx: TIA  1. Transient cerebral ischemia, unspecified type   2. Stroke-like symptoms   3. Slurred speech   4. Left-sided weakness   5. Type 2 diabetes mellitus without complication, without long-term current use of insulin (HCC)   6. Parotitis      PLAN: - continue plavix 75mg  daily - continue diabetes, BP and lipid medications - consider sleep study (BMI 40, hypertension, TIA)  Return if symptoms worsen or fail to improve, for return to PCP.    Penni Bombard, MD 10/28/8001, 4:91 AM Certified in Neurology, Neurophysiology and Neuroimaging  [facility] [address], [place], [place] [address] [phone]

## 2016-12-10 ENCOUNTER — Other Ambulatory Visit: Payer: Self-pay | Admitting: Urology

## 2016-12-10 DIAGNOSIS — C641 Malignant neoplasm of right kidney, except renal pelvis: Secondary | ICD-10-CM

## 2016-12-20 ENCOUNTER — Ambulatory Visit (HOSPITAL_COMMUNITY)
Admission: RE | Admit: 2016-12-20 | Discharge: 2016-12-20 | Disposition: A | Discharge status: Home / Self Care | Payer: BC Managed Care – PPO | Source: Ambulatory Visit | Attending: Urology | Admitting: Urology

## 2016-12-20 DIAGNOSIS — C641 Malignant neoplasm of right kidney, except renal pelvis: Secondary | ICD-10-CM | POA: Diagnosis not present

## 2016-12-20 DIAGNOSIS — M5136 Other intervertebral disc degeneration, lumbar region: Secondary | ICD-10-CM | POA: Diagnosis not present

## 2016-12-20 DIAGNOSIS — K760 Fatty (change of) liver, not elsewhere classified: Secondary | ICD-10-CM | POA: Insufficient documentation

## 2016-12-20 LAB — POCT I-STAT CREATININE: Creatinine, Ser: 1.2 mg/dL — ABNORMAL HIGH (ref 0.44–1.00)

## 2016-12-20 MED ORDER — GADOBENATE DIMEGLUMINE 529 MG/ML IV SOLN
20.0000 mL | Freq: Once | INTRAVENOUS | Status: AC | PRN
  Administered 2016-12-20: 20 mL via INTRAVENOUS

## 2017-01-05 ENCOUNTER — Other Ambulatory Visit: Payer: Self-pay | Admitting: Urology

## 2017-01-05 DIAGNOSIS — N2889 Other specified disorders of kidney and ureter: Secondary | ICD-10-CM

## 2017-01-12 ENCOUNTER — Ambulatory Visit
Admission: RE | Admit: 2017-01-12 | Discharge: 2017-01-12 | Disposition: A | Discharge status: Home / Self Care | Payer: BC Managed Care – PPO | Source: Ambulatory Visit | Attending: Urology | Admitting: Urology

## 2017-01-12 DIAGNOSIS — N2889 Other specified disorders of kidney and ureter: Secondary | ICD-10-CM

## 2017-01-12 HISTORY — PX: IR RADIOLOGIST EVAL & MGMT: IMG5224

## 2017-01-12 NOTE — Consult Note (Signed)
Chief Complaint: Right sided renal lesions  Referring Physician(s): Bell,Eugene D III (Urology)  History of Present Illness: [name] is a 56 y.o. female with past medical history significant for diabetes, hypertension, hyperlipidemia, heart murmur and thyroid cancer who was found to have a indeterminate right-sided renal lesion on abdominal CT performed 11/28/2015 for the workup of chronic mid abdominal pain.   Subsequent abdominal MRI performed 12/29/2015 demonstrated an approximately 1.5 x 1.4 cm heterogeneously enhancing lesion arising from the superior pole of the right kidney as well as an approximately 1.0 cm lesion within the posterior interpolar aspect of the right kidney, both of which were worrisome for renal cell carcinoma.   Subsequent surveillance abdominal MRI performed 12/20/2016 demonstrates interval enlargement of the dominant lesion arising from the superior pole of the right kidney, currently measuring 1.8 x 1.7 cm without significant change in the approximately 1.0 cm right posterior interpolar lesion.  Patient was seen by Dr. Gloriann Loan and given her multiple medical comorbidities, was referred to the interventional radiology clinic for discussion of potential percutaneous management. The patient is accompanied by her family member though serves as her own historian.  The patient is asymptomatic in regards to the right-sided renal lesions. Specifically, no flank pain or hematuria. No fever or chills. No chest pain or shortness of breath.   Past Medical History:  Diagnosis Date  . Anemia    hx of   . 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 Asthma    due to allergies   . Cancer ([place])   . Complication of anesthesia   . Diabetes mellitus   . Dysrhythmia    " irregular heart rate per patient"  . Family history of adverse reaction to anesthesia    difficult for father to wake after surgery  . Heart murmur   . Hypercholesteremia   . Hypertension   . PONV (postoperative  nausea and vomiting)   . Thyroid disease   . TIA (transient ischemic attack)     Past Surgical History:  Procedure Laterality Date  . ABDOMINAL HYSTERECTOMY    . CHOLECYSTECTOMY    . COLON SURGERY    . KNEE ARTHROSCOPY    . LEFT HEART CATHETERIZATION WITH CORONARY ANGIOGRAM N/A 04/24/2014   Procedure: LEFT HEART CATHETERIZATION WITH CORONARY ANGIOGRAM;  Surgeon: Burnell Blanks, MD;  Location: [facility] CATH LAB;  Service: Cardiovascular;  Laterality: N/A;  . THYROIDECTOMY N/A 09/20/2014   Procedure: TOTAL THYROIDECTOMY;  Surgeon: Armandina Gemma, MD;  Location: [facility] ORS;  Service: General;  Laterality: N/A;  . TONSILLECTOMY      Allergies: Lorabid [loracarbef]; Augmentin [amoxicillin-pot clavulanate]; Codeine; Sulfa drugs cross reactors; Benicar [olmesartan medoxomil]; and Other  Medications: Prior to Admission medications   Medication Sig Start Date End Date Taking? Authorizing Provider  acetaminophen (TYLENOL) 500 MG tablet Take 1,000 mg by mouth every 4 (four) hours as needed (body aches).   Yes [provider]  albuterol (PROVENTIL HFA;VENTOLIN HFA) 108 (90 BASE) MCG/ACT inhaler Inhale 2 puffs into the lungs every 4 (four) hours as needed for wheezing or shortness of breath. 09/07/13  Yes Leeanne Rio, MD  calcium-vitamin D (OSCAL WITH D) 500-200 MG-UNIT tablet Take 2 tablets by mouth 3 (three) times daily. 05/17/16  Yes Geradine Girt, DO  clopidogrel (PLAVIX) 75 MG tablet Take 1 tablet (75 mg total) by mouth daily. 09/15/15  Yes Florencia Reasons, MD  diphenhydrAMINE (BENADRYL) 25 MG tablet Take 25 mg by mouth at bedtime.  Yes [provider]  furosemide (LASIX) 40 MG tablet Take 2 tablets (80 mg total) by mouth 2 (two) times daily. 05/17/16  Yes Eulogio Bear U, DO  lansoprazole (PREVACID) 15 MG capsule Take 1 capsule (15 mg total) by mouth daily at 12 noon. Patient taking differently: Take 15 mg by mouth daily.  03/07/15  Yes Little, Wenda Overland, MD  levothyroxine  (SYNTHROID, LEVOTHROID) 300 MCG tablet Take 300 mcg by mouth daily before breakfast.   Yes [provider]  lisinopril (PRINIVIL,ZESTRIL) 10 MG tablet Take 10 mg by mouth daily.  09/09/14  Yes [provider]  metFORMIN (GLUCOPHAGE) 1000 MG tablet Take 1 tablet (1,000 mg total) by mouth 2 (two) times daily with a meal. 04/29/14  Yes Mikhail, [place], DO  metoprolol (LOPRESSOR) 100 MG tablet Take 100 mg by mouth 2 (two) times daily. 06/23/16 06/23/17 Yes [provider]  Omega-3 Fatty Acids (FISH OIL PO) Take 1 capsule by mouth daily.   Yes [provider]  ondansetron (ZOFRAN-ODT) 8 MG disintegrating tablet Take 8 mg by mouth 3 (three) times daily as needed for nausea or vomiting.   Yes [provider]  potassium chloride SA (K-DUR,KLOR-CON) 20 MEQ tablet Take 40 mEq by mouth 2 (two) times daily.    Yes [provider]  pravastatin (PRAVACHOL) 20 MG tablet Take 20 mg by mouth daily.  06/25/13  Yes [provider]  Probiotic Product (PROBIOTIC PO) Take 1 tablet by mouth daily.   Yes [provider]  promethazine (PHENERGAN) 25 MG tablet Take 1 tablet (25 mg total) by mouth every 6 (six) hours as needed for nausea or vomiting. 10/11/15  Yes Virgel Manifold, MD  sitaGLIPtin (JANUVIA) 100 MG tablet Take 100 mg by mouth daily.   Yes [provider]  phenazopyridine (PYRIDIUM) 200 MG tablet Take 1 tablet (200 mg total) by mouth 3 (three) times daily as needed for pain. Patient not taking: Reported on 01/12/2017 07/06/16   Mesner, Corene Cornea, MD     Family History  Problem Relation Age of Onset  . Heart failure Mother   . Diabetes Mother   . Stroke Father   . Heart failure Father   . Cancer Father     Social History   Social History  . Marital status: Married    Spouse name: Glendell Docker  . Number of children: 2  . Years of education: 14   Occupational History  .      NA, disabled   Social History Main Topics  . Smoking status:  Never Smoker  . Smokeless tobacco: Never Used  . Alcohol use No  . Drug use: No  . Sexual activity: Not on file   Other Topics Concern  . Not on file   Social History Narrative   Lives with husband and son    ECOG Status: 1 - Symptomatic but completely ambulatory  Review of Systems: A 12 point ROS discussed and pertinent positives are indicated in the HPI above.  All other systems are negative.  Review of Systems  Constitutional: Negative for activity change, fatigue and fever.  Respiratory: Negative.   Cardiovascular: Negative.   Genitourinary: Negative for flank pain and hematuria.    Vital Signs: BP 119/70   Pulse 69   Temp 98.4 F (36.9 C) (Oral)   Resp 16   Ht 5\' 6"  (1.676 m)   Wt 243 lb (110.2 kg)   SpO2 99%   BMI 39.22 kg/m   Physical Exam  Constitutional:  She appears well-developed and well-nourished.  Appears older than her stated age.  HENT:  Head: Normocephalic and atraumatic.  Skin: Skin is warm and dry.  Nursing note and vitals reviewed.    Imaging:  CT scan abdomen and pelvis - 11/28/2015; abdominal MRI - 12/29/2015; 12/20/2016  Mr Abdomen Wwo Contrast  Result Date: 12/20/2016 CLINICAL DATA:  Right renal lesion surveillance. EXAM: MRI ABDOMEN WITHOUT AND WITH CONTRAST TECHNIQUE: Multiplanar multisequence MR imaging of the abdomen was performed both before and after the administration of intravenous contrast. CONTRAST:  23mL MULTIHANCE GADOBENATE DIMEGLUMINE 529 MG/ML IV SOLN COMPARISON:  Multiple exams, including 01/18/2016 FINDINGS: Lower chest: Unremarkable Hepatobiliary: Diffuse hepatic steatosis. Cholecystectomy. Otherwise unremarkable. Pancreas:  Unremarkable Spleen:  Unremarkable Adrenals/Urinary Tract:  Adrenal glands normal. Partially exophytic enhancing 1.7 by 1.8 cm (image 34/903) right kidney upper pole lesion with mixed but primarily hypointense T2 signal and intermediate T1 signal, this lesion measured 1.5 by 1.4 cm on 12/29/2015 (my  measurements). A second lesion, in the right mid kidney posteriorly, measures 1.0 by 0.8 cm (stable in size from 12/29/2015) on image 46/903 and appears to contain some nodular enhancement along the left margin, and has mixed signal intensity on precontrast T2 weighted images such as image 23/4. 5 mm right kidney lower pole Bosniak category 1 cyst. 5 mm cyst of the upper margin of the left kidney. Stomach/Bowel: Unremarkable Vascular/Lymphatic: No tumor thrombus in the left renal vein. Known abdominal aortic atherosclerotic vascular disease with left eccentric plaque near the inferior mesenteric artery takeoff. Porta hepatis lymph node 0.9 cm in short axis on image 10/4, chronically stable. No pathologic adenopathy. Other:  No supplemental non-categorized findings. Musculoskeletal: Lumbar degenerative disc disease. IMPRESSION: 1. The enhancing mass in the right kidney upper pole has mildly enlarged compared to the prior MRI of 12/29/2015. 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 2. The smaller 1.0 by 0.8 cm mass in the right mid kidney posteriorly demonstrates nodular enhancement likewise compatible with a small renal cell carcinoma. 3. No adenopathy, tumor thrombus, or evidence of metastatic spread at this time. 4. Other imaging findings of potential clinical significance: Diffuse hepatic steatosis. Lumbar degenerative disc disease. Electronically Signed   By: Van Clines M.D.   On: 12/20/2016 08:51    Labs:  CBC:  Recent Labs  05/16/16 1330 05/16/16 1343 06/23/16 0708 07/06/16 1349  WBC 6.8  --  7.0 6.7  HGB 12.9 12.6 12.0 12.6  HCT 38.4 37.0 37.4 38.5  PLT 235  --  222 258    COAGS:  Recent Labs  05/16/16 1330  INR 1.03  APTT 30    BMP:  Recent Labs  05/16/16 1330 05/16/16 1343 06/23/16 0708 07/06/16 1349 12/20/16 0752  NA 138 141 138 142  --   K 3.6 3.6 4.2 4.0  --   CL 104 105 101 103  --   CO2 23  --  26 28  --   GLUCOSE 182* 178* 281* 209*  --    BUN 12 14 17 11   --   CALCIUM 8.0*  --  8.3* 8.7*  --   CREATININE 1.00 0.90 0.92 0.88 1.20*  GFRNONAA >60  --  >60 >60  --   GFRAA >60  --  >60 >60  --     LIVER FUNCTION TESTS:  Recent Labs  05/16/16 1330 07/06/16 1349  BILITOT 0.7 0.7  AST 20 17  ALT 24 18  ALKPHOS 57 62  PROT 7.1 7.1  ALBUMIN 3.8 3.6  Assessment and Plan:  Linzi Ohlinger is a 56 y.o. female with past medical history significant for diabetes, hypertension, hyperlipidemia, heart murmur and thyroid cancer who was found to have a indeterminate right-sided renal lesion on abdominal CT performed 11/28/2015 for the workup of chronic mid abdominal pain.   Subsequent surveillance abdominal MRI performed 12/20/2016 demonstrates interval enlargement of the dominant lesion arising from the superior pole of the right kidney, currently measuring 1.8 x 1.7 cm (previously, 1.5 x 1.4 cm) without significant change in the approximately 1.0 cm right posterior interpolar lesion.  The patient is asymptomatic in regards to the right-sided renal lesions.   Given the patient's medical co morbidities, Dr. Gloriann Loan feels the patient is a poor operative candidate to undergo definitive resection.  As such, potential acute and treatment options were discussed at length with the patient including the following:  #1 - Continued observation with follow-up abdominal MRI in 6 months (March 2019).  #2 - Proceeding with image guided right-sided renal cryoablation and potential biopsy.  Risks and benefits of image guided cryoablation were discussed with the patient including, but not limited to failure to treat entire lesion, bleeding, infection, damage to adjacent structures, decrease in renal function or post procedural neuropathy.  All of the patient's questions were answered.  I explained that this procedure would be performed focused on the dominant lesion arising from the superior pole of the right kidney as the additional lesion within the  posterior interpolar aspect of the right kidney is unchanged and given the interpolar lesion's small size, the lesion may be difficult to be approached with image guidance (further degraded due to patient body habitus). I explained that the cryoablation would be performed with curative intent and that a biopsy may be acquired though only if it would not interfere with the cryoablation.  Following this prolonged and detailed conversation, the patient has elected to undergo continued observation with a follow-up MRI in 6 months.   I feel that this is reasonable as the lesion has demonstrated very minimal growth over the past year (approximately 3 mm), as well as to allow time to further evaluate any potential growth or change in the appearance of the additional indeterminate lesion within the posterior interpolar aspect of the right kidney.  As such, the patient will be seen in follow-up consultation following the acquisition of surveillance abdominal MRI in March of next year.   *Note, the patient reports that following her most recent abdominal MRI she experienced lip swelling and developed hives after she returned home. As such, MR contrast will be listed as an allergy for the patient and she will receive a standard 13 hour steroid and Benadryl prep prior to her March abdominal MRI.  Patient was encouraged to call the interventional radiology clinic with any interval questions or concerns.  Thank you for this interesting consult.  I greatly enjoyed meeting [name] and look forward to participating in their care.  A copy of this report was sent to the requesting provider on this date.  Electronically Signed: Sandi Mariscal 01/12/2017, 3:06 PM   I spent a total of 30 Minutes in face to face in clinical consultation, greater than 50% of which was counseling/coordinating care for right sided renal lesions.

## 2017-01-21 ENCOUNTER — Encounter (HOSPITAL_COMMUNITY): Payer: Self-pay | Admitting: Emergency Medicine

## 2017-01-21 ENCOUNTER — Inpatient Hospital Stay (HOSPITAL_COMMUNITY)
Admission: EM | Admit: 2017-01-21 | Discharge: 2017-01-24 | DRG: 690 | Disposition: A | Discharge status: Home / Self Care | Payer: BC Managed Care – PPO | Attending: Student in an Organized Health Care Education/Training Program | Admitting: Student in an Organized Health Care Education/Training Program

## 2017-01-21 DIAGNOSIS — E669 Obesity, unspecified: Secondary | ICD-10-CM | POA: Diagnosis not present

## 2017-01-21 DIAGNOSIS — I2510 Atherosclerotic heart disease of native coronary artery without angina pectoris: Secondary | ICD-10-CM | POA: Diagnosis present

## 2017-01-21 DIAGNOSIS — R109 Unspecified abdominal pain: Secondary | ICD-10-CM | POA: Diagnosis present

## 2017-01-21 DIAGNOSIS — R1013 Epigastric pain: Secondary | ICD-10-CM

## 2017-01-21 DIAGNOSIS — E119 Type 2 diabetes mellitus without complications: Secondary | ICD-10-CM | POA: Diagnosis present

## 2017-01-21 DIAGNOSIS — G8929 Other chronic pain: Secondary | ICD-10-CM | POA: Diagnosis not present

## 2017-01-21 DIAGNOSIS — N390 Urinary tract infection, site not specified: Secondary | ICD-10-CM | POA: Diagnosis present

## 2017-01-21 DIAGNOSIS — Z85850 Personal history of malignant neoplasm of thyroid: Secondary | ICD-10-CM | POA: Diagnosis not present

## 2017-01-21 DIAGNOSIS — R350 Frequency of micturition: Secondary | ICD-10-CM | POA: Diagnosis not present

## 2017-01-21 DIAGNOSIS — I10 Essential (primary) hypertension: Secondary | ICD-10-CM | POA: Diagnosis present

## 2017-01-21 DIAGNOSIS — K219 Gastro-esophageal reflux disease without esophagitis: Secondary | ICD-10-CM | POA: Diagnosis present

## 2017-01-21 DIAGNOSIS — Z9049 Acquired absence of other specified parts of digestive tract: Secondary | ICD-10-CM | POA: Diagnosis not present

## 2017-01-21 DIAGNOSIS — Z87440 Personal history of urinary (tract) infections: Secondary | ICD-10-CM

## 2017-01-21 DIAGNOSIS — Z79890 Hormone replacement therapy (postmenopausal): Secondary | ICD-10-CM

## 2017-01-21 DIAGNOSIS — Z91041 Radiographic dye allergy status: Secondary | ICD-10-CM

## 2017-01-21 DIAGNOSIS — Z23 Encounter for immunization: Secondary | ICD-10-CM | POA: Diagnosis not present

## 2017-01-21 DIAGNOSIS — Z882 Allergy status to sulfonamides status: Secondary | ICD-10-CM

## 2017-01-21 DIAGNOSIS — Z7984 Long term (current) use of oral hypoglycemic drugs: Secondary | ICD-10-CM | POA: Diagnosis not present

## 2017-01-21 DIAGNOSIS — Z7902 Long term (current) use of antithrombotics/antiplatelets: Secondary | ICD-10-CM

## 2017-01-21 DIAGNOSIS — Z8673 Personal history of transient ischemic attack (TIA), and cerebral infarction without residual deficits: Secondary | ICD-10-CM

## 2017-01-21 DIAGNOSIS — Z823 Family history of stroke: Secondary | ICD-10-CM

## 2017-01-21 DIAGNOSIS — B9689 Other specified bacterial agents as the cause of diseases classified elsewhere: Secondary | ICD-10-CM | POA: Diagnosis not present

## 2017-01-21 DIAGNOSIS — Z1619 Resistance to other specified beta lactam antibiotics: Secondary | ICD-10-CM | POA: Diagnosis not present

## 2017-01-21 DIAGNOSIS — M1990 Unspecified osteoarthritis, unspecified site: Secondary | ICD-10-CM | POA: Diagnosis present

## 2017-01-21 DIAGNOSIS — Z79899 Other long term (current) drug therapy: Secondary | ICD-10-CM | POA: Diagnosis not present

## 2017-01-21 DIAGNOSIS — R10816 Epigastric abdominal tenderness: Secondary | ICD-10-CM | POA: Diagnosis not present

## 2017-01-21 DIAGNOSIS — E890 Postprocedural hypothyroidism: Secondary | ICD-10-CM | POA: Diagnosis present

## 2017-01-21 DIAGNOSIS — Z888 Allergy status to other drugs, medicaments and biological substances status: Secondary | ICD-10-CM | POA: Diagnosis not present

## 2017-01-21 DIAGNOSIS — Z833 Family history of diabetes mellitus: Secondary | ICD-10-CM

## 2017-01-21 DIAGNOSIS — R3915 Urgency of urination: Secondary | ICD-10-CM | POA: Diagnosis not present

## 2017-01-21 DIAGNOSIS — Z8619 Personal history of other infectious and parasitic diseases: Secondary | ICD-10-CM

## 2017-01-21 DIAGNOSIS — Z881 Allergy status to other antibiotic agents status: Secondary | ICD-10-CM | POA: Diagnosis not present

## 2017-01-21 DIAGNOSIS — R8271 Bacteriuria: Secondary | ICD-10-CM | POA: Diagnosis not present

## 2017-01-21 DIAGNOSIS — N2889 Other specified disorders of kidney and ureter: Secondary | ICD-10-CM

## 2017-01-21 DIAGNOSIS — Z91018 Allergy to other foods: Secondary | ICD-10-CM

## 2017-01-21 DIAGNOSIS — N289 Disorder of kidney and ureter, unspecified: Secondary | ICD-10-CM | POA: Diagnosis not present

## 2017-01-21 DIAGNOSIS — R112 Nausea with vomiting, unspecified: Secondary | ICD-10-CM | POA: Diagnosis not present

## 2017-01-21 DIAGNOSIS — E785 Hyperlipidemia, unspecified: Secondary | ICD-10-CM | POA: Diagnosis present

## 2017-01-21 DIAGNOSIS — B961 Klebsiella pneumoniae [K. pneumoniae] as the cause of diseases classified elsewhere: Secondary | ICD-10-CM | POA: Diagnosis present

## 2017-01-21 DIAGNOSIS — Z885 Allergy status to narcotic agent status: Secondary | ICD-10-CM | POA: Diagnosis not present

## 2017-01-21 DIAGNOSIS — C649 Malignant neoplasm of unspecified kidney, except renal pelvis: Secondary | ICD-10-CM | POA: Diagnosis present

## 2017-01-21 DIAGNOSIS — R1030 Lower abdominal pain, unspecified: Secondary | ICD-10-CM | POA: Diagnosis not present

## 2017-01-21 DIAGNOSIS — N12 Tubulo-interstitial nephritis, not specified as acute or chronic: Secondary | ICD-10-CM

## 2017-01-21 DIAGNOSIS — J45909 Unspecified asthma, uncomplicated: Secondary | ICD-10-CM | POA: Diagnosis present

## 2017-01-21 DIAGNOSIS — Z8249 Family history of ischemic heart disease and other diseases of the circulatory system: Secondary | ICD-10-CM

## 2017-01-21 LAB — COMPREHENSIVE METABOLIC PANEL
ALK PHOS: 70 U/L (ref 38–126)
ALT: 21 U/L (ref 14–54)
ANION GAP: 11 (ref 5–15)
AST: 17 U/L (ref 15–41)
Albumin: 3.6 g/dL (ref 3.5–5.0)
BUN: 21 mg/dL — ABNORMAL HIGH (ref 6–20)
CALCIUM: 8.4 mg/dL — AB (ref 8.9–10.3)
CO2: 22 mmol/L (ref 22–32)
CREATININE: 1.25 mg/dL — AB (ref 0.44–1.00)
Chloride: 103 mmol/L (ref 101–111)
GFR, EST AFRICAN AMERICAN: 55 mL/min — AB (ref >60)
GFR, EST NON AFRICAN AMERICAN: 48 mL/min — AB (ref >60)
Glucose, Bld: 261 mg/dL — ABNORMAL HIGH (ref 65–99)
Potassium: 4.1 mmol/L (ref 3.5–5.1)
SODIUM: 136 mmol/L (ref 135–145)
Total Bilirubin: 0.7 mg/dL (ref 0.3–1.2)
Total Protein: 6.7 g/dL (ref 6.5–8.1)

## 2017-01-21 LAB — URINALYSIS, ROUTINE W REFLEX MICROSCOPIC
BILIRUBIN URINE: NEGATIVE
GLUCOSE, UA: NEGATIVE mg/dL
HGB URINE DIPSTICK: NEGATIVE
KETONES UR: NEGATIVE mg/dL
NITRITE: NEGATIVE
PH: 5 (ref 5.0–8.0)
Protein, ur: NEGATIVE mg/dL
SPECIFIC GRAVITY, URINE: 1.012 (ref 1.005–1.030)

## 2017-01-21 LAB — CBG MONITORING, ED
Glucose-Capillary: 179 mg/dL — ABNORMAL HIGH (ref 65–99)
Glucose-Capillary: 191 mg/dL — ABNORMAL HIGH (ref 65–99)

## 2017-01-21 LAB — CBC
HCT: 38.6 % (ref 36.0–46.0)
HEMOGLOBIN: 12.8 g/dL (ref 12.0–15.0)
MCH: 28.5 pg (ref 26.0–34.0)
MCHC: 33.2 g/dL (ref 30.0–36.0)
MCV: 86 fL (ref 78.0–100.0)
PLATELETS: 226 10*3/uL (ref 150–400)
RBC: 4.49 MIL/uL (ref 3.87–5.11)
RDW: 13.2 % (ref 11.5–15.5)
WBC: 3.8 10*3/uL — AB (ref 4.0–10.5)

## 2017-01-21 LAB — GLUCOSE, CAPILLARY: Glucose-Capillary: 191 mg/dL — ABNORMAL HIGH (ref 65–99)

## 2017-01-21 LAB — LIPASE, BLOOD: LIPASE: 35 U/L (ref 11–51)

## 2017-01-21 MED ORDER — PRAVASTATIN SODIUM 10 MG PO TABS
20.0000 mg | ORAL_TABLET | Freq: Every day | ORAL | Status: DC
  Administered 2017-01-21 – 2017-01-24 (×4): 20 mg via ORAL
  Filled 2017-01-21: qty 2
  Filled 2017-01-21: qty 1
  Filled 2017-01-21 (×2): qty 2

## 2017-01-21 MED ORDER — SODIUM CHLORIDE 0.9 % IV BOLUS (SEPSIS)
1000.0000 mL | Freq: Once | INTRAVENOUS | Status: AC
  Administered 2017-01-21: 1000 mL via INTRAVENOUS

## 2017-01-21 MED ORDER — DIPHENHYDRAMINE HCL 25 MG PO CAPS
25.0000 mg | ORAL_CAPSULE | Freq: Every evening | ORAL | Status: DC | PRN
  Administered 2017-01-23: 25 mg via ORAL
  Filled 2017-01-21: qty 1

## 2017-01-21 MED ORDER — GI COCKTAIL ~~LOC~~
30.0000 mL | Freq: Two times a day (BID) | ORAL | Status: DC | PRN
  Administered 2017-01-22: 30 mL via ORAL
  Filled 2017-01-21 (×2): qty 30

## 2017-01-21 MED ORDER — PROMETHAZINE HCL 25 MG/ML IJ SOLN
12.5000 mg | Freq: Once | INTRAMUSCULAR | Status: AC
  Administered 2017-01-21: 12.5 mg via INTRAVENOUS
  Filled 2017-01-21: qty 1

## 2017-01-21 MED ORDER — LEVOTHYROXINE SODIUM 100 MCG PO TABS
300.0000 ug | ORAL_TABLET | Freq: Every day | ORAL | Status: DC
  Administered 2017-01-21 – 2017-01-23 (×3): 300 ug via ORAL
  Filled 2017-01-21: qty 2
  Filled 2017-01-21 (×3): qty 3

## 2017-01-21 MED ORDER — INFLUENZA VAC SPLIT QUAD 0.5 ML IM SUSY
0.5000 mL | PREFILLED_SYRINGE | INTRAMUSCULAR | Status: AC
  Administered 2017-01-22: 0.5 mL via INTRAMUSCULAR
  Filled 2017-01-21: qty 0.5

## 2017-01-21 MED ORDER — ALBUTEROL SULFATE HFA 108 (90 BASE) MCG/ACT IN AERS: 2.0000 | INHALATION_SPRAY | RESPIRATORY_TRACT | Status: DC | PRN

## 2017-01-21 MED ORDER — MEROPENEM 1 G IV SOLR: 1.0000 g | Freq: Three times a day (TID) | INTRAVENOUS | Status: DC

## 2017-01-21 MED ORDER — INSULIN ASPART 100 UNIT/ML ~~LOC~~ SOLN
0.0000 [IU] | Freq: Three times a day (TID) | SUBCUTANEOUS | Status: DC
  Administered 2017-01-21 (×2): 3 [IU] via SUBCUTANEOUS
  Administered 2017-01-22 (×2): 5 [IU] via SUBCUTANEOUS
  Administered 2017-01-22 – 2017-01-23 (×2): 3 [IU] via SUBCUTANEOUS
  Administered 2017-01-23: 5 [IU] via SUBCUTANEOUS
  Administered 2017-01-23: 2 [IU] via SUBCUTANEOUS
  Administered 2017-01-24 (×2): 3 [IU] via SUBCUTANEOUS
  Filled 2017-01-21: qty 1

## 2017-01-21 MED ORDER — HYDROMORPHONE HCL 1 MG/ML IJ SOLN
1.0000 mg | Freq: Once | INTRAMUSCULAR | Status: AC
  Administered 2017-01-21: 1 mg via INTRAVENOUS
  Filled 2017-01-21: qty 1

## 2017-01-21 MED ORDER — ONDANSETRON 4 MG PO TBDP
4.0000 mg | ORAL_TABLET | Freq: Once | ORAL | Status: AC | PRN
  Administered 2017-01-21: 4 mg via ORAL

## 2017-01-21 MED ORDER — PNEUMOCOCCAL VAC POLYVALENT 25 MCG/0.5ML IJ INJ
0.5000 mL | INJECTION | INTRAMUSCULAR | Status: AC
  Administered 2017-01-22: 0.5 mL via INTRAMUSCULAR
  Filled 2017-01-21: qty 0.5

## 2017-01-21 MED ORDER — LEVOTHYROXINE SODIUM 150 MCG PO TABS
300.0000 ug | ORAL_TABLET | Freq: Every day | ORAL | Status: DC
  Filled 2017-01-21: qty 2

## 2017-01-21 MED ORDER — ENOXAPARIN SODIUM 40 MG/0.4ML ~~LOC~~ SOLN
40.0000 mg | SUBCUTANEOUS | Status: DC
  Administered 2017-01-22 – 2017-01-23 (×2): 40 mg via SUBCUTANEOUS
  Filled 2017-01-21 (×3): qty 0.4

## 2017-01-21 MED ORDER — DIPHENHYDRAMINE HCL 25 MG PO TABS
25.0000 mg | ORAL_TABLET | Freq: Every evening | ORAL | Status: DC | PRN
  Filled 2017-01-21: qty 1

## 2017-01-21 MED ORDER — ALBUTEROL SULFATE (2.5 MG/3ML) 0.083% IN NEBU: 2.5000 mg | INHALATION_SOLUTION | RESPIRATORY_TRACT | Status: DC | PRN

## 2017-01-21 MED ORDER — SODIUM CHLORIDE 0.9 % IV SOLN
1.0000 g | Freq: Three times a day (TID) | INTRAVENOUS | Status: DC
  Administered 2017-01-21 – 2017-01-23 (×7): 1 g via INTRAVENOUS
  Filled 2017-01-21 (×9): qty 1

## 2017-01-21 MED ORDER — METOPROLOL TARTRATE 100 MG PO TABS
100.0000 mg | ORAL_TABLET | Freq: Two times a day (BID) | ORAL | Status: DC
  Administered 2017-01-21 – 2017-01-24 (×7): 100 mg via ORAL
  Filled 2017-01-21 (×5): qty 1
  Filled 2017-01-21: qty 4
  Filled 2017-01-21: qty 1

## 2017-01-21 MED ORDER — CLOPIDOGREL BISULFATE 75 MG PO TABS
75.0000 mg | ORAL_TABLET | Freq: Every day | ORAL | Status: DC
  Administered 2017-01-21 – 2017-01-24 (×4): 75 mg via ORAL
  Filled 2017-01-21 (×4): qty 1

## 2017-01-21 MED ORDER — OMEGA-3-ACID ETHYL ESTERS 1 G PO CAPS
1.0000 g | ORAL_CAPSULE | Freq: Every day | ORAL | Status: DC
  Administered 2017-01-22 – 2017-01-24 (×3): 1 g via ORAL
  Filled 2017-01-21 (×4): qty 1

## 2017-01-21 MED ORDER — SODIUM CHLORIDE 0.9 % IV SOLN
INTRAVENOUS | Status: AC
  Administered 2017-01-21: 16:00:00 via INTRAVENOUS
  Administered 2017-01-21 – 2017-01-22 (×2): 150 mL via INTRAVENOUS

## 2017-01-21 MED ORDER — FUROSEMIDE 20 MG PO TABS: 80.0000 mg | ORAL_TABLET | Freq: Two times a day (BID) | ORAL | Status: DC

## 2017-01-21 MED ORDER — HYDROMORPHONE HCL 1 MG/ML IJ SOLN
1.0000 mg | INTRAMUSCULAR | Status: DC | PRN
  Administered 2017-01-21 – 2017-01-22 (×5): 1 mg via INTRAVENOUS
  Filled 2017-01-21 (×5): qty 1

## 2017-01-21 MED ORDER — ONDANSETRON 4 MG PO TBDP
ORAL_TABLET | ORAL | Status: AC
  Filled 2017-01-21: qty 1

## 2017-01-21 MED ORDER — ONDANSETRON HCL 4 MG/2ML IJ SOLN
4.0000 mg | Freq: Four times a day (QID) | INTRAMUSCULAR | Status: DC | PRN
  Administered 2017-01-21 – 2017-01-23 (×4): 4 mg via INTRAVENOUS
  Filled 2017-01-21 (×4): qty 2

## 2017-01-21 MED ORDER — PANTOPRAZOLE SODIUM 40 MG PO TBEC
40.0000 mg | DELAYED_RELEASE_TABLET | Freq: Every day | ORAL | Status: DC
  Administered 2017-01-21 – 2017-01-22 (×2): 40 mg via ORAL
  Filled 2017-01-21 (×2): qty 1

## 2017-01-21 MED ORDER — LISINOPRIL 10 MG PO TABS
10.0000 mg | ORAL_TABLET | Freq: Every day | ORAL | Status: DC
  Administered 2017-01-21 – 2017-01-24 (×3): 10 mg via ORAL
  Filled 2017-01-21 (×4): qty 1

## 2017-01-21 MED ORDER — CALCIUM CARBONATE-VITAMIN D 500-200 MG-UNIT PO TABS
2.0000 | ORAL_TABLET | Freq: Three times a day (TID) | ORAL | Status: DC
  Administered 2017-01-21 – 2017-01-24 (×8): 2 via ORAL
  Filled 2017-01-21 (×9): qty 2

## 2017-01-21 NOTE — H&P (Signed)
Date: 01/21/2017               Patient Name:  [name] MRN: [id number]  DOB: 08-29-1960 Age / Sex: 56 y.o., female   PCP: Lin Landsman, MD         Medical Service: Internal Medicine Teaching Service         Attending Physician: Dr. Evette Doffing, Mallie Mussel, *    First Contact: Dr. Tarri Abernethy Pager: [phone]  Second Contact: Dr. 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 She reports having epigastric abdominal pain and nausea for 4 days. States her abdominal pain became worse yesterday and she started vomiting. She has had 3 episodes of nonbloody, nonbilious emesis since yesterday. She describes her epigastric abdominal pain as constant, dull/ pressure-like and 9 out of 10 in intensity. Denies having any associated chest pain or shortness of breath. States she has tried over-the-counter Tylenol, ibuprofen and muscle rub but to no avail. Reports having GERD symptoms. Abdominal pain is not alleviated or exacerbated by eating. States her pain resolved after she received Dilaudid in the ED. Patient also reports having 3-4 episodes of loose stools per day since after a cholecystectomy in 1991. Denies having any constipation. Denies having any fevers but does state she has chills chronically for the past 2-3 years since after a total thyroidectomy. She denies having any dysuria. Reports having chronic urinary frequency which she attributes to diuretic use. Also reports having chronic urinary urgency for which she wears diapers.  Reports having chronic right flank pain since she was diagnosed with a renal mass. Per patient, she never had any urinary symptoms when she was diagnosed with UTIs in the past.   Meds:  Current Meds  Medication Sig  . acetaminophen (TYLENOL) 500 MG tablet Take 1,000 mg by mouth every 4 (four) hours as needed (body aches).  Marland Kitchen albuterol (PROVENTIL HFA;VENTOLIN HFA) 108 (90 BASE) MCG/ACT inhaler Inhale 2 puffs into the lungs every 4 (four) hours as needed for wheezing or shortness of breath.  . calcium-vitamin D (OSCAL WITH D) 500-200 MG-UNIT tablet Take 2 tablets by mouth 3 (three) times daily. (Patient taking differently: Take 2 tablets by mouth 2 (two) times daily. )  . clopidogrel (PLAVIX) 75 MG tablet Take 1 tablet (75 mg total) by mouth daily.  . diphenhydrAMINE (BENADRYL) 25 MG tablet Take 25 mg by mouth at bedtime.  . furosemide (LASIX) 40 MG tablet Take 2 tablets (80 mg total) by mouth 2 (two) times daily.  . lansoprazole (PREVACID) 15 MG capsule Take 1 capsule (15 mg total) by mouth daily at 12 noon.  Marland Kitchen levothyroxine (SYNTHROID, LEVOTHROID) 300 MCG tablet Take 300 mcg by mouth daily before breakfast.  . lisinopril (PRINIVIL,ZESTRIL) 10 MG tablet Take 10 mg by mouth daily.   . metFORMIN (GLUCOPHAGE) 1000 MG tablet Take 1 tablet (1,000 mg total) by mouth 2 (two) times daily with a meal.  . metoprolol (LOPRESSOR) 100 MG tablet Take 100 mg by mouth 2 (two) times daily.  . Omega-3 Fatty Acids (FISH OIL PO) Take 1 capsule by  mouth daily.  . ondansetron (ZOFRAN-ODT) 8 MG disintegrating tablet Take 8 mg by mouth 3 (three) times daily as needed for nausea or vomiting.  . phenazopyridine (PYRIDIUM) 200 MG tablet Take 1 tablet (200 mg total) by mouth 3 (three) times daily as needed for pain.  . potassium chloride SA (K-DUR,KLOR-CON) 20 MEQ tablet Take 40 mEq by mouth 2 (two) times daily.   . pravastatin (PRAVACHOL) 20 MG tablet Take 20 mg by mouth daily.   . Probiotic Product (PROBIOTIC PO) Take 1  tablet by mouth daily.  . promethazine (PHENERGAN) 25 MG tablet Take 1 tablet (25 mg total) by mouth every 6 (six) hours as needed for nausea or vomiting.  . sitaGLIPtin (JANUVIA) 100 MG tablet Take 100 mg by mouth daily.     Allergies: Allergies as of 01/21/2017 - Review Complete 01/21/2017  Allergen Reaction Noted  . Lorabid [loracarbef] Anaphylaxis 05/16/2012  . Augmentin [amoxicillin-pot clavulanate] Hives, Nausea And Vomiting, and Other (See Comments) 09/16/2015  . Codeine Nausea And Vomiting 06/04/2011  . Gadolinium derivatives Itching and Swelling 01/12/2017  . Sulfa drugs cross reactors Other (See Comments) 03/16/2011  . Benicar [olmesartan medoxomil] Swelling and Rash 03/16/2011  . Other Rash 04/12/2012   Past Medical History:  Diagnosis Date  . Anemia    hx of   . 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 Asthma    due to allergies   . Cancer ([place])   . Complication of anesthesia   . Diabetes mellitus   . Dysrhythmia    " irregular heart rate per patient"  . Family history of adverse reaction to anesthesia    difficult for father to wake after surgery  . Heart murmur   . Hypercholesteremia   . Hypertension   . PONV (postoperative nausea and vomiting)   . Thyroid disease   . TIA (transient ischemic attack)     Family History: Denies any ethanol, tobacco, or illicit drug use.  Social History: Mother had diabetes, TIAs, and MIs; now deceased.  Review of Systems: A complete ROS was negative except as per HPI.  Physical Exam: Blood pressure 107/90, pulse 72, temperature 98 F (36.7 C), temperature source Oral, resp. rate 15, SpO2 100 %. Physical Exam  Constitutional: She is oriented to person, place, and time. She appears well-developed and well-nourished. No distress.  HENT:  Head: Normocephalic and atraumatic.  Mouth/Throat: Oropharynx is clear and moist.  Eyes: Pupils are equal, round, and reactive to light. Right eye exhibits no discharge. Left eye exhibits no  discharge.  Neck: Neck supple. No tracheal deviation present.  Cardiovascular: Normal rate, regular rhythm and intact distal pulses.  Exam reveals no gallop and no friction rub.   Pulmonary/Chest: Effort normal and breath sounds normal. No respiratory distress. She has no wheezes. She has no rales.  Abdominal: Soft. Bowel sounds are normal. There is no tenderness. There is no guarding.  Very mild epigastric tenderness on deep palpation.  Musculoskeletal:  Trace pedal edema  Neurological: She is alert and oriented to person, place, and time.  Skin: Skin is warm and dry.    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   Abdominal pain, N/V: Differentials for her epigastric abdominal pain associated  with nausea and vomiting include GERD, peptic ulcer disease, dyspepsia, and viral gastroenteritis.  Patient is afebrile and does not have leukocytosis. Prior history of cholecystectomy. Lipase and LFTs normal. -Admit to medsurg -NS@150  -Dilaudid 1 mg every 3 hours as needed -IV Zofran 4 mg every 6 hours as needed -Protonix 40 mg daily  -GI cocktail BID prn   ? pyelonephritis: Patient has a history of ESBL Escherichia coli UTIs; most recent episode in April 2018. She does not seem to have any symptoms of cystitis and states she never experienced symptoms during her prior UTIs. She is afebrile and does not have leukocytosis. Reports having chronic right flank pain since she was diagnosed with a right-sided renal mass. UA showing negative nitrite, a large amount of leukocytes, and may bacteria (specimen is not clean catch). Creatinine 1.2, stable since about a month ago. ED  provider informed me that they spoke to infectious disease and decided to start the patient on antibiotic coverage for pyelonephritis based on prior sensitivities. -Continue meropenem for now; awaiting urine culture results  -Dilaudid 1 mg every 3 hours as needed -IV Zofran 4 mg every 6 hours as needed -BMP in am  Indeterminate right-sided renal lesion: Identified on abdominal CT performed in 11/28/2015 for workup of chronic abdominal pain. 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 Also showing nodular enhancement of a smaller mass in the right mid kidney; appearance compatible with a small renal cell carcinoma. No adenopathy, tumor thrombosis, or evidence of metastatic spread identified on imaging. Per review of interventional radiology note from 01/12/2017, patient was thought to be a poor operative candidate to undergo definitive resection. Plan is continued observation with follow-up abdominal MRI in 6 months (March 2019). -She will need to follow up with interventional radiology as outpatient  Well-controlled type 2 diabetes: A1c 6.9 in February 2018. CBG 261. -A1c pending  -Hold home metformin and sitagliptin at this time  -Moderate sliding scale insulin  Hypertension: Currently normotensive.  -Continue home Metoprolol 100 mg twice daily -Continue home Lisinopril 10 mg daily -Hold home Lasix 80 mg twice daily  Hyperlipidemia -Continue home pravastatin 20 mg daily  Asthma -Continue home albuterol inhaler every 4 hours as needed  History of TIAs: Followed by neurology. -Continue home Plavix 75 mg daily  Mild Leuukopenia: White count 3.8; was previously normal. HIV screen negative in February 2018. -Repeat CBC in a.m.  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 -Continue home levothyroxine  300 g daily -TSH pending   Diet: HH+CM DVT ppx: Lovenox  Dispo: Admit patient to Inpatient with expected length of stay greater than 2 midnights.  Signed: Shela Leff, MD 01/21/2017, 10:38 AM  Pager: [phone]

## 2017-01-21 NOTE — ED Provider Notes (Signed)
[facility] EMERGENCY DEPARTMENT Provider Note   CSN: [id number] Arrival date & time: 01/21/17  0408     History   Chief Complaint Chief Complaint  Patient presents with  . Abdominal Pain  . Emesis    HPI [name] is a 56 y.o. female with a history of ESBL E. coli UTIs, right papillary renal cell carcinoma, DM Type II, CAD, and gastroenteritis who presents to the emergency department with a chief complaint of constant epigastric pain that began 3 days ago, but significantly worsened yesterday with associated nausea and non-bloody, non-bilious emesis x2 last night.  She denies fever or chills, but states that she takes Tylenol and ibuprofen daily for her arthritis. She denies dyspnea, palpitations, or chest pain.   She has a h/o of chronic loose stools. No recent change.  No diarrhea, melena or hematochezia. Last BM was earlier tonight while having emesis. She reports daily BMs.   She reports a history of pyelonephritis and recurrent UTIs.  She denies dysuria, frequency, or hesitancy at this time, but states this is typical with her previous UTIs. She denies flank pain, vaginal bleeding, discharge, pain, or itching. No h/o of nephrolithiasis. She reports a h/o of right-sided back pain that has not changed since the onset of her symptoms.   Surgical history includes thyroidectomy, cholecystectomy, and hysterectomy.  She was recently diagnosed with right papillary renal cell carcinoma and was informed she is not a surgical candidate, but is scheduled to undergo radiation soon.  The history is provided by the patient. No language interpreter was used.    Past Medical History:  Diagnosis Date  . Anemia    hx of   . 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 Asthma    due to allergies   . Cancer ([place])   . Complication of anesthesia   . Diabetes mellitus   . Dysrhythmia    " irregular heart rate per patient"  . Family history of adverse reaction to anesthesia    difficult for father to wake after surgery  . Heart murmur   . Hypercholesteremia   . Hypertension   . PONV (postoperative nausea and vomiting)   . Thyroid disease   . TIA (transient ischemic attack)     Patient Active Problem List   Diagnosis Date Noted  . Abdominal pain 01/21/2017  . Stroke-like symptoms 05/16/2016  . 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 Fatty liver disease, nonalcoholic 96/75/9163  . Slurred speech 09/13/2015  . Parotitis 09/13/2015  . Gastroenteritis 08/21/2015  . Hypothyroid 08/21/2015  . 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 Pain in the chest   . Essential hypertension   . Thoracic aortic aneurysm ([place])   . Chest pain 04/23/2014  . Anginal pain ([place]) 04/23/2014  . Type 2 diabetes mellitus ([place])   . Hypertension   . Hypercholesteremia     Past Surgical History:  Procedure Laterality Date  . ABDOMINAL HYSTERECTOMY    . CHOLECYSTECTOMY    . COLON SURGERY    . KNEE ARTHROSCOPY    . LEFT HEART CATHETERIZATION WITH CORONARY ANGIOGRAM N/A 04/24/2014   Procedure: LEFT HEART CATHETERIZATION WITH CORONARY ANGIOGRAM;  Surgeon: Burnell Blanks, MD;  Location: [facility] CATH LAB;  Service: Cardiovascular;  Laterality: N/A;  . THYROIDECTOMY N/A 09/20/2014   Procedure: TOTAL THYROIDECTOMY;  Surgeon: Armandina Gemma, MD;  Location: [facility] ORS;  Service: General;  Laterality: N/A;  . TONSILLECTOMY  OB History    No data available       Home Medications    Prior to Admission medications   Medication Sig Start Date End Date Taking? Authorizing Provider  acetaminophen (TYLENOL) 500 MG tablet Take 1,000 mg by mouth every 4 (four) hours as needed (body aches).   Yes [provider]  albuterol (PROVENTIL HFA;VENTOLIN HFA) 108 (90 BASE) MCG/ACT inhaler Inhale 2 puffs into the lungs every 4 (four) hours as needed for wheezing or shortness of breath. 09/07/13  Yes Leeanne Rio,  MD  calcium-vitamin D (OSCAL WITH D) 500-200 MG-UNIT tablet Take 2 tablets by mouth 3 (three) times daily. Patient taking differently: Take 2 tablets by mouth 2 (two) times daily.  05/17/16  Yes Geradine Girt, DO  clopidogrel (PLAVIX) 75 MG tablet Take 1 tablet (75 mg total) by mouth daily. 09/15/15  Yes Florencia Reasons, MD  diphenhydrAMINE (BENADRYL) 25 MG tablet Take 25 mg by mouth at bedtime.   Yes [provider]  furosemide (LASIX) 40 MG tablet Take 2 tablets (80 mg total) by mouth 2 (two) times daily. 05/17/16  Yes Eulogio Bear U, DO  lansoprazole (PREVACID) 15 MG capsule Take 1 capsule (15 mg total) by mouth daily at 12 noon. 03/07/15  Yes Little, Wenda Overland, MD  levothyroxine (SYNTHROID, LEVOTHROID) 300 MCG tablet Take 300 mcg by mouth daily before breakfast.   Yes [provider]  lisinopril (PRINIVIL,ZESTRIL) 10 MG tablet Take 10 mg by mouth daily.  09/09/14  Yes [provider]  metFORMIN (GLUCOPHAGE) 1000 MG tablet Take 1 tablet (1,000 mg total) by mouth 2 (two) times daily with a meal. 04/29/14  Yes Mikhail, [place], DO  metoprolol (LOPRESSOR) 100 MG tablet Take 100 mg by mouth 2 (two) times daily. 06/23/16 06/23/17 Yes [provider]  Omega-3 Fatty Acids (FISH OIL PO) Take 1 capsule by mouth daily.   Yes [provider]  ondansetron (ZOFRAN-ODT) 8 MG disintegrating tablet Take 8 mg by mouth 3 (three) times daily as needed for nausea or vomiting.   Yes [provider]  phenazopyridine (PYRIDIUM) 200 MG tablet Take 1 tablet (200 mg total) by mouth 3 (three) times daily as needed for pain. 07/06/16  Yes Mesner, Corene Cornea, MD  potassium chloride SA (K-DUR,KLOR-CON) 20 MEQ tablet Take 40 mEq by mouth 2 (two) times daily.    Yes [provider]  pravastatin (PRAVACHOL) 20 MG tablet Take 20 mg by mouth daily.  06/25/13  Yes [provider]  Probiotic Product (PROBIOTIC PO) Take 1 tablet by mouth daily.   Yes [provider]    promethazine (PHENERGAN) 25 MG tablet Take 1 tablet (25 mg total) by mouth every 6 (six) hours as needed for nausea or vomiting. 10/11/15  Yes Virgel Manifold, MD  sitaGLIPtin (JANUVIA) 100 MG tablet Take 100 mg by mouth daily.   Yes [provider]    Family History Family History  Problem Relation Age of Onset  . Heart failure Mother   . Diabetes Mother   . Stroke Father   . Heart failure Father   . Cancer Father     Social History Social History  Substance Use Topics  . Smoking status: Never Smoker  . Smokeless tobacco: Never Used  . Alcohol use No     Allergies   Lorabid [loracarbef]; Augmentin [amoxicillin-pot clavulanate]; Codeine; Gadolinium derivatives; Sulfa drugs cross reactors; Benicar [olmesartan medoxomil]; and Other   Review of Systems Review of Systems  Constitutional: Negative for  activity change.  Respiratory: Negative for cough and shortness of breath.   Cardiovascular: Negative for chest pain.  Gastrointestinal: Positive for abdominal pain, nausea and vomiting. Negative for abdominal distention, anal bleeding, blood in stool, constipation and rectal pain.  Genitourinary: Negative for dysuria, flank pain, frequency, hematuria, urgency, vaginal bleeding, vaginal discharge and vaginal pain.  Musculoskeletal: Negative for back pain.  Skin: Negative for rash.   Physical Exam Updated Vital Signs BP 107/90   Pulse 72   Temp 98 F (36.7 C) (Oral)   Resp 15   SpO2 100%   Physical Exam  Constitutional: No distress.  Pleasantly obese female.  HENT:  Head: Normocephalic.  Eyes: Conjunctivae are normal.  Neck: Neck supple.  Cardiovascular: Normal rate, regular rhythm and normal heart sounds.  Exam reveals no gallop and no friction rub.   No murmur heard. Pulmonary/Chest: Effort normal and breath sounds normal. No respiratory distress. She has no wheezes. She has no rales.  Abdominal: Soft. She exhibits no distension. There is tenderness.  Tender  to palpation over the epigastric and right upper quadrant.  Negative Murphy sign.  No tenderness to the bilateral lower quadrants.  CVA tenderness is present on the right.  No left-sided CVA tenderness.  Musculoskeletal: She exhibits no tenderness or deformity.  No edema to the bilateral lower extremities. DP and PT pulses are 2+ and symmetric.  Neurological: She is alert.  Skin: Skin is warm. Capillary refill takes less than 2 seconds. No rash noted. She is not diaphoretic.  Psychiatric: Her behavior is normal.  Nursing note and vitals reviewed.    ED Treatments / Results  Labs (all labs ordered are listed, but only abnormal results are displayed) Labs Reviewed  COMPREHENSIVE METABOLIC PANEL - Abnormal; Notable for the following:       Result Value   Glucose, Bld 261 (*)    BUN 21 (*)    Creatinine, Ser 1.25 (*)    Calcium 8.4 (*)    GFR calc non Af Amer 48 (*)    GFR calc Af Amer 55 (*)    All other components within normal limits  CBC - Abnormal; Notable for the following:    WBC 3.8 (*)    All other components within normal limits  URINALYSIS, ROUTINE W REFLEX MICROSCOPIC - Abnormal; Notable for the following:    APPearance CLOUDY (*)    Leukocytes, UA LARGE (*)    Bacteria, UA MANY (*)    Squamous Epithelial / LPF 0-5 (*)    All other components within normal limits  URINE CULTURE  LIPASE, BLOOD    EKG  EKG Interpretation None       Radiology No results found.  Procedures Procedures (including critical care time)  Medications Ordered in ED Medications  meropenem (MERREM) 1 g in sodium chloride 0.9 % 100 mL IVPB (1 g Intravenous New Bag/Given 01/21/17 1000)  HYDROmorphone (DILAUDID) injection 1 mg (not administered)  metoprolol tartrate (LOPRESSOR) tablet 100 mg (not administered)  calcium-vitamin D (OSCAL WITH D) 500-200 MG-UNIT per tablet 2 tablet (not administered)  furosemide (LASIX) tablet 80 mg (not administered)  omega-3 acid ethyl esters (LOVAZA)  capsule 1 g (not administered)  diphenhydrAMINE (BENADRYL) tablet 25 mg (not administered)  clopidogrel (PLAVIX) tablet 75 mg (not administered)  levothyroxine (SYNTHROID, LEVOTHROID) tablet 300 mcg (not administered)  pantoprazole (PROTONIX) EC tablet 40 mg (not administered)  lisinopril (PRINIVIL,ZESTRIL) tablet 10 mg (not administered)  albuterol (PROVENTIL HFA;VENTOLIN HFA) 108 (90 Base) MCG/ACT inhaler 2 puff (  not administered)  pravastatin (PRAVACHOL) tablet 20 mg (not administered)  enoxaparin (LOVENOX) injection 40 mg (not administered)  insulin aspart (novoLOG) injection 0-15 Units (not administered)  ondansetron (ZOFRAN) injection 4 mg (not administered)  0.9 %  sodium chloride infusion (not administered)  ondansetron (ZOFRAN-ODT) disintegrating tablet 4 mg (4 mg Oral Given 01/21/17 0423)  sodium chloride 0.9 % bolus 1,000 mL (0 mLs Intravenous Stopped 01/21/17 0915)  promethazine (PHENERGAN) injection 12.5 mg (12.5 mg Intravenous Given 01/21/17 0805)  HYDROmorphone (DILAUDID) injection 1 mg (1 mg Intravenous Given 01/21/17 0804)     Initial Impression / Assessment and Plan / ED Course  I have reviewed the triage vital signs and the nursing notes.  Pertinent labs & imaging results that were available during my care of the patient were reviewed by me and considered in my medical decision making (see chart for details).     56 year old female with a history of ESBL E. coli UTIs and pyelonephritis and right papillary renal cell carcinoma with epigastric and right upper quadrant abdominal pain, NBNB nausea, emesis x2. UA with WBC clumps, leukocytes, and many bacteria. Urine culture pending.  The patient was discussed and evaluated with Dr. Alvino Chapel, attending physician. Phenergan given in the ED with improvement of her nausea.  Morphine given for pain control.  The patient is allergic to sulfa antibiotics, and after a shared decision-making conversation, is unwilling to undergo a  trial of Bactrim while observed in the ED. Consulted ID and spoke with Dr. Baxter Flattery regarding the patient's antibiotic allergies based on her most recent urine culture and sensitivities, who recommended inpatient admission for IV antibiotics since the PO antibiotics she is not allergic to do not penetrate the kidneys well. Consulted unassigned medicine and spoke with Dr. Marlowe Sax who will admit the patient for IV antibiotics. Spoke with Ebony Hail in pharmacy who recommended Meropenem based on the patient's previous sensitivities. The patient appears reasonably stabilized for admission considering the current resources, flow, and capabilities available in the ED at this time, and I doubt any other [facility] requiring further screening and/or treatment in the ED prior to admission.  Final Clinical Impressions(s) / ED Diagnoses   Final diagnoses:  Pyelonephritis  Epigastric pain    New Prescriptions New Prescriptions   No medications on file     Joanne Gavel, PA-C 01/21/17 [place], [place] Hudman A, PA-C 01/21/17 1046    Davonna Belling, MD 01/21/17 1530

## 2017-01-21 NOTE — ED Notes (Signed)
Urine culture add on requisition sent to main lab

## 2017-01-21 NOTE — Progress Notes (Signed)
1630 Received pt from ED, A&O x4. Denies abd pain but c/o nausea. Abd soft, non-tender.

## 2017-01-21 NOTE — ED Notes (Signed)
Attempted to call report

## 2017-01-21 NOTE — ED Triage Notes (Signed)
Patient with nausea, vomiting and abdominal pain, states she is feeling chills on and off.  She took some ibuprofen around 2am which helped with the chills.  This has been going on for a few days.

## 2017-01-21 NOTE — ED Notes (Signed)
Dr. Marlowe Sax sent page regarding why pts synthroid was d/ced as patient states she takes this medication daily.

## 2017-01-21 NOTE — ED Notes (Signed)
Admitting dr at bedside.  

## 2017-01-21 NOTE — ED Notes (Signed)
Pt received lunch meal tray.

## 2017-01-22 DIAGNOSIS — B9689 Other specified bacterial agents as the cause of diseases classified elsewhere: Secondary | ICD-10-CM

## 2017-01-22 DIAGNOSIS — Z8619 Personal history of other infectious and parasitic diseases: Secondary | ICD-10-CM

## 2017-01-22 LAB — BASIC METABOLIC PANEL
ANION GAP: 8 (ref 5–15)
BUN: 10 mg/dL (ref 6–20)
CALCIUM: 7.8 mg/dL — AB (ref 8.9–10.3)
CO2: 22 mmol/L (ref 22–32)
CREATININE: 0.89 mg/dL (ref 0.44–1.00)
Chloride: 110 mmol/L (ref 101–111)
Glucose, Bld: 170 mg/dL — ABNORMAL HIGH (ref 65–99)
Potassium: 3.2 mmol/L — ABNORMAL LOW (ref 3.5–5.1)
SODIUM: 140 mmol/L (ref 135–145)

## 2017-01-22 LAB — HEMOGLOBIN A1C
Hgb A1c MFr Bld: 7.3 % — ABNORMAL HIGH (ref 4.8–5.6)
Mean Plasma Glucose: 162.81 mg/dL

## 2017-01-22 LAB — GLUCOSE, CAPILLARY
GLUCOSE-CAPILLARY: 157 mg/dL — AB (ref 65–99)
GLUCOSE-CAPILLARY: 165 mg/dL — AB (ref 65–99)
Glucose-Capillary: 148 mg/dL — ABNORMAL HIGH (ref 65–99)
Glucose-Capillary: 211 mg/dL — ABNORMAL HIGH (ref 65–99)
Glucose-Capillary: 230 mg/dL — ABNORMAL HIGH (ref 65–99)

## 2017-01-22 LAB — CBC
HCT: 33.6 % — ABNORMAL LOW (ref 36.0–46.0)
HEMOGLOBIN: 10.7 g/dL — AB (ref 12.0–15.0)
MCH: 27.6 pg (ref 26.0–34.0)
MCHC: 31.8 g/dL (ref 30.0–36.0)
MCV: 86.8 fL (ref 78.0–100.0)
PLATELETS: 198 10*3/uL (ref 150–400)
RBC: 3.87 MIL/uL (ref 3.87–5.11)
RDW: 13.8 % (ref 11.5–15.5)
WBC: 5.6 10*3/uL (ref 4.0–10.5)

## 2017-01-22 LAB — TSH

## 2017-01-22 MED ORDER — PANTOPRAZOLE SODIUM 40 MG PO TBEC
40.0000 mg | DELAYED_RELEASE_TABLET | Freq: Two times a day (BID) | ORAL | Status: DC
  Administered 2017-01-22 – 2017-01-24 (×4): 40 mg via ORAL
  Filled 2017-01-22 (×4): qty 1

## 2017-01-22 MED ORDER — POTASSIUM CHLORIDE CRYS ER 20 MEQ PO TBCR
40.0000 meq | EXTENDED_RELEASE_TABLET | Freq: Once | ORAL | Status: AC
  Administered 2017-01-22: 40 meq via ORAL
  Filled 2017-01-22: qty 2

## 2017-01-22 NOTE — Progress Notes (Signed)
   Subjective: Doing well this AM, was able to eat chicken noodle soup last night without any further N/V. States that she typically has N/V with her UTIs versus the traditional dysuria and increased frequency. She does have some lower abdominal cramping today. She states that last time she was treated for a UTI she was treated with 10 days of Macrobid. She is concerned her pain is coming from her renal mass. All questions and concerns addressed.   Objective: Vital signs in last 24 hours: Vitals:   01/21/17 1634 01/21/17 2037 01/21/17 2050 01/22/17 0435  BP: (!) 117/54 101/77  (!) 104/57  Pulse: 69 65  63  Resp: 18 20  19   Temp: 98.2 F (36.8 C) 98.3 F (36.8 C)  98.4 F (36.9 C)  TempSrc: Oral Oral  Oral  SpO2: 99% 91% 100% 98%  Weight: 243 lb 6.2 oz (110.4 kg)     Height: 5\' 6"  (1.676 m)      General: Obese female resting comfortably in bed  Pulm: Good air movement with no crackles or wheezing  CV: RRR, no murmurs, no rubs Abdomen: Soft, non-distended, no tenderness to palpation  Extremities: No LE edema   Assessment/Plan:  1. Abdominal Pain with associated N/V  - DDx: GERD/Gastritis vs Symptomatic UTI - Patient continues to be clinically stable and afebrile  - Pyuria and bacteruria on urine analysis. Patient states she has chronic urgency and frequency, but does seem to have N/V when she is diagnosed with a UTI. Symptoms typically subside with treatment of the UTI. Multiple previous UTIs with ESBL E. Coli. I wonder if she has a urethral diverticulum with her recurrent UTIs. It does not appear she has ever had a urological work-up for this issue.  - Tolerating oral intake  - Nausea and pain control with Zofran and Dilaudid  - On Protonix 40 mg QD and GI cocktail BID prn. Will increase Protonix to 40 mg BID. - Continue Meropenem until urine cultures return (currently >100K gram negative rods)  2. Right Renal Lesion  - Identified by Abdominal CT on 11/28/2015 and evaluated with  Abdominal MRI on 12/21/2015 that showed findings compatible with papillary renal cell carcinoma.  - Patient follows with Urology and IR - Currently doing active surveillance   3. Type 2 DM, controlled - Most recent A1c 6.9, repeat pending  - Holding home metformin and sitagliptin  - Moderate SSI while in the hospital   4. Hypertension  - Continuing home Metoprolol 100 mg BID and Lisinopril 10 mg QD  - Will restart her home Lasix 80 mg BID  Dispo: Anticipated discharge in approximately >1 day(s).   Ina Homes, MD 01/22/2017, 5:29 AM My Pager: [phone]

## 2017-01-23 DIAGNOSIS — R112 Nausea with vomiting, unspecified: Secondary | ICD-10-CM

## 2017-01-23 DIAGNOSIS — R1030 Lower abdominal pain, unspecified: Secondary | ICD-10-CM

## 2017-01-23 DIAGNOSIS — Z79899 Other long term (current) drug therapy: Secondary | ICD-10-CM

## 2017-01-23 DIAGNOSIS — N289 Disorder of kidney and ureter, unspecified: Secondary | ICD-10-CM

## 2017-01-23 DIAGNOSIS — E669 Obesity, unspecified: Secondary | ICD-10-CM

## 2017-01-23 DIAGNOSIS — R8271 Bacteriuria: Secondary | ICD-10-CM

## 2017-01-23 DIAGNOSIS — E119 Type 2 diabetes mellitus without complications: Secondary | ICD-10-CM

## 2017-01-23 DIAGNOSIS — I10 Essential (primary) hypertension: Secondary | ICD-10-CM

## 2017-01-23 LAB — URINE CULTURE

## 2017-01-23 LAB — BASIC METABOLIC PANEL
ANION GAP: 8 (ref 5–15)
BUN: 8 mg/dL (ref 6–20)
CHLORIDE: 108 mmol/L (ref 101–111)
CO2: 21 mmol/L — AB (ref 22–32)
Calcium: 8.2 mg/dL — ABNORMAL LOW (ref 8.9–10.3)
Creatinine, Ser: 0.85 mg/dL (ref 0.44–1.00)
GFR calc Af Amer: >60 mL/min (ref >60)
GFR calc non Af Amer: >60 mL/min (ref >60)
GLUCOSE: 194 mg/dL — AB (ref 65–99)
POTASSIUM: 4 mmol/L (ref 3.5–5.1)
Sodium: 137 mmol/L (ref 135–145)

## 2017-01-23 LAB — GLUCOSE, CAPILLARY
GLUCOSE-CAPILLARY: 210 mg/dL — AB (ref 65–99)
GLUCOSE-CAPILLARY: 163 mg/dL — AB (ref 65–99)
GLUCOSE-CAPILLARY: 211 mg/dL — AB (ref 65–99)
Glucose-Capillary: 141 mg/dL — ABNORMAL HIGH (ref 65–99)

## 2017-01-23 MED ORDER — MICONAZOLE NITRATE 200 MG VA SUPP
200.0000 mg | Freq: Every day | VAGINAL | Status: DC
  Administered 2017-01-23: 200 mg via VAGINAL
  Filled 2017-01-23: qty 3

## 2017-01-23 MED ORDER — CEPHALEXIN 500 MG PO CAPS
500.0000 mg | ORAL_CAPSULE | Freq: Two times a day (BID) | ORAL | Status: DC
  Administered 2017-01-23 – 2017-01-24 (×3): 500 mg via ORAL
  Filled 2017-01-23 (×3): qty 1

## 2017-01-23 MED ORDER — MICONAZOLE NITRATE 2 % EX CREA
TOPICAL_CREAM | Freq: Two times a day (BID) | CUTANEOUS | Status: DC
  Administered 2017-01-23: 1 via TOPICAL
  Administered 2017-01-23 – 2017-01-24 (×2): via TOPICAL
  Filled 2017-01-23: qty 14

## 2017-01-23 MED ORDER — FUROSEMIDE 80 MG PO TABS
80.0000 mg | ORAL_TABLET | Freq: Every day | ORAL | Status: DC
  Administered 2017-01-24: 80 mg via ORAL
  Filled 2017-01-23: qty 1

## 2017-01-23 MED ORDER — FUROSEMIDE 80 MG PO TABS
80.0000 mg | ORAL_TABLET | Freq: Two times a day (BID) | ORAL | Status: DC
  Administered 2017-01-23: 80 mg via ORAL
  Filled 2017-01-23: qty 1

## 2017-01-23 MED ORDER — FUROSEMIDE 80 MG PO TABS: 80.0000 mg | ORAL_TABLET | Freq: Two times a day (BID) | ORAL | Status: DC

## 2017-01-23 MED ORDER — OXYCODONE-ACETAMINOPHEN 5-325 MG PO TABS
1.0000 | ORAL_TABLET | Freq: Four times a day (QID) | ORAL | Status: DC | PRN
  Administered 2017-01-23 – 2017-01-24 (×3): 1 via ORAL
  Filled 2017-01-23 (×3): qty 2

## 2017-01-23 NOTE — Progress Notes (Signed)
   Subjective:  Patient evaluated this morning.  She states that she is tolerating liquids and feels like she has had plenty of fluids.  She does report nausea with solid foods but overall reports her symptoms are improving.    Objective: Vital signs in last 24 hours: Vitals:   01/22/17 1041 01/22/17 1334 01/22/17 2131 01/23/17 0453  BP: 122/73 (!) 150/65 (!) 152/61 (!) 131/59  Pulse: 68 (!) 56 (!) 56 (!) 56  Resp:  17 18   Temp:  98.3 F (36.8 C) 98.5 F (36.9 C) 98.1 F (36.7 C)  TempSrc:  Oral Oral Oral  SpO2: 98% 98% 100% 99%  Weight:      Height:       General: Obese female resting comfortably in bed  Pulm: Good air movement with no crackles or wheezing  CV: RRR, no murmurs, no rubs Abdomen: Soft, non-distended, mild tenderness to suprapubic region Extremities: No LE edema   Assessment/Plan:  1. Abdominal Pain with associated N/V:  Patient continues to be clinically stable and afebrile.  Currently tolerating oral intake. Patient had pyuria and bacteriuria in urine analysis.  Urine culture growing >100,000 colonies of klebsiella pneumoniae.  Currently on Meropenem, awaiting sensitivities.  - Nausea and pain control with Zofran and Dilaudid  - On Protonix 40 mg BID and GI cocktail BID prn.  - Continue Meropenem   2. Right Renal Lesion:  Identified by Abdominal CT on 11/28/2015 and evaluated with Abdominal MRI on 12/21/2015 that showed findings compatible with papillary renal cell carcinoma. Patient follows with Urology and IR.  Currently doing active surveillance.   3. Type 2 DM, controlled  Most recent A1c 7.3, obtained inpatient.  Previously 6.9. - Moderate SSI while in the hospital   4. Hypertension:  Blood pressure currently stable.  Continuing home Metoprolol 100 mg BID and Lisinopril 10 mg QD.  Restarting home lasix at half, 80mg  daily. - home BP meds - continue to monitor  Dispo: Anticipated discharge pending clinical improvement.   Kalman Shan [place],  DO 01/23/2017, 8:12 AM My Pager: [phone]

## 2017-01-24 DIAGNOSIS — N390 Urinary tract infection, site not specified: Secondary | ICD-10-CM

## 2017-01-24 DIAGNOSIS — Z1619 Resistance to other specified beta lactam antibiotics: Secondary | ICD-10-CM

## 2017-01-24 DIAGNOSIS — B961 Klebsiella pneumoniae [K. pneumoniae] as the cause of diseases classified elsewhere: Secondary | ICD-10-CM

## 2017-01-24 DIAGNOSIS — Z91018 Allergy to other foods: Secondary | ICD-10-CM

## 2017-01-24 LAB — GLUCOSE, CAPILLARY
GLUCOSE-CAPILLARY: 171 mg/dL — AB (ref 65–99)
Glucose-Capillary: 200 mg/dL — ABNORMAL HIGH (ref 65–99)

## 2017-01-24 MED ORDER — MICONAZOLE NITRATE 2 % EX CREA: TOPICAL_CREAM | Freq: Two times a day (BID) | CUTANEOUS | 0 refills | Status: DC

## 2017-01-24 MED ORDER — MICONAZOLE NITRATE 200 MG VA SUPP: 200.0000 mg | Freq: Every day | VAGINAL | 0 refills | Status: DC

## 2017-01-24 MED ORDER — CEPHALEXIN 500 MG PO CAPS: 500.0000 mg | ORAL_CAPSULE | Freq: Two times a day (BID) | ORAL | 0 refills | Status: DC

## 2017-01-24 NOTE — Progress Notes (Signed)
   Subjective: Doing well this AM. Able to tolerate oral intake. Able to ambulate without difficulty. Is asking about going home today. Discussed that we can treat her UTI with oral medication and discharge home today. All questions and concerns addressed.   Objective: Vital signs in last 24 hours: Vitals:   01/23/17 0938 01/23/17 1340 01/23/17 2206 01/24/17 0603  BP: (!) 113/53 (!) 125/59 (!) 141/70 132/62  Pulse: 65 63 (!) 59 60  Resp:  18 17   Temp:  98.6 F (37 C) 98.6 F (37 C) 98 F (36.7 C)  TempSrc:  Oral    SpO2:  100% 98% 99%  Weight:      Height:       General: Obese female sitting up in bed  Pulm: Good air movement, no wheezing or crackles today  CV: RRR, no murmurs, no rubs  Abdomen: Active bowel sounds, soft, no tenderness to palpation  Extremities: Trace swelling of the LE bilaterally   Assessment/Plan:  1. Abdominal Pain with associated N/V:   - Patient continues to be clinically stable and afebrile.   - Patient had pyuria and bacteriuria in urine analysis.  Urine culture growing >100,000 colonies of klebsiella pneumoniae. Switched to Keflex 500 mg BID. Will complete 5 days of total antibiotics  - Nausea and pain control with Zofran and Dilaudid  - On Protonix 40 mg BID and GI cocktail BID prn.   2. Right Renal Lesion:  - Identified by Abdominal CT on 11/28/2015 and evaluated with Abdominal MRI on 12/21/2015 that showed findings compatible with papillary renal cell carcinoma.  - Patient follows with Urology and IR.  - Currently doing active surveillance.   3. Type 2 DM, controlled - Most recent A1c 7.3, obtained inpatient.  Previously 6.9. - Moderate SSI while in the hospital   4. Hypertension:   - Blood pressure currently stable.   - Continuing home Metoprolol 100 mg BID and Lisinopril 10 mg QD. - Furosemide 80 mg QD started yesterday. Restart home dose on discharge.   Dispo: Anticipated discharge in approximately 0 day(s).   Ina Homes,  MD 01/24/2017, 6:21 AM My Pager: [phone]

## 2017-01-24 NOTE — Discharge Instructions (Signed)
Thank you for allowing [place] to provide your care. Please do the following:  - Pick up your Keflex prescription and take it as prescribed   - Schedule a follow-up appointment with your primary care doctor to discuss prevention of these recurrent urinary tract infections   - Continue to take all your other medications as prescribed.

## 2017-01-24 NOTE — Discharge Summary (Signed)
Name: [name] MRN: [id number] DOB: [birth year] 56 y.o. PCP: Lin Landsman, MD  Date of Admission: 01/21/2017  5:43 AM Date of Discharge: 01/24/2017 Attending Physician: Axel Filler, *  Discharge Diagnosis: 1. Urinary Tract Infection  2. Abdominal pain with N/V   Principal Problem:   Urinary tract infection Active Problems:   Abdominal pain  Discharge Medications: Allergies as of 01/24/2017      Reactions   Lorabid [loracarbef] Anaphylaxis   Augmentin [amoxicillin-pot Clavulanate] Hives, Nausea And Vomiting, Other (See Comments)   Chest pain   Codeine Nausea And Vomiting   Gadolinium Derivatives Itching, Swelling   Patient will require 13 hr prep prior to administration of gadolinium     Sulfa Drugs Cross Reactors Other (See Comments)   Severe allergic reaction as a child - was not told symptoms   Benicar [olmesartan Medoxomil] Swelling, Rash   Other Rash   Green peas      Medication List    TAKE these medications   acetaminophen 500 MG tablet Commonly known as:  TYLENOL Take 1,000 mg by mouth every 4 (four) hours as needed (body aches).   albuterol 108 (90 Base) MCG/ACT inhaler Commonly known as:  PROVENTIL HFA;VENTOLIN HFA Inhale 2 puffs into the lungs every 4 (four) hours as needed for wheezing or shortness of breath.   calcium-vitamin D 500-200 MG-UNIT tablet Commonly known as:  OSCAL WITH D Take 2 tablets by mouth 3 (three) times daily. What changed:  when to take this   cephALEXin 500 MG capsule Commonly known as:  KEFLEX Take 1 capsule (500 mg total) by mouth every 12 (twelve) hours.   clopidogrel 75 MG tablet Commonly known as:  PLAVIX Take 1 tablet (75 mg total) by mouth daily.   diphenhydrAMINE 25 MG tablet Commonly known as:  BENADRYL Take 25 mg by mouth at bedtime.   FISH OIL PO Take 1 capsule by mouth daily.   furosemide 40 MG tablet Commonly known as:  LASIX Take 2 tablets (80 mg total) by mouth 2 (two) times daily.     lansoprazole 15 MG capsule Commonly known as:  PREVACID Take 1 capsule (15 mg total) by mouth daily at 12 noon.   levothyroxine 300 MCG tablet Commonly known as:  SYNTHROID, LEVOTHROID Take 300 mcg by mouth daily before breakfast.   lisinopril 10 MG tablet Commonly known as:  PRINIVIL,ZESTRIL Take 10 mg by mouth daily.   metFORMIN 1000 MG tablet Commonly known as:  GLUCOPHAGE Take 1 tablet (1,000 mg total) by mouth 2 (two) times daily with a meal.   metoprolol tartrate 100 MG tablet Commonly known as:  LOPRESSOR Take 100 mg by mouth 2 (two) times daily.   miconazole 2 % cream Commonly known as:  MICOTIN Apply topically 2 (two) times daily.   miconazole 200 MG vaginal suppository Commonly known as:  MICOTIN Place 1 suppository (200 mg total) vaginally at bedtime.   ondansetron 8 MG disintegrating tablet Commonly known as:  ZOFRAN-ODT Take 8 mg by mouth 3 (three) times daily as needed for nausea or vomiting.   phenazopyridine 200 MG tablet Commonly known as:  PYRIDIUM Take 1 tablet (200 mg total) by mouth 3 (three) times daily as needed for pain.   potassium chloride SA 20 MEQ tablet Commonly known as:  K-DUR,KLOR-CON Take 40 mEq by mouth 2 (two) times daily.   pravastatin 20 MG tablet Commonly known as:  PRAVACHOL Take 20 mg by mouth daily.   PROBIOTIC PO Take 1 tablet  by mouth daily.   promethazine 25 MG tablet Commonly known as:  PHENERGAN Take 1 tablet (25 mg total) by mouth every 6 (six) hours as needed for nausea or vomiting.   sitaGLIPtin 100 MG tablet Commonly known as:  JANUVIA Take 100 mg by mouth daily.      Disposition and follow-up:   [name] was discharged from [facility] in Stable condition.  At the hospital follow up visit please address:  1.  Urinary Tract Infection. Given [name] complicated history of recurrent urinary tract infections, she may benefit from further evaluation by a urologist to look for a  cause. She does fit criteria for antimicrobial prophylaxis however given her history of ESBL E. Coli infections this is difficult. I would recommend evaluation by a urologist to look for causes of these recurrent infection and if one can not be identified consider antibiotic prophylaxis.   2.  Labs / imaging needed at time of follow-up: None  3.  Pending labs/ test needing follow-up: None  Follow-up Appointments: Follow-up Information    Lin Landsman, MD Follow up.   Specialty:  Family Medicine Contact information: [place] [id number] [place] Hospital Course by problem list: Principal Problem:   Urinary tract infection Active Problems:   Abdominal pain   1. Urinary Tract Infection. Mrs Mcconnell is a 56 y.o. Female with a PMHx significant for DM and recurrent UTIs  With ESBL E. Coli who presented to the ED with worsening abdominal pain and N/V of 4 days duration. She states that this is typically how her UTI present with with treatment her abdominal pain and N/V significantly improve. Urine analysis showed pyuria and bacteruria. All other initial labs were unremarkable and she did not have a leukocytosis. 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 Urine cultures ultimately returned positive for Klebsiella pneumoniae that was sensitive to cephalosporins. Pharmacy recommended treatment with Keflex and the patient tolerated the initial 3 doses without issues. Over the course of the hospitalization she significantly improved with resolution of her abdominal pain and N/V. She was able to tolerate oral intake and ambulate without difficulty on the day of discharge. She was provided with instructions to follow-up with her primary care doctor and a prescription for 3 more doses of Keflex (total antibiotic days 5). No other medical management changes occurred. Given her history of 2 UTIs in <6 months and 3 UTIs in  ~12 months she does qualify for antimicrobial prophylaxis. However, given her history of ESBL E. Coli urinary tract infection I would recommend referral to urinology for evaluation of the cause of these recurrent infections.   Discharge Vitals:   BP 135/83 (BP Location: Right Arm)   Pulse 63   Temp 99.1 F (37.3 C) (Oral)   Resp 16   Ht 5\' 6"  (1.676 m)   Wt 243 lb 6.2 oz (110.4 kg)   SpO2 99%   BMI 39.28 kg/m   Pertinent Labs, Studies, and Procedures:  Results for orders placed or performed during the hospital encounter of 01/21/17  Urine culture     Status: Abnormal   Collection Time: 01/21/17  4:24 AM  Result Value Ref Range Status   Specimen Description URINE, RANDOM  Final   Special Requests NONE  Final   Culture >=100,000 COLONIES/mL KLEBSIELLA PNEUMONIAE (A)  Final   Report Status 01/23/2017 FINAL  Final  Organism ID, Bacteria KLEBSIELLA PNEUMONIAE (A)  Final      Susceptibility   Klebsiella pneumoniae - MIC*    AMPICILLIN >=32 RESISTANT Resistant     CEFAZOLIN <=4 SENSITIVE Sensitive     CEFTRIAXONE <=1 SENSITIVE Sensitive     CIPROFLOXACIN <=0.25 SENSITIVE Sensitive     GENTAMICIN <=1 SENSITIVE Sensitive     IMIPENEM 0.5 SENSITIVE Sensitive     NITROFURANTOIN 32 SENSITIVE Sensitive     TRIMETH/SULFA <=20 SENSITIVE Sensitive     AMPICILLIN/SULBACTAM 4 SENSITIVE Sensitive     PIP/TAZO <=4 SENSITIVE Sensitive     Extended ESBL NEGATIVE Sensitive     * >=100,000 COLONIES/mL KLEBSIELLA PNEUMONIAE   Discharge Instructions: Discharge Instructions    Diet - low sodium heart healthy    Complete by:  As directed    Increase activity slowly    Complete by:  As directed      Signed: Ina Homes, MD 01/24/2017, 10:53 AM   My Pager: [phone]

## 2017-02-16 ENCOUNTER — Encounter: Payer: Self-pay | Admitting: Interventional Radiology

## 2017-05-09 ENCOUNTER — Other Ambulatory Visit: Payer: Self-pay | Admitting: *Deleted

## 2017-05-09 ENCOUNTER — Other Ambulatory Visit (HOSPITAL_COMMUNITY): Payer: Self-pay | Admitting: Interventional Radiology

## 2017-05-09 DIAGNOSIS — N2889 Other specified disorders of kidney and ureter: Secondary | ICD-10-CM

## 2017-05-10 ENCOUNTER — Other Ambulatory Visit: Payer: Self-pay | Admitting: Radiology

## 2017-05-10 MED ORDER — PREDNISONE 50 MG PO TABS: ORAL_TABLET | ORAL | 0 refills | Status: AC

## 2017-05-10 MED ORDER — DIPHENHYDRAMINE HCL 50 MG PO TABS: 50.0000 mg | ORAL_TABLET | Freq: Once | ORAL | 0 refills | Status: DC

## 2017-05-17 ENCOUNTER — Other Ambulatory Visit (HOSPITAL_COMMUNITY): Payer: Self-pay | Admitting: Interventional Radiology

## 2017-06-13 ENCOUNTER — Ambulatory Visit (HOSPITAL_COMMUNITY)
Admission: RE | Admit: 2017-06-13 | Discharge: 2017-06-13 | Disposition: A | Discharge status: Home / Self Care | Payer: BC Managed Care – PPO | Source: Ambulatory Visit | Attending: Interventional Radiology | Admitting: Interventional Radiology

## 2017-06-13 DIAGNOSIS — N2889 Other specified disorders of kidney and ureter: Secondary | ICD-10-CM | POA: Insufficient documentation

## 2017-06-13 DIAGNOSIS — K760 Fatty (change of) liver, not elsewhere classified: Secondary | ICD-10-CM | POA: Diagnosis not present

## 2017-06-13 MED ORDER — GADOBENATE DIMEGLUMINE 529 MG/ML IV SOLN
20.0000 mL | Freq: Once | INTRAVENOUS | Status: AC | PRN
  Administered 2017-06-13: 10 mL via INTRAVENOUS

## 2017-06-16 ENCOUNTER — Encounter: Payer: Self-pay | Admitting: *Deleted

## 2017-06-16 ENCOUNTER — Ambulatory Visit
Admission: RE | Admit: 2017-06-16 | Discharge: 2017-06-16 | Disposition: A | Discharge status: Home / Self Care | Payer: BC Managed Care – PPO | Source: Ambulatory Visit | Attending: Interventional Radiology | Admitting: Interventional Radiology

## 2017-06-16 DIAGNOSIS — N2889 Other specified disorders of kidney and ureter: Secondary | ICD-10-CM

## 2017-06-16 HISTORY — PX: IR RADIOLOGIST EVAL & MGMT: IMG5224

## 2017-06-16 NOTE — Progress Notes (Signed)
Referring Physician(s): Dr Phillis Knack  Chief Complaint: The patient is seen in follow up today  ---known right upper pole lesion and second posterior interpolar region lesion    History of present illness:  Pt has followed with Dr Pascal Lux since 2017. Renal lesions found incidentally after CT scan performed 11/2015 for abd pain. Most recent visit here was 12/2016---lesions were identified on MRI. Options at that time presented to the pt was either watch and re image or consider cryoablation. Pt and family and Dr Pascal Lux determined best to wait and re image.  Imaging now performed 06/13/2017:IMPRESSION: 1. Continued slow growth of dominant exophytic lesion involving the upper pole of the right kidney, consistent with papillary renal cell carcinoma. Second lesion in the posterior interpolar region has also minimally enlarged. 2. No suspicious left renal findings. 3. No evidence of metastatic disease. 4. Hepatic steatosis.  Scheduled today to discuss with Dr Pascal Lux next step.  Pt does state she has noted more pain in Right flank and off and on chills for few weeks. Has also seen hematuria on few occasions.    Past Medical History:  Diagnosis Date  . Anemia    hx of   . 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 Asthma    due to allergies   . Cancer ([place])   . Complication of anesthesia   . Diabetes mellitus   . Dysrhythmia    " irregular heart rate per patient"  . Family history of adverse reaction to anesthesia    difficult for father to wake after surgery  . Heart murmur   . Hypercholesteremia   . Hypertension   . PONV (postoperative nausea and vomiting)   . Thyroid disease   . TIA (transient ischemic attack)     Past Surgical History:  Procedure Laterality Date  . ABDOMINAL HYSTERECTOMY    . CHOLECYSTECTOMY    . COLON SURGERY    . IR RADIOLOGIST EVAL & MGMT  01/12/2017  . KNEE ARTHROSCOPY    . LEFT HEART CATHETERIZATION WITH CORONARY ANGIOGRAM N/A 04/24/2014   Procedure:  LEFT HEART CATHETERIZATION WITH CORONARY ANGIOGRAM;  Surgeon: Burnell Blanks, MD;  Location: [facility] CATH LAB;  Service: Cardiovascular;  Laterality: N/A;  . THYROIDECTOMY N/A 09/20/2014   Procedure: TOTAL THYROIDECTOMY;  Surgeon: Armandina Gemma, MD;  Location: [facility] ORS;  Service: General;  Laterality: N/A;  . TONSILLECTOMY      Allergies: Lorabid [loracarbef]; Augmentin [amoxicillin-pot clavulanate]; Codeine; Gadolinium derivatives; Sulfa drugs cross reactors; Benicar [olmesartan medoxomil]; and Other  Medications: Prior to Admission medications   Medication Sig Start Date End Date Taking? Authorizing Provider  acetaminophen (TYLENOL) 500 MG tablet Take 1,000 mg by mouth every 4 (four) hours as needed (body aches).   Yes [provider]  albuterol (PROVENTIL HFA;VENTOLIN HFA) 108 (90 BASE) MCG/ACT inhaler Inhale 2 puffs into the lungs every 4 (four) hours as needed for wheezing or shortness of breath. 09/07/13  Yes Leeanne Rio, MD  calcium-vitamin D (OSCAL WITH D) 500-200 MG-UNIT tablet Take 2 tablets by mouth 3 (three) times daily. Patient taking differently: Take 2 tablets by mouth 2 (two) times daily.  05/17/16  Yes Geradine Girt, DO  clopidogrel (PLAVIX) 75 MG tablet Take 1 tablet (75 mg total) by mouth daily. 09/15/15  Yes Florencia Reasons, MD  diphenhydrAMINE (BENADRYL) 25 MG tablet Take 25 mg by mouth at bedtime.   Yes [provider]  furosemide (LASIX) 40 MG tablet Take 2 tablets (  80 mg total) by mouth 2 (two) times daily. 05/17/16  Yes Eulogio Bear U, DO  lansoprazole (PREVACID) 15 MG capsule Take 1 capsule (15 mg total) by mouth daily at 12 noon. 03/07/15  Yes Little, Wenda Overland, MD  levothyroxine (SYNTHROID, LEVOTHROID) 300 MCG tablet Take 300 mcg by mouth daily before breakfast.   Yes [provider]  lisinopril (PRINIVIL,ZESTRIL) 10 MG tablet Take 10 mg by mouth daily.  09/09/14  Yes [provider]  metFORMIN (GLUCOPHAGE) 1000 MG tablet Take 1  tablet (1,000 mg total) by mouth 2 (two) times daily with a meal. 04/29/14  Yes Mikhail, Gwynn, DO  metoprolol (LOPRESSOR) 100 MG tablet Take 100 mg by mouth 2 (two) times daily. 06/23/16 06/23/17 Yes [provider]  miconazole (MICOTIN) 2 % cream Apply topically 2 (two) times daily. 01/24/17  Yes Helberg, Larkin Ina, MD  miconazole (MICOTIN) 200 MG vaginal suppository Place 1 suppository (200 mg total) vaginally at bedtime. 01/24/17  Yes Helberg, Larkin Ina, MD  Omega-3 Fatty Acids (FISH OIL PO) Take 1 capsule by mouth daily.   Yes [provider]  ondansetron (ZOFRAN-ODT) 8 MG disintegrating tablet Take 8 mg by mouth 3 (three) times daily as needed for nausea or vomiting.   Yes [provider]  potassium chloride SA (K-DUR,KLOR-CON) 20 MEQ tablet Take 40 mEq by mouth 2 (two) times daily.    Yes [provider]  pravastatin (PRAVACHOL) 20 MG tablet Take 20 mg by mouth daily.  06/25/13  Yes [provider]  Probiotic Product (PROBIOTIC PO) Take 1 tablet by mouth daily.   Yes [provider]  sitaGLIPtin (JANUVIA) 100 MG tablet Take 100 mg by mouth daily.   Yes [provider]  cephALEXin (KEFLEX) 500 MG capsule Take 1 capsule (500 mg total) by mouth every 12 (twelve) hours. Patient not taking: Reported on 06/16/2017 01/24/17   Ina Homes, MD  diphenhydrAMINE (BENADRYL) 50 MG tablet Take 1 tablet (50 mg total) by mouth once for 1 dose. Take 1 tablet by mouth 1 hr prior to MR (scheduled for 06/13/2017). 06/13/17 06/13/17  Greggory Keen, MD  phenazopyridine (PYRIDIUM) 200 MG tablet Take 1 tablet (200 mg total) by mouth 3 (three) times daily as needed for pain. Patient not taking: Reported on 06/16/2017 07/06/16   Mesner, Corene Cornea, MD  promethazine (PHENERGAN) 25 MG tablet Take 1 tablet (25 mg total) by mouth every 6 (six) hours as needed for nausea or vomiting. Patient not taking: Reported on 06/16/2017 10/11/15   Virgel Manifold, MD     Family History    Problem Relation Age of Onset  . Heart failure Mother   . Diabetes Mother   . Stroke Father   . Heart failure Father   . Cancer Father     Social History   Socioeconomic History  . Marital status: Married    Spouse name: Glendell Docker  . Number of children: 2  . Years of education: 10  . Highest education level: Not on file  Occupational History    Comment: NA, disabled  Social Needs  . Financial resource strain: Not on file  . Food insecurity:    Worry: Not on file    Inability: Not on file  . Transportation needs:    Medical: Not on file    Non-medical: Not on file  Tobacco Use  . Smoking status: Never Smoker  . Smokeless tobacco: Never Used  Substance and Sexual Activity  . Alcohol use: No  . Drug use: No  .  Sexual activity: Not on file  Lifestyle  . Physical activity:    Days per week: Not on file    Minutes per session: Not on file  . Stress: Not on file  Relationships  . Social connections:    Talks on phone: Not on file    Gets together: Not on file    Attends religious service: Not on file    Active member of club or organization: Not on file    Attends meetings of clubs or organizations: Not on file    Relationship status: Not on file  Other Topics Concern  . Not on file  Social History Narrative   Lives with husband and son     Vital Signs: BP (!) 146/99   Pulse 74   Temp 98.9 F (37.2 C)   Resp 16   Ht 5\' 6"  (1.676 m)   Wt 245 lb (111.1 kg)   SpO2 97%   BMI 39.54 kg/m   Physical Exam  Constitutional: She is oriented to person, place, and time.  Cardiovascular: Normal rate, regular rhythm and normal heart sounds.  Pulmonary/Chest: Effort normal and breath sounds normal.  Abdominal: Soft. Bowel sounds are normal.  Musculoskeletal: Normal range of motion.  Neurological: She is alert and oriented to person, place, and time.  Skin: Skin is warm and dry.  Psychiatric: She has a normal mood and affect. Her behavior is normal.  Nursing note and  vitals reviewed.   Imaging: Mr Abdomen Wwo Contrast  Result Date: 06/13/2017 CLINICAL DATA:  Papillary renal cell carcinoma. Gadolinium contrast allergy. EXAM: MRI ABDOMEN WITHOUT AND WITH CONTRAST TECHNIQUE: Multiplanar multisequence MR imaging of the abdomen was performed both before and after the administration of intravenous contrast. CONTRAST:  80mL MULTIHANCE GADOBENATE DIMEGLUMINE 529 MG/ML IV SOLN. The patient received standard 13 hour prep with steroids and benadryl due to history of contrast allergy. COMPARISON:  MRI 12/20/2016 FINDINGS: Lower chest:  The visualized lower chest appears unremarkable. Hepatobiliary: Diffuse hepatic steatosis. No focal lesion or abnormal enhancement demonstrated. Previous cholecystectomy without significant biliary dilatation. Pancreas: Unremarkable. No pancreatic ductal dilatation or surrounding inflammatory changes. Spleen: Normal in size without focal abnormality. Adrenals/Urinary Tract: Both adrenal glands appear normal. The exophytic lesion involving the upper pole of the right kidney measures 2.0 x 1.9 cm (image 31/3), previously 1.7 x 1.8 cm. This lesion demonstrates heterogeneous, predominately low T2 signal and low level enhancement following contrast. Second lesion involving the posterior interpolar region of the right kidney has also mildly enlarged, measuring 1.0 x 1.1 cm on image 38/3 (previously 0.8 x 1.0 cm). This lesion has similar imaging characteristics with low level enhancement. Small simple cyst involving the upper pole of the left kidney appears stable. Stomach/Bowel: No evidence of bowel wall thickening, distention or surrounding inflammatory change. Vascular/Lymphatic: There are no enlarged abdominal lymph nodes. Mild aortic and branch vessel atherosclerosis. No venous abnormalities. Other: No evidence of abdominal wall mass or hernia. Musculoskeletal: No acute or significant osseous findings. IMPRESSION: 1. Continued slow growth of dominant  exophytic lesion involving the upper pole of the right kidney, consistent with papillary renal cell carcinoma. Second lesion in the posterior interpolar region has also minimally enlarged. 2. No suspicious left renal findings. 3. No evidence of metastatic disease. 4. Hepatic steatosis. Electronically Signed   By: Richardean Sale M.D.   On: 06/13/2017 15:32    Labs:  CBC: Recent Labs    06/23/16 0708 07/06/16 1349 01/21/17 0419 01/22/17 0717  WBC 7.0 6.7 3.8*  5.6  HGB 12.0 12.6 12.8 10.7*  HCT 37.4 38.5 38.6 33.6*  PLT 222 258 226 198    COAGS: No results for input(s): INR, APTT in the last 8760 hours.  BMP: Recent Labs    07/06/16 1349 12/20/16 0752 01/21/17 0419 01/22/17 0717 01/23/17 0227  NA 142  --  136 140 137  K 4.0  --  4.1 3.2* 4.0  CL 103  --  103 110 108  CO2 28  --  22 22 21*  GLUCOSE 209*  --  261* 170* 194*  BUN 11  --  21* 10 8  CALCIUM 8.7*  --  8.4* 7.8* 8.2*  CREATININE 0.88 1.20* 1.25* 0.89 0.85  GFRNONAA >60  --  48* >60 >60  GFRAA >60  --  55* >60 >60    LIVER FUNCTION TESTS: Recent Labs    07/06/16 1349 01/21/17 0419  BILITOT 0.7 0.7  AST 17 17  ALT 18 21  ALKPHOS 62 70  PROT 7.1 6.7  ALBUMIN 3.6 3.6    Assessment:   Discussion with Dr Pascal Lux regarding right renal lesions Options : Surgery Vs Cryoablation of lesions with possible biopsy. Pt has chosen to move ahead with Cryoablation of Rt renal lesions All questions answered to pt and family satisfaction She will hear from scheduler for time and date.    SignedLavonia Drafts, PA-C 06/16/2017, 1:59 PM   Please refer to Dr. Pascal Lux attestation of this note for management and plan.

## 2017-06-26 ENCOUNTER — Encounter (HOSPITAL_COMMUNITY): Payer: Self-pay | Admitting: Emergency Medicine

## 2017-06-26 ENCOUNTER — Observation Stay (HOSPITAL_COMMUNITY)
Admission: EM | Admit: 2017-06-26 | Discharge: 2017-06-29 | Disposition: A | Discharge status: Home / Self Care | Payer: BC Managed Care – PPO | Attending: Internal Medicine | Admitting: Internal Medicine

## 2017-06-26 DIAGNOSIS — Z7902 Long term (current) use of antithrombotics/antiplatelets: Secondary | ICD-10-CM | POA: Insufficient documentation

## 2017-06-26 DIAGNOSIS — R6520 Severe sepsis without septic shock: Secondary | ICD-10-CM | POA: Diagnosis not present

## 2017-06-26 DIAGNOSIS — B961 Klebsiella pneumoniae [K. pneumoniae] as the cause of diseases classified elsewhere: Secondary | ICD-10-CM | POA: Diagnosis not present

## 2017-06-26 DIAGNOSIS — E872 Acidosis, unspecified: Secondary | ICD-10-CM

## 2017-06-26 DIAGNOSIS — E039 Hypothyroidism, unspecified: Secondary | ICD-10-CM | POA: Diagnosis present

## 2017-06-26 DIAGNOSIS — I712 Thoracic aortic aneurysm, without rupture: Secondary | ICD-10-CM | POA: Insufficient documentation

## 2017-06-26 DIAGNOSIS — Z7984 Long term (current) use of oral hypoglycemic drugs: Secondary | ICD-10-CM | POA: Insufficient documentation

## 2017-06-26 DIAGNOSIS — Z87440 Personal history of urinary (tract) infections: Secondary | ICD-10-CM | POA: Diagnosis not present

## 2017-06-26 DIAGNOSIS — E119 Type 2 diabetes mellitus without complications: Secondary | ICD-10-CM | POA: Diagnosis not present

## 2017-06-26 DIAGNOSIS — R339 Retention of urine, unspecified: Secondary | ICD-10-CM | POA: Insufficient documentation

## 2017-06-26 DIAGNOSIS — E890 Postprocedural hypothyroidism: Secondary | ICD-10-CM | POA: Insufficient documentation

## 2017-06-26 DIAGNOSIS — K760 Fatty (change of) liver, not elsewhere classified: Secondary | ICD-10-CM | POA: Diagnosis not present

## 2017-06-26 DIAGNOSIS — I110 Hypertensive heart disease with heart failure: Secondary | ICD-10-CM | POA: Insufficient documentation

## 2017-06-26 DIAGNOSIS — Z880 Allergy status to penicillin: Secondary | ICD-10-CM | POA: Diagnosis not present

## 2017-06-26 DIAGNOSIS — I10 Essential (primary) hypertension: Secondary | ICD-10-CM | POA: Diagnosis present

## 2017-06-26 DIAGNOSIS — Z6841 Body Mass Index (BMI) 40.0 and over, adult: Secondary | ICD-10-CM | POA: Diagnosis not present

## 2017-06-26 DIAGNOSIS — K219 Gastro-esophageal reflux disease without esophagitis: Secondary | ICD-10-CM | POA: Insufficient documentation

## 2017-06-26 DIAGNOSIS — N2889 Other specified disorders of kidney and ureter: Secondary | ICD-10-CM | POA: Diagnosis not present

## 2017-06-26 DIAGNOSIS — A4159 Other Gram-negative sepsis: Secondary | ICD-10-CM | POA: Diagnosis not present

## 2017-06-26 DIAGNOSIS — E785 Hyperlipidemia, unspecified: Secondary | ICD-10-CM | POA: Diagnosis not present

## 2017-06-26 DIAGNOSIS — N179 Acute kidney failure, unspecified: Secondary | ICD-10-CM | POA: Diagnosis not present

## 2017-06-26 DIAGNOSIS — E876 Hypokalemia: Secondary | ICD-10-CM | POA: Diagnosis not present

## 2017-06-26 DIAGNOSIS — Z8249 Family history of ischemic heart disease and other diseases of the circulatory system: Secondary | ICD-10-CM | POA: Diagnosis not present

## 2017-06-26 DIAGNOSIS — I5032 Chronic diastolic (congestive) heart failure: Secondary | ICD-10-CM | POA: Diagnosis not present

## 2017-06-26 DIAGNOSIS — Z881 Allergy status to other antibiotic agents status: Secondary | ICD-10-CM | POA: Insufficient documentation

## 2017-06-26 DIAGNOSIS — I2510 Atherosclerotic heart disease of native coronary artery without angina pectoris: Secondary | ICD-10-CM | POA: Insufficient documentation

## 2017-06-26 DIAGNOSIS — N390 Urinary tract infection, site not specified: Secondary | ICD-10-CM | POA: Diagnosis not present

## 2017-06-26 DIAGNOSIS — Z8673 Personal history of transient ischemic attack (TIA), and cerebral infarction without residual deficits: Secondary | ICD-10-CM | POA: Insufficient documentation

## 2017-06-26 DIAGNOSIS — E1140 Type 2 diabetes mellitus with diabetic neuropathy, unspecified: Secondary | ICD-10-CM

## 2017-06-26 DIAGNOSIS — Z882 Allergy status to sulfonamides status: Secondary | ICD-10-CM | POA: Insufficient documentation

## 2017-06-26 DIAGNOSIS — E669 Obesity, unspecified: Secondary | ICD-10-CM | POA: Insufficient documentation

## 2017-06-26 DIAGNOSIS — Z823 Family history of stroke: Secondary | ICD-10-CM | POA: Insufficient documentation

## 2017-06-26 LAB — CBC WITH DIFFERENTIAL/PLATELET
Basophils Absolute: 0 K/uL (ref 0.0–0.1)
Basophils Relative: 0 %
Eosinophils Absolute: 0.2 K/uL (ref 0.0–0.7)
Eosinophils Relative: 2 %
HCT: 39.5 % (ref 36.0–46.0)
Hemoglobin: 12.7 g/dL (ref 12.0–15.0)
Lymphocytes Relative: 32 %
Lymphs Abs: 2.6 K/uL (ref 0.7–4.0)
MCH: 28.5 pg (ref 26.0–34.0)
MCHC: 32.2 g/dL (ref 30.0–36.0)
MCV: 88.8 fL (ref 78.0–100.0)
Monocytes Absolute: 0.5 K/uL (ref 0.1–1.0)
Monocytes Relative: 6 %
Neutro Abs: 4.9 K/uL (ref 1.7–7.7)
Neutrophils Relative %: 60 %
Platelets: 258 K/uL (ref 150–400)
RBC: 4.45 MIL/uL (ref 3.87–5.11)
RDW: 13.8 % (ref 11.5–15.5)
WBC: 8.2 K/uL (ref 4.0–10.5)

## 2017-06-26 LAB — COMPREHENSIVE METABOLIC PANEL
ALBUMIN: 3.6 g/dL (ref 3.5–5.0)
ALK PHOS: 62 U/L (ref 38–126)
ALT: 20 U/L (ref 14–54)
AST: 22 U/L (ref 15–41)
Anion gap: 15 (ref 5–15)
BUN: 17 mg/dL (ref 6–20)
CALCIUM: 8.4 mg/dL — AB (ref 8.9–10.3)
CO2: 21 mmol/L — AB (ref 22–32)
CREATININE: 1.33 mg/dL — AB (ref 0.44–1.00)
Chloride: 101 mmol/L (ref 101–111)
GFR calc Af Amer: 51 mL/min — ABNORMAL LOW (ref >60)
GFR calc non Af Amer: 44 mL/min — ABNORMAL LOW (ref >60)
GLUCOSE: 381 mg/dL — AB (ref 65–99)
Potassium: 3.6 mmol/L (ref 3.5–5.1)
SODIUM: 137 mmol/L (ref 135–145)
Total Bilirubin: 0.5 mg/dL (ref 0.3–1.2)
Total Protein: 6.6 g/dL (ref 6.5–8.1)

## 2017-06-26 LAB — URINALYSIS, ROUTINE W REFLEX MICROSCOPIC
Bilirubin Urine: NEGATIVE
Glucose, UA: 50 mg/dL — AB
Hgb urine dipstick: NEGATIVE
Ketones, ur: NEGATIVE mg/dL
Nitrite: NEGATIVE
Protein, ur: NEGATIVE mg/dL
Specific Gravity, Urine: 1.014 (ref 1.005–1.030)
pH: 5 (ref 5.0–8.0)

## 2017-06-26 LAB — I-STAT CG4 LACTIC ACID, ED
LACTIC ACID, VENOUS: 2.95 mmol/L — AB (ref 0.5–1.9)
Lactic Acid, Venous: 4.57 mmol/L (ref 0.5–1.9)

## 2017-06-26 MED ORDER — LACTATED RINGERS IV SOLN
INTRAVENOUS | Status: AC
  Administered 2017-06-27: 02:00:00 via INTRAVENOUS

## 2017-06-26 MED ORDER — PANTOPRAZOLE SODIUM 20 MG PO TBEC
20.0000 mg | DELAYED_RELEASE_TABLET | Freq: Every day | ORAL | Status: DC
  Administered 2017-06-27 – 2017-06-29 (×3): 20 mg via ORAL
  Filled 2017-06-26 (×5): qty 1

## 2017-06-26 MED ORDER — OXYCODONE HCL 5 MG PO TABS
5.0000 mg | ORAL_TABLET | ORAL | Status: DC | PRN
  Administered 2017-06-28: 5 mg via ORAL
  Filled 2017-06-26: qty 1

## 2017-06-26 MED ORDER — ENOXAPARIN SODIUM 40 MG/0.4ML ~~LOC~~ SOLN
40.0000 mg | Freq: Every day | SUBCUTANEOUS | Status: DC
  Administered 2017-06-27 – 2017-06-28 (×2): 40 mg via SUBCUTANEOUS
  Filled 2017-06-26 (×4): qty 0.4

## 2017-06-26 MED ORDER — CLOPIDOGREL BISULFATE 75 MG PO TABS
75.0000 mg | ORAL_TABLET | Freq: Every day | ORAL | Status: DC
  Administered 2017-06-27 – 2017-06-29 (×3): 75 mg via ORAL
  Filled 2017-06-26 (×3): qty 1

## 2017-06-26 MED ORDER — ONDANSETRON 4 MG PO TBDP
8.0000 mg | ORAL_TABLET | Freq: Three times a day (TID) | ORAL | Status: DC | PRN
  Administered 2017-06-27 – 2017-06-28 (×4): 8 mg via ORAL
  Filled 2017-06-26 (×6): qty 2

## 2017-06-26 MED ORDER — INSULIN ASPART 100 UNIT/ML ~~LOC~~ SOLN
0.0000 [IU] | Freq: Three times a day (TID) | SUBCUTANEOUS | Status: DC
  Administered 2017-06-27: 3 [IU] via SUBCUTANEOUS
  Administered 2017-06-27: 11 [IU] via SUBCUTANEOUS
  Administered 2017-06-28: 5 [IU] via SUBCUTANEOUS
  Administered 2017-06-28: 3 [IU] via SUBCUTANEOUS
  Administered 2017-06-28: 5 [IU] via SUBCUTANEOUS
  Administered 2017-06-29 (×2): 3 [IU] via SUBCUTANEOUS

## 2017-06-26 MED ORDER — LACTATED RINGERS IV BOLUS
2000.0000 mL | Freq: Once | INTRAVENOUS | Status: AC
  Administered 2017-06-26: 2000 mL via INTRAVENOUS

## 2017-06-26 MED ORDER — PRAVASTATIN SODIUM 10 MG PO TABS
20.0000 mg | ORAL_TABLET | Freq: Every day | ORAL | Status: DC
  Administered 2017-06-27 – 2017-06-29 (×3): 20 mg via ORAL
  Filled 2017-06-26 (×3): qty 2

## 2017-06-26 MED ORDER — INSULIN ASPART 100 UNIT/ML ~~LOC~~ SOLN
5.0000 [IU] | Freq: Once | SUBCUTANEOUS | Status: AC
  Administered 2017-06-27: 5 [IU] via SUBCUTANEOUS
  Filled 2017-06-26: qty 1

## 2017-06-26 MED ORDER — ALBUTEROL SULFATE (2.5 MG/3ML) 0.083% IN NEBU: 2.5000 mg | INHALATION_SOLUTION | RESPIRATORY_TRACT | Status: DC | PRN

## 2017-06-26 MED ORDER — SODIUM CHLORIDE 0.9 % IV SOLN
1.0000 g | Freq: Three times a day (TID) | INTRAVENOUS | Status: DC
  Administered 2017-06-27 – 2017-06-29 (×8): 1 g via INTRAVENOUS
  Filled 2017-06-26 (×9): qty 1

## 2017-06-26 MED ORDER — ONDANSETRON HCL 4 MG PO TABS
4.0000 mg | ORAL_TABLET | Freq: Once | ORAL | Status: AC
  Administered 2017-06-26: 4 mg via ORAL
  Filled 2017-06-26: qty 1

## 2017-06-26 MED ORDER — METOPROLOL TARTRATE 100 MG PO TABS
100.0000 mg | ORAL_TABLET | Freq: Two times a day (BID) | ORAL | Status: DC
  Administered 2017-06-27 – 2017-06-29 (×6): 100 mg via ORAL
  Filled 2017-06-26 (×4): qty 1
  Filled 2017-06-26: qty 4
  Filled 2017-06-26: qty 1

## 2017-06-26 MED ORDER — CIPROFLOXACIN IN D5W 400 MG/200ML IV SOLN: 400.0000 mg | Freq: Once | INTRAVENOUS | Status: DC

## 2017-06-26 MED ORDER — LEVOTHYROXINE SODIUM 100 MCG PO TABS
300.0000 ug | ORAL_TABLET | Freq: Every day | ORAL | Status: DC
  Administered 2017-06-27 – 2017-06-28 (×2): 300 ug via ORAL
  Filled 2017-06-26 (×3): qty 3

## 2017-06-26 MED ORDER — PHENAZOPYRIDINE HCL 100 MG PO TABS
95.0000 mg | ORAL_TABLET | Freq: Once | ORAL | Status: AC
  Administered 2017-06-26: 100 mg via ORAL
  Filled 2017-06-26: qty 1

## 2017-06-26 MED ORDER — CALCIUM CARBONATE-VITAMIN D 500-200 MG-UNIT PO TABS
2.0000 | ORAL_TABLET | Freq: Three times a day (TID) | ORAL | Status: DC
  Administered 2017-06-27 – 2017-06-29 (×7): 2 via ORAL
  Filled 2017-06-26 (×8): qty 2

## 2017-06-26 NOTE — ED Notes (Signed)
Delay in second set blood culture,  MD at bedside.

## 2017-06-26 NOTE — ED Notes (Signed)
Bladder scan resulted 67ml

## 2017-06-26 NOTE — H&P (Signed)
History and Physical   [name] WER:[id number] DOB: 02/22/1961 DOA: 06/26/2017  PCP: Lin Landsman, MD  Chief Complaint: urinary problems  HPI: this is a 57 year old woman with medical problems including obesity, right renal mass concerning for renal cancer, chronic diastolic/preserved EF heart failure, non-insulin-dependent diabetes, history of TIA, GERD, history of thyroidectomy, thoracic aortic aneurysm, and recurrent urinary tract infections presenting with suprapubic pain.  She reports for the past 2-3 nights, she's had urinary urgency, but times has had inability to urinate. She denies any fevers, but does report taking nonsteroidal anti-inflammatory drugs around the clock for her chronic right flank pain that she attributes to her renal mass. She reports planned cryotherapy ablation with the interventional radiology team within the next few weeks. She denies dysuria, but reports these are symptoms consistent with her prior urinary tract infections. She reports a history of what sounds like anaphylaxis/angioedema to Augmentin. She cites the past year she was discharged on Augmentin, this resulted in hives, nausea and vomiting acute onset, swelling of her mouth and throat, and shortness of breath.  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 She is a nonsmoker.  Of note, she was admitted last in October 2018 for urinary tract infection. Prior cultures reviewed, in the past she has had Cipro resistant organisms.  ED Course: in the emergency department vital signs remarkable for systolic blood pressure of 102, normal heart rate. CBC with normal white blood cell count.  BMP remarkable for creatinine of 1.3, her baseline is less than 1. Lactic acid of 4.57, this down trended to 2.95.UA remarkable for large leukocytes.  Blood and urine cultures ordered.  Review of Systems: A complete ROS was obtained; pertinent positives negatives are denoted  in the HPI. Otherwise, all systems are negative.   Past Medical History:  Diagnosis Date  . Anemia    hx of   . 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 Asthma    due to allergies   . Cancer ([place])   . Complication of anesthesia   . Diabetes mellitus   . Dysrhythmia    " irregular heart rate per patient"  . Family history of adverse reaction to anesthesia    difficult for father to wake after surgery  . Heart murmur   . Hypercholesteremia   . Hypertension   . PONV (postoperative nausea and vomiting)   . Thyroid disease   . TIA (transient ischemic attack)    Social History   Socioeconomic History  . Marital status: Married    Spouse name: Glendell Docker  . Number of children: 2  . Years of education: 48  . Highest education level: Not on file  Occupational History    Comment: NA, disabled  Social Needs  . Financial resource strain: Not on file  . Food insecurity:    Worry: Not on file    Inability: Not on file  . Transportation needs:    Medical: Not on file    Non-medical: Not on file  Tobacco Use  . Smoking status: Never Smoker  . Smokeless tobacco: Never Used  Substance and Sexual Activity  . Alcohol use: No  . Drug use: No  . Sexual activity: Not on file  Lifestyle  . Physical activity:    Days per week: Not on file    Minutes per session: Not on file  . Stress: Not on file  Relationships  . Social connections:  Talks on phone: Not on file    Gets together: Not on file    Attends religious service: Not on file    Active member of club or organization: Not on file    Attends meetings of clubs or organizations: Not on file    Relationship status: Not on file  . Intimate partner violence:    Fear of current or ex partner: Not on file    Emotionally abused: Not on file    Physically abused: Not on file    Forced sexual activity: Not on file  Other Topics Concern  . Not on file  Social History Narrative   Lives with husband and son   Family History  Problem  Relation Age of Onset  . Heart failure Mother   . Diabetes Mother   . Stroke Father   . Heart failure Father   . Cancer Father     Physical Exam: Vitals:   06/26/17 2015 06/26/17 2130 06/26/17 2145 06/26/17 2200  BP: 108/85 114/82 109/82 107/84  Pulse: 82 95 87 92  Resp:      Temp:      TempSrc:      SpO2: 100% 97% 98% 98%   General: Appears calm and comfortable, obese white woman. ENT: Grossly normal hearing, MMM. Cardiovascular: RRR. No M/R/G. No LE edema.  Respiratory: CTA bilaterally. No wheezes or crackles. Normal respiratory effort. Breathing room air. Abdomen: Soft, mild tenderness to suprapubic region. Skin: No rash or induration seen on limited exam. Musculoskeletal: Grossly normal tone BUE/BLE. Appropriate ROM. Mild R flank pain to direct percussion. Psychiatric: Grossly normal mood and affect. Neurologic: Moves all extremities in coordinated fashion.  I have personally reviewed the following labs, culture data, and imaging studies.  Assessment/Plan:  Severe sepsis related to urinary tract infection, possible Acute kidney injury Lactic acidosis This is a patient with recurrent urinary tract infections, presenting with acute onset of urinary symptoms (suprapubic discomfort, urinary retention, urinary urgency). Although she only has 1 "SIRS" criteria (HR > 90 mm Hg) positive, other vital signs (temp) may be confounded by her taking NSAIDs chronically. With the presence of lactic acidosis, kidney impairment (AKI), and SBP ~100 mm Hg at initial presentation, the benefits for treating as severe sepsis outweigh burdens.   Plan: Will transition to aztreonam (pharmacy consulted to optimize dosing) given prior anaphylaxis to PCN and prior urine cultures showing resistance to ciprofloxacin in the past.  Continue hydration with LR at 100 cc q h x 10 more hours then re-assess.  Strict I and O.  Consider renal US if Cr does not improve. Follow up pending urine and blood cx. Trend  lactic acid to normalization.  Other problems -Right renal mass, concerning for malignancy: saw interventional radiology team on 06/16/2017; planning on ablation procedure when feasible -Hepatic steatosis: seen on MRI on 06/13/2017, may be contributing to lactic acidosis with concomitant metformin use -Diabetes: no insulin use prior to admission, will hold po agents, use rapid acting correction factor -Hx of TIA: continue home clopidogrel; she reports prior failure of ASA; continue statin -Obesity: continue weight reduction efforts in outpatient setting -Thoracic aortic aneurysm: noted to be 4 cm in the past -Hypothyroidism in setting of prior thyroidectomy for thyroid cancer: continue home levothyroxine, update TSH -GERD: continue PPI therapy -Chronic diastolic heart failure: hold diuretics and ACEi in setting of AKI; continue BB at home dose  DVT prophylaxis: LMWH Code Status: full code Disposition Plan: possible discharge home in 1-2 days Consults called: none  Admission status: admit to hospital medicine service, floor level of care   Cheri Rous, MD [facility] [phone]  If 7PM-7AM, please contact night-coverage [email or web address] Password [email or web address]

## 2017-06-26 NOTE — ED Notes (Signed)
Admitting at bedside 

## 2017-06-26 NOTE — ED Provider Notes (Signed)
[facility] EMERGENCY DEPARTMENT Provider Note   CSN: [id number] Arrival date & time: 06/26/17  1557     History   Chief Complaint Chief Complaint  Patient presents with  . Dysuria    HPI [name] is a 57 y.o. female.  The history is provided by the patient and the spouse.  Dysuria   This is a recurrent problem. The current episode started yesterday. The problem occurs every urination. The problem has been gradually worsening. The pain is moderate. There has been no fever. Associated symptoms include chills and frequency. Pertinent negatives include no nausea, no vomiting, no hematuria and no flank pain.  -has known worsening R renal mass -increased urinary frequency  Past Medical History:  Diagnosis Date  . Anemia    hx of   . 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 Asthma    due to allergies   . Cancer ([place])   . Complication of anesthesia   . Diabetes mellitus   . Dysrhythmia    " irregular heart rate per patient"  . Family history of adverse reaction to anesthesia    difficult for father to wake after surgery  . Heart murmur   . Hypercholesteremia   . Hypertension   . PONV (postoperative nausea and vomiting)   . Thyroid disease   . TIA (transient ischemic attack)     Patient Active Problem List   Diagnosis Date Noted  . Urinary tract infection 01/24/2017  . Abdominal pain 01/21/2017  . Stroke-like symptoms 05/16/2016  . 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 Fatty liver disease, nonalcoholic 86/76/7209  . Slurred speech 09/13/2015  . Parotitis 09/13/2015  . Gastroenteritis 08/21/2015  . Hypothyroid 08/21/2015  . 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 Pain in the chest   . Essential hypertension   . Thoracic aortic aneurysm ([place])   . Chest pain 04/23/2014  . Anginal pain ([place]) 04/23/2014  . Type 2 diabetes mellitus ([place])   . Hypertension   .  Hypercholesteremia     Past Surgical History:  Procedure Laterality Date  . ABDOMINAL HYSTERECTOMY    . CHOLECYSTECTOMY    . COLON SURGERY    . IR RADIOLOGIST EVAL & MGMT  01/12/2017  . IR RADIOLOGIST EVAL & MGMT  06/16/2017  . KNEE ARTHROSCOPY    . LEFT HEART CATHETERIZATION WITH CORONARY ANGIOGRAM N/A 04/24/2014   Procedure: LEFT HEART CATHETERIZATION WITH CORONARY ANGIOGRAM;  Surgeon: Burnell Blanks, MD;  Location: [facility] CATH LAB;  Service: Cardiovascular;  Laterality: N/A;  . THYROIDECTOMY N/A 09/20/2014   Procedure: TOTAL THYROIDECTOMY;  Surgeon: Armandina Gemma, MD;  Location: [facility] ORS;  Service: General;  Laterality: N/A;  . TONSILLECTOMY       OB History   None      Home Medications    Prior to Admission medications   Medication Sig Start Date End Date Taking? Authorizing Provider  acetaminophen (TYLENOL) 500 MG tablet Take 1,000 mg by mouth every 4 (four) hours as needed (body aches).   Yes [provider]  albuterol (PROVENTIL HFA;VENTOLIN HFA) 108 (90 BASE) MCG/ACT inhaler Inhale 2 puffs into the lungs every 4 (four) hours as needed for wheezing or shortness of breath. 09/07/13  Yes Leeanne Rio, MD  calcium-vitamin D (OSCAL WITH D) 500-200 MG-UNIT tablet Take 2 tablets by mouth 3 (three) times daily. 05/17/16  Yes Eulogio Bear U, DO  clopidogrel (PLAVIX) 75 MG  tablet Take 1 tablet (75 mg total) by mouth daily. 09/15/15  Yes Florencia Reasons, MD  diphenhydrAMINE (BENADRYL) 25 MG tablet Take 25-50 mg by mouth at bedtime.    Yes [provider]  furosemide (LASIX) 40 MG tablet Take 2 tablets (80 mg total) by mouth 2 (two) times daily. 05/17/16  Yes Vann, Jessica U, DO  ibuprofen (ADVIL,MOTRIN) 200 MG tablet Take 800 mg by mouth every 8 (eight) hours as needed.   Yes [provider]  lansoprazole (PREVACID) 15 MG capsule Take 1 capsule (15 mg total) by mouth daily at 12 noon. 03/07/15  Yes Little, Wenda Overland, MD  levothyroxine (SYNTHROID, LEVOTHROID)  300 MCG tablet Take 300 mcg by mouth daily before breakfast.   Yes [provider]  lisinopril (PRINIVIL,ZESTRIL) 10 MG tablet Take 10 mg by mouth daily.  09/09/14  Yes [provider]  metFORMIN (GLUCOPHAGE) 1000 MG tablet Take 1 tablet (1,000 mg total) by mouth 2 (two) times daily with a meal. 04/29/14  Yes Mikhail, [place], DO  metoprolol tartrate (LOPRESSOR) 100 MG tablet Take 100 mg by mouth 2 (two) times daily. 06/23/16  Yes [provider]  Omega-3 Fatty Acids (FISH OIL PO) Take 1 capsule by mouth daily.   Yes [provider]  ondansetron (ZOFRAN-ODT) 8 MG disintegrating tablet Take 8 mg by mouth 3 (three) times daily as needed for nausea or vomiting.   Yes [provider]  potassium chloride SA (K-DUR,KLOR-CON) 20 MEQ tablet Take 40 mEq by mouth 2 (two) times daily.    Yes [provider]  pravastatin (PRAVACHOL) 20 MG tablet Take 20 mg by mouth daily.  06/25/13  Yes [provider]  Probiotic Product (PROBIOTIC PO) Take 1 tablet by mouth daily.   Yes [provider]  sitaGLIPtin (JANUVIA) 100 MG tablet Take 100 mg by mouth daily.   Yes [provider]  cephALEXin (KEFLEX) 500 MG capsule Take 1 capsule (500 mg total) by mouth every 12 (twelve) hours. Patient not taking: Reported on 06/16/2017 01/24/17   Ina Homes, MD  diphenhydrAMINE (BENADRYL) 50 MG tablet Take 1 tablet (50 mg total) by mouth once for 1 dose. Take 1 tablet by mouth 1 hr prior to MR (scheduled for 06/13/2017). Patient not taking: Reported on 06/26/2017 06/13/17 06/13/17  Greggory Keen, MD  miconazole (MICOTIN) 2 % cream Apply topically 2 (two) times daily. Patient not taking: Reported on 06/26/2017 01/24/17   Ina Homes, MD  miconazole (MICOTIN) 200 MG vaginal suppository Place 1 suppository (200 mg total) vaginally at bedtime. Patient not taking: Reported on 06/26/2017 01/24/17   Ina Homes, MD  phenazopyridine (PYRIDIUM) 200 MG tablet  Take 1 tablet (200 mg total) by mouth 3 (three) times daily as needed for pain. Patient not taking: Reported on 06/16/2017 07/06/16   Mesner, Corene Cornea, MD    Family History Family History  Problem Relation Age of Onset  . Heart failure Mother   . Diabetes Mother   . Stroke Father   . Heart failure Father   . Cancer Father     Social History Social History   Tobacco Use  . Smoking status: Never Smoker  . Smokeless tobacco: Never Used  Substance Use Topics  . Alcohol use: No  . Drug use: No     Allergies   Lorabid [loracarbef]; Augmentin [amoxicillin-pot clavulanate]; Codeine; Gadolinium derivatives; Sulfa drugs cross reactors; Benicar [olmesartan medoxomil]; and Other   Review of Systems Review of Systems  Constitutional: Positive for chills. Negative for  fever.  HENT: Negative for ear pain and sore throat.   Eyes: Negative for pain and visual disturbance.  Respiratory: Negative for cough and shortness of breath.   Cardiovascular: Negative for chest pain and palpitations.  Gastrointestinal: Negative for abdominal pain, constipation, diarrhea, nausea and vomiting.  Genitourinary: Positive for difficulty urinating, dysuria and frequency. Negative for flank pain, hematuria, vaginal bleeding and vaginal discharge.  Musculoskeletal: Negative for back pain and neck pain.  Skin: Negative for color change and rash.  Neurological: Negative for syncope, light-headedness and headaches.  All other systems reviewed and are negative.    Physical Exam Updated Vital Signs BP 107/84   Pulse 92   Temp 98.7 F (37.1 C) (Oral)   Resp 16   SpO2 98%   Physical Exam  Constitutional: She is oriented to person, place, and time. She appears well-developed and well-nourished. No distress.  HENT:  Head: Normocephalic and atraumatic.  Mouth/Throat: Oropharynx is clear and moist.  Eyes: Conjunctivae are normal.  Neck: Neck supple.  Cardiovascular: Normal rate and regular rhythm.  No murmur  heard. Pulmonary/Chest: Effort normal and breath sounds normal. No respiratory distress. She has no wheezes. She has no rales.  Abdominal: Soft. She exhibits no distension. There is tenderness (suprapubic mostly). There is no rebound and no guarding.  Musculoskeletal: She exhibits no edema.  Neurological: She is alert and oriented to person, place, and time.  Skin: Skin is warm and dry.  Psychiatric: She has a normal mood and affect.  Nursing note and vitals reviewed.    ED Treatments / Results  Labs (all labs ordered are listed, but only abnormal results are displayed) Labs Reviewed  COMPREHENSIVE METABOLIC PANEL - Abnormal; Notable for the following components:      Result Value   CO2 21 (*)    Glucose, Bld 381 (*)    Creatinine, Ser 1.33 (*)    Calcium 8.4 (*)    GFR calc non Af Amer 44 (*)    GFR calc Af Amer 51 (*)    All other components within normal limits  URINALYSIS, ROUTINE W REFLEX MICROSCOPIC - Abnormal; Notable for the following components:   APPearance CLOUDY (*)    Glucose, UA 50 (*)    Leukocytes, UA LARGE (*)    Bacteria, UA MANY (*)    Squamous Epithelial / LPF 0-5 (*)    All other components within normal limits  I-STAT CG4 LACTIC ACID, ED - Abnormal; Notable for the following components:   Lactic Acid, Venous 4.57 (*)    All other components within normal limits  I-STAT CG4 LACTIC ACID, ED - Abnormal; Notable for the following components:   Lactic Acid, Venous 2.95 (*)    All other components within normal limits  URINE CULTURE  CULTURE, BLOOD (ROUTINE X 2)  CULTURE, BLOOD (ROUTINE X 2)  CBC WITH DIFFERENTIAL/PLATELET  I-STAT CG4 LACTIC ACID, ED    EKG None  Radiology No results found.  Procedures Procedures (including critical care time)  Medications Ordered in ED Medications  ciprofloxacin (CIPRO) IVPB 400 mg (has no administration in time range)  phenazopyridine (PYRIDIUM) tablet 100 mg (100 mg Oral Given 06/26/17 1929)  ondansetron  (ZOFRAN) tablet 4 mg (4 mg Oral Given 06/26/17 1929)  lactated ringers bolus 2,000 mL (2,000 mLs Intravenous New Bag/Given 06/26/17 2055)     Initial Impression / Assessment and Plan / ED Course  I have reviewed the triage vital signs and the nursing notes.  Pertinent labs & imaging results  that were available during my care of the patient were reviewed by me and considered in my medical decision making (see chart for details).    Patient is a 57 year old female with history of CAD, diabetes, hypertension, hyperlipidemia, frequent UTIs, known neoplasm of the right kidney which is been worsening who presents with worsening urinary frequency and difficulty urinating.  She also complaining of suprapubic abdominal tenderness.  She denies any fevers but has had chills today.  She has had nausea without any vomiting and no flank pain.  She denies vaginal bleeding or discharge.  On exam she is nontoxic appearing, afebrile, heart rate in the low 90s, normotensive, unlabored breathing.  Abdominal exam significant for mostly suprapubic tenderness.  She has no CVA tenderness.    Patient evaluated with labs as above significant for large leukocytes in her urine and an initial lactic acidosis of 4.5.  This improved to 2.9 with IV fluids.  She does not have a leukocytosis and does not meet to SIRS criteria so technically he is not septic at this time.  Patient has multiple antibiotic allergies so we will start with IV ciprofloxacin for treatment of comp located UTI.  Patient was also given Zofran and Pyridium for symptomatic treatment.  Patient will be admitted to medicine for further treatment of complicated UTI monitoring of her lactic acidosis.  Final Clinical Impressions(s) / ED Diagnoses   Final diagnoses:  Complicated UTI (urinary tract infection)  Lactic acidosis    ED Discharge Orders    None       Tobie Poet, DO 06/26/17 2238    Elnora Morrison, MD 06/27/17 5498    Elnora Morrison,  MD 06/27/17 [phone]

## 2017-06-26 NOTE — ED Triage Notes (Signed)
Pt to ED c/o dysuria - reports kidney cancer x 1 year, but the tumor has grown in size and she is waiting for an appointment to have a procedure done. Patient states she normally is able to urinate a lot, but hasn't since last night. Report chills but no known fevers. Pt reports R flank/lower abdominal pain and pressure.

## 2017-06-27 DIAGNOSIS — E872 Acidosis: Secondary | ICD-10-CM | POA: Diagnosis not present

## 2017-06-27 DIAGNOSIS — I10 Essential (primary) hypertension: Secondary | ICD-10-CM | POA: Diagnosis not present

## 2017-06-27 DIAGNOSIS — E1140 Type 2 diabetes mellitus with diabetic neuropathy, unspecified: Secondary | ICD-10-CM | POA: Diagnosis not present

## 2017-06-27 DIAGNOSIS — E039 Hypothyroidism, unspecified: Secondary | ICD-10-CM

## 2017-06-27 DIAGNOSIS — N390 Urinary tract infection, site not specified: Secondary | ICD-10-CM

## 2017-06-27 LAB — CBC
HCT: 34.4 % — ABNORMAL LOW (ref 36.0–46.0)
Hemoglobin: 11.3 g/dL — ABNORMAL LOW (ref 12.0–15.0)
MCH: 29.3 pg (ref 26.0–34.0)
MCHC: 32.8 g/dL (ref 30.0–36.0)
MCV: 89.1 fL (ref 78.0–100.0)
PLATELETS: 230 10*3/uL (ref 150–400)
RBC: 3.86 MIL/uL — ABNORMAL LOW (ref 3.87–5.11)
RDW: 14.5 % (ref 11.5–15.5)
WBC: 7.2 10*3/uL (ref 4.0–10.5)

## 2017-06-27 LAB — BASIC METABOLIC PANEL
ANION GAP: 10 (ref 5–15)
BUN: 16 mg/dL (ref 6–20)
CALCIUM: 8.1 mg/dL — AB (ref 8.9–10.3)
CO2: 25 mmol/L (ref 22–32)
Chloride: 104 mmol/L (ref 101–111)
Creatinine, Ser: 1.21 mg/dL — ABNORMAL HIGH (ref 0.44–1.00)
GFR, EST AFRICAN AMERICAN: 57 mL/min — AB (ref >60)
GFR, EST NON AFRICAN AMERICAN: 49 mL/min — AB (ref >60)
GLUCOSE: 233 mg/dL — AB (ref 65–99)
Potassium: 3 mmol/L — ABNORMAL LOW (ref 3.5–5.1)
SODIUM: 139 mmol/L (ref 135–145)

## 2017-06-27 LAB — CBG MONITORING, ED
GLUCOSE-CAPILLARY: 169 mg/dL — AB (ref 65–99)
Glucose-Capillary: 168 mg/dL — ABNORMAL HIGH (ref 65–99)

## 2017-06-27 LAB — GLUCOSE, CAPILLARY
GLUCOSE-CAPILLARY: 210 mg/dL — AB (ref 65–99)
Glucose-Capillary: 349 mg/dL — ABNORMAL HIGH (ref 65–99)
Glucose-Capillary: 161 mg/dL — ABNORMAL HIGH (ref 65–99)

## 2017-06-27 LAB — HIV ANTIBODY (ROUTINE TESTING W REFLEX): HIV SCREEN 4TH GENERATION: NONREACTIVE

## 2017-06-27 LAB — MAGNESIUM: Magnesium: 1.6 mg/dL — ABNORMAL LOW (ref 1.7–2.4)

## 2017-06-27 LAB — TSH: TSH: <0.01 u[IU]/mL — ABNORMAL LOW (ref 0.350–4.500)

## 2017-06-27 LAB — LACTIC ACID, PLASMA: LACTIC ACID, VENOUS: 2.3 mmol/L — AB (ref 0.5–1.9)

## 2017-06-27 MED ORDER — ACETAMINOPHEN 325 MG PO TABS
650.0000 mg | ORAL_TABLET | Freq: Four times a day (QID) | ORAL | Status: DC | PRN
Start: 2017-06-27 — End: 2017-06-29
  Administered 2017-06-27 – 2017-06-29 (×4): 650 mg via ORAL
  Filled 2017-06-27 (×4): qty 2

## 2017-06-27 MED ORDER — ZOLPIDEM TARTRATE 5 MG PO TABS
5.0000 mg | ORAL_TABLET | Freq: Every evening | ORAL | Status: DC | PRN
  Administered 2017-06-27 – 2017-06-28 (×3): 5 mg via ORAL
  Filled 2017-06-27 (×3): qty 1

## 2017-06-27 MED ORDER — POTASSIUM CHLORIDE CRYS ER 20 MEQ PO TBCR
40.0000 meq | EXTENDED_RELEASE_TABLET | Freq: Once | ORAL | Status: AC
  Administered 2017-06-27: 40 meq via ORAL
  Filled 2017-06-27: qty 2

## 2017-06-27 NOTE — ED Notes (Signed)
Lab called with Lactic Acid of 2.3. RN aware.

## 2017-06-27 NOTE — ED Notes (Addendum)
Date and time results received: 06/27/17  0458   Test: Lactic acid Critical Value: 2.3  Name of Provider Notified: Kennon Holter, MD

## 2017-06-27 NOTE — ED Notes (Signed)
Breakfast tray ordered 

## 2017-06-27 NOTE — Progress Notes (Signed)
PROGRESS NOTE    [name]  NWG:[id number] DOB: 1960/12/31 DOA: 06/26/2017 PCP: Lin Landsman, MD   Outpatient Specialists:     Brief Narrative:  57 year old woman with medical problems including obesity, right renal mass concerning for renal cancer, chronic diastolic/preserved EF heart failure, non-insulin-dependent diabetes, history of TIA, GERD, history of thyroidectomy, thoracic aortic aneurysm, and recurrent urinary tract infections presenting with suprapubic pain.  She reports for the past 2-3 nights, she's had urinary urgency, but times has had inability to urinate. She denies any fevers, but does report taking nonsteroidal anti-inflammatory drugs around the clock for her chronic right flank pain that she attributes to her renal mass. She reports planned cryotherapy ablation with the interventional radiology team within the next few weeks. She denies dysuria, but reports these are symptoms consistent with her prior urinary tract infections.     Assessment & Plan:   Active Problems:   Type 2 diabetes mellitus (HCC)   Essential hypertension   Hypothyroid   UTI (urinary tract infection)   Severe sepsis related to urinary tract infection Acute kidney injury Lactic acidosis - recurrent urinary tract infections with PMHx of ESBL -aztreonam  -hydration with LR at 100 cc q h x 10  -urine culture pending  Hypokalemia -recheck K in AM -add Mg -replace  -Right renal mass, concerning for malignancy: saw interventional radiology team on 06/16/2017; planning on ablation procedure when feasible  -Hepatic steatosis: seen on MRI on 06/13/2017, may be contributing to lactic acidosis with concomitant metformin use  -Diabetes: no insulin use prior to admission, will hold po agents, use rapid acting correction factor  -Hx of TIA: continue home clopidogrel; she reports prior failure of ASA; continue statin  -Obesity:  -asked nurse to weight to calculate BMI -suspect  >30  -Thoracic aortic aneurysm: noted to be 4 cm in the past -outpatient follow up  -Hypothyroidism in setting of prior thyroidectomy for thyroid cancer: continue home levothyroxine, -TSH low -defer to outpatient endocrinology  -GERD: continue PPI therapy  -Chronic diastolic heart failure: hold diuretics and ACEi in setting of AKI; continue BB at home dose      DVT prophylaxis:  Lovenox   Code Status: Full Code   Family Communication:   Disposition Plan:  Home once culture back   Consultants:      Subjective: C/o lower abd pain Not eating well  Objective: Vitals:   06/26/17 2315 06/26/17 2330 06/27/17 0043 06/27/17 0045  BP: 122/80 92/66 107/76 107/76  Pulse: 82 80 80 81  Resp:    16  Temp:      TempSrc:      SpO2: 99% 98%  99%    Intake/Output Summary (Last 24 hours) at 06/27/2017 0855 Last data filed at 06/27/2017 0726 Gross per 24 hour  Intake 200 ml  Output -  Net 200 ml   There were no vitals filed for this visit.  Examination:  General exam: Appears calm and comfortable, poor dentation Respiratory system: No wheezing, no increase work of breathing Cardiovascular system: rrr Gastrointestinal system: +Bs, soft Central nervous system: A+Ox3 Psychiatry: Judgement and insight appear normal. Mood & affect appropriate.     Data Reviewed: I have personally reviewed following labs and imaging studies  CBC: Recent Labs  Lab 06/26/17 1911 06/27/17 0327  WBC 8.2 7.2  NEUTROABS 4.9  --   HGB 12.7 11.3*  HCT 39.5 34.4*  MCV 88.8 89.1  PLT 258 578   Basic Metabolic Panel: Recent Labs  Lab 06/26/17 1911  06/27/17 0327  NA 137 139  K 3.6 3.0*  CL 101 104  CO2 21* 25  GLUCOSE 381* 233*  BUN 17 16  CREATININE 1.33* 1.21*  CALCIUM 8.4* 8.1*   GFR: Estimated Creatinine Clearance: 66.3 mL/min (A) (by C-G formula based on SCr of 1.21 mg/dL (H)). Liver Function Tests: Recent Labs  Lab 06/26/17 1911  AST 22  ALT 20  ALKPHOS 62   BILITOT 0.5  PROT 6.6  ALBUMIN 3.6   No results for input(s): LIPASE, AMYLASE in the last 168 hours. No results for input(s): AMMONIA in the last 168 hours. Coagulation Profile: No results for input(s): INR, PROTIME in the last 168 hours. Cardiac Enzymes: No results for input(s): CKTOTAL, CKMB, CKMBINDEX, TROPONINI in the last 168 hours. BNP (last 3 results) No results for input(s): PROBNP in the last 8760 hours. HbA1C: No results for input(s): HGBA1C in the last 72 hours. CBG: Recent Labs  Lab 06/27/17 0035 06/27/17 0608  GLUCAP 169* 168*   Lipid Profile: No results for input(s): CHOL, HDL, LDLCALC, TRIG, CHOLHDL, LDLDIRECT in the last 72 hours. Thyroid Function Tests: Recent Labs    06/26/17 2308  TSH <0.010*   Anemia Panel: No results for input(s): VITAMINB12, FOLATE, FERRITIN, TIBC, IRON, RETICCTPCT in the last 72 hours. Urine analysis:    Component Value Date/Time   COLORURINE YELLOW 06/26/2017 1944   APPEARANCEUR CLOUDY (A) 06/26/2017 1944   LABSPEC 1.014 06/26/2017 1944   PHURINE 5.0 06/26/2017 1944   GLUCOSEU 50 (A) 06/26/2017 1944   HGBUR NEGATIVE 06/26/2017 1944   BILIRUBINUR NEGATIVE 06/26/2017 1944   KETONESUR NEGATIVE 06/26/2017 1944   PROTEINUR NEGATIVE 06/26/2017 1944   UROBILINOGEN 0.2 10/07/2014 1009   NITRITE NEGATIVE 06/26/2017 1944   LEUKOCYTESUR LARGE (A) 06/26/2017 1944      Recent Results (from the past 240 hour(s))  Blood culture (routine x 2)     Status: None (Preliminary result)   Collection Time: 06/26/17 10:56 PM  Result Value Ref Range Status   Specimen Description BLOOD LEFT ANTECUBITAL  Final   Special Requests   Final    BOTTLES DRAWN AEROBIC AND ANAEROBIC Blood Culture results may not be optimal due to an excessive volume of blood received in culture bottles   Culture PENDING  Incomplete   Report Status PENDING  Incomplete      Anti-infectives (From admission, onward)   Start     Dose/Rate Route Frequency Ordered Stop    06/27/17 0000  aztreonam (AZACTAM) 1 g in sodium chloride 0.9 % 100 mL IVPB     1 g 200 mL/hr over 30 Minutes Intravenous Every 8 hours 06/26/17 2356     06/26/17 2130  ciprofloxacin (CIPRO) IVPB 400 mg  Status:  Discontinued     400 mg 200 mL/hr over 60 Minutes Intravenous  Once 06/26/17 2122 06/26/17 2314       Radiology Studies: No results found.      Scheduled Meds: . calcium-vitamin D  2 tablet Oral TID WC  . clopidogrel  75 mg Oral Daily  . enoxaparin (LOVENOX) injection  40 mg Subcutaneous Daily  . insulin aspart  0-15 Units Subcutaneous TID WC  . levothyroxine  300 mcg Oral QAC breakfast  . metoprolol tartrate  100 mg Oral BID  . pantoprazole  20 mg Oral Daily  . potassium chloride  40 mEq Oral Once  . pravastatin  20 mg Oral Daily   Continuous Infusions: . aztreonam Stopped (06/27/17 0726)  . lactated ringers 100  mL/hr at 06/27/17 0136     LOS: 0 days    Time spent: 25 min    Geradine Girt, DO [facility] Pager [phone]  If 7PM-7AM, please contact night-coverage [email or web address] Password [email or web address] 06/27/2017, 8:55 AM

## 2017-06-27 NOTE — ED Triage Notes (Signed)
Assisted Pt.to BR tol well.

## 2017-06-28 ENCOUNTER — Other Ambulatory Visit: Payer: Self-pay

## 2017-06-28 DIAGNOSIS — E039 Hypothyroidism, unspecified: Secondary | ICD-10-CM | POA: Diagnosis not present

## 2017-06-28 DIAGNOSIS — N390 Urinary tract infection, site not specified: Secondary | ICD-10-CM | POA: Diagnosis not present

## 2017-06-28 DIAGNOSIS — E872 Acidosis: Secondary | ICD-10-CM | POA: Diagnosis not present

## 2017-06-28 DIAGNOSIS — E1140 Type 2 diabetes mellitus with diabetic neuropathy, unspecified: Secondary | ICD-10-CM | POA: Diagnosis not present

## 2017-06-28 LAB — GLUCOSE, CAPILLARY
GLUCOSE-CAPILLARY: 250 mg/dL — AB (ref 65–99)
Glucose-Capillary: 195 mg/dL — ABNORMAL HIGH (ref 65–99)
Glucose-Capillary: 217 mg/dL — ABNORMAL HIGH (ref 65–99)
Glucose-Capillary: 205 mg/dL — ABNORMAL HIGH (ref 65–99)

## 2017-06-28 LAB — BASIC METABOLIC PANEL
ANION GAP: 11 (ref 5–15)
BUN: 11 mg/dL (ref 6–20)
CO2: 22 mmol/L (ref 22–32)
Calcium: 8.5 mg/dL — ABNORMAL LOW (ref 8.9–10.3)
Chloride: 105 mmol/L (ref 101–111)
Creatinine, Ser: 1.05 mg/dL — ABNORMAL HIGH (ref 0.44–1.00)
GFR, EST NON AFRICAN AMERICAN: 59 mL/min — AB (ref >60)
GLUCOSE: 214 mg/dL — AB (ref 65–99)
Potassium: 3.9 mmol/L (ref 3.5–5.1)
Sodium: 138 mmol/L (ref 135–145)

## 2017-06-28 LAB — LACTIC ACID, PLASMA: LACTIC ACID, VENOUS: 2.4 mmol/L — AB (ref 0.5–1.9)

## 2017-06-28 MED ORDER — LEVOTHYROXINE SODIUM 100 MCG PO TABS
300.0000 ug | ORAL_TABLET | Freq: Every day | ORAL | Status: DC
  Administered 2017-06-29: 300 ug via ORAL
  Filled 2017-06-28: qty 3

## 2017-06-28 MED ORDER — MAGNESIUM SULFATE 2 GM/50ML IV SOLN
2.0000 g | Freq: Once | INTRAVENOUS | Status: AC
  Administered 2017-06-28: 2 g via INTRAVENOUS
  Filled 2017-06-28: qty 50

## 2017-06-28 MED ORDER — LINAGLIPTIN 5 MG PO TABS
5.0000 mg | ORAL_TABLET | Freq: Every day | ORAL | Status: DC
  Administered 2017-06-28 – 2017-06-29 (×2): 5 mg via ORAL
  Filled 2017-06-28 (×2): qty 1

## 2017-06-28 MED ORDER — METFORMIN HCL 500 MG PO TABS
1000.0000 mg | ORAL_TABLET | Freq: Two times a day (BID) | ORAL | Status: DC
  Administered 2017-06-28 – 2017-06-29 (×2): 1000 mg via ORAL
  Filled 2017-06-28 (×2): qty 2

## 2017-06-28 MED ORDER — SODIUM CHLORIDE 0.9 % IV BOLUS
500.0000 mL | Freq: Once | INTRAVENOUS | Status: AC
  Administered 2017-06-28: 500 mL via INTRAVENOUS

## 2017-06-28 NOTE — Progress Notes (Signed)
Inpatient Diabetes Program Recommendations  AACE/ADA: New Consensus Statement on Inpatient Glycemic Control (2015)  Target Ranges:  Prepandial:   less than 140 mg/dL      Peak postprandial:   less than 180 mg/dL (1-2 hours)      Critically ill patients:  140 - 180 mg/dL   Lab Results  Component Value Date   GLUCAP 195 (H) 06/28/2017   HGBA1C 7.3 (H) 01/22/2017    Review of Glycemic Control Results for [name], [name] (MRN [id number]) as of 06/28/2017 10:33  Ref. Range 06/27/2017 12:38 06/27/2017 17:10 06/27/2017 21:58 06/28/2017 07:58  Glucose-Capillary Latest Ref Range: 65 - 99 mg/dL 349 (H) 161 (H) 210 (H) 195 (H)   Diabetes history: Type 2 DM Outpatient Diabetes medications: Metformin 1000 mg BID, Januvia 100 mg QD Current orders for Inpatient glycemic control: Novolog 0-15 units TID  Inpatient Diabetes Program Recommendations:    Last A1C 7.3 % from 12/2016, consider repeating A1C?  Also, in the setting of infection, consider adding Lantus 10 units QD.   Thanks, Bronson Curb, MSN, RNC-OB Diabetes Coordinator [phone] (8a-5p)

## 2017-06-28 NOTE — Progress Notes (Signed)
PROGRESS NOTE    [name]  OVZ:[id number] DOB: [birth year] DOA: 06/26/2017 PCP: Lin Landsman, MD   Outpatient Specialists:     Brief Narrative:  57 year old woman with medical problems including obesity, right renal mass concerning for renal cancer, chronic diastolic/preserved EF heart failure, non-insulin-dependent diabetes, history of TIA, GERD, history of thyroidectomy, thoracic aortic aneurysm, and recurrent urinary tract infections presenting with suprapubic pain.  She reports for the past 2-3 nights, she's had urinary urgency, but times has had inability to urinate. She denies any fevers, but does report taking nonsteroidal anti-inflammatory drugs around the clock for her chronic right flank pain that she attributes to her renal mass. She reports planned cryotherapy ablation with the interventional radiology team within the next few weeks. She denies dysuria, but reports these are symptoms consistent with her prior urinary tract infections.     Assessment & Plan:   Active Problems:   Type 2 diabetes mellitus (HCC)   Essential hypertension   Hypothyroid   UTI (urinary tract infection)   Severe sepsis related to urinary tract infection Acute kidney injury Lactic acidosis - recurrent urinary tract infections with PMHx of ESBL -aztreonam  -urine culture: kleb pna: await sensitivities  Hypokalemia -replace as well as Mg  -Right renal mass, concerning for malignancy: saw interventional radiology team on 06/16/2017; planning on ablation procedure when feasible  -Hepatic steatosis: seen on MRI on 06/13/2017, may be contributing to lactic acidosis with concomitant metformin use  -Diabetes:  -resume home meds  -Hx of TIA: continue home clopidogrel; she reports prior failure of ASA; continue statin  -Obesity:  -Body mass index is 40.46 kg/m.  -Thoracic aortic aneurysm: noted to be 4 cm in the past -outpatient follow up  -Hypothyroidism in setting of prior  thyroidectomy for thyroid cancer: continue home levothyroxine, -TSH low -defer to outpatient endocrinology-- has appointment in June  -GERD: continue PPI therapy  -Chronic diastolic heart failure: hold diuretics and ACEi in setting of AKI; continue BB at home dose      DVT prophylaxis:  Lovenox   Code Status: Full Code   Family Communication:   Disposition Plan:  Home once culture back   Consultants:      Subjective: No fever, no chills overnight  Objective: Vitals:   06/27/17 1439 06/27/17 2208 06/28/17 0520 06/28/17 1342  BP: (!) 144/85 135/88 (!) 115/47 (!) 144/70  Pulse: 76 80 68 65  Resp: 16 17 17 20   Temp: 98.6 F (37 C) 98.2 F (36.8 C) 98.2 F (36.8 C) 97.9 F (36.6 C)  TempSrc: Oral Oral Oral Axillary  SpO2: 99% 99% 98% 100%  Weight:      Height:        Intake/Output Summary (Last 24 hours) at 06/28/2017 1505 Last data filed at 06/28/2017 1342 Gross per 24 hour  Intake 1050 ml  Output 2901 ml  Net -1851 ml   Filed Weights   06/27/17 1025  Weight: 113.7 kg (250 lb 10.6 oz)    Examination:  General exam: NAD- appears older than stated age Respiratory system: no wheezing Cardiovascular system: rrr Gastrointestinal system: +Bs Central nervous system: A+Ox3     Data Reviewed: I have personally reviewed following labs and imaging studies  CBC: Recent Labs  Lab 06/26/17 1911 06/27/17 0327  WBC 8.2 7.2  NEUTROABS 4.9  --   HGB 12.7 11.3*  HCT 39.5 34.4*  MCV 88.8 89.1  PLT 258 412   Basic Metabolic Panel: Recent Labs  Lab 06/26/17 1911 06/27/17 0327  06/28/17 0756  NA 137 139 138  K 3.6 3.0* 3.9  CL 101 104 105  CO2 21* 25 22  GLUCOSE 381* 233* 214*  BUN 17 16 11   CREATININE 1.33* 1.21* 1.05*  CALCIUM 8.4* 8.1* 8.5*  MG  --  1.6*  --    GFR: Estimated Creatinine Clearance: 77.5 mL/min (A) (by C-G formula based on SCr of 1.05 mg/dL (H)). Liver Function Tests: Recent Labs  Lab 06/26/17 1911  AST 22  ALT 20    ALKPHOS 62  BILITOT 0.5  PROT 6.6  ALBUMIN 3.6   No results for input(s): LIPASE, AMYLASE in the last 168 hours. No results for input(s): AMMONIA in the last 168 hours. Coagulation Profile: No results for input(s): INR, PROTIME in the last 168 hours. Cardiac Enzymes: No results for input(s): CKTOTAL, CKMB, CKMBINDEX, TROPONINI in the last 168 hours. BNP (last 3 results) No results for input(s): PROBNP in the last 8760 hours. HbA1C: No results for input(s): HGBA1C in the last 72 hours. CBG: Recent Labs  Lab 06/27/17 1238 06/27/17 1710 06/27/17 2158 06/28/17 0758 06/28/17 1241  GLUCAP 349* 161* 210* 195* 250*   Lipid Profile: No results for input(s): CHOL, HDL, LDLCALC, TRIG, CHOLHDL, LDLDIRECT in the last 72 hours. Thyroid Function Tests: Recent Labs    06/26/17 2308  TSH <0.010*   Anemia Panel: No results for input(s): VITAMINB12, FOLATE, FERRITIN, TIBC, IRON, RETICCTPCT in the last 72 hours. Urine analysis:    Component Value Date/Time   COLORURINE YELLOW 06/26/2017 1944   APPEARANCEUR CLOUDY (A) 06/26/2017 1944   LABSPEC 1.014 06/26/2017 1944   PHURINE 5.0 06/26/2017 1944   GLUCOSEU 50 (A) 06/26/2017 1944   HGBUR NEGATIVE 06/26/2017 1944   BILIRUBINUR NEGATIVE 06/26/2017 1944   KETONESUR NEGATIVE 06/26/2017 1944   PROTEINUR NEGATIVE 06/26/2017 1944   UROBILINOGEN 0.2 10/07/2014 1009   NITRITE NEGATIVE 06/26/2017 1944   LEUKOCYTESUR LARGE (A) 06/26/2017 1944      Recent Results (from the past 240 hour(s))  Urine culture     Status: Abnormal (Preliminary result)   Collection Time: 06/26/17  7:44 PM  Result Value Ref Range Status   Specimen Description URINE, CATHETERIZED  Final   Special Requests   Final    NONE Performed at [facility], [place] [address], [place], [place] [address]    Culture >=100,000 COLONIES/mL KLEBSIELLA PNEUMONIAE (A)  Final   Report Status PENDING  Incomplete  Blood culture (routine x 2)     Status: None (Preliminary  result)   Collection Time: 06/26/17 10:56 PM  Result Value Ref Range Status   Specimen Description BLOOD RIGHT HAND  Final   Special Requests   Final    BOTTLES DRAWN AEROBIC AND ANAEROBIC Blood Culture adequate volume   Culture   Final    NO GROWTH 2 DAYS Performed at [facility], [place] [address], [place], [place] [address]    Report Status PENDING  Incomplete  Blood culture (routine x 2)     Status: None (Preliminary result)   Collection Time: 06/26/17 10:56 PM  Result Value Ref Range Status   Specimen Description BLOOD LEFT ANTECUBITAL  Final   Special Requests   Final    BOTTLES DRAWN AEROBIC AND ANAEROBIC Blood Culture results may not be optimal due to an excessive volume of blood received in culture bottles   Culture   Final    NO GROWTH 1 DAY Performed at [facility], [place] [address],  [place], [place] [address]    Report Status PENDING  Incomplete      Anti-infectives (From admission, onward)   Start     Dose/Rate Route Frequency Ordered Stop   06/27/17 0000  aztreonam (AZACTAM) 1 g in sodium chloride 0.9 % 100 mL IVPB     1 g 200 mL/hr over 30 Minutes Intravenous Every 8 hours 06/26/17 2356     06/26/17 2130  ciprofloxacin (CIPRO) IVPB 400 mg  Status:  Discontinued     400 mg 200 mL/hr over 60 Minutes Intravenous  Once 06/26/17 2122 06/26/17 2314       Radiology Studies: No results found.      Scheduled Meds: . calcium-vitamin D  2 tablet Oral TID WC  . clopidogrel  75 mg Oral Daily  . enoxaparin (LOVENOX) injection  40 mg Subcutaneous Daily  . insulin aspart  0-15 Units Subcutaneous TID WC  . [START ON 06/29/2017] levothyroxine  300 mcg Oral QAC breakfast  . linagliptin  5 mg Oral Daily  . metFORMIN  1,000 mg Oral BID WC  . metoprolol tartrate  100 mg Oral BID  . pantoprazole  20 mg Oral Daily  . pravastatin  20 mg Oral Daily   Continuous Infusions: . aztreonam Stopped (06/28/17 1330)     LOS: 0 days    Time spent: 15  min    Geradine Girt, DO [facility] Pager [phone]  If 7PM-7AM, please contact night-coverage [email or web address] Password [facility] 06/28/2017, 3:05 PM

## 2017-06-29 ENCOUNTER — Other Ambulatory Visit (HOSPITAL_COMMUNITY): Payer: Self-pay | Admitting: Interventional Radiology

## 2017-06-29 DIAGNOSIS — N390 Urinary tract infection, site not specified: Secondary | ICD-10-CM | POA: Diagnosis not present

## 2017-06-29 DIAGNOSIS — N289 Disorder of kidney and ureter, unspecified: Secondary | ICD-10-CM

## 2017-06-29 DIAGNOSIS — I10 Essential (primary) hypertension: Secondary | ICD-10-CM | POA: Diagnosis not present

## 2017-06-29 DIAGNOSIS — E1140 Type 2 diabetes mellitus with diabetic neuropathy, unspecified: Secondary | ICD-10-CM | POA: Diagnosis not present

## 2017-06-29 DIAGNOSIS — E039 Hypothyroidism, unspecified: Secondary | ICD-10-CM | POA: Diagnosis not present

## 2017-06-29 LAB — URINE CULTURE

## 2017-06-29 LAB — GLUCOSE, CAPILLARY
GLUCOSE-CAPILLARY: 167 mg/dL — AB (ref 65–99)
Glucose-Capillary: 189 mg/dL — ABNORMAL HIGH (ref 65–99)

## 2017-06-29 MED ORDER — POTASSIUM CHLORIDE CRYS ER 20 MEQ PO TBCR
40.0000 meq | EXTENDED_RELEASE_TABLET | Freq: Two times a day (BID) | ORAL | Status: DC
  Administered 2017-06-29: 40 meq via ORAL
  Filled 2017-06-29: qty 2

## 2017-06-29 MED ORDER — CEPHALEXIN 500 MG PO CAPS: 500.0000 mg | ORAL_CAPSULE | Freq: Two times a day (BID) | ORAL | 0 refills | Status: DC

## 2017-06-29 MED ORDER — ACETAMINOPHEN 500 MG PO TABS: 1000.0000 mg | ORAL_TABLET | Freq: Four times a day (QID) | ORAL | 0 refills | Status: AC | PRN

## 2017-06-29 MED ORDER — CEPHALEXIN 500 MG PO CAPS
500.0000 mg | ORAL_CAPSULE | Freq: Two times a day (BID) | ORAL | Status: DC
  Administered 2017-06-29: 500 mg via ORAL
  Filled 2017-06-29: qty 1

## 2017-06-29 MED ORDER — FUROSEMIDE 80 MG PO TABS
80.0000 mg | ORAL_TABLET | Freq: Two times a day (BID) | ORAL | Status: DC
  Administered 2017-06-29: 80 mg via ORAL
  Filled 2017-06-29: qty 1

## 2017-06-29 MED ORDER — MAGNESIUM OXIDE 400 (241.3 MG) MG PO TABS: 400.0000 mg | ORAL_TABLET | Freq: Every day | ORAL | 0 refills | Status: DC

## 2017-06-29 MED ORDER — MAGNESIUM OXIDE 400 (241.3 MG) MG PO TABS
400.0000 mg | ORAL_TABLET | Freq: Every day | ORAL | Status: DC
  Administered 2017-06-29: 400 mg via ORAL
  Filled 2017-06-29: qty 1

## 2017-06-29 NOTE — Discharge Summary (Signed)
Physician Discharge Summary  Laqueisha Catalina NWG:[id number] DOB: 10/15/1960 DOA: 06/26/2017  PCP: Lin Landsman, MD  Admit date: 06/26/2017 Discharge date: 06/29/2017   Recommendations for Outpatient Follow-Up:   Finish abx course Defer Adjustment of synthroid to endocrinology  Discharge Diagnosis:   Active Problems:   Type 2 diabetes mellitus ([place])   Essential hypertension   Hypothyroid   UTI (urinary tract infection)   Discharge disposition:  Home  Discharge Condition: Improved.  Diet recommendation: Low sodium, heart healthy.  Carbohydrate-modified  Wound care: None.   History of Present Illness:   57 year old woman with medical problems including obesity, right renal mass concerning for renal cancer, chronic diastolic/preserved EF heart failure, non-insulin-dependent diabetes, history of TIA, GERD, history of thyroidectomy, thoracic aortic aneurysm, and recurrent urinary tract infections presenting with suprapubic pain.  She reports for the past 2-3 nights, she's had urinary urgency, but times has had inability to urinate. She denies any fevers, but does report taking nonsteroidal anti-inflammatory drugs around the clock for her chronic right flank pain that she attributes to her renal mass. She reports planned cryotherapy ablation with the interventional radiology team within the next few weeks. She denies dysuria, but reports these are symptoms consistent with her prior urinary tract infections. She reports a history of what sounds like anaphylaxis/angioedema to Augmentin. She cites the past year she was discharged on Augmentin, this resulted in hives, nausea and vomiting acute onset, swelling of her mouth and throat, and shortness of breath.  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 She is a nonsmoker.  Of note, she was admitted last in October 2018 for urinary tract infection. Prior cultures reviewed, in the past  she has had Cipro resistant organisms     Hospital Course by Problem:   sepsis related to urinary tract infection- Klebsiella Acute kidney injury Lactic acidosis - recurrent urinary tract infections with PMHx of ESBL -aztreonam -- change to keflex  Hypokalemia -replaced as well as Mg  -Right renal mass, concerning for malignancy: saw interventional radiology team on 06/16/2017; planning on ablation procedure when feasible  -Hepatic steatosis: seen on MRI on 06/13/2017, may be contributing to lactic acidosis with concomitant metformin use  -Diabetes:  -resume home meds  -Hx of TIA: continue home clopidogrel; she reports prior failure of ASA; continue statin  -Obesity:  -Body mass index is 40.46 kg/m.  -Thoracic aortic aneurysm: noted to be 4 cm in the past -outpatient follow up  -Hypothyroidism in setting of prior thyroidectomy for thyroid cancer: continue home levothyroxine, -TSH low -defer to outpatient endocrinology-- has appointment in June  -GERD: continue PPI therapy  -Chronic diastolic heart failure -resume home meds      Medical Consultants:    None.   Discharge Exam:   Vitals:   06/28/17 1342 06/28/17 2123  BP: (!) 144/70 131/68  Pulse: 65 66  Resp: 20 18  Temp: 97.9 F (36.6 C) 98.3 F (36.8 C)  SpO2: 100% 99%   Vitals:   06/27/17 2208 06/28/17 0520 06/28/17 1342 06/28/17 2123  BP: 135/88 (!) 115/47 (!) 144/70 131/68  Pulse: 80 68 65 66  Resp: 17 17 20 18   Temp: 98.2 F (36.8 C) 98.2 F (36.8 C) 97.9 F (36.6 C) 98.3 F (36.8 C)  TempSrc: Oral Oral Axillary Oral  SpO2: 99% 98% 100% 99%  Weight:      Height:        Gen:  NAD    The  results of significant diagnostics from this hospitalization (including imaging, microbiology, ancillary and laboratory) are listed below for reference.     Procedures and Diagnostic Studies:   No results found.   Labs:   Basic Metabolic Panel: Recent Labs  Lab 06/26/17 1911  06/27/17 0327 06/28/17 0756  NA 137 139 138  K 3.6 3.0* 3.9  CL 101 104 105  CO2 21* 25 22  GLUCOSE 381* 233* 214*  BUN 17 16 11   CREATININE 1.33* 1.21* 1.05*  CALCIUM 8.4* 8.1* 8.5*  MG  --  1.6*  --    GFR Estimated Creatinine Clearance: 77.5 mL/min (A) (by C-G formula based on SCr of 1.05 mg/dL (H)). Liver Function Tests: Recent Labs  Lab 06/26/17 1911  AST 22  ALT 20  ALKPHOS 62  BILITOT 0.5  PROT 6.6  ALBUMIN 3.6   No results for input(s): LIPASE, AMYLASE in the last 168 hours. No results for input(s): AMMONIA in the last 168 hours. Coagulation profile No results for input(s): INR, PROTIME in the last 168 hours.  CBC: Recent Labs  Lab 06/26/17 1911 06/27/17 0327  WBC 8.2 7.2  NEUTROABS 4.9  --   HGB 12.7 11.3*  HCT 39.5 34.4*  MCV 88.8 89.1  PLT 258 230   Cardiac Enzymes: No results for input(s): CKTOTAL, CKMB, CKMBINDEX, TROPONINI in the last 168 hours. BNP: Invalid input(s): POCBNP CBG: Recent Labs  Lab 06/27/17 2158 06/28/17 0758 06/28/17 1241 06/28/17 1802 06/28/17 2129  GLUCAP 210* 195* 250* 217* 205*   D-Dimer No results for input(s): DDIMER in the last 72 hours. Hgb A1c No results for input(s): HGBA1C in the last 72 hours. Lipid Profile No results for input(s): CHOL, HDL, LDLCALC, TRIG, CHOLHDL, LDLDIRECT in the last 72 hours. Thyroid function studies Recent Labs    06/26/17 2308  TSH <0.010*   Anemia work up No results for input(s): VITAMINB12, FOLATE, FERRITIN, TIBC, IRON, RETICCTPCT in the last 72 hours. Microbiology Recent Results (from the past 240 hour(s))  Urine culture     Status: Abnormal   Collection Time: 06/26/17  7:44 PM  Result Value Ref Range Status   Specimen Description URINE, CATHETERIZED  Final   Special Requests   Final    NONE Performed at [facility], [address] [place], [place] [address]    Culture >=100,000 COLONIES/mL KLEBSIELLA PNEUMONIAE (A)  Final   Report Status 06/29/2017 FINAL   Final   Organism ID, Bacteria KLEBSIELLA PNEUMONIAE (A)  Final      Susceptibility   Klebsiella pneumoniae - MIC*    AMPICILLIN >=32 RESISTANT Resistant     CEFAZOLIN <=4 SENSITIVE Sensitive     CEFTRIAXONE <=1 SENSITIVE Sensitive     CIPROFLOXACIN <=0.25 SENSITIVE Sensitive     GENTAMICIN <=1 SENSITIVE Sensitive     IMIPENEM 0.5 SENSITIVE Sensitive     NITROFURANTOIN 64 INTERMEDIATE Intermediate     TRIMETH/SULFA <=20 SENSITIVE Sensitive     AMPICILLIN/SULBACTAM 4 SENSITIVE Sensitive     PIP/TAZO <=4 SENSITIVE Sensitive     Extended ESBL NEGATIVE Sensitive     * >=100,000 COLONIES/mL KLEBSIELLA PNEUMONIAE  Blood culture (routine x 2)     Status: None (Preliminary result)   Collection Time: 06/26/17 10:56 PM  Result Value Ref Range Status   Specimen Description BLOOD RIGHT HAND  Final   Special Requests   Final    BOTTLES DRAWN AEROBIC AND ANAEROBIC Blood Culture adequate volume   Culture   Final  NO GROWTH 2 DAYS Performed at [facility], [place] [address] [place], [place] [address]    Report Status PENDING  Incomplete  Blood culture (routine x 2)     Status: None (Preliminary result)   Collection Time: 06/26/17 10:56 PM  Result Value Ref Range Status   Specimen Description BLOOD LEFT ANTECUBITAL  Final   Special Requests   Final    BOTTLES DRAWN AEROBIC AND ANAEROBIC Blood Culture results may not be optimal due to an excessive volume of blood received in culture bottles   Culture   Final    NO GROWTH 1 DAY Performed at [facility], [place] [address] [place], [place] [address]    Report Status PENDING  Incomplete     Discharge Instructions:   Discharge Instructions    Diet - low sodium heart healthy   Complete by:  As directed    Diet Carb Modified   Complete by:  As directed    Increase activity slowly   Complete by:  As directed      Allergies as of 06/29/2017      Reactions   Lorabid [loracarbef] Anaphylaxis   Augmentin [amoxicillin-pot  Clavulanate] Hives, Nausea And Vomiting, Other (See Comments)   Chest pain   Codeine Nausea And Vomiting   Gadolinium Derivatives Itching, Swelling   Patient will require 13 hr prep prior to administration of gadolinium     Sulfa Drugs Cross Reactors Other (See Comments)   Severe allergic reaction as a child - was not told symptoms   Benicar [olmesartan Medoxomil] Swelling, Rash   Other Rash   Green peas      Medication List    STOP taking these medications   ibuprofen 200 MG tablet Commonly known as:  ADVIL,MOTRIN   miconazole 2 % cream Commonly known as:  MICOTIN   miconazole 200 MG vaginal suppository Commonly known as:  MICOTIN   phenazopyridine 200 MG tablet Commonly known as:  PYRIDIUM     TAKE these medications   acetaminophen 500 MG tablet Commonly known as:  TYLENOL Take 2 tablets (1,000 mg total) by mouth every 6 (six) hours as needed (body aches). What changed:  when to take this   albuterol 108 (90 Base) MCG/ACT inhaler Commonly known as:  PROVENTIL HFA;VENTOLIN HFA Inhale 2 puffs into the lungs every 4 (four) hours as needed for wheezing or shortness of breath.   calcium-vitamin D 500-200 MG-UNIT tablet Commonly known as:  OSCAL WITH D Take 2 tablets by mouth 3 (three) times daily.   cephALEXin 500 MG capsule Commonly known as:  KEFLEX Take 1 capsule (500 mg total) by mouth every 12 (twelve) hours.   clopidogrel 75 MG tablet Commonly known as:  PLAVIX Take 1 tablet (75 mg total) by mouth daily.   diphenhydrAMINE 25 MG tablet Commonly known as:  BENADRYL Take 25-50 mg by mouth at bedtime. What changed:  Another medication with the same name was removed. Continue taking this medication, and follow the directions you see here.   FISH OIL PO Take 1 capsule by mouth daily.   furosemide 40 MG tablet Commonly known as:  LASIX Take 2 tablets (80 mg total) by mouth 2 (two) times daily.   lansoprazole 15 MG capsule Commonly known as:  PREVACID Take 1  capsule (15 mg total) by mouth daily at 12 noon.   levothyroxine 300 MCG tablet Commonly known as:  SYNTHROID, LEVOTHROID Take 300 mcg by mouth daily before breakfast.  lisinopril 10 MG tablet Commonly known as:  PRINIVIL,ZESTRIL Take 10 mg by mouth daily.   magnesium oxide 400 (241.3 Mg) MG tablet Commonly known as:  MAG-OX Take 1 tablet (400 mg total) by mouth daily.   metFORMIN 1000 MG tablet Commonly known as:  GLUCOPHAGE Take 1 tablet (1,000 mg total) by mouth 2 (two) times daily with a meal.   metoprolol tartrate 100 MG tablet Commonly known as:  LOPRESSOR Take 100 mg by mouth 2 (two) times daily.   ondansetron 8 MG disintegrating tablet Commonly known as:  ZOFRAN-ODT Take 8 mg by mouth 3 (three) times daily as needed for nausea or vomiting.   potassium chloride SA 20 MEQ tablet Commonly known as:  K-DUR,KLOR-CON Take 40 mEq by mouth 2 (two) times daily.   pravastatin 20 MG tablet Commonly known as:  PRAVACHOL Take 20 mg by mouth daily.   PROBIOTIC PO Take 1 tablet by mouth daily.   sitaGLIPtin 100 MG tablet Commonly known as:  JANUVIA Take 100 mg by mouth daily.      Follow-up Information    Lin Landsman, MD Follow up in 1 week(s).   Specialty:  Family Medicine Contact information: [place] [address] [phone]            Time coordinating discharge: 35 min  Signed:  Geradine Girt   [facility] 06/29/2017, 9:01 AM

## 2017-06-29 NOTE — Progress Notes (Signed)
1230 Patient provided discharge instructions and verbalizes understanding of discharge medications and follow up MD appointments. Patient will be transferred by spouse.

## 2017-07-01 LAB — CULTURE, BLOOD (ROUTINE X 2)
Culture: NO GROWTH
Special Requests: ADEQUATE

## 2017-07-02 LAB — CULTURE, BLOOD (ROUTINE X 2): CULTURE: NO GROWTH

## 2017-08-01 NOTE — Progress Notes (Signed)
05-17-16 (Epic) EKG

## 2017-08-01 NOTE — Patient Instructions (Addendum)
[name]  08/01/2017   Your procedure is scheduled on: 08-05-17   Report to [facility] Main  Entrance     Report to Interventional Radiology at 10:00 AM    Call this number if you have problems the morning of surgery [phone]   Remember: Do not eat food or drink liquids :After Midnight.     Take these medicines the morning of surgery with A SIP OF WATER: Levothyroxine (Synthroid), and Metoprolol Tartrate ([place])                                You may not have any metal on your body including hair pins and              piercings  Do not wear jewelry, make-up, lotions, powders or perfumes, deodorant             Do not wear nail polish.  Do not shave  48 hours prior to surgery.               Do not bring valuables to the hospital. [place].  Contacts, dentures or bridgework may not be worn into surgery.  Leave suitcase in the car. After surgery it may be brought to your room.      Special Instructions: N/A              Please read over the following fact sheets you were given: _____________________________________________________________________  How to Manage Your Diabetes Before and After Surgery  Why is it important to control my blood sugar before and after surgery? . Improving blood sugar levels before and after surgery helps healing and can limit problems. . A way of improving blood sugar control is eating a healthy diet by: o  Eating less sugar and carbohydrates o  Increasing activity/exercise o  Talking with your doctor about reaching your blood sugar goals . High blood sugars (greater than 180 mg/dL) can raise your risk of infections and slow your recovery, so you will need to focus on controlling your diabetes during the weeks before surgery. . Make sure that the doctor who takes care of your diabetes knows about your planned surgery including the date and location.  How do I  manage my blood sugar before surgery? . Check your blood sugar at least 4 times a day, starting 2 days before surgery, to make sure that the level is not too high or low. o Check your blood sugar the morning of your surgery when you wake up and every 2 hours until you get to the Short Stay unit. . If your blood sugar is less than 70 mg/dL, you will need to treat for low blood sugar: o Do not take insulin. o Treat a low blood sugar (less than 70 mg/dL) with  cup of clear juice (cranberry or apple), 4 glucose tablets, OR glucose gel. o Recheck blood sugar in 15 minutes after treatment (to make sure it is greater than 70 mg/dL). If your blood sugar is not greater than 70 mg/dL on recheck, call [phone] for further instructions. . Report your blood sugar to the short stay nurse when you get to Short Stay.  . If you are admitted to the hospital after surgery: o  Your blood sugar will be checked by the staff and you will probably be given insulin after surgery (instead of oral diabetes medicines) to make sure you have good blood sugar levels. o The goal for blood sugar control after surgery is 80-180 mg/dL.   WHAT DO I DO ABOUT MY DIABETES MEDICATION?  Marland Kitchen Do not take oral diabetes medicines (pills) the morning of surgery.  . THE DAY BEFORE SURGERY, take your usual dose of Metformin and Januvia        Reviewed and Endorsed by [facility] Patient Education Committee, August 2015          [facility] - Preparing for Surgery Before surgery, you can play an important role.  Because skin is not sterile, your skin needs to be as free of germs as possible.  You can reduce the number of germs on your skin by washing with CHG (chlorahexidine gluconate) soap before surgery.  CHG is an antiseptic cleaner which kills germs and bonds with the skin to continue killing germs even after washing. Please DO NOT use if you have an allergy to CHG or antibacterial soaps.  If your skin becomes reddened/irritated  stop using the CHG and inform your nurse when you arrive at Short Stay. Do not shave (including legs and underarms) for at least 48 hours prior to the first CHG shower.  You may shave your face/neck. Please follow these instructions carefully:  1.  Shower with CHG Soap the night before surgery and the  morning of Surgery.  2.  If you choose to wash your hair, wash your hair first as usual with your  normal  shampoo.  3.  After you shampoo, rinse your hair and body thoroughly to remove the  shampoo.                           4.  Use CHG as you would any other liquid soap.  You can apply chg directly  to the skin and wash                       Gently with a scrungie or clean washcloth.  5.  Apply the CHG Soap to your body ONLY FROM THE NECK DOWN.   Do not use on face/ open                           Wound or open sores. Avoid contact with eyes, ears mouth and genitals (private parts).                       Wash face,  Genitals (private parts) with your normal soap.             6.  Wash thoroughly, paying special attention to the area where your surgery  will be performed.  7.  Thoroughly rinse your body with warm water from the neck down.  8.  DO NOT shower/wash with your normal soap after using and rinsing off  the CHG Soap.                9.  Pat yourself dry with a clean towel.            10.  Wear clean pajamas.            11.  Place clean sheets on your bed the night of your first shower  and do not  sleep with pets. Day of Surgery : Do not apply any lotions/deodorants the morning of surgery.  Please wear clean clothes to the hospital/surgery center.  FAILURE TO FOLLOW THESE INSTRUCTIONS MAY RESULT IN THE CANCELLATION OF YOUR SURGERY PATIENT SIGNATURE_________________________________  NURSE SIGNATURE__________________________________  ________________________________________________________________________

## 2017-08-02 ENCOUNTER — Other Ambulatory Visit: Payer: Self-pay

## 2017-08-02 ENCOUNTER — Encounter (HOSPITAL_COMMUNITY): Payer: Self-pay

## 2017-08-02 ENCOUNTER — Encounter (HOSPITAL_COMMUNITY)
Admission: RE | Admit: 2017-08-02 | Discharge: 2017-08-02 | Disposition: A | Discharge status: Home / Self Care | Payer: BC Managed Care – PPO | Source: Ambulatory Visit | Attending: Interventional Radiology | Admitting: Interventional Radiology

## 2017-08-02 DIAGNOSIS — R9431 Abnormal electrocardiogram [ECG] [EKG]: Secondary | ICD-10-CM | POA: Insufficient documentation

## 2017-08-02 DIAGNOSIS — E119 Type 2 diabetes mellitus without complications: Secondary | ICD-10-CM | POA: Diagnosis not present

## 2017-08-02 DIAGNOSIS — I10 Essential (primary) hypertension: Secondary | ICD-10-CM | POA: Diagnosis not present

## 2017-08-02 DIAGNOSIS — Z01810 Encounter for preprocedural cardiovascular examination: Secondary | ICD-10-CM | POA: Insufficient documentation

## 2017-08-02 DIAGNOSIS — Z01812 Encounter for preprocedural laboratory examination: Secondary | ICD-10-CM | POA: Insufficient documentation

## 2017-08-02 LAB — BASIC METABOLIC PANEL
Anion gap: 12 (ref 5–15)
BUN: 21 mg/dL — AB (ref 6–20)
CALCIUM: 9 mg/dL (ref 8.9–10.3)
CO2: 25 mmol/L (ref 22–32)
Chloride: 103 mmol/L (ref 101–111)
Creatinine, Ser: 1.1 mg/dL — ABNORMAL HIGH (ref 0.44–1.00)
GFR calc Af Amer: >60 mL/min (ref >60)
GFR, EST NON AFRICAN AMERICAN: 55 mL/min — AB (ref >60)
GLUCOSE: 213 mg/dL — AB (ref 65–99)
Potassium: 3.9 mmol/L (ref 3.5–5.1)
SODIUM: 140 mmol/L (ref 135–145)

## 2017-08-02 LAB — CBC
HCT: 40 % (ref 36.0–46.0)
Hemoglobin: 13 g/dL (ref 12.0–15.0)
MCH: 29.3 pg (ref 26.0–34.0)
MCHC: 32.5 g/dL (ref 30.0–36.0)
MCV: 90.3 fL (ref 78.0–100.0)
Platelets: 284 10*3/uL (ref 150–400)
RBC: 4.43 MIL/uL (ref 3.87–5.11)
RDW: 13.7 % (ref 11.5–15.5)
WBC: 6.8 10*3/uL (ref 4.0–10.5)

## 2017-08-02 LAB — GLUCOSE, CAPILLARY: GLUCOSE-CAPILLARY: 237 mg/dL — AB (ref 65–99)

## 2017-08-02 LAB — HEMOGLOBIN A1C
HEMOGLOBIN A1C: 7.8 % — AB (ref 4.8–5.6)
MEAN PLASMA GLUCOSE: 177.16 mg/dL

## 2017-08-04 ENCOUNTER — Other Ambulatory Visit: Payer: Self-pay | Admitting: Radiology

## 2017-08-05 ENCOUNTER — Ambulatory Visit (HOSPITAL_COMMUNITY): Payer: BC Managed Care – PPO | Admitting: Certified Registered Nurse Anesthetist

## 2017-08-05 ENCOUNTER — Encounter (HOSPITAL_COMMUNITY): Payer: Self-pay | Admitting: *Deleted

## 2017-08-05 ENCOUNTER — Encounter (HOSPITAL_COMMUNITY): Payer: Self-pay

## 2017-08-05 ENCOUNTER — Observation Stay (HOSPITAL_COMMUNITY)
Admission: RE | Admit: 2017-08-05 | Discharge: 2017-08-06 | Disposition: A | Discharge status: Home / Self Care | Payer: BC Managed Care – PPO | Source: Ambulatory Visit | Attending: Interventional Radiology | Admitting: Interventional Radiology

## 2017-08-05 ENCOUNTER — Ambulatory Visit (HOSPITAL_COMMUNITY)
Admission: RE | Admit: 2017-08-05 | Discharge: 2017-08-05 | Disposition: A | Discharge status: Home / Self Care | Payer: BC Managed Care – PPO | Source: Ambulatory Visit | Attending: Interventional Radiology | Admitting: Interventional Radiology

## 2017-08-05 ENCOUNTER — Encounter (HOSPITAL_COMMUNITY)
Admission: RE | Disposition: A | Discharge status: Home / Self Care | Payer: Self-pay | Source: Ambulatory Visit | Attending: Interventional Radiology

## 2017-08-05 ENCOUNTER — Other Ambulatory Visit: Payer: Self-pay

## 2017-08-05 DIAGNOSIS — Z885 Allergy status to narcotic agent status: Secondary | ICD-10-CM | POA: Insufficient documentation

## 2017-08-05 DIAGNOSIS — E119 Type 2 diabetes mellitus without complications: Secondary | ICD-10-CM | POA: Diagnosis not present

## 2017-08-05 DIAGNOSIS — E7800 Pure hypercholesterolemia, unspecified: Secondary | ICD-10-CM | POA: Diagnosis not present

## 2017-08-05 DIAGNOSIS — Z79899 Other long term (current) drug therapy: Secondary | ICD-10-CM | POA: Insufficient documentation

## 2017-08-05 DIAGNOSIS — I10 Essential (primary) hypertension: Secondary | ICD-10-CM | POA: Insufficient documentation

## 2017-08-05 DIAGNOSIS — N289 Disorder of kidney and ureter, unspecified: Principal | ICD-10-CM | POA: Insufficient documentation

## 2017-08-05 DIAGNOSIS — Z8673 Personal history of transient ischemic attack (TIA), and cerebral infarction without residual deficits: Secondary | ICD-10-CM | POA: Diagnosis not present

## 2017-08-05 DIAGNOSIS — Z7984 Long term (current) use of oral hypoglycemic drugs: Secondary | ICD-10-CM | POA: Insufficient documentation

## 2017-08-05 DIAGNOSIS — C641 Malignant neoplasm of right kidney, except renal pelvis: Secondary | ICD-10-CM | POA: Diagnosis present

## 2017-08-05 DIAGNOSIS — Z881 Allergy status to other antibiotic agents status: Secondary | ICD-10-CM | POA: Diagnosis not present

## 2017-08-05 DIAGNOSIS — Z79890 Hormone replacement therapy (postmenopausal): Secondary | ICD-10-CM | POA: Diagnosis not present

## 2017-08-05 DIAGNOSIS — J45909 Unspecified asthma, uncomplicated: Secondary | ICD-10-CM | POA: Diagnosis not present

## 2017-08-05 DIAGNOSIS — Z880 Allergy status to penicillin: Secondary | ICD-10-CM | POA: Diagnosis not present

## 2017-08-05 DIAGNOSIS — Z01818 Encounter for other preprocedural examination: Secondary | ICD-10-CM

## 2017-08-05 DIAGNOSIS — Z882 Allergy status to sulfonamides status: Secondary | ICD-10-CM | POA: Insufficient documentation

## 2017-08-05 HISTORY — PX: RADIOFREQUENCY ABLATION: SHX2290

## 2017-08-05 HISTORY — DX: Dyspnea, unspecified: R06.00

## 2017-08-05 LAB — GLUCOSE, CAPILLARY
GLUCOSE-CAPILLARY: 197 mg/dL — AB (ref 65–99)
Glucose-Capillary: 321 mg/dL — ABNORMAL HIGH (ref 65–99)

## 2017-08-05 LAB — CBC WITH DIFFERENTIAL/PLATELET
BASOS PCT: 0 %
Basophils Absolute: 0 10*3/uL (ref 0.0–0.1)
EOS ABS: 0.1 10*3/uL (ref 0.0–0.7)
EOS PCT: 2 %
HCT: 41.3 % (ref 36.0–46.0)
HEMOGLOBIN: 13.7 g/dL (ref 12.0–15.0)
Lymphocytes Relative: 24 %
Lymphs Abs: 2 10*3/uL (ref 0.7–4.0)
MCH: 29.7 pg (ref 26.0–34.0)
MCHC: 33.2 g/dL (ref 30.0–36.0)
MCV: 89.6 fL (ref 78.0–100.0)
MONO ABS: 0.5 10*3/uL (ref 0.1–1.0)
Monocytes Relative: 6 %
Neutro Abs: 5.7 10*3/uL (ref 1.7–7.7)
Neutrophils Relative %: 68 %
Platelets: 294 10*3/uL (ref 150–400)
RBC: 4.61 MIL/uL (ref 3.87–5.11)
RDW: 13.7 % (ref 11.5–15.5)
WBC: 8.4 10*3/uL (ref 4.0–10.5)

## 2017-08-05 LAB — BASIC METABOLIC PANEL
Anion gap: 14 (ref 5–15)
BUN: 17 mg/dL (ref 6–20)
CO2: 22 mmol/L (ref 22–32)
CREATININE: 1.01 mg/dL — AB (ref 0.44–1.00)
Calcium: 8.8 mg/dL — ABNORMAL LOW (ref 8.9–10.3)
Chloride: 104 mmol/L (ref 101–111)
GFR calc Af Amer: >60 mL/min (ref >60)
GLUCOSE: 228 mg/dL — AB (ref 65–99)
Potassium: 3.6 mmol/L (ref 3.5–5.1)
SODIUM: 140 mmol/L (ref 135–145)

## 2017-08-05 LAB — ABO/RH: ABO/RH(D): A POS

## 2017-08-05 LAB — TYPE AND SCREEN
ABO/RH(D): A POS
ANTIBODY SCREEN: NEGATIVE

## 2017-08-05 LAB — PROTIME-INR
INR: 0.99
PROTHROMBIN TIME: 13 s (ref 11.4–15.2)

## 2017-08-05 SURGERY — RADIO FREQUENCY ABLATION
Anesthesia: General | Laterality: Right

## 2017-08-05 MED ORDER — PANTOPRAZOLE SODIUM 20 MG PO TBEC
20.0000 mg | DELAYED_RELEASE_TABLET | Freq: Every day | ORAL | Status: DC
  Administered 2017-08-05 – 2017-08-06 (×2): 20 mg via ORAL
  Filled 2017-08-05 (×2): qty 1

## 2017-08-05 MED ORDER — ALBUTEROL SULFATE (2.5 MG/3ML) 0.083% IN NEBU: 3.0000 mL | INHALATION_SOLUTION | RESPIRATORY_TRACT | Status: DC | PRN

## 2017-08-05 MED ORDER — NALOXONE HCL 0.4 MG/ML IJ SOLN: 0.4000 mg | INTRAMUSCULAR | Status: DC | PRN

## 2017-08-05 MED ORDER — LIDOCAINE 2% (20 MG/ML) 5 ML SYRINGE
INTRAMUSCULAR | Status: DC | PRN
  Administered 2017-08-05: 100 mg via INTRAVENOUS

## 2017-08-05 MED ORDER — LEVOTHYROXINE SODIUM 150 MCG PO TABS
300.0000 ug | ORAL_TABLET | Freq: Every day | ORAL | Status: DC
  Administered 2017-08-06: 300 ug via ORAL
  Filled 2017-08-05: qty 2
  Filled 2017-08-05: qty 6

## 2017-08-05 MED ORDER — LINAGLIPTIN 5 MG PO TABS
5.0000 mg | ORAL_TABLET | Freq: Every day | ORAL | Status: DC
  Administered 2017-08-06: 5 mg via ORAL
  Filled 2017-08-05: qty 1

## 2017-08-05 MED ORDER — METOPROLOL TARTRATE 50 MG PO TABS
100.0000 mg | ORAL_TABLET | Freq: Two times a day (BID) | ORAL | Status: DC
  Administered 2017-08-05 – 2017-08-06 (×2): 100 mg via ORAL
  Filled 2017-08-05 (×2): qty 2

## 2017-08-05 MED ORDER — IOHEXOL 300 MG/ML  SOLN
100.0000 mL | Freq: Once | INTRAMUSCULAR | Status: AC | PRN
  Administered 2017-08-05: 100 mL via INTRAVENOUS

## 2017-08-05 MED ORDER — FENTANYL CITRATE (PF) 250 MCG/5ML IJ SOLN
INTRAMUSCULAR | Status: AC
  Filled 2017-08-05: qty 5

## 2017-08-05 MED ORDER — DIPHENHYDRAMINE HCL 50 MG/ML IJ SOLN: 12.5000 mg | Freq: Four times a day (QID) | INTRAMUSCULAR | Status: DC | PRN

## 2017-08-05 MED ORDER — METFORMIN HCL 500 MG PO TABS
1000.0000 mg | ORAL_TABLET | Freq: Two times a day (BID) | ORAL | Status: DC
  Administered 2017-08-06: 1000 mg via ORAL
  Filled 2017-08-05: qty 2

## 2017-08-05 MED ORDER — ONDANSETRON HCL 4 MG/2ML IJ SOLN
4.0000 mg | Freq: Four times a day (QID) | INTRAMUSCULAR | Status: DC | PRN
  Administered 2017-08-05: 4 mg via INTRAVENOUS
  Filled 2017-08-05 (×2): qty 2

## 2017-08-05 MED ORDER — ONDANSETRON HCL 4 MG/2ML IJ SOLN
4.0000 mg | Freq: Once | INTRAMUSCULAR | Status: AC | PRN
  Administered 2017-08-05: 4 mg via INTRAVENOUS

## 2017-08-05 MED ORDER — INSULIN ASPART 100 UNIT/ML ~~LOC~~ SOLN: 0.0000 [IU] | Freq: Three times a day (TID) | SUBCUTANEOUS | Status: DC

## 2017-08-05 MED ORDER — SODIUM CHLORIDE 0.9 % IV SOLN
INTRAVENOUS | Status: AC
  Filled 2017-08-05: qty 1000

## 2017-08-05 MED ORDER — MIDAZOLAM HCL 5 MG/5ML IJ SOLN
INTRAMUSCULAR | Status: DC | PRN
  Administered 2017-08-05: 2 mg via INTRAVENOUS

## 2017-08-05 MED ORDER — DEXAMETHASONE SODIUM PHOSPHATE 10 MG/ML IJ SOLN
INTRAMUSCULAR | Status: DC | PRN
  Administered 2017-08-05: 5 mg via INTRAVENOUS

## 2017-08-05 MED ORDER — ONDANSETRON HCL 4 MG/2ML IJ SOLN
INTRAMUSCULAR | Status: AC
  Administered 2017-08-05: 4 mg
  Filled 2017-08-05: qty 2

## 2017-08-05 MED ORDER — FENTANYL CITRATE (PF) 100 MCG/2ML IJ SOLN
INTRAMUSCULAR | Status: DC | PRN
  Administered 2017-08-05 (×2): 50 ug via INTRAVENOUS
  Administered 2017-08-05: 100 ug via INTRAVENOUS
  Administered 2017-08-05: 50 ug via INTRAVENOUS

## 2017-08-05 MED ORDER — PRAVASTATIN SODIUM 20 MG PO TABS
20.0000 mg | ORAL_TABLET | Freq: Every day | ORAL | Status: DC
  Administered 2017-08-06: 20 mg via ORAL
  Filled 2017-08-05: qty 1

## 2017-08-05 MED ORDER — ONDANSETRON HCL 4 MG/2ML IJ SOLN
INTRAMUSCULAR | Status: DC | PRN
  Administered 2017-08-05: 4 mg via INTRAVENOUS

## 2017-08-05 MED ORDER — PROPOFOL 10 MG/ML IV BOLUS
INTRAVENOUS | Status: DC | PRN
  Administered 2017-08-05: 200 mg via INTRAVENOUS

## 2017-08-05 MED ORDER — VANCOMYCIN HCL IN DEXTROSE 1-5 GM/200ML-% IV SOLN
1000.0000 mg | INTRAVENOUS | Status: AC
  Administered 2017-08-05: 1000 mg via INTRAVENOUS
  Filled 2017-08-05: qty 200

## 2017-08-05 MED ORDER — DIPHENHYDRAMINE HCL 12.5 MG/5ML PO ELIX: 12.5000 mg | ORAL_SOLUTION | Freq: Four times a day (QID) | ORAL | Status: DC | PRN

## 2017-08-05 MED ORDER — FUROSEMIDE 40 MG PO TABS
80.0000 mg | ORAL_TABLET | Freq: Two times a day (BID) | ORAL | Status: DC
  Administered 2017-08-05 – 2017-08-06 (×2): 80 mg via ORAL
  Filled 2017-08-05 (×3): qty 2

## 2017-08-05 MED ORDER — MEPERIDINE HCL 50 MG/ML IJ SOLN: 6.2500 mg | INTRAMUSCULAR | Status: DC | PRN

## 2017-08-05 MED ORDER — ROCURONIUM BROMIDE 50 MG/5ML IV SOSY
PREFILLED_SYRINGE | INTRAVENOUS | Status: DC | PRN
  Administered 2017-08-05 (×3): 20 mg via INTRAVENOUS
  Administered 2017-08-05: 50 mg via INTRAVENOUS

## 2017-08-05 MED ORDER — SUGAMMADEX SODIUM 500 MG/5ML IV SOLN
INTRAVENOUS | Status: DC | PRN
  Administered 2017-08-05: 300 mg via INTRAVENOUS

## 2017-08-05 MED ORDER — PHENYLEPHRINE 40 MCG/ML (10ML) SYRINGE FOR IV PUSH (FOR BLOOD PRESSURE SUPPORT)
PREFILLED_SYRINGE | INTRAVENOUS | Status: DC | PRN
  Administered 2017-08-05 (×3): 80 ug via INTRAVENOUS
  Administered 2017-08-05: 40 ug via INTRAVENOUS
  Administered 2017-08-05: 80 ug via INTRAVENOUS

## 2017-08-05 MED ORDER — POTASSIUM CHLORIDE CRYS ER 20 MEQ PO TBCR
40.0000 meq | EXTENDED_RELEASE_TABLET | Freq: Two times a day (BID) | ORAL | Status: DC
  Administered 2017-08-05 – 2017-08-06 (×2): 40 meq via ORAL
  Filled 2017-08-05 (×2): qty 2

## 2017-08-05 MED ORDER — IOHEXOL 300 MG/ML  SOLN
30.0000 mL | Freq: Once | INTRAMUSCULAR | Status: AC | PRN
  Administered 2017-08-05: 30 mL

## 2017-08-05 MED ORDER — LACTATED RINGERS IV SOLN
INTRAVENOUS | Status: DC
  Administered 2017-08-05 (×3): via INTRAVENOUS

## 2017-08-05 MED ORDER — INSULIN ASPART 100 UNIT/ML ~~LOC~~ SOLN
SUBCUTANEOUS | Status: AC
  Administered 2017-08-05: 3 [IU] via SUBCUTANEOUS
  Filled 2017-08-05: qty 1

## 2017-08-05 MED ORDER — FENTANYL 40 MCG/ML IV SOLN
INTRAVENOUS | Status: DC
  Administered 2017-08-05: 1000 ug via INTRAVENOUS
  Administered 2017-08-05: 15 ug via INTRAVENOUS
  Administered 2017-08-06: 45 ug via INTRAVENOUS
  Administered 2017-08-06: 15 ug via INTRAVENOUS
  Administered 2017-08-06: 60 ug via INTRAVENOUS
  Administered 2017-08-06: 15 ug via INTRAVENOUS
  Filled 2017-08-05: qty 25

## 2017-08-05 MED ORDER — HYDROMORPHONE HCL 1 MG/ML IJ SOLN: 0.2500 mg | INTRAMUSCULAR | Status: DC | PRN

## 2017-08-05 MED ORDER — SODIUM CHLORIDE 0.9% FLUSH: 9.0000 mL | INTRAVENOUS | Status: DC | PRN

## 2017-08-05 MED ORDER — LISINOPRIL 10 MG PO TABS
10.0000 mg | ORAL_TABLET | Freq: Every day | ORAL | Status: DC
  Administered 2017-08-06: 10 mg via ORAL
  Filled 2017-08-05: qty 1

## 2017-08-05 MED ORDER — MIDAZOLAM HCL 2 MG/2ML IJ SOLN
INTRAMUSCULAR | Status: AC
  Filled 2017-08-05: qty 2

## 2017-08-05 NOTE — Anesthesia Preprocedure Evaluation (Signed)
Anesthesia Evaluation  Patient identified by MRN, date of birth, ID band Patient awake    Reviewed: Allergy & Precautions, NPO status , Patient's Chart, lab work & pertinent test results  History of Anesthesia Complications (+) PONV  Airway Mallampati: I  TM Distance: >3 FB Neck ROM: Full    Dental   Pulmonary asthma ,    Pulmonary exam normal        Cardiovascular hypertension, Pt. on medications Normal cardiovascular exam     Neuro/Psych    GI/Hepatic   Endo/Other  diabetes  Renal/GU      Musculoskeletal   Abdominal   Peds  Hematology   Anesthesia Other Findings   Reproductive/Obstetrics                             Anesthesia Physical Anesthesia Plan  ASA: II  Anesthesia Plan: General   Post-op Pain Management:    Induction: Intravenous  PONV Risk Score and Plan: 4 or greater and Ondansetron, Dexamethasone and Midazolam  Airway Management Planned: Oral ETT  Additional Equipment:   Intra-op Plan:   Post-operative Plan: Extubation in OR  Informed Consent: I have reviewed the patients History and Physical, chart, labs and discussed the procedure including the risks, benefits and alternatives for the proposed anesthesia with the patient or authorized representative who has indicated his/her understanding and acceptance.     Plan Discussed with: CRNA and Surgeon  Anesthesia Plan Comments:         Anesthesia Quick Evaluation

## 2017-08-05 NOTE — H&P (Signed)
Referring Physician(s): Bell,E  Supervising Physician: Sandi Mariscal  Patient Status:  [facility] OP TBA  Chief Complaint:  Right renal lesions  Subjective: Patient familiar to IR service from consultation with Dr. Pascal Lux on 01/12/2017 to discuss treatment options for right renal lesions.  She has an enlarging 2 cm  right superior pole renal lesion as well as a stable 1.1 cm right interpolar lesion suspicious for renal cell carcinomas.  Following discussions with Dr. Pascal Lux she was deemed an appropriate candidate for CT-guided cryoablation /possible biopsy of the above-noted right renal lesions and presents today for the procedure.  She currently denies fever, headache, chest pain, dyspnea, cough, back pain,  vomiting, hematuria, dysuria.  She does have some intermittent mild right upper/right lateral abdominal discomfort as well as intermittent nausea.  Additional history as below. Past Medical History:  Diagnosis Date  . Anemia    hx of   . 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 Asthma    due to allergies   . Cancer ([place])   . Complication of anesthesia   . Diabetes mellitus   . Dyspnea    increased exertion   . Dysrhythmia    " irregular heart rate per patient"  . Family history of adverse reaction to anesthesia    difficult for father to wake after surgery  . Heart murmur   . Hypercholesteremia   . Hypertension   . PONV (postoperative nausea and vomiting)   . Thyroid disease   . TIA (transient ischemic attack)    Past Surgical History:  Procedure Laterality Date  . ABDOMINAL HYSTERECTOMY    . CHOLECYSTECTOMY    . COLON SURGERY    . IR RADIOLOGIST EVAL & MGMT  01/12/2017  . IR RADIOLOGIST EVAL & MGMT  06/16/2017  . KNEE ARTHROSCOPY    . LEFT HEART CATHETERIZATION WITH CORONARY ANGIOGRAM N/A 04/24/2014   Procedure: LEFT HEART CATHETERIZATION WITH CORONARY ANGIOGRAM;  Surgeon: Burnell Blanks, MD;  Location: [facility] CATH LAB;  Service: Cardiovascular;  Laterality: N/A;  .  THYROIDECTOMY N/A 09/20/2014   Procedure: TOTAL THYROIDECTOMY;  Surgeon: Armandina Gemma, MD;  Location: [facility] ORS;  Service: General;  Laterality: N/A;  . TONSILLECTOMY       Allergies: Lorabid [loracarbef]; Augmentin [amoxicillin-pot clavulanate]; Codeine; Gadolinium derivatives; Sulfa drugs cross reactors; Benicar [olmesartan medoxomil]; and Other  Medications: Prior to Admission medications   Medication Sig Start Date End Date Taking? Authorizing Provider  acetaminophen (TYLENOL) 500 MG tablet Take 2 tablets (1,000 mg total) by mouth every 6 (six) hours as needed (body aches). 06/29/17  Yes Eulogio Bear U, DO  b complex vitamins tablet Take 1 tablet by mouth daily.   Yes [provider]  calcium-vitamin D (OSCAL WITH D) 500-200 MG-UNIT tablet Take 2 tablets by mouth 3 (three) times daily. 05/17/16  Yes Geradine Girt, DO  clopidogrel (PLAVIX) 75 MG tablet Take 1 tablet (75 mg total) by mouth daily. Patient taking differently: Take 75 mg by mouth daily.  09/15/15  Yes Florencia Reasons, MD  diphenhydrAMINE (BENADRYL) 25 MG tablet Take 25-50 mg by mouth at bedtime.    Yes [provider]  furosemide (LASIX) 40 MG tablet Take 2 tablets (80 mg total) by mouth 2 (two) times daily. 05/17/16  Yes Eulogio Bear U, DO  lansoprazole (PREVACID) 15 MG capsule Take 1 capsule (15 mg total) by mouth daily at 12 noon. 03/07/15  Yes Little, Wenda Overland, MD  levothyroxine (SYNTHROID, LEVOTHROID) 300 MCG  tablet Take 300 mcg by mouth daily before breakfast.   Yes [provider]  lisinopril (PRINIVIL,ZESTRIL) 10 MG tablet Take 10 mg by mouth daily.  09/09/14  Yes [provider]  metFORMIN (GLUCOPHAGE) 1000 MG tablet Take 1 tablet (1,000 mg total) by mouth 2 (two) times daily with a meal. 04/29/14  Yes Mikhail, [place], DO  metoprolol tartrate (LOPRESSOR) 100 MG tablet Take 100 mg by mouth 2 (two) times daily. 06/23/16  Yes [provider]  Multiple Vitamins-Minerals (MULTIVITAMIN  GUMMIES WOMENS) CHEW Chew 2 each by mouth daily.   Yes [provider]  Omega-3 Fatty Acids (FISH OIL PO) Take 1 capsule by mouth daily.   Yes [provider]  ondansetron (ZOFRAN-ODT) 8 MG disintegrating tablet Take 8 mg by mouth 3 (three) times daily as needed for nausea or vomiting.   Yes [provider]  potassium chloride SA (K-DUR,KLOR-CON) 20 MEQ tablet Take 40 mEq by mouth 2 (two) times daily.    Yes [provider]  pravastatin (PRAVACHOL) 20 MG tablet Take 20 mg by mouth daily.  06/25/13  Yes [provider]  Probiotic Product (PROBIOTIC PO) Take 1 tablet by mouth daily.   Yes [provider]  sitaGLIPtin (JANUVIA) 100 MG tablet Take 100 mg by mouth daily.   Yes [provider]  albuterol (PROVENTIL HFA;VENTOLIN HFA) 108 (90 BASE) MCG/ACT inhaler Inhale 2 puffs into the lungs every 4 (four) hours as needed for wheezing or shortness of breath. 09/07/13   Leeanne Rio, MD     Vital Signs: Blood pressure 124/92, temp 98.9, heart rate 89, respirations 18, O2 sat 99% room air    Physical Exam awake, alert.  Chest clear to auscultation bilaterally.  Heart with regular rate and rhythm.  Abdomen obese, soft, positive bowel sounds, nontender.   Extremities with full range of motion.  Imaging: No results found.  Labs:  CBC: Recent Labs    06/26/17 1911 06/27/17 0327 08/02/17 1356 08/05/17 1031  WBC 8.2 7.2 6.8 8.4  HGB 12.7 11.3* 13.0 13.7  HCT 39.5 34.4* 40.0 41.3  PLT 258 230 284 294    COAGS: Recent Labs    08/05/17 1031  INR 0.99    BMP: Recent Labs    06/27/17 0327 06/28/17 0756 08/02/17 1356 08/05/17 1031  NA 139 138 140 140  K 3.0* 3.9 3.9 3.6  CL 104 105 103 104  CO2 25 22 25 22   GLUCOSE 233* 214* 213* 228*  BUN 16 11 21* 17  CALCIUM 8.1* 8.5* 9.0 8.8*  CREATININE 1.21* 1.05* 1.10* 1.01*  GFRNONAA 49* 59* 55* >60  GFRAA 57* >60 >60 >60    LIVER FUNCTION TESTS: Recent Labs     01/21/17 0419 06/26/17 1911  BILITOT 0.7 0.5  AST 17 22  ALT 21 20  ALKPHOS 70 62  PROT 6.7 6.6  ALBUMIN 3.6 3.6    Assessment and Plan: Pt with hx of an enlarging 2 cm right superior pole renal lesion as well as a stable 1.1 cm right interpolar lesion suspicious for renal cell carcinomas.  Following consultation with Dr. Pascal Lux on 01/12/17 and subsequently on 06/16/17 she was deemed an appropriate candidate for CT-guided cryoablation /possible biopsy of the above-noted right renal lesions and presents today for the procedure. Risks and benefits discussed with the patient/family including, but not limited to bleeding, infection, damage to adjacent structures or anesthesia related complications.   All of the patient's questions were answered, patient is agreeable  to proceed. Consent signed and in chart. This procedure involves the use of X-rays and because of the nature of the planned procedure, it is possible that we will have prolonged use of CT.  Potential radiation risks to you include (but are not limited to) the following: - A slightly elevated risk for cancer  several years later in life. This risk is typically less than 0.5% percent. This risk is low in comparison to the normal incidence of human cancer, which is 33% for women and 50% for men according to the [employer]. - Radiation induced injury can include skin redness, resembling a rash, tissue breakdown / ulcers and hair loss (which can be temporary or permanent).   The likelihood of either of these occurring depends on the difficulty of the procedure and whether you are sensitive to radiation due to previous procedures, disease, or genetic conditions.   IF your procedure requires a prolonged use of radiation, you will be notified and given written instructions for further action.  It is your responsibility to monitor the irradiated area for the 2 weeks following the procedure and to notify your physician if you are  concerned that you have suffered a radiation induced injury.    Postprocedure pt will be admitted to hospital for overnight observation   Electronically Signed: D. Rowe Robert, PA-C 08/05/2017, 11:03 AM   I spent a total of 30 minutes at the the patient's bedside AND on the patient's hospital floor or unit, greater than 50% of which was counseling/coordinating care for CT guided cryoablation/possible biopsy of right renal lesions

## 2017-08-05 NOTE — Procedures (Signed)
Pre procedural Dx: Right sided renal lesion  Post procedural Dx: Same  Technically successful CT guided cryoablations of both upper and mid pole right sided renal lesions   EBL: Trace  Complications: None immediate.   Ronny Bacon, MD Pager #: [phone] '

## 2017-08-05 NOTE — Transfer of Care (Signed)
Immediate Anesthesia Transfer of Care Note  Patient: [name]  Procedure(s) Performed: CT RENAL CRYO AND BIOPSY (Right )  Patient Location: PACU  Anesthesia Type:General  Level of Consciousness: drowsy and patient cooperative  Airway & Oxygen Therapy: Patient Spontanous Breathing and Patient connected to face mask oxygen  Post-op Assessment: Report given to RN and Post -op Vital signs reviewed and stable  Post vital signs: Reviewed and stable  Last Vitals:  Vitals Value Taken Time  BP 156/96 08/05/2017  4:03 PM  Temp    Pulse 82 08/05/2017  4:05 PM  Resp 14 08/05/2017  4:05 PM  SpO2 100 % 08/05/2017  4:05 PM  Vitals shown include unvalidated device data.  Last Pain:  Vitals:   08/05/17 1041  PainSc: 0-No pain         Complications: No apparent anesthesia complications

## 2017-08-05 NOTE — Anesthesia Procedure Notes (Signed)
Procedure Name: Intubation Date/Time: 08/05/2017 12:59 PM Performed by: Montel Clock, CRNA Pre-anesthesia Checklist: Patient identified, Emergency Drugs available, Suction available, Patient being monitored and Timeout performed Patient Re-evaluated:Patient Re-evaluated prior to induction Oxygen Delivery Method: Circle system utilized Preoxygenation: Pre-oxygenation with 100% oxygen Induction Type: IV induction Ventilation: Mask ventilation without difficulty and Oral airway inserted - appropriate to patient size Laryngoscope Size: Mac and 3 Grade View: Grade I Tube type: Oral Tube size: 7.5 mm Number of attempts: 1 Airway Equipment and Method: Stylet Placement Confirmation: ETT inserted through vocal cords under direct vision,  positive ETCO2 and breath sounds checked- equal and bilateral Secured at: 23 cm Tube secured with: Tape Dental Injury: Teeth and Oropharynx as per pre-operative assessment

## 2017-08-05 NOTE — Anesthesia Postprocedure Evaluation (Signed)
Anesthesia Post Note  Patient: [name]  Procedure(s) Performed: CT RENAL CRYO AND BIOPSY (Right )     Patient location during evaluation: PACU Anesthesia Type: General Level of consciousness: awake and alert Pain management: pain level controlled Vital Signs Assessment: post-procedure vital signs reviewed and stable Respiratory status: spontaneous breathing, nonlabored ventilation, respiratory function stable and patient connected to nasal cannula oxygen Cardiovascular status: blood pressure returned to baseline and stable Postop Assessment: no apparent nausea or vomiting Anesthetic complications: no    Last Vitals:  Vitals:   08/05/17 1616 08/05/17 1635  BP: (!) 142/81   Pulse: 87   Resp: 17   Temp:  37 C  SpO2: 100%     Last Pain:  Vitals:   08/05/17 1603  PainSc: 0-No pain                 [place] DAVID

## 2017-08-06 DIAGNOSIS — N289 Disorder of kidney and ureter, unspecified: Secondary | ICD-10-CM | POA: Diagnosis not present

## 2017-08-06 MED ORDER — INSULIN ASPART 100 UNIT/ML ~~LOC~~ SOLN
0.0000 [IU] | Freq: Three times a day (TID) | SUBCUTANEOUS | Status: DC
  Administered 2017-08-06: 3 [IU] via SUBCUTANEOUS
  Administered 2017-08-06: 7 [IU] via SUBCUTANEOUS
  Administered 2017-08-06: 3 [IU] via SUBCUTANEOUS

## 2017-08-06 NOTE — Discharge Summary (Signed)
Patient ID: [name] MRN: [id number] DOB/AGE: 03-Apr-1960 57 y.o.  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 She has done well overnight. Denies pain; denies N/V Eating well Passing gas; urinating well-- clear yellow Sites are clean and dry Discussed with Dr Pascal Lux Plan for discharge to home.   Consults: None  Significant Diagnostic Studies: Abdominal CT  Treatments:  Pre procedural Dx: Right sided renal lesion Post procedural Dx: Same Technically successful CT guided cryoablations of both upper and mid pole right sided renal lesions    Discharge Exam: Blood pressure (!) 145/67, pulse 69, temperature 99.2 F (37.3 C), temperature source Oral, resp. rate 16, SpO2 98 %.  A/O Appropriate [place] [facility] RRR Lungs CTA Abd soft; +BS; NT Back; skin sites of procedure: Clean and dry NT no bleeding No hematoma UOP great: clear yellow Extr: FROM  Results for orders placed or performed during the hospital encounter of 08/05/17  Glucose, capillary  Result Value Ref Range   Glucose-Capillary 197 (H) 65 - 99 mg/dL   Comment 1 Notify RN    Comment 2 Document in Chart   Basic metabolic panel  Result Value Ref Range   Sodium 140 135 - 145 mmol/L   Potassium 3.6 3.5 - 5.1 mmol/L   Chloride 104 101 - 111 mmol/L   CO2 22 22 - 32 mmol/L   Glucose, Bld 228 (H) 65 - 99 mg/dL   BUN 17 6 - 20 mg/dL   Creatinine, Ser 1.01 (H) 0.44 - 1.00 mg/dL   Calcium 8.8 (L) 8.9 - 10.3 mg/dL   GFR calc non Af Amer >60 >60 mL/min   GFR calc Af Amer >60 >60 mL/min   Anion gap 14 5 - 15  CBC with Differential/Platelet  Result  Value Ref Range   WBC 8.4 4.0 - 10.5 K/uL   RBC 4.61 3.87 - 5.11 MIL/uL   Hemoglobin 13.7 12.0 - 15.0 g/dL   HCT 41.3 36.0 - 46.0 %   MCV 89.6 78.0 - 100.0 fL   MCH 29.7 26.0 - 34.0 pg   MCHC 33.2 30.0 - 36.0 g/dL   RDW 13.7 11.5 - 15.5 %   Platelets 294 150 - 400 K/uL   Neutrophils Relative % 68 %   Neutro Abs 5.7 1.7 - 7.7 K/uL   Lymphocytes Relative 24 %   Lymphs Abs 2.0 0.7 - 4.0 K/uL   Monocytes Relative 6 %   Monocytes Absolute 0.5 0.1 - 1.0 K/uL   Eosinophils Relative 2 %   Eosinophils Absolute 0.1 0.0 - 0.7 K/uL   Basophils Relative 0 %   Basophils Absolute 0.0 0.0 - 0.1 K/uL  Protime-INR  Result Value Ref Range   Prothrombin Time 13.0 11.4 - 15.2 seconds   INR 0.99   Glucose, capillary  Result Value Ref Range   Glucose-Capillary 321 (H) 65 - 99 mg/dL   Comment 1 Notify RN   Type and screen  Result Value Ref Range   ABO/RH(D) A POS    Antibody Screen NEG    Sample Expiration      08/08/2017 Performed at [facility], [place] [address], [place], [place] [address]   ABO/Rh  Result Value Ref Range   ABO/RH(D)      A POS Performed at [facility], [place] [address], [place], [place] [address]    Disposition:  Rt renal masses: cryoablation Performed in IR 5/10- Dr Pascal Lux For DC to home now She has good understanding of DC instructions Resume all meds Follow up with Dr Pascal Lux--- she will hear from scheduler for time and date  Discharge Instructions    Call MD for:  difficulty breathing, headache or visual disturbances   Complete by:  As directed    Call MD for:  extreme fatigue   Complete by:  As directed    Call MD for:  hives   Complete by:  As directed    Call MD for:  persistant dizziness or light-headedness   Complete by:  As directed    Call MD for:  persistant nausea and vomiting   Complete by:  As directed    Call MD for:  redness, tenderness, or signs of infection (pain, swelling, redness, odor or green/yellow  discharge around incision site)   Complete by:  As directed    Call MD for:  severe uncontrolled pain   Complete by:  As directed    Call MD for:  temperature >100.4   Complete by:  As directed    Diet - low sodium heart healthy   Complete by:  As directed    Discharge instructions   Complete by:  As directed    Dc to home; resume all home meds; restful over weekend; scheduler will call pt with time and date for follow up; call [phone] if questions   Discharge wound care:   Complete by:  As directed    May shower today- keep clean band aids on sites daily x 1 week   Driving Restrictions   Complete by:  As directed    No driving x 3 days   Increase activity slowly   Complete by:  As directed    Lifting restrictions   Complete by:  As directed    No lifting over 10 lbs x 3 days     Allergies as of 08/06/2017      Reactions   Lorabid [loracarbef] Anaphylaxis   Augmentin [amoxicillin-pot Clavulanate] Hives, Nausea And Vomiting, Other (See Comments)   Chest pain   Codeine Nausea And Vomiting   Gadolinium Derivatives Itching, Swelling   Patient will require 13 hr prep prior to administration of gadolinium     Sulfa Drugs Cross Reactors Other (See Comments)   Severe allergic reaction as a child - was not told symptoms   Benicar [olmesartan Medoxomil] Swelling, Rash   Other Rash   Green peas      Medication List    TAKE these medications   acetaminophen 500 MG tablet Commonly known as:  TYLENOL Take 2 tablets (1,000 mg total) by mouth every 6 (six) hours as needed (body aches).   albuterol 108 (90 Base) MCG/ACT inhaler Commonly known as:  PROVENTIL HFA;VENTOLIN HFA Inhale 2 puffs into the lungs every 4 (four) hours as needed for wheezing or shortness of breath.   b complex vitamins tablet Take 1 tablet by mouth daily.   calcium-vitamin D 500-200 MG-UNIT tablet Commonly known as:  OSCAL WITH D Take 2 tablets by mouth 3 (three) times daily.   clopidogrel 75 MG  tablet Commonly known as:  PLAVIX Take 1 tablet (75 mg total) by mouth daily.   diphenhydrAMINE 25 MG tablet Commonly known as:  BENADRYL Take 25-50 mg by mouth at bedtime.   FISH OIL PO Take 1 capsule by mouth daily.   furosemide 40 MG tablet Commonly known as:  LASIX Take 2 tablets (80 mg total) by mouth 2 (two) times daily.   lansoprazole 15 MG capsule Commonly known as:  PREVACID Take 1 capsule (15 mg total) by mouth daily at 12 noon.   levothyroxine 300 MCG tablet Commonly known as:  SYNTHROID, LEVOTHROID Take 300 mcg by mouth daily before breakfast.   lisinopril 10 MG tablet Commonly known as:  PRINIVIL,ZESTRIL Take 10 mg by mouth daily.   metFORMIN 1000 MG tablet Commonly known as:  GLUCOPHAGE Take 1 tablet (1,000 mg total) by mouth 2 (two) times daily with a meal.   metoprolol tartrate 100 MG tablet Commonly known as:  LOPRESSOR Take 100 mg by mouth 2 (two) times daily.   MULTIVITAMIN GUMMIES WOMENS Diona Fanti 2 each by mouth daily.   ondansetron 8 MG disintegrating tablet Commonly known as:  ZOFRAN-ODT Take 8 mg by mouth 3 (three) times daily as needed for nausea or vomiting.   potassium chloride SA 20 MEQ tablet Commonly known as:  K-DUR,KLOR-CON Take 40 mEq by mouth 2 (two) times daily.   pravastatin 20 MG tablet Commonly known as:  PRAVACHOL Take 20 mg by mouth daily.   PROBIOTIC PO Take 1 tablet by mouth daily.   sitaGLIPtin 100 MG tablet Commonly known as:  JANUVIA Take 100 mg by mouth daily.            Discharge Care Instructions  (From admission, onward)        Start     Ordered   08/06/17 0000  Discharge wound care:    Comments:  May shower today- keep clean band aids on sites daily x 1 week   08/06/17 1131     Follow-up Information    Sandi Mariscal, MD Follow up in 4 week(s).   Specialties:  Interventional Radiology, Radiology Why:  pt will hear from scheduler for follow up time and date; call [phone] if  questions Contact information: [address] [place] [address] [phone]            Electronically Signed: Monia Sabal A, PA-C 08/06/2017, 11:33 AM   I have spent Greater Than 30 Minutes discharging [employer].

## 2017-08-08 ENCOUNTER — Encounter (HOSPITAL_COMMUNITY): Payer: Self-pay | Admitting: Interventional Radiology

## 2017-08-08 LAB — GLUCOSE, CAPILLARY
GLUCOSE-CAPILLARY: 213 mg/dL — AB (ref 65–99)
Glucose-Capillary: 201 mg/dL — ABNORMAL HIGH (ref 65–99)

## 2017-08-09 ENCOUNTER — Other Ambulatory Visit (HOSPITAL_COMMUNITY): Payer: Self-pay | Admitting: Interventional Radiology

## 2017-08-09 DIAGNOSIS — N2889 Other specified disorders of kidney and ureter: Secondary | ICD-10-CM

## 2017-08-11 ENCOUNTER — Other Ambulatory Visit: Payer: Self-pay | Admitting: *Deleted

## 2017-08-11 DIAGNOSIS — N2889 Other specified disorders of kidney and ureter: Secondary | ICD-10-CM

## 2017-08-13 ENCOUNTER — Emergency Department (HOSPITAL_COMMUNITY): Payer: BC Managed Care – PPO

## 2017-08-13 ENCOUNTER — Encounter (HOSPITAL_COMMUNITY): Payer: Self-pay | Admitting: Pharmacy Technician

## 2017-08-13 ENCOUNTER — Emergency Department (HOSPITAL_COMMUNITY)
Admission: EM | Admit: 2017-08-13 | Discharge: 2017-08-14 | Disposition: A | Discharge status: Home / Self Care | Payer: BC Managed Care – PPO | Attending: Emergency Medicine | Admitting: Emergency Medicine

## 2017-08-13 ENCOUNTER — Other Ambulatory Visit: Payer: Self-pay

## 2017-08-13 DIAGNOSIS — E119 Type 2 diabetes mellitus without complications: Secondary | ICD-10-CM | POA: Diagnosis not present

## 2017-08-13 DIAGNOSIS — I2510 Atherosclerotic heart disease of native coronary artery without angina pectoris: Secondary | ICD-10-CM | POA: Insufficient documentation

## 2017-08-13 DIAGNOSIS — Z79899 Other long term (current) drug therapy: Secondary | ICD-10-CM | POA: Diagnosis not present

## 2017-08-13 DIAGNOSIS — I10 Essential (primary) hypertension: Secondary | ICD-10-CM | POA: Insufficient documentation

## 2017-08-13 DIAGNOSIS — Z7984 Long term (current) use of oral hypoglycemic drugs: Secondary | ICD-10-CM | POA: Diagnosis not present

## 2017-08-13 DIAGNOSIS — R072 Precordial pain: Secondary | ICD-10-CM | POA: Diagnosis not present

## 2017-08-13 DIAGNOSIS — E039 Hypothyroidism, unspecified: Secondary | ICD-10-CM | POA: Diagnosis not present

## 2017-08-13 DIAGNOSIS — R911 Solitary pulmonary nodule: Secondary | ICD-10-CM | POA: Insufficient documentation

## 2017-08-13 DIAGNOSIS — Z7901 Long term (current) use of anticoagulants: Secondary | ICD-10-CM | POA: Diagnosis not present

## 2017-08-13 DIAGNOSIS — R079 Chest pain, unspecified: Secondary | ICD-10-CM | POA: Diagnosis present

## 2017-08-13 LAB — BASIC METABOLIC PANEL
ANION GAP: 9 (ref 5–15)
BUN: 16 mg/dL (ref 6–20)
CALCIUM: 8.3 mg/dL — AB (ref 8.9–10.3)
CHLORIDE: 104 mmol/L (ref 101–111)
CO2: 23 mmol/L (ref 22–32)
Creatinine, Ser: 1.18 mg/dL — ABNORMAL HIGH (ref 0.44–1.00)
GFR calc Af Amer: 59 mL/min — ABNORMAL LOW (ref >60)
GFR calc non Af Amer: 51 mL/min — ABNORMAL LOW (ref >60)
GLUCOSE: 273 mg/dL — AB (ref 65–99)
Potassium: 3.7 mmol/L (ref 3.5–5.1)
Sodium: 136 mmol/L (ref 135–145)

## 2017-08-13 LAB — I-STAT TROPONIN, ED: TROPONIN I, POC: 0 ng/mL (ref 0.00–0.08)

## 2017-08-13 LAB — I-STAT BETA HCG BLOOD, ED (MC, WL, AP ONLY): I-stat hCG, quantitative: <5 m[IU]/mL (ref <5)

## 2017-08-13 LAB — URINALYSIS, ROUTINE W REFLEX MICROSCOPIC
Bilirubin Urine: NEGATIVE
Glucose, UA: NEGATIVE mg/dL
Hgb urine dipstick: NEGATIVE
KETONES UR: NEGATIVE mg/dL
LEUKOCYTES UA: NEGATIVE
NITRITE: NEGATIVE
PROTEIN: NEGATIVE mg/dL
Specific Gravity, Urine: 1.012 (ref 1.005–1.030)
pH: 5 (ref 5.0–8.0)

## 2017-08-13 LAB — CBC
HCT: 40.1 % (ref 36.0–46.0)
HEMOGLOBIN: 12.7 g/dL (ref 12.0–15.0)
MCH: 28.3 pg (ref 26.0–34.0)
MCHC: 31.7 g/dL (ref 30.0–36.0)
MCV: 89.3 fL (ref 78.0–100.0)
Platelets: 303 10*3/uL (ref 150–400)
RBC: 4.49 MIL/uL (ref 3.87–5.11)
RDW: 13.4 % (ref 11.5–15.5)
WBC: 6.6 10*3/uL (ref 4.0–10.5)

## 2017-08-13 MED ORDER — IOPAMIDOL (ISOVUE-370) INJECTION 76%
INTRAVENOUS | Status: AC
  Filled 2017-08-13: qty 100

## 2017-08-13 MED ORDER — IOPAMIDOL (ISOVUE-370) INJECTION 76%
100.0000 mL | Freq: Once | INTRAVENOUS | Status: AC | PRN
  Administered 2017-08-13: 100 mL via INTRAVENOUS

## 2017-08-13 MED ORDER — SODIUM CHLORIDE 0.9 % IV SOLN
INTRAVENOUS | Status: DC
Start: 2017-08-13 — End: 2017-08-14
  Administered 2017-08-13: 23:00:00 via INTRAVENOUS

## 2017-08-13 NOTE — ED Triage Notes (Signed)
CP onset Wednesday. Pt reports pressure that's constant with no radiation. Exertional SOB onset Thursday. EKG unremarkable with EMS. Recent surgery last week to remove CA. Pt also with recent UTI and finished ABX yesterday. 324mg  ASA and 2 SL nitro en route. 18G LAC. 113/68, HR 88, RR 18, 98% RA, CBG 250.

## 2017-08-13 NOTE — ED Notes (Signed)
Pt called the CT department complaining of bleeding, CP, shob since renal ablation on 08/05/17. Pt advised to come to ED, Dr Vernard Gambles Radiologist notified and agreed that pt should come to the ER. This note was entered by Leroy Libman on behalf of Leont[facility] [employer]

## 2017-08-14 NOTE — Discharge Instructions (Addendum)
Follow-up with your cardiologist in [place].  They may want to do a stress test.  CT did show evidence of pulmonary nodule that may require follow-up on an annual basis.  Mixture regular doctor is aware of that.  Return for any new or worse symptoms.  Today's work-up without evidence of any acute cardiac event no evidence of any blood clots in the lungs.

## 2017-08-14 NOTE — ED Provider Notes (Signed)
[facility] EMERGENCY DEPARTMENT Provider Note   CSN: [id number] Arrival date & time: 08/13/17  1922     History   Chief Complaint Chief Complaint  Patient presents with  . Chest Pain    HPI [name] is a 57 y.o. female.  Patient with a complaint of substernal,  constantly for the past several days.  Patient has a history of diabetes and asthma.  Symptoms have been associated with some shortness of breath.  Chest pain is nonradiating.  Patient denies any leg swelling.  Patient has not had pain like this before.     Past Medical History:  Diagnosis Date  . Anemia    hx of   . 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 Asthma    due to allergies   . Cancer ([place])   . Complication of anesthesia   . Diabetes mellitus   . Dyspnea    increased exertion   . Dysrhythmia    " irregular heart rate per patient"  . Family history of adverse reaction to anesthesia    difficult for father to wake after surgery  . Heart murmur   . Hypercholesteremia   . Hypertension   . PONV (postoperative nausea and vomiting)   . Thyroid disease   . TIA (transient ischemic attack)     Patient Active Problem List   Diagnosis Date Noted  . 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 UTI (urinary tract infection) 06/26/2017  . Urinary tract infection 01/24/2017  . Abdominal pain 01/21/2017  . Stroke-like symptoms 05/16/2016  . 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 Fatty liver disease, nonalcoholic 08/65/7846  . Slurred speech 09/13/2015  . Parotitis 09/13/2015  . Gastroenteritis 08/21/2015  . Hypothyroid 08/21/2015  . 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 Pain in the chest   . Essential hypertension   . Thoracic aortic aneurysm ([place])   . Chest pain 04/23/2014  . Anginal pain ([place]) 04/23/2014  . Type 2 diabetes mellitus ([place])   . Hypertension   . Hypercholesteremia     Past  Surgical History:  Procedure Laterality Date  . ABDOMINAL HYSTERECTOMY    . CHOLECYSTECTOMY    . COLON SURGERY    . IR RADIOLOGIST EVAL & MGMT  01/12/2017  . IR RADIOLOGIST EVAL & MGMT  06/16/2017  . KNEE ARTHROSCOPY    . LEFT HEART CATHETERIZATION WITH CORONARY ANGIOGRAM N/A 04/24/2014   Procedure: LEFT HEART CATHETERIZATION WITH CORONARY ANGIOGRAM;  Surgeon: Burnell Blanks, MD;  Location[facility] C[facility] LAB;  Service: Cardiovascular;  Laterality: N/A;  . RADIOFREQUENCY ABLATION Right 08/05/2017   Procedure: CT RENAL CRYO AND BIOPSY;  Surgeon: Sandi Mariscal, MD;  Location: [facility] ORS;  Service: Anesthesiology;  Laterality: Right;  . THYROIDECTOMY N/A 09/20/2014   Procedure: TOTAL THYROIDECTOMY;  Surgeon: Armandina Gemma, MD;  Location: [facility] ORS;  Service: General;  Laterality: N/A;  . TONSILLECTOMY       OB History   None      Home Medications    Prior to Admission medications   Medication Sig Start Date End Date Taking? Authorizing Provider  acetaminophen (TYLENOL) 500 MG tablet Take 2 tablets (1,000 mg total) by mouth every 6 (six) hours as needed (body aches). 06/29/17  Yes Vann, Jessica U, DO  albuterol (PROVENTIL HFA;VENTOLIN HFA) 108 (90 BASE) MCG/ACT inhaler Inhale 2 puffs into the lungs every 4 (four) hours as needed for wheezing or  shortness of breath. 09/07/13  Yes Leeanne Rio, MD  b complex vitamins tablet Take 1 tablet by mouth daily.   Yes [provider]  calcium-vitamin D (OSCAL WITH D) 500-200 MG-UNIT tablet Take 2 tablets by mouth 3 (three) times daily. 05/17/16  Yes Geradine Girt, DO  clopidogrel (PLAVIX) 75 MG tablet Take 1 tablet (75 mg total) by mouth daily. 09/15/15  Yes Florencia Reasons, MD  diphenhydrAMINE (BENADRYL) 25 MG tablet Take 25-50 mg by mouth at bedtime.    Yes [provider]  furosemide (LASIX) 40 MG tablet Take 2 tablets (80 mg total) by mouth 2 (two) times daily. 05/17/16  Yes Eulogio Bear U, DO  lansoprazole (PREVACID) 15 MG capsule Take 1  capsule (15 mg total) by mouth daily at 12 noon. 03/07/15  Yes Little, Wenda [place], MD  levothyroxine (SYNTHROID, LEVOTHROID) 300 MCG tablet Take 300 mcg by mouth daily before breakfast.   Yes [provider]  lisinopril (PRINIVIL,ZESTRIL) 10 MG tablet Take 10 mg by mouth daily.  09/09/14  Yes [provider]  metFORMIN (GLUCOPHAGE) 1000 MG tablet Take 1 tablet (1,000 mg total) by mouth 2 (two) times daily with a meal. 04/29/14  Yes Mikhail, [place], DO  metoprolol tartrate (LOPRESSOR) 100 MG tablet Take 100 mg by mouth 2 (two) times daily. 06/23/16  Yes [provider]  Multiple Vitamins-Minerals (MULTIVITAMIN GUMMIES WOMENS) CHEW Chew 2 each by mouth daily.   Yes [provider]  Omega-3 Fatty Acids (FISH OIL PO) Take 1 capsule by mouth daily.   Yes [provider]  ondansetron (ZOFRAN-ODT) 8 MG disintegrating tablet Take 8 mg by mouth 3 (three) times daily as needed for nausea or vomiting.   Yes [provider]  potassium chloride SA (K-DUR,KLOR-CON) 20 MEQ tablet Take 40 mEq by mouth 2 (two) times daily.    Yes [provider]  pravastatin (PRAVACHOL) 20 MG tablet Take 20 mg by mouth daily.  06/25/13  Yes [provider]  Probiotic Product (PROBIOTIC PO) Take 1 tablet by mouth daily.   Yes [provider]  sitaGLIPtin (JANUVIA) 100 MG tablet Take 100 mg by mouth daily.   Yes [provider]    Family History Family History  Problem Relation Age of Onset  . Heart failure Mother   . Diabetes Mother   . Stroke Father   . Heart failure Father   . Cancer Father     Social History Social History   Tobacco Use  . Smoking status: Never Smoker  . Smokeless tobacco: Never Used  Substance Use Topics  . Alcohol use: No  . Drug use: No     Allergies   Lorabid [loracarbef]; Augmentin [amoxicillin-pot clavulanate]; Codeine; Gadolinium derivatives; Sulfa drugs cross reactors; Benicar [olmesartan medoxomil];  and Other   Review of Systems Review of Systems  Constitutional: Negative for fever.  HENT: Negative for congestion.   Eyes: Negative for redness.  Respiratory: Positive for shortness of breath.   Cardiovascular: Positive for chest pain.  Gastrointestinal: Negative for abdominal pain.  Genitourinary: Negative for dysuria.  Musculoskeletal: Negative for myalgias.  Neurological: Negative for syncope and headaches.  Hematological: Does not bruise/bleed easily.  Psychiatric/Behavioral: Negative for confusion.     Physical Exam Updated Vital Signs BP 117/62   Pulse 70   Temp 98.3 F (36.8 C) (Oral)   Resp (!) 22   Ht 1.676 m (5\' 6" )   Wt 111.6 kg (246 lb)   SpO2 97%   BMI  39.71 kg/m   Physical Exam  Constitutional: She is oriented to person, place, and time. She appears well-developed and well-nourished. No distress.  HENT:  Head: Normocephalic and atraumatic.  Mouth/Throat: Oropharynx is clear and moist.  Eyes: Pupils are equal, round, and reactive to light. Conjunctivae and EOM are normal.  Neck: Neck supple.  Cardiovascular: Normal rate, regular rhythm and normal heart sounds.  Pulmonary/Chest: Effort normal and breath sounds normal. No respiratory distress. She has no wheezes.  Abdominal: Soft. Bowel sounds are normal. There is no tenderness.  Musculoskeletal: Normal range of motion.  Neurological: She is alert and oriented to person, place, and time. No cranial nerve deficit or sensory deficit. She exhibits normal muscle tone. Coordination normal.  Skin: Skin is warm.  Nursing note and vitals reviewed.    ED Treatments / Results  Labs (all labs ordered are listed, but only abnormal results are displayed) Labs Reviewed  BASIC METABOLIC PANEL - Abnormal; Notable for the following components:      Result Value   Glucose, Bld 273 (*)    Creatinine, Ser 1.18 (*)    Calcium 8.3 (*)    GFR calc non Af Amer 51 (*)    GFR calc Af Amer 59 (*)    All other components  within normal limits  CBC  URINALYSIS, ROUTINE W REFLEX MICROSCOPIC  I-STAT TROPONIN, ED  I-STAT BETA HCG BLOOD, ED ([facility], [facility], [facility] ONLY)    EKG EKG Interpretation  Date/Time:  Saturday Aug 13 2017 19:31:40 EDT Ventricular Rate:  82 PR Interval:    QRS Duration: 86 QT Interval:  359 QTC Calculation: 420 R Axis:   -45 Text Interpretation:  Sinus rhythm Left anterior fascicular block Probable anteroseptal infarct, old Confirmed by Fredia Sorrow [phone]) on 08/13/2017 7:40:34 PM   Radiology Dg Chest 2 View  Result Date: 08/13/2017 CLINICAL DATA:  Chest pain onset Wednesday with exertional dyspnea on Thursday. EXAM: CHEST - 2 VIEW COMPARISON:  CXR 09/13/2015 FINDINGS: The heart size and mediastinal contours are within normal limits. Mild uncoiling of the thoracic aorta. Both lungs are clear. The visualized skeletal structures are unremarkable. IMPRESSION: No active cardiopulmonary disease. Electronically Signed   By: Ashley Royalty M.D.   On: 08/13/2017 19:54   Ct Angio Chest Pe W/cm &/or Wo Cm  Result Date: 08/13/2017 CLINICAL DATA:  Acute onset of generalized chest pressure and shortness of breath. EXAM: CT ANGIOGRAPHY CHEST WITH CONTRAST TECHNIQUE: Multidetector CT imaging of the chest was performed using the standard protocol during bolus administration of intravenous contrast. Multiplanar CT image reconstructions and MIPs were obtained to evaluate the vascular anatomy. CONTRAST:  159mL ISOVUE-370 IOPAMIDOL (ISOVUE-370) INJECTION 76% COMPARISON:  CTA of the chest performed 04/25/2016 FINDINGS: Cardiovascular:  There is no evidence of pulmonary embolus. The heart is normal in size. Diffuse coronary artery calcifications are seen. The thoracic aorta is grossly unremarkable. The great vessels are within normal limits. Mediastinum/Nodes: The patient is status post thyroidectomy. The mediastinum is otherwise unremarkable. No mediastinal lymphadenopathy is seen. No pericardial effusion is identified.  No axillary lymphadenopathy is seen. Lungs/Pleura: A 5 mm nodule is noted at the left lung base (image 54 of 84). The lungs are otherwise clear. No pleural effusion or pneumothorax is seen. Upper Abdomen: The visualized portions of the liver and spleen are unremarkable. The patient is status post cholecystectomy, with clips noted at the gallbladder fossa. The visualized portions of the pancreas and adrenal glands are within normal limits. Musculoskeletal: No acute osseous abnormalities are  identified. The visualized musculature is unremarkable in appearance. Review of the MIP images confirms the above findings. IMPRESSION: 1. No evidence of pulmonary embolus. 2. 5 mm nodule at the left lung base. No follow-up needed if patient is low-risk. Non-contrast chest CT can be considered in 12 months if patient is high-risk. This recommendation follows the consensus statement: Guidelines for Management of Incidental Pulmonary Nodules Detected on CT Images: From the Fleischner Society 2017; Radiology 2017; 284:228-243. 3. Diffuse coronary artery calcifications seen. Electronically Signed   By: Garald Balding M.D.   On: 08/13/2017 22:55    Procedures Procedures (including critical care time)  Medications Ordered in ED Medications  0.9 %  sodium chloride infusion ( Intravenous New Bag/Given 08/13/17 2243)  iopamidol (ISOVUE-370) 76 % injection (has no administration in time range)  iopamidol (ISOVUE-370) 76 % injection 100 mL (100 mLs Intravenous Contrast Given 08/13/17 2224)     Initial Impression / Assessment and Plan / ED Course  I have reviewed the triage vital signs and the nursing notes.  Pertinent labs & imaging results that were available during my care of the patient were reviewed by me and considered in my medical decision making (see chart for details).     Patient's work-up here troponin negative chest x-ray negative CT angios negative for pulmonary embolus did identify a pulmonary nodule.   Patient's had several day history of chest pain.  Patient stable for follow-up with her cardiologist in [place].  She will return for any new or worse symptoms.  Patient understands importance of follow-up for the pulmonary nodule.  EKG without any acute cardiac changes.  Final Clinical Impressions(s) / ED Diagnoses   Final diagnoses:  Precordial pain  Pulmonary nodule    ED Discharge Orders    None       Fredia Sorrow, MD 08/14/17 [phone]

## 2017-08-29 LAB — BASIC METABOLIC PANEL WITH GFR
BUN / CREAT RATIO: 21 (calc) (ref 6–22)
BUN: 23 mg/dL (ref 7–25)
CHLORIDE: 105 mmol/L (ref 98–110)
CO2: 25 mmol/L (ref 20–32)
Calcium: 9.1 mg/dL (ref 8.6–10.4)
Creat: 1.12 mg/dL — ABNORMAL HIGH (ref 0.50–1.05)
GFR, Est African American: 64 mL/min/{1.73_m2} (ref >=60)
GFR, Est Non African American: 55 mL/min/{1.73_m2} — ABNORMAL LOW (ref >=60)
GLUCOSE: 199 mg/dL — AB (ref 65–139)
Potassium: 4.1 mmol/L (ref 3.5–5.3)
SODIUM: 139 mmol/L (ref 135–146)

## 2017-08-31 ENCOUNTER — Ambulatory Visit
Admission: RE | Admit: 2017-08-31 | Discharge: 2017-08-31 | Disposition: A | Discharge status: Home / Self Care | Payer: BC Managed Care – PPO | Source: Ambulatory Visit | Attending: Interventional Radiology | Admitting: Interventional Radiology

## 2017-08-31 ENCOUNTER — Encounter: Payer: Self-pay | Admitting: *Deleted

## 2017-08-31 DIAGNOSIS — N2889 Other specified disorders of kidney and ureter: Secondary | ICD-10-CM

## 2017-08-31 HISTORY — PX: IR RADIOLOGIST EVAL & MGMT: IMG5224

## 2017-08-31 NOTE — Progress Notes (Signed)
Patient ID: [name], female   DOB: [birth year], 57 y.o.   MRN: [id number]         Chief Complaint: Post cryoablation of 2 right sided renal lesions  Referring Physician(s): Bell  History of Present Illness: [name] is a 57 y.o. female with past medical history significant for diabetes, hypertension, hyperlipidemia, heart murmur and thyroid cancer who was found to have 2 indeterminate right-sided renal lesions on abdominal CT performed on 11/28/2015 for the work-up of chronic mid abdominal pain.  Patient was initially seen in consultation at the interventional radiology department on 01/12/2017 and the decision was made to pursue continued conservative management. Unfortunately subsequent abdominal MRI performed 06/13/2017 demonstrated interval growth of both right-sided renal lesions and as such, the decision was made to pursue image guided cryoablation.  Patient underwent technically successful CT-guided cryoablation of both right-sided renal lesions on 08/05/2017 and returns today for post procedural evaluation and management.  The patient is accompanied by her husband, daughter and grandchildren.  While the patient was discharged uneventfully the day after the cryoablation, patient return to the emergency department on 08/13/2017 with chest pain and pressure however work-up for pulmonary embolism was ultimately negative and the patient was subsequent discharged home.  Note, a approximately 6 mm pulmonary nodule was noted within the left lower lobe however this is unchanged compared to the 03/2014 examination and thus of benign etiology.  Patient still complains of mild chest pain shortness of breath.  Patient also complains of mild right-sided flank pain.  She also reports mild dysuria and while she denies hematuria she states that she notices a small amount of blood after she wipes herself following urination.  Patient denies fever or chills.  No change in energy level.  Patient overall  appears somewhat sullen.     Past Medical History:  Diagnosis Date  . Anemia    hx of   . 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 Asthma    due to allergies   . Cancer ([place])   . Complication of anesthesia   . Diabetes mellitus   . Dyspnea    increased exertion   . Dysrhythmia    " irregular heart rate per patient"  . Family history of adverse reaction to anesthesia    difficult for father to wake after surgery  . Heart murmur   . Hypercholesteremia   . Hypertension   . PONV (postoperative nausea and vomiting)   . Thyroid disease   . TIA (transient ischemic attack)     Past Surgical History:  Procedure Laterality Date  . ABDOMINAL HYSTERECTOMY    . CHOLECYSTECTOMY    . COLON SURGERY    . IR RADIOLOGIST EVAL & MGMT  01/12/2017  . IR RADIOLOGIST EVAL & MGMT  06/16/2017  . KNEE ARTHROSCOPY    . LEFT HEART CATHETERIZATION WITH CORONARY ANGIOGRAM N/A 04/24/2014   Procedure: LEFT HEART CATHETERIZATION WITH CORONARY ANGIOGRAM;  Surgeon: Burnell Blanks, MD;  Location: [facility] CATH LAB;  Service: Cardiovascular;  Laterality: N/A;  . RADIOFREQUENCY ABLATION Right 08/05/2017   Procedure: CT RENAL CRYO AND BIOPSY;  Surgeon: Sandi Mariscal, MD;  Location: [facility] ORS;  Service: Anesthesiology;  Laterality: Right;  . THYROIDECTOMY N/A 09/20/2014   Procedure: TOTAL THYROIDECTOMY;  Surgeon: Armandina Gemma, MD;  Location: [facility] ORS;  Service: General;  Laterality: N/A;  . TONSILLECTOMY      Allergies: Lorabid [loracarbef]; Augmentin [amoxicillin-pot clavulanate]; Codeine; Gadolinium derivatives; Sulfa drugs cross reactors; Benicar [olmesartan medoxomil];  and Other  Medications: Prior to Admission medications   Medication Sig Start Date End Date Taking? Authorizing Provider  acetaminophen (TYLENOL) 500 MG tablet Take 2 tablets (1,000 mg total) by mouth every 6 (six) hours as needed (body aches). 06/29/17   Geradine Girt, DO  albuterol (PROVENTIL HFA;VENTOLIN HFA) 108 (90 BASE) MCG/ACT inhaler Inhale 2  puffs into the lungs every 4 (four) hours as needed for wheezing or shortness of breath. 09/07/13   Leeanne Rio, MD  b complex vitamins tablet Take 1 tablet by mouth daily.    [provider]  calcium-vitamin D (OSCAL WITH D) 500-200 MG-UNIT tablet Take 2 tablets by mouth 3 (three) times daily. 05/17/16   Geradine Girt, DO  clopidogrel (PLAVIX) 75 MG tablet Take 1 tablet (75 mg total) by mouth daily. 09/15/15   Florencia Reasons, MD  diphenhydrAMINE (BENADRYL) 25 MG tablet Take 25-50 mg by mouth at bedtime.     [provider]  furosemide (LASIX) 40 MG tablet Take 2 tablets (80 mg total) by mouth 2 (two) times daily. 05/17/16   Geradine Girt, DO  lansoprazole (PREVACID) 15 MG capsule Take 1 capsule (15 mg total) by mouth daily at 12 noon. 03/07/15   Little, Wenda Overland, MD  levothyroxine (SYNTHROID, LEVOTHROID) 300 MCG tablet Take 300 mcg by mouth daily before breakfast.    [provider]  lisinopril (PRINIVIL,ZESTRIL) 10 MG tablet Take 10 mg by mouth daily.  09/09/14   [provider]  metFORMIN (GLUCOPHAGE) 1000 MG tablet Take 1 tablet (1,000 mg total) by mouth 2 (two) times daily with a meal. 04/29/14   Cristal Ford, DO  metoprolol tartrate (LOPRESSOR) 100 MG tablet Take 100 mg by mouth 2 (two) times daily. 06/23/16   [provider]  Multiple Vitamins-Minerals (MULTIVITAMIN GUMMIES WOMENS) CHEW Chew 2 each by mouth daily.    [provider]  Omega-3 Fatty Acids (FISH OIL PO) Take 1 capsule by mouth daily.    [provider]  ondansetron (ZOFRAN-ODT) 8 MG disintegrating tablet Take 8 mg by mouth 3 (three) times daily as needed for nausea or vomiting.    [provider]  potassium chloride SA (K-DUR,KLOR-CON) 20 MEQ tablet Take 40 mEq by mouth 2 (two) times daily.     [provider]  pravastatin (PRAVACHOL) 20 MG tablet Take 20 mg by mouth daily.  06/25/13   [provider]  Probiotic Product (PROBIOTIC PO)  Take 1 tablet by mouth daily.    [provider]  sitaGLIPtin (JANUVIA) 100 MG tablet Take 100 mg by mouth daily.    [provider]     Family History  Problem Relation Age of Onset  . Heart failure Mother   . Diabetes Mother   . Stroke Father   . Heart failure Father   . Cancer Father     Social History   Socioeconomic History  . Marital status: Married    Spouse name: Glendell Docker  . Number of children: 2  . Years of education: 63  . Highest education level: Not on file  Occupational History    Comment: NA, disabled  Social Needs  . Financial resource strain: Not on file  . Food insecurity:    Worry: Not on file    Inability: Not on file  . Transportation needs:    Medical: Not on file    Non-medical: Not on file  Tobacco Use  . Smoking status: Never Smoker  . Smokeless tobacco: Never  Used  Substance and Sexual Activity  . Alcohol use: No  . Drug use: No  . Sexual activity: Not on file  Lifestyle  . Physical activity:    Days per week: Not on file    Minutes per session: Not on file  . Stress: Not on file  Relationships  . Social connections:    Talks on phone: Not on file    Gets together: Not on file    Attends religious service: Not on file    Active member of club or organization: Not on file    Attends meetings of clubs or organizations: Not on file    Relationship status: Not on file  Other Topics Concern  . Not on file  Social History Narrative   Lives with husband and son    ECOG Status: 1 - Symptomatic but completely ambulatory  Review of Systems: A 12 point ROS discussed and pertinent positives are indicated in the HPI above.  All other systems are negative.  Review of Systems  Constitutional: Negative for activity change, fever and unexpected weight change.  Respiratory: Positive for shortness of breath.   Cardiovascular: Positive for chest pain.  Genitourinary: Positive for dysuria and flank pain. Negative for hematuria.        Patient denies hematuria though states that she notices blood after wiping herself after urination.  Psychiatric/Behavioral: Positive for dysphoric mood.    Vital Signs: BP 118/80   Pulse 83   Temp 98.5 F (36.9 C) (Oral)   Resp 15   Ht 5\' 6"  (1.676 m)   Wt 242 lb (109.8 kg)   SpO2 98%   BMI 39.06 kg/m   Physical Exam  Constitutional: She appears well-developed and well-nourished.  Abdominal: There is tenderness.  Well-healed flank cryoablation access sites  Psychiatric:  Patient appears somewhat sullen.   Nursing note and vitals reviewed.   Imaging:  Selected images from the following examinations were reviewed in detail with the patient and the patient's family: CT-guided right renal cryoablation - 08/05/2017 Abdominal MRI - 06/13/2017 Chest CT - 08/13/2017; 04/19/2014  Dg Chest 2 View  Result Date: 08/13/2017 CLINICAL DATA:  Chest pain onset Wednesday with exertional dyspnea on Thursday. EXAM: CHEST - 2 VIEW COMPARISON:  CXR 09/13/2015 FINDINGS: The heart size and mediastinal contours are within normal limits. Mild uncoiling of the thoracic aorta. Both lungs are clear. The visualized skeletal structures are unremarkable. IMPRESSION: No active cardiopulmonary disease. Electronically Signed   By: Ashley Royalty M.D.   On: 08/13/2017 19:54   Ct Angio Chest Pe W/cm &/or Wo Cm  Result Date: 08/13/2017 CLINICAL DATA:  Acute onset of generalized chest pressure and shortness of breath. EXAM: CT ANGIOGRAPHY CHEST WITH CONTRAST TECHNIQUE: Multidetector CT imaging of the chest was performed using the standard protocol during bolus administration of intravenous contrast. Multiplanar CT image reconstructions and MIPs were obtained to evaluate the vascular anatomy. CONTRAST:  17mL ISOVUE-370 IOPAMIDOL (ISOVUE-370) INJECTION 76% COMPARISON:  CTA of the chest performed 04/25/2016 FINDINGS: Cardiovascular:  There is no evidence of pulmonary embolus. The heart is normal in size. Diffuse coronary  artery calcifications are seen. The thoracic aorta is grossly unremarkable. The great vessels are within normal limits. Mediastinum/Nodes: The patient is status post thyroidectomy. The mediastinum is otherwise unremarkable. No mediastinal lymphadenopathy is seen. No pericardial effusion is identified. No axillary lymphadenopathy is seen. Lungs/Pleura: A 5 mm nodule is noted at the left lung base (image 54 of 84). The lungs are otherwise clear. No pleural  effusion or pneumothorax is seen. Upper Abdomen: The visualized portions of the liver and spleen are unremarkable. The patient is status post cholecystectomy, with clips noted at the gallbladder fossa. The visualized portions of the pancreas and adrenal glands are within normal limits. Musculoskeletal: No acute osseous abnormalities are identified. The visualized musculature is unremarkable in appearance. Review of the MIP images confirms the above findings. IMPRESSION: 1. No evidence of pulmonary embolus. 2. 5 mm nodule at the left lung base. No follow-up needed if patient is low-risk. Non-contrast chest CT can be considered in 12 months if patient is high-risk. This recommendation follows the consensus statement: Guidelines for Management of Incidental Pulmonary Nodules Detected on CT Images: From the Fleischner Society 2017; Radiology 2017; 284:228-243. 3. Diffuse coronary artery calcifications seen. Electronically Signed   By: Garald Balding M.D.   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 Please refer to recent consultation in the epic EMR dated 06/16/2017 for additional details. EXAM: CT GUIDED ABLATION x2 ANESTHESIA/SEDATION: General MEDICATIONS: Vancomycin 1 gm IV. The antibiotic was administered in an appropriate time interval prior to needle puncture of the skin. CONTRAST:  158mL OMNIPAQUE IOHEXOL  300 MG/ML  SOLN PROCEDURE: The procedure, risks, benefits, and alternatives were explained to the patient. Questions regarding the procedure were encouraged and answered. The patient understands and consents to the procedure. The patient was placed under general anesthesia. Initial un-enhanced CT was performed in a prone position to localize the right kidney as well as the approximately 1.9 x 1.6 cm partially exophytic lesion arising from the superior pole the right kidney (37, series 2). The additional known approximately 1.1 cm lesion within the posterior interpolar aspect the right kidney is not well evaluated on noncontrast imaging. The skin overlying the right back and flank was prepped with Betadine in a sterile fashion, and a sterile drape was applied covering the operative field. A sterile gown and sterile gloves were used for the procedure. Under CT guidance, a 22 gauge spinal needle was utilized for procedural planning purposes to target both the dominant lesion arising from the superior pole of the right kidney as well as the ill-defined lesion within the posterior interpolar aspect the right kidney. Next, contrast was administered intravenously and CT imaging was performed confirming lesion location. Next, Galil IcePearl percutaneous cryoablation probes were advanced into both lesions. Probe positioning was confirmed by CT and both probes were stuck in place. Next, a 22 gauge needle was advanced along the interpolar lesion cryoablation probe and a mixture of saline and dilute contrast was administered for the purposes of hydrodissection. Cryoablation was performed through the both cryoablation probes. Initial 10 minute cycle of cryoablation was performed. This was followed by a 10 minute thaw cycle. A second 10 minute cycle of cryoablation was then performed. During ablation, periodic CT imaging was performed to monitor ice ball formation and morphology. After active thaw, the cryoablation probes were  removed following tract ablation. Dressings were placed. The patient tolerated the above procedure well without immediate postprocedural complication. COMPLICATIONS: None immediate. FINDINGS: Initial precontrast imaging demonstrates grossly unchanged appearance of known 1.9 x 1.6 cm partially exophytic lesion arising from the superior pole the right kidney (37, series 2). As the additional known approximately 1.1 cm lesion within the posterior interpolar aspect of the right kidney was not well evaluated on noncontrast imaging, intravenous contrast was administered  confirming grossly unchanged size and appearance of the approximately 1.2 x 1.1 cm hypoattenuating lesion (image 17, series 9). After cryoablation probe positioning, completion images demonstrate both ice balls encompassing both lesions without evidence of complication, specifically, no evidence media urine leak or perinephric hematoma. IMPRESSION: CT guided percutaneous cryoablation of both right-sided renal lesions. PLAN: - The patient will be admitted overnight for continued observation and PCA usage. - The patient will be seen in consultation in 3-4 weeks at the interventional radiology clinic. - Initial postprocedural surveillance imaging (abdominal MRI) will be obtained in 3 months (August 2019). Electronically Signed   By: Sandi Mariscal M.D.   On: 08/05/2017 17:28    Labs:  CBC: Recent Labs    06/27/17 0327 08/02/17 1356 08/05/17 1031 08/13/17 1940  WBC 7.2 6.8 8.4 6.6  HGB 11.3* 13.0 13.7 12.7  HCT 34.4* 40.0 41.3 40.1  PLT 230 284 294 303    COAGS: Recent Labs    08/05/17 1031  INR 0.99    BMP: Recent Labs    08/02/17 1356 08/05/17 1031 08/13/17 1940 08/29/17 0000  NA 140 140 136 139  K 3.9 3.6 3.7 4.1  CL 103 104 104 105  CO2 25 22 23 25   GLUCOSE 213* 228* 273* 199*  BUN 21* 17 16 23   CALCIUM 9.0 8.8* 8.3* 9.1  CREATININE 1.10* 1.01* 1.18* 1.12*  GFRNONAA 55* >60 51* 55*  GFRAA >60 >60 59* 64    LIVER  FUNCTION TESTS: Recent Labs    01/21/17 0419 06/26/17 1911  BILITOT 0.7 0.5  AST 17 22  ALT 21 20  ALKPHOS 70 62  PROT 6.7 6.6  ALBUMIN 3.6 3.6    TUMOR MARKERS: No results for input(s): AFPTM, CEA, CA199, CHROMGRNA in the last 8760 hours.  Assessment and Plan:  Phelan Goers is a 57 y.o. female with past medical history significant for diabetes, hypertension, hyperlipidemia, heart murmur and thyroid cancer who was found to have 2 indeterminate right-sided renal lesions on abdominal CT performed on 11/28/2015 for the work-up of chronic mid abdominal pain, for which the patient underwent technically successful CT-guided cryoablation of both right-sided renal lesions on 08/05/2017.  Selected images from the following examinations were reviewed in detail with the patient and the patient's family: CT-guided right renal cryoablation - 08/05/2017 Abdominal MRI - 06/13/2017 Chest CT - 08/13/2017; 04/19/2014  Images from the right-sided renal cryoablation demonstrate a technically excellent result with the cryoablation ice balls encompassing the entirety of both right-sided renal lesions.   Right lower lobe pulmonary nodule seen on recent chest CT performed 08/13/2017 is unchanged compared to remote chest CT performed 03/2014.  I explained in great length to the patient that stability for greater than 2 years, is indicative of a benign etiology, even in spite of patient's personal history of thyroid and renal cell carcinoma, for which the patient demonstrated fair understanding.  Patient reports noticing a small amount of blood after wiping herself during urination, she denies hematuria as such, I explained that gynecological evaluation may be warranted.    Patient does report mild postprocedural flank pain, however I explained that this is an expected finding 1 month following the cryoablation and that she should continue to advance her activities as tolerated.  Overall, the patient appears somewhat  sullen with lack of specific worrisome symptoms and as such, I do not feel urgent imaging is warranted and rather will maintain original plan of obtaining an initial surveillance MRI 3 months following the renal cryoablation (  mid August), after which the patient will return to the IR clinic for consultation.  Patient was instructed to call the interventional radiology clinic with any interval questions or concerns.   Thank you for this interesting consult.  I greatly enjoyed meeting Dariane Natzke and look forward to participating in their care.  A copy of this report was sent to the requesting provider on this date.  Electronically Signed: Sandi Mariscal 08/31/2017, 2:45 PM   I spent a total of 15 Minutes in face to face in clinical consultation, greater than 50% of which was counseling/coordinating care for post right sided renal cryoablation x2.

## 2017-09-15 ENCOUNTER — Other Ambulatory Visit: Payer: Self-pay

## 2017-09-15 ENCOUNTER — Emergency Department (HOSPITAL_COMMUNITY)
Admission: EM | Admit: 2017-09-15 | Discharge: 2017-09-15 | Disposition: A | Discharge status: Home / Self Care | Payer: BC Managed Care – PPO | Attending: Emergency Medicine | Admitting: Emergency Medicine

## 2017-09-15 ENCOUNTER — Encounter (HOSPITAL_COMMUNITY): Payer: Self-pay | Admitting: *Deleted

## 2017-09-15 DIAGNOSIS — Z79899 Other long term (current) drug therapy: Secondary | ICD-10-CM | POA: Insufficient documentation

## 2017-09-15 DIAGNOSIS — R300 Dysuria: Secondary | ICD-10-CM | POA: Diagnosis present

## 2017-09-15 DIAGNOSIS — N390 Urinary tract infection, site not specified: Secondary | ICD-10-CM | POA: Diagnosis not present

## 2017-09-15 DIAGNOSIS — I2510 Atherosclerotic heart disease of native coronary artery without angina pectoris: Secondary | ICD-10-CM | POA: Insufficient documentation

## 2017-09-15 DIAGNOSIS — E119 Type 2 diabetes mellitus without complications: Secondary | ICD-10-CM | POA: Insufficient documentation

## 2017-09-15 DIAGNOSIS — J45909 Unspecified asthma, uncomplicated: Secondary | ICD-10-CM | POA: Diagnosis not present

## 2017-09-15 DIAGNOSIS — I10 Essential (primary) hypertension: Secondary | ICD-10-CM | POA: Insufficient documentation

## 2017-09-15 LAB — URINALYSIS, ROUTINE W REFLEX MICROSCOPIC
Bilirubin Urine: NEGATIVE
Glucose, UA: NEGATIVE mg/dL
KETONES UR: NEGATIVE mg/dL
Nitrite: NEGATIVE
PROTEIN: NEGATIVE mg/dL
Specific Gravity, Urine: 1.006 (ref 1.005–1.030)
pH: 5 (ref 5.0–8.0)

## 2017-09-15 LAB — CBC WITH DIFFERENTIAL/PLATELET
Abs Immature Granulocytes: 0 10*3/uL (ref 0.0–0.1)
BASOS ABS: 0 10*3/uL (ref 0.0–0.1)
BASOS PCT: 0 %
EOS PCT: 3 %
Eosinophils Absolute: 0.2 10*3/uL (ref 0.0–0.7)
HCT: 40.4 % (ref 36.0–46.0)
Hemoglobin: 12.9 g/dL (ref 12.0–15.0)
Immature Granulocytes: 0 %
Lymphocytes Relative: 37 %
Lymphs Abs: 2.5 10*3/uL (ref 0.7–4.0)
MCH: 29.1 pg (ref 26.0–34.0)
MCHC: 31.9 g/dL (ref 30.0–36.0)
MCV: 91 fL (ref 78.0–100.0)
MONO ABS: 0.5 10*3/uL (ref 0.1–1.0)
Monocytes Relative: 8 %
NEUTROS ABS: 3.6 10*3/uL (ref 1.7–7.7)
Neutrophils Relative %: 52 %
PLATELETS: 240 10*3/uL (ref 150–400)
RBC: 4.44 MIL/uL (ref 3.87–5.11)
RDW: 13.4 % (ref 11.5–15.5)
WBC: 6.9 10*3/uL (ref 4.0–10.5)

## 2017-09-15 LAB — BASIC METABOLIC PANEL
Anion gap: 12 (ref 5–15)
BUN: 18 mg/dL (ref 6–20)
CO2: 24 mmol/L (ref 22–32)
CREATININE: 1.16 mg/dL — AB (ref 0.44–1.00)
Calcium: 8.6 mg/dL — ABNORMAL LOW (ref 8.9–10.3)
Chloride: 103 mmol/L (ref 101–111)
GFR calc Af Amer: >60 mL/min (ref >60)
GFR, EST NON AFRICAN AMERICAN: 52 mL/min — AB (ref >60)
Glucose, Bld: 228 mg/dL — ABNORMAL HIGH (ref 65–99)
Potassium: 3.9 mmol/L (ref 3.5–5.1)
Sodium: 139 mmol/L (ref 135–145)

## 2017-09-15 MED ORDER — ONDANSETRON 4 MG PO TBDP
4.0000 mg | ORAL_TABLET | Freq: Once | ORAL | Status: AC
  Administered 2017-09-15: 4 mg via ORAL
  Filled 2017-09-15: qty 1

## 2017-09-15 MED ORDER — CEPHALEXIN 250 MG PO CAPS
500.0000 mg | ORAL_CAPSULE | Freq: Once | ORAL | Status: AC
  Administered 2017-09-15: 500 mg via ORAL
  Filled 2017-09-15: qty 2

## 2017-09-15 MED ORDER — CEPHALEXIN 500 MG PO CAPS: 500.0000 mg | ORAL_CAPSULE | Freq: Four times a day (QID) | ORAL | 0 refills | Status: DC

## 2017-09-15 NOTE — ED Provider Notes (Signed)
[facility] EMERGENCY DEPARTMENT Provider Note   CSN: [id number] Arrival date & time: 09/15/17  0202     History   Chief Complaint Chief Complaint  Patient presents with  . Urinary Retention  . Urinary Tract Infection    HPI [name] is a 57 y.o. female.  Patient is a 57 year old female with past medical history of thoracic aortic aneurysm, diabetes, hypertension, papillary adenocarcinoma of the right kidney with a recent radiofrequency ablation to the area by IR who is presenting today with 1 week of worsening dysuria and frequency.  Patient states urgency started today and she feels like she needs to urinate but only a few drops come out.  She is also noticed intermittent hematuria.  She has had intermittent pain in the right kidney that is been persistent since May when she had the radiofrequency ablation.  She denies any fever, nausea or vomiting.  She denies any vaginal symptoms at this time.  The history is provided by the patient.  Urinary Tract Infection   This is a recurrent problem. The quality of the pain is described as burning and aching. There has been no fever. She is sexually active. There is a history of pyelonephritis. Associated symptoms include frequency, urgency and flank pain. Pertinent negatives include no chills, no nausea, no vomiting and no discharge. She has tried nothing for the symptoms.    Past Medical History:  Diagnosis Date  . Anemia    hx of   . 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 Asthma    due to allergies   . Cancer ([place])   . Complication of anesthesia   . Diabetes mellitus   . Dyspnea    increased exertion   . Dysrhythmia    " irregular heart rate per patient"  . Family history of adverse reaction to anesthesia    difficult for father to wake after surgery  . Heart murmur   . Hypercholesteremia   . Hypertension   . PONV (postoperative nausea and vomiting)   . Thyroid disease   . TIA (transient ischemic attack)       Patient Active Problem List   Diagnosis Date Noted  . Papillary adenocarcinoma, renal, right ([place]) 08/05/2017  . UTI (urinary tract infection) 06/26/2017  . Urinary tract infection 01/24/2017  . Abdominal pain 01/21/2017  . Stroke-like symptoms 05/16/2016  . 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 Fatty liver disease, nonalcoholic 99/83/3825  . Slurred speech 09/13/2015  . Parotitis 09/13/2015  . Gastroenteritis 08/21/2015  . Hypothyroid 08/21/2015  . 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 Pain in the chest   . Essential hypertension   . Thoracic aortic aneurysm ([place])   . Chest pain 04/23/2014  . Anginal pain ([place]) 04/23/2014  . Type 2 diabetes mellitus ([place])   . Hypertension   . Hypercholesteremia     Past Surgical History:  Procedure Laterality Date  . ABDOMINAL HYSTERECTOMY    . CHOLECYSTECTOMY    . COLON SURGERY    . IR RADIOLOGIST EVAL & MGMT  01/12/2017  . IR RADIOLOGIST EVAL & MGMT  06/16/2017  . IR RADIOLOGIST EVAL & MGMT  08/31/2017  . KNEE ARTHROSCOPY    . LEFT HEART CATHETERIZATION WITH CORONARY ANGIOGRAM N/A 04/24/2014   Procedure: LEFT HEART CATHETERIZATION WITH CORONARY ANGIOGRAM;  Surgeon: Burnell Blanks, MD;  Location: [facility] CATH LAB;  Service: Cardiovascular;  Laterality: N/A;  . RADIOFREQUENCY  ABLATION Right 08/05/2017   Procedure: CT RENAL CRYO AND BIOPSY;  Surgeon: Sandi Mariscal, MD;  Location: [facility] ORS;  Service: Anesthesiology;  Laterality: Right;  . THYROIDECTOMY N/A 09/20/2014   Procedure: TOTAL THYROIDECTOMY;  Surgeon: Armandina Gemma, MD;  Location: [facility] ORS;  Service: General;  Laterality: N/A;  . TONSILLECTOMY       OB History   None      Home Medications    Prior to Admission medications   Medication Sig Start Date End Date Taking? Authorizing Provider  acetaminophen (TYLENOL) 500 MG tablet Take 2 tablets (1,000 mg total) by mouth every 6 (six) hours as needed (body  aches). 06/29/17   Geradine Girt, DO  albuterol (PROVENTIL HFA;VENTOLIN HFA) 108 (90 BASE) MCG/ACT inhaler Inhale 2 puffs into the lungs every 4 (four) hours as needed for wheezing or shortness of breath. 09/07/13   Leeanne Rio, MD  b complex vitamins tablet Take 1 tablet by mouth daily.    [provider]  calcium-vitamin D (OSCAL WITH D) 500-200 MG-UNIT tablet Take 2 tablets by mouth 3 (three) times daily. 05/17/16   Geradine Girt, DO  clopidogrel (PLAVIX) 75 MG tablet Take 1 tablet (75 mg total) by mouth daily. 09/15/15   Florencia Reasons, MD  diphenhydrAMINE (BENADRYL) 25 MG tablet Take 25-50 mg by mouth at bedtime.     [provider]  furosemide (LASIX) 40 MG tablet Take 2 tablets (80 mg total) by mouth 2 (two) times daily. 05/17/16   Geradine Girt, DO  lansoprazole (PREVACID) 15 MG capsule Take 1 capsule (15 mg total) by mouth daily at 12 noon. 03/07/15   Little, Wenda Overland, MD  levothyroxine (SYNTHROID, LEVOTHROID) 300 MCG tablet Take 300 mcg by mouth daily before breakfast.    [provider]  lisinopril (PRINIVIL,ZESTRIL) 10 MG tablet Take 10 mg by mouth daily.  09/09/14   [provider]  metFORMIN (GLUCOPHAGE) 1000 MG tablet Take 1 tablet (1,000 mg total) by mouth 2 (two) times daily with a meal. 04/29/14   Cristal Ford, DO  metoprolol tartrate (LOPRESSOR) 100 MG tablet Take 100 mg by mouth 2 (two) times daily. 06/23/16   [provider]  Multiple Vitamins-Minerals (MULTIVITAMIN GUMMIES WOMENS) CHEW Chew 2 each by mouth daily.    [provider]  Omega-3 Fatty Acids (FISH OIL PO) Take 1 capsule by mouth daily.    [provider]  ondansetron (ZOFRAN-ODT) 8 MG disintegrating tablet Take 8 mg by mouth 3 (three) times daily as needed for nausea or vomiting.    [provider]  potassium chloride SA (K-DUR,KLOR-CON) 20 MEQ tablet Take 40 mEq by mouth 2 (two) times daily.     [provider]  pravastatin  (PRAVACHOL) 20 MG tablet Take 20 mg by mouth daily.  06/25/13   [provider]  Probiotic Product (PROBIOTIC PO) Take 1 tablet by mouth daily.    [provider]  sitaGLIPtin (JANUVIA) 100 MG tablet Take 100 mg by mouth daily.    [provider]    Family History Family History  Problem Relation Age of Onset  . Heart failure Mother   . Diabetes Mother   . Stroke Father   . Heart failure Father   . Cancer Father     Social History Social History   Tobacco Use  . Smoking status: Never Smoker  . Smokeless tobacco: Never Used  Substance Use Topics  . Alcohol use: No  . Drug use: No  Allergies   Lorabid [loracarbef]; Augmentin [amoxicillin-pot clavulanate]; Codeine; Gadolinium derivatives; Sulfa drugs cross reactors; Benicar [olmesartan medoxomil]; and Other   Review of Systems Review of Systems  Constitutional: Negative for chills.  Gastrointestinal: Negative for nausea and vomiting.  Genitourinary: Positive for flank pain, frequency and urgency.  All other systems reviewed and are negative.    Physical Exam Updated Vital Signs BP 119/81   Pulse 70   Temp 98.2 F (36.8 C) (Oral)   Resp 17   Ht 5\' 6"  (1.676 m)   Wt 108.4 kg (239 lb)   SpO2 99%   BMI 38.58 kg/m   Physical Exam  Constitutional: She is oriented to person, place, and time. She appears well-developed and well-nourished. No distress.  HENT:  Head: Normocephalic and atraumatic.  Mouth/Throat: Oropharynx is clear and moist.  Eyes: Pupils are equal, round, and reactive to light. Conjunctivae and EOM are normal.  Neck: Normal range of motion. Neck supple.  Cardiovascular: Normal rate, regular rhythm and intact distal pulses.  No murmur heard. Pulmonary/Chest: Effort normal and breath sounds normal. No respiratory distress. She has no wheezes. She has no rales.  Abdominal: Soft. She exhibits no distension. There is tenderness in the suprapubic area. There is CVA tenderness.  There is no rebound and no guarding.  Mild right flank tenderness  Musculoskeletal: Normal range of motion. She exhibits no edema or tenderness.  Neurological: She is alert and oriented to person, place, and time.  Skin: Skin is warm and dry. No rash noted. No erythema.  Psychiatric: She has a normal mood and affect. Her behavior is normal.  Nursing note and vitals reviewed.    ED Treatments / Results  Labs (all labs ordered are listed, but only abnormal results are displayed) Labs Reviewed  URINALYSIS, ROUTINE W REFLEX MICROSCOPIC - Abnormal; Notable for the following components:      Result Value   APPearance HAZY (*)    Hgb urine dipstick SMALL (*)    Leukocytes, UA MODERATE (*)    Bacteria, UA RARE (*)    All other components within normal limits  BASIC METABOLIC PANEL - Abnormal; Notable for the following components:   Glucose, Bld 228 (*)    Creatinine, Ser 1.16 (*)    Calcium 8.6 (*)    GFR calc non Af Amer 52 (*)    All other components within normal limits  URINE CULTURE  CBC WITH DIFFERENTIAL/PLATELET    EKG None  Radiology No results found.  Procedures Procedures (including critical care time)  Medications Ordered in ED Medications - No data to display   Initial Impression / Assessment and Plan / ED Course  I have reviewed the triage vital signs and the nursing notes.  Pertinent labs & imaging results that were available during my care of the patient were reviewed by me and considered in my medical decision making (see chart for details).    Presenting today with symptoms concerning for a UTI.  Symptoms have been present for 1 week.  She is complaining of some symptoms concerning for retention however bladder scan was done with only 23 mL's of urine present.  Patient is not having systemic symptoms concerning for pyelonephritis today such as nausea, vomiting or fever.  CBC and BMP at baseline without acute findings.  UA and culture pending.  5:13 AM Ua  concerning for possible developing infection with 11-20 WBC's and moderate leukocytes.  Urine cultured.  Last culture grew out Klebsiella that was sensitive to cephalosporins, Cipro  and Bactrim.  Will start patient on antibiotics and have her follow closely with her PCP.  Final Clinical Impressions(s) / ED Diagnoses   Final diagnoses:  Lower urinary tract infectious disease    ED Discharge Orders        Ordered    cephALEXin (KEFLEX) 500 MG capsule  4 times daily     09/15/17 Thereasa Distance, MD 09/15/17 0522

## 2017-09-15 NOTE — ED Triage Notes (Signed)
Pt has been having urinary retention tonight with R back pain. Has had two tumors frozen on R kidney. Reports having chills and being able to have a little urinary output at home but doesn't feel like she is emptying her bladder.

## 2017-09-17 LAB — URINE CULTURE

## 2017-09-18 ENCOUNTER — Telehealth: Payer: Self-pay

## 2017-09-18 NOTE — Telephone Encounter (Signed)
Post ED Visit - Positive Culture Follow-up  Culture report reviewed by antimicrobial stewardship pharmacist:  []  Elenor Quinones, Pharm.D. []  Heide Guile, Pharm.D., BCPS AQ-ID []  Parks Neptune, Pharm.D., BCPS []  Alycia Rossetti, Pharm.D., BCPS []  Wright, Pharm.D., BCPS, AAHIVP []  Legrand Como, Pharm.D., BCPS, AAHIVP []  Salome Arnt, PharmD, BCPS []  Wynell Balloon, PharmD []  Vincenza Hews, PharmD, BCPS Raymon Mutton Pharm D Positive urine culture Treated with Cephalexin, organism sensitive to the same and no further patient follow-up is required at this time.  Genia Del 09/18/2017, 9:55 AM

## 2017-09-21 ENCOUNTER — Other Ambulatory Visit: Payer: Self-pay

## 2017-09-21 ENCOUNTER — Encounter (HOSPITAL_COMMUNITY): Payer: Self-pay

## 2017-09-21 ENCOUNTER — Emergency Department (HOSPITAL_COMMUNITY): Payer: BC Managed Care – PPO

## 2017-09-21 ENCOUNTER — Emergency Department (HOSPITAL_COMMUNITY)
Admission: EM | Admit: 2017-09-21 | Discharge: 2017-09-21 | Disposition: A | Discharge status: Home / Self Care | Payer: BC Managed Care – PPO | Attending: Emergency Medicine | Admitting: Emergency Medicine

## 2017-09-21 DIAGNOSIS — E119 Type 2 diabetes mellitus without complications: Secondary | ICD-10-CM | POA: Insufficient documentation

## 2017-09-21 DIAGNOSIS — R1010 Upper abdominal pain, unspecified: Secondary | ICD-10-CM | POA: Insufficient documentation

## 2017-09-21 DIAGNOSIS — Z8673 Personal history of transient ischemic attack (TIA), and cerebral infarction without residual deficits: Secondary | ICD-10-CM | POA: Diagnosis not present

## 2017-09-21 DIAGNOSIS — Z7984 Long term (current) use of oral hypoglycemic drugs: Secondary | ICD-10-CM | POA: Diagnosis not present

## 2017-09-21 DIAGNOSIS — E039 Hypothyroidism, unspecified: Secondary | ICD-10-CM | POA: Insufficient documentation

## 2017-09-21 DIAGNOSIS — I10 Essential (primary) hypertension: Secondary | ICD-10-CM | POA: Diagnosis not present

## 2017-09-21 DIAGNOSIS — I2510 Atherosclerotic heart disease of native coronary artery without angina pectoris: Secondary | ICD-10-CM | POA: Diagnosis not present

## 2017-09-21 DIAGNOSIS — R109 Unspecified abdominal pain: Secondary | ICD-10-CM

## 2017-09-21 DIAGNOSIS — J45909 Unspecified asthma, uncomplicated: Secondary | ICD-10-CM | POA: Insufficient documentation

## 2017-09-21 DIAGNOSIS — Z7902 Long term (current) use of antithrombotics/antiplatelets: Secondary | ICD-10-CM | POA: Insufficient documentation

## 2017-09-21 LAB — COMPREHENSIVE METABOLIC PANEL
ALBUMIN: 3.7 g/dL (ref 3.5–5.0)
ALT: 20 U/L (ref 0–44)
AST: 20 U/L (ref 15–41)
Alkaline Phosphatase: 70 U/L (ref 38–126)
Anion gap: 11 (ref 5–15)
BUN: 14 mg/dL (ref 6–20)
CHLORIDE: 102 mmol/L (ref 98–111)
CO2: 24 mmol/L (ref 22–32)
CREATININE: 1.1 mg/dL — AB (ref 0.44–1.00)
Calcium: 8.7 mg/dL — ABNORMAL LOW (ref 8.9–10.3)
GFR calc Af Amer: >60 mL/min (ref >60)
GFR, EST NON AFRICAN AMERICAN: 55 mL/min — AB (ref >60)
GLUCOSE: 236 mg/dL — AB (ref 70–99)
Potassium: 3.6 mmol/L (ref 3.5–5.1)
Sodium: 137 mmol/L (ref 135–145)
Total Bilirubin: 0.8 mg/dL (ref 0.3–1.2)
Total Protein: 7.1 g/dL (ref 6.5–8.1)

## 2017-09-21 LAB — CBC WITH DIFFERENTIAL/PLATELET
ABS IMMATURE GRANULOCYTES: 0 10*3/uL (ref 0.0–0.1)
Basophils Absolute: 0 10*3/uL (ref 0.0–0.1)
Basophils Relative: 0 %
EOS PCT: 1 %
Eosinophils Absolute: 0.1 10*3/uL (ref 0.0–0.7)
HEMATOCRIT: 40.9 % (ref 36.0–46.0)
HEMOGLOBIN: 13 g/dL (ref 12.0–15.0)
Immature Granulocytes: 0 %
LYMPHS ABS: 1.8 10*3/uL (ref 0.7–4.0)
LYMPHS PCT: 25 %
MCH: 29 pg (ref 26.0–34.0)
MCHC: 31.8 g/dL (ref 30.0–36.0)
MCV: 91.1 fL (ref 78.0–100.0)
MONOS PCT: 6 %
Monocytes Absolute: 0.4 10*3/uL (ref 0.1–1.0)
NEUTROS ABS: 5 10*3/uL (ref 1.7–7.7)
Neutrophils Relative %: 68 %
Platelets: 272 10*3/uL (ref 150–400)
RBC: 4.49 MIL/uL (ref 3.87–5.11)
RDW: 13.3 % (ref 11.5–15.5)
WBC: 7.4 10*3/uL (ref 4.0–10.5)

## 2017-09-21 LAB — URINALYSIS, ROUTINE W REFLEX MICROSCOPIC
Bilirubin Urine: NEGATIVE
GLUCOSE, UA: NEGATIVE mg/dL
Hgb urine dipstick: NEGATIVE
Ketones, ur: NEGATIVE mg/dL
LEUKOCYTES UA: NEGATIVE
NITRITE: NEGATIVE
PROTEIN: NEGATIVE mg/dL
Specific Gravity, Urine: 1.012 (ref 1.005–1.030)
pH: 6 (ref 5.0–8.0)

## 2017-09-21 LAB — LIPASE, BLOOD: Lipase: 40 U/L (ref 11–51)

## 2017-09-21 MED ORDER — ONDANSETRON HCL 4 MG/2ML IJ SOLN
4.0000 mg | Freq: Once | INTRAMUSCULAR | Status: AC
  Administered 2017-09-21: 4 mg via INTRAVENOUS
  Filled 2017-09-21: qty 2

## 2017-09-21 MED ORDER — TRAMADOL HCL 50 MG PO TABS: 50.0000 mg | ORAL_TABLET | Freq: Four times a day (QID) | ORAL | 0 refills | Status: DC | PRN

## 2017-09-21 MED ORDER — FENTANYL CITRATE (PF) 100 MCG/2ML IJ SOLN
25.0000 ug | Freq: Once | INTRAMUSCULAR | Status: AC
  Administered 2017-09-21: 25 ug via INTRAVENOUS
  Filled 2017-09-21: qty 2

## 2017-09-21 MED ORDER — IOHEXOL 300 MG/ML  SOLN
100.0000 mL | Freq: Once | INTRAMUSCULAR | Status: AC | PRN
  Administered 2017-09-21: 100 mL via INTRAVENOUS

## 2017-09-21 NOTE — Discharge Instructions (Addendum)
You were seen in the emergency department today for abdominal pain.  Your work-up in the emergency department was reassuring.  Your CT scan did not identify a cause of your pain.  Your labs are reassuring.  Your urine did not appear infected.  We are unsure the exact cause of your symptoms.  For this reason we would like you to follow-up closely with your primary care provider as well as your kidney doctor.  Please see your primary care provider within 2 days.  You home with prescription for tramadol, you may take this medication every 6 hours as needed for pain.  You may take Tylenol with this medication safely.We have prescribed you new medication(s) today. Discuss the medications prescribed today with your pharmacist as they can have adverse effects and interactions with your other medicines including over the counter and prescribed medications. Seek medical evaluation if you start to experience new or abnormal symptoms after taking one of these medicines, seek care immediately if you start to experience difficulty breathing, feeling of your throat closing, facial swelling, or rash as these could be indications of a more serious allergic reaction  As above please follow-up with your primary care provider closely.  Return to the ER anytime for new or worsening symptoms or any other concerns.

## 2017-09-21 NOTE — ED Triage Notes (Signed)
Patient complains of ongoing dysuria with nausea and right side pain after being diagnosed with UTI last week. Taking antibiotics as prescribed and reports not feeling any better

## 2017-09-21 NOTE — ED Provider Notes (Signed)
[facility] EMERGENCY DEPARTMENT Provider Note   CSN: [id number] Arrival date & time: 09/21/17  1352     History   Chief Complaint No chief complaint on file.   HPI [name] is a 58 y.o. 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 Patient seen 06/20 for urinary sxs and diagnosed with UTI, culture ordered, placed on Keflex QID for 7 days, taking as prescribed with today being 6th day of abx. She states she has started to feel gradually worse. She was initially have suprapubic pain which remains present but is now radiating to upper abdomen. Pain is constant. Rates it an 8/10 in severity, worse with urination, no alleviating factors. Taking Tylenol without relief. She has had urinary urgency. Reports nausea without vomiting. When questioned further patient states she has had intermittent bright red blood on toilet paper with bowel movements, not new, she has internal hemorrhoids that she states this is from. Last BM this AM.  She reports baseline loose stools which have been unchanged. Also having subjective fever and chills.  Denies dysuria, vomiting, constipation, or urinary frequency.  HPI  Past Medical History:  Diagnosis Date  . Anemia    hx of   . 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 Asthma    due to allergies   . Cancer ([place])   . Complication of anesthesia   . Diabetes mellitus   . Dyspnea    increased exertion   . Dysrhythmia    " irregular heart rate per patient"  . Family history of adverse reaction to anesthesia    difficult for father to wake after surgery  . Heart murmur   . Hypercholesteremia   . Hypertension   . PONV (postoperative nausea and vomiting)   . Thyroid disease   . TIA (transient ischemic attack)     Patient  Active Problem List   Diagnosis Date Noted  . Papillary adenocarcinoma, renal, right ([place]) 08/05/2017  . UTI (urinary tract infection) 06/26/2017  . Urinary tract infection 01/24/2017  . Abdominal pain 01/21/2017  . Stroke-like symptoms 05/16/2016  . 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 Fatty liver disease, nonalcoholic 99/37/1696  . Slurred speech 09/13/2015  . Parotitis 09/13/2015  . Gastroenteritis 08/21/2015  . Hypothyroid 08/21/2015  . 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 Pain in the chest   . Essential hypertension   . Thoracic aortic aneurysm ([place])   . Chest pain 04/23/2014  . Anginal pain ([place]) 04/23/2014  . Type 2 diabetes mellitus ([place])   . Hypertension   . Hypercholesteremia     Past Surgical History:  Procedure Laterality Date  . ABDOMINAL HYSTERECTOMY    . CHOLECYSTECTOMY    . COLON SURGERY    . IR RADIOLOGIST EVAL & MGMT  01/12/2017  . IR RADIOLOGIST EVAL & MGMT  06/16/2017  . IR RADIOLOGIST EVAL & MGMT  08/31/2017  . KNEE ARTHROSCOPY    . LEFT HEART CATHETERIZATION WITH CORONARY ANGIOGRAM N/A 04/24/2014   Procedure: LEFT HEART CATHETERIZATION WITH CORONARY ANGIOGRAM;  Surgeon: Burnell Blanks, MD;  Location[facility] CATH LAB;  Service: Cardiovascular;  Laterality: N/A;  . RADIOFREQUENCY ABLATION Right 08/05/2017   Procedure: CT RENAL CRYO AND BIOPSY;  Surgeon: Sandi Mariscal, MD;  Location: [facility] ORS;  Service: Anesthesiology;  Laterality: Right;  . THYROIDECTOMY N/A 09/20/2014   Procedure: TOTAL THYROIDECTOMY;  Surgeon: Armandina Gemma, MD;  Location: [facility] ORS;  Service: General;  Laterality: N/A;  . TONSILLECTOMY       OB History   None      Home Medications    Prior to Admission medications   Medication Sig Start Date End Date Taking? Authorizing Provider  acetaminophen (TYLENOL) 500 MG tablet Take 2 tablets (1,000 mg total) by mouth every 6 (six) hours as needed (body aches). 06/29/17    Geradine Girt, DO  albuterol (PROVENTIL HFA;VENTOLIN HFA) 108 (90 BASE) MCG/ACT inhaler Inhale 2 puffs into the lungs every 4 (four) hours as needed for wheezing or shortness of breath. 09/07/13   Leeanne Rio, MD  b complex vitamins tablet Take 1 tablet by mouth daily.    [provider]  calcium-vitamin D (OSCAL WITH D) 500-200 MG-UNIT tablet Take 2 tablets by mouth 3 (three) times daily. 05/17/16   Geradine Girt, DO  cephALEXin (KEFLEX) 500 MG capsule Take 1 capsule (500 mg total) by mouth 4 (four) times daily. 09/15/17   Blanchie Dessert, MD  clopidogrel (PLAVIX) 75 MG tablet Take 1 tablet (75 mg total) by mouth daily. 09/15/15   Florencia Reasons, MD  diphenhydrAMINE (BENADRYL) 25 MG tablet Take 25-50 mg by mouth at bedtime.     [provider]  furosemide (LASIX) 40 MG tablet Take 2 tablets (80 mg total) by mouth 2 (two) times daily. 05/17/16   Geradine Girt, DO  lansoprazole (PREVACID) 15 MG capsule Take 1 capsule (15 mg total) by mouth daily at 12 noon. 03/07/15   Little, Wenda Overland, MD  levothyroxine (SYNTHROID, LEVOTHROID) 300 MCG tablet Take 300 mcg by mouth daily before breakfast.    [provider]  lisinopril (PRINIVIL,ZESTRIL) 10 MG tablet Take 10 mg by mouth daily.  09/09/14   [provider]  metFORMIN (GLUCOPHAGE) 1000 MG tablet Take 1 tablet (1,000 mg total) by mouth 2 (two) times daily with a meal. 04/29/14   Cristal Ford, DO  metoprolol tartrate (LOPRESSOR) 100 MG tablet Take 100 mg by mouth 2 (two) times daily. 06/23/16   [provider]  Multiple Vitamins-Minerals (MULTIVITAMIN GUMMIES WOMENS) CHEW Chew 2 each by mouth daily.    [provider]  Omega-3 Fatty Acids (FISH OIL PO) Take 1 capsule by mouth daily.    [provider]  ondansetron (ZOFRAN-ODT) 8 MG disintegrating tablet Take 8 mg by mouth 3 (three) times daily as needed for nausea or vomiting.    [provider]  potassium chloride SA  (K-DUR,KLOR-CON) 20 MEQ tablet Take 40 mEq by mouth 2 (two) times daily.     [provider]  pravastatin (PRAVACHOL) 20 MG tablet Take 20 mg by mouth daily.  06/25/13   [provider]  Probiotic Product (PROBIOTIC PO) Take 1 tablet by mouth daily.    [provider]  sitaGLIPtin (JANUVIA) 100 MG tablet Take 100 mg by mouth daily.    [provider]    Family History Family History  Problem Relation Age of Onset  . Heart failure Mother   . Diabetes Mother   . Stroke Father   . Heart failure Father   . Cancer Father     Social History Social History   Tobacco Use  . Smoking status: Never Smoker  . Smokeless tobacco: Never Used  Substance Use Topics  . Alcohol use:  No  . Drug use: No    Allergies   Lorabid [loracarbef]; Augmentin [amoxicillin-pot clavulanate]; Codeine; Gadolinium derivatives; Sulfa drugs cross reactors; Benicar [olmesartan medoxomil]; and Other   Review of Systems Review of Systems  Constitutional: Positive for chills and fever (subjective).  Respiratory: Negative for shortness of breath.   Cardiovascular: Negative for chest pain.  Gastrointestinal: Positive for abdominal pain, blood in stool, diarrhea (chronic loose stools, unchanged) and nausea. Negative for vomiting.  Genitourinary: Positive for flank pain (R intermittent since procedure in May, unchanged) and urgency. Negative for dysuria, frequency, hematuria and vaginal discharge.  All other systems reviewed and are negative.  Physical Exam Updated Vital Signs BP (!) 149/81 (BP Location: Right Arm)   Pulse 97   Temp 98.8 F (37.1 C) (Oral)   Resp 18   SpO2 99%   Physical Exam  Constitutional: She appears well-developed and well-nourished.  Non-toxic appearance. No distress.  HENT:  Head: Normocephalic and atraumatic.  Eyes: Conjunctivae are normal. Right eye exhibits no discharge. Left eye exhibits no discharge.  Cardiovascular: Normal rate and regular  rhythm.  No murmur heard. Pulmonary/Chest: Breath sounds normal. No respiratory distress. She has no wheezes. She has no rales.  Abdominal: Soft. She exhibits no distension. There is tenderness in the right upper quadrant, right lower quadrant, epigastric area, periumbilical area and suprapubic area. There is CVA tenderness (R). There is no rigidity, no rebound and no guarding.  Neurological: She is alert.  Clear speech.   Skin: Skin is warm and dry. No rash noted.  Psychiatric: She has a normal mood and affect. Her behavior is normal.  Nursing note and vitals reviewed.  ED Treatments / Results  Labs Results for orders placed or performed during the hospital encounter of 09/21/17  Urinalysis, Routine w reflex microscopic  Result Value Ref Range   Color, Urine YELLOW YELLOW   APPearance CLEAR CLEAR   Specific Gravity, Urine 1.012 1.005 - 1.030   pH 6.0 5.0 - 8.0   Glucose, UA NEGATIVE NEGATIVE mg/dL   Hgb urine dipstick NEGATIVE NEGATIVE   Bilirubin Urine NEGATIVE NEGATIVE   Ketones, ur NEGATIVE NEGATIVE mg/dL   Protein, ur NEGATIVE NEGATIVE mg/dL   Nitrite NEGATIVE NEGATIVE   Leukocytes, UA NEGATIVE NEGATIVE  Comprehensive metabolic panel  Result Value Ref Range   Sodium 137 135 - 145 mmol/L   Potassium 3.6 3.5 - 5.1 mmol/L   Chloride 102 98 - 111 mmol/L   CO2 24 22 - 32 mmol/L   Glucose, Bld 236 (H) 70 - 99 mg/dL   BUN 14 6 - 20 mg/dL   Creatinine, Ser 1.10 (H) 0.44 - 1.00 mg/dL   Calcium 8.7 (L) 8.9 - 10.3 mg/dL   Total Protein 7.1 6.5 - 8.1 g/dL   Albumin 3.7 3.5 - 5.0 g/dL   AST 20 15 - 41 U/L   ALT 20 0 - 44 U/L   Alkaline Phosphatase 70 38 - 126 U/L   Total Bilirubin 0.8 0.3 - 1.2 mg/dL   GFR calc non Af Amer 55 (L) >60 mL/min   GFR calc Af Amer >60 >60 mL/min   Anion gap 11 5 - 15  Lipase, blood  Result Value Ref Range   Lipase 40 11 - 51 U/L  CBC with Differential  Result Value Ref Range   WBC 7.4 4.0 - 10.5 K/uL   RBC 4.49 3.87 - 5.11 MIL/uL   Hemoglobin  13.0 12.0 - 15.0 g/dL   HCT 40.9 36.0 - 46.0 %  MCV 91.1 78.0 - 100.0 fL   MCH 29.0 26.0 - 34.0 pg   MCHC 31.8 30.0 - 36.0 g/dL   RDW 13.3 11.5 - 15.5 %   Platelets 272 150 - 400 K/uL   Neutrophils Relative % 68 %   Neutro Abs 5.0 1.7 - 7.7 K/uL   Lymphocytes Relative 25 %   Lymphs Abs 1.8 0.7 - 4.0 K/uL   Monocytes Relative 6 %   Monocytes Absolute 0.4 0.1 - 1.0 K/uL   Eosinophils Relative 1 %   Eosinophils Absolute 0.1 0.0 - 0.7 K/uL   Basophils Relative 0 %   Basophils Absolute 0.0 0.0 - 0.1 K/uL   Immature Granulocytes 0 %   Abs Immature Granulocytes 0.0 0.0 - 0.1 K/uL   None  Radiology Ct Abdomen Pelvis W Contrast  Result Date: 09/21/2017 CLINICAL DATA:  Dysuria and right-sided abdominal pain. Diagnosed with UTI last week. History of papillary renal cell carcinoma status post cryoablation. EXAM: CT ABDOMEN AND PELVIS WITH CONTRAST TECHNIQUE: Multidetector CT imaging of the abdomen and pelvis was performed using the standard protocol following bolus administration of intravenous contrast. CONTRAST:  145mL OMNIPAQUE IOHEXOL 300 MG/ML  SOLN COMPARISON:  MRI abdomen dated June 13, 2017. CT abdomen pelvis dated April 25, 2014. FINDINGS: Lower chest: No acute abnormality. Stable 6 mm pulmonary nodule in the left lower lobe, benign. Hepatobiliary: Diffuse hepatic steatosis. No focal liver abnormality. Status post cholecystectomy. No biliary dilatation. Pancreas: Unremarkable. No pancreatic ductal dilatation or surrounding inflammatory changes. Spleen: Normal in size without focal abnormality. Adrenals/Urinary Tract: The adrenal glands are unremarkable. Postprocedural changes and scarring in the upper and midpole of the right kidney related to recent cryoablation. No definite residual enhancing lesion. Subcentimeter low-density lesions in both kidneys remain too small to characterize, but are unchanged. The left kidney is unremarkable. No renal or ureteral calculi. No hydronephrosis. The  bladder is unremarkable. Stomach/Bowel: Stomach is within normal limits. Appendix appears normal. No evidence of bowel wall thickening, distention, or inflammatory changes. Vascular/Lymphatic: Aortic atherosclerosis. No enlarged abdominal or pelvic lymph nodes. Reproductive: Status post hysterectomy. No adnexal masses. Other: No abdominal wall hernia or abnormality. No abdominopelvic ascites. No pneumoperitoneum. Musculoskeletal: No acute or significant osseous findings. Severe degenerative disc disease at L5-S1. IMPRESSION: 1.  No acute intra-abdominal process. 2. Post procedural changes and scarring in the upper and mid pole of the right kidney related to recent renal mass cyroablation. No definite residual enhancing lesions. Attention on follow-up MRI in August 2019. 3. Diffuse hepatic steatosis. 4.  Aortic atherosclerosis (ICD10-I70.0). Electronically Signed   By: Titus Dubin M.D.   On: 09/21/2017 19:06    Procedures Procedures (including critical care time)  Medications Ordered in ED Medications - No data to display   Initial Impression / Assessment and Plan / ED Course  I have reviewed the triage vital signs and the nursing notes.  Pertinent labs & imaging results that were available during my care of the patient were reviewed by me and considered in my medical decision making (see chart for details).   Patient presents to the emergency department complaining of continued abdominal discomfort.  She has had associated nausea, chills, subjective fevers, and urinary urgency.  She is nontoxic-appearing, in no apparent distress, vitals WNL with the exception of elevated blood pressure intermittently, doubt hypertensive emergency.  On exam patient is tender in the mid to right sided abdomen.  No peritoneal signs.  Labs reviewed and grossly unremarkable.  No leukocytosis.  No anemia.  No significant electrolyte abnormalities, mild hypocalcemia at 8.7.  Creatinine of 1.10 appears at baseline.   LFTs and lipase are within normal limits.  Patient's urine is completely benign, no evidence of UTI persistence, her previous urine culture report was reviewed and was sensitive to all abx, suspect successful tx with keflex she was given, culture of today's urine has been ordered and is pending.  CT abdomen pelvis was obtained and negative for acute intra-abdominal abnormalities to explain patient's discomfort.  On repeat abdominal exam patient remains without peritoneal signs, patient does not have a surgical abdomen, doubt bowel obstruction/perforation, appendicitis, diverticulitis, pancreatitis, or cholecystitis.  Unclear definitive etiology to patient's symptoms.  We will have her follow-up closely with primary care as well as her nephrologist.  Tramadol provided for pain, [facility] Controlled Substance reporting System queried. I discussed results, treatment plan, need for PCP follow-up, and return precautions with the patient. Provided opportunity for questions, patient confirmed understanding and is in agreement with plan.   Findings and plan of care discussed with supervising physician Dr. Regenia Skeeter who is in agreement.    Final Clinical Impressions(s) / ED Diagnoses   Final diagnoses:  Abdominal pain, unspecified abdominal location    ED Discharge Orders        Ordered    traMADol (ULTRAM) 50 MG tablet  Every 6 hours PRN     09/21/17 2052       Amaryllis Dyke, PA-C 09/21/17 2147    Sherwood Gambler, MD 09/22/17 [phone]

## 2017-09-21 NOTE — ED Notes (Signed)
Patient transported to CT 

## 2017-09-21 NOTE — ED Provider Notes (Signed)
Patient placed in Quick Look pathway, seen and evaluated   Chief Complaint: UTI  HPI:   Came in last week. Dx uti.d/c with keflex, still taking the medicine, no improvement in sxs. Hx of resistant uti  ROS: Epigastric pain, nausea (one)  Physical Exam:   Gen: No distress  Neuro: Awake and Alert  Skin: Warm    Focused Exam: + epigastric pain   Initiation of care has begun. The patient has been counseled on the process, plan, and necessity for staying for the completion/evaluation, and the remainder of the medical screening examination    Margarita Mail, PA-C 09/21/17 1404    Pattricia Boss, MD 09/21/17 1651

## 2017-09-21 NOTE — ED Notes (Signed)
Occult card at bedside 

## 2017-09-22 LAB — URINE CULTURE: Culture: <10000 — AB

## 2017-10-17 ENCOUNTER — Other Ambulatory Visit (HOSPITAL_COMMUNITY): Payer: Self-pay | Admitting: Interventional Radiology

## 2017-10-17 DIAGNOSIS — N2889 Other specified disorders of kidney and ureter: Secondary | ICD-10-CM

## 2017-10-17 NOTE — Telephone Encounter (Signed)
Patient states that her insurance benefits have lapsed.  She is currently trying to have insurance reinstated.  She has requested to postpone follow up until September 2019.   Jayce Kainz Riki Rusk, [place] 10/17/2017 3:35 PM

## 2017-11-10 ENCOUNTER — Other Ambulatory Visit: Payer: Self-pay | Admitting: *Deleted

## 2017-11-10 ENCOUNTER — Other Ambulatory Visit: Payer: Self-pay | Admitting: Radiology

## 2017-11-10 DIAGNOSIS — N2889 Other specified disorders of kidney and ureter: Secondary | ICD-10-CM

## 2017-11-10 MED ORDER — DIPHENHYDRAMINE HCL 50 MG PO TABS: 50.0000 mg | ORAL_TABLET | Freq: Once | ORAL | 0 refills | Status: DC

## 2017-11-10 MED ORDER — PREDNISONE 50 MG PO TABS: ORAL_TABLET | ORAL | 0 refills | Status: DC

## 2017-11-22 NOTE — Progress Notes (Signed)
13-prep called to pt's [facility] in [place] and message left on pt's VM.  She is to take Prednisone 50mg  PO 12/12/17 @ 2100, 12/13/17 @ 0300 and 0900; Benadryl 50mg  PO 12/13/17 @ 0900.  Gypsy Lore, RN

## 2017-11-29 ENCOUNTER — Other Ambulatory Visit: Payer: Self-pay

## 2017-11-29 ENCOUNTER — Ambulatory Visit (HOSPITAL_COMMUNITY): Payer: BC Managed Care – PPO

## 2017-12-13 ENCOUNTER — Ambulatory Visit (HOSPITAL_COMMUNITY)
Admission: RE | Admit: 2017-12-13 | Discharge: 2017-12-13 | Disposition: A | Discharge status: Home / Self Care | Payer: BC Managed Care – PPO | Source: Ambulatory Visit | Attending: Interventional Radiology | Admitting: Interventional Radiology

## 2017-12-13 ENCOUNTER — Ambulatory Visit
Admission: RE | Admit: 2017-12-13 | Discharge: 2017-12-13 | Disposition: A | Discharge status: Home / Self Care | Payer: BC Managed Care – PPO | Source: Ambulatory Visit | Attending: Interventional Radiology | Admitting: Interventional Radiology

## 2017-12-13 DIAGNOSIS — K760 Fatty (change of) liver, not elsewhere classified: Secondary | ICD-10-CM | POA: Insufficient documentation

## 2017-12-13 DIAGNOSIS — N2889 Other specified disorders of kidney and ureter: Secondary | ICD-10-CM

## 2017-12-13 HISTORY — PX: IR RADIOLOGIST EVAL & MGMT: IMG5224

## 2017-12-13 LAB — POCT I-STAT CREATININE: Creatinine, Ser: 1 mg/dL (ref 0.44–1.00)

## 2017-12-13 MED ORDER — GADOBUTROL 1 MMOL/ML IV SOLN
10.0000 mL | Freq: Once | INTRAVENOUS | Status: AC | PRN
  Administered 2017-12-13: 10 mL via INTRAVENOUS
  Filled 2017-12-13: qty 10

## 2017-12-13 NOTE — Progress Notes (Signed)
Referring Physician(s): Watts,John  Chief Complaint: The patient is seen in follow up today s/p right renal cryoablation 08/05/17  History of present illness: [name] is a 57 year old female with past medical history significant for DM, HTN, HLD, dysrhythmia who is s/p right renal cryoablation procedure 08/05/17.  She did undergo a repeat MRI this AM and imaging is available for review today with Dr. Pascal Lux.   Patient presents today with complaint of "lump in my throat."  She did receive pre-medication for MR this morning.  She denies cough, shortness of breath, inability to eat and drink, abdominal, pain, nausea, hives.  She also reports intermittent dysuria and bladder spasms.    Of note, she has been to the ED x3 since her procedure once for shortness of breath, a twice for painful urination. She states her last appointment with her urology was 1-2 weeks ago where she was again treated for UTI.  She does report that ultimately her urine cultures were negative.  Past Medical History:  Diagnosis Date  . Anemia    hx of   . 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 Asthma    due to allergies   . Cancer ([place])   . Complication of anesthesia   . Diabetes mellitus   . Dyspnea    increased exertion   . Dysrhythmia    " irregular heart rate per patient"  . Family history of adverse reaction to anesthesia    difficult for father to wake after surgery  . Heart murmur   . Hypercholesteremia   . Hypertension   . PONV (postoperative nausea and vomiting)   . Thyroid disease   . TIA (transient ischemic attack)     Past Surgical History:  Procedure Laterality Date  . ABDOMINAL HYSTERECTOMY    . CHOLECYSTECTOMY    . COLON SURGERY    . IR RADIOLOGIST EVAL & MGMT  01/12/2017  . IR RADIOLOGIST EVAL & MGMT  06/16/2017  . IR RADIOLOGIST EVAL & MGMT  08/31/2017  . KNEE ARTHROSCOPY    . LEFT HEART CATHETERIZATION WITH CORONARY ANGIOGRAM N/A 04/24/2014   Procedure: LEFT HEART CATHETERIZATION WITH  CORONARY ANGIOGRAM;  Surgeon: Burnell Blanks, MD;  Location: [facility] CATH LAB;  Service: Cardiovascular;  Laterality: N/A;  . RADIOFREQUENCY ABLATION Right 08/05/2017   Procedure: CT RENAL CRYO AND BIOPSY;  Surgeon: Sandi Mariscal, MD;  Location: [facility] ORS;  Service: Anesthesiology;  Laterality: Right;  . THYROIDECTOMY N/A 09/20/2014   Procedure: TOTAL THYROIDECTOMY;  Surgeon: Armandina Gemma, MD;  Location: [facility] ORS;  Service: General;  Laterality: N/A;  . TONSILLECTOMY      Allergies: Lorabid [loracarbef]; Augmentin [amoxicillin-pot clavulanate]; Codeine; Gadolinium derivatives; Sulfa drugs cross reactors; Benicar [olmesartan medoxomil]; and Other  Medications: Prior to Admission medications   Medication Sig Start Date End Date Taking? Authorizing Provider  acetaminophen (TYLENOL) 500 MG tablet Take 2 tablets (1,000 mg total) by mouth every 6 (six) hours as needed (body aches). 06/29/17   Geradine Girt, DO  albuterol (PROVENTIL HFA;VENTOLIN HFA) 108 (90 BASE) MCG/ACT inhaler Inhale 2 puffs into the lungs every 4 (four) hours as needed for wheezing or shortness of breath. 09/07/13   Leeanne Rio, MD  b complex vitamins tablet Take 1 tablet by mouth daily.    [provider]  calcium-vitamin D (OSCAL WITH D) 500-200 MG-UNIT tablet Take 2 tablets by mouth 3 (three) times daily. 05/17/16   Geradine Girt, DO  cephALEXin [facility])  500 MG capsule Take 1 capsule (500 mg total) by mouth 4 (four) times daily. 09/15/17   Blanchie Dessert, MD  clopidogrel (PLAVIX) 75 MG tablet Take 1 tablet (75 mg total) by mouth daily. 09/15/15   Florencia Reasons, MD  diphenhydrAMINE (BENADRYL) 25 MG tablet Take 25-50 mg by mouth at bedtime.     [provider]  diphenhydrAMINE (BENADRYL) 50 MG tablet Take 1 tablet (50 mg total) by mouth once for 1 dose. Take Prednisone 50 mg 13 hrs, 7 hrs & 1 hr prior to MR. 11/10/17 11/10/17  Sandi Mariscal, MD  furosemide (LASIX) 40 MG tablet Take 2 tablets (80 mg total) by mouth 2  (two) times daily. 05/17/16   Geradine Girt, DO  JANUMET 50-1000 MG tablet Take 1 tablet by mouth 2 (two) times daily. 09/16/17   [provider]  lansoprazole (PREVACID) 15 MG capsule Take 1 capsule (15 mg total) by mouth daily at 12 noon. 03/07/15   Little, Wenda Overland, MD  levothyroxine (SYNTHROID, LEVOTHROID) 300 MCG tablet Take 300 mcg by mouth daily before breakfast.    [provider]  lisinopril (PRINIVIL,ZESTRIL) 10 MG tablet Take 10 mg by mouth daily.  09/09/14   [provider]  metoprolol tartrate (LOPRESSOR) 100 MG tablet Take 100 mg by mouth 2 (two) times daily. 06/23/16   [provider]  Multiple Vitamins-Minerals (MULTIVITAMIN GUMMIES WOMENS) CHEW Chew 2 each by mouth daily.    [provider]  Omega-3 Fatty Acids (FISH OIL PO) Take 1 capsule by mouth daily.    [provider]  ondansetron (ZOFRAN-ODT) 8 MG disintegrating tablet Take 8 mg by mouth 3 (three) times daily as needed for nausea or vomiting.    [provider]  potassium chloride SA (K-DUR,KLOR-CON) 20 MEQ tablet Take 40 mEq by mouth 2 (two) times daily.     [provider]  pravastatin (PRAVACHOL) 20 MG tablet Take 20 mg by mouth daily.  06/25/13   [provider]  predniSONE (DELTASONE) 50 MG tablet Prednisone 50 mg by mouth 13 hrs, 7 hrs & 1 hr prior to MR. 11/10/17   Sandi Mariscal, MD  Probiotic Product (PROBIOTIC PO) Take 1 tablet by mouth daily.    [provider]  traMADol (ULTRAM) 50 MG tablet Take 1 tablet (50 mg total) by mouth every 6 (six) hours as needed. 09/21/17   Petrucelli, Glynda Jaeger, PA-C     Family History  Problem Relation Age of Onset  . Heart failure Mother   . Diabetes Mother   . Stroke Father   . Heart failure Father   . Cancer Father     Social History   Socioeconomic History  . Marital status: Married    Spouse name: Glendell Docker  . Number of children: 2  . Years of education: 47  . Highest education  level: Not on file  Occupational History    Comment: NA, disabled  Social Needs  . Financial resource strain: Not on file  . Food insecurity:    Worry: Not on file    Inability: Not on file  . Transportation needs:    Medical: Not on file    Non-medical: Not on file  Tobacco Use  . Smoking status: Never Smoker  . Smokeless tobacco: Never Used  Substance and Sexual Activity  . Alcohol use: No  . Drug use: No  . Sexual activity: Not on file  Lifestyle  . Physical activity:    Days per week: Not on  file    Minutes per session: Not on file  . Stress: Not on file  Relationships  . Social connections:    Talks on phone: Not on file    Gets together: Not on file    Attends religious service: Not on file    Active member of club or organization: Not on file    Attends meetings of clubs or organizations: Not on file    Relationship status: Not on file  Other Topics Concern  . Not on file  Social History Narrative   Lives with husband and son     Vital Signs: BP 135/81   Pulse 76   Temp 98.2 F (36.8 C) (Oral)   Resp 15   Ht 5' 6" (1.676 m)   Wt 233 lb (105.7 kg)   SpO2 97%   BMI 37.61 kg/m   Physical Exam  NAD, alert Chest/Lungs:  Clear to auscultation bilaterally, no wheeze.  Talking without difficulty.  Able to swallow.  Abdomen: soft, non-distended, non-tender Back/Flank:  Soft, well-healed scar from procedure site is intact without issue.  Non-tender  Imaging: Mr Abdomen Wwo Contrast  Result Date: 12/13/2017 CLINICAL DATA:  Status post cryo ablation of renal mass. EXAM: MRI ABDOMEN WITHOUT AND WITH CONTRAST TECHNIQUE: Multiplanar multisequence MR imaging of the abdomen was performed both before and after the administration of intravenous contrast. CONTRAST:  10 cc of Gadavist COMPARISON:  09/21/2017 FINDINGS: Lower chest: No acute findings. Hepatobiliary: Diffuse hepatic steatosis. No suspicious enhancing liver abnormalities. The gallbladder is surgically absent.  No biliary ductal dilatation. Pancreas: No mass, inflammatory changes, or other parenchymal abnormality identified. Spleen:  Within normal limits in size and appearance. Adrenals/Urinary Tract: Normal appearance of the adrenal glands. The cryo ablation zone defect arising from the posterior cortex of the interpolar right kidney is again identified, image 64/902. There is no abnormal enhancement to suggest residual/recurrence of tumor in this area. No perinephric fluid collection of so she aided with this ablation zone defect identified. The second ablation zone area arising from upper pole of right kidney is again noted. This measures 2.4 by 1.8 cm, image 35/4. Focal round area of enhancement is identified within this area measuring 1.6 cm, image 68/905. On the pre ablation MRI this measured 1.7 cm. Tiny T2 hyperintense structure arising from upper pole of left kidney measures 7 mm. Unchanged. No perinephric fluid collections or hydronephrosis identified bilaterally. Stomach/Bowel: Visualized portions within the abdomen are unremarkable. Vascular/Lymphatic: No pathologically enlarged lymph nodes identified. No abdominal aortic aneurysm demonstrated. Other:  None. Musculoskeletal: No suspicious bone lesions identified. IMPRESSION: 1. Status post cryo ablation within the upper pole of right kidney and posterior cortex of the interpolar right kidney. 2. There is no evidence for residual or recurrent tumor in the distribution of the treated tumor arising from the posterior cortex of the right kidney. 3. Within the ablation zone in the upper pole of right kidney there is a persistent round area of enhancement which may represent residual tumor. Recommend followup imaging to assess for any temporal change of this area. 4. No post-procedure cryoablation complications identified. No perinephric fluid collections or hydronephrosis identified. 5. Hepatic steatosis. Electronically Signed   By: Kerby Moors M.D.   On:  12/13/2017 12:33    Labs:  CBC: Recent Labs    08/05/17 1031 08/13/17 1940 09/15/17 0330 09/21/17 1405  WBC 8.4 6.6 6.9 7.4  HGB 13.7 12.7 12.9 13.0  HCT 41.3 40.1 40.4 40.9  PLT 294 303 240 272  COAGS: Recent Labs    08/05/17 1031  INR 0.99    BMP: Recent Labs    08/13/17 1940 08/29/17 0000 09/15/17 0330 09/21/17 1405 12/13/17 1053  NA 136 139 139 137  --   K 3.7 4.1 3.9 3.6  --   CL 104 105 103 102  --   CO2 _0 --   GLUCOSE 273* 199* 228* 236*  --   BUN _1 --   CALCIUM 8.3* 9.1 8.6* 8.7*  --   CREATININE 1.18* 1.12* 1.16* 1.10* 1.00  GFRNONAA 51* 55* 52* 55*  --   GFRAA 59* 64 >60 >60  --     LIVER FUNCTION TESTS: Recent Labs    01/21/17 0419 06/26/17 1911 09/21/17 1405  BILITOT 0.7 0.5 0.8  AST _2 ALT _3 ALKPHOS 70 62 70  PROT 6.7 6.6 7.1  ALBUMIN 3.6 3.6 3.7    Assessment: Right Renal Cryoablation 08/05/17 Patient presents to clinic today with several questions regarding her progress and symptoms post-procedure.   With regard to the "lump in her throat" after MR imaging today, she is able to communicate and breath without difficulty.  Her lungs are clear with no audible wheeze.  No evidence of nausea, itchy throat, rash, hives, or other signs of anaphylaxis. She has eaten without difficulty.  She did take an additional 25 mg benadryl after the scan which she feels is helping.   No further interventions needed at this time.  Patient instructed to continue to monitor symptoms and seek emergency help if she develops signs or symptoms of anaphylaxis, although unlikely given it has been several hours since her scan.   With regard to her chronic issues, patient does have a Dealer (Dr. Gloriann Loan) who she follows for UTIs and bladder spasms.  She feels as though these episodes have increased in frequency since her procedure.  Dr. Pascal Lux has met with patient to discuss her concerns as well as her imaging from this morning.    Follow-up per Dr. Pascal Lux.   Signed: Docia Barrier, PA 12/13/2017, 1:50 PM   Please refer to Dr. Pascal Lux attestation of this note for management and plan.

## 2017-12-14 ENCOUNTER — Encounter: Payer: Self-pay | Admitting: *Deleted

## 2017-12-28 ENCOUNTER — Encounter (HOSPITAL_COMMUNITY): Payer: Self-pay | Admitting: *Deleted

## 2017-12-28 ENCOUNTER — Emergency Department (HOSPITAL_COMMUNITY)
Admission: EM | Admit: 2017-12-28 | Discharge: 2017-12-28 | Disposition: A | Discharge status: Home / Self Care | Payer: BC Managed Care – PPO | Attending: Emergency Medicine | Admitting: Emergency Medicine

## 2017-12-28 ENCOUNTER — Emergency Department (HOSPITAL_COMMUNITY): Payer: BC Managed Care – PPO

## 2017-12-28 DIAGNOSIS — J45909 Unspecified asthma, uncomplicated: Secondary | ICD-10-CM | POA: Insufficient documentation

## 2017-12-28 DIAGNOSIS — E039 Hypothyroidism, unspecified: Secondary | ICD-10-CM | POA: Diagnosis not present

## 2017-12-28 DIAGNOSIS — I10 Essential (primary) hypertension: Secondary | ICD-10-CM | POA: Diagnosis not present

## 2017-12-28 DIAGNOSIS — Z79899 Other long term (current) drug therapy: Secondary | ICD-10-CM | POA: Diagnosis not present

## 2017-12-28 DIAGNOSIS — J069 Acute upper respiratory infection, unspecified: Secondary | ICD-10-CM

## 2017-12-28 DIAGNOSIS — R05 Cough: Secondary | ICD-10-CM | POA: Diagnosis present

## 2017-12-28 DIAGNOSIS — E119 Type 2 diabetes mellitus without complications: Secondary | ICD-10-CM | POA: Diagnosis not present

## 2017-12-28 LAB — CBC
HEMATOCRIT: 41.8 % (ref 36.0–46.0)
Hemoglobin: 13.3 g/dL (ref 12.0–15.0)
MCH: 29.2 pg (ref 26.0–34.0)
MCHC: 31.8 g/dL (ref 30.0–36.0)
MCV: 91.7 fL (ref 78.0–100.0)
PLATELETS: 207 10*3/uL (ref 150–400)
RBC: 4.56 MIL/uL (ref 3.87–5.11)
RDW: 13 % (ref 11.5–15.5)
WBC: 6.9 10*3/uL (ref 4.0–10.5)

## 2017-12-28 LAB — BASIC METABOLIC PANEL
ANION GAP: 14 (ref 5–15)
BUN: 21 mg/dL — ABNORMAL HIGH (ref 6–20)
CALCIUM: 8.6 mg/dL — AB (ref 8.9–10.3)
CO2: 22 mmol/L (ref 22–32)
Chloride: 101 mmol/L (ref 98–111)
Creatinine, Ser: 1.35 mg/dL — ABNORMAL HIGH (ref 0.44–1.00)
GFR calc Af Amer: 50 mL/min — ABNORMAL LOW (ref >60)
GFR calc non Af Amer: 43 mL/min — ABNORMAL LOW (ref >60)
Glucose, Bld: 237 mg/dL — ABNORMAL HIGH (ref 70–99)
Potassium: 3.8 mmol/L (ref 3.5–5.1)
Sodium: 137 mmol/L (ref 135–145)

## 2017-12-28 LAB — I-STAT TROPONIN, ED: Troponin i, poc: 0 ng/mL (ref 0.00–0.08)

## 2017-12-28 MED ORDER — FLUTICASONE PROPIONATE 50 MCG/ACT NA SUSP: 1.0000 | Freq: Every day | NASAL | 0 refills | Status: DC

## 2017-12-28 MED ORDER — IBUPROFEN 400 MG PO TABS
400.0000 mg | ORAL_TABLET | Freq: Once | ORAL | Status: AC
  Administered 2017-12-28: 400 mg via ORAL
  Filled 2017-12-28: qty 1

## 2017-12-28 MED ORDER — ALBUTEROL SULFATE HFA 108 (90 BASE) MCG/ACT IN AERS: 1.0000 | INHALATION_SPRAY | Freq: Four times a day (QID) | RESPIRATORY_TRACT | 0 refills | Status: DC | PRN

## 2017-12-28 MED ORDER — GUAIFENESIN ER 600 MG PO TB12: 600.0000 mg | ORAL_TABLET | Freq: Two times a day (BID) | ORAL | 0 refills | Status: AC

## 2017-12-28 MED ORDER — IPRATROPIUM-ALBUTEROL 0.5-2.5 (3) MG/3ML IN SOLN
3.0000 mL | Freq: Once | RESPIRATORY_TRACT | Status: AC
  Administered 2017-12-28: 3 mL via RESPIRATORY_TRACT
  Filled 2017-12-28: qty 3

## 2017-12-28 MED ORDER — LORATADINE 10 MG PO TABS
10.0000 mg | ORAL_TABLET | Freq: Once | ORAL | Status: AC
  Administered 2017-12-28: 10 mg via ORAL
  Filled 2017-12-28: qty 1

## 2017-12-28 MED ORDER — DM-GUAIFENESIN ER 30-600 MG PO TB12
1.0000 | ORAL_TABLET | Freq: Once | ORAL | Status: AC
  Administered 2017-12-28: 1 via ORAL
  Filled 2017-12-28: qty 1

## 2017-12-28 MED ORDER — LORATADINE 10 MG PO TABS: 10.0000 mg | ORAL_TABLET | Freq: Every day | ORAL | 0 refills | Status: DC

## 2017-12-28 NOTE — ED Provider Notes (Signed)
[place] EMERGENCY DEPARTMENT Provider Note   CSN: [id number] Arrival date & time: 12/28/17  0856     History   Chief Complaint Chief Complaint  Patient presents with  . Chest Pain  . Cough    HPI [name] is a 57 y.o. 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  She was in her usual state of health until 1 week ago when she began experiencing sinus congestion with right ear pain.  After onset, she took over-the-counter medications with no relief.  Yesterday morning she began experiencing chest tightness with cough productive of yellow sputum.  She further explained that the chest tightness was typically worse with the cough.  She also had some wheezing yesterday and sensation of catching her breath which has since resolved.  She endorses chills, sore throat and ongoing nasal congestion but denies rhinorrhea, nasal discharge, fevers.  She states that her to grand children recently had the flu.  Currently, she denies shortness of breath, palpitation, diaphoresis, nausea, vomiting.  HPI  Past Medical History:  Diagnosis Date  . Anemia    hx of   . 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 Asthma    due to allergies   . Cancer ([place])   . Complication of anesthesia   . Diabetes mellitus   . Dyspnea    increased exertion   . Dysrhythmia    " irregular heart rate per patient"  . Family history of adverse reaction to anesthesia    difficult for father to wake after surgery  . Heart murmur   . Hypercholesteremia   . Hypertension   . PONV (postoperative nausea and vomiting)   . Thyroid disease   . TIA (transient ischemic attack)     Patient Active Problem List   Diagnosis Date Noted  . Papillary adenocarcinoma, renal, right ([place]) 08/05/2017  . UTI (urinary tract  infection) 06/26/2017  . Urinary tract infection 01/24/2017  . Abdominal pain 01/21/2017  . Stroke-like symptoms 05/16/2016  . 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 Fatty liver disease, nonalcoholic 16/03/930  . Slurred speech 09/13/2015  . Parotitis 09/13/2015  . Gastroenteritis 08/21/2015  . Hypothyroid 08/21/2015  . 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 Pain in the chest   . Essential hypertension   . Thoracic aortic aneurysm ([place])   . Chest pain 04/23/2014  . Anginal pain ([place]) 04/23/2014  . Type 2 diabetes mellitus ([place])   . Hypertension   . Hypercholesteremia     Past Surgical History:  Procedure Laterality Date  . ABDOMINAL HYSTERECTOMY    . CHOLECYSTECTOMY    . COLON SURGERY    . IR RADIOLOGIST EVAL & MGMT  01/12/2017  . IR RADIOLOGIST EVAL & MGMT  06/16/2017  . IR RADIOLOGIST EVAL & MGMT  08/31/2017  . IR RADIOLOGIST EVAL & MGMT  12/13/2017  . KNEE ARTHROSCOPY    . LEFT HEART CATHETERIZATION WITH CORONARY ANGIOGRAM N/A 04/24/2014   Procedure: LEFT HEART CATHETERIZATION WITH CORONARY ANGIOGRAM;  Surgeon: Burnell Blanks, MD;  Location: [facility] CATH LAB;  Service: Cardiovascular;  Laterality: N/A;  . RADIOFREQUENCY ABLATION Right 08/05/2017   Procedure: CT RENAL CRYO AND BIOPSY;  Surgeon: Sandi Mariscal, MD;  Location: [facility] ORS;  Service: Anesthesiology;  Laterality: Right;  . THYROIDECTOMY N/A 09/20/2014   Procedure: TOTAL THYROIDECTOMY;  Surgeon: Armandina Gemma, MD;  Location: [facility] ORS;  Service: General;  Laterality: N/A;  . TONSILLECTOMY       OB History   None      Home Medications    Prior to Admission medications   Medication Sig Start Date End Date Taking? Authorizing Provider  acetaminophen (TYLENOL) 500 MG tablet Take 2 tablets (1,000 mg total) by mouth every 6 (six) hours as needed (body aches). 06/29/17  Yes Vann, Jessica U, DO  albuterol (PROVENTIL HFA;VENTOLIN HFA) 108 (90 BASE)  MCG/ACT inhaler Inhale 2 puffs into the lungs every 4 (four) hours as needed for wheezing or shortness of breath. 09/07/13  Yes Leeanne Rio, MD  b complex vitamins tablet Take 1 tablet by mouth daily.   Yes [provider]  calcium-vitamin D (OSCAL WITH D) 500-200 MG-UNIT tablet Take 2 tablets by mouth 3 (three) times daily. 05/17/16  Yes Geradine Girt, DO  clopidogrel (PLAVIX) 75 MG tablet Take 1 tablet (75 mg total) by mouth daily. 09/15/15  Yes Florencia Reasons, MD  diphenhydrAMINE (BENADRYL) 25 MG tablet Take 25-50 mg by mouth at bedtime.    Yes [provider]  furosemide (LASIX) 40 MG tablet Take 2 tablets (80 mg total) by mouth 2 (two) times daily. 05/17/16  Yes Vann, Jessica U, DO  JANUMET 50-1000 MG tablet Take 1 tablet by mouth 2 (two) times daily. 09/16/17  Yes [provider]  lansoprazole (PREVACID) 15 MG capsule Take 1 capsule (15 mg total) by mouth daily at 12 noon. 03/07/15  Yes Little, Wenda Overland, MD  levothyroxine (SYNTHROID, LEVOTHROID) 300 MCG tablet Take 300 mcg by mouth daily before breakfast.   Yes [provider]  lisinopril (PRINIVIL,ZESTRIL) 10 MG tablet Take 10 mg by mouth daily.  09/09/14  Yes [provider]  metoprolol tartrate (LOPRESSOR) 100 MG tablet Take 100 mg by mouth 2 (two) times daily. 06/23/16  Yes [provider]  Multiple Vitamins-Minerals (MULTIVITAMIN GUMMIES WOMENS) CHEW Chew 2 each by mouth daily.   Yes [provider]  Omega-3 Fatty Acids (FISH OIL PO) Take 1 capsule by mouth daily.   Yes [provider]  ondansetron (ZOFRAN-ODT) 8 MG disintegrating tablet Take 8 mg by mouth 3 (three) times daily as needed for nausea or vomiting.   Yes [provider]  potassium chloride SA (K-DUR,KLOR-CON) 20 MEQ tablet Take 40 mEq by mouth 2 (two) times daily.    Yes [provider]  pravastatin (PRAVACHOL) 20 MG tablet Take 20 mg by mouth daily.  06/25/13  Yes [provider]  Probiotic Product (PROBIOTIC PO) Take 1 tablet by mouth daily.   Yes [provider]  traMADol (ULTRAM) 50 MG tablet Take 1 tablet (50 mg total) by mouth every 6 (six) hours as needed. 09/21/17  Yes Petrucelli, Samantha R, PA-C  albuterol (PROVENTIL HFA;VENTOLIN HFA) 108 (90 Base) MCG/ACT inhaler Inhale 1-2 puffs into the lungs every 6 (six) hours as needed for wheezing or shortness of breath. 12/28/17   Jean Rosenthal, MD  cephALEXin (KEFLEX) 500 MG capsule Take 1 capsule (500 mg total) by mouth 4 (four) times daily. Patient not taking: Reported on 12/28/2017 09/15/17   Blanchie Dessert, MD  diphenhydrAMINE (BENADRYL) 50 MG tablet Take 1 tablet (50 mg total) by mouth once for 1 dose. Take Prednisone 50 mg 13 hrs, 7 hrs & 1 hr prior to MR. Patient not taking: Reported on 12/28/2017 11/10/17 11/10/17  Sandi Mariscal, MD  fluticasone [facility]) 50  MCG/ACT nasal spray Place 1 spray into both nostrils daily for 20 days. 12/28/17 01/17/18  Jean Rosenthal, MD  guaiFENesin (MUCINEX) 600 MG 12 hr tablet Take 1 tablet (600 mg total) by mouth 2 (two) times daily for 10 days. 12/28/17 01/07/18  Jean Rosenthal, MD  loratadine (CLARITIN) 10 MG tablet Take 1 tablet (10 mg total) by mouth daily for 10 days. 12/28/17 01/07/18  Jean Rosenthal, MD  predniSONE (DELTASONE) 50 MG tablet Prednisone 50 mg by mouth 13 hrs, 7 hrs & 1 hr prior to MR. Patient not taking: Reported on 12/28/2017 11/10/17   Sandi Mariscal, MD    Family History Family History  Problem Relation Age of Onset  . Heart failure Mother   . Diabetes Mother   . Stroke Father   . Heart failure Father   . Cancer Father     Social History Social History   Tobacco Use  . Smoking status: Never Smoker  . Smokeless tobacco: Never Used  Substance Use Topics  . Alcohol use: No  . Drug use: No     Allergies   Lorabid [loracarbef]; Augmentin [amoxicillin-pot clavulanate]; Codeine; Gadolinium derivatives; Sulfa drugs cross reactors; Benicar [olmesartan  medoxomil]; and Other   Review of Systems Review of Systems Constitutional: Positive for chills. Negative for diaphoresis and fever.  HENT: Positive for congestion, ear pain, sinus pressure, sinus pain and sore throat. Negative for ear discharge, facial swelling, hearing loss and rhinorrhea.   Eyes: Negative for pain, discharge, redness and itching.  Respiratory: Positive for cough, chest tightness and wheezing. Negative for apnea and shortness of breath.   Cardiovascular: Negative for chest pain.  Gastrointestinal: Negative.   Musculoskeletal: Negative.   Skin: Negative.   Neurological: Negative.   Psychiatric/Behavioral: Negative.    Physical Exam Updated Vital Signs BP (!) 142/90   Pulse 80   Temp 98.6 F (37 C) (Oral)   Resp (!) 22   Ht 5\' 6"  (1.676 m)   Wt 106 kg   SpO2 99%   BMI 37.72 kg/m   Physical Exam HENT:  Head: Not macrocephalic and not microcephalic. Head is without raccoon's eyes.  Right Ear: Hearing, tympanic membrane, external ear and ear canal normal. Tympanic membrane is not bulging.  Left Ear: Hearing, tympanic membrane, external ear and ear canal normal. Tympanic membrane is not bulging.  Nose: Right sinus exhibits maxillary sinus tenderness and frontal sinus tenderness. Left sinus exhibits maxillary sinus tenderness and frontal sinus tenderness.  Bilateral boggy nasal turbinates  Cardiovascular: Normal rate, regular rhythm, normal heart sounds and intact distal pulses.  Pulmonary/Chest: Effort normal and breath sounds normal. No stridor. No respiratory distress. She has no wheezes. She has no rales. She exhibits no tenderness.  Abdominal: Soft. Bowel sounds are normal.  Neurological: She is alert.  Skin: Skin is warm.  Psychiatric: She has a normal mood and affect. Her behavior is normal.   ED Treatments / Results  Labs (all labs ordered are listed, but only abnormal results are displayed) Labs Reviewed  BASIC METABOLIC PANEL - Abnormal; Notable  for the following components:      Result Value   Glucose, Bld 237 (*)    BUN 21 (*)    Creatinine, Ser 1.35 (*)    Calcium 8.6 (*)    GFR calc non Af Amer 43 (*)    GFR calc Af Amer 50 (*)    All other components within normal limits  CBC  I-STAT TROPONIN, ED  EKG EKG Interpretation  Date/Time:  Wednesday December 28 2017 09:01:50 EDT Ventricular Rate:  78 PR Interval:  172 QRS Duration: 82 QT Interval:  360 QTC Calculation: 410 R Axis:   -83 Text Interpretation:  Normal sinus rhythm Left axis deviation Low voltage QRS Septal infarct , age undetermined Possible Lateral infarct , age undetermined Abnormal ECG similar to previous Confirmed by Theotis Burrow [phone]) on 12/28/2017 10:43:14 AM   Radiology Dg Chest 2 View  Result Date: 12/28/2017 CLINICAL DATA:  Cough and congestion with chest tightness. Shortness of breath. EXAM: CHEST - 2 VIEW COMPARISON:  Chest radiograph and chest CT Aug 13, 2017 FINDINGS: Lungs are clear. Heart size and pulmonary vascularity are normal. No adenopathy. There is mild coronary artery calcifications seen radiographically, better seen on CT. No bone lesions. IMPRESSION: No edema or consolidation.  Coronary artery calcification present. Electronically Signed   By: Lowella Grip III M.D.   On: 12/28/2017 09:30    Procedures Procedures (including critical care time)  Medications Ordered in ED Medications  loratadine (CLARITIN) tablet 10 mg (10 mg Oral Given 12/28/17 1134)  dextromethorphan-guaiFENesin (MUCINEX DM) 30-600 MG per 12 hr tablet 1 tablet (1 tablet Oral Given 12/28/17 1134)  ipratropium-albuterol (DUONEB) 0.5-2.5 (3) MG/3ML nebulizer solution 3 mL (3 mLs Nebulization Given 12/28/17 1135)  ibuprofen (ADVIL,MOTRIN) tablet 400 mg (400 mg Oral Given 12/28/17 1134)     Initial Impression / Assessment and Plan / ED Course  I have reviewed the triage vital signs and the nursing notes.  Pertinent labs & imaging results that were available  during my care of the patient were reviewed by me and considered in my medical decision making (see chart for details).    Ms. Oldenburg is a 57 year old pleasant woman presented with one-week history of sinus congestion, right earache, chest tightness and wheezing.  Also with a 1 day history of cough typically productive of green sputum.  She has positive sick contacts and currently endorses sore throat, nasal congestion and right ear ache.  Physical exam noticeable boggy nasal turbinates, tenderness facial and maxillary sinuses, lung exams clear to auscultation with no obvious wheezing, crackles or rhonchi.  No evidence of hypoxia. Her ongoing complaint is most likely secondary to an upper respiratory infection with early development of bronchitis.  It is most likely not secondary to a cardiac etiology, pulmonary embolism or COPD.  Chest x-ray is unremarkable, troponin unremarkable, EKG shows sinus rhythm with no ST abnormalities.  She was given a breathing treatment with Duoneb in the Ed and will benefit from symptomatic relief with Claritin, Flonase, albuterol inhaler and Mucinex.  I will avoid antibiotics at this point as her clinical picture is most consistent with a viral etiology.   BMP was significant for slightly elevated creatinine and BUN most likely secondary to dehydration and I will encourage patient to increase oral fluid intake.  On further evaluation, she reported some relief with Claritin and Mucinex.  Final Clinical Impressions(s) / ED Diagnoses   Final diagnoses:  Viral upper respiratory tract infection    ED Discharge Orders         Ordered    fluticasone (FLONASE) 50 MCG/ACT nasal spray  Daily     12/28/17 1207    guaiFENesin (MUCINEX) 600 MG 12 hr tablet  2 times daily     12/28/17 1207    loratadine (CLARITIN) 10 MG tablet  Daily     12/28/17 1207    albuterol (PROVENTIL HFA;VENTOLIN HFA) 108 (90 Base) MCG/ACT  inhaler  Every 6 hours PRN     12/28/17 1207            Jean Rosenthal, MD 12/28/17 [place], Wenda Overland, MD 01/01/18 [phone]

## 2017-12-28 NOTE — Discharge Instructions (Addendum)
[name],   It was a pleasure taking care of you here in the Ed. Your symptoms are consistent with a viral infection which usually improves after 7-10 days. I will discharge you with an albuterol inhaler, Flonase, mucinex and Claritin. All of which should provide you with symptomatic relief.   ~Take Care

## 2017-12-28 NOTE — ED Triage Notes (Signed)
Pt in c/o cough and congestion, chest tightness that started about a week ago, cough is productive with green sputum, shortness of breath with exertion, no distress noted

## 2018-02-23 ENCOUNTER — Other Ambulatory Visit: Payer: Self-pay | Admitting: Internal Medicine

## 2018-02-28 ENCOUNTER — Other Ambulatory Visit (HOSPITAL_COMMUNITY): Payer: Self-pay | Admitting: Interventional Radiology

## 2018-02-28 DIAGNOSIS — N289 Disorder of kidney and ureter, unspecified: Secondary | ICD-10-CM

## 2018-03-03 ENCOUNTER — Encounter (HOSPITAL_COMMUNITY): Payer: Self-pay | Admitting: Emergency Medicine

## 2018-03-03 ENCOUNTER — Emergency Department (HOSPITAL_COMMUNITY)
Admission: EM | Admit: 2018-03-03 | Discharge: 2018-03-04 | Disposition: A | Discharge status: Home / Self Care | Payer: BC Managed Care – PPO | Attending: Emergency Medicine | Admitting: Emergency Medicine

## 2018-03-03 ENCOUNTER — Other Ambulatory Visit: Payer: Self-pay

## 2018-03-03 DIAGNOSIS — Z7902 Long term (current) use of antithrombotics/antiplatelets: Secondary | ICD-10-CM | POA: Insufficient documentation

## 2018-03-03 DIAGNOSIS — J45909 Unspecified asthma, uncomplicated: Secondary | ICD-10-CM | POA: Insufficient documentation

## 2018-03-03 DIAGNOSIS — I10 Essential (primary) hypertension: Secondary | ICD-10-CM | POA: Diagnosis not present

## 2018-03-03 DIAGNOSIS — Z79899 Other long term (current) drug therapy: Secondary | ICD-10-CM | POA: Diagnosis not present

## 2018-03-03 DIAGNOSIS — E119 Type 2 diabetes mellitus without complications: Secondary | ICD-10-CM | POA: Insufficient documentation

## 2018-03-03 DIAGNOSIS — N3001 Acute cystitis with hematuria: Secondary | ICD-10-CM | POA: Diagnosis not present

## 2018-03-03 DIAGNOSIS — Z7984 Long term (current) use of oral hypoglycemic drugs: Secondary | ICD-10-CM | POA: Insufficient documentation

## 2018-03-03 DIAGNOSIS — R1030 Lower abdominal pain, unspecified: Secondary | ICD-10-CM | POA: Diagnosis present

## 2018-03-03 LAB — BASIC METABOLIC PANEL
Anion gap: 13 (ref 5–15)
BUN: 16 mg/dL (ref 6–20)
CO2: 23 mmol/L (ref 22–32)
CREATININE: 1.48 mg/dL — AB (ref 0.44–1.00)
Calcium: 8.5 mg/dL — ABNORMAL LOW (ref 8.9–10.3)
Chloride: 104 mmol/L (ref 98–111)
GFR calc Af Amer: 46 mL/min — ABNORMAL LOW (ref >60)
GFR, EST NON AFRICAN AMERICAN: 39 mL/min — AB (ref >60)
Glucose, Bld: 177 mg/dL — ABNORMAL HIGH (ref 70–99)
POTASSIUM: 4.3 mmol/L (ref 3.5–5.1)
SODIUM: 140 mmol/L (ref 135–145)

## 2018-03-03 LAB — CBC
HCT: 41.6 % (ref 36.0–46.0)
Hemoglobin: 12.7 g/dL (ref 12.0–15.0)
MCH: 28.6 pg (ref 26.0–34.0)
MCHC: 30.5 g/dL (ref 30.0–36.0)
MCV: 93.7 fL (ref 80.0–100.0)
PLATELETS: 305 10*3/uL (ref 150–400)
RBC: 4.44 MIL/uL (ref 3.87–5.11)
RDW: 13.1 % (ref 11.5–15.5)
WBC: 7.8 10*3/uL (ref 4.0–10.5)
nRBC: 0 % (ref 0.0–0.2)

## 2018-03-03 MED ORDER — ONDANSETRON 4 MG PO TBDP
4.0000 mg | ORAL_TABLET | Freq: Once | ORAL | Status: AC
  Administered 2018-03-04: 4 mg via ORAL
  Filled 2018-03-03: qty 1

## 2018-03-03 MED ORDER — ACETAMINOPHEN 500 MG PO TABS
1000.0000 mg | ORAL_TABLET | Freq: Once | ORAL | Status: AC
  Administered 2018-03-04: 1000 mg via ORAL
  Filled 2018-03-03: qty 2

## 2018-03-03 NOTE — ED Triage Notes (Signed)
C/o R flank pain, pelvic pain, urinary frequency, and dysuria x 2 weeks.  Also reports nausea, dry heaves, and chills.

## 2018-03-03 NOTE — ED Notes (Signed)
Pt feels as though she is getting at UTI. Pt reports pressure in her abd and pelvic pain. Pt reports she is prone to UTI's and this feels similar to the ones she gets.

## 2018-03-03 NOTE — ED Provider Notes (Signed)
TIME SEEN: 11:07 PM  CHIEF COMPLAINT: "I think I have a urinary tract infection"  HPI: Patient is a 57 year old female with history of diabetes, hypertension, hyperlipidemia, TIA who presents to the emergency department with 2 weeks of lower abdominal pain that has progressively worsened and now with dysuria, urinary frequency, chills and nausea.  States now having some right flank pain.  She has had previous hysterectomy, cholecystectomy and "2 tumors removed from my right kidney".  No vaginal bleeding or discharge.  No history of kidney stones.  Denies vomiting or diarrhea.  ROS: See HPI Constitutional: no fever  Eyes: no drainage  ENT: no runny nose   Cardiovascular:  no chest pain  Resp: no SOB  GI: no vomiting GU:  dysuria Integumentary: no rash  Allergy: no hives  Musculoskeletal: no leg swelling  Neurological: no slurred speech ROS otherwise negative  PAST MEDICAL HISTORY/PAST SURGICAL HISTORY:  Past Medical History:  Diagnosis Date  . Anemia    hx of   . 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 Asthma    due to allergies   . Cancer ([place])   . Complication of anesthesia   . Diabetes mellitus   . Dyspnea    increased exertion   . Dysrhythmia    " irregular heart rate per patient"  . Family history of adverse reaction to anesthesia    difficult for father to wake after surgery  . Heart murmur   . Hypercholesteremia   . Hypertension   . PONV (postoperative nausea and vomiting)   . Thyroid disease   . TIA (transient ischemic attack)     MEDICATIONS:  Prior to Admission medications   Medication Sig Start Date End Date Taking? Authorizing Provider  acetaminophen (TYLENOL) 500 MG tablet Take 2 tablets (1,000 mg total) by mouth every 6 (six) hours as needed (body aches). 06/29/17   Geradine Girt, DO  albuterol (PROVENTIL HFA;VENTOLIN HFA) 108 (90 BASE) MCG/ACT inhaler Inhale 2 puffs into the lungs every 4 (four) hours as needed for wheezing or shortness of breath. 09/07/13    Leeanne Rio, MD  albuterol (PROVENTIL HFA;VENTOLIN HFA) 108 (90 Base) MCG/ACT inhaler Inhale 1-2 puffs into the lungs every 6 (six) hours as needed for wheezing or shortness of breath. 12/28/17   Jean Rosenthal, MD  b complex vitamins tablet Take 1 tablet by mouth daily.    [provider]  calcium-vitamin D (OSCAL WITH D) 500-200 MG-UNIT tablet Take 2 tablets by mouth 3 (three) times daily. 05/17/16   Geradine Girt, DO  cephALEXin (KEFLEX) 500 MG capsule Take 1 capsule (500 mg total) by mouth 4 (four) times daily. Patient not taking: Reported on 12/28/2017 09/15/17   Blanchie Dessert, MD  clopidogrel (PLAVIX) 75 MG tablet Take 1 tablet (75 mg total) by mouth daily. 09/15/15   Florencia Reasons, MD  diphenhydrAMINE (BENADRYL) 25 MG tablet Take 25-50 mg by mouth at bedtime.     [provider]  diphenhydrAMINE (BENADRYL) 50 MG tablet Take 1 tablet (50 mg total) by mouth once for 1 dose. Take Prednisone 50 mg 13 hrs, 7 hrs & 1 hr prior to MR. Patient not taking: Reported on 12/28/2017 11/10/17 11/10/17  Sandi Mariscal, MD  fluticasone [facility]) 50 MCG/ACT nasal spray Place 1 spray into both nostrils daily for 20 days. 12/28/17 01/17/18  Jean Rosenthal, MD  furosemide (LASIX) 40 MG tablet Take 2 tablets (80 mg total) by mouth 2 (two) times daily.  05/17/16   Geradine Girt, DO  JANUMET 50-1000 MG tablet Take 1 tablet by mouth 2 (two) times daily. 09/16/17   [provider]  lansoprazole (PREVACID) 15 MG capsule Take 1 capsule (15 mg total) by mouth daily at 12 noon. 03/07/15   Little, Wenda Overland, MD  levothyroxine (SYNTHROID, LEVOTHROID) 300 MCG tablet Take 300 mcg by mouth daily before breakfast.    [provider]  lisinopril (PRINIVIL,ZESTRIL) 10 MG tablet Take 10 mg by mouth daily.  09/09/14   [provider]  loratadine (CLARITIN) 10 MG tablet Take 1 tablet (10 mg total) by mouth daily for 10 days. 12/28/17 01/07/18  Jean Rosenthal, MD  metoprolol tartrate  (LOPRESSOR) 100 MG tablet Take 100 mg by mouth 2 (two) times daily. 06/23/16   [provider]  Multiple Vitamins-Minerals (MULTIVITAMIN GUMMIES WOMENS) CHEW Chew 2 each by mouth daily.    [provider]  Omega-3 Fatty Acids (FISH OIL PO) Take 1 capsule by mouth daily.    [provider]  ondansetron (ZOFRAN-ODT) 8 MG disintegrating tablet Take 8 mg by mouth 3 (three) times daily as needed for nausea or vomiting.    [provider]  potassium chloride SA (K-DUR,KLOR-CON) 20 MEQ tablet Take 40 mEq by mouth 2 (two) times daily.     [provider]  pravastatin (PRAVACHOL) 20 MG tablet Take 20 mg by mouth daily.  06/25/13   [provider]  predniSONE (DELTASONE) 50 MG tablet Prednisone 50 mg by mouth 13 hrs, 7 hrs & 1 hr prior to MR. Patient not taking: Reported on 12/28/2017 11/10/17   Sandi Mariscal, MD  Probiotic Product (PROBIOTIC PO) Take 1 tablet by mouth daily.    [provider]  traMADol (ULTRAM) 50 MG tablet Take 1 tablet (50 mg total) by mouth every 6 (six) hours as needed. 09/21/17   Petrucelli, Aldona Bar R, PA-C    ALLERGIES:  Allergies  Allergen Reactions  . Lorabid [Loracarbef] Anaphylaxis  . Augmentin [Amoxicillin-Pot Clavulanate] Hives, Nausea And Vomiting and Other (See Comments)    Chest pain  . Codeine Nausea And Vomiting  . Gadolinium Derivatives Itching and Swelling    Patient will require 13 hr prep prior to administration of gadolinium    . Sulfa Drugs Cross Reactors Other (See Comments)    Severe allergic reaction as a child - was not told symptoms  . Benicar [Olmesartan Medoxomil] Swelling and Rash  . Other Rash    Green peas    SOCIAL HISTORY:  Social History   Tobacco Use  . Smoking status: Never Smoker  . Smokeless tobacco: Never Used  Substance Use Topics  . Alcohol use: No    FAMILY HISTORY: Family History  Problem Relation Age of Onset  . Heart failure Mother   . Diabetes Mother   . Stroke  Father   . Heart failure Father   . Cancer Father     EXAM: BP 132/90   Pulse 70   Temp 98.6 F (37 C) (Oral)   Resp 16   SpO2 97%  CONSTITUTIONAL: Alert and oriented and responds appropriately to questions. Well-appearing; well-nourished, obese, afebrile, nontoxic HEAD: Normocephalic EYES: Conjunctivae clear, pupils appear equal, EOMI ENT: normal nose; moist mucous membranes NECK: Supple, no meningismus, no nuchal rigidity, no LAD  CARD: RRR; S1 and S2 appreciated; no murmurs, no clicks, no rubs, no gallops RESP: Normal chest excursion without splinting or tachypnea; breath sounds clear and equal bilaterally; no wheezes, no rhonchi, no  rales, no hypoxia or respiratory distress, speaking full sentences ABD/GI: Normal bowel sounds; non-distended; soft, non-tender, no rebound, no guarding, no peritoneal signs, no hepatosplenomegaly BACK:  The back appears normal and is non-tender to palpation, there is no CVA tenderness EXT: Normal ROM in all joints; non-tender to palpation; no edema; normal capillary refill; no cyanosis, no calf tenderness or swelling    SKIN: Normal color for age and race; warm; no rash NEURO: Moves all extremities equally PSYCH: The patient's mood and manner are appropriate. Grooming and personal hygiene are appropriate.  MEDICAL DECISION MAKING: Patient here with symptoms of UTI, possible pyelonephritis.  Abdominal exam benign.  Doubt appendicitis, colitis, diverticulitis.  She appears very comfortable.  Kidney stone on the differential but less likely given no significant distress.  Labs here are unremarkable other than mild elevation of her creatinine which she has had previously.  Urine pending.  Will treat symptomatically with Tylenol and Zofran.  ED PROGRESS: Patient does appear to have a urinary tract infection.  Culture is pending.  She has grown Staphylococcus ludgenensis, Klebsiella and E. coli in the past.  E. coli has been resistant to multiple antibiotics.   She is allergic to penicillins and sulfa.  Discussed with pharmacist who recommend dose of fosfomycin here and then discharged home on Keflex.  Patient comfortable with this plan.  Discussed return precautions.   At this time, I do not feel there is any life-threatening condition present. I have reviewed and discussed all results (EKG, imaging, lab, urine as appropriate) and exam findings with patient/family. I have reviewed nursing notes and appropriate previous records.  I feel the patient is safe to be discharged home without further emergent workup and can continue workup as an outpatient as needed. Discussed usual and customary return precautions. Patient/family verbalize understanding and are comfortable with this plan.  Outpatient follow-up has been provided as needed. All questions have been answered.      Kendrik Mcshan, Delice Bison, DO 03/04/18 [phone]

## 2018-03-04 LAB — URINALYSIS, ROUTINE W REFLEX MICROSCOPIC
BILIRUBIN URINE: NEGATIVE
Glucose, UA: NEGATIVE mg/dL
KETONES UR: NEGATIVE mg/dL
Nitrite: NEGATIVE
PH: 5 (ref 5.0–8.0)
Protein, ur: 30 mg/dL — AB
SPECIFIC GRAVITY, URINE: 1.012 (ref 1.005–1.030)

## 2018-03-04 MED ORDER — CEPHALEXIN 500 MG PO CAPS: 500.0000 mg | ORAL_CAPSULE | Freq: Three times a day (TID) | ORAL | 0 refills | Status: DC

## 2018-03-04 MED ORDER — FOSFOMYCIN TROMETHAMINE 3 G PO PACK
3.0000 g | PACK | Freq: Once | ORAL | Status: AC
  Administered 2018-03-04: 3 g via ORAL
  Filled 2018-03-04: qty 3

## 2018-03-04 MED ORDER — ONDANSETRON 4 MG PO TBDP: 4.0000 mg | ORAL_TABLET | Freq: Four times a day (QID) | ORAL | 0 refills | Status: DC | PRN

## 2018-03-04 NOTE — ED Notes (Signed)
Patient verbalizes understanding of discharge instructions. Opportunity for questioning and answers were provided. Armband removed by staff, pt discharged from ED home via POV with family. 

## 2018-03-04 NOTE — Discharge Instructions (Signed)
You may take Tylenol 1000 mg every 6 hours as needed for pain. °

## 2018-03-06 LAB — URINE CULTURE: Culture: >=100000 — AB

## 2018-03-07 ENCOUNTER — Telehealth: Payer: Self-pay | Admitting: Emergency Medicine

## 2018-03-07 NOTE — Telephone Encounter (Signed)
Post ED Visit - Positive Culture Follow-up  Culture report reviewed by antimicrobial stewardship pharmacist:  []  Elenor Quinones, Pharm.D. []  Heide Guile, Pharm.D., BCPS AQ-ID []  Parks Neptune, Pharm.D., BCPS []  Alycia Rossetti, Pharm.D., BCPS []  [place], Pharm.D., BCPS, AAHIVP []  Legrand Como, Pharm.D., BCPS, AAHIVP [x]  Salome Arnt, PharmD, BCPS []  Johnnette Gourd, PharmD, BCPS []  Hughes Better, PharmD, BCPS []  Leeroy Cha, PharmD  Positive urine culture Treated with cephalexin, organism sensitive to the same and no further patient follow-up is required at this time.  Hazle Nordmann 03/07/2018, 11:15 AM

## 2018-03-13 ENCOUNTER — Other Ambulatory Visit (HOSPITAL_COMMUNITY): Payer: Self-pay | Admitting: Interventional Radiology

## 2018-03-15 ENCOUNTER — Other Ambulatory Visit: Payer: Self-pay

## 2018-03-15 ENCOUNTER — Ambulatory Visit (HOSPITAL_COMMUNITY)
Admission: RE | Admit: 2018-03-15 | Discharge: 2018-03-15 | Disposition: A | Discharge status: Home / Self Care | Payer: BC Managed Care – PPO | Source: Ambulatory Visit | Attending: Interventional Radiology | Admitting: Interventional Radiology

## 2018-03-15 DIAGNOSIS — N289 Disorder of kidney and ureter, unspecified: Secondary | ICD-10-CM | POA: Diagnosis not present

## 2018-03-15 MED ORDER — SODIUM CHLORIDE (PF) 0.9 % IJ SOLN
INTRAMUSCULAR | Status: AC
  Filled 2018-03-15: qty 50

## 2018-03-15 MED ORDER — IOHEXOL 300 MG/ML  SOLN
100.0000 mL | Freq: Once | INTRAMUSCULAR | Status: AC | PRN
  Administered 2018-03-15: 80 mL via INTRAVENOUS

## 2018-03-16 ENCOUNTER — Ambulatory Visit
Admission: RE | Admit: 2018-03-16 | Discharge: 2018-03-16 | Disposition: A | Discharge status: Home / Self Care | Payer: BC Managed Care – PPO | Source: Ambulatory Visit | Attending: Interventional Radiology | Admitting: Interventional Radiology

## 2018-03-16 ENCOUNTER — Encounter: Payer: Self-pay | Admitting: Radiology

## 2018-03-16 DIAGNOSIS — N289 Disorder of kidney and ureter, unspecified: Secondary | ICD-10-CM

## 2018-03-16 HISTORY — PX: IR RADIOLOGIST EVAL & MGMT: IMG5224

## 2018-03-16 NOTE — Progress Notes (Signed)
Patient ID: [name], female   DOB: [birth year], 57 y.o.   MRN: [id number]         Patient was seen in consultation today for  Chief Complaint  Patient presents with  . Follow-up    7 mo follow up Cryoablation of Right Renal Mass   at the request of Kyden Potash  Referring Physician(s): Bell (Urology)  History of Present Illness: [name] is a 57 y.o. female with past medical history significant for diabetes, hypertension, hyperlipidemia, thyroid cancer who underwent S for image guided right renal cryoablation on 08/05/2017.  She returns today following acquisition of surveillance abdominal CT performed day prior.  The patient is accompanied by her husband though serves as her own historian.  Patient is without specific complaint and remains in her baseline somewhat dysmorphic state.  Specifically, no flank pain, hematuria, fever or chills.    Past Medical History:  Diagnosis Date  . Anemia    hx of   . 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 Asthma    due to allergies   . Cancer ([place])   . Complication of anesthesia   . Diabetes mellitus   . Dyspnea    increased exertion   . Dysrhythmia    " irregular heart rate per patient"  . Family history of adverse reaction to anesthesia    difficult for father to wake after surgery  . Heart murmur   . Hypercholesteremia   . Hypertension   . PONV (postoperative nausea and vomiting)   . Thyroid disease   . TIA (transient ischemic attack)     Past Surgical History:  Procedure Laterality Date  . ABDOMINAL HYSTERECTOMY    . CHOLECYSTECTOMY    . COLON SURGERY    . IR RADIOLOGIST EVAL & MGMT  01/12/2017  . IR RADIOLOGIST EVAL & MGMT  06/16/2017  . IR RADIOLOGIST EVAL & MGMT  08/31/2017  . IR RADIOLOGIST EVAL & MGMT  12/13/2017  . IR RADIOLOGIST EVAL & MGMT  03/16/2018  . KNEE ARTHROSCOPY    . LEFT HEART CATHETERIZATION WITH CORONARY ANGIOGRAM N/A 04/24/2014   Procedure: LEFT HEART CATHETERIZATION WITH CORONARY ANGIOGRAM;  Surgeon:  Burnell Blanks, MD;  Location: [facility] CATH LAB;  Service: Cardiovascular;  Laterality: N/A;  . RADIOFREQUENCY ABLATION Right 08/05/2017   Procedure: CT RENAL CRYO AND BIOPSY;  Surgeon: Sandi Mariscal, MD;  Location: [facility] ORS;  Service: Anesthesiology;  Laterality: Right;  . THYROIDECTOMY N/A 09/20/2014   Procedure: TOTAL THYROIDECTOMY;  Surgeon: Armandina Gemma, MD;  Location: [facility] ORS;  Service: General;  Laterality: N/A;  . TONSILLECTOMY      Allergies: Lorabid [loracarbef]; Augmentin [amoxicillin-pot clavulanate]; Codeine; Gadolinium derivatives; Sulfa drugs cross reactors; Benicar [olmesartan medoxomil]; and Other  Medications: Prior to Admission medications   Medication Sig Start Date End Date Taking? Authorizing Provider  acetaminophen (TYLENOL) 500 MG tablet Take 2 tablets (1,000 mg total) by mouth every 6 (six) hours as needed (body aches). 06/29/17  Yes Vann, Jessica U, DO  albuterol (PROVENTIL HFA;VENTOLIN HFA) 108 (90 BASE) MCG/ACT inhaler Inhale 2 puffs into the lungs every 4 (four) hours as needed for wheezing or shortness of breath. 09/07/13  Yes Leeanne Rio, MD  albuterol (PROVENTIL HFA;VENTOLIN HFA) 108 (90 Base) MCG/ACT inhaler Inhale 1-2 puffs into the lungs every 6 (six) hours as needed for wheezing or shortness of breath. 12/28/17  Yes Jean Rosenthal, MD  b complex vitamins tablet Take 1 tablet by mouth daily.  Yes [provider]  calcium-vitamin D (OSCAL WITH D) 500-200 MG-UNIT tablet Take 2 tablets by mouth 3 (three) times daily. 05/17/16  Yes Geradine Girt, DO  clopidogrel (PLAVIX) 75 MG tablet Take 1 tablet (75 mg total) by mouth daily. 09/15/15  Yes Florencia Reasons, MD  diphenhydrAMINE (BENADRYL) 25 MG tablet Take 25-50 mg by mouth at bedtime.    Yes [provider]  furosemide (LASIX) 40 MG tablet Take 2 tablets (80 mg total) by mouth 2 (two) times daily. 05/17/16  Yes Vann, Jessica U, DO  JANUMET 50-1000 MG tablet Take 1 tablet by mouth 2 (two) times daily.  09/16/17  Yes [provider]  lansoprazole (PREVACID) 15 MG capsule Take 1 capsule (15 mg total) by mouth daily at 12 noon. 03/07/15  Yes Little, Wenda Overland, MD  levothyroxine (SYNTHROID, LEVOTHROID) 300 MCG tablet Take 300 mcg by mouth daily before breakfast.   Yes [provider]  lisinopril (PRINIVIL,ZESTRIL) 10 MG tablet Take 10 mg by mouth daily.  09/09/14  Yes [provider]  metoprolol tartrate (LOPRESSOR) 100 MG tablet Take 100 mg by mouth 2 (two) times daily. 06/23/16  Yes [provider]  Multiple Vitamins-Minerals (MULTIVITAMIN GUMMIES WOMENS) CHEW Chew 2 each by mouth daily.   Yes [provider]  Omega-3 Fatty Acids (FISH OIL PO) Take 1 capsule by mouth daily.   Yes [provider]  ondansetron (ZOFRAN ODT) 4 MG disintegrating tablet Take 1 tablet (4 mg total) by mouth every 6 (six) hours as needed. 03/04/18  Yes Ward, Delice Bison, DO  potassium chloride SA (K-DUR,KLOR-CON) 20 MEQ tablet Take 40 mEq by mouth 2 (two) times daily.    Yes [provider]  pravastatin (PRAVACHOL) 20 MG tablet Take 20 mg by mouth daily.  06/25/13  Yes [provider]  predniSONE (DELTASONE) 50 MG tablet Prednisone 50 mg by mouth 13 hrs, 7 hrs & 1 hr prior to MR. 11/10/17  Yes Sandi Mariscal, MD  Probiotic Product (PROBIOTIC PO) Take 1 tablet by mouth daily.   Yes [provider]  diphenhydrAMINE (BENADRYL) 50 MG tablet Take 1 tablet (50 mg total) by mouth once for 1 dose. Take Prednisone 50 mg 13 hrs, 7 hrs & 1 hr prior to MR. Patient not taking: Reported on 12/28/2017 11/10/17 11/10/17  Sandi Mariscal, MD  fluticasone [facility]) 50 MCG/ACT nasal spray Place 1 spray into both nostrils daily for 20 days. Patient not taking: Reported on 03/03/2018 12/28/17 01/17/18  Jean Rosenthal, MD  loratadine (CLARITIN) 10 MG tablet Take 1 tablet (10 mg total) by mouth daily for 10 days. Patient not taking: Reported on 03/03/2018 12/28/17 01/07/18  Jean Rosenthal, MD  traMADol (ULTRAM) 50 MG tablet Take 1 tablet (50 mg total) by mouth every 6 (six) hours as needed. Patient taking differently: Take 50 mg by mouth every 6 (six) hours as needed for moderate pain.  09/21/17   Petrucelli, Glynda Jaeger, PA-C     Family History  Problem Relation Age of Onset  . Heart failure Mother   . Diabetes Mother   . Stroke Father   . Heart failure Father   . Cancer Father     Social History   Socioeconomic History  . Marital status: Married    Spouse name: Glendell Docker  . Number of children: 2  . Years of education: 45  . Highest education level: Not on file  Occupational History    Comment: NA, disabled  Social Needs  .  Financial resource strain: Not on file  . Food insecurity:    Worry: Not on file    Inability: Not on file  . Transportation needs:    Medical: Not on file    Non-medical: Not on file  Tobacco Use  . Smoking status: Never Smoker  . Smokeless tobacco: Never Used  Substance and Sexual Activity  . Alcohol use: No  . Drug use: No  . Sexual activity: Not on file  Lifestyle  . Physical activity:    Days per week: Not on file    Minutes per session: Not on file  . Stress: Not on file  Relationships  . Social connections:    Talks on phone: Not on file    Gets together: Not on file    Attends religious service: Not on file    Active member of club or organization: Not on file    Attends meetings of clubs or organizations: Not on file    Relationship status: Not on file  Other Topics Concern  . Not on file  Social History Narrative   Lives with husband and son    ECOG Status: 0 - Asymptomatic  Review of Systems: A 12 point ROS discussed and pertinent positives are indicated in the HPI above.  All other systems are negative.  Review of Systems  Constitutional: Negative for activity change, fever and unexpected weight change.  Genitourinary: Negative for flank pain and hematuria.    Vital Signs: BP (!) 137/91   Pulse 73    Temp 98.4 F (36.9 C) (Oral)   Resp 18   Ht 5\' 7"  (1.702 m)   Wt 101.6 kg   SpO2 98%   BMI 35.08 kg/m   Physical Exam   Imaging:  Selected images from preprocedural abdominal MRI performed 06/13/2017; intraprocedural images during image guided right-sided renal cryoablation performed 08/05/2017; CT abdomen and pelvis performed 09/21/2017; abdominal MRI performed 12/13/2017 and most recent surveillance renal CT performed 03/15/2018 were reviewed in detail with the patient and the patient's husband.   Ct Abdomen W Wo Contrast  Result Date: 03/16/2018 CLINICAL DATA:  Status post renal cryoablation. EXAM: CT ABDOMEN WITHOUT AND WITH CONTRAST TECHNIQUE: Multidetector CT imaging of the abdomen was performed following the standard protocol before and following the bolus administration of intravenous contrast. CONTRAST:  64mL OMNIPAQUE IOHEXOL 300 MG/ML  SOLN COMPARISON:  MRI 12/13/2017. CT scan 09/21/2017. FINDINGS: Lower chest: 6 mm left lower lobe pulmonary nodule is stable since 09/21/2017. Hepatobiliary: The liver shows diffusely decreased attenuation suggesting steatosis. No focal abnormality within the liver parenchyma. Gallbladder surgically absent. No intrahepatic or extrahepatic biliary dilation. Pancreas: No focal mass lesion. No dilatation of the main duct. No intraparenchymal cyst. No peripancreatic edema. Spleen: No splenomegaly. No focal mass lesion. Adrenals/Urinary Tract: No adrenal nodule or mass. Similar appearance of the cryoablation scar at the upper pole right kidney on axial imaging (45/11). This measures 2.0 x 1.7 cm today compared to 2.4 x 1.8 cm on 12/13/2017. Axial imaging also shows a subtle nodular focus of enhancing tissue along the medial aspect of the treatment bed (47/6). Reviewing this treatment area on coronal imaging demonstrates the nodular component along the medial aspect of the ablation bed (57/7) and raises the question that it may enhance to a slightly greater degree  than the adjacent renal cortex. This same area appears minimally washed out compared to renal cortex on the next phase (58/12). Interval retraction of the ablation defect in the posterior interpolar right kidney (  55/6) with no suspicious enhancement or soft tissue component today. Tiny subcapsular low-density lesion in the posterior lower interpolar right kidney (64/6) measures 6 mm today and is unchanged. 9 mm probable cyst noted upper pole left kidney (38/11). Stomach/Bowel: Tiny hiatal hernia. Stomach otherwise unremarkable. Duodenum is normally positioned as is the ligament of Treitz. No small bowel or colonic dilatation within the visualized abdomen. Vascular/Lymphatic: There is abdominal aortic atherosclerosis without aneurysm. There is no gastrohepatic or hepatoduodenal ligament lymphadenopathy. No intraperitoneal or retroperitoneal lymphadenopathy. Other: No intraperitoneal free fluid. Musculoskeletal: No worrisome lytic or sclerotic osseous abnormality. IMPRESSION: 1. Nodular component of enhancing tissue is identified in the medial aspect of the right upper pole ablation defect. This had a similar appearance on the previous MRI of 12/13/2017 (see image 55 of early postcontrast series 10902). Interval stability is reassuring and this is potentially related to distortion of normal renal cortex from therapy, but close attention on follow-up will be required as local recurrence can not be excluded. 2. Interval decrease in prominence of the posterior interpolar right treatment bed consistent with involution of scarring. No findings to suggest local recurrence. 3. No evidence for metastatic disease in the abdomen. 4. Tiny low-density lesions in each kidney are too small to characterize but stable since prior imaging. Continued attention on follow-up recommended. 5. Stable 6 mm left lower lobe pulmonary nodule. Electronically Signed   By: Misty Stanley M.D.   On: 03/16/2018 09:06   Ir Radiologist Eval &  Mgmt  Result Date: 03/16/2018 Please refer to notes tab for details about interventional procedure. (Op Note)   Labs:  CBC: Recent Labs    09/15/17 0330 09/21/17 1405 12/28/17 0907 03/03/18 2145  WBC 6.9 7.4 6.9 7.8  HGB 12.9 13.0 13.3 12.7  HCT 40.4 40.9 41.8 41.6  PLT 240 272 207 305    COAGS: Recent Labs    08/05/17 1031  INR 0.99    BMP: Recent Labs    09/15/17 0330 09/21/17 1405 12/13/17 1053 12/28/17 0907 03/03/18 2145  NA 139 137  --  137 140  K 3.9 3.6  --  3.8 4.3  CL 103 102  --  101 104  CO2 24 24  --  22 23  GLUCOSE 228* 236*  --  237* 177*  BUN 18 14  --  21* 16  CALCIUM 8.6* 8.7*  --  8.6* 8.5*  CREATININE 1.16* 1.10* 1.00 1.35* 1.48*  GFRNONAA 52* 55*  --  43* 39*  GFRAA >60 >60  --  50* 46*    LIVER FUNCTION TESTS: Recent Labs    06/26/17 1911 09/21/17 1405  BILITOT 0.5 0.8  AST 22 20  ALT 20 20  ALKPHOS 62 70  PROT 6.6 7.1  ALBUMIN 3.6 3.7    TUMOR MARKERS: No results for input(s): AFPTM, CEA, CA199, CHROMGRNA in the last 8760 hours.  Assessment and Plan:  Sylver Vantassell is a 57 y.o. female with past medical history significant for diabetes, hypertension, hyperlipidemia, thyroid cancer who underwent S for image guided right renal cryoablation on 08/05/2017.  She returns today following acquisition of surveillance abdominal CT performed day prior.    Patient is without specific complaint.  Selected images from preprocedural abdominal MRI performed 06/13/2017; intraprocedural images during image guided right-sided renal cryoablation performed 08/05/2017; CT abdomen and pelvis performed 09/21/2017; abdominal MRI performed 12/13/2017 and most recent surveillance renal CT performed 03/15/2018 were reviewed in detail with the patient and the patient's husband.  Most recent CT scan  demonstrates ill-defined nodular enhancing tissue about the inferior medial aspect of the superior pole ablation site measuring approximately 1.6 x 1.1 cm  (coronal image 57, series 7), grossly unchanged compared to abdominal MRI performed 9/17 to 2019.  Stable appearance of ablation site within the posterior interpolar aspect of the right kidney without evidence of recurrence.  I explained to the patient and the patient's husband that the nodular tissue could represent interposed normal renal parenchyma and/or scar though locally recurrent disease could have a similar appearance.  Potential treatment options were discussed at length with the patient and the patient's husband including the following: - Proceeding with repeat image guided renal cryoablation. - Continued surveillance with repeat CT scan performed in 6 months (June 2020).  Following this prolonged and detailed conversation, the patient wishes to pursue continued conservative management at this time.  I think as the imaging findings remain indeterminate and as the questionable area is unchanged for the past 3 months, this is a very reasonable decision.  Plan: - Patient will return to the clinic with surveillance renal protocol CT scan in 6 months (June 2020).  (Note, patient will be subsequently followed with CT imaging as she experienced a questionable contrast reaction following her most recent abdominal MRI.)   Patient knows to call the interventional radiology clinic with any interval questions or concerns   Thank you for this interesting consult.  I greatly enjoyed meeting [name] and look forward to participating in their care.  A copy of this report was sent to the requesting provider on this date.  Electronically Signed: Sandi Mariscal 03/16/2018, 3:14 PM   I spent a total of 25 Minutes in face to face in clinical consultation, greater than 50% of which was counseling/coordinating care for post right sided cryoablation.

## 2018-04-29 ENCOUNTER — Emergency Department (HOSPITAL_COMMUNITY): Payer: BC Managed Care – PPO

## 2018-04-29 ENCOUNTER — Inpatient Hospital Stay (HOSPITAL_COMMUNITY)
Admission: EM | Admit: 2018-04-29 | Discharge: 2018-04-30 | DRG: 069 | Disposition: A | Discharge status: Home / Self Care | Payer: BC Managed Care – PPO | Attending: Family Medicine | Admitting: Family Medicine

## 2018-04-29 DIAGNOSIS — Z833 Family history of diabetes mellitus: Secondary | ICD-10-CM

## 2018-04-29 DIAGNOSIS — Z90710 Acquired absence of both cervix and uterus: Secondary | ICD-10-CM | POA: Diagnosis not present

## 2018-04-29 DIAGNOSIS — R2990 Unspecified symptoms and signs involving the nervous system: Secondary | ICD-10-CM | POA: Diagnosis present

## 2018-04-29 DIAGNOSIS — R011 Cardiac murmur, unspecified: Secondary | ICD-10-CM | POA: Diagnosis present

## 2018-04-29 DIAGNOSIS — Z823 Family history of stroke: Secondary | ICD-10-CM | POA: Diagnosis not present

## 2018-04-29 DIAGNOSIS — Z885 Allergy status to narcotic agent status: Secondary | ICD-10-CM | POA: Diagnosis not present

## 2018-04-29 DIAGNOSIS — G43109 Migraine with aura, not intractable, without status migrainosus: Secondary | ICD-10-CM | POA: Diagnosis present

## 2018-04-29 DIAGNOSIS — Z79890 Hormone replacement therapy (postmenopausal): Secondary | ICD-10-CM | POA: Diagnosis not present

## 2018-04-29 DIAGNOSIS — R4781 Slurred speech: Secondary | ICD-10-CM | POA: Diagnosis present

## 2018-04-29 DIAGNOSIS — Z7952 Long term (current) use of systemic steroids: Secondary | ICD-10-CM

## 2018-04-29 DIAGNOSIS — Z882 Allergy status to sulfonamides status: Secondary | ICD-10-CM | POA: Diagnosis not present

## 2018-04-29 DIAGNOSIS — Z8249 Family history of ischemic heart disease and other diseases of the circulatory system: Secondary | ICD-10-CM

## 2018-04-29 DIAGNOSIS — Z888 Allergy status to other drugs, medicaments and biological substances status: Secondary | ICD-10-CM

## 2018-04-29 DIAGNOSIS — Z9049 Acquired absence of other specified parts of digestive tract: Secondary | ICD-10-CM | POA: Diagnosis not present

## 2018-04-29 DIAGNOSIS — Z809 Family history of malignant neoplasm, unspecified: Secondary | ICD-10-CM

## 2018-04-29 DIAGNOSIS — E7800 Pure hypercholesterolemia, unspecified: Secondary | ICD-10-CM | POA: Diagnosis present

## 2018-04-29 DIAGNOSIS — E119 Type 2 diabetes mellitus without complications: Secondary | ICD-10-CM

## 2018-04-29 DIAGNOSIS — E1151 Type 2 diabetes mellitus with diabetic peripheral angiopathy without gangrene: Secondary | ICD-10-CM | POA: Diagnosis present

## 2018-04-29 DIAGNOSIS — M19079 Primary osteoarthritis, unspecified ankle and foot: Secondary | ICD-10-CM | POA: Diagnosis present

## 2018-04-29 DIAGNOSIS — G459 Transient cerebral ischemic attack, unspecified: Secondary | ICD-10-CM

## 2018-04-29 DIAGNOSIS — G8194 Hemiplegia, unspecified affecting left nondominant side: Secondary | ICD-10-CM | POA: Diagnosis present

## 2018-04-29 DIAGNOSIS — Z8673 Personal history of transient ischemic attack (TIA), and cerebral infarction without residual deficits: Secondary | ICD-10-CM | POA: Diagnosis not present

## 2018-04-29 DIAGNOSIS — Z6835 Body mass index (BMI) 35.0-35.9, adult: Secondary | ICD-10-CM

## 2018-04-29 DIAGNOSIS — E890 Postprocedural hypothyroidism: Secondary | ICD-10-CM | POA: Diagnosis present

## 2018-04-29 DIAGNOSIS — Z7902 Long term (current) use of antithrombotics/antiplatelets: Secondary | ICD-10-CM | POA: Diagnosis not present

## 2018-04-29 DIAGNOSIS — J45909 Unspecified asthma, uncomplicated: Secondary | ICD-10-CM | POA: Diagnosis present

## 2018-04-29 DIAGNOSIS — Z881 Allergy status to other antibiotic agents status: Secondary | ICD-10-CM | POA: Diagnosis not present

## 2018-04-29 DIAGNOSIS — R29705 NIHSS score 5: Secondary | ICD-10-CM | POA: Diagnosis present

## 2018-04-29 DIAGNOSIS — E785 Hyperlipidemia, unspecified: Secondary | ICD-10-CM | POA: Diagnosis present

## 2018-04-29 DIAGNOSIS — I10 Essential (primary) hypertension: Secondary | ICD-10-CM | POA: Diagnosis present

## 2018-04-29 DIAGNOSIS — Z7951 Long term (current) use of inhaled steroids: Secondary | ICD-10-CM

## 2018-04-29 DIAGNOSIS — E039 Hypothyroidism, unspecified: Secondary | ICD-10-CM | POA: Diagnosis present

## 2018-04-29 DIAGNOSIS — I639 Cerebral infarction, unspecified: Secondary | ICD-10-CM

## 2018-04-29 DIAGNOSIS — I2510 Atherosclerotic heart disease of native coronary artery without angina pectoris: Secondary | ICD-10-CM | POA: Diagnosis present

## 2018-04-29 DIAGNOSIS — R339 Retention of urine, unspecified: Secondary | ICD-10-CM | POA: Diagnosis present

## 2018-04-29 DIAGNOSIS — I6350 Cerebral infarction due to unspecified occlusion or stenosis of unspecified cerebral artery: Secondary | ICD-10-CM | POA: Diagnosis not present

## 2018-04-29 LAB — COMPREHENSIVE METABOLIC PANEL
ALT: 19 U/L (ref 0–44)
AST: 17 U/L (ref 15–41)
Albumin: 3.9 g/dL (ref 3.5–5.0)
Alkaline Phosphatase: 68 U/L (ref 38–126)
Anion gap: 15 (ref 5–15)
BUN: 21 mg/dL — ABNORMAL HIGH (ref 6–20)
CO2: 22 mmol/L (ref 22–32)
Calcium: 8.9 mg/dL (ref 8.9–10.3)
Chloride: 100 mmol/L (ref 98–111)
Creatinine, Ser: 1.37 mg/dL — ABNORMAL HIGH (ref 0.44–1.00)
GFR calc Af Amer: 50 mL/min — ABNORMAL LOW (ref >60)
GFR calc non Af Amer: 43 mL/min — ABNORMAL LOW (ref >60)
Glucose, Bld: 202 mg/dL — ABNORMAL HIGH (ref 70–99)
POTASSIUM: 3.6 mmol/L (ref 3.5–5.1)
Sodium: 137 mmol/L (ref 135–145)
Total Bilirubin: 0.5 mg/dL (ref 0.3–1.2)
Total Protein: 7.3 g/dL (ref 6.5–8.1)

## 2018-04-29 LAB — CBC
HCT: 41.7 % (ref 36.0–46.0)
Hemoglobin: 13.6 g/dL (ref 12.0–15.0)
MCH: 28.8 pg (ref 26.0–34.0)
MCHC: 32.6 g/dL (ref 30.0–36.0)
MCV: 88.2 fL (ref 80.0–100.0)
Platelets: 298 10*3/uL (ref 150–400)
RBC: 4.73 MIL/uL (ref 3.87–5.11)
RDW: 12.5 % (ref 11.5–15.5)
WBC: 7.1 10*3/uL (ref 4.0–10.5)
nRBC: 0 % (ref 0.0–0.2)

## 2018-04-29 LAB — DIFFERENTIAL
Abs Immature Granulocytes: 0.02 10*3/uL (ref 0.00–0.07)
Basophils Absolute: 0 10*3/uL (ref 0.0–0.1)
Basophils Relative: 0 %
EOS ABS: 0.1 10*3/uL (ref 0.0–0.5)
Eosinophils Relative: 2 %
Immature Granulocytes: 0 %
Lymphocytes Relative: 36 %
Lymphs Abs: 2.5 10*3/uL (ref 0.7–4.0)
Monocytes Absolute: 0.5 10*3/uL (ref 0.1–1.0)
Monocytes Relative: 7 %
NEUTROS PCT: 55 %
Neutro Abs: 3.9 10*3/uL (ref 1.7–7.7)

## 2018-04-29 LAB — ETHANOL: Alcohol, Ethyl (B): <10 mg/dL (ref <10)

## 2018-04-29 LAB — PROTIME-INR
INR: 1
Prothrombin Time: 13.1 seconds (ref 11.4–15.2)

## 2018-04-29 LAB — I-STAT BETA HCG BLOOD, ED (MC, WL, AP ONLY): I-stat hCG, quantitative: <5 m[IU]/mL (ref <5)

## 2018-04-29 LAB — CBG MONITORING, ED: Glucose-Capillary: 187 mg/dL — ABNORMAL HIGH (ref 70–99)

## 2018-04-29 LAB — I-STAT TROPONIN, ED: Troponin i, poc: 0 ng/mL (ref 0.00–0.08)

## 2018-04-29 LAB — APTT: aPTT: 28 seconds (ref 24–36)

## 2018-04-29 MED ORDER — ASPIRIN EC 325 MG PO TBEC: 325.0000 mg | DELAYED_RELEASE_TABLET | Freq: Once | ORAL | Status: DC

## 2018-04-29 MED ORDER — SODIUM CHLORIDE 0.9% FLUSH
3.0000 mL | Freq: Once | INTRAVENOUS | Status: AC
  Administered 2018-04-30: 3 mL via INTRAVENOUS

## 2018-04-29 MED ORDER — ASPIRIN 81 MG PO CHEW
324.0000 mg | CHEWABLE_TABLET | Freq: Once | ORAL | Status: AC
  Administered 2018-04-29: 324 mg via ORAL

## 2018-04-29 NOTE — Consult Note (Addendum)
Requesting Physician: Dr. Sedonia Small    Chief Complaint: Slurred speech,headache and left side weakness  History obtained from: Patient and Chart    HPI:                                                                                                                                       [name] is an 58 y.o. female medical history of hypertension, hyperlipidemia, diabetes mellitus, obesity presents to the emergency room with slurred speech and left-sided weakness. Was last seen by her husband to be normal before he went to work.  Around 6 PM patient complained of severe headache and daughter went by and gave her some Tylenol.  Her headache did not improve and around 9:15 PM when the daughter talked to her over the phone she did not have slurred speech.  However, husband on reaching home noticed she has having slurred speech and called EMS.  She was stroke alerted.  A stat CT head was performed which did not show any acute hemorrhage.  TPA was not administered as symptom onset was unclear, since her headache started at 6 PM this would be her last known normal and she was outside the window for TPA.  Her NIH stroke scale was 5.  No neglect, aphasia or vision changes noted.    Date last known well: 2.1.20 Time last known well: 6pm tPA Given: no, unclear time of onset of symptoms, last known well >4.5hrs NIHSS: 5 Baseline MRS 0   Past Medical History:  Diagnosis Date  . Anemia    hx of   . 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 Asthma    due to allergies   . Cancer ([place])   . Complication of anesthesia   . Diabetes mellitus   . Dyspnea    increased exertion   . Dysrhythmia    " irregular heart rate per patient"  . Family history of adverse reaction to anesthesia    difficult for father to wake after surgery  . Heart murmur   . Hypercholesteremia   . Hypertension   . PONV (postoperative nausea and vomiting)   . Thyroid disease   . TIA (transient ischemic attack)     Past Surgical  History:  Procedure Laterality Date  . ABDOMINAL HYSTERECTOMY    . CHOLECYSTECTOMY    . COLON SURGERY    . IR RADIOLOGIST EVAL & MGMT  01/12/2017  . IR RADIOLOGIST EVAL & MGMT  06/16/2017  . IR RADIOLOGIST EVAL & MGMT  08/31/2017  . IR RADIOLOGIST EVAL & MGMT  12/13/2017  . IR RADIOLOGIST EVAL & MGMT  03/16/2018  . KNEE ARTHROSCOPY    . LEFT HEART CATHETERIZATION WITH CORONARY ANGIOGRAM N/A 04/24/2014   Procedure: LEFT HEART CATHETERIZATION WITH CORONARY ANGIOGRAM;  Surgeon: Burnell Blanks, MD;  Location[facility] CATH LAB;  Service: Cardiovascular;  Laterality: N/A;  . RADIOFREQUENCY  ABLATION Right 08/05/2017   Procedure: CT RENAL CRYO AND BIOPSY;  Surgeon: Sandi Mariscal, MD;  Location: [facility] ORS;  Service: Anesthesiology;  Laterality: Right;  . THYROIDECTOMY N/A 09/20/2014   Procedure: TOTAL THYROIDECTOMY;  Surgeon: Armandina Gemma, MD;  Location: [facility] ORS;  Service: General;  Laterality: N/A;  . TONSILLECTOMY      Family History  Problem Relation Age of Onset  . Heart failure Mother   . Diabetes Mother   . Stroke Father   . Heart failure Father   . Cancer Father    Social History:  reports that she has never smoked. She has never used smokeless tobacco. She reports that she does not drink alcohol or use drugs.  Allergies:  Allergies  Allergen Reactions  . Lorabid [Loracarbef] Anaphylaxis  . Augmentin [Amoxicillin-Pot Clavulanate] Hives, Nausea And Vomiting and Other (See Comments)    Chest pain  . Codeine Nausea And Vomiting  . Gadolinium Derivatives Itching and Swelling    Patient will require 13 hr prep prior to administration of gadolinium    . Sulfa Drugs Cross Reactors Other (See Comments)    Severe allergic reaction as a child - was not told symptoms  . Benicar [Olmesartan Medoxomil] Swelling and Rash  . Other Rash    Green peas    Medications:                                                                                                                        I reviewed home  medications   ROS:                                                                                                                                     14 systems reviewed and negative except above    Examination:                                                                                                      General: Appears well-developed  Psych: Affect appropriate to situation Eyes: No scleral injection HENT: No OP obstrucion Head: Normocephalic.  Cardiovascular: Normal rate and regular rhythm.  Respiratory: Effort normal and breath sounds normal to anterior ascultation GI: Soft.  No distension. There is no tenderness.  Skin: WDI    Neurological Examination Mental Status: Alert, oriented, thought content appropriate.  Slurred speech but  fluent without evidence of aphasia. Able to follow 3 step commands without difficulty. Cranial Nerves: II: Visual fields grossly normal III,IV, VI: ptosis not present, extra-ocular motions intact bilaterally, pupils equal, round, reactive to light and accommodation V,VII: Mild left facial droop, slightly reduced sensation on the left side of face VIII: hearing normal bilaterally IX,X: uvula rises symmetrically XI: bilateral shoulder shrug XII: midline tongue extension Motor: Right : Upper extremity   5/5    Left:     Upper extremity   4/5  Lower extremity   5/5     Lower extremity   4/5 Tone and bulk:normal tone throughout; no atrophy noted Sensory: Reduced sensation to light touch and temperature of the left arm leg and face Deep Tendon Reflexes: 2+ and symmetric throughout Plantars: Right: downgoing   Left: downgoing Cerebellar: Slightly impaired finger-to-nose and heel-to-shin however not out of proportion to weakness Gait: not assessed     Lab Results: Basic Metabolic Panel: Recent Labs  Lab 04/29/18 06-24-2222  NA 137  K 3.6  CL 100  CO2 22  GLUCOSE 202*  BUN 21*  CREATININE 1.37*  CALCIUM 8.9    CBC: Recent Labs   Lab 04/29/18 2222/06/24  WBC 7.1  NEUTROABS 3.9  HGB 13.6  HCT 41.7  MCV 88.2  PLT 298    Coagulation Studies: Recent Labs    04/29/18 2222/06/24  LABPROT 13.1  INR 1.00    Imaging: Ct Head Code Stroke Wo Contrast  Result Date: 04/29/2018 CLINICAL DATA:  Code stroke. Headache and slurred speech. History of diabetes, hypertension and hypercholesterolemia. EXAM: CT HEAD WITHOUT CONTRAST TECHNIQUE: Contiguous axial images were obtained from the base of the skull through the vertex without intravenous contrast. COMPARISON:  MRI of the head May 06, 2016 FINDINGS: BRAIN: No intraparenchymal hemorrhage, mass effect nor midline shift. No parenchymal brain volume loss for age. Patchy frontal white matter hypodensities similar to prior MRI given differences in imaging technique. No acute large vascular territory infarcts. No abnormal extra-axial fluid collections. Basal cisterns are patent. VASCULAR: Faint calcific atherosclerosis carotid siphons. SKULL/SOFT TISSUES: No skull fracture. No significant soft tissue swelling. ORBITS/SINUSES: The included ocular globes and orbital contents are normal.Paranasal sinuses are well aerated. Mastoid air cells are well aerated. OTHER: None. ASPECTS [facility] Stroke Program Early CT Score) - Ganglionic level infarction (caudate, lentiform nuclei, internal capsule, insula, M1-M3 cortex): 7 - Supraganglionic infarction (M4-M6 cortex): 3 Total score (0-10 with 10 being normal): 10 IMPRESSION: 1. No acute intracranial process. 2. ASPECTS is 10. 3. Mild chronic small vessel ischemic changes. 4. Critical Value/emergent results text paged to [facility], Neurology via AMION secure system on 04/29/2018 at 10:33 pm, including interpreting physician's phone number. Electronically Signed   By: Elon Alas M.D.   On: 04/29/2018 22:34   Reviewed Head CT: no acute findings   ASSESSMENT AND PLAN  59 y.o. female medical history of hypertension, hyperlipidemia, diabetes mellitus,  obesity presents to the emergency room with slurred speech and left-sided weakness.  Acute Ischemic Stroke   Risk factors: Hypertension, hyperlipidemia, Etiology: small vessel disease  Recommend # MRI of the brain without contrast #MRA Head and  neck  #Transthoracic Echo  # Start patient on ASA  81mg  and Plavix 75 mg daily x 3 weeks, takes Plavix at home #Start or continue Atorvastatin 40 mg/other high intensity statin # BP goal: permissive HTN upto 185/110 mmHg # HBAIC and Lipid profile # Telemetry monitoring # Frequent neuro checks # stroke swallow screen  Please page stroke NP  Or  PA  Or MD from 8am -4 pm  as this patient from this time will be  followed by the stroke.   You can look them up on [email or web address]  Password [facility]   Sushanth Aroor [facility] Pager Number [phone]

## 2018-04-29 NOTE — H&P (Signed)
History and Physical   [name] UMP:[id number] DOB: May 29, 1960 DOA: 04/29/2018  Referring MD/NP/PA: Dr. Sedonia Small  PCP: Lin Landsman, MD   Outpatient Specialists: None  Patient coming from: Home  Chief Complaint: Slurred speech  HPI: [name] is a 58 y.o. female with medical history significant of diabetes, hypertension, asthma, hypothyroidism, previous TIAs, who was last seen normal around 6 PM.  Patient left for work at the time.  Patient then started having headaches and 2 hours later started having slurred speech.  She was brought to the ER where a code stroke was called.  Patient has had initial head CT showing no large vessel disease or occlusion.  Scheduled for MRI.  She continues to have some slurred speech but no focal weakness.  Denied any fall or injury.  She is fully awake and alert and communicating.  Neurology has been consulted and patient is being admitted with possible acute CVA or TIA..  ED Course: Temperature 98 to blood pressure 151/78 pulse 80 respiratory 22 oxygen sat 98% room air.  CBC and chemistry largely within normal except for creatinine 1.37.  Head CT without contrast showed no acute findings.  MRI of the brain is currently pending.  Patient is being admitted with possible CVA.  Review of Systems: As per HPI otherwise 10 point review of systems negative.    Past Medical History:  Diagnosis Date  . Anemia    hx of   . 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 Asthma    due to allergies   . Cancer ([place])   . Complication of anesthesia   . Diabetes mellitus   . Dyspnea    increased exertion   . Dysrhythmia    " irregular heart rate per patient"  . Family history of adverse reaction to anesthesia    difficult for father to wake after surgery  . Heart murmur   . Hypercholesteremia   . Hypertension   . PONV (postoperative nausea and vomiting)   . Thyroid disease   . TIA (transient ischemic attack)     Past Surgical History:  Procedure Laterality Date  .  ABDOMINAL HYSTERECTOMY    . CHOLECYSTECTOMY    . COLON SURGERY    . IR RADIOLOGIST EVAL & MGMT  01/12/2017  . IR RADIOLOGIST EVAL & MGMT  06/16/2017  . IR RADIOLOGIST EVAL & MGMT  08/31/2017  . IR RADIOLOGIST EVAL & MGMT  12/13/2017  . IR RADIOLOGIST EVAL & MGMT  03/16/2018  . KNEE ARTHROSCOPY    . LEFT HEART CATHETERIZATION WITH CORONARY ANGIOGRAM N/A 04/24/2014   Procedure: LEFT HEART CATHETERIZATION WITH CORONARY ANGIOGRAM;  Surgeon: Burnell Blanks, MD;  Location: [facility] CATH LAB;  Service: Cardiovascular;  Laterality: N/A;  . RADIOFREQUENCY ABLATION Right 08/05/2017   Procedure: CT RENAL CRYO AND BIOPSY;  Surgeon: Sandi Mariscal, MD;  Location: [facility] ORS;  Service: Anesthesiology;  Laterality: Right;  . THYROIDECTOMY N/A 09/20/2014   Procedure: TOTAL THYROIDECTOMY;  Surgeon: Armandina Gemma, MD;  Location: [facility] ORS;  Service: General;  Laterality: N/A;  . TONSILLECTOMY       reports that she has never smoked. She has never used smokeless tobacco. She reports that she does not drink alcohol or use drugs.  Allergies  Allergen Reactions  . Lorabid [Loracarbef] Anaphylaxis  . Augmentin [Amoxicillin-Pot Clavulanate] Hives, Nausea And Vomiting and Other (See Comments)    Chest pain  . Codeine Nausea And Vomiting  . Gadolinium Derivatives Itching and Swelling  Patient will require 13 hr prep prior to administration of gadolinium    . Sulfa Drugs Cross Reactors Other (See Comments)    Severe allergic reaction as a child - was not told symptoms  . Benicar [Olmesartan Medoxomil] Swelling and Rash  . Other Rash    Green peas    Family History  Problem Relation Age of Onset  . Heart failure Mother   . Diabetes Mother   . Stroke Father   . Heart failure Father   . Cancer Father      Prior to Admission medications   Medication Sig Start Date End Date Taking? Authorizing Provider  acetaminophen (TYLENOL) 500 MG tablet Take 2 tablets (1,000 mg total) by mouth every 6 (six) hours as needed (body  aches). 06/29/17   Geradine Girt, DO  albuterol (PROVENTIL HFA;VENTOLIN HFA) 108 (90 BASE) MCG/ACT inhaler Inhale 2 puffs into the lungs every 4 (four) hours as needed for wheezing or shortness of breath. 09/07/13   Leeanne Rio, MD  albuterol (PROVENTIL HFA;VENTOLIN HFA) 108 (90 Base) MCG/ACT inhaler Inhale 1-2 puffs into the lungs every 6 (six) hours as needed for wheezing or shortness of breath. 12/28/17   Jean Rosenthal, MD  b complex vitamins tablet Take 1 tablet by mouth daily.    [provider]  calcium-vitamin D (OSCAL WITH D) 500-200 MG-UNIT tablet Take 2 tablets by mouth 3 (three) times daily. 05/17/16   Geradine Girt, DO  clopidogrel (PLAVIX) 75 MG tablet Take 1 tablet (75 mg total) by mouth daily. 09/15/15   Florencia Reasons, MD  diphenhydrAMINE (BENADRYL) 25 MG tablet Take 25-50 mg by mouth at bedtime.     [provider]  diphenhydrAMINE (BENADRYL) 50 MG tablet Take 1 tablet (50 mg total) by mouth once for 1 dose. Take Prednisone 50 mg 13 hrs, 7 hrs & 1 hr prior to MR. Patient not taking: Reported on 12/28/2017 11/10/17 11/10/17  Sandi Mariscal, MD  fluticasone [facility]) 50 MCG/ACT nasal spray Place 1 spray into both nostrils daily for 20 days. Patient not taking: Reported on 03/03/2018 12/28/17 01/17/18  Jean Rosenthal, MD  furosemide (LASIX) 40 MG tablet Take 2 tablets (80 mg total) by mouth 2 (two) times daily. 05/17/16   Geradine Girt, DO  JANUMET 50-1000 MG tablet Take 1 tablet by mouth 2 (two) times daily. 09/16/17   [provider]  lansoprazole (PREVACID) 15 MG capsule Take 1 capsule (15 mg total) by mouth daily at 12 noon. 03/07/15   Little, Wenda Overland, MD  levothyroxine (SYNTHROID, LEVOTHROID) 300 MCG tablet Take 300 mcg by mouth daily before breakfast.    [provider]  lisinopril (PRINIVIL,ZESTRIL) 10 MG tablet Take 10 mg by mouth daily.  09/09/14   [provider]  loratadine (CLARITIN) 10 MG tablet Take 1 tablet (10 mg total) by mouth  daily for 10 days. Patient not taking: Reported on 03/03/2018 12/28/17 01/07/18  Jean Rosenthal, MD  metoprolol tartrate (LOPRESSOR) 100 MG tablet Take 100 mg by mouth 2 (two) times daily. 06/23/16   [provider]  Multiple Vitamins-Minerals (MULTIVITAMIN GUMMIES WOMENS) CHEW Chew 2 each by mouth daily.    [provider]  Omega-3 Fatty Acids (FISH OIL PO) Take 1 capsule by mouth daily.    [provider]  ondansetron (ZOFRAN ODT) 4 MG disintegrating tablet Take 1 tablet (4 mg total) by mouth every 6 (six) hours as needed. 03/04/18   Ward, Delice Bison, DO  potassium  chloride SA (K-DUR,KLOR-CON) 20 MEQ tablet Take 40 mEq by mouth 2 (two) times daily.     [provider]  pravastatin (PRAVACHOL) 20 MG tablet Take 20 mg by mouth daily.  06/25/13   [provider]  predniSONE (DELTASONE) 50 MG tablet Prednisone 50 mg by mouth 13 hrs, 7 hrs & 1 hr prior to MR. 11/10/17   Sandi Mariscal, MD  Probiotic Product (PROBIOTIC PO) Take 1 tablet by mouth daily.    [provider]  traMADol (ULTRAM) 50 MG tablet Take 1 tablet (50 mg total) by mouth every 6 (six) hours as needed. Patient taking differently: Take 50 mg by mouth every 6 (six) hours as needed for moderate pain.  09/21/17   Petrucelli, Glynda Jaeger, PA-C    Physical Exam: Vitals:   04/29/18 2249 04/29/18 2250 04/29/18 2300  BP: (!) 151/78  (!) 141/66  Pulse: 80 76 77  Resp: 20 (!) 22 (!) 22  SpO2: 98% 99% 99%      Constitutional: NAD, calm, comfortable Vitals:   04/29/18 2249 04/29/18 2250 04/29/18 2300  BP: (!) 151/78  (!) 141/66  Pulse: 80 76 77  Resp: 20 (!) 22 (!) 22  SpO2: 98% 99% 99%   Eyes: PERRL, lids and conjunctivae normal ENMT: Mucous membranes are moist. Posterior pharynx clear of any exudate or lesions.Normal dentition.  Neck: normal, supple, no masses, no thyromegaly Respiratory: clear to auscultation bilaterally, no wheezing, no crackles. Normal respiratory effort. No accessory  muscle use.  Cardiovascular: Regular rate and rhythm, no murmurs / rubs / gallops. No extremity edema. 2+ pedal pulses. No carotid bruits.  Abdomen: no tenderness, no masses palpated. No hepatosplenomegaly. Bowel sounds positive.  Musculoskeletal: no clubbing / cyanosis. No joint deformity upper and lower extremities. Good ROM, no contractures. Normal muscle tone.  Skin: no rashes, lesions, ulcers. No induration Neurologic: CN 2-12 grossly intact. Sensation intact, DTR normal. Strength 5/5 in all 4.  Dysarthria Psychiatric: Normal judgment and insight. Alert and oriented x 3. Normal mood.     Labs on Admission: I have personally reviewed following labs and imaging studies  CBC: Recent Labs  Lab 04/29/18 2224  WBC 7.1  NEUTROABS 3.9  HGB 13.6  HCT 41.7  MCV 88.2  PLT 387   Basic Metabolic Panel: Recent Labs  Lab 04/29/18 2224  NA 137  K 3.6  CL 100  CO2 22  GLUCOSE 202*  BUN 21*  CREATININE 1.37*  CALCIUM 8.9   GFR: CrCl cannot be calculated (Unknown ideal weight.). Liver Function Tests: Recent Labs  Lab 04/29/18 2224  AST 17  ALT 19  ALKPHOS 68  BILITOT 0.5  PROT 7.3  ALBUMIN 3.9   No results for input(s): LIPASE, AMYLASE in the last 168 hours. No results for input(s): AMMONIA in the last 168 hours. Coagulation Profile: Recent Labs  Lab 04/29/18 2224  INR 1.00   Cardiac Enzymes: No results for input(s): CKTOTAL, CKMB, CKMBINDEX, TROPONINI in the last 168 hours. BNP (last 3 results) No results for input(s): PROBNP in the last 8760 hours. HbA1C: No results for input(s): HGBA1C in the last 72 hours. CBG: Recent Labs  Lab 04/29/18 2217  GLUCAP 187*   Lipid Profile: No results for input(s): CHOL, HDL, LDLCALC, TRIG, CHOLHDL, LDLDIRECT in the last 72 hours. Thyroid Function Tests: No results for input(s): TSH, T4TOTAL, FREET4, T3FREE, THYROIDAB in the last 72 hours. Anemia Panel: No results for input(s): VITAMINB12, FOLATE, FERRITIN, TIBC, IRON,  RETICCTPCT in the last 72 hours.  Urine analysis:    Component Value Date/Time   COLORURINE YELLOW 03/03/2018 2358   APPEARANCEUR CLOUDY (A) 03/03/2018 2358   LABSPEC 1.012 03/03/2018 2358   PHURINE 5.0 03/03/2018 2358   GLUCOSEU NEGATIVE 03/03/2018 2358   HGBUR LARGE (A) 03/03/2018 2358   BILIRUBINUR NEGATIVE 03/03/2018 2358   [place] 03/03/2018 2358   PROTEINUR 30 (A) 03/03/2018 2358   UROBILINOGEN 0.2 10/07/2014 1009   NITRITE NEGATIVE 03/03/2018 2358   LEUKOCYTESUR LARGE (A) 03/03/2018 2358   Sepsis Labs: @LABRCNTIP (procalcitonin:4,lacticidven:4) )No results found for this or any previous visit (from the past 240 hour(s)).   Radiological Exams on Admission: Ct Head Code Stroke Wo Contrast  Result Date: 04/29/2018 CLINICAL DATA:  Code stroke. Headache and slurred speech. History of diabetes, hypertension and hypercholesterolemia. EXAM: CT HEAD WITHOUT CONTRAST TECHNIQUE: Contiguous axial images were obtained from the base of the skull through the vertex without intravenous contrast. COMPARISON:  MRI of the head May 06, 2016 FINDINGS: BRAIN: No intraparenchymal hemorrhage, mass effect nor midline shift. No parenchymal brain volume loss for age. Patchy frontal white matter hypodensities similar to prior MRI given differences in imaging technique. No acute large vascular territory infarcts. No abnormal extra-axial fluid collections. Basal cisterns are patent. VASCULAR: Faint calcific atherosclerosis carotid siphons. SKULL/SOFT TISSUES: No skull fracture. No significant soft tissue swelling. ORBITS/SINUSES: The included ocular globes and orbital contents are normal.Paranasal sinuses are well aerated. Mastoid air cells are well aerated. OTHER: None. ASPECTS [facility] Stroke Program Early CT Score) - Ganglionic level infarction (caudate, lentiform nuclei, internal capsule, insula, M1-M3 cortex): 7 - Supraganglionic infarction (M4-M6 cortex): 3 Total score (0-10 with 10 being normal):  10 IMPRESSION: 1. No acute intracranial process. 2. ASPECTS is 10. 3. Mild chronic small vessel ischemic changes. 4. Critical Value/emergent results text paged to [place], Neurology via AMION secure system on 04/29/2018 at 10:33 pm, including interpreting physician's phone number. Electronically Signed   By: Elon Alas M.D.   On: 04/29/2018 22:34    EKG: Independently reviewed.  It shows normal sinus rhythm with left anterior fascicular block.  No specific ST changes.  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  We will proceed with MRI, carotid Dopplers and echocardiogram.  Full work-up for CVA will be done.  Initiate aspirin and Plavix per protocol.  Patient will continue on that for at least 3 weeks.  #2 diabetes: Check serial CBGs and initiate sliding scale insulin.  #3 essential hypertension: Continue blood pressure monitoring.  #4 hypothyroidism: Patient has previous thyroid cancer.  At this point she is stable.  Continue levothyroxine.  #5 hyperlipidemia: Continue with statin  #6 nonobstructive coronary artery disease: Stable.  Patient will be placed on telemetry.   DVT prophylaxis: Heparin Code Status: Full code Family Communication: Husband with the patient in room Disposition Plan: To be determined Consults called: Neurology Dr. Malen Gauze Admission status: Observation  Severity of Illness: The appropriate patient status for this patient is OBSERVATION. Observation status is judged to be reasonable and necessary in order to provide the required intensity of service to ensure the patient's safety. The patient's presenting symptoms, physical exam findings, and initial radiographic and  laboratory data in the context of their medical condition is felt to place them at  decreased risk for further clinical deterioration. Furthermore, it is anticipated that the patient will be medically stable for discharge from the hospital within 2 midnights of admission. The following factors support the patient status of observation.   " The patient's presenting symptoms include slurred speech. " The physical exam findings include speech changes but no focal weakness. " The initial radiographic and laboratory data are normal head CT.     Barbette Merino MD [facility] Pager [phone]  If 7PM-7AM, please contact night-coverage [email or web address] Password [facility]  04/29/2018, 11:52 PM

## 2018-04-29 NOTE — ED Notes (Addendum)
Husband arrived to triage stating wife was in truck having a stroke.  States he just got off work and found her with headache, L sided weakness, and slurred speech.  LSW by husband at 7:30am.  Pt reports headache since 1800 and slurred speech since 2000.  Pt with L arm drift and mild expressive aphasia.Marland Kitchen  VAN POSITIVE. Pt taken to Res Room and Dr. Sedonia Small there to assess pt.  NS notified to activate Code Stroke VAN+

## 2018-04-29 NOTE — ED Provider Notes (Signed)
[facility] Emergency Department Provider Note MRN:  [id number]  Arrival date & time: 04/29/18     Chief Complaint   Strokelike symptoms History of Present Illness   [name] is a 58 y.o. year-old female with a history of TIA presenting to the ED with chief complaint of strokelike symptoms.  Last seen normal this morning at 7:30 AM when husband left for work.  Patient reports dull frontal headache that began at 6 PM.  Began experiencing left-sided weakness and numbness at 8 PM.  Having trouble speaking per husband.  Review of Systems  A complete 10 system review of systems was obtained and all systems are negative except as noted in the HPI and PMH.   Patient's Health History    Past Medical History:  Diagnosis Date  . Anemia    hx of   . 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 Asthma    due to allergies   . Cancer ([place])   . Complication of anesthesia   . Diabetes mellitus   . Dyspnea    increased exertion   . Dysrhythmia    " irregular heart rate per patient"  . Family history of adverse reaction to anesthesia    difficult for father to wake after surgery  . Heart murmur   . Hypercholesteremia   . Hypertension   . PONV (postoperative nausea and vomiting)   . Thyroid disease   . TIA (transient ischemic attack)     Past Surgical History:  Procedure Laterality Date  . ABDOMINAL HYSTERECTOMY    . CHOLECYSTECTOMY    . COLON SURGERY    . IR RADIOLOGIST EVAL & MGMT  01/12/2017  . IR RADIOLOGIST EVAL & MGMT  06/16/2017  . IR RADIOLOGIST EVAL & MGMT  08/31/2017  . IR RADIOLOGIST EVAL & MGMT  12/13/2017  . IR RADIOLOGIST EVAL & MGMT  03/16/2018  . KNEE ARTHROSCOPY    . LEFT HEART CATHETERIZATION WITH CORONARY ANGIOGRAM N/A 04/24/2014   Procedure: LEFT HEART CATHETERIZATION WITH CORONARY ANGIOGRAM;  Surgeon: Burnell Blanks, MD;  Location: [facility] CATH LAB;  Service: Cardiovascular;  Laterality: N/A;  . RADIOFREQUENCY ABLATION Right 08/05/2017   Procedure: CT  RENAL CRYO AND BIOPSY;  Surgeon: Sandi Mariscal, MD;  Location: [facility] ORS;  Service: Anesthesiology;  Laterality: Right;  . THYROIDECTOMY N/A 09/20/2014   Procedure: TOTAL THYROIDECTOMY;  Surgeon: Armandina Gemma, MD;  Location: [facility] ORS;  Service: General;  Laterality: N/A;  . TONSILLECTOMY      Family History  Problem Relation Age of Onset  . Heart failure Mother   . Diabetes Mother   . Stroke Father   . Heart failure Father   . Cancer Father     Social History   Socioeconomic History  . Marital status: Married    Spouse name: Glendell Docker  . Number of children: 2  . Years of education: 68  . Highest education level: Not on file  Occupational History    Comment: NA, disabled  Social Needs  . Financial resource strain: Not on file  . Food insecurity:    Worry: Not on file    Inability: Not on file  . Transportation needs:    Medical: Not on file    Non-medical: Not on file  Tobacco Use  . Smoking status: Never Smoker  . Smokeless tobacco: Never Used  Substance and Sexual Activity  . Alcohol use: No  . Drug use: No  . Sexual activity: Not  on file  Lifestyle  . Physical activity:    Days per week: Not on file    Minutes per session: Not on file  . Stress: Not on file  Relationships  . Social connections:    Talks on phone: Not on file    Gets together: Not on file    Attends religious service: Not on file    Active member of club or organization: Not on file    Attends meetings of clubs or organizations: Not on file    Relationship status: Not on file  . Intimate partner violence:    Fear of current or ex partner: Not on file    Emotionally abused: Not on file    Physically abused: Not on file    Forced sexual activity: Not on file  Other Topics Concern  . Not on file  Social History Narrative   Lives with husband and son     Physical Exam  Vital Signs and Nursing Notes reviewed Vitals:   04/29/18 2250 04/29/18 2300  BP:  (!) 141/66  Pulse: 76 77  Resp: (!) 22 (!) 22    SpO2: 99% 99%    CONSTITUTIONAL: Chronically ill-appearing, NAD NEURO:  Alert and oriented x 3, left pronator drift, left leg weakness, decreased left-sided sensation, mild slurred speech EYES:  eyes equal and reactive ENT/NECK:  no LAD, no JVD CARDIO: Regular rate, well-perfused, normal S1 and S2 PULM:  CTAB no wheezing or rhonchi GI/GU:  normal bowel sounds, non-distended, non-tender MSK/SPINE:  No gross deformities, no edema SKIN:  no rash, atraumatic PSYCH:  Appropriate speech and behavior  Diagnostic and Interventional Summary    EKG Interpretation  Date/Time:  Saturday April 29 2018 22:50:15 EST Ventricular Rate:  75 PR Interval:    QRS Duration: 100 QT Interval:  410 QTC Calculation: 458 R Axis:   -47 Text Interpretation:  Sinus rhythm Left anterior fascicular block Probable anteroseptal infarct, old Baseline wander in lead(s) II III aVF Confirmed by Gerlene Fee [phone]) on 04/29/2018 11:19:31 PM      Labs Reviewed  COMPREHENSIVE METABOLIC PANEL - Abnormal; Notable for the following components:      Result Value   Glucose, Bld 202 (*)    BUN 21 (*)    Creatinine, Ser 1.37 (*)    GFR calc non Af Amer 43 (*)    GFR calc Af Amer 50 (*)    All other components within normal limits  CBG MONITORING, ED - Abnormal; Notable for the following components:   Glucose-Capillary 187 (*)    All other components within normal limits  ETHANOL  PROTIME-INR  APTT  CBC  DIFFERENTIAL  RAPID URINE DRUG SCREEN, HOSP PERFORMED  URINALYSIS, ROUTINE W REFLEX MICROSCOPIC  I-STAT TROPONIN, ED  I-STAT BETA HCG BLOOD, ED ([facility], [facility], [facility] ONLY)  I-STAT TROPONIN, ED  I-STAT BETA HCG BLOOD, ED ([facility], [facility], [facility] ONLY)    CT HEAD CODE STROKE WO CONTRAST  Final Result    MR MRA HEAD WO CONTRAST    (Results Pending)  MR BRAIN WO CONTRAST    (Results Pending)    Medications  sodium chloride flush (NS) 0.9 % injection 3 mL (has no administration in time range)  aspirin chewable tablet 324 mg  (324 mg Oral Given 04/29/18 2309)     Procedures Critical Care Critical Care Documentation Critical care time provided by me (excluding procedures): 37 minutes  Condition necessitating critical care: Acute ischemic stroke  Components of critical care management: reviewing  of prior records, laboratory and imaging interpretation, frequent re-examination and reassessment of vital signs, initiation of code stroke protocol, discussion with consulting services.   ED Course and Medical Decision Making  I have reviewed the triage vital signs and the nursing notes.  Pertinent labs & imaging results that were available during my care of the patient were reviewed by me and considered in my medical decision making (see below for details).  Concern for acute ischemic stroke, code stroke initiated.  Evaluated by neurology, seems consistent with acute stroke onset time unclear, therefore TPA held.  Will undergo MR imaging, due to kidney function not a candidate for CT contrast.  Will admit to medicine for further care.  Barth Kirks. Sedonia Small, MD [place] [email or web address]  Final Clinical Impressions(s) / ED Diagnoses     ICD-10-CM   1. Acute ischemic stroke [facility]) I63.9     ED Discharge Orders    None         Maudie Flakes, MD 04/29/18 2321

## 2018-04-29 NOTE — ED Triage Notes (Signed)
Per Family pt was last seen normal when husband left for work.  Around 6pm pt began to have a headach and at 8pm her speech was slurred.  Reported VAN positive in triage.  Pt taken directly to CT after cleared by EDP

## 2018-04-30 ENCOUNTER — Encounter (HOSPITAL_COMMUNITY): Payer: Self-pay

## 2018-04-30 ENCOUNTER — Inpatient Hospital Stay (HOSPITAL_COMMUNITY): Payer: BC Managed Care – PPO

## 2018-04-30 ENCOUNTER — Other Ambulatory Visit: Payer: Self-pay

## 2018-04-30 DIAGNOSIS — R2990 Unspecified symptoms and signs involving the nervous system: Secondary | ICD-10-CM

## 2018-04-30 DIAGNOSIS — I6350 Cerebral infarction due to unspecified occlusion or stenosis of unspecified cerebral artery: Secondary | ICD-10-CM

## 2018-04-30 DIAGNOSIS — I10 Essential (primary) hypertension: Secondary | ICD-10-CM

## 2018-04-30 LAB — CBC
HCT: 38 % (ref 36.0–46.0)
Hemoglobin: 12.5 g/dL (ref 12.0–15.0)
MCH: 28.7 pg (ref 26.0–34.0)
MCHC: 32.9 g/dL (ref 30.0–36.0)
MCV: 87.4 fL (ref 80.0–100.0)
NRBC: 0 % (ref 0.0–0.2)
Platelets: 256 10*3/uL (ref 150–400)
RBC: 4.35 MIL/uL (ref 3.87–5.11)
RDW: 12.5 % (ref 11.5–15.5)
WBC: 7.2 10*3/uL (ref 4.0–10.5)

## 2018-04-30 LAB — CREATININE, SERUM
Creatinine, Ser: 1.11 mg/dL — ABNORMAL HIGH (ref 0.44–1.00)
GFR calc Af Amer: >60 mL/min (ref >60)
GFR calc non Af Amer: 55 mL/min — ABNORMAL LOW (ref >60)

## 2018-04-30 LAB — LIPID PANEL
Cholesterol: 121 mg/dL (ref 0–200)
HDL: 35 mg/dL — ABNORMAL LOW (ref >40)
LDL Cholesterol: 54 mg/dL (ref 0–99)
TRIGLYCERIDES: 161 mg/dL — AB (ref <150)
Total CHOL/HDL Ratio: 3.5 RATIO
VLDL: 32 mg/dL (ref 0–40)

## 2018-04-30 LAB — URINALYSIS, ROUTINE W REFLEX MICROSCOPIC
Bilirubin Urine: NEGATIVE
GLUCOSE, UA: NEGATIVE mg/dL
Hgb urine dipstick: NEGATIVE
Ketones, ur: NEGATIVE mg/dL
Nitrite: NEGATIVE
Protein, ur: NEGATIVE mg/dL
Specific Gravity, Urine: 1.009 (ref 1.005–1.030)
pH: 5 (ref 5.0–8.0)

## 2018-04-30 LAB — HEMOGLOBIN A1C
Hgb A1c MFr Bld: 6.8 % — ABNORMAL HIGH (ref 4.8–5.6)
Mean Plasma Glucose: 148.46 mg/dL

## 2018-04-30 LAB — RAPID URINE DRUG SCREEN, HOSP PERFORMED
Amphetamines: NOT DETECTED
Barbiturates: NOT DETECTED
Benzodiazepines: NOT DETECTED
Cocaine: NOT DETECTED
Opiates: NOT DETECTED
Tetrahydrocannabinol: NOT DETECTED

## 2018-04-30 LAB — GLUCOSE, CAPILLARY: GLUCOSE-CAPILLARY: 145 mg/dL — AB (ref 70–99)

## 2018-04-30 LAB — ECHOCARDIOGRAM COMPLETE
Height: 67 in
Weight: 3583.8 oz

## 2018-04-30 MED ORDER — PRAVASTATIN SODIUM 10 MG PO TABS
20.0000 mg | ORAL_TABLET | Freq: Every day | ORAL | Status: DC
  Administered 2018-04-30: 20 mg via ORAL
  Filled 2018-04-30: qty 2

## 2018-04-30 MED ORDER — FISH OIL 500 MG PO CAPS: 1.0000 | ORAL_CAPSULE | Freq: Every day | ORAL | 0 refills | Status: AC

## 2018-04-30 MED ORDER — FUROSEMIDE 80 MG PO TABS
80.0000 mg | ORAL_TABLET | Freq: Two times a day (BID) | ORAL | Status: DC
  Administered 2018-04-30: 80 mg via ORAL
  Filled 2018-04-30: qty 1

## 2018-04-30 MED ORDER — B COMPLEX-C PO TABS
1.0000 | ORAL_TABLET | Freq: Every day | ORAL | Status: DC
  Administered 2018-04-30: 1 via ORAL
  Filled 2018-04-30: qty 1

## 2018-04-30 MED ORDER — LINAGLIPTIN 5 MG PO TABS
5.0000 mg | ORAL_TABLET | Freq: Every day | ORAL | Status: DC
  Administered 2018-04-30: 5 mg via ORAL
  Filled 2018-04-30: qty 1

## 2018-04-30 MED ORDER — LEVOTHYROXINE SODIUM 100 MCG PO TABS
300.0000 ug | ORAL_TABLET | Freq: Every day | ORAL | Status: DC
  Administered 2018-04-30: 300 ug via ORAL
  Filled 2018-04-30 (×2): qty 3

## 2018-04-30 MED ORDER — SENNOSIDES-DOCUSATE SODIUM 8.6-50 MG PO TABS: 1.0000 | ORAL_TABLET | Freq: Every evening | ORAL | Status: DC | PRN

## 2018-04-30 MED ORDER — ACETAMINOPHEN 325 MG PO TABS
650.0000 mg | ORAL_TABLET | ORAL | Status: DC | PRN
  Administered 2018-04-30: 650 mg via ORAL
  Filled 2018-04-30: qty 2

## 2018-04-30 MED ORDER — PANTOPRAZOLE SODIUM 40 MG PO TBEC
40.0000 mg | DELAYED_RELEASE_TABLET | Freq: Every day | ORAL | Status: DC
  Administered 2018-04-30: 40 mg via ORAL
  Filled 2018-04-30: qty 1

## 2018-04-30 MED ORDER — ALBUTEROL SULFATE HFA 108 (90 BASE) MCG/ACT IN AERS: 1.0000 | INHALATION_SPRAY | Freq: Four times a day (QID) | RESPIRATORY_TRACT | Status: DC | PRN

## 2018-04-30 MED ORDER — SODIUM CHLORIDE 0.9 % IV SOLN
INTRAVENOUS | Status: DC
  Administered 2018-04-30: 03:00:00 via INTRAVENOUS

## 2018-04-30 MED ORDER — TRAMADOL HCL 50 MG PO TABS: 50.0000 mg | ORAL_TABLET | Freq: Four times a day (QID) | ORAL | Status: DC | PRN

## 2018-04-30 MED ORDER — ADULT MULTIVITAMIN W/MINERALS CH
1.0000 | ORAL_TABLET | Freq: Every day | ORAL | Status: DC
  Administered 2018-04-30: 1 via ORAL
  Filled 2018-04-30: qty 1

## 2018-04-30 MED ORDER — PNEUMOCOCCAL VAC POLYVALENT 25 MCG/0.5ML IJ INJ: 0.5000 mL | INJECTION | INTRAMUSCULAR | Status: DC

## 2018-04-30 MED ORDER — ACETAMINOPHEN 650 MG RE SUPP: 650.0000 mg | RECTAL | Status: DC | PRN

## 2018-04-30 MED ORDER — ACETAMINOPHEN 500 MG PO TABS: 1000.0000 mg | ORAL_TABLET | Freq: Four times a day (QID) | ORAL | Status: DC | PRN

## 2018-04-30 MED ORDER — ALBUTEROL SULFATE (2.5 MG/3ML) 0.083% IN NEBU: 2.5000 mg | INHALATION_SOLUTION | RESPIRATORY_TRACT | Status: DC | PRN

## 2018-04-30 MED ORDER — OMEGA-3-ACID ETHYL ESTERS 1 G PO CAPS
1.0000 g | ORAL_CAPSULE | Freq: Every day | ORAL | Status: DC
  Administered 2018-04-30: 1 g via ORAL
  Filled 2018-04-30: qty 1

## 2018-04-30 MED ORDER — CALCIUM CARBONATE-VITAMIN D 500-200 MG-UNIT PO TABS
2.0000 | ORAL_TABLET | Freq: Three times a day (TID) | ORAL | Status: DC
  Administered 2018-04-30 (×2): 2 via ORAL
  Filled 2018-04-30 (×2): qty 2

## 2018-04-30 MED ORDER — RISAQUAD PO CAPS
1.0000 | ORAL_CAPSULE | Freq: Every day | ORAL | Status: DC
  Administered 2018-04-30: 1 via ORAL
  Filled 2018-04-30: qty 1

## 2018-04-30 MED ORDER — ENOXAPARIN SODIUM 40 MG/0.4ML ~~LOC~~ SOLN
40.0000 mg | Freq: Every day | SUBCUTANEOUS | Status: DC
  Administered 2018-04-30: 40 mg via SUBCUTANEOUS
  Filled 2018-04-30: qty 0.4

## 2018-04-30 MED ORDER — LISINOPRIL 10 MG PO TABS
10.0000 mg | ORAL_TABLET | Freq: Every day | ORAL | Status: DC
  Administered 2018-04-30: 10 mg via ORAL
  Filled 2018-04-30: qty 1

## 2018-04-30 MED ORDER — ONDANSETRON 4 MG PO TBDP: 4.0000 mg | ORAL_TABLET | Freq: Four times a day (QID) | ORAL | Status: DC | PRN

## 2018-04-30 MED ORDER — CLOPIDOGREL BISULFATE 75 MG PO TABS
75.0000 mg | ORAL_TABLET | Freq: Every day | ORAL | Status: DC
  Administered 2018-04-30: 75 mg via ORAL
  Filled 2018-04-30: qty 1

## 2018-04-30 MED ORDER — ASPIRIN EC 81 MG PO TBEC: 81.0000 mg | DELAYED_RELEASE_TABLET | Freq: Every day | ORAL | 0 refills | Status: AC

## 2018-04-30 MED ORDER — ACETAMINOPHEN 160 MG/5ML PO SOLN: 650.0000 mg | ORAL | Status: DC | PRN

## 2018-04-30 MED ORDER — SITAGLIPTIN PHOS-METFORMIN HCL 50-1000 MG PO TABS: 1.0000 | ORAL_TABLET | Freq: Two times a day (BID) | ORAL | Status: DC

## 2018-04-30 MED ORDER — POTASSIUM CHLORIDE CRYS ER 20 MEQ PO TBCR
40.0000 meq | EXTENDED_RELEASE_TABLET | Freq: Two times a day (BID) | ORAL | Status: DC
  Administered 2018-04-30: 40 meq via ORAL
  Filled 2018-04-30: qty 2

## 2018-04-30 MED ORDER — MULTIVITAMIN GUMMIES WOMENS PO CHEW: 2.0000 | CHEWABLE_TABLET | Freq: Every day | ORAL | 0 refills | Status: AC

## 2018-04-30 MED ORDER — B COMPLEX PO TABS: 1.0000 | ORAL_TABLET | Freq: Every day | ORAL | 0 refills | Status: AC

## 2018-04-30 MED ORDER — STROKE: EARLY STAGES OF RECOVERY BOOK
Freq: Once | Status: AC
  Administered 2018-04-30: 06:00:00
  Filled 2018-04-30: qty 1

## 2018-04-30 MED ORDER — METFORMIN HCL 500 MG PO TABS
1000.0000 mg | ORAL_TABLET | Freq: Two times a day (BID) | ORAL | Status: DC
  Filled 2018-04-30 (×2): qty 2

## 2018-04-30 MED ORDER — METOPROLOL TARTRATE 50 MG PO TABS
100.0000 mg | ORAL_TABLET | Freq: Two times a day (BID) | ORAL | Status: DC
  Administered 2018-04-30: 100 mg via ORAL
  Filled 2018-04-30: qty 2

## 2018-04-30 MED ORDER — INFLUENZA VAC SPLIT QUAD 0.5 ML IM SUSY: 0.5000 mL | PREFILLED_SYRINGE | INTRAMUSCULAR | Status: DC

## 2018-04-30 MED ORDER — DIPHENHYDRAMINE HCL 25 MG PO CAPS
25.0000 mg | ORAL_CAPSULE | Freq: Every day | ORAL | Status: DC
  Filled 2018-04-30: qty 1

## 2018-04-30 NOTE — Progress Notes (Signed)
SLP Cancellation Note  Patient Details Name: [name] MRN: [id number] DOB: 01/20/1961   Cancelled treatment:       Reason Eval/Treat Not Completed: SLP screened, no needs identified, will sign off. Pt reports cognitive-linguistic skills at baseline.  [name], [place], [place] Speech-Language Pathologist Acute Rehabilitation Services Pager: [phone] Office: [phone] [place] 04/30/2018, 4:08 PM

## 2018-04-30 NOTE — Evaluation (Signed)
Occupational Therapy Evaluation Patient Details Name: [name] MRN: [id number] DOB: [birth year] Today's Date: 04/30/2018    History of Present Illness Patient is a 58 y/o female who presents with headache, slurred speech, and left weakness. Head CT/MRI-unremarkable. admitted for possible TIA. PMH includes TIA, HTN, DM, thyroid cancer.   Clinical Impression   PTA, pt was living with her husband and was independent. Pt currently performing ADLs and functional mobility at supervision level near baseline function. Educated pt on BEFAST for stroke signs and symptoms. All acute OT needs met and will sign off. Thank you.     Follow Up Recommendations  No OT follow up;Supervision - Intermittent    Equipment Recommendations  None recommended by OT    Recommendations for Other Services       Precautions / Restrictions Precautions Precautions: None Restrictions Weight Bearing Restrictions: No      Mobility Bed Mobility Overal bed mobility: Modified Independent             General bed mobility comments: OOB with PT  Transfers Overall transfer level: Needs assistance Equipment used: None Transfers: Sit to/from Stand Sit to Stand: Supervision;Modified independent (Device/Increase time)         General transfer comment: Supervision for safety due to first time seeing pt up and lines. Stood from [employer], from toilet x1.     Balance Overall balance assessment: Needs assistance Sitting-balance support: Feet supported;No upper extremity supported Sitting balance-Leahy Scale: Good Sitting balance - Comments: Able to reach outside BoS and donn socks without difficulty.    Standing balance support: During functional activity Standing balance-Leahy Scale: Good Standing balance comment: Able to stand at sink and wash hands without assist, no LOB reaching outside BoS.                            ADL either performed or assessed with clinical judgement   ADL Overall  ADL's : Needs assistance/impaired                                       General ADL Comments: Pt performing ADLs at supervision level and is near baseline function.      Vision Baseline Vision/History: Wears glasses Wears Glasses: Reading only Patient Visual Report: No change from baseline       Perception     Praxis      Pertinent Vitals/Pain Pain Assessment: 0-10 Pain Score: 4  Pain Location: headache Pain Descriptors / Indicators: Headache Pain Intervention(s): Monitored during session     Hand Dominance Left   Extremity/Trunk Assessment Upper Extremity Assessment Upper Extremity Assessment: Overall WFL for tasks assessed   Lower Extremity Assessment Lower Extremity Assessment: Defer to PT evaluation   Cervical / Trunk Assessment Cervical / Trunk Assessment: Normal   Communication Communication Communication: No difficulties   Cognition Arousal/Alertness: Awake/alert Behavior During Therapy: WFL for tasks assessed/performed Overall Cognitive Status: Within Functional Limits for tasks assessed                                 General Comments: Possibly some memory deficits but feel pt is near baseline cognition   General Comments  Provided education on BEFAST for stroke signs and symptoms    Exercises     Shoulder Instructions      Home  Living Family/patient expects to be discharged to:: Private residence Living Arrangements: Spouse/significant other Available Help at Discharge: Family;Available PRN/intermittently Type of Home: House Home Access: Stairs to enter [employer] of Steps: 2 Entrance Stairs-Rails: None Home Layout: One level     Bathroom Shower/Tub: Teacher, early years/pre: Standard     Home Equipment: Cane - single point          Prior Functioning/Environment Level of Independence: Independent        Comments: uses SPC as needed. Not working. Drives some. Does a little bit  of cooking/cleaning. No falls in last 6 months.        OT Problem List: Impaired balance (sitting and/or standing);Decreased activity tolerance;Decreased knowledge of precautions      OT Treatment/Interventions:      OT Goals(Current goals can be found in the care plan section) Acute Rehab OT Goals Patient Stated Goal: to go home OT Goal Formulation: All assessment and education complete, DC therapy  OT Frequency:     Barriers to D/C:            Co-evaluation              AM-PAC OT "6 Clicks" Daily Activity     Outcome Measure Help from another person eating meals?: None Help from another person taking care of personal grooming?: None Help from another person toileting, which includes using toliet, bedpan, or urinal?: None Help from another person bathing (including washing, rinsing, drying)?: None Help from another person to put on and taking off regular upper body clothing?: None Help from another person to put on and taking off regular lower body clothing?: None 6 Click Score: 24   End of Session Equipment Utilized During Treatment: Gait belt Nurse Communication: Mobility status  Activity Tolerance: Patient tolerated treatment well Patient left: in chair;with call bell/phone within reach  OT Visit Diagnosis: Unsteadiness on feet (R26.81);Other abnormalities of gait and mobility (R26.89);Muscle weakness (generalized) (M62.81)                Time: 5189-8421 OT Time Calculation (min): 13 min Charges:  OT General Charges $OT Visit: 1 Visit OT Evaluation $OT Eval Low Complexity: [place], OTR/L Acute Rehab Pager: [phone] Office: [place] 04/30/2018, 10:43 AM

## 2018-04-30 NOTE — Evaluation (Addendum)
Physical Therapy Evaluation Patient Details Name: [name] MRN: [id number] DOB: 09-08-1960 Today's Date: 04/30/2018   History of Present Illness  Patient is a 58 y/o female who presents with headache, slurred speech, and left weakness. Head CT/MRI-unremarkable. admitted for possible TIA. PMH includes TIA, HTN, DM, thyroid cancer.  Clinical Impression  Patient presents with HA s/p above. Reports slurred speech and weakness has resolved. Tolerated gait training and stair training with supervision for safety. Pt independent PTA and lives with spouse. Education re: sign/symptoms of stroke. Encouraged walking to bathroom with nursing and hallway ambulation while in the hospital. Pt does not require skilled therapy services. All education completed. Discharge from therapy.  I have discussed the patient's current level of function related to TIA with the patient.  She agrees she is close to baseline and has supportive spouse and daughter at home.        Follow Up Recommendations No PT follow up;Supervision - Intermittent    Equipment Recommendations  None recommended by PT    Recommendations for Other Services       Precautions / Restrictions Precautions Precautions: None Restrictions Weight Bearing Restrictions: No      Mobility  Bed Mobility Overal bed mobility: Modified Independent             General bed mobility comments: No assist needed.   Transfers Overall transfer level: Needs assistance Equipment used: None Transfers: Sit to/from Stand Sit to Stand: Supervision;Modified independent (Device/Increase time)         General transfer comment: Supervision for safety due to first time seeing pt up and lines. Stood from [employer], from toilet x1.   Ambulation/Gait Ambulation/Gait assistance: Supervision Gait Distance (Feet): 175 Feet Assistive device: None Gait Pattern/deviations: Step-through pattern;Decreased stride length;Wide base of support   Gait velocity  interpretation: 1.31 - 2.62 ft/sec, indicative of limited community ambulator General Gait Details: Slow, mostly steady gait with waddling like pattern. 2/4 DOE. No LOB. Mostly household ambulator at baseline  Stairs Stairs: Yes Stairs assistance: Supervision Stair Management: Two rails;Step to pattern Number of Stairs: 3(+ 2 steps) General stair comments: Cues for safety/lines.   Wheelchair Mobility    Modified Rankin (Stroke Patients Only) Modified Rankin (Stroke Patients Only) Pre-Morbid Rankin Score: Slight disability Modified Rankin: Slight disability     Balance Overall balance assessment: Needs assistance Sitting-balance support: Feet supported;No upper extremity supported Sitting balance-Leahy Scale: Good Sitting balance - Comments: Able to reach outside BoS and donn socks without difficulty.    Standing balance support: During functional activity Standing balance-Leahy Scale: Good Standing balance comment: Able to stand at sink and wash hands without assist, no LOB reaching outside BoS.                              Pertinent Vitals/Pain Pain Assessment: 0-10 Pain Score: 4  Pain Location: headache Pain Descriptors / Indicators: Headache Pain Intervention(s): Monitored during session;Repositioned    Home Living Family/patient expects to be discharged to:: Private residence Living Arrangements: Spouse/significant other Available Help at Discharge: Family;Available PRN/intermittently Type of Home: House Home Access: Stairs to enter Entrance Stairs-Rails: None Entrance Stairs-Number of Steps: 2 Home Layout: One level Home Equipment: Cane - single point      Prior Function Level of Independence: Independent         Comments: uses SPC as needed. Not working. Drives some. Does a little bit of cooking/cleaning. No falls in last 6 months.  Hand Dominance   Dominant Hand: Left    Extremity/Trunk Assessment   Upper Extremity  Assessment Upper Extremity Assessment: Defer to OT evaluation    Lower Extremity Assessment Lower Extremity Assessment: Overall WFL for tasks assessed       Communication   Communication: No difficulties  Cognition Arousal/Alertness: Awake/alert Behavior During Therapy: WFL for tasks assessed/performed Overall Cognitive Status: Within Functional Limits for tasks assessed                                 General Comments: Possibly some memory deficits.       General Comments      Exercises     Assessment/Plan    PT Assessment Patent does not need any further PT services  PT Problem List         PT Treatment Interventions      PT Goals (Current goals can be found in the Care Plan section)  Acute Rehab PT Goals Patient Stated Goal: to go home PT Goal Formulation: All assessment and education complete, DC therapy    Frequency     Barriers to discharge        Co-evaluation               AM-PAC PT "6 Clicks" Mobility  Outcome Measure Help needed turning from your back to your side while in a flat bed without using bedrails?: None Help needed moving from lying on your back to sitting on the side of a flat bed without using bedrails?: None Help needed moving to and from a bed to a chair (including a wheelchair)?: None Help needed standing up from a chair using your arms (e.g., wheelchair or bedside chair)?: None Help needed to walk in hospital room?: None Help needed climbing 3-5 steps with a railing? : A Little 6 Click Score: 23    End of Session Equipment Utilized During Treatment: Gait belt Activity Tolerance: Patient tolerated treatment well Patient left: Other (comment)(left in hallway with OT) Nurse Communication: Mobility status PT Visit Diagnosis: Muscle weakness (generalized) (M62.81)    Time: 5750-5183 PT Time Calculation (min) (ACUTE ONLY): 23 min   Charges:   PT Evaluation $PT Eval Low Complexity: 1 Low PT Treatments $Gait  Training: 8-22 mins        Wray Kearns, PT, DPT Acute Rehabilitation Services Pager [phone] Office [place] 04/30/2018, 8:36 AM

## 2018-04-30 NOTE — Plan of Care (Signed)
  Problem: General Medical Path Diagnosis Goal: Knowledge of disease or condition and therapeutic regimen will improve Outcome: Progressing Goal: Understanding of disease or condition will improve Outcome: Progressing Goal: Ability to adjust to condition or change in health will improve Outcome: Progressing

## 2018-04-30 NOTE — Discharge Summary (Signed)
**Note De-Identified vi Obfusction** Physicin Dischrge Summry  [name] KDX:[id number] DOB: [birth year] DOA: 04/29/2018  PCP: Lin Lndsmn, MD  Admit dte: 04/29/2018 Dischrge dte: 04/30/2018  Time spent: 40 minutes  Recommendtions for Outptient Follow-up:  1. Follow up outptient CBC/CMP 2. Ensure DAPT x 3 weeks, then plvix lone 3. Follow up with neurology s n outptient 4. Follow up with urology outptient for urinry retention, bldder spsms 5. 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 femle with medicl history significnt of dibetes, hypertension, sthm, hypothyroidism, previous TIAs, who ws lst seen norml round 6 PM.  Ptient left for work t the time.  Ptient then strted hving hedches nd 2 hours lter strted hving slurred speech.  She ws brought to the ER where  code stroke ws clled.  Ptient hs hd initil hed CT showing no lrge vessel disese or occlusion.  Scheduled for MRI.  She continues to hve some slurred speech but no focl wekness.  Denied ny fll or injury.  She is fully wke nd lert nd communicting.  Neurology hs been consulted nd ptient is being dmitted with possible cute CVA or TIA.Mrlnd Kitchen  She ws dmitted with slurred speech nd L sided wekness with concern for stroke.  Her MRI brin ws negtive.  She ws seen by neurology who suspected TIA vs complex migrine.  She ws strted on DAPT x3 weeks nd then plvix lone.  Pln outptient follow up with neurology.  At time of dischrge, she ws bck to her bseline.   See below for dditionl  detils  Hospitl Course:  #1 TIA vs Complex Migrine:  Admitted with concern for CVA MRI without cute bnormlity Echo with norml EF (see report) Preliminry crotid dopplers "ner norml", vertebrl rteries with ntegrde flow   Seen by neurology who recommended plvix/spirin x3 weeks, then plvix lone PT/OT/SLP not recommending ny follow up LDL 54, continue prvsttin A1c 6.8, continue home dibetes regimen Sinus on tele Pln for outptient follow up with neurology  #2 dibetes: resume home regimen  #3 essentil hypertension: resume home regimen   #4 hypothyroidism: Ptient hs previous thyroid cncer.  Continue levothyroxine.  #5 hyperlipidemi: Continue home sttin  #6 nonobstructive coronry rtery disese: Stble.  Ptient will be plced on telemetry.  # Bldder Spsms  Urinry retention: UA not c/w infection, repet bldder scn tody improved, rec f/u outptient with urology  Procedures: Echo IMPRESSIONS    1. The left ventricle hs norml systolic function of 97-67%. The cvity size is norml. There is norml left ventriculr wll thickness. Echo evidence of norml distolic filling ptterns.  2. Modertely dilted left tril size.  3. Norml right tril size.  4. No tril level shunt detected by color flow Doppler.  Prelim crotid Kore Summry: Right Crotid: The extrcrnil vessels were ner-norml with only miniml wll                thickening or plque.  Left Crotid: The extrcrnil vessels were ner-norml with only miniml wll               thickening or plque.  Vertebrls: **Note De-Identified vi Obfusction** Bilterl vertebrl rteries demonstrte ntegrde flow. Subclvins: Norml flow hemodynmics were seen in bilterl subclvin              rteries.  Consulttions:  neurology  Dischrge Exm: Vitls:   04/30/18 1000 04/30/18 1153  BP: 120/82 117/84  Pulse: 65 79  Resp: 20   Temp: 98.5 F (36.9 C) 99.3 F (37.4 C)  SpO2: 97% 98%   Feeling  better Redy for dischrge.  Generl: No cute distress. Crdiovsculr: Hert sounds show  regulr rte, nd rhythm.  Lungs: Cler to usculttion bilterlly Abdomen: Soft, nontender, nondistended  Neurologicl: Alert nd oriented 3. Moves ll extremities 4. Crnil nerves II through XII intct. Skin: Wrm nd dry. No rshes or lesions. Extremities: No clubbing or cynosis. No edem.  Psychitric: Mood nd ffect re norml. Insight nd judgment re pproprite.  Discussed with nursing to touch bse with CM for possible code 44  Dischrge Instructions   Dischrge Instructions    Ambultory referrl to Neurology   Complete by:  As directed    An ppointment is requested in pproximtely: 4 weeks   Cll MD for:  difficulty brething, hedche or visul disturbnces   Complete by:  As directed    Cll MD for:  extreme ftigue   Complete by:  As directed    Cll MD for:  hives   Complete by:  As directed    Cll MD for:  persistnt dizziness or light-hededness   Complete by:  As directed    Cll MD for:  persistnt nuse nd vomiting   Complete by:  As directed    Cll MD for:  redness, tenderness, or signs of infection (pin, swelling, redness, odor or green/yellow dischrge round incision site)   Complete by:  As directed    Cll MD for:  severe uncontrolled pin   Complete by:  As directed    Cll MD for:  temperture >100.4   Complete by:  As directed    Diet - low sodium hert helthy   Complete by:  As directed    Dischrge instructions   Complete by:  As directed    You were seen for  possible trnsient ischemic ttck.  Your workup ws essentilly unremrkble.  We're plnning to strt you on spirin nd plvix for 21 dys, then return to tking plvix lone.  Plese follow up with your PCP regrding your blood pressure.  Plese follow up with urology regrding your urinry retention nd bldder spsms.  We'll refer you bck to neurology s n  outptient.  Return for new, recurrent, or worsening symptoms.  Plese sk your PCP to request records from this hospitliztion so they know wht ws done nd wht the next steps will be.   Increse ctivity slowly   Complete by:  As directed      Allergies s of 04/30/2018      Rections   Lorbid [lorcrbef] Anphylxis   Augmentin [moxicillin-pot Clvulnte] Hives, Nuse And Vomiting, Other (See Comments)   Chest pin   Codeine Nuse And Vomiting   Gdolinium Derivtives Itching, Swelling   Ptient will require 13 hr prep prior to dministrtion of gdolinium     Sulf Drugs Cross Rectors Other (See Comments)   Severe llergic rection s  child - ws not told symptoms   Benicr [olmesrtn Medoxomil] Swelling, Rsh   Other Rsh   Green pes      Mediction List    STOP tking these medictions   ibuprofen 200 **Note De-Identified vi Obfusction** MG tblet Commonly known s:  ADVIL,MOTRIN     TAKE these medictions   cetminophen 500 MG tblet Commonly known s:  TYLENOL Tke 2 tblets (1,000 mg totl) by mouth every 6 (six) hours s needed (body ches).   lbuterol 108 (90 Bse) MCG/ACT inhler Commonly known s:  PROVENTIL HFA;VENTOLIN HFA Inhle 1-2 puffs into the lungs every 6 (six) hours s needed for wheezing or shortness of breth.   spirin EC 81 MG tblet Tke 1 tblet (81 mg totl) by mouth dily for 21 dys.   b complex vitmins tblet Tke 1 tblet by mouth dily for 30 dys.   clcium-vitmin D 500-200 MG-UNIT tblet Commonly known s:  OSCAL WITH D Tke 2 tblets by mouth 3 (three) times dily.   clopidogrel 75 MG tblet Commonly known s:  PLAVIX Tke 1 tblet (75 mg totl) by mouth dily.   diphenhydrAMINE 25 MG tblet Commonly known s:  BENADRYL Tke 25-50 mg by mouth t bedtime.   Fish Oil 500 MG Cps Tke 1 cpsule (500 mg totl) by mouth dily for 30 dys. Wht chnged:    mediction strength  how much to tke   furosemide 40 MG tblet Commonly known s:   LASIX Tke 2 tblets (80 mg totl) by mouth 2 (two) times dily.   JANUMET 50-1000 MG tblet Generic drug:  sitGLIPtin-metformin Tke 1 tblet by mouth 2 (two) times dily.   lnsoprzole 15 MG cpsule Commonly known s:  PREVACID Tke 1 cpsule (15 mg totl) by mouth dily t 12 noon.   levothyroxine 300 MCG tblet Commonly known s:  SYNTHROID, LEVOTHROID Tke 300 mcg by mouth dily before brekfst.   lisinopril 10 MG tblet Commonly known s:  PRINIVIL,ZESTRIL Tke 10 mg by mouth dily.   metoprolol trtrte 100 MG tblet Commonly known s:  LOPRESSOR Tke 100 mg by mouth 2 (two) times dily.   MULTIVITAMIN GUMMIES WOMENS Dion Fnti 2 ech by mouth dily for 30 dys.   ondnsetron 4 MG disintegrting tblet Commonly known s:  ZOFRAN ODT Tke 1 tblet (4 mg totl) by mouth every 6 (six) hours s needed. Wht chnged:  resons to tke this   potssium chloride SA 20 MEQ tblet Commonly known s:  K-DUR,KLOR-CON Tke 40 mEq by mouth 2 (two) times dily.   prvsttin 20 MG tblet Commonly known s:  PRAVACHOL Tke 20 mg by mouth dily.   PROBIOTIC PO Tke 1 tblet by mouth dily.   trMADol 50 MG tblet Commonly known s:  ULTRAM Tke 1 tblet (50 mg totl) by mouth every 6 (six) hours s needed. Wht chnged:  resons to tke this      Allergies  Allergen Rections  . Lorbid [Lorcrbef] Anphylxis  . Augmentin [Amoxicillin-Pot Clvulnte] Hives, Nuse And Vomiting nd Other (See Comments)    Chest pin  . Codeine Nuse And Vomiting  . Gdolinium Derivtives Itching nd Swelling    Ptient will require 13 hr prep prior to dministrtion of gdolinium    . Sulf Drugs Cross Rectors Other (See Comments)    Severe llergic rection s  child - ws not told symptoms  . Benicr [Olmesrtn Medoxomil] Swelling nd Rsh  . Other Rsh    Green pes   Follow-up Informtion    Lin Lndsmn, MD Follow up.   Specilty:  [facility] Contct  informtion: [place] [id number] [phone]        [facility] Follow up.   Contct informtion:  [address]     [address] [place] [id number]       Lucas Mallow, MD Follow up.   Specialty:  Urology Contact information: [place] [address] [phone]            The results of significant diagnostics from this hospitalization (including imaging, microbiology, ancillary and laboratory) are listed below for reference.    Significant Diagnostic Studies: Mr Virgel Paling VQ Contrast  Result Date: 04/30/2018 CLINICAL DATA:  LEFT-sided weakness and numbness beginning 8 p.m. Headaches beginning at 6 p.m. Assess for stroke. History of hypertension, hypercholesterolemia and diabetes. EXAM: MRI HEAD WITHOUT CONTRAST MRA HEAD WITHOUT CONTRAST TECHNIQUE: Multiplanar, multiecho pulse sequences of the brain and surrounding structures were obtained without intravenous contrast. Angiographic images of the head were obtained using MRA technique without contrast. COMPARISON:  CT HEAD April 29, 2018 at 2226 hours and MRI/MRA head May 16, 2016. FINDINGS: MRI HEAD FINDINGS-mild motion degraded examination. INTRACRANIAL CONTENTS: No reduced diffusion to suggest acute ischemia or hyperacute demyelination. No susceptibility artifact to suggest hemorrhage. No parenchymal brain volume loss for age. Scattered subcentimeter supratentorial white matter FLAIR T2 hyperintensities are similar. No hydrocephalus. No suspicious parenchymal signal, masses, mass effect. No abnormal extra-axial fluid collections. No extra-axial masses. VASCULAR: Normal major intracranial vascular flow voids present at skull base. SKULL AND UPPER CERVICAL SPINE: No abnormal sellar expansion. No suspicious calvarial bone marrow signal. Craniocervical junction maintained. SINUSES/ORBITS: Paranasal sinuses are well aerated. Trace RIGHT  mastoid effusion.The included ocular globes and orbital contents are non-suspicious. OTHER: None. MRA HEAD FINDINGS-moderately motion degraded examination. ANTERIOR CIRCULATION: Normal flow related enhancement of the included cervical, petrous, cavernous and supraclinoid internal carotid arteries; mild luminal irregularity seen with motion artifact or atherosclerosis. Patent anterior communicating artery. Patent anterior and middle cerebral arteries. No large vessel occlusion, flow limiting stenosis, aneurysm. POSTERIOR CIRCULATION: Codominant vertebral arteries. Vertebrobasilar arteries are patent, with normal flow related enhancement of the main branch vessels. Patent posterior cerebral arteries. Bilateral posterior communicating arteries present. No large vessel occlusion, flow limiting stenosis,  aneurysm. ANATOMIC VARIANTS: None. Source images and MIP images were reviewed. IMPRESSION: MRI HEAD: 1. No acute intracranial process on this mildly motion degraded examination. 2. Stable mild chronic small vessel ischemic changes. MRA HEAD: 1. Negative motion degraded MRA head. Electronically Signed   By: Elon Alas M.D.   On: 04/30/2018 00:52   Mr Brain Wo Contrast  Result Date: 04/30/2018 CLINICAL DATA:  LEFT-sided weakness and numbness beginning 8 p.m. Headaches beginning at 6 p.m. Assess for stroke. History of hypertension, hypercholesterolemia and diabetes. EXAM: MRI HEAD WITHOUT CONTRAST MRA HEAD WITHOUT CONTRAST TECHNIQUE: Multiplanar, multiecho pulse sequences of the brain and surrounding structures were obtained without intravenous contrast. Angiographic images of the head were obtained using MRA technique without contrast. COMPARISON:  CT HEAD April 29, 2018 at 2226 hours and MRI/MRA head May 16, 2016. FINDINGS: MRI HEAD FINDINGS-mild motion degraded examination. INTRACRANIAL CONTENTS: No reduced diffusion to suggest acute ischemia or hyperacute demyelination. No susceptibility artifact to  suggest hemorrhage. No parenchymal brain volume loss for age. Scattered subcentimeter supratentorial white matter FLAIR T2 hyperintensities are similar. No hydrocephalus. No suspicious parenchymal signal, masses, mass effect. No abnormal extra-axial fluid collections. No extra-axial masses. VASCULAR: Normal major intracranial vascular flow voids present at skull base. SKULL AND UPPER CERVICAL SPINE: No abnormal sellar expansion. No suspicious calvarial bone marrow signal. Craniocervical junction maintained. SINUSES/ORBITS: Paranasal sinuses are well aerated. Trace RIGHT mastoid effusion.The **Note De-Identified vi Obfusction** included oculr globes nd orbitl contents re non-suspicious. OTHER: None. MR HED FINDINGS-modertely motion degrded exmintion. NTERIOR CIRCULTION: Norml flow relted enhncement of the included cervicl, petrous, cvernous nd suprclinoid internl crotid rteries; mild luminl irregulrity seen with motion rtifct or therosclerosis. Ptent nterior communicting rtery. Ptent nterior nd middle cerebrl rteries. No lrge vessel occlusion, flow limiting stenosis, neurysm. POSTERIOR CIRCULTION: Codominnt vertebrl rteries. Vertebrobsilr rteries re ptent, with norml flow relted enhncement of the min brnch vessels. Ptent posterior cerebrl rteries. Bilterl posterior communicting rteries present. No lrge vessel occlusion, flow limiting stenosis,  neurysm. NTOMIC VRINTS: None. Source imges nd MIP imges were reviewed. IMPRESSION: MRI HED: 1. No cute intrcrnil process on this mildly motion degrded exmintion. 2. Stble mild chronic smll vessel ischemic chnges. MR HED: 1. Negtive motion degrded MR hed. Electroniclly Signed   By: Elon ls M.D.   On: 04/30/2018 00:52   Ct Hed Code Stroke Wo Contrst  Result Dte: 04/29/2018 CLINICL DT:  Code stroke. Hedche nd slurred speech. History of dibetes, hypertension nd hypercholesterolemi. EXM: CT HED WITHOUT  CONTRST TECHNIQUE: Contiguous xil imges were obtined from the bse of the skull through the vertex without intrvenous contrst. COMPRISON:  MRI of the hed Februry 8, 2018 FINDINGS: BRIN: No intrprenchyml hemorrhge, mss effect nor midline shift. No prenchyml brin volume loss for ge. Ptchy frontl white mtter hypodensities similr to prior MRI given differences in imging technique. No cute lrge vsculr territory infrcts. No bnorml extr-xil fluid collections. Bsl cisterns re ptent. VSCULR: Fint clcific therosclerosis crotid siphons. SKULL/SOFT TISSUES: No skull frcture. No significnt soft tissue swelling. ORBITS/SINUSES: The included oculr globes nd orbitl contents re norml.Prnsl sinuses re well erted. Mstoid ir cells re well erted. OTHER: None. SPECTS [facility] Stroke Progrm Erly CT Score) - Gnglionic level infrction (cudte, lentiform nuclei, internl cpsule, insul, M1-M3 cortex): 7 - Suprgnglionic infrction (M4-M6 cortex): 3 Totl score (0-10 with 10 being norml): 10 IMPRESSION: 1. No cute intrcrnil process. 2. SPECTS is 10. 3. Mild chronic smll vessel ischemic chnges. 4. Criticl Vlue/emergent results text pged to [place], Neurology vi [employer] secure system on 04/29/2018 t 10:33 pm, including interpreting physicin's phone number. Electroniclly Signed   By: Elon ls M.D.   On: 04/29/2018 22:34   Vs Kore Crotid (t [place] Only)  Result Dte: 04/30/2018 Crotid rteril Duplex Study Indictions:       Speech disturbnce nd Hedche. Risk Fctors:      Hypertension, Dibetes. Other Fctors:     History of TI. Comprison Study:  Prior study from 05/17/16 is vilble for comprison. Performing Technologist: Shrion Dove RVS  Exmintion Guidelines:  complete evlution includes B-mode imging, spectrl Doppler, color Doppler, nd power Doppler s needed of ll ccessible portions of ech vessel. Bilterl testing is  considered n integrl prt of  complete exmintion. Limited exmintions for reoccurring indictions my be performed s noted.  Right Crotid Findings: +----------+--------+--------+--------+------------+--------+           PSV cm/sEDV cm/sStenosisDescribe    Comments +----------+--------+--------+--------+------------+--------+ CC Prox  86      26                                   +----------+--------+--------+--------+------------+--------+ CC Distl63      22              heterogenous         +----------+--------+--------+--------+------------+--------+ IC Prox  67  26              heterogenous         +----------+--------+--------+--------+------------+--------+ ICA Distal75      31                                   +----------+--------+--------+--------+------------+--------+ ECA       94      17                                   +----------+--------+--------+--------+------------+--------+ +----------+--------+-------+--------+-------------------+           PSV cm/sEDV cmsDescribeArm Pressure (mmHG) +----------+--------+-------+--------+-------------------+ Subclavian102                                        +----------+--------+-------+--------+-------------------+ +---------+--------+--+--------+--+ VertebralPSV cm/s58EDV cm/s21 +---------+--------+--+--------+--+  Left Carotid Findings: +----------+--------+--------+--------+------------+--------+           PSV cm/sEDV cm/sStenosisDescribe    Comments +----------+--------+--------+--------+------------+--------+ CCA Prox  78      19                                   +----------+--------+--------+--------+------------+--------+ CCA Distal77      31              heterogenous         +----------+--------+--------+--------+------------+--------+ ICA Prox  68      30              heterogenous         +----------+--------+--------+--------+------------+--------+  ICA Distal64      25                                   +----------+--------+--------+--------+------------+--------+ ECA       109     24                                   +----------+--------+--------+--------+------------+--------+ +----------+--------+--------+--------+-------------------+ SubclavianPSV cm/sEDV cm/sDescribeArm Pressure (mmHG) +----------+--------+--------+--------+-------------------+           75                                          +----------+--------+--------+--------+-------------------+ +---------+--------+--+--------+--+ VertebralPSV cm/s44EDV cm/s17 +---------+--------+--+--------+--+  Summary: Right Carotid: The extracranial vessels were near-normal with only minimal wall                thickening or plaque. Left Carotid: The extracranial vessels were near-normal with only minimal wall               thickening or plaque. Vertebrals:  Bilateral vertebral arteries demonstrate antegrade flow. Subclavians: Normal flow hemodynamics were seen in bilateral subclavian              arteries. *See table(s) above for measurements and observations.     Preliminary     Microbiology: No results found for this or any previous visit (from the past 240 hour(s)).   Labs: Basic Metabolic Panel: Recent Labs  Lab 04/29/18  2224 04/30/18 0538  NA 137  --   K 3.6  --   CL 100  --   CO2 22  --   GLUCOSE 202*  --   BUN 21*  --   CREATININE 1.37* 1.11*  CALCIUM 8.9  --    Liver Function Tests: Recent Labs  Lab 04/29/18 2224  AST 17  ALT 19  ALKPHOS 68  BILITOT 0.5  PROT 7.3  ALBUMIN 3.9   No results for input(s): LIPASE, AMYLASE in the last 168 hours. No results for input(s): AMMONIA in the last 168 hours. CBC: Recent Labs  Lab 04/29/18 2224 04/30/18 0538  WBC 7.1 7.2  NEUTROABS 3.9  --   HGB 13.6 12.5  HCT 41.7 38.0  MCV 88.2 87.4  PLT 298 256   Cardiac Enzymes: No results for input(s): CKTOTAL, CKMB, CKMBINDEX, TROPONINI in the  last 168 hours. BNP: BNP (last 3 results) No results for input(s): BNP in the last 8760 hours.  ProBNP (last 3 results) No results for input(s): PROBNP in the last 8760 hours.  CBG: Recent Labs  Lab 04/29/18 2217 04/30/18 1625  GLUCAP 187* 145*       Signed:  Fayrene Helper MD.  [facility] 04/30/2018, 6:24 PM

## 2018-04-30 NOTE — Progress Notes (Signed)
STROKE TEAM PROGRESS NOTE   HISTORY OF PRESENT ILLNESS (per record) [name] is an 58 y.o. female medical history of hypertension, hyperlipidemia, diabetes mellitus, obesity presents to the emergency room with slurred speech and left-sided weakness. Was last seen by her husband to be normal before he went to work.  Around 6 PM patient complained of severe headache and daughter went by and gave her some Tylenol.  Her headache did not improve and around 9:15 PM when the daughter talked to her over the phone she did not have slurred speech. However, husband on reaching home noticed she has having slurred speech and called EMS.  She was stroke alerted.  A stat CT head was performed which did not show any acute hemorrhage.  TPA was not administered as symptom onset was unclear, since her headache started at 6 PM this would be her last known normal and she was outside the window for TPA.  Her NIH stroke scale was 5.  No neglect, aphasia or vision changes noted.    Date last known well: 2.1.20 Time last known well: 6pm tPA Given: no, unclear time of onset of symptoms, last known well >4.5hrs NIHSS: 5 Baseline MRS 0   SUBJECTIVE (INTERVAL HISTORY) Her family is not at bedside. Patient says she has had several TIAs. Given headache she may have had a migraine. Discussed negative MRI. She has no deficits, resolved.    OBJECTIVE Vitals:   04/30/18 0100 04/30/18 0115 04/30/18 0149 04/30/18 0748  BP: 137/83 138/77 116/70 122/73  Pulse: 76 64 65 74  Resp: 17 19 18 19   Temp:   98.2 F (36.8 C) 98.4 F (36.9 C)  TempSrc:   Oral Oral  SpO2: 98% 98% 99% 98%  Weight:   101.6 kg   Height:   5\' 7"  (1.702 m)     CBC:  Recent Labs  Lab 04/29/18 2224 04/30/18 0538  WBC 7.1 7.2  NEUTROABS 3.9  --   HGB 13.6 12.5  HCT 41.7 38.0  MCV 88.2 87.4  PLT 298 272    Basic Metabolic Panel:  Recent Labs  Lab 04/29/18 2224 04/30/18 0538  NA 137  --   K 3.6  --   CL 100  --   CO2 22  --    GLUCOSE 202*  --   BUN 21*  --   CREATININE 1.37* 1.11*  CALCIUM 8.9  --     Lipid Panel:     Component Value Date/Time   CHOL 121 04/30/2018 0538   TRIG 161 (H) 04/30/2018 0538   HDL 35 (L) 04/30/2018 0538   CHOLHDL 3.5 04/30/2018 0538   VLDL 32 04/30/2018 0538   LDLCALC 54 04/30/2018 0538   HgbA1c:  Lab Results  Component Value Date   HGBA1C 6.8 (H) 04/30/2018   Urine Drug Screen:     Component Value Date/Time   LABOPIA NONE DETECTED 04/30/2018 0104   COCAINSCRNUR NONE DETECTED 04/30/2018 0104   LABBENZ NONE DETECTED 04/30/2018 0104   AMPHETMU NONE DETECTED 04/30/2018 0104   THCU NONE DETECTED 04/30/2018 0104   LABBARB NONE DETECTED 04/30/2018 0104    Alcohol Level     Component Value Date/Time   ETH <10 04/29/2018 2224    IMAGING   Mr Jodene Nam Head Wo Contrast 04/30/2018 IMPRESSION:   MRI HEAD:  1. No acute intracranial process on this mildly motion degraded examination.  2. Stable mild chronic small vessel ischemic changes.   MRA HEAD:  1. Negative motion degraded MRA  head.    Ct Head Code Stroke Wo Contrast 04/29/2018 IMPRESSION:  1. No acute intracranial process.  2. ASPECTS is 10.  3. Mild chronic small vessel ischemic changes.    Transthoracic Echocardiogram  00/00/00  1. The left ventricle has normal systolic function of 51-88%. The cavity size is normal. There is normal left ventricular wall thickness. Echo evidence of normal diastolic filling patterns.  2. Moderately dilated left atrial size.  3. Normal right atrial size.  4. No atrial level shunt detected by color flow Doppler.    Bilateral Carotid Dopplers  04/30/2018 Summary: Right Carotid: The extracranial vessels were near-normal with only minimal wall thickening or plaque. Left Carotid: The extracranial vessels were near-normal with only minimal wall thickening or plaque. Vertebrals:  Bilateral vertebral arteries demonstrate antegrade flow. Subclavians: Normal flow hemodynamics were  seen in bilateral subclavian arteries.    PHYSICAL EXAM Blood pressure 122/73, pulse 74, temperature 98.4 F (36.9 C), temperature source Oral, resp. rate 19, height 5\' 7"  (1.702 m), weight 101.6 kg, SpO2 98 %.  Exam: NAD, pleasant                  Speech:    Speech is normal; fluent and spontaneous with normal comprehension.  Cognition:    The patient is oriented to person, place, and time;     recent and remote memory intact;     language fluent;    Cranial Nerves:    The pupils are equal, round, and reactive to light.Trigeminal sensation is intact and the muscles of mastication are normal. The face is symmetric. The palate elevates in the midline. Hearing intact. Voice is normal. Shoulder shrug is normal. The tongue has normal motion without fasciculations.   Coordination:  No dysmetria  Motor Observation:    No asymmetry, no atrophy, and no involuntary movements noted. Tone:    Normal muscle tone.     Strength:    Strength is V/V in the upper and lower limbs.      Sensation: intact to LT       ASSESSMENT/PLAN [name] is a 58 y.o. female with history of hypertension, TIA hx, hyperlipidemia, hx of irregular heartbeats, diabetes mellitus, and obesity presenting with slurred speech, headache  and left-sided weakness.. She did not receive IV t-PA due to unknown time of onset.  Possible TIA vs migraine  Resultant  No deficits  CT head  - no acute findings.  MRI head  - no acute findings.  MRA head - Negative motion degraded MRA head.   CTA H&N - not performed  Carotid Doppler  - unremarkable  2D Echo - unremarkable   LDL - 54  HgbA1c - 6.8  UDS - negative  VTE prophylaxis - Lovenox 20 mg daily  Diet  - Carb modified with thin liquids  Plavix prior to admission, now on clopidogrel 75 mg daily. DUAP for 3 weeks(Plavix 75mg  and asa 81mg ) then plavix alone.  Patient counseled to be compliant with her antithrombotic medications  Ongoing  aggressive stroke risk factor management  Therapy recommendations:  No OT or PT F/U recommended  Disposition:  Pending  Hypertension  Stable . Permissive hypertension (OK if < 220/120) but gradually normalize in 5-7 days . Long-term BP goal normotensive  Hyperlipidemia  Lipid lowering medication PTA: Pravachol 20 mg daily.  LDL 54, goal < 70  Current lipid lowering medication: Pravachol 20 mg daily  Continue statin at discharge  Diabetes  HgbA1c 6.8, goal < 7.0  Controlled  Other Stroke Risk Factors  Obesity, Body mass index is 35.08 kg/m., recommend weight loss, diet and exercise as appropriate   Family hx stroke (father)  TIA history  Screen for sleep apnea   Other Active Problems  Creatinine 1.37 -> 1.11   Plan  DUAP for 3 weeks(Plavix 75mg  and asa 81mg ) then plavix alone. Consider migraine as a cause. Stroke team will sign off.   Hospital day # 1  Personally examined patient and images, and have participated in and made any corrections needed to history, physical, neuro exam,assessment and plan as stated above.  I have personally obtained the history, evaluated lab date, reviewed imaging studies and agree with radiology interpretations.    Sarina Ill, MD Stroke Neurology   A total of 25 minutes was spent for the care of this patient, spent on counseling patient and family on different diagnostic and therapeutic options, counseling and coordination of care, riskd ans benefits of management, compliance, or risk factor reduction and education.   To contact Stroke Continuity provider, please refer to [email or web address] After hours, contact General Neurology

## 2018-04-30 NOTE — Progress Notes (Signed)
VASCULAR LAB PRELIMINARY  PRELIMINARY  PRELIMINARY  PRELIMINARY  Carotid duplex completed.    Preliminary report:  See CV Proc  Serrita Lueth, RVT 04/30/2018, 11:23 AM

## 2018-04-30 NOTE — Plan of Care (Signed)
  Problem: General Medical Path Diagnosis Goal: Knowledge of disease or condition and therapeutic regimen will improve 04/30/2018 0302 by Georjean Mode, RN Outcome: Progressing 04/30/2018 0251 by Georjean Mode, RN Outcome: Progressing Goal: Understanding of disease or condition will improve Outcome: Progressing Goal: Ability to adjust to condition or change in health will improve Outcome: Progressing   Problem: Ischemic Stroke/TIA Tissue Perfusion: Goal: Knowledge of disease or condition and therapeutic regimen will improve Outcome: Progressing

## 2018-04-30 NOTE — ED Notes (Signed)
Pt returned from MRI, complain of pain from having to urinate and not being able to .  RN in/out cath w/ tec to relieve pressure. 700cc of urine obtained.  Pain relieved.

## 2018-04-30 NOTE — Progress Notes (Signed)
  Echocardiogram 2D Echocardiogram has been performed.  [name] 04/30/2018, 11:46 AM

## 2018-05-15 ENCOUNTER — Ambulatory Visit
Admission: RE | Admit: 2018-05-15 | Discharge: 2018-05-15 | Disposition: A | Discharge status: Home / Self Care | Payer: BC Managed Care – PPO | Source: Ambulatory Visit | Attending: Family Medicine | Admitting: Family Medicine

## 2018-05-15 ENCOUNTER — Other Ambulatory Visit: Payer: Self-pay | Admitting: Family Medicine

## 2018-05-15 DIAGNOSIS — M545 Low back pain, unspecified: Secondary | ICD-10-CM

## 2018-05-15 DIAGNOSIS — R262 Difficulty in walking, not elsewhere classified: Secondary | ICD-10-CM

## 2018-10-25 ENCOUNTER — Other Ambulatory Visit: Payer: Self-pay | Admitting: Interventional Radiology

## 2018-10-25 DIAGNOSIS — N289 Disorder of kidney and ureter, unspecified: Secondary | ICD-10-CM

## 2018-11-01 ENCOUNTER — Ambulatory Visit
Admission: RE | Admit: 2018-11-01 | Discharge: 2018-11-01 | Disposition: A | Discharge status: Home / Self Care | Payer: BC Managed Care – PPO | Source: Ambulatory Visit | Attending: Interventional Radiology | Admitting: Interventional Radiology

## 2018-11-01 ENCOUNTER — Other Ambulatory Visit: Payer: Self-pay

## 2018-11-01 ENCOUNTER — Encounter: Payer: Self-pay | Admitting: *Deleted

## 2018-11-01 DIAGNOSIS — N289 Disorder of kidney and ureter, unspecified: Secondary | ICD-10-CM

## 2018-11-01 HISTORY — PX: IR RADIOLOGIST EVAL & MGMT: IMG5224

## 2018-11-01 NOTE — Progress Notes (Signed)
Patient ID: [name], female   DOB: 1960/07/18, 58 y.o.   MRN: [id number]         Chief Complaint: Post right-sided renal cryoablation (08/05/2017  Referring Physician(s): Bell (Urology)  History of Present Illness: [name] is a 58 y.o. female with past medical history significant for diabetes, hypertension, hyperlipidemia, thyroid cancer who underwent image guided right renal cryoablation on 08/05/2017. She is seen today in telemedicine consult from the acquisition of postprocedural surveillance CT scan performed 10/19/2018  Patient reports right flank asymmetry and fullness, though I explained that this subjective feeling is not associated with her tiny right-sided renal lesion and there is no asymmetry in the subcutaneous tissues about her right flank.  Patient is otherwise without complaint.    Past Medical History:  Diagnosis Date  . Anemia    hx of   . 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 Asthma    due to allergies   . Cancer ([place])   . Complication of anesthesia   . Diabetes mellitus   . Dyspnea    increased exertion   . Dysrhythmia    " irregular heart rate per patient"  . Family history of adverse reaction to anesthesia    difficult for father to wake after surgery  . Heart murmur   . Hypercholesteremia   . Hypertension   . PONV (postoperative nausea and vomiting)   . Thyroid disease   . TIA (transient ischemic attack)     Past Surgical History:  Procedure Laterality Date  . ABDOMINAL HYSTERECTOMY    . CHOLECYSTECTOMY    . COLON SURGERY    . IR RADIOLOGIST EVAL & MGMT  01/12/2017  . IR RADIOLOGIST EVAL & MGMT  06/16/2017  . IR RADIOLOGIST EVAL & MGMT  08/31/2017  . IR RADIOLOGIST EVAL & MGMT  12/13/2017  . IR RADIOLOGIST EVAL & MGMT  03/16/2018  . KNEE ARTHROSCOPY    . LEFT HEART CATHETERIZATION WITH CORONARY ANGIOGRAM N/A 04/24/2014   Procedure: LEFT HEART CATHETERIZATION WITH CORONARY ANGIOGRAM;  Surgeon: Burnell Blanks, MD;  Location: [facility] CATH  LAB;  Service: Cardiovascular;  Laterality: N/A;  . RADIOFREQUENCY ABLATION Right 08/05/2017   Procedure: CT RENAL CRYO AND BIOPSY;  Surgeon: Sandi Mariscal, MD;  Location: [facility] ORS;  Service: Anesthesiology;  Laterality: Right;  . THYROIDECTOMY N/A 09/20/2014   Procedure: TOTAL THYROIDECTOMY;  Surgeon: Armandina Gemma, MD;  Location: [facility] ORS;  Service: General;  Laterality: N/A;  . TONSILLECTOMY      Allergies: Lorabid [loracarbef], Augmentin [amoxicillin-pot clavulanate], Codeine, Gadolinium derivatives, Sulfa drugs cross reactors, Benicar [olmesartan medoxomil], and Other  Medications: Prior to Admission medications   Medication Sig Start Date End Date Taking? Authorizing Provider  acetaminophen (TYLENOL) 500 MG tablet Take 2 tablets (1,000 mg total) by mouth every 6 (six) hours as needed (body aches). 06/29/17   Geradine Girt, DO  albuterol (PROVENTIL HFA;VENTOLIN HFA) 108 (90 Base) MCG/ACT inhaler Inhale 1-2 puffs into the lungs every 6 (six) hours as needed for wheezing or shortness of breath. 12/28/17   Jean Rosenthal, MD  calcium-vitamin D (OSCAL WITH D) 500-200 MG-UNIT tablet Take 2 tablets by mouth 3 (three) times daily. 05/17/16   Geradine Girt, DO  clopidogrel (PLAVIX) 75 MG tablet Take 1 tablet (75 mg total) by mouth daily. 09/15/15   Florencia Reasons, MD  diphenhydrAMINE (BENADRYL) 25 MG tablet Take 25-50 mg by mouth at bedtime.     [provider]  furosemide (LASIX)  40 MG tablet Take 2 tablets (80 mg total) by mouth 2 (two) times daily. 05/17/16   Geradine Girt, DO  JANUMET 50-1000 MG tablet Take 1 tablet by mouth 2 (two) times daily. 09/16/17   [provider]  lansoprazole (PREVACID) 15 MG capsule Take 1 capsule (15 mg total) by mouth daily at 12 noon. 03/07/15   Little, Wenda Overland, MD  levothyroxine (SYNTHROID, LEVOTHROID) 300 MCG tablet Take 300 mcg by mouth daily before breakfast.    [provider]  lisinopril (PRINIVIL,ZESTRIL) 10 MG tablet Take 10 mg by mouth  daily.  09/09/14   [provider]  metoprolol tartrate (LOPRESSOR) 100 MG tablet Take 100 mg by mouth 2 (two) times daily. 06/23/16   [provider]  ondansetron (ZOFRAN ODT) 4 MG disintegrating tablet Take 1 tablet (4 mg total) by mouth every 6 (six) hours as needed. Patient taking differently: Take 4 mg by mouth every 6 (six) hours as needed for nausea or vomiting.  03/04/18   Ward, Delice Bison, DO  potassium chloride SA (K-DUR,KLOR-CON) 20 MEQ tablet Take 40 mEq by mouth 2 (two) times daily.     [provider]  pravastatin (PRAVACHOL) 20 MG tablet Take 20 mg by mouth daily.  06/25/13   [provider]  Probiotic Product (PROBIOTIC PO) Take 1 tablet by mouth daily.    [provider]  traMADol (ULTRAM) 50 MG tablet Take 1 tablet (50 mg total) by mouth every 6 (six) hours as needed. Patient taking differently: Take 50 mg by mouth every 6 (six) hours as needed for moderate pain.  09/21/17   Petrucelli, Glynda Jaeger, PA-C     Family History  Problem Relation Age of Onset  . Heart failure Mother   . Diabetes Mother   . Stroke Father   . Heart failure Father   . Cancer Father     Social History   Socioeconomic History  . Marital status: Married    Spouse name: Glendell Docker  . Number of children: 2  . Years of education: 26  . Highest education level: Not on file  Occupational History    Comment: NA, disabled  Social Needs  . Financial resource strain: Not on file  . Food insecurity    Worry: Not on file    Inability: Not on file  . Transportation needs    Medical: Not on file    Non-medical: Not on file  Tobacco Use  . Smoking status: Never Smoker  . Smokeless tobacco: Never Used  Substance and Sexual Activity  . Alcohol use: No  . Drug use: No  . Sexual activity: Not on file  Lifestyle  . Physical activity    Days per week: Not on file    Minutes per session: Not on file  . Stress: Not on file  Relationships  . Social Product manager on phone: Not on file    Gets together: Not on file    Attends religious service: Not on file    Active member of club or organization: Not on file    Attends meetings of clubs or organizations: Not on file    Relationship status: Not on file  Other Topics Concern  . Not on file  Social History Narrative   Lives with husband and son    ECOG Status: 1 - Symptomatic but completely ambulatory  Review of Systems  Review of Systems: A 12 point ROS discussed and pertinent positives are indicated in  the HPI above.  All other systems are negative.  Physical Exam No direct physical exam was performed (except for noted visual exam findings with Video Visits).   Vital Signs: There were no vitals taken for this visit.  Imaging:  Selected images from CT scan abdomen pelvis performed 10/19/2018; 03/15/2018; 09/21/2017; abdominal MRI-12/13/2017; 06/13/2017; CT-guided right-sided renal cryoablation-08/05/2017   Labs:  CBC: Recent Labs    12/28/17 0907 03/03/18 2145 04/29/18 2224 04/30/18 0538  WBC 6.9 7.8 7.1 7.2  HGB 13.3 12.7 13.6 12.5  HCT 41.8 41.6 41.7 38.0  PLT 207 305 298 256    COAGS: Recent Labs    04/29/18 2224  INR 1.00  APTT 28    BMP: Recent Labs    12/28/17 0907 03/03/18 2145 04/29/18 2224 04/30/18 0538  NA 137 140 137  --   K 3.8 4.3 3.6  --   CL 101 104 100  --   CO2 22 23 22   --   GLUCOSE 237* 177* 202*  --   BUN 21* 16 21*  --   CALCIUM 8.6* 8.5* 8.9  --   CREATININE 1.35* 1.48* 1.37* 1.11*  GFRNONAA 43* 39* 43* 55*  GFRAA 50* 46* 50* >60    LIVER FUNCTION TESTS: Recent Labs    04/29/18 2224  BILITOT 0.5  AST 17  ALT 19  ALKPHOS 68  PROT 7.3  ALBUMIN 3.9    TUMOR MARKERS: No results for input(s): AFPTM, CEA, CA199, CHROMGRNA in the last 8760 hours.  Assessment and Plan:  Alyla Pietila is a 58 y.o. female with past medical history significant for diabetes, hypertension, hyperlipidemia, thyroid cancer who underwent image guided  right renal cryoablation on 08/05/2017.  Patient is without specific complaint.  Selected images from CT scan abdomen pelvis performed 10/19/2018; 03/15/2018; 09/21/2017; abdominal MRI-12/13/2017; 06/13/2017 and CT-guided right-sided renal cryoablation-08/05/2017 were reviewed in detail.  Note, CT scans are being utilized for surveillance purposes given patient's history of questionable reaction to MRI contrast.  Most recent CT scan to demonstrates ill-defined nodular enhancing tissue adjacent to the ablation site involving the superior pole of the right kidney, currently measuring approximately 1.8 x 1.6 x 1.6 cm (coronal image 102, series 600) axial image 72, series 5), previously, 1.6 x 0.9 x 1.1 cm when compared to the 02/2018 examination and now demonstrating more convincing evidence of residual disease.  Stable appearance of cryoablation site involving the posterior interpolar aspect of the right kidney without evidence of residual or locally recurrent disease at this location.  Potential treatment options were again discussed at length with the patient including the following:  - Continued surveillance imaging with repeat CT scan in 6 months (though given increased in size and more convincing evidence of residual disease intervention is likely warranted. - Proceeding with repeat image guided renal cryoablation. - Referral back to Dr. Gloriann Loan for reconsideration of nephrectomy/partial nephrectomy, thought I am doubtful the patient will be an operative candidate given medical comorbidities.  Following prolonged a detailed conversation, patient wishes to pursue repeat right-sided renal cryoablation.  As such, the procedure will be scheduled at [facility] with general anesthesia.  The procedure will again entail an overnight admission for continued observation and PCA usage.  I explained that given the COVID pandemic, it is unlikely a family member will be able to stay with her during her  admission to the hospital.  Plan: - Repeat right-sided renal cryoablation for residual/locally recurrent disease involving the superior pole of the kidney.  The  patient knows to call the interventional radiology clinic with any interval questions or concerns  A copy of this report was sent to the requesting provider on this date.  Electronically Signed: Sandi Mariscal 11/01/2018, 9:49 AM   I spent a total of 15 Minutes in remote  clinical consultation, greater than 50% of which was counseling/coordinating care for right sided renal cryoablation.    Visit type: Audio only (telephone). Audio (no video) only due to patient's lack of internet/smartphone capability. Alternative for in-person consultation at [facility], [address] [place], [place], [place]. This visit type was conducted due to national recommendations for restrictions regarding the COVID-19 Pandemic (e.g. social distancing).  This format is felt to be most appropriate for this patient at this time.  All issues noted in this document were discussed and addressed.

## 2018-11-07 ENCOUNTER — Other Ambulatory Visit (HOSPITAL_COMMUNITY): Payer: Self-pay | Admitting: Interventional Radiology

## 2018-11-07 DIAGNOSIS — N2889 Other specified disorders of kidney and ureter: Secondary | ICD-10-CM

## 2018-11-09 NOTE — Patient Instructions (Addendum)
YOU NEED TO HAVE A COVID 19 TEST ON 11-11-2018 AT 1135 AM, THIS TEST MUST BE DONE BEFORE SURGERY, COME  [address], [place] , [address]. ONCE YOUR COVID TEST IS COMPLETED, PLEASE BEGIN THE QUARANTINE INSTRUCTIONS AS OUTLINED IN YOUR HANDOUT.                Crista Nuon     Your procedure is scheduled on: 11-15-2018  Report to [facility]  Entrance  Report to RADIOLOGY  At 700 AM   1 VISITOR IS ALLOWED TO WAIT IN WAITING ROOM  ONLY DAY OF YOUR SURGERY.    Call this number if you have problems the morning of surgery [phone]    Remember: Do not eat food or drink liquids :After Midnight. BRUSH YOUR TEETH MORNING OF SURGERY AND RINSE YOUR MOUTH OUT, NO CHEWING GUM CANDY OR MINTS.     Take these medicines the morning of surgery with A SIP OF WATER: ALBUTEROL INHALER IF NEEDED AND BRING INHALER, PRAVASTATIN, METOPROLOL TARTRATE, LEVOTHYROXINE (SYNTHROID), OMEPRAZOLE (PRILOSEC)  DO NOT TAKE ANY DIABETIC MEDICATIONS DAY OF YOUR SURGERY        How to Manage Your Diabetes Before and After Surgery  Why is it important to control my blood sugar before and after surgery? . Improving blood sugar levels before and after surgery helps healing and can limit problems. . A way of improving blood sugar control is eating a healthy diet by: o  Eating less sugar and carbohydrates o  Increasing activity/exercise o  Talking with your doctor about reaching your blood sugar goals . High blood sugars (greater than 180 mg/dL) can raise your risk of infections and slow your recovery, so you will need to focus on controlling your diabetes during the weeks before surgery. . Make sure that the doctor who takes care of your diabetes knows about your planned surgery including the date and location.  How do I manage my blood sugar before surgery? . Check your blood sugar at least 4 times a day, starting 2 days before surgery, to make sure that the level is not too high or low. o Check  your blood sugar the morning of your surgery when you wake up and every 2 hours until you get to the Short Stay unit. . If your blood sugar is less than 70 mg/dL, you will need to treat for low blood sugar: o Do not take insulin. o Treat a low blood sugar (less than 70 mg/dL) with  cup of clear juice (cranberry or apple), 4 glucose tablets, OR glucose gel. o Recheck blood sugar in 15 minutes after treatment (to make sure it is greater than 70 mg/dL). If your blood sugar is not greater than 70 mg/dL on recheck, call [phone] for further instructions. . Report your blood sugar to the short stay nurse when you get to Short Stay.  . If you are admitted to the hospital after surgery: o Your blood sugar will be checked by the staff and you will probably be given insulin after surgery (instead of oral diabetes medicines) to make sure you have good blood sugar levels. o The goal for blood sugar control after surgery is 80-180 mg/dL.   WHAT DO I DO ABOUT MY DIABETES MEDICATION?  Marland Kitchen Do not take oral diabetes medicines (pills) the morning of surgery.  . THE DAY BEFORE SURGERY TAKE JANUMET AS USUAL.       . THE MORNING OF SURGERY DO NOT TAKE JANUMET.  You may not have any metal on your body including hair pins and              piercings  Do not wear jewelry, make-up, lotions, powders or perfumes, deodorant             Do not wear nail polish.  Do not shave  48 hours prior to surgery.              Men may shave face and neck.   Do not bring valuables to the hospital. [place].  Contacts, dentures or bridgework may not be worn into surgery.  Leave suitcase in the car. After surgery it may be brought to your room.     Patients discharged the day of surgery will not be allowed to drive home. IF YOU ARE HAVING SURGERY AND GOING HOME THE SAME DAY, YOU MUST HAVE AN ADULT TO DRIVE YOU HOME AND BE WITH YOU FOR 24 HOURS. YOU MAY GO  HOME BY TAXI OR UBER OR ORTHERWISE, BUT AN ADULT MUST ACCOMPANY YOU HOME AND STAY WITH YOU FOR 24 HOURS.  Name and phone number of your driver: PATIENT SPENDING NIGHT, TIFFANY IN IR NOTIFIED  Special Instructions: N/A              Please read over the following fact sheets you were given: _____________________________________________________________________             [facility] - Preparing for Surgery Before surgery, you can play an important role.  Because skin is not sterile, your skin needs to be as free of germs as possible.  You can reduce the number of germs on your skin by washing with CHG (chlorahexidine gluconate) soap before surgery.  CHG is an antiseptic cleaner which kills germs and bonds with the skin to continue killing germs even after washing. Please DO NOT use if you have an allergy to CHG or antibacterial soaps.  If your skin becomes reddened/irritated stop using the CHG and inform your nurse when you arrive at Short Stay. Do not shave (including legs and underarms) for at least 48 hours prior to the first CHG shower.  You may shave your face/neck. Please follow these instructions carefully:  1.  Shower with CHG Soap the night before surgery and the  morning of Surgery.  2.  If you choose to wash your hair, wash your hair first as usual with your  normal  shampoo.  3.  After you shampoo, rinse your hair and body thoroughly to remove the  shampoo.                           4.  Use CHG as you would any other liquid soap.  You can apply chg directly  to the skin and wash                       Gently with a scrungie or clean washcloth.  5.  Apply the CHG Soap to your body ONLY FROM THE NECK DOWN.   Do not use on face/ open                           Wound or open sores. Avoid contact with eyes, ears mouth and genitals (private parts).  Wash face,  Genitals (private parts) with your normal soap.             6.  Wash thoroughly, paying special attention to the  area where your surgery  will be performed.  7.  Thoroughly rinse your body with warm water from the neck down.  8.  DO NOT shower/wash with your normal soap after using and rinsing off  the CHG Soap.                9.  Pat yourself dry with a clean towel.            10.  Wear clean pajamas.            11.  Place clean sheets on your bed the night of your first shower and do not  sleep with pets. Day of Surgery : Do not apply any lotions/deodorants the morning of surgery.  Please wear clean clothes to the hospital/surgery center.  FAILURE TO FOLLOW THESE INSTRUCTIONS MAY RESULT IN THE CANCELLATION OF YOUR SURGERY PATIENT SIGNATURE_________________________________  NURSE SIGNATURE__________________________________  ________________________________________________________________________

## 2018-11-09 NOTE — Progress Notes (Addendum)
EKG 04-29-18 Epic ECHO 04-30-18 Epic LOV DR [place] AND VASCULAR 08-28-17 CARE EVERYWHERE

## 2018-11-10 ENCOUNTER — Other Ambulatory Visit (HOSPITAL_COMMUNITY): Payer: BC Managed Care – PPO

## 2018-11-10 ENCOUNTER — Other Ambulatory Visit: Payer: Self-pay | Admitting: Radiology

## 2018-11-10 ENCOUNTER — Encounter (HOSPITAL_COMMUNITY): Payer: Self-pay

## 2018-11-10 ENCOUNTER — Other Ambulatory Visit: Payer: Self-pay

## 2018-11-10 ENCOUNTER — Encounter (HOSPITAL_COMMUNITY)
Admission: RE | Admit: 2018-11-10 | Discharge: 2018-11-10 | Disposition: A | Discharge status: Home / Self Care | Payer: Medicare Other | Source: Ambulatory Visit | Attending: Interventional Radiology | Admitting: Interventional Radiology

## 2018-11-10 DIAGNOSIS — M19079 Primary osteoarthritis, unspecified ankle and foot: Secondary | ICD-10-CM | POA: Diagnosis not present

## 2018-11-10 DIAGNOSIS — I2510 Atherosclerotic heart disease of native coronary artery without angina pectoris: Secondary | ICD-10-CM | POA: Insufficient documentation

## 2018-11-10 DIAGNOSIS — E119 Type 2 diabetes mellitus without complications: Secondary | ICD-10-CM | POA: Diagnosis not present

## 2018-11-10 DIAGNOSIS — E7800 Pure hypercholesterolemia, unspecified: Secondary | ICD-10-CM | POA: Insufficient documentation

## 2018-11-10 DIAGNOSIS — Z85850 Personal history of malignant neoplasm of thyroid: Secondary | ICD-10-CM | POA: Insufficient documentation

## 2018-11-10 DIAGNOSIS — N2889 Other specified disorders of kidney and ureter: Secondary | ICD-10-CM | POA: Diagnosis not present

## 2018-11-10 DIAGNOSIS — Z79899 Other long term (current) drug therapy: Secondary | ICD-10-CM | POA: Diagnosis not present

## 2018-11-10 DIAGNOSIS — J45909 Unspecified asthma, uncomplicated: Secondary | ICD-10-CM | POA: Insufficient documentation

## 2018-11-10 DIAGNOSIS — Z7902 Long term (current) use of antithrombotics/antiplatelets: Secondary | ICD-10-CM | POA: Diagnosis not present

## 2018-11-10 DIAGNOSIS — Z85528 Personal history of other malignant neoplasm of kidney: Secondary | ICD-10-CM | POA: Diagnosis not present

## 2018-11-10 DIAGNOSIS — Z8673 Personal history of transient ischemic attack (TIA), and cerebral infarction without residual deficits: Secondary | ICD-10-CM | POA: Diagnosis not present

## 2018-11-10 DIAGNOSIS — Z20828 Contact with and (suspected) exposure to other viral communicable diseases: Secondary | ICD-10-CM | POA: Insufficient documentation

## 2018-11-10 DIAGNOSIS — Z79890 Hormone replacement therapy (postmenopausal): Secondary | ICD-10-CM | POA: Diagnosis not present

## 2018-11-10 DIAGNOSIS — I10 Essential (primary) hypertension: Secondary | ICD-10-CM | POA: Insufficient documentation

## 2018-11-10 DIAGNOSIS — E079 Disorder of thyroid, unspecified: Secondary | ICD-10-CM | POA: Diagnosis not present

## 2018-11-10 DIAGNOSIS — Z01812 Encounter for preprocedural laboratory examination: Secondary | ICD-10-CM | POA: Insufficient documentation

## 2018-11-10 LAB — CBC
HCT: 39.9 % (ref 36.0–46.0)
Hemoglobin: 12.5 g/dL (ref 12.0–15.0)
MCH: 29.5 pg (ref 26.0–34.0)
MCHC: 31.3 g/dL (ref 30.0–36.0)
MCV: 94.1 fL (ref 80.0–100.0)
Platelets: 228 10*3/uL (ref 150–400)
RBC: 4.24 MIL/uL (ref 3.87–5.11)
RDW: 13.2 % (ref 11.5–15.5)
WBC: 5.8 10*3/uL (ref 4.0–10.5)
nRBC: 0 % (ref 0.0–0.2)

## 2018-11-10 LAB — BASIC METABOLIC PANEL
Anion gap: 11 (ref 5–15)
BUN: 21 mg/dL — ABNORMAL HIGH (ref 6–20)
CO2: 27 mmol/L (ref 22–32)
Calcium: 8.6 mg/dL — ABNORMAL LOW (ref 8.9–10.3)
Chloride: 100 mmol/L (ref 98–111)
Creatinine, Ser: 1.29 mg/dL — ABNORMAL HIGH (ref 0.44–1.00)
GFR calc Af Amer: 54 mL/min — ABNORMAL LOW (ref >60)
GFR calc non Af Amer: 46 mL/min — ABNORMAL LOW (ref >60)
Glucose, Bld: 262 mg/dL — ABNORMAL HIGH (ref 70–99)
Potassium: 4.3 mmol/L (ref 3.5–5.1)
Sodium: 138 mmol/L (ref 135–145)

## 2018-11-10 LAB — HEMOGLOBIN A1C
Hgb A1c MFr Bld: 8.1 % — ABNORMAL HIGH (ref 4.8–5.6)
Mean Plasma Glucose: 185.77 mg/dL

## 2018-11-10 LAB — GLUCOSE, CAPILLARY: Glucose-Capillary: 235 mg/dL — ABNORMAL HIGH (ref 70–99)

## 2018-11-10 NOTE — Progress Notes (Addendum)
PCP: betti reese  CARDIOLOGIST:dr alvarez   INFO IN Epic:ekg 04-29-18, echo 04-30-18 epic, lov dr Philbert Riser heart and vascular 08-28-17 care everywhere cbc, bmet hemaglobin a1c (8.1) 11-10-18  INFO ON CHART:  BLOOD THINNERS AND LAST DOSES plavix stop x 5 days per dr watts.  ____________________________________  PATIENT SYMPTOMS AT TIME OF PREOP:none

## 2018-11-11 ENCOUNTER — Other Ambulatory Visit (HOSPITAL_COMMUNITY)
Admission: RE | Admit: 2018-11-11 | Discharge: 2018-11-11 | Disposition: A | Discharge status: Home / Self Care | Payer: Medicare Other | Source: Ambulatory Visit | Attending: Interventional Radiology | Admitting: Interventional Radiology

## 2018-11-11 DIAGNOSIS — Z01812 Encounter for preprocedural laboratory examination: Secondary | ICD-10-CM | POA: Diagnosis not present

## 2018-11-11 LAB — SARS CORONAVIRUS 2 (TAT 6-24 HRS): SARS Coronavirus 2: NEGATIVE

## 2018-11-13 ENCOUNTER — Other Ambulatory Visit: Payer: Self-pay | Admitting: Radiology

## 2018-11-14 NOTE — Anesthesia Preprocedure Evaluation (Addendum)
Anesthesia Evaluation  Patient identified by MRN, date of birth, ID band Patient awake    Reviewed: Allergy & Precautions, NPO status , Patient's Chart, lab work & pertinent test results  History of Anesthesia Complications (+) PONVNegative for: history of anesthetic complications  Airway Mallampati: II  TM Distance: >3 FB Neck ROM: Full    Dental  (+) Poor Dentition   Pulmonary asthma ,    Pulmonary exam normal        Cardiovascular hypertension, Pt. on home beta blockers and Pt. on medications + CAD (nonobstructive, 2016)  Normal cardiovascular exam     Neuro/Psych TIAnegative psych ROS   GI/Hepatic Neg liver ROS, GERD  ,  Endo/Other  diabetes, Type 2Hypothyroidism Morbid obesity  Renal/GU Renal InsufficiencyRenal disease (renal mass)  negative genitourinary   Musculoskeletal negative musculoskeletal ROS (+)   Abdominal   Peds  Hematology negative hematology ROS (+)   Anesthesia Other Findings Echo 04/30/18: EF 60-65%, mod LAD  Reproductive/Obstetrics                           Anesthesia Physical Anesthesia Plan  ASA: III  Anesthesia Plan: General   Post-op Pain Management:    Induction: Intravenous  PONV Risk Score and Plan: 4 or greater and Ondansetron, Dexamethasone, Treatment may vary due to age or medical condition and Midazolam  Airway Management Planned: Oral ETT  Additional Equipment: None  Intra-op Plan:   Post-operative Plan: Extubation in OR  Informed Consent: I have reviewed the patients History and Physical, chart, labs and discussed the procedure including the risks, benefits and alternatives for the proposed anesthesia with the patient or authorized representative who has indicated his/her understanding and acceptance.     Dental advisory given  Plan Discussed with:   Anesthesia Plan Comments: (See PAT note 11/10/2018, Konrad Felix, PA-C)       Anesthesia Quick Evaluation

## 2018-11-14 NOTE — Progress Notes (Signed)
Anesthesia Chart Review   Case: [id number] Date/Time: 11/15/18 0815   Procedure: RIGHT RENAL CRYOABLATION (Right )   Anesthesia type: General   Pre-op diagnosis: RENAL MASS   [facility]   Surgeon: Sandi Mariscal, MD      DISCUSSION:58 y.o. 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 Sandi Mariscal.    Elevated blood glucose at PAT, glucose 262.  Will evaluate DOS.   Anticipate pt can proceed with planned procedure barring acute status change.    VS: BP (!) 148/92   Pulse 70   Temp 37.2 C (Oral)   Resp 18   Ht 5\' 4"  (1.626 m)   Wt 113.4 kg   SpO2 99%   BMI 42.91 kg/m   PROVIDERS: Lin Landsman, MD is PCP   Denton Lank, Kirk Ruths, MD is Endocrinologist last seen 09/15/2018, stable at this visit  Alphia Moh, MD is Cardiologist last seen 09/14/2017, with 2 year follow up recommended.   LABS: Labs reviewed: Acceptable for surgery. (all labs ordered are listed, but only abnormal results are displayed)  Labs Reviewed  HEMOGLOBIN A1C - Abnormal; Notable for the following components:      Result Value   Hgb A1c MFr Bld 8.1 (*)    All other components within normal limits  BASIC METABOLIC PANEL - Abnormal; Notable for the following components:   Glucose, Bld 262 (*)    BUN 21 (*)    Creatinine, Ser 1.29 (*)    Calcium 8.6 (*)    GFR calc non Af Amer 46 (*)    GFR calc Af Amer 54 (*)    All other components within normal limits  GLUCOSE, CAPILLARY - Abnormal; Notable for the following components:   Glucose-Capillary 235 (*)    All other components within normal limits  CBC     IMAGES:   EKG: 05/01/2018 Rate 75 bpm Sinus rhythm  LAFB Probable anteroseptal infarct, old Baseline wander in lead(s) II III aVF  CV: Echo 04/30/2018 IMPRESSIONS   1. The left ventricle has normal systolic function of 44-81%. The cavity size  is normal. There is normal left ventricular wall thickness. Echo evidence of normal diastolic filling patterns.  2. Moderately dilated left atrial size.  3. Normal right atrial size.  4. No atrial level shunt detected by color flow Doppler. Past Medical History:  Diagnosis Date  . Anemia    hx of   . 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 Asthma    due to allergies   . Cancer ([place])    THRYOID 2016 [place] WITH SURGERY, KIDNEY CANCER MAY 2018  RIGHT RENAL CRYOABLATION  . Complication of anesthesia   . Diabetes mellitus    TYPE 2  . Dyspnea    increased exertion   . Dysrhythmia    " irregular heart rate per patient"  . Family history of adverse reaction to anesthesia    difficult for father to wake after surgery, AFTHER LOW BP WITH ANESTHESIA  . Heart murmur   . Hypercholesteremia   . Hypertension   . PONV (postoperative nausea and vomiting)   . Thyroid disease   . TIA (transient ischemic attack)    LAST TIA  APRIL 2020 ON PLAVIX    Past Surgical History:  Procedure Laterality Date  . ABDOMINAL HYSTERECTOMY     COMPLETE  .  CHOLECYSTECTOMY    . COLON SURGERY    . IR RADIOLOGIST EVAL & MGMT  01/12/2017  . IR RADIOLOGIST EVAL & MGMT  06/16/2017  . IR RADIOLOGIST EVAL & MGMT  08/31/2017  . IR RADIOLOGIST EVAL & MGMT  12/13/2017  . IR RADIOLOGIST EVAL & MGMT  03/16/2018  . IR RADIOLOGIST EVAL & MGMT  11/01/2018  . KNEE ARTHROSCOPY    . LEFT HEART CATHETERIZATION WITH CORONARY ANGIOGRAM N/A 04/24/2014   Procedure: LEFT HEART CATHETERIZATION WITH CORONARY ANGIOGRAM;  Surgeon: Burnell Blanks, MD;  Location: [facility] CATH LAB;  Service: Cardiovascular;  Laterality: N/A;  . RADIOFREQUENCY ABLATION Right 08/05/2017   Procedure: CT RENAL CRYO AND BIOPSY;  Surgeon: Sandi Mariscal, MD;  Location: [facility];  Service: Anesthesiology;  Laterality: Right;  . THYROIDECTOMY N/A 09/20/2014   Procedure: TOTAL THYROIDECTOMY;  Surgeon: Armandina Gemma, MD;  Location: [facility];  Service: General;  Laterality: N/A;  .  TONSILLECTOMY      MEDICATIONS: . calcium carbonate (OSCAL) 1500 (600 Ca) MG TABS tablet  . clopidogrel (PLAVIX) 75 MG tablet  . acetaminophen (TYLENOL) 500 MG tablet  . albuterol (PROVENTIL HFA;VENTOLIN HFA) 108 (90 Base) MCG/ACT inhaler  . b complex vitamins tablet  . diphenhydrAMINE (BENADRYL) 25 MG tablet  . furosemide (LASIX) 40 MG tablet  . ibuprofen (ADVIL) 800 MG tablet  . JANUMET 50-1000 MG tablet  . levothyroxine (SYNTHROID, LEVOTHROID) 300 MCG tablet  . lisinopril (PRINIVIL,ZESTRIL) 10 MG tablet  . metoprolol tartrate (LOPRESSOR) 100 MG tablet  . Multiple Vitamin (MULTIVITAMIN PO)  . Nutritional Supplements (ESTROVEN MAXIMUM STRENGTH PO)  . omeprazole (PRILOSEC OTC) 20 MG tablet  . ondansetron (ZOFRAN) 8 MG tablet  . potassium chloride SA (K-DUR,KLOR-CON) 20 MEQ tablet  . pravastatin (PRAVACHOL) 20 MG tablet  . Probiotic Product (PROBIOTIC PO)  . zinc gluconate 50 MG tablet   No current facility-administered medications for this encounter.    Maia Plan [facility] Pre-Surgical Testing [phone] 11/14/18  12:36 PM

## 2018-11-15 ENCOUNTER — Ambulatory Visit (HOSPITAL_COMMUNITY)
Admission: RE | Admit: 2018-11-15 | Discharge: 2018-11-16 | Disposition: A | Discharge status: Home / Self Care | Payer: Medicare Other | Source: Ambulatory Visit | Attending: Interventional Radiology | Admitting: Interventional Radiology

## 2018-11-15 ENCOUNTER — Other Ambulatory Visit: Payer: Self-pay

## 2018-11-15 ENCOUNTER — Ambulatory Visit (HOSPITAL_COMMUNITY)
Admission: RE | Admit: 2018-11-15 | Discharge: 2018-11-15 | Disposition: A | Discharge status: Home / Self Care | Payer: Medicare Other | Source: Ambulatory Visit | Attending: Interventional Radiology | Admitting: Interventional Radiology

## 2018-11-15 ENCOUNTER — Ambulatory Visit (HOSPITAL_COMMUNITY): Payer: Medicare Other | Admitting: Anesthesiology

## 2018-11-15 ENCOUNTER — Ambulatory Visit (HOSPITAL_COMMUNITY): Payer: Medicare Other | Admitting: Physician Assistant

## 2018-11-15 ENCOUNTER — Ambulatory Visit (HOSPITAL_COMMUNITY): Payer: Medicare Other

## 2018-11-15 ENCOUNTER — Encounter (HOSPITAL_COMMUNITY): Payer: Self-pay

## 2018-11-15 ENCOUNTER — Encounter (HOSPITAL_COMMUNITY)
Admission: RE | Disposition: A | Discharge status: Home / Self Care | Payer: Self-pay | Source: Ambulatory Visit | Attending: Interventional Radiology

## 2018-11-15 DIAGNOSIS — Z8673 Personal history of transient ischemic attack (TIA), and cerebral infarction without residual deficits: Secondary | ICD-10-CM | POA: Diagnosis not present

## 2018-11-15 DIAGNOSIS — Z885 Allergy status to narcotic agent status: Secondary | ICD-10-CM | POA: Insufficient documentation

## 2018-11-15 DIAGNOSIS — Z882 Allergy status to sulfonamides status: Secondary | ICD-10-CM | POA: Insufficient documentation

## 2018-11-15 DIAGNOSIS — Z01818 Encounter for other preprocedural examination: Secondary | ICD-10-CM

## 2018-11-15 DIAGNOSIS — C641 Malignant neoplasm of right kidney, except renal pelvis: Secondary | ICD-10-CM

## 2018-11-15 DIAGNOSIS — Z7902 Long term (current) use of antithrombotics/antiplatelets: Secondary | ICD-10-CM | POA: Diagnosis not present

## 2018-11-15 DIAGNOSIS — E119 Type 2 diabetes mellitus without complications: Secondary | ICD-10-CM | POA: Diagnosis not present

## 2018-11-15 DIAGNOSIS — N2889 Other specified disorders of kidney and ureter: Secondary | ICD-10-CM

## 2018-11-15 DIAGNOSIS — M19079 Primary osteoarthritis, unspecified ankle and foot: Secondary | ICD-10-CM | POA: Diagnosis not present

## 2018-11-15 DIAGNOSIS — Z888 Allergy status to other drugs, medicaments and biological substances status: Secondary | ICD-10-CM | POA: Diagnosis not present

## 2018-11-15 DIAGNOSIS — Z79899 Other long term (current) drug therapy: Secondary | ICD-10-CM | POA: Insufficient documentation

## 2018-11-15 DIAGNOSIS — Z880 Allergy status to penicillin: Secondary | ICD-10-CM | POA: Insufficient documentation

## 2018-11-15 DIAGNOSIS — Z90710 Acquired absence of both cervix and uterus: Secondary | ICD-10-CM | POA: Insufficient documentation

## 2018-11-15 DIAGNOSIS — E079 Disorder of thyroid, unspecified: Secondary | ICD-10-CM | POA: Insufficient documentation

## 2018-11-15 DIAGNOSIS — E7800 Pure hypercholesterolemia, unspecified: Secondary | ICD-10-CM | POA: Diagnosis not present

## 2018-11-15 DIAGNOSIS — Z79890 Hormone replacement therapy (postmenopausal): Secondary | ICD-10-CM | POA: Diagnosis not present

## 2018-11-15 DIAGNOSIS — I10 Essential (primary) hypertension: Secondary | ICD-10-CM | POA: Diagnosis not present

## 2018-11-15 HISTORY — PX: RADIOFREQUENCY ABLATION: SHX2290

## 2018-11-15 LAB — BASIC METABOLIC PANEL
Anion gap: 16 — ABNORMAL HIGH (ref 5–15)
BUN: 21 mg/dL — ABNORMAL HIGH (ref 6–20)
CO2: 20 mmol/L — ABNORMAL LOW (ref 22–32)
Calcium: 9 mg/dL (ref 8.9–10.3)
Chloride: 102 mmol/L (ref 98–111)
Creatinine, Ser: 1.13 mg/dL — ABNORMAL HIGH (ref 0.44–1.00)
GFR calc Af Amer: >60 mL/min (ref >60)
GFR calc non Af Amer: 54 mL/min — ABNORMAL LOW (ref >60)
Glucose, Bld: 287 mg/dL — ABNORMAL HIGH (ref 70–99)
Potassium: 3.9 mmol/L (ref 3.5–5.1)
Sodium: 138 mmol/L (ref 135–145)

## 2018-11-15 LAB — GLUCOSE, CAPILLARY
Glucose-Capillary: 259 mg/dL — ABNORMAL HIGH (ref 70–99)
Glucose-Capillary: 335 mg/dL — ABNORMAL HIGH (ref 70–99)
Glucose-Capillary: 325 mg/dL — ABNORMAL HIGH (ref 70–99)
Glucose-Capillary: 269 mg/dL — ABNORMAL HIGH (ref 70–99)
Glucose-Capillary: 258 mg/dL — ABNORMAL HIGH (ref 70–99)

## 2018-11-15 LAB — CBC WITH DIFFERENTIAL/PLATELET
Abs Immature Granulocytes: 0.02 10*3/uL (ref 0.00–0.07)
Basophils Absolute: 0 10*3/uL (ref 0.0–0.1)
Basophils Relative: 1 %
Eosinophils Absolute: 0.5 10*3/uL (ref 0.0–0.5)
Eosinophils Relative: 7 %
HCT: 39.6 % (ref 36.0–46.0)
Hemoglobin: 12.7 g/dL (ref 12.0–15.0)
Immature Granulocytes: 0 %
Lymphocytes Relative: 27 %
Lymphs Abs: 1.7 10*3/uL (ref 0.7–4.0)
MCH: 30 pg (ref 26.0–34.0)
MCHC: 32.1 g/dL (ref 30.0–36.0)
MCV: 93.6 fL (ref 80.0–100.0)
Monocytes Absolute: 0.5 10*3/uL (ref 0.1–1.0)
Monocytes Relative: 8 %
Neutro Abs: 3.5 10*3/uL (ref 1.7–7.7)
Neutrophils Relative %: 57 %
Platelets: 230 10*3/uL (ref 150–400)
RBC: 4.23 MIL/uL (ref 3.87–5.11)
RDW: 13.5 % (ref 11.5–15.5)
WBC: 6.2 10*3/uL (ref 4.0–10.5)
nRBC: 0 % (ref 0.0–0.2)

## 2018-11-15 LAB — TYPE AND SCREEN
ABO/RH(D): A POS
Antibody Screen: NEGATIVE

## 2018-11-15 LAB — PROTIME-INR
INR: 0.9 (ref 0.8–1.2)
Prothrombin Time: 12.1 seconds (ref 11.4–15.2)

## 2018-11-15 SURGERY — RADIO FREQUENCY ABLATION
Anesthesia: General | Laterality: Right

## 2018-11-15 MED ORDER — SITAGLIPTIN PHOS-METFORMIN HCL 50-1000 MG PO TABS: 1.0000 | ORAL_TABLET | Freq: Two times a day (BID) | ORAL | Status: DC

## 2018-11-15 MED ORDER — ALBUTEROL SULFATE HFA 108 (90 BASE) MCG/ACT IN AERS: 1.0000 | INHALATION_SPRAY | Freq: Four times a day (QID) | RESPIRATORY_TRACT | Status: DC | PRN

## 2018-11-15 MED ORDER — FENTANYL CITRATE (PF) 100 MCG/2ML IJ SOLN: 25.0000 ug | INTRAMUSCULAR | Status: DC | PRN

## 2018-11-15 MED ORDER — INSULIN ASPART 100 UNIT/ML ~~LOC~~ SOLN
SUBCUTANEOUS | Status: AC
  Administered 2018-11-15: 12:00:00 5 [IU] via SUBCUTANEOUS
  Filled 2018-11-15: qty 1

## 2018-11-15 MED ORDER — SODIUM CHLORIDE 0.9 % IV SOLN
INTRAVENOUS | Status: AC
  Filled 2018-11-15: qty 250

## 2018-11-15 MED ORDER — DIPHENHYDRAMINE HCL 12.5 MG/5ML PO ELIX: 12.5000 mg | ORAL_SOLUTION | Freq: Four times a day (QID) | ORAL | Status: DC | PRN

## 2018-11-15 MED ORDER — SODIUM CHLORIDE 0.9% FLUSH: 9.0000 mL | INTRAVENOUS | Status: DC | PRN

## 2018-11-15 MED ORDER — DEXAMETHASONE SODIUM PHOSPHATE 10 MG/ML IJ SOLN
INTRAMUSCULAR | Status: DC | PRN
  Administered 2018-11-15: 8 mg via INTRAVENOUS

## 2018-11-15 MED ORDER — EPHEDRINE SULFATE-NACL 50-0.9 MG/10ML-% IV SOSY
PREFILLED_SYRINGE | INTRAVENOUS | Status: DC | PRN
  Administered 2018-11-15: 5 mg via INTRAVENOUS

## 2018-11-15 MED ORDER — DOCUSATE SODIUM 100 MG PO CAPS
100.0000 mg | ORAL_CAPSULE | Freq: Two times a day (BID) | ORAL | Status: DC
  Administered 2018-11-15 – 2018-11-16 (×2): 100 mg via ORAL
  Filled 2018-11-15 (×2): qty 1

## 2018-11-15 MED ORDER — LINAGLIPTIN 5 MG PO TABS
5.0000 mg | ORAL_TABLET | Freq: Every day | ORAL | Status: DC
  Filled 2018-11-15: qty 1

## 2018-11-15 MED ORDER — SODIUM CHLORIDE 0.9 % IV SOLN
INTRAVENOUS | Status: AC
  Filled 2018-11-15: qty 1000

## 2018-11-15 MED ORDER — ROCURONIUM BROMIDE 10 MG/ML (PF) SYRINGE
PREFILLED_SYRINGE | INTRAVENOUS | Status: DC | PRN
  Administered 2018-11-15: 10 mg via INTRAVENOUS
  Administered 2018-11-15: 20 mg via INTRAVENOUS
  Administered 2018-11-15: 50 mg via INTRAVENOUS

## 2018-11-15 MED ORDER — SUCCINYLCHOLINE CHLORIDE 200 MG/10ML IV SOSY
PREFILLED_SYRINGE | INTRAVENOUS | Status: DC | PRN
  Administered 2018-11-15: 200 mg via INTRAVENOUS

## 2018-11-15 MED ORDER — PROMETHAZINE HCL 25 MG/ML IJ SOLN
6.2500 mg | INTRAMUSCULAR | Status: DC | PRN
  Administered 2018-11-15: 6.25 mg via INTRAVENOUS

## 2018-11-15 MED ORDER — INSULIN ASPART 100 UNIT/ML ~~LOC~~ SOLN
5.0000 [IU] | Freq: Once | SUBCUTANEOUS | Status: AC
  Administered 2018-11-15: 5 [IU] via SUBCUTANEOUS

## 2018-11-15 MED ORDER — DIPHENHYDRAMINE HCL 50 MG/ML IJ SOLN: 12.5000 mg | Freq: Four times a day (QID) | INTRAMUSCULAR | Status: DC | PRN

## 2018-11-15 MED ORDER — SODIUM CHLORIDE (PF) 0.9 % IJ SOLN
INTRAMUSCULAR | Status: AC
  Filled 2018-11-15: qty 100

## 2018-11-15 MED ORDER — ONDANSETRON HCL 4 MG/2ML IJ SOLN
INTRAMUSCULAR | Status: DC | PRN
  Administered 2018-11-15: 4 mg via INTRAVENOUS

## 2018-11-15 MED ORDER — INSULIN ASPART 100 UNIT/ML ~~LOC~~ SOLN
0.0000 [IU] | Freq: Every day | SUBCUTANEOUS | Status: DC
  Administered 2018-11-15: 3 [IU] via SUBCUTANEOUS

## 2018-11-15 MED ORDER — LEVOTHYROXINE SODIUM 100 MCG PO TABS
300.0000 ug | ORAL_TABLET | Freq: Every day | ORAL | Status: DC
  Administered 2018-11-16: 300 ug via ORAL
  Filled 2018-11-15: qty 3

## 2018-11-15 MED ORDER — HYDROMORPHONE 1 MG/ML IV SOLN
INTRAVENOUS | Status: DC
  Administered 2018-11-15: 30 mg via INTRAVENOUS
  Administered 2018-11-15: 0.5 mg via INTRAVENOUS
  Administered 2018-11-15: 0.3 mg via INTRAVENOUS
  Administered 2018-11-15: 0.6 mg via INTRAVENOUS
  Administered 2018-11-16: 0 mg via INTRAVENOUS
  Filled 2018-11-15: qty 30

## 2018-11-15 MED ORDER — FUROSEMIDE 40 MG PO TABS
80.0000 mg | ORAL_TABLET | Freq: Two times a day (BID) | ORAL | Status: DC
  Administered 2018-11-15 – 2018-11-16 (×2): 80 mg via ORAL
  Filled 2018-11-15 (×2): qty 2

## 2018-11-15 MED ORDER — INSULIN ASPART 100 UNIT/ML ~~LOC~~ SOLN
SUBCUTANEOUS | Status: AC
  Administered 2018-11-15: 5 [IU] via SUBCUTANEOUS
  Filled 2018-11-15: qty 1

## 2018-11-15 MED ORDER — FENTANYL CITRATE (PF) 250 MCG/5ML IJ SOLN
INTRAMUSCULAR | Status: AC
  Filled 2018-11-15: qty 5

## 2018-11-15 MED ORDER — METFORMIN HCL 500 MG PO TABS
1000.0000 mg | ORAL_TABLET | Freq: Two times a day (BID) | ORAL | Status: DC
  Administered 2018-11-16: 1000 mg via ORAL
  Filled 2018-11-15 (×2): qty 2

## 2018-11-15 MED ORDER — VANCOMYCIN HCL 10 G IV SOLR
1500.0000 mg | INTRAVENOUS | Status: AC
  Administered 2018-11-15: 08:00:00 1500 mg via INTRAVENOUS
  Filled 2018-11-15: qty 1500

## 2018-11-15 MED ORDER — ONDANSETRON HCL 4 MG/2ML IJ SOLN
4.0000 mg | Freq: Once | INTRAMUSCULAR | Status: AC | PRN
  Administered 2018-11-15: 13:00:00 4 mg via INTRAVENOUS

## 2018-11-15 MED ORDER — PROPOFOL 10 MG/ML IV BOLUS
INTRAVENOUS | Status: DC | PRN
  Administered 2018-11-15: 150 mg via INTRAVENOUS

## 2018-11-15 MED ORDER — ALBUTEROL SULFATE (2.5 MG/3ML) 0.083% IN NEBU: 2.5000 mg | INHALATION_SOLUTION | Freq: Four times a day (QID) | RESPIRATORY_TRACT | Status: DC | PRN

## 2018-11-15 MED ORDER — PROMETHAZINE HCL 25 MG/ML IJ SOLN
INTRAMUSCULAR | Status: AC
  Filled 2018-11-15: qty 1

## 2018-11-15 MED ORDER — METOPROLOL TARTRATE 50 MG PO TABS
100.0000 mg | ORAL_TABLET | Freq: Two times a day (BID) | ORAL | Status: DC
  Administered 2018-11-15 – 2018-11-16 (×2): 100 mg via ORAL
  Filled 2018-11-15 (×2): qty 2

## 2018-11-15 MED ORDER — SUGAMMADEX SODIUM 500 MG/5ML IV SOLN
INTRAVENOUS | Status: DC | PRN
  Administered 2018-11-15: 250 mg via INTRAVENOUS

## 2018-11-15 MED ORDER — FENTANYL CITRATE (PF) 100 MCG/2ML IJ SOLN
INTRAMUSCULAR | Status: DC | PRN
  Administered 2018-11-15: 100 ug via INTRAVENOUS
  Administered 2018-11-15: 50 ug via INTRAVENOUS

## 2018-11-15 MED ORDER — IOHEXOL 350 MG/ML SOLN
100.0000 mL | Freq: Once | INTRAVENOUS | Status: AC | PRN
  Administered 2018-11-15: 100 mL via INTRAVENOUS

## 2018-11-15 MED ORDER — ONDANSETRON HCL 4 MG/2ML IJ SOLN
INTRAMUSCULAR | Status: AC
  Administered 2018-11-15: 4 mg via INTRAVENOUS
  Filled 2018-11-15: qty 2

## 2018-11-15 MED ORDER — LISINOPRIL 10 MG PO TABS
10.0000 mg | ORAL_TABLET | Freq: Every day | ORAL | Status: DC
  Administered 2018-11-15 – 2018-11-16 (×2): 10 mg via ORAL
  Filled 2018-11-15 (×2): qty 1

## 2018-11-15 MED ORDER — SODIUM CHLORIDE 0.9 % IV SOLN
INTRAVENOUS | Status: DC | PRN
  Administered 2018-11-15: 09:00:00 10 ug/min via INTRAVENOUS

## 2018-11-15 MED ORDER — PROMETHAZINE HCL 25 MG/ML IJ SOLN
12.5000 mg | Freq: Once | INTRAMUSCULAR | Status: AC
  Administered 2018-11-15: 12.5 mg via INTRAVENOUS
  Filled 2018-11-15: qty 1

## 2018-11-15 MED ORDER — PRAVASTATIN SODIUM 20 MG PO TABS
20.0000 mg | ORAL_TABLET | Freq: Every day | ORAL | Status: DC
  Administered 2018-11-16: 20 mg via ORAL
  Filled 2018-11-15: qty 1

## 2018-11-15 MED ORDER — LIDOCAINE 2% (20 MG/ML) 5 ML SYRINGE
INTRAMUSCULAR | Status: DC | PRN
  Administered 2018-11-15: 80 mg via INTRAVENOUS

## 2018-11-15 MED ORDER — PANTOPRAZOLE SODIUM 40 MG PO TBEC
40.0000 mg | DELAYED_RELEASE_TABLET | Freq: Every day | ORAL | Status: DC
  Administered 2018-11-16: 40 mg via ORAL
  Filled 2018-11-15: qty 1

## 2018-11-15 MED ORDER — MIDAZOLAM HCL 2 MG/2ML IJ SOLN
INTRAMUSCULAR | Status: AC
  Filled 2018-11-15: qty 2

## 2018-11-15 MED ORDER — LEVOTHYROXINE SODIUM 150 MCG PO TABS: 300.0000 ug | ORAL_TABLET | Freq: Every day | ORAL | Status: DC

## 2018-11-15 MED ORDER — ONDANSETRON HCL 4 MG/2ML IJ SOLN
4.0000 mg | Freq: Four times a day (QID) | INTRAMUSCULAR | Status: DC | PRN
  Administered 2018-11-15: 4 mg via INTRAVENOUS
  Filled 2018-11-15: qty 2

## 2018-11-15 MED ORDER — OMEPRAZOLE MAGNESIUM 20 MG PO TBEC: 20.0000 mg | DELAYED_RELEASE_TABLET | Freq: Every day | ORAL | Status: DC

## 2018-11-15 MED ORDER — INSULIN ASPART 100 UNIT/ML ~~LOC~~ SOLN
0.0000 [IU] | Freq: Three times a day (TID) | SUBCUTANEOUS | Status: DC
  Administered 2018-11-15: 11 [IU] via SUBCUTANEOUS
  Administered 2018-11-16: 4 [IU] via SUBCUTANEOUS

## 2018-11-15 MED ORDER — LACTATED RINGERS IV SOLN
INTRAVENOUS | Status: DC
  Administered 2018-11-15: 08:00:00 via INTRAVENOUS

## 2018-11-15 MED ORDER — NALOXONE HCL 0.4 MG/ML IJ SOLN: 0.4000 mg | INTRAMUSCULAR | Status: DC | PRN

## 2018-11-15 MED ORDER — METOPROLOL TARTRATE 50 MG PO TABS: 100.0000 mg | ORAL_TABLET | Freq: Two times a day (BID) | ORAL | Status: DC

## 2018-11-15 MED ORDER — MIDAZOLAM HCL 5 MG/5ML IJ SOLN
INTRAMUSCULAR | Status: DC | PRN
  Administered 2018-11-15: 2 mg via INTRAVENOUS

## 2018-11-15 NOTE — Progress Notes (Addendum)
Patient ID: [name], female   DOB: 29-Sep-1960, 58 y.o.   MRN: [id number] Pt sl drowsy ; c/o rt lat abd/flank discomfort, occ nausea, no vomiting ; on dilaudid PCA; no hematuria BP 153/82  HR 75  AF O2 SATS 97% 2 liters N/C Puncture site rt flank clean and dry, area soft to touch, mildly tender  A/P: s/p CT guided cryoablation residual disease rt upper pole renal mass/RCC earlier today; for overnight obs; hydrate; d/c foley later this evening; clinic f/u in 4 weeks either in person or tele med; f/u imaging in 3 mos   [facility]

## 2018-11-15 NOTE — Plan of Care (Signed)
Continue current POC 

## 2018-11-15 NOTE — Progress Notes (Signed)
Pt started vomiting, given zofran but pt still complaining of nausea. MD made aware and new orders placed.

## 2018-11-15 NOTE — Procedures (Signed)
Pre procedural Dx: Right sided kidney cancer  Post procedural Dx: Same  Technically successful CT guided repeat cryoablation of residual RCC within the superior pole of the right kidney.   EBL: None.  Complications: None immediate.   Ronny Bacon, MD Pager #: [phone]

## 2018-11-15 NOTE — Anesthesia Procedure Notes (Signed)
Procedure Name: Intubation Date/Time: 11/15/2018 8:44 AM Performed by: Victoriano Lain, CRNA Pre-anesthesia Checklist: Patient identified, Emergency Drugs available, Suction available, Patient being monitored and Timeout performed Patient Re-evaluated:Patient Re-evaluated prior to induction Oxygen Delivery Method: Circle system utilized Preoxygenation: Pre-oxygenation with 100% oxygen Induction Type: IV induction Ventilation: Mask ventilation without difficulty Laryngoscope Size: Mac and 4 Grade View: Grade I Tube type: Oral Tube size: 7.5 mm Number of attempts: 1 Airway Equipment and Method: Stylet Placement Confirmation: ETT inserted through vocal cords under direct vision,  breath sounds checked- equal and bilateral and positive ETCO2 Secured at: 20 cm Tube secured with: Tape Dental Injury: Teeth and Oropharynx as per pre-operative assessment

## 2018-11-15 NOTE — Anesthesia Postprocedure Evaluation (Signed)
Anesthesia Post Note  Patient: [name]  Procedure(s) Performed: RIGHT RENAL CRYOABLATION (Right )     Patient location during evaluation: PACU Anesthesia Type: General Level of consciousness: awake and alert Pain management: pain level controlled Vital Signs Assessment: post-procedure vital signs reviewed and stable Respiratory status: spontaneous breathing, nonlabored ventilation and respiratory function stable Cardiovascular status: blood pressure returned to baseline and stable Postop Assessment: no apparent nausea or vomiting Anesthetic complications: no    Last Vitals:  Vitals:   11/15/18 1230 11/15/18 1245  BP: (!) 147/92   Pulse: 76   Resp: (!) 21   Temp:  36.8 C  SpO2: 99%     Last Pain:  Vitals:   11/15/18 1215  PainSc: 0-No pain                 Audry Pili

## 2018-11-15 NOTE — H&P (Addendum)
Referring Physician(s): Bell,E  Supervising Physician: Sandi Mariscal  Patient Status:  [facility] TBA  Chief Complaint:  Right renal mass  Subjective: Patient familiar to IR service from prior CT-guided cryoablation of right upper and mid pole renal lesions on 08/05/2017.  She has a past medical history significant for diabetes, hypertension, prior TIA, CAD, hyperlipidemia and thyroid cancer as well.  Recent follow-up imaging has revealed nodular enhancing tissue adjacent to the ablation site involving the superior pole of the right kidney.  Following discussions with Dr. Pascal Lux she was deemed a candidate for repeat CT-guided cryoablation of the residual/locally recurrent disease involving the superior pole of the right kidney and presents today for the procedure.  She denies fever, headache, chest pain, dyspnea, cough, vomiting.  She does have some intermittent right lateral abdominal/flank discomfort, occasional nausea and some rectal bleeding which she attributes to hemorrhoids.  She is COVID-19 negative.  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 "   Asthma    due to allergies    Cancer (Needles)    THRYOID 2016 [place], KIDNEY CANCER MAY 2018  RIGHT RENAL CRYOABLATION   Complication of anesthesia    Diabetes mellitus    TYPE 2   Dyspnea    increased exertion    Dysrhythmia    " irregular heart rate per patient"   Family history of adverse reaction to anesthesia    difficult for father to wake after surgery, AFTHER LOW BP WITH ANESTHESIA   Heart murmur    Hypercholesteremia    Hypertension    PONV (postoperative nausea and vomiting)    Thyroid disease    TIA (transient ischemic attack)    LAST TIA  APRIL 2020 ON PLAVIX   Past Surgical History:  Procedure Laterality Date   ABDOMINAL HYSTERECTOMY     COMPLETE   CHOLECYSTECTOMY     COLON SURGERY     IR RADIOLOGIST EVAL & MGMT  01/12/2017   IR RADIOLOGIST  EVAL & MGMT  06/16/2017   IR RADIOLOGIST EVAL & MGMT  08/31/2017   IR RADIOLOGIST EVAL & MGMT  12/13/2017   IR RADIOLOGIST EVAL & MGMT  03/16/2018   IR RADIOLOGIST EVAL & MGMT  11/01/2018   KNEE ARTHROSCOPY     LEFT HEART CATHETERIZATION WITH CORONARY ANGIOGRAM N/A 04/24/2014   Procedure: LEFT HEART CATHETERIZATION WITH CORONARY ANGIOGRAM;  Surgeon: Burnell Blanks, MD;  Location: [facility] CATH LAB;  Service: Cardiovascular;  Laterality: N/A;   RADIOFREQUENCY ABLATION Right 08/05/2017   Procedure: CT RENAL CRYO AND BIOPSY;  Surgeon: Sandi Mariscal, MD;  Location: [facility] ORS;  Service: Anesthesiology;  Laterality: Right;   THYROIDECTOMY N/A 09/20/2014   Procedure: TOTAL THYROIDECTOMY;  Surgeon: Armandina Gemma, MD;  Location: [facility] ORS;  Service: General;  Laterality: N/A;   TONSILLECTOMY        Allergies: Lorabid [loracarbef], Augmentin [amoxicillin-pot clavulanate], Codeine, Gadolinium derivatives, Sulfa drugs cross reactors, Benicar [olmesartan medoxomil], and Other  Medications: Prior to Admission medications   Medication Sig Start Date End Date Taking? Authorizing Provider  acetaminophen (TYLENOL) 500 MG tablet Take 2 tablets (1,000 mg total) by mouth every 6 (six) hours as needed (body aches). Patient taking differently: Take 1,000 mg by mouth every 4 (four) hours as needed for moderate pain or headache (body aches).  06/29/17  Yes Eulogio Bear U, DO  b complex vitamins tablet Take 1 tablet  by mouth daily.   Yes [provider]  calcium carbonate (OSCAL) 1500 (600 Ca) MG TABS tablet Take by mouth 2 (two) times daily with a meal. MAGNESIUM AND ZINC 2 IN AM 2 AT 1200 PM 2 AT DINNERTIME   Yes [provider]  clopidogrel (PLAVIX) 75 MG tablet Take 1 tablet (75 mg total) by mouth daily. 09/15/15  Yes Florencia Reasons, MD  diphenhydrAMINE (BENADRYL) 25 MG tablet Take 50 mg by mouth at bedtime.    Yes [provider]  furosemide (LASIX) 40 MG tablet Take 2 tablets (80 mg total) by  mouth 2 (two) times daily. 05/17/16  Yes Vann, Jessica U, DO  ibuprofen (ADVIL) 800 MG tablet Take 800 mg by mouth every 8 (eight) hours as needed for headache or moderate pain.   Yes [provider]  JANUMET 50-1000 MG tablet Take 1 tablet by mouth 2 (two) times daily. 09/16/17  Yes [provider]  levothyroxine (SYNTHROID, LEVOTHROID) 300 MCG tablet Take 300 mcg by mouth daily before breakfast.   Yes [provider]  lisinopril (PRINIVIL,ZESTRIL) 10 MG tablet Take 10 mg by mouth daily.  09/09/14  Yes [provider]  metoprolol tartrate (LOPRESSOR) 100 MG tablet Take 100 mg by mouth 2 (two) times daily. 06/23/16  Yes [provider]  Multiple Vitamin (MULTIVITAMIN PO) Take 1 tablet by mouth daily.   Yes [provider]  Nutritional Supplements (ESTROVEN MAXIMUM STRENGTH PO) Take 1 tablet by mouth daily.   Yes [provider]  omeprazole (PRILOSEC OTC) 20 MG tablet Take 20 mg by mouth daily.   Yes [provider]  ondansetron (ZOFRAN) 8 MG tablet Take 8 mg by mouth every 8 (eight) hours as needed for nausea or vomiting.   Yes [provider]  potassium chloride SA (K-DUR,KLOR-CON) 20 MEQ tablet Take 40 mEq by mouth 2 (two) times daily.    Yes [provider]  pravastatin (PRAVACHOL) 20 MG tablet Take 20 mg by mouth daily.  06/25/13  Yes [provider]  Probiotic Product (PROBIOTIC PO) Take 1 tablet by mouth daily.   Yes [provider]  zinc gluconate 50 MG tablet Take 50 mg by mouth daily.   Yes [provider]  albuterol (PROVENTIL HFA;VENTOLIN HFA) 108 (90 Base) MCG/ACT inhaler Inhale 1-2 puffs into the lungs every 6 (six) hours as needed for wheezing or shortness of breath. 12/28/17   Jean Rosenthal, MD     Vital Signs: Blood pressure 146/77, heart rate 78, respirations 18, temp 98, O2 sats 97% room air Ht 5\' 4"  (1.626 m)    Wt 250 lb (113.4 kg)    BMI 42.91 kg/m   Physical Exam  awake, alert.  Chest clear to auscultation bilaterally.  Heart with regular rate and rhythm.  Abdomen obese, soft, positive bowel sounds, some mild lateral right/flank tenderness to palpation; extremities with full range of motion.  Imaging: No results found.  Labs:  CBC: Recent Labs    03/03/18 2145 04/29/18 2224 04/30/18 0538 11/10/18 0906  WBC 7.8 7.1 7.2 5.8  HGB 12.7 13.6 12.5 12.5  HCT 41.6 41.7 38.0 39.9  PLT 305 298 256 228    COAGS: Recent Labs    04/29/18 2224  INR 1.00  APTT 28    BMP: Recent Labs    12/28/17 0907 03/03/18 2145 04/29/18 2224 04/30/18 0538 11/10/18 0906  NA 137 140 137  --  138  K 3.8 4.3 3.6  --  4.3  CL 101 104 100  --  100  CO2 22 23 22   --  27  GLUCOSE 237* 177* 202*  --  262*  BUN 21* 16 21*  --  21*  CALCIUM 8.6* 8.5* 8.9  --  8.6*  CREATININE 1.35* 1.48* 1.37* 1.11* 1.29*  GFRNONAA 43* 39* 43* 55* 46*  GFRAA 50* 46* 50* >60 54*    LIVER FUNCTION TESTS: Recent Labs    04/29/18 2224  BILITOT 0.5  AST 17  ALT 19  ALKPHOS 68  PROT 7.3  ALBUMIN 3.9    Assessment and Plan: Pt with past medical history significant for diabetes, hypertension, prior TIA, CAD, hyperlipidemia, chronic but mildly elevated creatinine,  and thyroid cancer ; s/p CT-guided cryoablation of right upper and midpole renal lesions (suspicious for RCC) on 08/05/2017;  recent follow-up imaging has revealed nodular enhancing tissue adjacent to the ablation site involving the superior pole of the right kidney.  Following discussions with Dr. Pascal Lux she was deemed a candidate for repeat CT-guided cryoablation of the residual/locally recurrent disease involving the superior pole of the right kidney and presents today for the procedure.  Details/risks of procedure, including but not limited to, internal bleeding, infection, injury to adjacent structures discussed with patient with her understanding and consent.  Post procedure she will be admitted to the hospital for  overnight observation.This procedure involves the use of X-rays and because of the nature of the planned procedure, it is possible that we will have prolonged use of X-ray fluoroscopy.  Potential radiation risks to you include (but are not limited to) the following: - A slightly elevated risk for cancer  several years later in life. This risk is typically less than 0.5% percent. This risk is low in comparison to the normal incidence of human cancer, which is 33% for women and 50% for men according to the [employer]. - Radiation induced injury can include skin redness, resembling a rash, tissue breakdown / ulcers and hair loss (which can be temporary or permanent).   The likelihood of either of these occurring depends on the difficulty of the procedure and whether you are sensitive to radiation due to previous procedures, disease, or genetic conditions.   IF your procedure requires a prolonged use of radiation, you will be notified and given written instructions for further action.  It is your responsibility to monitor the irradiated area for the 2 weeks following the procedure and to notify your physician if you are concerned that you have suffered a radiation induced injury.    LABS PENDING    Electronically Signed: D. Rowe Robert, PA-C 11/15/2018, 7:52 AM   I spent a total of 30 minutes at the the patient's bedside AND on the patient's hospital floor or unit, greater than 50% of which was counseling/coordinating care for CT-guided cryoablation of right renal lesion

## 2018-11-15 NOTE — Transfer of Care (Signed)
Immediate Anesthesia Transfer of Care Note  Patient: [name]  Procedure(s) Performed: RIGHT RENAL CRYOABLATION (Right )  Patient Location: PACU  Anesthesia Type:General  Level of Consciousness: awake, alert , oriented and patient cooperative  Airway & Oxygen Therapy: Patient Spontanous Breathing and Patient connected to face mask oxygen  Post-op Assessment: Report given to RN, Post -op Vital signs reviewed and stable and Patient moving all extremities  Post vital signs: Reviewed and stable  Last Vitals:  Vitals Value Taken Time  BP 149/88 11/15/18 1149  Temp    Pulse 63 11/15/18 1152  Resp 21 11/15/18 1152  SpO2 100 % 11/15/18 1152  Vitals shown include unvalidated device data.  Last Pain: There were no vitals filed for this visit.       Complications: No apparent anesthesia complications

## 2018-11-16 ENCOUNTER — Encounter (HOSPITAL_COMMUNITY): Payer: Self-pay | Admitting: Interventional Radiology

## 2018-11-16 DIAGNOSIS — C641 Malignant neoplasm of right kidney, except renal pelvis: Secondary | ICD-10-CM | POA: Diagnosis not present

## 2018-11-16 LAB — CBC WITH DIFFERENTIAL/PLATELET
Abs Immature Granulocytes: 0.04 10*3/uL (ref 0.00–0.07)
Basophils Absolute: 0 10*3/uL (ref 0.0–0.1)
Basophils Relative: 0 %
Eosinophils Absolute: 0 10*3/uL (ref 0.0–0.5)
Eosinophils Relative: 0 %
HCT: 36.1 % (ref 36.0–46.0)
Hemoglobin: 11.3 g/dL — ABNORMAL LOW (ref 12.0–15.0)
Immature Granulocytes: 1 %
Lymphocytes Relative: 13 %
Lymphs Abs: 1.1 10*3/uL (ref 0.7–4.0)
MCH: 29.8 pg (ref 26.0–34.0)
MCHC: 31.3 g/dL (ref 30.0–36.0)
MCV: 95.3 fL (ref 80.0–100.0)
Monocytes Absolute: 0.7 10*3/uL (ref 0.1–1.0)
Monocytes Relative: 8 %
Neutro Abs: 6.9 10*3/uL (ref 1.7–7.7)
Neutrophils Relative %: 78 %
Platelets: 244 10*3/uL (ref 150–400)
RBC: 3.79 MIL/uL — ABNORMAL LOW (ref 3.87–5.11)
RDW: 13.5 % (ref 11.5–15.5)
WBC: 8.8 10*3/uL (ref 4.0–10.5)
nRBC: 0 % (ref 0.0–0.2)

## 2018-11-16 LAB — BASIC METABOLIC PANEL
Anion gap: 13 (ref 5–15)
BUN: 22 mg/dL — ABNORMAL HIGH (ref 6–20)
CO2: 21 mmol/L — ABNORMAL LOW (ref 22–32)
Calcium: 8.2 mg/dL — ABNORMAL LOW (ref 8.9–10.3)
Chloride: 103 mmol/L (ref 98–111)
Creatinine, Ser: 1.22 mg/dL — ABNORMAL HIGH (ref 0.44–1.00)
GFR calc Af Amer: 57 mL/min — ABNORMAL LOW (ref >60)
GFR calc non Af Amer: 49 mL/min — ABNORMAL LOW (ref >60)
Glucose, Bld: 216 mg/dL — ABNORMAL HIGH (ref 70–99)
Potassium: 3.7 mmol/L (ref 3.5–5.1)
Sodium: 137 mmol/L (ref 135–145)

## 2018-11-16 LAB — GLUCOSE, CAPILLARY: Glucose-Capillary: 188 mg/dL — ABNORMAL HIGH (ref 70–99)

## 2018-11-16 NOTE — Plan of Care (Signed)
Assessment unchanged. Pt verbalized understanding of dc instructions through teach back. Discharged via wc to front entrance by NT to meet ride home.

## 2018-11-16 NOTE — Discharge Summary (Signed)
Patient ID: [name] MRN: [id number] DOB/AGE: 10/11/1960 58 y.o.  Admit date: 11/15/2018 Discharge date: 11/16/2018  Supervising Physician: Corrie Mckusick  Patient Status: [facility] - In-pt  Admission Diagnoses: Right renal cell carcinoma  Discharge Diagnoses:  Right renal cell carcinoma  Discharged Condition: stable  Hospital Course:  Patient presented to [facility] 11/15/2018 for an image-guided perctuaneous right renal residual lesion cryoablation by Dr. Pascal Lux. Procedure occurred without major complications and patient was transferred up to floor in stable condition (VSS, right flank incision stable) for overnight observation. No major events occurred overnight.  Patient awake and alert sitting in bed. Accompanied by daughter and granddaughter via video chat. Denies right flank pain or hematuria. Plan to discharge home today and follow-up with Dr. Pascal Lux in clinic 4 weeks after discharge.   Consults: None  Significant Diagnostic Studies: Dg Chest 1 View  Result Date: 11/15/2018 CLINICAL DATA:  Preoperative for renal cryoablation.  Hypertension. EXAM: CHEST  1 VIEW COMPARISON:  December 28, 2017 FINDINGS: Lungs are clear. Heart size and pulmonary vascularity are normal. No adenopathy. No bone lesions. IMPRESSION: No edema or consolidation.  Stable cardiac silhouette. Electronically Signed   By: Lowella Grip III M.D.   On: 11/15/2018 08:11   Ct Guide Tissue Ablation  Result Date: 11/15/2018 CLINICAL DATA:  History of residual/recurrent right-sided renal cell carcinoma. Patient presents today for CT-guided right renal cryoablation. Please refer to formal consultation in the epic EMR dated 11/01/2018 for additional details. EXAM: CT GUIDED RIGHT RENAL CRYOABLATION COMPARISON:  CT abdomen and pelvis-10/19/2018; 03/15/2018; 09/21/2017; abdominal MRI-12/13/2017; CT-guided right renal cryoablation-08/05/2017 ANESTHESIA/SEDATION: General MEDICATIONS: Vancomycin 1.5 IV .The antibiotic was  administered in an appropriate time interval prior to needle puncture of the skin. CONTRAST:  172mL OMNIPAQUE IOHEXOL 350 MG/ML SOLN PROCEDURE: The procedure, risks, benefits, and alternatives were explained to the patient. Questions regarding the procedure were encouraged and answered. The patient understands and consents to the procedure. The patient was placed under general anesthesia. Initial un-enhanced CT was performed in a prone position to localize the right kidney and ill-defined hypoattenuating lesion within the superior, medial aspect of the right kidney. The skin overlying midline of the right side of the back was prepped with Betadine in a sterile fashion, and a sterile drape was applied covering the operative field. 22 gauge spinal needle was utilized for trajectory planning purposes. Next, intravenous contrast was administered and arterial and delayed portal venous phase images were obtained. Next, under CT guidance, two 14 gauge IcePearl 2.1 CX cryoablation probes were placed bracketing the superior and inferior aspects of the lesion utilizing a posterior approach. Appropriate position was confirmed and the cryoablation was performed. Initial 10 minute cycle of cryoablation was performed. This was followed by a 8 minute thaw cycle. A second 10 minute cycle of cryoablation was then performed. During the ablation, periodic CT imaging was performed to monitor ice ball formation and morphology. After a 4 minute thaw, the cryoablation probes were removed following tract ablation. Post-procedural CT was performed. COMPLICATIONS: None immediate. FINDINGS: Contrast-enhanced imaging demonstrates grossly unchanged size and appearance of the ill-defined approximately 1.6 x 1.5 cm hypoattenuating lesion within the superior, medial aspect of the right kidney (image 21, series 4). Following probe positioning and cryo ablation, the ice ball appears to encompass the entirety of the residual ill-defined  hypoattenuating renal lesion (best appreciated on series 15). Postprocedural imaging was negative for complication, specifically, no evidence of a sizable perinephric hematoma. IMPRESSION: Technically successful CT-guided cryoablation of residual lesion within the  superomedial aspect of the right kidney. PLAN: - The patient will be seen in 4 weeks either at the interventional radiology clinic or via tele medicine visit with postprocedural BMP. - Initial surveillance imaging will obtained in 3 months (mid November 2020). Again, surveillance imaging will obtained with CT given patient's history allergy to MRI contrast. Electronically Signed   By: Sandi Mariscal M.D.   On: 11/15/2018 13:46   Ir Radiologist Eval & Mgmt  Result Date: 11/01/2018 Please refer to notes tab for details about interventional procedure. (Op Note)   Treatments: Percutaneous cryoablation of right renal residual lesion  Discharge Exam: Blood pressure 134/77, pulse 73, temperature 97.7 F (36.5 C), temperature source Oral, resp. rate 18, height 5\' 4"  (1.626 m), weight 250 lb (113.4 kg), SpO2 98 %. Physical Exam Vitals signs and nursing note reviewed.  Constitutional:      General: She is not in acute distress.    Appearance: Normal appearance.  Cardiovascular:     Rate and Rhythm: Normal rate and regular rhythm.     Heart sounds: Normal heart sounds. No murmur.  Pulmonary:     Effort: Pulmonary effort is normal. No respiratory distress.     Breath sounds: Normal breath sounds.  Abdominal:     Comments: Right flank incision site soft without tenderness, erythema, drainage, or active bleeding.  Skin:    General: Skin is warm and dry.  Neurological:     Mental Status: She is alert and oriented to person, place, and time.  Psychiatric:        Mood and Affect: Mood normal.        Behavior: Behavior normal.        Thought Content: Thought content normal.        Judgment: Judgment normal.     Disposition: Discharge  disposition: 01-Home or Self Care       Discharge Instructions    Call MD for:  difficulty breathing, headache or visual disturbances   Complete by: As directed    Call MD for:  extreme fatigue   Complete by: As directed    Call MD for:  hives   Complete by: As directed    Call MD for:  persistant dizziness or light-headedness   Complete by: As directed    Call MD for:  persistant nausea and vomiting   Complete by: As directed    Call MD for:  redness, tenderness, or signs of infection (pain, swelling, redness, odor or green/yellow discharge around incision site)   Complete by: As directed    Call MD for:  severe uncontrolled pain   Complete by: As directed    Call MD for:  temperature >100.4   Complete by: As directed    Diet - low sodium heart healthy   Complete by: As directed    Increase activity slowly   Complete by: As directed    Remove dressing in 24 hours   Complete by: As directed      Allergies as of 11/16/2018      Reactions   Lorabid [loracarbef] Anaphylaxis   Augmentin [amoxicillin-pot Clavulanate] Hives, Nausea And Vomiting, Other (See Comments)   Chest pain   Codeine Nausea And Vomiting   Gadolinium Derivatives Itching, Swelling, Other (See Comments)   Patient will require 13 hr prep prior to administration of gadolinium     Sulfa Drugs Cross Reactors Other (See Comments)   Severe allergic reaction as a child - was not told symptoms   Benicar [olmesartan  Medoxomil] Swelling, Rash   Other Rash   Green peas      Medication List    TAKE these medications   acetaminophen 500 MG tablet Commonly known as: TYLENOL Take 2 tablets (1,000 mg total) by mouth every 6 (six) hours as needed (body aches). What changed:   when to take this  reasons to take this   albuterol 108 (90 Base) MCG/ACT inhaler Commonly known as: VENTOLIN HFA Inhale 1-2 puffs into the lungs every 6 (six) hours as needed for wheezing or shortness of breath.   b complex vitamins  tablet Take 1 tablet by mouth daily.   calcium carbonate 1500 (600 Ca) MG Tabs tablet Commonly known as: OSCAL Take by mouth 2 (two) times daily with a meal. MAGNESIUM AND ZINC 2 IN AM 2 AT 1200 PM 2 AT DINNERTIME   clopidogrel 75 MG tablet Commonly known as: PLAVIX Take 1 tablet (75 mg total) by mouth daily.   diphenhydrAMINE 25 MG tablet Commonly known as: BENADRYL Take 50 mg by mouth at bedtime.   ESTROVEN MAXIMUM STRENGTH PO Take 1 tablet by mouth daily.   furosemide 40 MG tablet Commonly known as: LASIX Take 2 tablets (80 mg total) by mouth 2 (two) times daily.   ibuprofen 800 MG tablet Commonly known as: ADVIL Take 800 mg by mouth every 8 (eight) hours as needed for headache or moderate pain.   Janumet 50-1000 MG tablet Generic drug: sitaGLIPtin-metformin Take 1 tablet by mouth 2 (two) times daily.   levothyroxine 300 MCG tablet Commonly known as: SYNTHROID Take 300 mcg by mouth daily before breakfast.   lisinopril 10 MG tablet Commonly known as: ZESTRIL Take 10 mg by mouth daily.   metoprolol tartrate 100 MG tablet Commonly known as: LOPRESSOR Take 100 mg by mouth 2 (two) times daily.   MULTIVITAMIN PO Take 1 tablet by mouth daily.   omeprazole 20 MG tablet Commonly known as: PRILOSEC OTC Take 20 mg by mouth daily.   ondansetron 8 MG tablet Commonly known as: ZOFRAN Take 8 mg by mouth every 8 (eight) hours as needed for nausea or vomiting.   potassium chloride SA 20 MEQ tablet Commonly known as: K-DUR Take 40 mEq by mouth 2 (two) times daily.   pravastatin 20 MG tablet Commonly known as: PRAVACHOL Take 20 mg by mouth daily.   PROBIOTIC PO Take 1 tablet by mouth daily.   zinc gluconate 50 MG tablet Take 50 mg by mouth daily.      Follow-up Information    Sandi Mariscal, MD Follow up in 4 week(s).   Specialties: Interventional Radiology, Radiology Why: Please follow-up with Dr. Pascal Lux in clinic 4 weeks after discharge. Our office will call you  to set up this appointment. Contact information: [address] [place] [address] [phone]            Electronically Signed: Earley Abide, PA-C 11/16/2018, 10:19 AM   I have spent Less Than 30 Minutes discharging [employer].

## 2018-11-16 NOTE — Discharge Instructions (Signed)
Cryoablation, Care After This sheet gives you information about how to care for yourself after your procedure. Your health care provider may also give you more specific instructions. If you have problems or questions, contact your health care provider. What can I expect after the procedure? After the procedure, it is common to have:  Soreness around the treatment area.  Mild pain and swelling in the treatment area. Follow these instructions at home: Treatment area care  Follow instructions from your health care provider about how to take care of your incision. Make sure you: ? Wash your hands with soap and water before you change your bandage (dressing). If soap and water are not available, use hand sanitizer. ? Change your dressing as told by your health care provider.  Check your treatment area every day for signs of infection. Check for: ? More redness, swelling, or pain. ? More fluid or blood. ? Warmth. ? Pus or a bad smell.  Keep the treated area clean, dry, and covered with a dressing until it has healed. Clean the area with soap and water or as told by your health care provider.  You may shower if your health care provider approves. If your bandage gets wet, change it right away. Activity  Follow instructions from your health care provider about any activity limitations.  Do not drive for 24 hours if you received a medicine to help you relax (sedative). General instructions  Take over-the-counter and prescription medicines only as told by your health care provider.  Keep all follow-up visits as told by your health care provider. This is important. Contact a health care provider if:  You do not have a bowel movement for 2 days.  You have nausea or vomiting.  You have more redness, swelling, or pain around your treatment area.  You have more fluid or blood coming from your treatment area.  Your treatment area feels warm to the touch.  You have pus or a bad smell  coming from your treatment area.  You have a fever. Get help right away if:  You have severe pain.  You have trouble swallowing or breathing.  You have severe weakness or dizziness.  You have chest pain or shortness of breath. This information is not intended to replace advice given to you by your health care provider. Make sure you discuss any questions you have with your health care provider. Document Released: 01/03/2013 Document Revised: 02/25/2017 Document Reviewed: 08/13/2015 Elsevier Patient Education  2020 [employer].

## 2018-11-20 ENCOUNTER — Other Ambulatory Visit: Payer: Self-pay

## 2018-11-20 DIAGNOSIS — C641 Malignant neoplasm of right kidney, except renal pelvis: Secondary | ICD-10-CM

## 2018-12-12 LAB — BASIC METABOLIC PANEL WITH GFR
BUN/Creatinine Ratio: 15 (calc) (ref 6–22)
BUN: 17 mg/dL (ref 7–25)
CO2: 25 mmol/L (ref 20–32)
Calcium: 8.3 mg/dL — ABNORMAL LOW (ref 8.6–10.4)
Chloride: 100 mmol/L (ref 98–110)
Creat: 1.15 mg/dL — ABNORMAL HIGH (ref 0.50–1.05)
GFR, Est African American: 62 mL/min/{1.73_m2} (ref >=60)
GFR, Est Non African American: 53 mL/min/{1.73_m2} — ABNORMAL LOW (ref >=60)
Glucose, Bld: 233 mg/dL — ABNORMAL HIGH (ref 65–99)
Potassium: 3.8 mmol/L (ref 3.5–5.3)
Sodium: 140 mmol/L (ref 135–146)

## 2018-12-13 ENCOUNTER — Ambulatory Visit
Admission: RE | Admit: 2018-12-13 | Discharge: 2018-12-13 | Disposition: A | Discharge status: Home / Self Care | Payer: Medicare Other | Source: Ambulatory Visit | Attending: Student | Admitting: Student

## 2018-12-13 ENCOUNTER — Other Ambulatory Visit: Payer: Self-pay

## 2018-12-13 ENCOUNTER — Encounter: Payer: Self-pay | Admitting: *Deleted

## 2018-12-13 DIAGNOSIS — C641 Malignant neoplasm of right kidney, except renal pelvis: Secondary | ICD-10-CM

## 2018-12-13 HISTORY — PX: IR RADIOLOGIST EVAL & MGMT: IMG5224

## 2018-12-13 NOTE — Progress Notes (Signed)
Patient ID: Saniyah Keup, female   DOB: [birth year], 58 y.o.   MRN: [id number]         Chief Complaint: Post repeat right-sided cryoablation  Referring Physician(s): Bell (Urology)  History of Present Illness: Barnette Lofgreen is a 58 y.o. female with past medical history significant for diabetes, hypertension, hyperlipidemia and thyroid cancer who initially underwent image guided right-sided cryoablation on 08/05/2017 of 2 right-sided renal lesion (superior pole and posterior interpolar lesions) who was found to have residual disease involving the superior pole lesion for which she underwent repeat renal cryoablation on 11/15/2018.  Patient is seen today in telemedicine consult for postprocedural evaluation and management.  Patient continues to complain of mild likely activity predominantly activity related right-sided flank pain.  She denies definitive hematuria though does report nocturia.  Patient reports diffuse body pain and fatigue, though these are chronic problems for the patient.  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 "   Asthma    due to allergies    Cancer ([place])    THRYOID 2016 [place], KIDNEY CANCER MAY 2018  RIGHT RENAL CRYOABLATION   Complication of anesthesia    Diabetes mellitus    TYPE 2   Dyspnea    increased exertion    Dysrhythmia    " irregular heart rate per patient"   Family history of adverse reaction to anesthesia    difficult for father to wake after surgery, AFTHER LOW BP WITH ANESTHESIA   Heart murmur    Hypercholesteremia    Hypertension    PONV (postoperative nausea and vomiting)    Thyroid disease    TIA (transient ischemic attack)    LAST TIA  APRIL 2020 ON [facility]    Past Surgical History:  Procedure Laterality Date   ABDOMINAL HYSTERECTOMY     COMPLETE   CHOLECYSTECTOMY     COLON SURGERY     IR RADIOLOGIST EVAL & MGMT  01/12/2017   IR RADIOLOGIST EVAL & MGMT   06/16/2017   IR RADIOLOGIST EVAL & MGMT  08/31/2017   IR RADIOLOGIST EVAL & MGMT  12/13/2017   IR RADIOLOGIST EVAL & MGMT  03/16/2018   IR RADIOLOGIST EVAL & MGMT  11/01/2018   KNEE ARTHROSCOPY     LEFT HEART CATHETERIZATION WITH CORONARY ANGIOGRAM N/A 04/24/2014   Procedure: LEFT HEART CATHETERIZATION WITH CORONARY ANGIOGRAM;  Surgeon: Burnell Blanks, MD;  Location: [facility] CATH LAB;  Service: Cardiovascular;  Laterality: N/A;   RADIOFREQUENCY ABLATION Right 08/05/2017   Procedure: CT RENAL CRYO AND BIOPSY;  Surgeon: Sandi Mariscal, MD;  Location: [facility] ORS;  Service: Anesthesiology;  Laterality: Right;   RADIOFREQUENCY ABLATION Right 11/15/2018   Procedure: RIGHT RENAL CRYOABLATION;  Surgeon: Sandi Mariscal, MD;  Location: [facility] OR[facility];  Service: Anesthesiology;  Laterality: Right;   THYROIDECTOMY N/A 09/20/2014   Procedure: TOTAL THYROIDECTOMY;  Surgeon: Armandina Gemma, MD;  Location: [facility] OR[facility];  Service: General;  Laterality: N/A;   TONSILLECTOMY      Allergies: Lorabid [loracarbef], Augmentin [amoxicillin-pot clavulanate], Codeine, Gadolinium derivatives, Sulfa drugs cross reactors, Benicar [olmesartan medoxomil], and Other  Medications: Prior to Admission medications   Medication Sig Start Date End Date Taking? Authorizing Provider  acetaminophen (TYLENOL) 500 MG tablet Take 2 tablets (1,000 mg total) by mouth every 6 (six) hours as needed (body aches). Patient taking differently: Take 1,000 mg by mouth every 4 (four) hours as needed for moderate  pain or headache (body aches).  06/29/17   Geradine Girt, DO  albuterol (PROVENTIL HFA;VENTOLIN HFA) 108 (90 Base) MCG/ACT inhaler Inhale 1-2 puffs into the lungs every 6 (six) hours as needed for wheezing or shortness of breath. 12/28/17   Jean Rosenthal, MD  b complex vitamins tablet Take 1 tablet by mouth daily.    [provider]  calcium carbonate (OSCAL) 1500 (600 Ca) MG TABS tablet Take by mouth 2 (two) times daily with a meal. MAGNESIUM AND  ZINC 2 IN AM 2 AT 1200 PM 2 AT DINNERTIME    [provider]  clopidogrel (PLAVIX) 75 MG tablet Take 1 tablet (75 mg total) by mouth daily. 09/15/15   Florencia Reasons, MD  diphenhydrAMINE (BENADRYL) 25 MG tablet Take 50 mg by mouth at bedtime.     [provider]  furosemide (LASIX) 40 MG tablet Take 2 tablets (80 mg total) by mouth 2 (two) times daily. 05/17/16   Geradine Girt, DO  ibuprofen (ADVIL) 800 MG tablet Take 800 mg by mouth every 8 (eight) hours as needed for headache or moderate pain.    [provider]  JANUMET 50-1000 MG tablet Take 1 tablet by mouth 2 (two) times daily. 09/16/17   [provider]  levothyroxine (SYNTHROID, LEVOTHROID) 300 MCG tablet Take 300 mcg by mouth daily before breakfast.    [provider]  lisinopril (PRINIVIL,ZESTRIL) 10 MG tablet Take 10 mg by mouth daily.  09/09/14   [provider]  metoprolol tartrate (LOPRESSOR) 100 MG tablet Take 100 mg by mouth 2 (two) times daily. 06/23/16   [provider]  Multiple Vitamin (MULTIVITAMIN PO) Take 1 tablet by mouth daily.    [provider]  Nutritional Supplements (ESTROVEN MAXIMUM STRENGTH PO) Take 1 tablet by mouth daily.    [provider]  omeprazole (PRILOSEC OTC) 20 MG tablet Take 20 mg by mouth daily.    [provider]  ondansetron (ZOFRAN) 8 MG tablet Take 8 mg by mouth every 8 (eight) hours as needed for nausea or vomiting.    [provider]  potassium chloride SA (K-DUR,KLOR-CON) 20 MEQ tablet Take 40 mEq by mouth 2 (two) times daily.     [provider]  pravastatin (PRAVACHOL) 20 MG tablet Take 20 mg by mouth daily.  06/25/13   [provider]  Probiotic Product (PROBIOTIC PO) Take 1 tablet by mouth daily.    [provider]  zinc gluconate 50 MG tablet Take 50 mg by mouth daily.    [provider]     Family History  Problem Relation Age of Onset   Heart failure Mother     Diabetes Mother    Stroke Father    Heart failure Father    Cancer Father     Social History   Socioeconomic History   Marital status: Married    Spouse name: Glendell Docker   Number of children: 2   Years of education: 14   Highest education level: Not on file  Occupational History    Comment: NA, disabled  Social Designer, fashion/clothing strain: Not on file   Food insecurity    Worry: Not on file    Inability: Not on file   Transportation needs    Medical: Not on file    Non-medical: Not on file  Tobacco Use   Smoking status: Never Smoker   Smokeless tobacco: Never Used  Substance and Sexual Activity   Alcohol  use: No   Drug use: No   Sexual activity: Not on file  Lifestyle   Physical activity    Days per week: Not on file    Minutes per session: Not on file   Stress: Not on file  Relationships   Social connections    Talks on phone: Not on file    Gets together: Not on file    Attends religious service: Not on file    Active member of club or organization: Not on file    Attends meetings of clubs or organizations: Not on file    Relationship status: Not on file  Other Topics Concern   Not on file  Social History Narrative   Lives with husband and son    ECOG Status: 1 - Symptomatic but completely ambulatory  Review of Systems  Review of Systems: A 12 point ROS discussed and pertinent positives are indicated in the HPI above.  All other systems are negative.  Physical Exam No direct physical exam was performed (except for noted visual exam findings with Video Visits).   Vital Signs: There were no vitals taken for this visit.  Imaging:  Selected images from right-sided renal cryoablation performed 11/16/2018 as well as CT scan of the abdomen pelvis performed 10/19/2018 were reviewed in detail.  Dg Chest 1 View  Result Date: 11/15/2018 CLINICAL DATA:  Preoperative for renal cryoablation.  Hypertension. EXAM: CHEST  1 VIEW COMPARISON:   December 28, 2017 FINDINGS: Lungs are clear. Heart size and pulmonary vascularity are normal. No adenopathy. No bone lesions. IMPRESSION: No edema or consolidation.  Stable cardiac silhouette. Electronically Signed   By: Lowella Grip III M.D.   On: 11/15/2018 08:11   Ct Guide Tissue Ablation  Result Date: 11/15/2018 CLINICAL DATA:  History of residual/recurrent right-sided renal cell carcinoma. Patient presents today for CT-guided right renal cryoablation. Please refer to formal consultation in the epic EMR dated 11/01/2018 for additional details. EXAM: CT GUIDED RIGHT RENAL CRYOABLATION COMPARISON:  CT abdomen and pelvis-10/19/2018; 03/15/2018; 09/21/2017; abdominal MRI-12/13/2017; CT-guided right renal cryoablation-08/05/2017 ANESTHESIA/SEDATION: General MEDICATIONS: Vancomycin 1.5 IV .The antibiotic was administered in an appropriate time interval prior to needle puncture of the skin. CONTRAST:  166mL OMNIPAQUE IOHEXOL 350 MG/ML SOLN PROCEDURE: The procedure, risks, benefits, and alternatives were explained to the patient. Questions regarding the procedure were encouraged and answered. The patient understands and consents to the procedure. The patient was placed under general anesthesia. Initial un-enhanced CT was performed in a prone position to localize the right kidney and ill-defined hypoattenuating lesion within the superior, medial aspect of the right kidney. The skin overlying midline of the right side of the back was prepped with Betadine in a sterile fashion, and a sterile drape was applied covering the operative field. 22 gauge spinal needle was utilized for trajectory planning purposes. Next, intravenous contrast was administered and arterial and delayed portal venous phase images were obtained. Next, under CT guidance, two 14 gauge IcePearl 2.1 CX cryoablation probes were placed bracketing the superior and inferior aspects of the lesion utilizing a posterior approach. Appropriate position was  confirmed and the cryoablation was performed. Initial 10 minute cycle of cryoablation was performed. This was followed by a 8 minute thaw cycle. A second 10 minute cycle of cryoablation was then performed. During the ablation, periodic CT imaging was performed to monitor ice ball formation and morphology. After a 4 minute thaw, the cryoablation probes were removed following tract ablation. Post-procedural CT was performed. COMPLICATIONS: None immediate. FINDINGS:  Contrast-enhanced imaging demonstrates grossly unchanged size and appearance of the ill-defined approximately 1.6 x 1.5 cm hypoattenuating lesion within the superior, medial aspect of the right kidney (image 21, series 4). Following probe positioning and cryo ablation, the ice ball appears to encompass the entirety of the residual ill-defined hypoattenuating renal lesion (best appreciated on series 15). Postprocedural imaging was negative for complication, specifically, no evidence of a sizable perinephric hematoma. IMPRESSION: Technically successful CT-guided cryoablation of residual lesion within the superomedial aspect of the right kidney. PLAN: - The patient will be seen in 4 weeks either at the interventional radiology clinic or via tele medicine visit with postprocedural BMP. - Initial surveillance imaging will obtained in 3 months (mid November 2020). Again, surveillance imaging will obtained with CT given patient's history allergy to MRI contrast. Electronically Signed   By: Sandi Mariscal M.D.   On: 11/15/2018 13:46    Labs:  CBC: Recent Labs    04/30/18 0538 11/10/18 0906 11/15/18 0720 11/16/18 0408  WBC 7.2 5.8 6.2 8.8  HGB 12.5 12.5 12.7 11.3*  HCT 38.0 39.9 39.6 36.1  PLT 256 228 230 244    COAGS: Recent Labs    04/29/18 2224 11/15/18 0720  INR 1.00 0.9  APTT 28  --     BMP: Recent Labs    11/10/18 0906 11/15/18 0720 11/16/18 0408 12/11/18 0751  NA 138 138 137 140  K 4.3 3.9 3.7 3.8  CL 100 102 103 100  CO2 27  20* 21* 25  GLUCOSE 262* 287* 216* 233*  BUN 21* 21* 22* 17  CALCIUM 8.6* 9.0 8.2* 8.3*  CREATININE 1.29* 1.13* 1.22* 1.15*  GFRNONAA 46* 54* 49* 53*  GFRAA 54* >60 57* 62    LIVER FUNCTION TESTS: Recent Labs    04/29/18 2224  BILITOT 0.5  AST 17  ALT 19  ALKPHOS 68  PROT 7.3  ALBUMIN 3.9    TUMOR MARKERS: No results for input(s): AFPTM, CEA, CA199, CHROMGRNA in the last 8760 hours.  Assessment and Plan:  Jenet Pelc is a 58 y.o. female with past medical history significant for diabetes, hypertension, hyperlipidemia and thyroid cancer who initially underwent image guided right-sided cryoablation on 08/05/2017 of two right-sided renal lesion (superior pole and posterior interpolar lesions) who was found to have residual disease involving the superior pole lesion for which she underwent repeat renal cryoablation on 11/15/2018.  Patient reports mild, expected right-sided flank pain.    I encouraged the patient to increase her activity level.  I additionally advised the patient to only take over-the-counter medicine if her symptoms warrant, as even over-the-counter medicine can have detrimental effects on her liver and kidney function.    The patient demonstrated fair understanding of this conversation.    Plan: - The patient will return to the interventional radiology clinic (either in person or via telemedicine) upon the acquisition of initial postprocedural CT scan (to be obtained 3 months following the repeat cryoablation, in mid November 2020).  Note, patient will undergo surveillance with CT imaging due to questionable allergy to MRI contrast.  The patient knows to call the interventional radiology clinic with any interval questions or concerns.  Thank you for this interesting consult.  I greatly enjoyed meeting Mishka Veerkamp and look forward to participating in their care.  A copy of this report was sent to the requesting provider on this date.  Electronically  Signed: Sandi Mariscal 12/13/2018, 9:18 AM   I spent a total of 10 Minutes in remote  clinical consultation, greater than 50% of which was counseling/coordinating care for right-sided renal cryoablation.    Visit type: Audio only (telephone). Audio (no video) only due to patient's lack of internet/smartphone capability. Alternative for in-person consultation at [facility], [address] [place], [place], [place]. This visit type was conducted due to national recommendations for restrictions regarding the COVID-19 Pandemic (e.g. social distancing).  This format is felt to be most appropriate for this patient at this time.  All issues noted in this document were discussed and addressed.

## 2019-01-18 ENCOUNTER — Other Ambulatory Visit: Payer: Self-pay

## 2019-01-18 ENCOUNTER — Other Ambulatory Visit: Payer: Self-pay | Admitting: Interventional Radiology

## 2019-01-18 DIAGNOSIS — N2889 Other specified disorders of kidney and ureter: Secondary | ICD-10-CM

## 2019-02-12 ENCOUNTER — Other Ambulatory Visit: Payer: Self-pay | Admitting: Gastroenterology

## 2019-02-13 ENCOUNTER — Encounter (HOSPITAL_COMMUNITY): Payer: Self-pay

## 2019-02-13 ENCOUNTER — Other Ambulatory Visit: Payer: Self-pay

## 2019-02-13 ENCOUNTER — Ambulatory Visit (HOSPITAL_COMMUNITY)
Admission: RE | Admit: 2019-02-13 | Discharge: 2019-02-13 | Disposition: A | Discharge status: Home / Self Care | Payer: Medicare Other | Source: Ambulatory Visit | Attending: Interventional Radiology | Admitting: Interventional Radiology

## 2019-02-13 DIAGNOSIS — N2889 Other specified disorders of kidney and ureter: Secondary | ICD-10-CM | POA: Diagnosis present

## 2019-02-13 HISTORY — DX: Malignant neoplasm of right kidney, except renal pelvis: C64.1

## 2019-02-13 LAB — POCT I-STAT CREATININE: Creatinine, Ser: 1 mg/dL (ref 0.44–1.00)

## 2019-02-13 MED ORDER — IOHEXOL 300 MG/ML  SOLN
100.0000 mL | Freq: Once | INTRAMUSCULAR | Status: AC | PRN
  Administered 2019-02-13: 100 mL via INTRAVENOUS

## 2019-02-13 MED ORDER — SODIUM CHLORIDE (PF) 0.9 % IJ SOLN
INTRAMUSCULAR | Status: AC
  Filled 2019-02-13: qty 50

## 2019-02-15 ENCOUNTER — Other Ambulatory Visit: Payer: Self-pay

## 2019-02-15 ENCOUNTER — Ambulatory Visit
Admission: RE | Admit: 2019-02-15 | Discharge: 2019-02-15 | Disposition: A | Discharge status: Home / Self Care | Payer: Medicare Other | Source: Ambulatory Visit | Attending: Interventional Radiology | Admitting: Interventional Radiology

## 2019-02-15 DIAGNOSIS — N2889 Other specified disorders of kidney and ureter: Secondary | ICD-10-CM

## 2019-02-15 HISTORY — PX: IR RADIOLOGIST EVAL & MGMT: IMG5224

## 2019-02-15 NOTE — Progress Notes (Signed)
Patient ID: [name], female   DOB: [birth year], 58 y.o.   MRN: [id number]         Chief Complaint: Post repeat right-sided cryoablation (11/15/2018)  Referring Physician(s): Bell  History of Present Illness: [name] is a 58 y.o. female is a 58 year old female with past medical history significant for diabetes, hypertension, hyperlipidemia and thyroid cancer who initially underwent image guided right-sided renal cryoablation on 08/05/2017 of 2 right-sided renal lesions (superior pole and posterior interpolar lesions) who was found to have residual disease involving the superior pole lesion for which she underwent a repeat renal cryoablation on 11/15/2018.  She is seen in follow-up consultation via telemedicine following the acquisition of initial postprocedural CT scan performed 02/13/2019.  Patient continues to report very mild intermittent right-sided flank pain which again she states is primarily activity related.   Patient also reports experiencing bloody stools and is set to undergo a colonoscopy by Dr. Benson Norway scheduled for early next month.    She is otherwise without complaint.   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 "   Asthma    due to allergies    Cancer ([place])    THRYOID 2016 [place], KIDNEY CANCER MAY 2018  RIGHT RENAL CRYOABLATION   Complication of anesthesia    Diabetes mellitus    TYPE 2   Dyspnea    increased exertion    Dysrhythmia    " irregular heart rate per patient"   Family history of adverse reaction to anesthesia    difficult for father to wake after surgery, AFTHER LOW BP WITH ANESTHESIA   Heart murmur    Hypercholesteremia    Hypertension    PONV (postoperative nausea and vomiting)    Renal cancer, right ([place]) dx'd 07/2018, 10/2018   ablation x 2    Thyroid disease    TIA (transient ischemic attack)    LAST TIA  APRIL 2020 ON PLAVIX    Past Surgical History:    Procedure Laterality Date   ABDOMINAL HYSTERECTOMY     COMPLETE   CHOLECYSTECTOMY     COLON SURGERY     IR RADIOLOGIST EVAL & MGMT  01/12/2017   IR RADIOLOGIST EVAL & MGMT  06/16/2017   IR RADIOLOGIST EVAL & MGMT  08/31/2017   IR RADIOLOGIST EVAL & MGMT  12/13/2017   IR RADIOLOGIST EVAL & MGMT  03/16/2018   IR RADIOLOGIST EVAL & MGMT  11/01/2018   IR RADIOLOGIST EVAL & MGMT  12/13/2018   KNEE ARTHROSCOPY     LEFT HEART CATHETERIZATION WITH CORONARY ANGIOGRAM N/A 04/24/2014   Procedure: LEFT HEART CATHETERIZATION WITH CORONARY ANGIOGRAM;  Surgeon: Burnell Blanks, MD;  Location: [facility] CATH LAB;  Service: Cardiovascular;  Laterality: N/A;   RADIOFREQUENCY ABLATION Right 08/05/2017   Procedure: CT RENAL CRYO AND BIOPSY;  Surgeon: Sandi Mariscal, MD;  Location: [facility] ORS;  Service: Anesthesiology;  Laterality: Right;   RADIOFREQUENCY ABLATION Right 11/15/2018   Procedure: RIGHT RENAL CRYOABLATION;  Surgeon: Sandi Mariscal, MD;  Location: [facility] ORS;  Service: Anesthesiology;  Laterality: Right;   THYROIDECTOMY N/A 09/20/2014   Procedure: TOTAL THYROIDECTOMY;  Surgeon: Armandina Gemma, MD;  Location: [facility] ORS;  Service: General;  Laterality: N/A;   TONSILLECTOMY      Allergies: Lorabid [loracarbef], Augmentin [amoxicillin-pot clavulanate], Codeine, Gadolinium derivatives, Sulfa drugs cross reactors, Benicar [olmesartan medoxomil], and Other  Medications: Prior to Admission medications  Medication Sig Start Date End Date Taking? Authorizing Provider  acetaminophen (TYLENOL) 500 MG tablet Take 2 tablets (1,000 mg total) by mouth every 6 (six) hours as needed (body aches). Patient taking differently: Take 1,000 mg by mouth every 4 (four) hours as needed for moderate pain or headache (body aches).  06/29/17   Geradine Girt, DO  albuterol (PROVENTIL HFA;VENTOLIN HFA) 108 (90 Base) MCG/ACT inhaler Inhale 1-2 puffs into the lungs every 6 (six) hours as needed for wheezing or shortness of breath. 12/28/17    Jean Rosenthal, MD  b complex vitamins tablet Take 1 tablet by mouth daily.    [provider]  calcium carbonate (OSCAL) 1500 (600 Ca) MG TABS tablet Take by mouth 2 (two) times daily with a meal. MAGNESIUM AND ZINC 2 IN AM 2 AT 1200 PM 2 AT DINNERTIME    [provider]  clopidogrel (PLAVIX) 75 MG tablet Take 1 tablet (75 mg total) by mouth daily. 09/15/15   Florencia Reasons, MD  diphenhydrAMINE (BENADRYL) 25 MG tablet Take 50 mg by mouth at bedtime.     [provider]  furosemide (LASIX) 40 MG tablet Take 2 tablets (80 mg total) by mouth 2 (two) times daily. 05/17/16   Geradine Girt, DO  ibuprofen (ADVIL) 800 MG tablet Take 800 mg by mouth every 8 (eight) hours as needed for headache or moderate pain.    [provider]  JANUMET 50-1000 MG tablet Take 1 tablet by mouth 2 (two) times daily. 09/16/17   [provider]  levothyroxine (SYNTHROID, LEVOTHROID) 300 MCG tablet Take 300 mcg by mouth daily before breakfast.    [provider]  lisinopril (PRINIVIL,ZESTRIL) 10 MG tablet Take 10 mg by mouth daily.  09/09/14   [provider]  metoprolol tartrate (LOPRESSOR) 100 MG tablet Take 100 mg by mouth 2 (two) times daily. 06/23/16   [provider]  Multiple Vitamin (MULTIVITAMIN PO) Take 1 tablet by mouth daily.    [provider]  Nutritional Supplements (ESTROVEN MAXIMUM STRENGTH PO) Take 1 tablet by mouth daily.    [provider]  omeprazole (PRILOSEC OTC) 20 MG tablet Take 20 mg by mouth daily.    [provider]  ondansetron (ZOFRAN) 8 MG tablet Take 8 mg by mouth every 8 (eight) hours as needed for nausea or vomiting.    [provider]  potassium chloride SA (K-DUR,KLOR-CON) 20 MEQ tablet Take 40 mEq by mouth 2 (two) times daily.     [provider]  pravastatin (PRAVACHOL) 20 MG tablet Take 20 mg by mouth daily.  06/25/13   [provider]  Probiotic Product (PROBIOTIC PO) Take  1 tablet by mouth daily.    [provider]  zinc gluconate 50 MG tablet Take 50 mg by mouth daily.    [provider]     Family History  Problem Relation Age of Onset   Heart failure Mother    Diabetes Mother    Stroke Father    Heart failure Father    Cancer Father     Social History   Socioeconomic History   Marital status: Married    Spouse name: Glendell Docker   Number of children: 2   Years of education: 14   Highest education level: Not on file  Occupational History    Comment: NA, disabled  Social Designer, fashion/clothing strain: Not on file   Food insecurity    Worry: Not on file  Inability: Not on file   Transportation needs    Medical: Not on file    Non-medical: Not on file  Tobacco Use   Smoking status: Never Smoker   Smokeless tobacco: Never Used  Substance and Sexual Activity   Alcohol use: No   Drug use: No   Sexual activity: Not on file  Lifestyle   Physical activity    Days per week: Not on file    Minutes per session: Not on file   Stress: Not on file  Relationships   Social connections    Talks on phone: Not on file    Gets together: Not on file    Attends religious service: Not on file    Active member of club or organization: Not on file    Attends meetings of clubs or organizations: Not on file    Relationship status: Not on file  Other Topics Concern   Not on file  Social History Narrative   Lives with husband and son    ECOG Status: 1 - Symptomatic but completely ambulatory  Review of Systems  Review of Systems: A 12 point ROS discussed and pertinent positives are indicated in the HPI above.  All other systems are negative.  Physical Exam No direct physical exam was performed (except for noted visual exam findings with Video Visits).   Vital Signs: There were no vitals taken for this visit.  Imaging:  Personal review of CT scan of the abdomen and  pelvis performed 02/13/2019 demonstrates  a technically excellent result without evidence of residual enhancing tissue at the location of the repeat renal cryoablation site.  Ct Abdomen W Wo Contrast  Result Date: 02/13/2019 CLINICAL DATA:  Right renal ablation x2 for renal cell carcinoma. EXAM: CT ABDOMEN WITHOUT AND WITH CONTRAST TECHNIQUE: Multidetector CT imaging of the abdomen was performed following the standard protocol before and following the bolus administration of intravenous contrast. CONTRAST:  189mL OMNIPAQUE IOHEXOL 300 MG/ML  SOLN COMPARISON:  10/19/2018 FINDINGS: Lower chest: Coronary artery calcification is evident. 5 mm left lower lobe nodule (21/5) is similar to prior. Hepatobiliary: The liver shows diffusely decreased attenuation suggesting fat deposition. No suspicious focal abnormality within the liver parenchyma. Subtle nodularity of liver contour with prominent lateral segment left liver raises the question of underlying cirrhosis. Gallbladder is surgically absent. No intrahepatic or extrahepatic biliary dilation. Pancreas: No focal mass lesion. No dilatation of the main duct. No intraparenchymal cyst. No peripancreatic edema. Spleen: No splenomegaly. No focal mass lesion. Adrenals/Urinary Tract: No adrenal nodule or mass. Ablation defect noted anterior aspect of the upper right renal pole with no evidence for abnormal nodular enhancement on the current study. Posterior interpolar ablation defect is stable. 7 mm subcapsular lesion posterior lower pole right kidney (69/16) is too small to characterize but appear similar to prior. 8 mm subcapsular low-density lesion upper pole left kidney is minimally progressed in the interval from 6-7 mm on the prior study. Stomach/Bowel: Tiny hiatal hernia. Stomach otherwise unremarkable. Duodenum is normally positioned as is the ligament of Treitz. No small bowel or colonic dilatation within the visualized abdomen. Vascular/Lymphatic: There is abdominal aortic atherosclerosis without  aneurysm. Portal vein and superior mesenteric vein are patent. Right renal vein is patent. 10 mm short axis portal caval node on 46/11 is stable. 10 mm portacaval node on 53/11 is also unchanged. No para-aortic lymphadenopathy. Other: No intraperitoneal free fluid. Musculoskeletal: No worrisome lytic or sclerotic osseous abnormality. IMPRESSION: 1. Interval ablation of the local recurrence in  the upper pole right kidney. No evidence for suspicious enhancing soft tissue in the upper pole treatment bed on today's study. 2. Stable appearance of the scarring noted previously in the posterior interpolar right kidney. 3. Tiny subcapsular hypodensities inch kidney remain too small to characterize but are not substantially changed in the interval. 4. Stable left lower lobe pulmonary nodule measuring 5 mm today. 5. Hepatic steatosis. Electronically Signed   By: Misty Stanley M.D.   On: 02/13/2019 12:54    Labs:  CBC: Recent Labs    04/30/18 0538 11/10/18 0906 11/15/18 0720 11/16/18 0408  WBC 7.2 5.8 6.2 8.8  HGB 12.5 12.5 12.7 11.3*  HCT 38.0 39.9 39.6 36.1  PLT 256 228 230 244    COAGS: Recent Labs    04/29/18 2224 11/15/18 0720  INR 1.00 0.9  APTT 28  --     BMP: Recent Labs    11/10/18 0906 11/15/18 0720 11/16/18 0408 12/11/18 0751 02/13/19 1003  NA 138 138 137 140  --   K 4.3 3.9 3.7 3.8  --   CL 100 102 103 100  --   CO2 27 20* 21* 25  --   GLUCOSE 262* 287* 216* 233*  --   BUN 21* 21* 22* 17  --   CALCIUM 8.6* 9.0 8.2* 8.3*  --   CREATININE 1.29* 1.13* 1.22* 1.15* 1.00  GFRNONAA 46* 54* 49* 53*  --   GFRAA 54* >60 57* 62  --     LIVER FUNCTION TESTS: Recent Labs    04/29/18 2224  BILITOT 0.5  AST 17  ALT 19  ALKPHOS 68  PROT 7.3  ALBUMIN 3.9    TUMOR MARKERS: No results for input(s): AFPTM, CEA, CA199, CHROMGRNA in the last 8760 hours.  Assessment and Plan:  [name] is a 58 y.o. female is a 58 year old female with past medical history significant for  diabetes, hypertension, hyperlipidemia and thyroid cancer who initially underwent image guided right-sided renal cryoablation on 08/05/2017 of 2 right-sided renal lesions (superior pole and posterior interpolar lesions) who was found to have residual disease involving the superior pole lesion for which she underwent a repeat renal cryoablation on 11/15/2018.  She is seen in follow-up consultation via telemedicine following the acquisition of initial postprocedural CT scan performed 02/13/2019.  Personal review of CT scan of the abdomen and  pelvis performed 02/13/2019 demonstrates a technically excellent result without evidence of residual enhancing tissue at the location of the repeat renal cryoablation site.  As the initial postprocedural scan demonstrated a technically excellent result, we will pursue next surveillance imaging in 6 months (mid May 2021).  Again, as the patient has questionable history of allergy to the MRI contrast agent, surveillance imaging with be performing with renal protocol CT scan.  Plan: Return to clinic following next surveillance renal protocol CT scan of the abdomen pelvis to be performed mid May 2021.  Thank you for this interesting consult.  I greatly enjoyed meeting [name] and look forward to participating in their care.  A copy of this report was sent to the requesting provider on this date.  Electronically Signed: Sandi Mariscal 02/15/2019, 9:28 AM   I spent a total of 10 Minutes in remote  clinical consultation, greater than 50% of which was counseling/coordinating care for post right-sided renal cryoablation.    Visit type: Audio only (telephone). Audio (no video) only due to patient's lack of internet/smartphone capability. Alternative for in-person consultation at [facility], [address]  [place], [place], [place]. This visit type was conducted due to national recommendations for restrictions regarding the COVID-19 Pandemic (e.g. social distancing).   This format is felt to be most appropriate for this patient at this time.  All issues noted in this document were discussed and addressed.

## 2019-02-27 ENCOUNTER — Other Ambulatory Visit (HOSPITAL_COMMUNITY)
Admission: RE | Admit: 2019-02-27 | Discharge: 2019-02-27 | Disposition: A | Discharge status: Home / Self Care | Payer: Medicare Other | Source: Ambulatory Visit | Attending: Gastroenterology | Admitting: Gastroenterology

## 2019-02-27 DIAGNOSIS — Z20828 Contact with and (suspected) exposure to other viral communicable diseases: Secondary | ICD-10-CM | POA: Diagnosis not present

## 2019-02-27 DIAGNOSIS — Z01812 Encounter for preprocedural laboratory examination: Secondary | ICD-10-CM | POA: Diagnosis present

## 2019-02-28 ENCOUNTER — Encounter (HOSPITAL_COMMUNITY): Payer: Self-pay | Admitting: Emergency Medicine

## 2019-02-28 ENCOUNTER — Other Ambulatory Visit: Payer: Self-pay

## 2019-02-28 NOTE — Progress Notes (Signed)
   Pre-op endo call completed.   HOH  TIA 04/2018 reports no residual effects Cardiologist is Dr Alphia Moh  PLAVIX on hold for upcoming procedure

## 2019-03-01 LAB — NOVEL CORONAVIRUS, NAA (HOSP ORDER, SEND-OUT TO REF LAB; TAT 18-24 HRS): SARS-CoV-2, NAA: NOT DETECTED

## 2019-03-02 ENCOUNTER — Ambulatory Visit (HOSPITAL_COMMUNITY)
Admission: AC | Admit: 2019-03-02 | Discharge: 2019-03-02 | Disposition: A | Discharge status: Home / Self Care | Payer: Medicare Other | Attending: Gastroenterology | Admitting: Gastroenterology

## 2019-03-02 ENCOUNTER — Other Ambulatory Visit: Payer: Self-pay

## 2019-03-02 ENCOUNTER — Encounter (HOSPITAL_COMMUNITY): Payer: Self-pay | Admitting: *Deleted

## 2019-03-02 ENCOUNTER — Ambulatory Visit (HOSPITAL_COMMUNITY): Payer: Medicare Other | Admitting: Anesthesiology

## 2019-03-02 ENCOUNTER — Encounter (HOSPITAL_COMMUNITY)
Admission: AC | Disposition: A | Discharge status: Home / Self Care | Payer: Self-pay | Source: Home / Self Care | Attending: Gastroenterology

## 2019-03-02 DIAGNOSIS — R197 Diarrhea, unspecified: Secondary | ICD-10-CM | POA: Diagnosis not present

## 2019-03-02 DIAGNOSIS — E039 Hypothyroidism, unspecified: Secondary | ICD-10-CM | POA: Insufficient documentation

## 2019-03-02 DIAGNOSIS — Z8673 Personal history of transient ischemic attack (TIA), and cerebral infarction without residual deficits: Secondary | ICD-10-CM | POA: Diagnosis not present

## 2019-03-02 DIAGNOSIS — D175 Benign lipomatous neoplasm of intra-abdominal organs: Secondary | ICD-10-CM | POA: Insufficient documentation

## 2019-03-02 DIAGNOSIS — I2510 Atherosclerotic heart disease of native coronary artery without angina pectoris: Secondary | ICD-10-CM | POA: Insufficient documentation

## 2019-03-02 DIAGNOSIS — Z79899 Other long term (current) drug therapy: Secondary | ICD-10-CM | POA: Diagnosis not present

## 2019-03-02 DIAGNOSIS — Z7902 Long term (current) use of antithrombotics/antiplatelets: Secondary | ICD-10-CM | POA: Diagnosis not present

## 2019-03-02 DIAGNOSIS — M19079 Primary osteoarthritis, unspecified ankle and foot: Secondary | ICD-10-CM | POA: Insufficient documentation

## 2019-03-02 DIAGNOSIS — J45909 Unspecified asthma, uncomplicated: Secondary | ICD-10-CM | POA: Insufficient documentation

## 2019-03-02 DIAGNOSIS — D123 Benign neoplasm of transverse colon: Secondary | ICD-10-CM | POA: Insufficient documentation

## 2019-03-02 DIAGNOSIS — Z85528 Personal history of other malignant neoplasm of kidney: Secondary | ICD-10-CM | POA: Diagnosis not present

## 2019-03-02 DIAGNOSIS — Z882 Allergy status to sulfonamides status: Secondary | ICD-10-CM | POA: Insufficient documentation

## 2019-03-02 DIAGNOSIS — Z880 Allergy status to penicillin: Secondary | ICD-10-CM | POA: Diagnosis not present

## 2019-03-02 DIAGNOSIS — Z8249 Family history of ischemic heart disease and other diseases of the circulatory system: Secondary | ICD-10-CM | POA: Insufficient documentation

## 2019-03-02 DIAGNOSIS — Z833 Family history of diabetes mellitus: Secondary | ICD-10-CM | POA: Diagnosis not present

## 2019-03-02 DIAGNOSIS — Z85850 Personal history of malignant neoplasm of thyroid: Secondary | ICD-10-CM | POA: Insufficient documentation

## 2019-03-02 DIAGNOSIS — Z79890 Hormone replacement therapy (postmenopausal): Secondary | ICD-10-CM | POA: Insufficient documentation

## 2019-03-02 DIAGNOSIS — I10 Essential (primary) hypertension: Secondary | ICD-10-CM | POA: Diagnosis not present

## 2019-03-02 DIAGNOSIS — E119 Type 2 diabetes mellitus without complications: Secondary | ICD-10-CM | POA: Diagnosis not present

## 2019-03-02 DIAGNOSIS — Z881 Allergy status to other antibiotic agents status: Secondary | ICD-10-CM | POA: Insufficient documentation

## 2019-03-02 DIAGNOSIS — Z800 Family history of malignant neoplasm of digestive organs: Secondary | ICD-10-CM | POA: Diagnosis not present

## 2019-03-02 DIAGNOSIS — Z888 Allergy status to other drugs, medicaments and biological substances status: Secondary | ICD-10-CM | POA: Insufficient documentation

## 2019-03-02 DIAGNOSIS — Z885 Allergy status to narcotic agent status: Secondary | ICD-10-CM | POA: Diagnosis not present

## 2019-03-02 DIAGNOSIS — K921 Melena: Secondary | ICD-10-CM | POA: Insufficient documentation

## 2019-03-02 DIAGNOSIS — E7800 Pure hypercholesterolemia, unspecified: Secondary | ICD-10-CM | POA: Insufficient documentation

## 2019-03-02 HISTORY — DX: Unspecified hearing loss, unspecified ear: H91.90

## 2019-03-02 HISTORY — PX: BIOPSY: SHX5522

## 2019-03-02 HISTORY — PX: COLONOSCOPY WITH PROPOFOL: SHX5780

## 2019-03-02 HISTORY — PX: POLYPECTOMY: SHX5525

## 2019-03-02 LAB — GLUCOSE, CAPILLARY: Glucose-Capillary: 229 mg/dL — ABNORMAL HIGH (ref 70–99)

## 2019-03-02 SURGERY — COLONOSCOPY WITH PROPOFOL
Anesthesia: Monitor Anesthesia Care

## 2019-03-02 MED ORDER — PROPOFOL 500 MG/50ML IV EMUL
INTRAVENOUS | Status: DC | PRN
  Administered 2019-03-02: 100 ug/kg/min via INTRAVENOUS

## 2019-03-02 MED ORDER — LACTATED RINGERS IV SOLN
INTRAVENOUS | Status: DC
  Administered 2019-03-02: 11:00:00 1000 mL via INTRAVENOUS

## 2019-03-02 MED ORDER — PROPOFOL 10 MG/ML IV BOLUS
INTRAVENOUS | Status: AC
  Filled 2019-03-02: qty 20

## 2019-03-02 MED ORDER — PROPOFOL 10 MG/ML IV BOLUS
INTRAVENOUS | Status: DC | PRN
  Administered 2019-03-02 (×3): 10 mg via INTRAVENOUS
  Administered 2019-03-02: 20 mg via INTRAVENOUS

## 2019-03-02 MED ORDER — LIDOCAINE HCL 1 % IJ SOLN
INTRAMUSCULAR | Status: DC | PRN
  Administered 2019-03-02: 50 mg via INTRADERMAL

## 2019-03-02 MED ORDER — PROPOFOL 500 MG/50ML IV EMUL
INTRAVENOUS | Status: AC
  Filled 2019-03-02: qty 50

## 2019-03-02 MED ORDER — SODIUM CHLORIDE 0.9 % IV SOLN: INTRAVENOUS | Status: DC

## 2019-03-02 SURGICAL SUPPLY — 22 items

## 2019-03-02 NOTE — Op Note (Signed)
[facility] Patient Name: [name] Procedure Date: 03/02/2019 MRN: [id number] Attending MD: Carol Ada , MD Date of Birth: [birth year] CSN: [id number] Age: 58 Admit Type: Outpatient Procedure:                Colonoscopy Indications:              Hematochezia, Diarrhea Providers:                Carol Ada, MD, Carmie End, RN, William Dalton, Technician Referring MD:              Medicines:                Propofol per Anesthesia Complications:            No immediate complications. Estimated Blood Loss:     Estimated blood loss was minimal. Procedure:                Pre-Anesthesia Assessment:                           - Prior to the procedure, a History and Physical                            was performed, and patient medications and                            allergies were reviewed. The patient's tolerance of                            previous anesthesia was also reviewed. The risks                            and benefits of the procedure and the sedation                            options and risks were discussed with the patient.                            All questions were answered, and informed consent                            was obtained. Prior Anticoagulants: The patient has                            taken no previous anticoagulant or antiplatelet                            agents. ASA Grade Assessment: III - A patient with                            severe systemic disease. After reviewing the risks                            and benefits,  the patient was deemed in                            satisfactory condition to undergo the procedure.                           - Sedation was administered by an anesthesia                            professional. Deep sedation was attained.                           After obtaining informed consent, the colonoscope                            was passed under direct vision.  Throughout the                            procedure, the patient's blood pressure, pulse, and                            oxygen saturations were monitored continuously. The                            [id number] Olympus colonoscope was                            introduced through the anus and advanced to the the                            cecum, identified by appendiceal orifice and                            ileocecal valve. The colonoscopy was performed                            without difficulty. The patient tolerated the                            procedure well. The quality of the bowel                            preparation was excellent. The ileocecal valve,                            appendiceal orifice, and rectum were photographed. Findings:      A 3 mm polyp was found in the transverse colon. The polyp was sessile.       The polyp was removed with a cold snare. Resection and retrieval were       complete.      There was a medium-sized lipoma, in the ascending colon.      Normal mucosa was found in the entire colon. Biopsies for histology were       taken with a cold forceps from the entire colon for evaluation of  microscopic colitis. Impression:               - One 3 mm polyp in the transverse colon, removed                            with a cold snare. Resected and retrieved.                           - Medium-sized lipoma in the ascending colon.                           - Normal mucosa in the entire examined colon.                            Biopsied. Moderate Sedation:      Not Applicable - Patient had care per Anesthesia. Recommendation:           - Patient has a contact number available for                            emergencies. The signs and symptoms of potential                            delayed complications were discussed with the                            patient. Return to normal activities tomorrow.                            Written discharge  instructions were provided to the                            patient.                           - Resume previous diet.                           - Continue present medications.                           - Await pathology results.                           - Repeat colonoscopy in 5 years for surveillance.                           - Return to GI clinic in 4 weeks. Procedure Code(s):        --- Professional ---                           [phone], Colonoscopy, flexible; with removal of                            tumor(s), polyp(s), or other lesion(s) by snare  technique                           [id number], 59, Colonoscopy, flexible; with biopsy,                            single or multiple Diagnosis Code(s):        --- Professional ---                           K63.5, Polyp of colon                           D17.5, Benign lipomatous neoplasm of                            intra-abdominal organs                           K92.1, Melena (includes Hematochezia)                           R19.7, Diarrhea, unspecified CPT copyright 2019 American Medical Association. All rights reserved. The codes documented in this report are preliminary and upon coder review may  be revised to meet current compliance requirements. Carol Ada, MD Carol Ada, MD 03/02/2019 12:28:19 PM This report has been signed electronically. Number of Addenda: 0

## 2019-03-02 NOTE — H&P (Signed)
[name] HPI: Thirteen years ago the patient underwent a colonoscopy for bleeding and the examination was normal. Starting four months ago the patient started to have a recurrence of her hematochezia. The bleeding can occur with both bowel movements and urination. There are no reports of proctalgia, but she has some lower abdominal pain with bowel movements. The patient denies any issues with constipation as her stools are loose and runny. She was diagnosed with a right renal cancer last year and she has undergone cryoablation. There was also a history of thyroid cancer in 2016. Her father had colon cancer in his 53's and she was reminded to follow up in the office in 2012, but she did not up. The patient denies any issues with chest pain, SOB, or MI. She does suffer with TIAs.  Past Medical History:  Diagnosis Date  . Anemia    hx of   . 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 Asthma    due to allergies   . Cancer ([place])    THRYOID 2016 [place] WITH SURGERY, KIDNEY CANCER MAY 2018  RIGHT RENAL CRYOABLATION  . Complication of anesthesia   . Diabetes mellitus    TYPE 2  . Dyspnea    increased exertion   . Dysrhythmia    " irregular heart rate per patient"  . Family history of adverse reaction to anesthesia    difficult for father to wake after surgery, AFTHER LOW BP WITH ANESTHESIA  . Heart murmur   . HOH (hard of hearing)   . Hypercholesteremia   . Hypertension   . PONV (postoperative nausea and vomiting)   . Renal cancer, right ([place]) dx'd 07/2018, 10/2018   ablation x 2   . Thyroid disease   . TIA (transient ischemic attack)    LAST TIA  APRIL 2020 ON PLAVIX, reports residual effects    Past Surgical History:  Procedure Laterality Date  . ABDOMINAL HYSTERECTOMY     COMPLETE  . CHOLECYSTECTOMY    . COLON SURGERY    . ear surgeries     . IR RADIOLOGIST EVAL & MGMT  01/12/2017  . IR RADIOLOGIST EVAL & MGMT  06/16/2017  . IR RADIOLOGIST EVAL & MGMT  08/31/2017  . IR RADIOLOGIST EVAL &  MGMT  12/13/2017  . IR RADIOLOGIST EVAL & MGMT  03/16/2018  . IR RADIOLOGIST EVAL & MGMT  11/01/2018  . IR RADIOLOGIST EVAL & MGMT  12/13/2018  . IR RADIOLOGIST EVAL & MGMT  02/15/2019  . KNEE ARTHROSCOPY    . LEFT HEART CATHETERIZATION WITH CORONARY ANGIOGRAM N/A 04/24/2014   Procedure: LEFT HEART CATHETERIZATION WITH CORONARY ANGIOGRAM;  Surgeon: Burnell Blanks, MD;  Location: [facility] CATH LAB;  Service: Cardiovascular;  Laterality: N/A;  . RADIOFREQUENCY ABLATION Right 08/05/2017   Procedure: CT RENAL CRYO AND BIOPSY;  Surgeon: Sandi Mariscal, MD;  Location: [facility]S;  Service: Anesthesiology;  Laterality: Right;  . RADIOFREQUENCY ABLATION Right 11/15/2018   Procedure: RIGHT RENAL CRYOABLATION;  Surgeon: Sandi Mariscal, MD;  Location: [facility] ORS;  Service: Anesthesiology;  Laterality: Right;  . THYROIDECTOMY N/A 09/20/2014   Procedure: TOTAL THYROIDECTOMY;  Surgeon: Armandina Gemma, MD;  Location: [facility] ORS;  Service: General;  Laterality: N/A;  . TONSILLECTOMY      Family History  Problem Relation Age of Onset  . Heart failure Mother   . Diabetes Mother   . Stroke Father   . Heart failure Father   . Cancer Father  Social History:  reports that she has never smoked. She has never used smokeless tobacco. She reports that she does not drink alcohol or use drugs.  Allergies:  Allergies  Allergen Reactions  . Lorabid [Loracarbef] Anaphylaxis  . Augmentin [Amoxicillin-Pot Clavulanate] Hives, Nausea And Vomiting and Other (See Comments)    Chest pain  . Codeine Nausea And Vomiting  . Gadolinium Derivatives Itching, Swelling and Other (See Comments)    Patient will require 13 hr prep prior to administration of gadolinium    . Sulfa Drugs Cross Reactors Other (See Comments)    Severe allergic reaction as a child - was not told symptoms  . Benicar [Olmesartan Medoxomil] Swelling and Rash  . Other Rash    Green peas    Medications: Scheduled: Continuous:  No results found for this or any previous  visit (from the past 24 hour(s)).   No results found.  ROS:  As stated above in the HPI otherwise negative.  There were no vitals taken for this visit.    PE: Gen: NAD, Alert and Oriented HEENT:  [place]/AT, EOMI Neck: Supple, no LAD Lungs: CTA Bilaterally CV: RRR without M/G/R ABM: Soft, NTND, +BS Ext: No C/C/E  Assessment/Plan: 1) Rectal bleeding. 2) Family history of colon cancer in her father.  Plan: 1) Colonoscopy.  Devansh Riese D 03/02/2019, 10:36 AM

## 2019-03-02 NOTE — Anesthesia Postprocedure Evaluation (Signed)
Anesthesia Post Note  Patient: [name]  Procedure(s) Performed: COLONOSCOPY WITH PROPOFOL (N/A ) POLYPECTOMY BIOPSY     Patient location during evaluation: PACU Anesthesia Type: MAC Level of consciousness: awake and alert Pain management: pain level controlled Vital Signs Assessment: post-procedure vital signs reviewed and stable Respiratory status: spontaneous breathing, nonlabored ventilation and respiratory function stable Cardiovascular status: stable and blood pressure returned to baseline Postop Assessment: no apparent nausea or vomiting Anesthetic complications: no    Last Vitals:  Vitals:   03/02/19 1250 03/02/19 1300  BP: (!) 193/121 (!) 153/89  Pulse: 72 72  Resp: 20 18  SpO2: 98% 97%    Last Pain:  Vitals:   03/02/19 1229  TempSrc: Oral  PainSc: 0-No pain                 Catalina Gravel

## 2019-03-02 NOTE — Transfer of Care (Signed)
Immediate Anesthesia Transfer of Care Note  Patient: [name]  Procedure(s) Performed: COLONOSCOPY WITH PROPOFOL (N/A ) POLYPECTOMY BIOPSY  Patient Location: PACU and Endoscopy Unit  Anesthesia Type:MAC  Level of Consciousness: awake, alert , oriented and patient cooperative  Airway & Oxygen Therapy: Patient Spontanous Breathing and Patient connected to face mask oxygen  Post-op Assessment: Report given to RN and Post -op Vital signs reviewed and stable  Post vital signs: Reviewed and stable  Last Vitals:  Vitals Value Taken Time  BP    Temp    Pulse    Resp    SpO2      Last Pain:  Vitals:   03/02/19 1107  TempSrc: Oral         Complications: No apparent anesthesia complications

## 2019-03-02 NOTE — Discharge Instructions (Signed)

## 2019-03-02 NOTE — Anesthesia Preprocedure Evaluation (Addendum)
Anesthesia Evaluation  Patient identified by MRN, date of birth, ID band Patient awake    Reviewed: Allergy & Precautions, NPO status , Patient's Chart, lab work & pertinent test results  History of Anesthesia Complications (+) PONV, Family history of anesthesia reaction and history of anesthetic complications  Airway Mallampati: II  TM Distance: >3 FB Neck ROM: Full    Dental  (+) Dental Advisory Given, Poor Dentition, Missing, Chipped   Pulmonary asthma ,    Pulmonary exam normal breath sounds clear to auscultation       Cardiovascular hypertension, + angina + CAD  Normal cardiovascular exam Rhythm:Regular Rate:Normal     Neuro/Psych TIA   GI/Hepatic negative GI ROS, Neg liver ROS,   Endo/Other  diabetesHypothyroidism   Renal/GU Renal disease     Musculoskeletal negative musculoskeletal ROS (+)   Abdominal   Peds  Hematology  (+) Blood dyscrasia (Plavix), ,   Anesthesia Other Findings Day of surgery medications reviewed with the patient.  Reproductive/Obstetrics                            Anesthesia Physical Anesthesia Plan  ASA: III  Anesthesia Plan: MAC   Post-op Pain Management:    Induction: Intravenous  PONV Risk Score and Plan: 3 and Propofol infusion and Treatment may vary due to age or medical condition  Airway Management Planned: Natural Airway and Nasal Cannula  Additional Equipment:   Intra-op Plan:   Post-operative Plan:   Informed Consent: I have reviewed the patients History and Physical, chart, labs and discussed the procedure including the risks, benefits and alternatives for the proposed anesthesia with the patient or authorized representative who has indicated his/her understanding and acceptance.     Dental advisory given  Plan Discussed with: CRNA  Anesthesia Plan Comments:        Anesthesia Quick Evaluation

## 2019-03-05 LAB — SURGICAL PATHOLOGY

## 2019-04-04 ENCOUNTER — Other Ambulatory Visit: Payer: Self-pay | Admitting: Cardiology

## 2019-04-04 DIAGNOSIS — Z20822 Contact with and (suspected) exposure to covid-19: Secondary | ICD-10-CM

## 2019-04-06 LAB — NOVEL CORONAVIRUS, NAA: SARS-CoV-2, NAA: NOT DETECTED

## 2019-04-07 ENCOUNTER — Telehealth: Payer: Self-pay | Admitting: General Practice

## 2019-04-07 NOTE — Telephone Encounter (Signed)
Negative COVID results given. Patient results "NOT Detected." Caller expressed understanding. ° °

## 2019-05-11 IMAGING — DX DG LUMBAR SPINE COMPLETE 4+V
5 series · 5 of 5 positions shown · non-contrast
Comparison: 09/21/2017 CT.

CLINICAL DATA: 57-year-old female with low back pain extending to
legs for the past 2-3 weeks. No known injury. Initial encounter.

EXAM:
LUMBAR SPINE - COMPLETE 4+ VIEW

[dg lumbar spine complete 4 +v (1 of 5)]
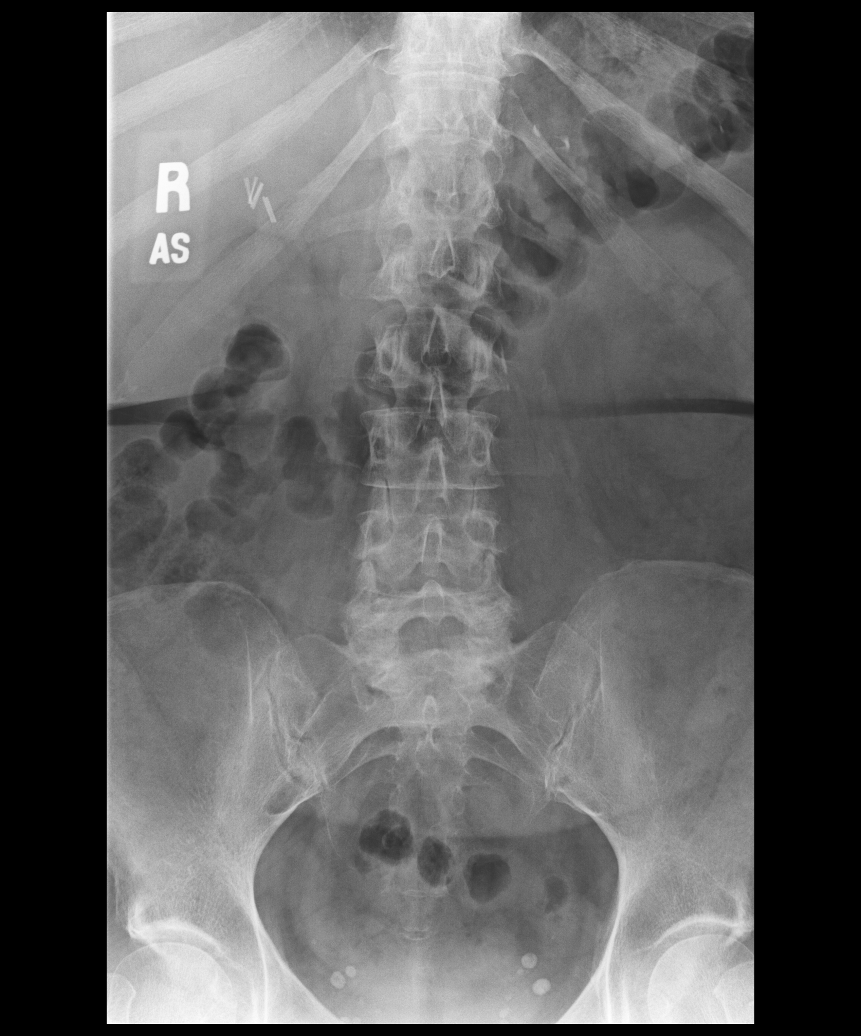

[dg lumbar spine complete 4 +v (2 of 5)]
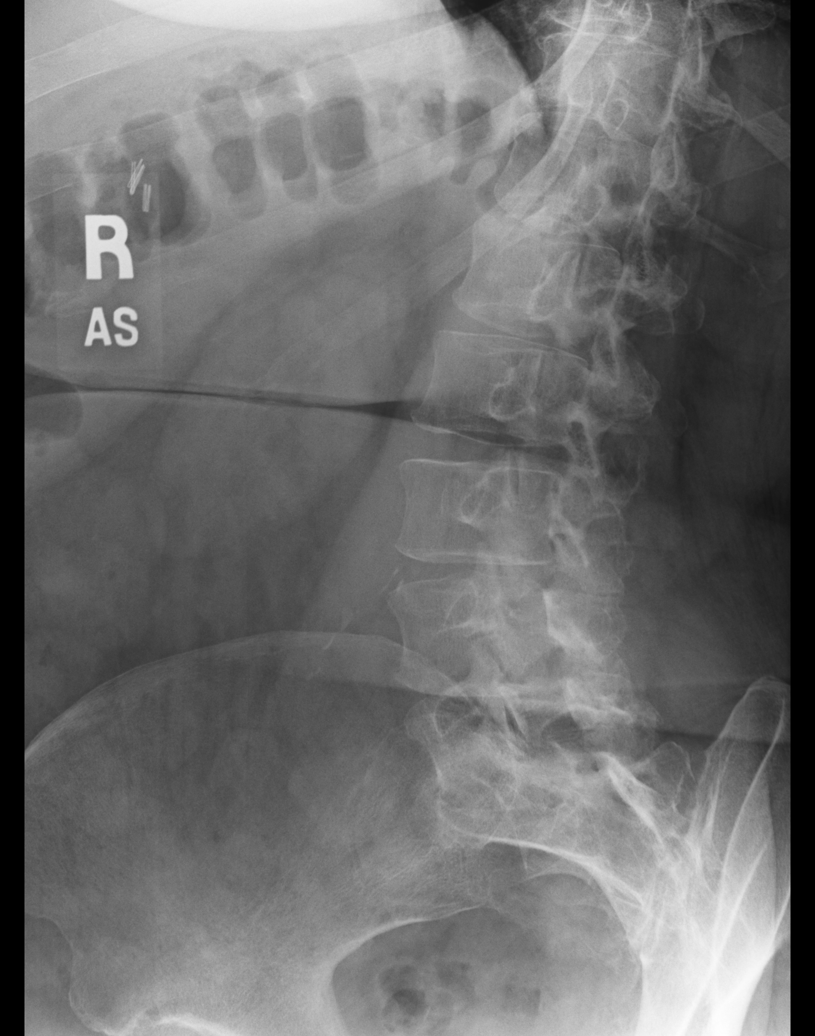

[dg lumbar spine complete 4 +v (3 of 5)]
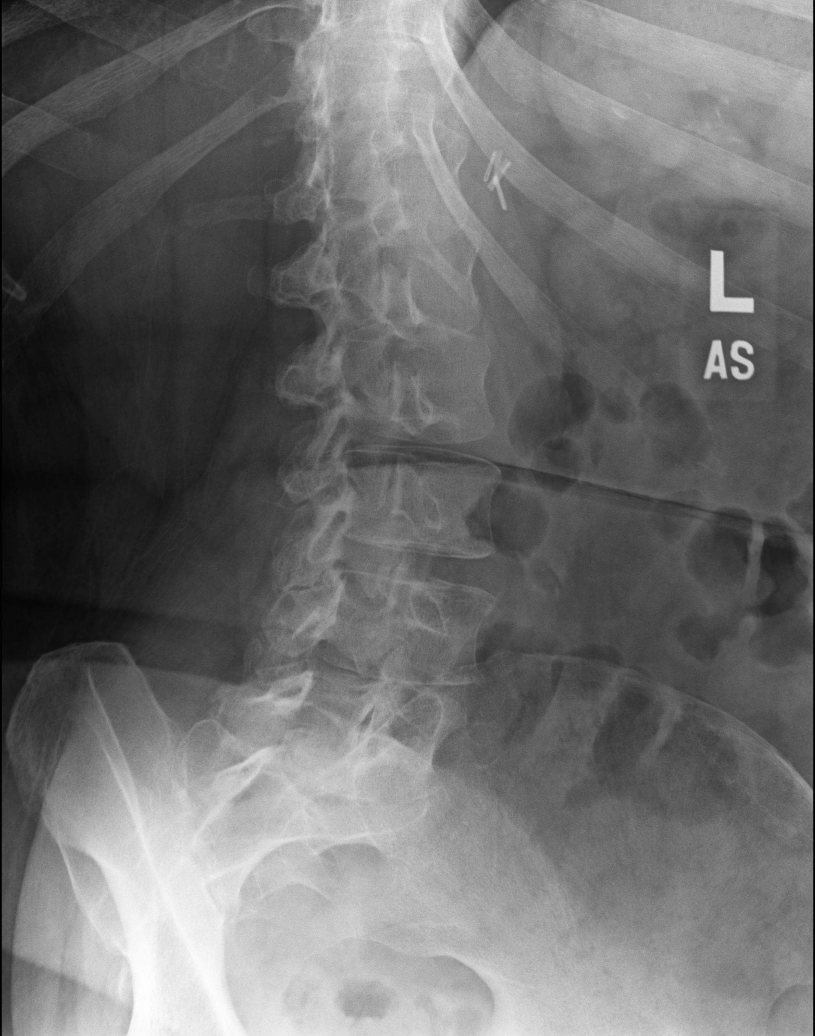

[dg lumbar spine complete 4 +v (4 of 5)]
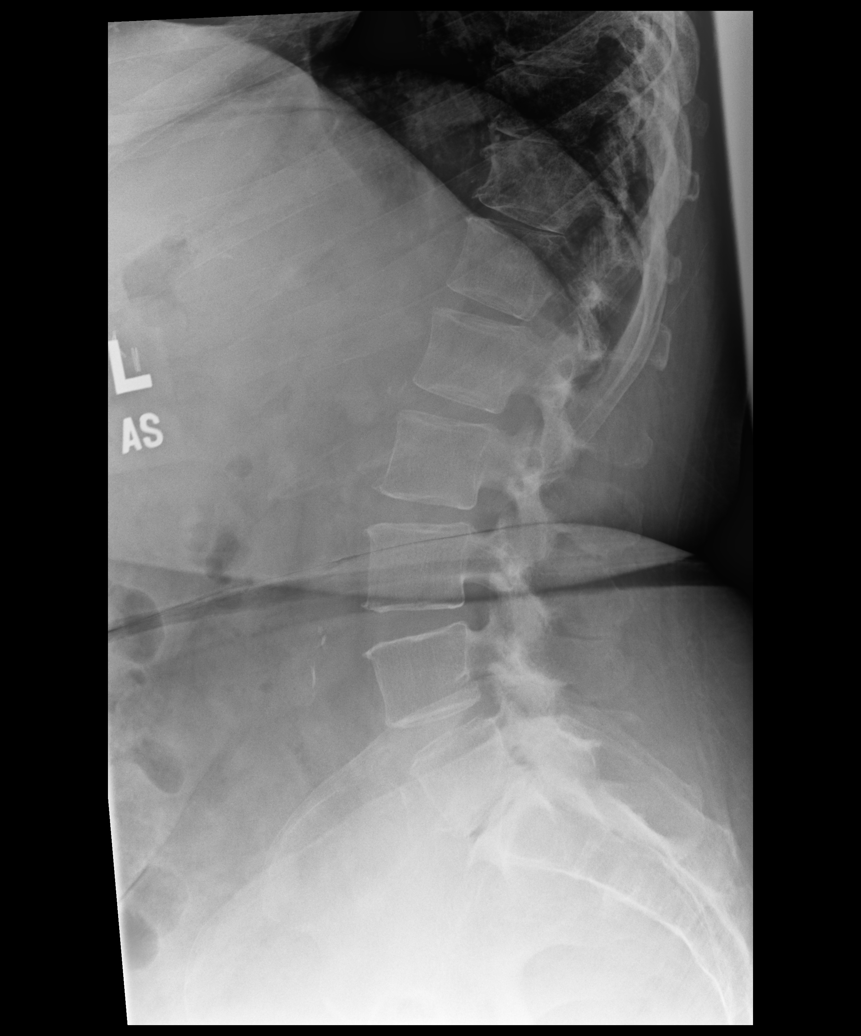

[dg lumbar spine complete 4 +v (5 of 5)]
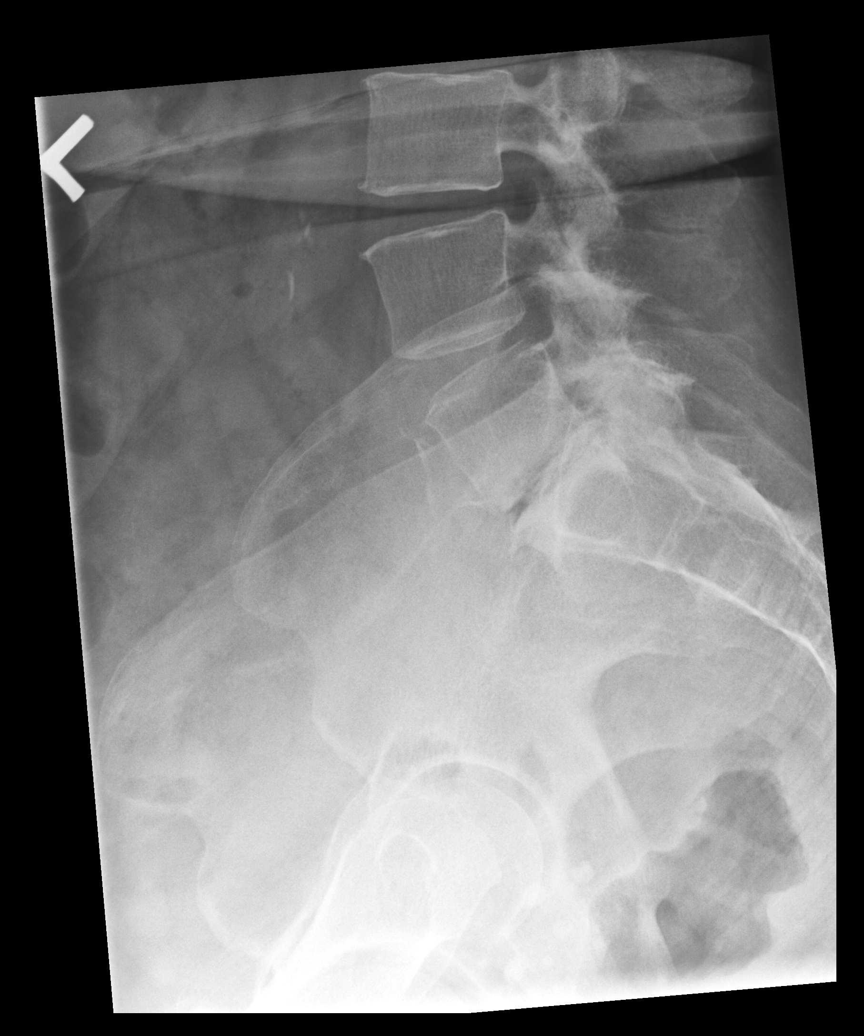

[5 of 5 positions shown; findings below may reference images not displayed]

FINDINGS: Moderate L5-S1 facet degenerative changes. Moderate to marked L5-S1
disc space narrowing with vacuum disc and surrounding endplate
sclerosis.

Minimal L4-5 disc space narrowing.

Mild degenerative changes lower thoracic spine.

Normal alignment.  No compression fracture.

Sacroiliac joints intact.

Vascular calcifications.
IMPRESSION: 1. Moderate L5-S1 facet degenerative changes. Moderate to marked
L5-S1 disc degeneration with disc space narrowing, endplate
sclerosis, osteophyte and vacuum disc.
2. Minimal L4-5 disc space narrowing.
3. Mild degenerative changes lower thoracic spine.
4.  Aortic Atherosclerosis ([id number]).

## 2019-07-04 ENCOUNTER — Other Ambulatory Visit: Payer: Self-pay | Admitting: Interventional Radiology

## 2019-07-04 DIAGNOSIS — N2889 Other specified disorders of kidney and ureter: Secondary | ICD-10-CM

## 2019-07-11 ENCOUNTER — Other Ambulatory Visit: Payer: Self-pay

## 2019-07-11 DIAGNOSIS — N2889 Other specified disorders of kidney and ureter: Secondary | ICD-10-CM

## 2019-08-10 ENCOUNTER — Ambulatory Visit (HOSPITAL_COMMUNITY)
Admission: RE | Admit: 2019-08-10 | Discharge: 2019-08-10 | Disposition: A | Discharge status: Home / Self Care | Payer: Medicare Other | Source: Ambulatory Visit | Attending: Interventional Radiology | Admitting: Interventional Radiology

## 2019-08-10 ENCOUNTER — Other Ambulatory Visit: Payer: Self-pay

## 2019-08-10 DIAGNOSIS — N2889 Other specified disorders of kidney and ureter: Secondary | ICD-10-CM | POA: Diagnosis not present

## 2019-08-10 LAB — POCT I-STAT CREATININE: Creatinine, Ser: 1.2 mg/dL — ABNORMAL HIGH (ref 0.44–1.00)

## 2019-08-10 MED ORDER — SODIUM CHLORIDE (PF) 0.9 % IJ SOLN
INTRAMUSCULAR | Status: AC
  Filled 2019-08-10: qty 50

## 2019-08-10 MED ORDER — IOHEXOL 300 MG/ML  SOLN
100.0000 mL | Freq: Once | INTRAMUSCULAR | Status: AC | PRN
  Administered 2019-08-10: 100 mL via INTRAVENOUS

## 2019-08-16 ENCOUNTER — Ambulatory Visit
Admission: RE | Admit: 2019-08-16 | Discharge: 2019-08-16 | Disposition: A | Discharge status: Home / Self Care | Payer: Medicare Other | Source: Ambulatory Visit | Attending: Interventional Radiology | Admitting: Interventional Radiology

## 2019-08-16 ENCOUNTER — Encounter: Payer: Self-pay | Admitting: *Deleted

## 2019-08-16 ENCOUNTER — Other Ambulatory Visit: Payer: Self-pay

## 2019-08-16 DIAGNOSIS — N2889 Other specified disorders of kidney and ureter: Secondary | ICD-10-CM

## 2019-08-16 HISTORY — PX: IR RADIOLOGIST EVAL & MGMT: IMG5224

## 2019-08-16 NOTE — Progress Notes (Signed)
Patient ID: [name], female   DOB: 13-Dec-1960, 59 y.o.   MRN: [id number]         Chief Complaint: Post repeat right-sided cryoablation (11/15/2018)  Referring Physician(s): Bell  History of Present Illness:  [name] is a 59 y.o. female is a 59 year old female with past medical history significant for diabetes, hypertension, hyperlipidemia and thyroid cancer who initially underwent image guided right-sided renal cryoablation on 08/05/2017 of 2 right-sided renal lesions (superior pole and posterior interpolar lesions) who was found to have residual disease involving the superior pole lesion for which she underwent a repeat renal cryoablation on 11/15/2018.    She is seen in follow-up consultation via telemedicine following the acquisition of postprocedural CT scan performed 08/10/2019.  Patient continues to report very mild intermittent right-sided flank pain.  Patient complains of intermittent bladder spasms as well as hematuria  She is otherwise without complaint.   Past Medical History:  Diagnosis Date  . Anemia    hx of   . 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 Asthma    due to allergies   . Cancer ([place])    THRYOID 2016 [place] WITH SURGERY, KIDNEY CANCER MAY 2018  RIGHT RENAL CRYOABLATION  . Complication of anesthesia   . Diabetes mellitus    TYPE 2  . Dyspnea    increased exertion   . Dysrhythmia    " irregular heart rate per patient"  . Family history of adverse reaction to anesthesia    difficult for father to wake after surgery, AFTHER LOW BP WITH ANESTHESIA  . Heart murmur   . HOH (hard of hearing)   . Hypercholesteremia   . Hypertension   . PONV (postoperative nausea and vomiting)   . Renal cancer, right ([place]) dx'd 07/2018, 10/2018   ablation x 2   . Thyroid disease   . TIA (transient ischemic attack)    LAST TIA  APRIL 2020 ON PLAVIX, reports residual effects    Past Surgical History:  Procedure Laterality Date  . ABDOMINAL HYSTERECTOMY     COMPLETE  .  BIOPSY  03/02/2019   Procedure: BIOPSY;  Surgeon: Carol Ada, MD;  Location: [facility]COPY;  Service: Endoscopy;;  . CHOLECYSTECTOMY    . COLON SURGERY    . COLONOSCOPY WITH PROPOFOL N/A 03/02/2019   Procedure: COLONOSCOPY WITH PROPOFOL;  Surgeon: Carol Ada, MD;  Location: [facility];  Service: Endoscopy;  Laterality: N/A;  . ear surgeries     . IR RADIOLOGIST EVAL & MGMT  01/12/2017  . IR RADIOLOGIST EVAL & MGMT  06/16/2017  . IR RADIOLOGIST EVAL & MGMT  08/31/2017  . IR RADIOLOGIST EVAL & MGMT  12/13/2017  . IR RADIOLOGIST EVAL & MGMT  03/16/2018  . IR RADIOLOGIST EVAL & MGMT  11/01/2018  . IR RADIOLOGIST EVAL & MGMT  12/13/2018  . IR RADIOLOGIST EVAL & MGMT  02/15/2019  . KNEE ARTHROSCOPY    . LEFT HEART CATHETERIZATION WITH CORONARY ANGIOGRAM N/A 04/24/2014   Procedure: LEFT HEART CATHETERIZATION WITH CORONARY ANGIOGRAM;  Surgeon: Burnell Blanks, MD;  Location: [facility] CATH LAB;  Service: Cardiovascular;  Laterality: N/A;  . POLYPECTOMY  03/02/2019   Procedure: POLYPECTOMY;  Surgeon: Carol Ada, MD;  Location: [facility];  Service: Endoscopy;;  . RADIOFREQUENCY ABLATION Right 08/05/2017   Procedure: CT RENAL CRYO AND BIOPSY;  Surgeon: Sandi Mariscal, MD;  Location: [facility] ORS;  Service: Anesthesiology;  Laterality: Right;  . RADIOFREQUENCY ABLATION Right 11/15/2018   Procedure:  RIGHT RENAL CRYOABLATION;  Surgeon: Sandi Mariscal, MD;  Location: [facility] OR[facility];  Service: Anesthesiology;  Laterality: Right;  . THYROIDECTOMY N/A 09/20/2014   Procedure: TOTAL THYROIDECTOMY;  Surgeon: Armandina Gemma, MD;  Location: [facility] OR[facility];  Service: General;  Laterality: N/A;  . TONSILLECTOMY      Allergies: Lorabid [loracarbef], Augmentin [amoxicillin-pot clavulanate], Codeine, Gadolinium derivatives, Sulfa drugs cross reactors, Benicar [olmesartan medoxomil], and Other  Medications: Prior to Admission medications   Medication Sig Start Date End Date Taking? Authorizing Provider  acetaminophen (TYLENOL) 500 MG tablet  Take 2 tablets (1,000 mg total) by mouth every 6 (six) hours as needed (body aches). Patient taking differently: Take 1,000 mg by mouth every 4 (four) hours as needed for moderate pain or headache (body aches).  06/29/17   Geradine Girt, DO  albuterol (PROVENTIL HFA;VENTOLIN HFA) 108 (90 Base) MCG/ACT inhaler Inhale 1-2 puffs into the lungs every 6 (six) hours as needed for wheezing or shortness of breath. 12/28/17   Jean Rosenthal, MD  b complex vitamins tablet Take 1 tablet by mouth daily.    [provider]  calcium carbonate (OSCAL) 1500 (600 Ca) MG TABS tablet Take by mouth 2 (two) times daily with a meal. MAGNESIUM AND ZINC 2 IN AM 2 AT 1200 PM 2 AT DINNERTIME    [provider]  clopidogrel (PLAVIX) 75 MG tablet Take 1 tablet (75 mg total) by mouth daily. 09/15/15   Florencia Reasons, MD  diphenhydrAMINE (BENADRYL) 25 MG tablet Take 50 mg by mouth at bedtime.     [provider]  furosemide (LASIX) 40 MG tablet Take 2 tablets (80 mg total) by mouth 2 (two) times daily. 05/17/16   Geradine Girt, DO  ibuprofen (ADVIL) 800 MG tablet Take 800 mg by mouth every 8 (eight) hours as needed for headache or moderate pain.    [provider]  JANUMET 50-1000 MG tablet Take 1 tablet by mouth 2 (two) times daily. 09/16/17   [provider]  levothyroxine (SYNTHROID, LEVOTHROID) 300 MCG tablet Take 300 mcg by mouth daily before breakfast.    [provider]  lisinopril (PRINIVIL,ZESTRIL) 10 MG tablet Take 10 mg by mouth daily.  09/09/14   [provider]  metoprolol tartrate (LOPRESSOR) 100 MG tablet Take 100 mg by mouth 2 (two) times daily. 06/23/16   [provider]  Multiple Vitamin (MULTIVITAMIN PO) Take 1 tablet by mouth daily.    [provider]  Nutritional Supplements (ESTROVEN MAXIMUM STRENGTH PO) Take 1 tablet by mouth daily.    [provider]  omeprazole (PRILOSEC OTC) 20 MG tablet Take 20 mg by mouth daily.    [provider]  ondansetron (ZOFRAN) 8 MG tablet Take 8 mg by mouth every 8 (eight) hours as needed for nausea or vomiting.    [provider]  potassium chloride SA (K-DUR,KLOR-CON) 20 MEQ tablet Take 40 mEq by mouth 2 (two) times daily.     [provider]  pravastatin (PRAVACHOL) 20 MG tablet Take 20 mg by mouth daily.  06/25/13   [provider]  Probiotic Product (PROBIOTIC PO) Take 1 tablet by mouth daily.    [provider]  zinc gluconate 50 MG tablet Take 50 mg by mouth daily.    [provider]     Family History  Problem Relation Age of Onset  . Heart failure Mother   . Diabetes Mother   . Stroke Father   . Heart failure Father   .  Cancer Father     Social History   Socioeconomic History  . Marital status: Married    Spouse name: Glendell Docker  . Number of children: 2  . Years of education: 45  . Highest education level: Not on file  Occupational History    Comment: NA, disabled  Tobacco Use  . Smoking status: Never Smoker  . Smokeless tobacco: Never Used  Substance and Sexual Activity  . Alcohol use: No  . Drug use: No  . Sexual activity: Not on file  Other Topics Concern  . Not on file  Social History Narrative   Lives with husband and son   Social Determinants of Health   Financial Resource Strain:   . Difficulty of Paying Living Expenses:   Food Insecurity:   . Worried About Charity fundraiser in the Last Year:   . Arboriculturist in the Last Year:   Transportation Needs:   . Film/video editor (Medical):   Marland Kitchen Lack of Transportation (Non-Medical):   Physical Activity:   . Days of Exercise per Week:   . Minutes of Exercise per Session:   Stress:   . Feeling of Stress :   Social Connections:   . Frequency of Communication with Friends and Family:   . Frequency of Social Gatherings with Friends and Family:   . Attends Religious Services:   . Active Member of Clubs or Organizations:   . Attends Theatre manager Meetings:   Marland Kitchen Marital Status:     ECOG Status: 1 - Symptomatic but completely ambulatory  Review of Systems  Review of Systems: A 12 point ROS discussed and pertinent positives are indicated in the HPI above.  All other systems are negative.  Physical Exam No direct physical exam was performed (except for noted visual exam findings with Video Visits).   Vital Signs: There were no vitals taken for this visit.  Imaging:  CT abdomen and pelvis - 08/11/2019; 02/13/2019; 05/17/2017; 09/21/2017 Abdominal MRI - 12/13/2017; 06/13/2017 CT guided right-sided renal lesion cryoablation - 08/05/2017 CT-guided right-sided renal renal lesion cryoablation - 11/15/2018  CT ABDOMEN PELVIS W WO CONTRAST  Result Date: 08/11/2019 CLINICAL DATA:  Right renal mass, status post cryoablation EXAM: CT ABDOMEN AND PELVIS WITHOUT AND WITH CONTRAST TECHNIQUE: Multidetector CT imaging of the abdomen and pelvis was performed following the standard protocol before and following the bolus administration of intravenous contrast. CONTRAST:  156mL OMNIPAQUE IOHEXOL 300 MG/ML  SOLN COMPARISON:  CT abdomen dated 02/13/2019 FINDINGS: Lower chest: 3 x 5 mm ovoid subpleural nodule in the left lower lobe (series 3/image 22), unchanged, likely benign. Hepatobiliary: Moderate hepatic steatosis. Liver is otherwise within normal limits. No suspicious/enhancing hepatic lesions. Status post cholecystectomy. No intrahepatic or extrahepatic ductal dilatation. Pancreas: Within normal limits. Spleen: Within normal limits. Adrenals/Urinary Tract: Adrenal glands are within normal limits. Status post cryoablation in the medial right upper kidney. Cryoablation zone measures 2.0 x 2.1 cm (series 6/image 51). No residual enhancement to suggest viable tumor. Additional scarring in the posterior interpolar right kidney. Additional subcentimeter probable cyst in the posterior right lower pole (series 6/image 69). Subcentimeter cyst in the  lateral left upper pole (series 6/image 46). No enhancing renal lesions. No hydronephrosis. Stomach/Bowel: Stomach is notable for a tiny hiatal hernia. No evidence of bowel obstruction. Normal appendix (series 11/image 102). Vascular/Lymphatic: No evidence of abdominal aortic aneurysm. Atherosclerotic calcifications of the abdominal aorta and branch vessels. No suspicious abdominopelvic lymphadenopathy. Reproductive: Status post hysterectomy. No adnexal masses.  Other: No abdominopelvic ascites. Musculoskeletal: Mild degenerative changes at L5-S1. IMPRESSION: Status post right upper pole renal cryoablation, without evidence recurrent or metastatic disease. Additional stable ancillary findings as above. Electronically Signed   By: Julian Hy M.D.   On: 08/11/2019 09:56    Labs:  CBC: Recent Labs    11/10/18 0906 11/15/18 0720 11/16/18 0408  WBC 5.8 6.2 8.8  HGB 12.5 12.7 11.3*  HCT 39.9 39.6 36.1  PLT 228 230 244    COAGS: Recent Labs    11/15/18 0720  INR 0.9    BMP: Recent Labs    11/10/18 0906 11/10/18 0906 11/15/18 0720 11/15/18 0720 11/16/18 0408 12/11/18 0751 02/13/19 1003 08/10/19 1116  NA 138  --  138  --  137 140  --   --   K 4.3  --  3.9  --  3.7 3.8  --   --   CL 100  --  102  --  103 100  --   --   CO2 27  --  20*  --  21* 25  --   --   GLUCOSE 262*  --  287*  --  216* 233*  --   --   BUN 21*  --  21*  --  22* 17  --   --   CALCIUM 8.6*  --  9.0  --  8.2* 8.3*  --   --   CREATININE 1.29*   < > 1.13*   < > 1.22* 1.15* 1.00 1.20*  GFRNONAA 46*  --  54*  --  49* 53*  --   --   GFRAA 54*  --  >60  --  57* 62  --   --    < > = values in this interval not displayed.    LIVER FUNCTION TESTS: No results for input(s): BILITOT, AST, ALT, ALKPHOS, PROT, ALBUMIN in the last 8760 hours.  TUMOR MARKERS: No results for input(s): AFPTM, CEA, CA199, CHROMGRNA in the last 8760 hours.  Assessment and Plan:  [name] is a 59 y.o. female is a 59 year old  female with past medical history significant for diabetes, hypertension, hyperlipidemia and thyroid cancer who initially underwent image guided right-sided renal cryoablation on 08/05/2017 of 2 right-sided renal lesions (superior pole and posterior interpolar lesions) who was found to have residual disease involving the superior pole lesion for which she underwent a repeat renal cryoablation on 11/15/2018.    Following examinations were reviewed:  CT abdomen and pelvis - 08/11/2019; 02/13/2019; 05/17/2017; 09/21/2017 Abdominal MRI - 12/13/2017; 06/13/2017 CT guided right-sided renal lesion cryoablation - 08/05/2017 CT-guided right-sided renal renal lesion cryoablation - 11/15/2018  Personal review of surveillance abdominal CT performed 08/10/2019 demonstrates a technically excellent result without evidence of residual nodular enhancing tissue within the cryoablation site to suggest residual or recurrent disease.    Q 3 months surveillance imaging will be performed for the first year following the cryoablation, followed by biannual surveillance for the following year and annual examination to document 3 years of stability.  Most recent imaging documents 9 months of stability.  As such, the patient will be seen in consultation following acquisition of next surveillance abdominal CT in 3 months (mid August 2021).   Again, as the patient has questionable history of allergy to the MRI contrast agent, surveillance imaging with be performing with renal protocol CT scan.  In regards to her intermittent right-sided flank pain, I explained that is very unlikely that at this point the  symptoms are associated with her cryoablation and this potentially could be musculoskeletal in etiology.  Given her complaint of hematuria and intermittent bladder spasms, the patient was encouraged to follow-up with her referring urologist, Dr. Gloriann Loan.  The patient knows to call the interventional radiology clinic with any interval  questions or concerns.  PLAN: - Telemedicine consultation in 3 months following acquisition of next surveillance abdominal CT (mid [place] 2021)  A copy of this report was sent to the requesting provider on this date.  Electronically Signed: Sandi Mariscal 08/16/2019, 9:04 AM   I spent a total of 15 Minutes in remote  clinical consultation, greater than 50% of which was counseling/coordinating care for post repeat right-sided renal cryoablation.    Visit type: Audio only (telephone). Audio (no video) only due to patient's lack of internet/smartphone capability. Alternative for in-person consultation at [facility], [address] [place], [place], [place]. This visit type was conducted due to national recommendations for restrictions regarding the COVID-19 Pandemic (e.g. social distancing).  This format is felt to be most appropriate for this patient at this time.  All issues noted in this document were discussed and addressed.

## 2019-10-11 ENCOUNTER — Other Ambulatory Visit: Payer: Self-pay | Admitting: Interventional Radiology

## 2019-10-11 DIAGNOSIS — N2889 Other specified disorders of kidney and ureter: Secondary | ICD-10-CM

## 2019-10-12 ENCOUNTER — Other Ambulatory Visit: Payer: Self-pay

## 2019-10-12 DIAGNOSIS — N2889 Other specified disorders of kidney and ureter: Secondary | ICD-10-CM

## 2019-11-16 ENCOUNTER — Other Ambulatory Visit: Payer: Self-pay

## 2019-11-16 ENCOUNTER — Ambulatory Visit (HOSPITAL_COMMUNITY)
Admission: RE | Admit: 2019-11-16 | Discharge: 2019-11-16 | Disposition: A | Discharge status: Home / Self Care | Payer: Medicare Other | Source: Ambulatory Visit | Attending: Interventional Radiology | Admitting: Interventional Radiology

## 2019-11-16 DIAGNOSIS — N2889 Other specified disorders of kidney and ureter: Secondary | ICD-10-CM | POA: Diagnosis not present

## 2019-11-16 LAB — POCT I-STAT CREATININE: Creatinine, Ser: 1.2 mg/dL — ABNORMAL HIGH (ref 0.44–1.00)

## 2019-11-16 MED ORDER — IOHEXOL 300 MG/ML  SOLN
100.0000 mL | Freq: Once | INTRAMUSCULAR | Status: AC | PRN
  Administered 2019-11-16: 100 mL via INTRAVENOUS

## 2019-11-16 MED ORDER — SODIUM CHLORIDE (PF) 0.9 % IJ SOLN
INTRAMUSCULAR | Status: AC
  Filled 2019-11-16: qty 50

## 2019-11-22 ENCOUNTER — Ambulatory Visit
Admission: RE | Admit: 2019-11-22 | Discharge: 2019-11-22 | Disposition: A | Discharge status: Home / Self Care | Payer: Medicare Other | Source: Ambulatory Visit | Attending: Interventional Radiology | Admitting: Interventional Radiology

## 2019-11-22 ENCOUNTER — Other Ambulatory Visit: Payer: Self-pay

## 2019-11-22 DIAGNOSIS — N2889 Other specified disorders of kidney and ureter: Secondary | ICD-10-CM

## 2019-11-22 HISTORY — PX: IR RADIOLOGIST EVAL & MGMT: IMG5224

## 2019-11-22 NOTE — Progress Notes (Signed)
Patient ID: [name], female   DOB: [birth year], 59 y.o.   MRN: [id number]         Chief Complaint: Post repeat right-sided cryoablation (11/15/2018).  Referring Physician(s): Bell  History of Present Illness:  [name] a 59 y.o.femaleis a 59 year old female with past medical history significant for diabetes, hypertension, hyperlipidemia and thyroid cancer who initially underwent image guided right-sided renal cryoablation on 08/05/2017 of 2 right-sided renal lesions (superior pole and posterior interpolar lesions) who was found to have residual disease involving the superior pole lesion for whichshe underwentarepeat renal cryoablation on 11/15/2018.   She is seen in follow-up consultation via telemedicine following the acquisition of postprocedural CT scan performed  11/16/2019.  She is currently without complaint.   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 "   Asthma    due to allergies    Cancer ([place])    THRYOID 2016 [place], KIDNEY CANCER MAY 2018  RIGHT RENAL CRYOABLATION   Complication of anesthesia    Diabetes mellitus    TYPE 2   Dyspnea    increased exertion    Dysrhythmia    " irregular heart rate per patient"   Family history of adverse reaction to anesthesia    difficult for father to wake after surgery, AFTHER LOW BP WITH ANESTHESIA   Heart murmur    HOH (hard of hearing)    Hypercholesteremia    Hypertension    PONV (postoperative nausea and vomiting)    Renal cancer, right ([place]) dx'd 07/2018, 10/2018   ablation x 2    Thyroid disease    TIA (transient ischemic attack)    LAST TIA  APRIL 2020 ON [facility], reports residual effects    Past Surgical History:  Procedure Laterality Date   ABDOMINAL HYSTERECTOMY     COMPLETE   BIOPSY  03/02/2019   Procedure: BIOPSY;  Surgeon: Carol Ada, MD;  Location: [facility];  Service: Endoscopy;;   CHOLECYSTECTOMY      COLON SURGERY     COLONOSCOPY WITH PROPOFOL N/A 03/02/2019   Procedure: COLONOSCOPY WITH PROPOFOL;  Surgeon: Carol Ada, MD;  Location: [facility];  Service: Endoscopy;  Laterality: N/A;   ear surgeries      IR RADIOLOGIST EVAL & MGMT  01/12/2017   IR RADIOLOGIST EVAL & MGMT  06/16/2017   IR RADIOLOGIST EVAL & MGMT  08/31/2017   IR RADIOLOGIST EVAL & MGMT  12/13/2017   IR RADIOLOGIST EVAL & MGMT  03/16/2018   IR RADIOLOGIST EVAL & MGMT  11/01/2018   IR RADIOLOGIST EVAL & MGMT  12/13/2018   IR RADIOLOGIST EVAL & MGMT  02/15/2019   IR RADIOLOGIST EVAL & MGMT  08/16/2019   KNEE ARTHROSCOPY     LEFT HEART CATHETERIZATION WITH CORONARY ANGIOGRAM N/A 04/24/2014   Procedure: LEFT HEART CATHETERIZATION WITH CORONARY ANGIOGRAM;  Surgeon: Burnell Blanks, MD;  Location: [facility] C[facility] LAB;  Service: Cardiovascular;  Laterality: N/A;   POLYPECTOMY  03/02/2019   Procedure: POLYPECTOMY;  Surgeon: Carol Ada, MD;  Location: [facility];  Service: Endoscopy;;   RADIOFREQUENCY ABLATION Right 08/05/2017   Procedure: CT RENAL CRYO AND BIOPSY;  Surgeon: Sandi Mariscal, MD;  Location: [facility];  Service: Anesthesiology;  Laterality: Right;   RADIOFREQUENCY ABLATION Right 11/15/2018   Procedure: RIGHT RENAL CRYOABLATION;  Surgeon: Sandi Mariscal, MD;  Location: [facility];  Service: Anesthesiology;  Laterality: Right;  THYROIDECTOMY N/A 09/20/2014   Procedure: TOTAL THYROIDECTOMY;  Surgeon: Armandina Gemma, MD;  Location: [facility];  Service: General;  Laterality: N/A;   TONSILLECTOMY      Allergies: Lorabid [loracarbef], Augmentin [amoxicillin-pot clavulanate], Codeine, Gadolinium derivatives, Sulfa drugs cross reactors, Benicar [olmesartan medoxomil], and Other  Medications: Prior to Admission medications   Medication Sig Start Date End Date Taking? Authorizing Provider  acetaminophen (TYLENOL) 500 MG tablet Take 2 tablets (1,000 mg total) by mouth every 6 (six) hours as needed (body aches). Patient taking  differently: Take 1,000 mg by mouth every 4 (four) hours as needed for moderate pain or headache (body aches).  06/29/17   Geradine Girt, DO  albuterol (PROVENTIL HFA;VENTOLIN HFA) 108 (90 Base) MCG/ACT inhaler Inhale 1-2 puffs into the lungs every 6 (six) hours as needed for wheezing or shortness of breath. 12/28/17   Jean Rosenthal, MD  b complex vitamins tablet Take 1 tablet by mouth daily.    [provider]  calcium carbonate (OSCAL) 1500 (600 Ca) MG TABS tablet Take by mouth 2 (two) times daily with a meal. MAGNESIUM AND ZINC 2 IN AM 2 AT 1200 PM 2 AT DINNERTIME    [provider]  clopidogrel (PLAVIX) 75 MG tablet Take 1 tablet (75 mg total) by mouth daily. 09/15/15   Florencia Reasons, MD  diphenhydrAMINE (BENADRYL) 25 MG tablet Take 50 mg by mouth at bedtime.     [provider]  furosemide (LASIX) 40 MG tablet Take 2 tablets (80 mg total) by mouth 2 (two) times daily. 05/17/16   Geradine Girt, DO  ibuprofen (ADVIL) 800 MG tablet Take 800 mg by mouth every 8 (eight) hours as needed for headache or moderate pain.    [provider]  JANUMET 50-1000 MG tablet Take 1 tablet by mouth 2 (two) times daily. 09/16/17   [provider]  levothyroxine (SYNTHROID, LEVOTHROID) 300 MCG tablet Take 300 mcg by mouth daily before breakfast.    [provider]  lisinopril (PRINIVIL,ZESTRIL) 10 MG tablet Take 10 mg by mouth daily.  09/09/14   [provider]  metoprolol tartrate (LOPRESSOR) 100 MG tablet Take 100 mg by mouth 2 (two) times daily. 06/23/16   [provider]  Multiple Vitamin (MULTIVITAMIN PO) Take 1 tablet by mouth daily.    [provider]  Nutritional Supplements (ESTROVEN MAXIMUM STRENGTH PO) Take 1 tablet by mouth daily.    [provider]  omeprazole (PRILOSEC OTC) 20 MG tablet Take 20 mg by mouth daily.    [provider]  ondansetron (ZOFRAN) 8 MG tablet Take 8 mg by mouth every 8 (eight) hours as needed  for nausea or vomiting.    [provider]  potassium chloride SA (K-DUR,KLOR-CON) 20 MEQ tablet Take 40 mEq by mouth 2 (two) times daily.     [provider]  pravastatin (PRAVACHOL) 20 MG tablet Take 20 mg by mouth daily.  06/25/13   [provider]  Probiotic Product (PROBIOTIC PO) Take 1 tablet by mouth daily.    [provider]  zinc gluconate 50 MG tablet Take 50 mg by mouth daily.    [provider]     Family History  Problem Relation Age of Onset   Heart failure Mother    Diabetes Mother    Stroke Father    Heart failure Father    Cancer Father     Social History   Socioeconomic History   Marital status: Married  Spouse name: Glendell Docker   Number of children: 2   Years of education: 14   Highest education level: Not on file  Occupational History    Comment: NA, disabled  Tobacco Use   Smoking status: Never Smoker   Smokeless tobacco: Never Used  Vaping Use   Vaping Use: Never used  Substance and Sexual Activity   Alcohol use: No   Drug use: No   Sexual activity: Not on file  Other Topics Concern   Not on file  Social History Narrative   Lives with husband and son   Social Determinants of Health   Financial Resource Strain:    Difficulty of Paying Living Expenses: Not on file  Food Insecurity:    Worried About Charity fundraiser in the Last Year: Not on file   [employer] of Food in the Last Year: Not on file  Transportation Needs:    Lack of Transportation (Medical): Not on file   Lack of Transportation (Non-Medical): Not on file  Physical Activity:    Days of Exercise per Week: Not on file   Minutes of Exercise per Session: Not on file  Stress:    Feeling of Stress : Not on file  Social Connections:    Frequency of Communication with Friends and Family: Not on file   Frequency of Social Gatherings with Friends and Family: Not on file   Attends Religious Services: Not on file   Active  Member of Clubs or Organizations: Not on file   Attends Archivist Meetings: Not on file   Marital Status: Not on file    ECOG Status: 1 - Symptomatic but completely ambulatory  Review of Systems  Review of Systems: A 12 point ROS discussed and pertinent positives are indicated in the HPI above.  All other systems are negative.  Physical Exam No direct physical exam was performed (except for noted visual exam findings with Video Visits).   Vital Signs: There were no vitals taken for this visit.  Imaging:  CT abdomen and pelvis-11/16/2019; 08/11/2019; 02/13/2020; CT-guided cryoablation-11/15/2018.  Personal review of surveillance CT scan of the abdomen pelvis performed 11/16/2019 demonstrates a technically excellent result with continued reduction in size of cryoablation defect involving the posterior medial aspect of the right kidney, currently measuring 1.7 x 1.5 cm, previously, 2.1 x 2.0 cm without evidence of nodular enhancing tissue to suggest residual or recurrent disease.  No evidence of metastatic disease within the abdomen.  CT ABDOMEN W WO CONTRAST  Result Date: 11/16/2019 CLINICAL DATA:  Status post right renal ablation in 2019 and 2020. Hysterectomy. Cholecystectomy. Thyroid cancer diagnosed 5 years ago. Mid to right-sided abdominal pain. Occasional nausea. EXAM: CT ABDOMEN WITHOUT AND WITH CONTRAST TECHNIQUE: Multidetector CT imaging of the abdomen was performed following the standard protocol before and following the bolus administration of intravenous contrast. CONTRAST:  162mL OMNIPAQUE IOHEXOL 300 MG/ML  SOLN COMPARISON:  08/10/2019 FINDINGS: Lower chest: 3 mm left lower lobe pulmonary nodule is similar back to the 03/15/2018 and can be presumed benign. Normal heart size without pericardial or pleural effusion. Right coronary artery atherosclerosis. Hepatobiliary: Moderate hepatic steatosis with marked hepatomegaly at 21.4 cm craniocaudal. Cholecystectomy, without  biliary ductal dilatation. Pancreas: Fatty replacement involving the pancreatic head and uncinate process. No duct dilatation or acute inflammation. Spleen: Normal in size, without focal abnormality. Adrenals/Urinary Tract: Normal adrenal glands. No renal calculi or hydronephrosis. Upper pole right renal ablation defect measures on the order of 1.7 x 1.5 cm on  58/6. 2.1 x 2.0 cm on the prior. No abnormal enhancement to suggest residual or recurrent disease. Bilateral renal scarring with too small to characterize bilateral renal lesions. No collecting system complication. Stomach/Bowel: Normal stomach, without wall thickening. Colonic stool burden suggests constipation. Normal small bowel. Vascular/Lymphatic: Aortic atherosclerosis. Patent renal veins. No retroperitoneal or retrocrural adenopathy. Other: No ascites. Musculoskeletal: No acute osseous abnormality. IMPRESSION: 1. Mild decrease in size of upper pole right renal ablation defect, without findings of recurrent or metastatic disease. 2. No explanation for patient's abdominal pain or nausea. 3. Hepatic steatosis and hepatomegaly. 4. Age advanced coronary artery atherosclerosis. Recommend assessment of coronary risk factors and consideration of medical therapy. 5.  Aortic Atherosclerosis (ICD10-I70.0). Electronically Signed   By: Abigail Miyamoto M.D.   On: 11/16/2019 12:32    Labs:  CBC: No results for input(s): WBC, HGB, HCT, PLT in the last 8760 hours.  COAGS: No results for input(s): INR, APTT in the last 8760 hours.  BMP: Recent Labs    12/11/18 0751 02/13/19 1003 08/10/19 1116 11/16/19 0854  NA 140  --   --   --   K 3.8  --   --   --   CL 100  --   --   --   CO2 25  --   --   --   GLUCOSE 233*  --   --   --   BUN 17  --   --   --   CALCIUM 8.3*  --   --   --   CREATININE 1.15* 1.00 1.20* 1.20*  GFRNONAA 53*  --   --   --   GFRAA 62  --   --   --     LIVER FUNCTION TESTS: No results for input(s): BILITOT, AST, ALT, ALKPHOS,  PROT, ALBUMIN in the last 8760 hours.  TUMOR MARKERS: No results for input(s): AFPTM, CEA, CA199, CHROMGRNA in the last 8760 hours.  Assessment and Plan:  [name] a 59 y.o.femaleis a 59 year old female with past medical history significant for diabetes, hypertension, hyperlipidemia and thyroid cancer who initially underwent image guided right-sided renal cryoablation on 08/05/2017 of 2 right-sided renal lesions (superior pole and posterior interpolar lesions) who was found to have residual disease involving the superior pole lesion for whichshe underwentarepeat renal cryoablation on 11/15/2018.   She is seen in follow-up consultation via telemedicine following the acquisition of postprocedural CT scan performed  11/16/2019.  Personal review of surveillance CT scan of the abdomen and pelvis performed 11/16/2019 demonstrates a technically excellent result with continued reduction in size of cryoablation defect involving the posterior medial aspect of the right kidney, currently measuring 1.7 x 1.5 cm, previously, 2.1 x 2.0 cm, without evidence of nodular enhancing tissue to suggest residual or recurrent disease.  No evidence of metastatic disease within the abdomen.  This examination documents 1 year of stability since undergoing the final cryoablation performed 11/15/2018.    As such we will transition from q3 months to now q6 months surveillance examinations.  Note, surveillance imaging will be performed with CT given patient's history of MRI contrast allergy.  Patient demonstrated excellent understanding of the above conversation and is in agreement with the proposed plan of care.  Plan: - Telemedicine consultation following acquisition of surveillance renal protocol CT scan in 6 months (February/March 2022).  A copy of this report was sent to the requesting provider on this date.  Electronically Signed: Sandi Mariscal 11/22/2019, 10:11 AM   I spent a  total of 10 Minutes in  remote  clinical consultation, greater than 50% of which was counseling/coordinating care for right sided renal cryoablation.    Visit type: Audio only (telephone). Audio (no video) only due to patient's lack of internet/smartphone capability. Alternative for in-person consultation at [facility], [address], [place], [place]. This visit type was conducted due to national recommendations for restrictions regarding the COVID-19 Pandemic (e.g. social distancing).  This format is felt to be most appropriate for this patient at this time.  All issues noted in this document were discussed and addressed.

## 2020-03-09 ENCOUNTER — Other Ambulatory Visit: Payer: Self-pay

## 2020-03-09 ENCOUNTER — Observation Stay (HOSPITAL_COMMUNITY): Payer: Medicare Other

## 2020-03-09 ENCOUNTER — Emergency Department (HOSPITAL_COMMUNITY): Payer: Medicare Other

## 2020-03-09 ENCOUNTER — Encounter (HOSPITAL_COMMUNITY): Payer: Self-pay | Admitting: *Deleted

## 2020-03-09 ENCOUNTER — Observation Stay (HOSPITAL_COMMUNITY)
Admission: EM | Admit: 2020-03-09 | Discharge: 2020-03-10 | Disposition: A | Discharge status: Home / Self Care | Payer: Medicare Other | Attending: Internal Medicine | Admitting: Internal Medicine

## 2020-03-09 DIAGNOSIS — E119 Type 2 diabetes mellitus without complications: Secondary | ICD-10-CM | POA: Diagnosis not present

## 2020-03-09 DIAGNOSIS — R200 Anesthesia of skin: Secondary | ICD-10-CM | POA: Diagnosis present

## 2020-03-09 DIAGNOSIS — E7800 Pure hypercholesterolemia, unspecified: Secondary | ICD-10-CM

## 2020-03-09 DIAGNOSIS — I10 Essential (primary) hypertension: Secondary | ICD-10-CM

## 2020-03-09 DIAGNOSIS — E039 Hypothyroidism, unspecified: Secondary | ICD-10-CM | POA: Diagnosis present

## 2020-03-09 DIAGNOSIS — Z20822 Contact with and (suspected) exposure to covid-19: Secondary | ICD-10-CM | POA: Insufficient documentation

## 2020-03-09 DIAGNOSIS — E1140 Type 2 diabetes mellitus with diabetic neuropathy, unspecified: Secondary | ICD-10-CM

## 2020-03-09 DIAGNOSIS — Z7984 Long term (current) use of oral hypoglycemic drugs: Secondary | ICD-10-CM | POA: Insufficient documentation

## 2020-03-09 DIAGNOSIS — I119 Hypertensive heart disease without heart failure: Secondary | ICD-10-CM | POA: Insufficient documentation

## 2020-03-09 DIAGNOSIS — K760 Fatty (change of) liver, not elsewhere classified: Secondary | ICD-10-CM | POA: Diagnosis present

## 2020-03-09 DIAGNOSIS — R2990 Unspecified symptoms and signs involving the nervous system: Secondary | ICD-10-CM

## 2020-03-09 DIAGNOSIS — I2510 Atherosclerotic heart disease of native coronary artery without angina pectoris: Secondary | ICD-10-CM | POA: Diagnosis not present

## 2020-03-09 DIAGNOSIS — Z85850 Personal history of malignant neoplasm of thyroid: Secondary | ICD-10-CM | POA: Diagnosis not present

## 2020-03-09 DIAGNOSIS — D440 Neoplasm of uncertain behavior of thyroid gland: Secondary | ICD-10-CM | POA: Diagnosis present

## 2020-03-09 DIAGNOSIS — G459 Transient cerebral ischemic attack, unspecified: Secondary | ICD-10-CM | POA: Diagnosis not present

## 2020-03-09 DIAGNOSIS — J45909 Unspecified asthma, uncomplicated: Secondary | ICD-10-CM | POA: Diagnosis not present

## 2020-03-09 DIAGNOSIS — Z79899 Other long term (current) drug therapy: Secondary | ICD-10-CM | POA: Diagnosis not present

## 2020-03-09 DIAGNOSIS — Z85528 Personal history of other malignant neoplasm of kidney: Secondary | ICD-10-CM | POA: Insufficient documentation

## 2020-03-09 DIAGNOSIS — C641 Malignant neoplasm of right kidney, except renal pelvis: Secondary | ICD-10-CM

## 2020-03-09 DIAGNOSIS — I712 Thoracic aortic aneurysm, without rupture, unspecified: Secondary | ICD-10-CM | POA: Diagnosis present

## 2020-03-09 DIAGNOSIS — R079 Chest pain, unspecified: Secondary | ICD-10-CM | POA: Diagnosis present

## 2020-03-09 LAB — COMPREHENSIVE METABOLIC PANEL
ALT: 22 U/L (ref 0–44)
AST: 21 U/L (ref 15–41)
Albumin: 3.6 g/dL (ref 3.5–5.0)
Alkaline Phosphatase: 57 U/L (ref 38–126)
Anion gap: 13 (ref 5–15)
BUN: 11 mg/dL (ref 6–20)
CO2: 22 mmol/L (ref 22–32)
Calcium: 8.1 mg/dL — ABNORMAL LOW (ref 8.9–10.3)
Chloride: 104 mmol/L (ref 98–111)
Creatinine, Ser: 1.13 mg/dL — ABNORMAL HIGH (ref 0.44–1.00)
GFR, Estimated: 57 mL/min — ABNORMAL LOW (ref >60)
Glucose, Bld: 216 mg/dL — ABNORMAL HIGH (ref 70–99)
Potassium: 3.7 mmol/L (ref 3.5–5.1)
Sodium: 139 mmol/L (ref 135–145)
Total Bilirubin: 0.5 mg/dL (ref 0.3–1.2)
Total Protein: 6.8 g/dL (ref 6.5–8.1)

## 2020-03-09 LAB — TROPONIN I (HIGH SENSITIVITY)
Troponin I (High Sensitivity): 6 ng/L (ref <18)
Troponin I (High Sensitivity): 5 ng/L (ref <18)

## 2020-03-09 LAB — CBC
HCT: 40.6 % (ref 36.0–46.0)
Hemoglobin: 12.7 g/dL (ref 12.0–15.0)
MCH: 29.3 pg (ref 26.0–34.0)
MCHC: 31.3 g/dL (ref 30.0–36.0)
MCV: 93.8 fL (ref 80.0–100.0)
Platelets: 277 10*3/uL (ref 150–400)
RBC: 4.33 MIL/uL (ref 3.87–5.11)
RDW: 13.6 % (ref 11.5–15.5)
WBC: 7 10*3/uL (ref 4.0–10.5)
nRBC: 0 % (ref 0.0–0.2)

## 2020-03-09 LAB — DIFFERENTIAL
Abs Immature Granulocytes: 0.03 10*3/uL (ref 0.00–0.07)
Basophils Absolute: 0 10*3/uL (ref 0.0–0.1)
Basophils Relative: 1 %
Eosinophils Absolute: 0.4 10*3/uL (ref 0.0–0.5)
Eosinophils Relative: 5 %
Immature Granulocytes: 0 %
Lymphocytes Relative: 33 %
Lymphs Abs: 2.3 10*3/uL (ref 0.7–4.0)
Monocytes Absolute: 0.5 10*3/uL (ref 0.1–1.0)
Monocytes Relative: 6 %
Neutro Abs: 3.8 10*3/uL (ref 1.7–7.7)
Neutrophils Relative %: 55 %

## 2020-03-09 LAB — I-STAT CHEM 8, ED
BUN: 12 mg/dL (ref 6–20)
Calcium, Ion: 1.07 mmol/L — ABNORMAL LOW (ref 1.15–1.40)
Chloride: 105 mmol/L (ref 98–111)
Creatinine, Ser: 1.1 mg/dL — ABNORMAL HIGH (ref 0.44–1.00)
Glucose, Bld: 211 mg/dL — ABNORMAL HIGH (ref 70–99)
HCT: 38 % (ref 36.0–46.0)
Hemoglobin: 12.9 g/dL (ref 12.0–15.0)
Potassium: 3.6 mmol/L (ref 3.5–5.1)
Sodium: 142 mmol/L (ref 135–145)
TCO2: 23 mmol/L (ref 22–32)

## 2020-03-09 LAB — RAPID URINE DRUG SCREEN, HOSP PERFORMED
Amphetamines: NOT DETECTED
Barbiturates: NOT DETECTED
Benzodiazepines: NOT DETECTED
Cocaine: NOT DETECTED
Opiates: NOT DETECTED
Tetrahydrocannabinol: NOT DETECTED

## 2020-03-09 LAB — URINALYSIS, ROUTINE W REFLEX MICROSCOPIC
Bilirubin Urine: NEGATIVE
Glucose, UA: NEGATIVE mg/dL
Hgb urine dipstick: NEGATIVE
Ketones, ur: NEGATIVE mg/dL
Leukocytes,Ua: NEGATIVE
Nitrite: NEGATIVE
Protein, ur: NEGATIVE mg/dL
Specific Gravity, Urine: 1.009 (ref 1.005–1.030)
pH: 5 (ref 5.0–8.0)

## 2020-03-09 LAB — ETHANOL: Alcohol, Ethyl (B): <10 mg/dL (ref <10)

## 2020-03-09 LAB — GLUCOSE, CAPILLARY
Glucose-Capillary: 136 mg/dL — ABNORMAL HIGH (ref 70–99)
Glucose-Capillary: 224 mg/dL — ABNORMAL HIGH (ref 70–99)

## 2020-03-09 LAB — HIV ANTIBODY (ROUTINE TESTING W REFLEX): HIV Screen 4th Generation wRfx: NONREACTIVE

## 2020-03-09 LAB — PROTIME-INR
INR: 0.9 (ref 0.8–1.2)
Prothrombin Time: 12 seconds (ref 11.4–15.2)

## 2020-03-09 LAB — RESP PANEL BY RT-PCR (FLU A&B, COVID) ARPGX2
Influenza A by PCR: NEGATIVE
Influenza B by PCR: NEGATIVE
SARS Coronavirus 2 by RT PCR: NEGATIVE

## 2020-03-09 LAB — HEMOGLOBIN A1C
Hgb A1c MFr Bld: 8.3 % — ABNORMAL HIGH (ref 4.8–5.6)
Mean Plasma Glucose: 191.51 mg/dL

## 2020-03-09 LAB — APTT: aPTT: 29 seconds (ref 24–36)

## 2020-03-09 LAB — CBG MONITORING, ED: Glucose-Capillary: 205 mg/dL — ABNORMAL HIGH (ref 70–99)

## 2020-03-09 MED ORDER — LISINOPRIL 10 MG PO TABS
10.0000 mg | ORAL_TABLET | Freq: Every day | ORAL | Status: DC
  Administered 2020-03-09 – 2020-03-10 (×2): 10 mg via ORAL
  Filled 2020-03-09 (×2): qty 1

## 2020-03-09 MED ORDER — OMEPRAZOLE MAGNESIUM 20 MG PO TBEC: 20.0000 mg | DELAYED_RELEASE_TABLET | Freq: Every day | ORAL | Status: DC

## 2020-03-09 MED ORDER — PANTOPRAZOLE SODIUM 40 MG PO TBEC
40.0000 mg | DELAYED_RELEASE_TABLET | Freq: Every day | ORAL | Status: DC
  Administered 2020-03-09: 22:00:00 40 mg via ORAL
  Filled 2020-03-09: qty 1

## 2020-03-09 MED ORDER — IOHEXOL 350 MG/ML SOLN
75.0000 mL | Freq: Once | INTRAVENOUS | Status: AC | PRN
  Administered 2020-03-09: 75 mL via INTRAVENOUS

## 2020-03-09 MED ORDER — PRAVASTATIN SODIUM 10 MG PO TABS
20.0000 mg | ORAL_TABLET | Freq: Every day | ORAL | Status: DC
  Administered 2020-03-09: 17:00:00 20 mg via ORAL
  Filled 2020-03-09: qty 2

## 2020-03-09 MED ORDER — FUROSEMIDE 80 MG PO TABS
80.0000 mg | ORAL_TABLET | Freq: Two times a day (BID) | ORAL | Status: DC
  Administered 2020-03-09 – 2020-03-10 (×2): 80 mg via ORAL
  Filled 2020-03-09 (×2): qty 1

## 2020-03-09 MED ORDER — INSULIN ASPART 100 UNIT/ML ~~LOC~~ SOLN
0.0000 [IU] | Freq: Three times a day (TID) | SUBCUTANEOUS | Status: DC
  Administered 2020-03-09: 17:00:00 2 [IU] via SUBCUTANEOUS
  Administered 2020-03-10: 06:00:00 3 [IU] via SUBCUTANEOUS
  Administered 2020-03-10: 11:00:00 5 [IU] via SUBCUTANEOUS

## 2020-03-09 MED ORDER — ENOXAPARIN SODIUM 40 MG/0.4ML ~~LOC~~ SOLN
40.0000 mg | SUBCUTANEOUS | Status: DC
  Administered 2020-03-09: 17:00:00 40 mg via SUBCUTANEOUS
  Filled 2020-03-09 (×2): qty 0.4

## 2020-03-09 MED ORDER — METOPROLOL TARTRATE 50 MG PO TABS
100.0000 mg | ORAL_TABLET | Freq: Two times a day (BID) | ORAL | Status: DC
  Administered 2020-03-09 – 2020-03-10 (×2): 100 mg via ORAL
  Filled 2020-03-09 (×3): qty 2

## 2020-03-09 MED ORDER — ACETAMINOPHEN 160 MG/5ML PO SOLN: 650.0000 mg | ORAL | Status: DC | PRN

## 2020-03-09 MED ORDER — ACETAMINOPHEN 325 MG PO TABS
650.0000 mg | ORAL_TABLET | ORAL | Status: DC | PRN
Start: 2020-03-09 — End: 2020-03-10
  Administered 2020-03-09: 650 mg via ORAL
  Filled 2020-03-09: qty 2

## 2020-03-09 MED ORDER — ALBUTEROL SULFATE HFA 108 (90 BASE) MCG/ACT IN AERS
1.0000 | INHALATION_SPRAY | Freq: Four times a day (QID) | RESPIRATORY_TRACT | Status: DC | PRN
  Filled 2020-03-09: qty 6.7

## 2020-03-09 MED ORDER — POTASSIUM CHLORIDE CRYS ER 20 MEQ PO TBCR
40.0000 meq | EXTENDED_RELEASE_TABLET | Freq: Two times a day (BID) | ORAL | Status: DC
  Administered 2020-03-09 – 2020-03-10 (×2): 40 meq via ORAL
  Filled 2020-03-09 (×2): qty 2

## 2020-03-09 MED ORDER — DIPHENHYDRAMINE HCL 25 MG PO CAPS
50.0000 mg | ORAL_CAPSULE | Freq: Every day | ORAL | Status: DC
  Administered 2020-03-09: 22:00:00 50 mg via ORAL
  Filled 2020-03-09: qty 2

## 2020-03-09 MED ORDER — CLOPIDOGREL BISULFATE 75 MG PO TABS
75.0000 mg | ORAL_TABLET | Freq: Every day | ORAL | Status: DC
  Administered 2020-03-09 – 2020-03-10 (×2): 75 mg via ORAL
  Filled 2020-03-09 (×3): qty 1

## 2020-03-09 MED ORDER — ACETAMINOPHEN 650 MG RE SUPP: 650.0000 mg | RECTAL | Status: DC | PRN

## 2020-03-09 MED ORDER — STROKE: EARLY STAGES OF RECOVERY BOOK
Freq: Once | Status: AC
  Filled 2020-03-09 (×2): qty 1

## 2020-03-09 MED ORDER — FLUTICASONE PROPIONATE 50 MCG/ACT NA SUSP: 2.0000 | Freq: Every day | NASAL | Status: DC | PRN

## 2020-03-09 MED ORDER — LEVOTHYROXINE SODIUM 100 MCG PO TABS
300.0000 ug | ORAL_TABLET | Freq: Every day | ORAL | Status: DC
  Administered 2020-03-10: 06:00:00 300 ug via ORAL
  Filled 2020-03-09: qty 3

## 2020-03-09 NOTE — ED Triage Notes (Signed)
The pt is c/o lt facial numbness and jerking of her lt face at approx 415 today  She got up to use the br,  She was last normal at 2200 she fee; a little weaker in her lt arm and leg also hx of tias

## 2020-03-09 NOTE — ED Provider Notes (Addendum)
MSE was initiated and I personally evaluated the patient and placed orders (if any) at  5:48 AM on March 09, 2020.  The patient appears stable so that the remainder of the MSE may be completed by another provider.   Patient is a 59 year old female with history of TIAs on clopidogrel who presents to the emergency department with left facial numbness and arm numbness that started when she woke up and went to the bathroom at 4:15 AM.  Last seen normal when she went to bed at 10 PM last night.  States that her left arm felt heavy and weak.  She noticed "tremors" in the left side of her face.  No other numbness or weakness.  Symptoms still present.  Having some chest tightness as well.  On my exam, patient reports numbness just over the left cheek but normal sensation in the left forehead.  She reports her entire left arm feels numb compared to the right.  Normal sensation in both legs.  She has 5/5 strength in all 4 extremities.  Cranial nerves II through XII intact.  Normal speech.  NIH stroke scale would be 1.  Outside of TPA window.  Not LVO positive.  Will obtain stroke work-up and cardiac labs.  She is hemodynamically stable and can be seen by oncoming team when ED bed is available.       Shaye Elling, Delice Bison, DO 03/09/20 [phone]

## 2020-03-09 NOTE — ED Provider Notes (Signed)
[facility] EMERGENCY DEPARTMENT Provider Note   CSN: [id number] Arrival date & time: 03/09/20  0931     History Chief Complaint  Patient presents with  . facial  numbness    [name] is a 59 y.o. female.  Presents to ER with concern for facial numbness, left leg numbness.  Patient reports she had a normal day yesterday, went to bed at 10:00.  Husband reports she went to bed at midnight.  Around 06/30/2013, she woke up to go to the bathroom when she noticed left facial numbness and left arm numbness.  States that she also had a slight tremor in the left side of her face.  The symptoms resolved after she came to the hospital.  She estimates that lasted approximately 1 to 1.5 hours.  Currently denies any numbness.  Does feel slightly weak in her legs.  At time of initial episode she also had some chest tightness.  Describes it as mild, not associated with exertion, occurring at rest.  No pain or discomfort at present.  Nonradiating.  Per chart review, admission for TIA versus CVA versus complex migraine in February 2020.  MRI negative.  Initially on DAPT and then transition to Plavix.  Patient reports she is still taking Plavix, last dose yesterday.  HPI     Past Medical History:  Diagnosis Date  . Anemia    hx of   . 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 Asthma    due to allergies   . Cancer ([place])    THRYOID 2016 [place] WITH SURGERY, KIDNEY CANCER MAY 2018  RIGHT RENAL CRYOABLATION  . Complication of anesthesia   . Diabetes mellitus    TYPE 2  . Dyspnea    increased exertion   . Dysrhythmia    " irregular heart rate per patient"  . Family history of adverse reaction to anesthesia    difficult for father to wake after surgery, AFTHER LOW BP WITH ANESTHESIA  . Heart murmur   . HOH (hard of hearing)   . Hypercholesteremia   . Hypertension   . PONV (postoperative nausea and vomiting)   . Renal cancer, right ([place]) dx'd 07/2018, 10/2018   ablation x 2   . Thyroid  disease   . TIA (transient ischemic attack)    LAST TIA  APRIL 2020 ON PLAVIX, reports residual effects    Patient Active Problem List   Diagnosis Date Noted  . 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[place]) 11/15/2018  . 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 UTI (urinary tract infection) 06/26/2017  . Urinary tract infection 01/24/2017  . Abdominal pain 01/21/2017  . Stroke-like symptoms 05/16/2016  . 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 Fatty liver disease, nonalcoholic 03/13/2445  . Slurred speech 09/13/2015  . Parotitis 09/13/2015  . Gastroenteritis 08/21/2015  . Hypothyroid 08/21/2015  . 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 Pain in the chest   . Essential hypertension   . Thoracic aortic aneurysm ([place])   . Chest pain 04/23/2014  . Anginal pain ([place]) 04/23/2014  . Type 2 diabetes mellitus ([place])   . Hypertension   . Hypercholesteremia     Past Surgical History:  Procedure Laterality Date  . ABDOMINAL HYSTERECTOMY     COMPLETE  . BIOPSY  03/02/2019   Procedure: BIOPSY;  Surgeon: Carol Ada, MD;  Location: [facility];  Service: Endoscopy;;  . CHOLECYSTECTOMY    .  COLON SURGERY    . COLONOSCOPY WITH PROPOFOL N/A 03/02/2019   Procedure: COLONOSCOPY WITH PROPOFOL;  Surgeon: Carol Ada, MD;  Location: [facility];  Service: Endoscopy;  Laterality: N/A;  . ear surgeries     . IR RADIOLOGIST EVAL & MGMT  01/12/2017  . IR RADIOLOGIST EVAL & MGMT  06/16/2017  . IR RADIOLOGIST EVAL & MGMT  08/31/2017  . IR RADIOLOGIST EVAL & MGMT  12/13/2017  . IR RADIOLOGIST EVAL & MGMT  03/16/2018  . IR RADIOLOGIST EVAL & MGMT  11/01/2018  . IR RADIOLOGIST EVAL & MGMT  12/13/2018  . IR RADIOLOGIST EVAL & MGMT  02/15/2019  . IR RADIOLOGIST EVAL & MGMT  08/16/2019  . IR RADIOLOGIST EVAL & MGMT  11/22/2019  . KNEE ARTHROSCOPY    . LEFT HEART CATHETERIZATION WITH CORONARY ANGIOGRAM N/A 04/24/2014    Procedure: LEFT HEART CATHETERIZATION WITH CORONARY ANGIOGRAM;  Surgeon: Burnell Blanks, MD;  Location: [facility] CATH LAB;  Service: Cardiovascular;  Laterality: N/A;  . POLYPECTOMY  03/02/2019   Procedure: POLYPECTOMY;  Surgeon: Carol Ada, MD;  Location: [facility]OS[facility];  Service: Endoscopy;;  . RADIOFREQUENCY ABLATION Right 08/05/2017   Procedure: CT RENAL CRYO AND BIOPSY;  Surgeon: Sandi Mariscal, MD;  Location: [facility] ORS;  Service: Anesthesiology;  Laterality: Right;  . RADIOFREQUENCY ABLATION Right 11/15/2018   Procedure: RIGHT RENAL CRYOABLATION;  Surgeon: Sandi Mariscal, MD;  Location: [facility];  Service: Anesthesiology;  Laterality: Right;  . THYROIDECTOMY N/A 09/20/2014   Procedure: TOTAL THYROIDECTOMY;  Surgeon: Armandina Gemma, MD;  Location: [facility];  Service: General;  Laterality: N/A;  . TONSILLECTOMY       OB History   No obstetric history on file.     Family History  Problem Relation Age of Onset  . Heart failure Mother   . Diabetes Mother   . Stroke Father   . Heart failure Father   . Cancer Father     Social History   Tobacco Use  . Smoking status: Never Smoker  . Smokeless tobacco: Never Used  Vaping Use  . Vaping Use: Never used  Substance Use Topics  . Alcohol use: No  . Drug use: No    Home Medications Prior to Admission medications   Medication Sig Start Date End Date Taking? Authorizing Provider  acetaminophen (TYLENOL) 500 MG tablet Take 2 tablets (1,000 mg total) by mouth every 6 (six) hours as needed (body aches). Patient taking differently: Take 1,000 mg by mouth every 4 (four) hours as needed for moderate pain or headache (body aches).  06/29/17   Geradine Girt, DO  albuterol (PROVENTIL HFA;VENTOLIN HFA) 108 (90 Base) MCG/ACT inhaler Inhale 1-2 puffs into the lungs every 6 (six) hours as needed for wheezing or shortness of breath. 12/28/17   Jean Rosenthal, MD  b complex vitamins tablet Take 1 tablet by mouth daily.    [provider]  calcium carbonate  (OSCAL) 1500 (600 Ca) MG TABS tablet Take by mouth 2 (two) times daily with a meal. MAGNESIUM AND ZINC 2 IN AM 2 AT 1200 PM 2 AT DINNERTIME    [provider]  clopidogrel (PLAVIX) 75 MG tablet Take 1 tablet (75 mg total) by mouth daily. 09/15/15   Florencia Reasons, MD  diphenhydrAMINE (BENADRYL) 25 MG tablet Take 50 mg by mouth at bedtime.     [provider]  furosemide (LASIX) 40 MG tablet Take 2 tablets (80 mg total) by mouth 2 (two) times daily. 05/17/16  Eulogio Bear U, DO  ibuprofen (ADVIL) 800 MG tablet Take 800 mg by mouth every 8 (eight) hours as needed for headache or moderate pain.    [provider]  JANUMET 50-1000 MG tablet Take 1 tablet by mouth 2 (two) times daily. 09/16/17   [provider]  levothyroxine (SYNTHROID, LEVOTHROID) 300 MCG tablet Take 300 mcg by mouth daily before breakfast.    [provider]  lisinopril (PRINIVIL,ZESTRIL) 10 MG tablet Take 10 mg by mouth daily.  09/09/14   [provider]  metoprolol tartrate (LOPRESSOR) 100 MG tablet Take 100 mg by mouth 2 (two) times daily. 06/23/16   [provider]  Multiple Vitamin (MULTIVITAMIN PO) Take 1 tablet by mouth daily.    [provider]  Nutritional Supplements (ESTROVEN MAXIMUM STRENGTH PO) Take 1 tablet by mouth daily.    [provider]  omeprazole (PRILOSEC OTC) 20 MG tablet Take 20 mg by mouth daily.    [provider]  ondansetron (ZOFRAN) 8 MG tablet Take 8 mg by mouth every 8 (eight) hours as needed for nausea or vomiting.    [provider]  potassium chloride SA (K-DUR,KLOR-CON) 20 MEQ tablet Take 40 mEq by mouth 2 (two) times daily.     [provider]  pravastatin (PRAVACHOL) 20 MG tablet Take 20 mg by mouth daily.  06/25/13   [provider]  Probiotic Product (PROBIOTIC PO) Take 1 tablet by mouth daily.    [provider]  zinc gluconate 50 MG tablet Take 50 mg by mouth daily.    [provider]    Allergies    Lorabid [loracarbef], Augmentin [amoxicillin-pot clavulanate], Codeine, Gadolinium derivatives, Sulfa drugs cross reactors, Benicar [olmesartan medoxomil], and Other  Review of Systems   Review of Systems  Constitutional: Negative for chills and fever.  HENT: Negative for ear pain and sore throat.   Eyes: Negative for pain and visual disturbance.  Respiratory: Positive for shortness of breath. Negative for cough.   Cardiovascular: Positive for chest pain. Negative for palpitations.  Gastrointestinal: Negative for abdominal pain and vomiting.  Genitourinary: Negative for dysuria and hematuria.  Musculoskeletal: Negative for arthralgias and back pain.  Skin: Negative for color change and rash.  Neurological: Positive for tremors, weakness and numbness. Negative for seizures and syncope.  All other systems reviewed and are negative.   Physical Exam Updated Vital Signs BP 121/66   Pulse 63   Temp 98.1 F (36.7 C) (Oral)   Resp (!) 22   Ht 5\' 4"  (1.626 m)   Wt 113.4 kg   SpO2 99%   BMI 42.91 kg/m   Physical Exam Vitals and nursing note reviewed.  Constitutional:      General: She is not in acute distress.    Appearance: She is well-developed and well-nourished.  HENT:     Head: Normocephalic and atraumatic.  Eyes:     Conjunctiva/sclera: Conjunctivae normal.  Cardiovascular:     Rate and Rhythm: Normal rate and regular rhythm.     Heart sounds: No murmur heard.   Pulmonary:     Effort: Pulmonary effort is normal. No respiratory distress.     Breath sounds: Normal breath sounds.  Abdominal:     Palpations: Abdomen is soft.     Tenderness: There is no abdominal tenderness.  Musculoskeletal:        General: No edema.     Cervical back: Neck supple.  Skin:    General: Skin is warm and  dry.  Neurological:     Mental Status: She is alert.     Comments: AAOx3 CN 2-12 intact, speech clear visual fields intact 5/5 strength in b/l UE and  LE Sensation to light touch intact in b/l UE and LE Normal FNF Normal gait  Psychiatric:        Mood and Affect: Mood and affect normal.     ED Results / Procedures / Treatments   Labs (all labs ordered are listed, but only abnormal results are displayed) Labs Reviewed  COMPREHENSIVE METABOLIC PANEL - Abnormal; Notable for the following components:      Result Value   Glucose, Bld 216 (*)    Creatinine, Ser 1.13 (*)    Calcium 8.1 (*)    GFR, Estimated 57 (*)    All other components within normal limits  CBG MONITORING, ED - Abnormal; Notable for the following components:   Glucose-Capillary 205 (*)    All other components within normal limits  I-STAT CHEM 8, ED - Abnormal; Notable for the following components:   Creatinine, Ser 1.10 (*)    Glucose, Bld 211 (*)    Calcium, Ion 1.07 (*)    All other components within normal limits  RESP PANEL BY RT-PCR (FLU A&B, COVID) ARPGX2  ETHANOL  PROTIME-INR  APTT  CBC  DIFFERENTIAL  RAPID URINE DRUG SCREEN, HOSP PERFORMED  URINALYSIS, ROUTINE W REFLEX MICROSCOPIC  TROPONIN I (HIGH SENSITIVITY)  TROPONIN I (HIGH SENSITIVITY)    EKG EKG Interpretation  Date/Time:  Sunday March 09 2020 06:03:42 EST Ventricular Rate:  78 PR Interval:  182 QRS Duration: 90 QT Interval:  374 QTC Calculation: 426 R Axis:   -56 Text Interpretation: Normal sinus rhythm Left axis deviation Septal infarct , age undetermined Possible Lateral infarct , age undetermined Abnormal ECG Confirmed by Madalyn Rob [id number]) on 03/09/2020 11:11:30 AM   Radiology DG Chest 2 View  Result Date: 03/09/2020 CLINICAL DATA:  L sided chest tightness EXAM: CHEST - 2 VIEW COMPARISON:  11/15/2018 and prior FINDINGS: No focal consolidation. No pneumothorax or pleural effusion. Cardiomediastinal silhouette is within normal limits. No acute osseous abnormality. IMPRESSION: No focal airspace disease. Electronically Signed   By: Primitivo Gauze M.D.   On:  03/09/2020 06:59   CT HEAD WO CONTRAST  Result Date: 03/09/2020 CLINICAL DATA:  Numbness or tingling, paresthesia EXAM: CT HEAD WITHOUT CONTRAST TECHNIQUE: Contiguous axial images were obtained from the base of the skull through the vertex without intravenous contrast. COMPARISON:  04/30/2018 and prior FINDINGS: Brain: No acute infarct or intracranial hemorrhage. No mass lesion. No midline shift, ventriculomegaly or extra-axial fluid collection. Mild chronic microvascular ischemic changes. Vascular: No hyperdense vessel or unexpected calcification. Skull: No acute finding. Sinuses/Orbits: Normal orbits. Clear paranasal sinuses. No mastoid effusion. Other: None. IMPRESSION: No acute intracranial process. Mild chronic microvascular ischemic changes. Electronically Signed   By: Primitivo Gauze M.D.   On: 03/09/2020 06:53   MR BRAIN WO CONTRAST  Result Date: 03/09/2020 CLINICAL DATA:  Neuro deficit, acute, stroke suspected EXAM: MRI HEAD WITHOUT CONTRAST TECHNIQUE: Multiplanar, multiecho pulse sequences of the brain and surrounding structures were obtained without intravenous contrast. COMPARISON:  03/09/2020 and prior. FINDINGS: Brain: No diffusion-weighted signal abnormality. No intracranial hemorrhage. No midline shift, ventriculomegaly or extra-axial fluid collection. No mass lesion. Mild chronic microvascular ischemic changes. Vascular: Normal flow voids. Skull and upper cervical spine: Normal marrow signal. Sinuses/Orbits: Normal orbits. Clear paranasal sinuses. No mastoid effusion. Other: None. IMPRESSION: No acute intracranial process. Mild chronic  microvascular ischemic changes. Electronically Signed   By: Primitivo Gauze M.D.   On: 03/09/2020 09:28    Procedures Procedures (including critical care time)  Medications Ordered in ED Medications - No data to display  ED Course  I have reviewed the triage vital signs and the nursing notes.  Pertinent labs & imaging results that were  available during my care of the patient were reviewed by me and considered in my medical decision making (see chart for details).  Clinical Course as of 03/09/20 1237  Sun Mar 09, 2020  1208 D/w Leonel Ramsay - recommends hosp admission for full TIA w/u [RD]    Clinical Course User Index [RD] Lucrezia Starch, MD   MDM Rules/Calculators/A&P                         59 year old lady presents to the emergency room with concern for left facial numbness, arm numbness.  On arrival to ER, outside TPA window, presentation not consistent with LVO.  Symptoms have resolved.  Not stroke alert.  On my assessment, vital signs were stable, she was well-appearing in no distress with no focal neurologic deficits.  MRI brain was negative for acute stroke.  She had mentioned some chest tightness associated with the initial symptom.  No ongoing pain.  EKG without acute ischemic change, troponin normal limits, doubt ACS.  Reviewed case with on-call for neurology, Dr. Leonel Ramsay.  He recommended admission for full TIA work-up.  Recommended obtaining CT angio.  Consult unassigned admission.  Discussed case with hospitalist, Dr. Jamse Arn who accepts patient.   Final Clinical Impression(s) / ED Diagnoses Final diagnoses:  None    Rx / DC Orders ED Discharge Orders    None       Lucrezia Starch, MD 03/09/20 1309

## 2020-03-09 NOTE — ED Notes (Signed)
Patient transported to CT 

## 2020-03-09 NOTE — H&P (Signed)
History and Physical:    [name]   VOH:[id number] DOB: 05/11/1960 DOA: 03/09/2020  Referring MD/provider: Dr. Roslynn Amble PCP: Lin Landsman, MD   Patient coming from: Home  Chief Complaint: Left arm, left face numbness and left chest tightness  History of Present Illness:   [name] is an 59 y.o. female with PMH significant for HTN, DM 2, previous history of CVA, renal cell CA presently NED and not alcoholic F LD was in her usual state of health until 415 this morning when she woke up to go the bathroom and found that her left arm was numb and her left face was numb.  She also noted some tightness in her left chest.  She woke up her husband who drove her to the ED.  Her husband noted that she had a left facial droop at the time.  Patient symptoms lasted about 1 hour or so, however by the time patient was being evaluated the symptoms had all entirely resolved.  Patient states the chest discomfort resolved first followed by resolution of the arm and face numbness.  At present patient feels like she is back to baseline.  Patient's husband notes that she he does not see any facial droop any further.  Patient denies any previous chest pain other than the episode that occurred today.  Notes it was not exertional.  Describes it as a chest pressure.  No associated shortness of breath, nausea or vomiting.  No diaphoresis.  Chest discomfort resolved on its own.  ED Course:  The patient was worked up for TIA given resolution of symptoms.  MRI of the brain was negative for any CVA.  Patient was discussed with Dr. Leonel Ramsay of neurology who recommended admission for further work-up given risk factors for stroke.  He specifically recommended CT angiogram of head and neck prior to discharge.  ROS:   ROS   Review of Systems: General: Denies fever, chills, malaise,  Endocrine: Denies heat/cold intolerance, polyuria or weight loss. Respiratory: Denies cough, SOB at rest or  hemoptysis GI: Denies nausea, vomiting, diarrhea or constipation GU: Denies dysuria, frequency or hematuria   Past Medical History:   Past Medical History:  Diagnosis Date  . Anemia    hx of   . 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 Asthma    due to allergies   . Cancer ([place])    THRYOID 2016 [place] WITH SURGERY, KIDNEY CANCER MAY 2018  RIGHT RENAL CRYOABLATION  . Complication of anesthesia   . Diabetes mellitus    TYPE 2  . Dyspnea    increased exertion   . Dysrhythmia    " irregular heart rate per patient"  . Family history of adverse reaction to anesthesia    difficult for father to wake after surgery, AFTHER LOW BP WITH ANESTHESIA  . Heart murmur   . HOH (hard of hearing)   . Hypercholesteremia   . Hypertension   . PONV (postoperative nausea and vomiting)   . Renal cancer, right ([place]) dx'd 07/2018, 10/2018   ablation x 2   . Thyroid disease   . TIA (transient ischemic attack)    LAST TIA  APRIL 2020 ON PLAVIX, reports residual effects    Past Surgical History:   Past Surgical History:  Procedure Laterality Date  . ABDOMINAL HYSTERECTOMY     COMPLETE  . BIOPSY  03/02/2019   Procedure: BIOPSY;  Surgeon: Carol Ada, MD;  Location: [facility];  Service:  Endoscopy;;  . CHOLECYSTECTOMY    . COLON SURGERY    . COLONOSCOPY WITH PROPOFOL N/A 03/02/2019   Procedure: COLONOSCOPY WITH PROPOFOL;  Surgeon: Carol Ada, MD;  Location: [facility];  Service: Endoscopy;  Laterality: N/A;  . ear surgeries     . IR RADIOLOGIST EVAL & MGMT  01/12/2017  . IR RADIOLOGIST EVAL & MGMT  06/16/2017  . IR RADIOLOGIST EVAL & MGMT  08/31/2017  . IR RADIOLOGIST EVAL & MGMT  12/13/2017  . IR RADIOLOGIST EVAL & MGMT  03/16/2018  . IR RADIOLOGIST EVAL & MGMT  11/01/2018  . IR RADIOLOGIST EVAL & MGMT  12/13/2018  . IR RADIOLOGIST EVAL & MGMT  02/15/2019  . IR RADIOLOGIST EVAL & MGMT  08/16/2019  . IR RADIOLOGIST EVAL & MGMT  11/22/2019  . KNEE ARTHROSCOPY    . LEFT HEART CATHETERIZATION WITH CORONARY  ANGIOGRAM N/A 04/24/2014   Procedure: LEFT HEART CATHETERIZATION WITH CORONARY ANGIOGRAM;  Surgeon: Burnell Blanks, MD;  Location: [facility] CATH LAB;  Service: Cardiovascular;  Laterality: N/A;  . POLYPECTOMY  03/02/2019   Procedure: POLYPECTOMY;  Surgeon: Carol Ada, MD;  Location: [facility];  Service: Endoscopy;;  . RADIOFREQUENCY ABLATION Right 08/05/2017   Procedure: CT RENAL CRYO AND BIOPSY;  Surgeon: Sandi Mariscal, MD;  Location: [facility];  Service: Anesthesiology;  Laterality: Right;  . RADIOFREQUENCY ABLATION Right 11/15/2018   Procedure: RIGHT RENAL CRYOABLATION;  Surgeon: Sandi Mariscal, MD;  Location: [facility];  Service: Anesthesiology;  Laterality: Right;  . THYROIDECTOMY N/A 09/20/2014   Procedure: TOTAL THYROIDECTOMY;  Surgeon: Armandina Gemma, MD;  Location: [facility];  Service: General;  Laterality: N/A;  . TONSILLECTOMY      Social History:   Social History   Socioeconomic History  . Marital status: Married    Spouse name: Glendell Docker  . Number of children: 2  . Years of education: 88  . Highest education level: Not on file  Occupational History    Comment: NA, disabled  Tobacco Use  . Smoking status: Never Smoker  . Smokeless tobacco: Never Used  Vaping Use  . Vaping Use: Never used  Substance and Sexual Activity  . Alcohol use: No  . Drug use: No  . Sexual activity: Not on file  Other Topics Concern  . Not on file  Social History Narrative   Lives with husband and son   Social Determinants of Health   Financial Resource Strain: Not on file  Food Insecurity: Not on file  Transportation Needs: Not on file  Physical Activity: Not on file  Stress: Not on file  Social Connections: Not on file  Intimate Partner Violence: Not on file    Allergies   Lorabid [loracarbef], Amoxicillin, Augmentin [amoxicillin-pot clavulanate], Codeine, Gadolinium derivatives, Sulfa drugs cross reactors, Benicar [olmesartan medoxomil], Contrast media [iodinated diagnostic agents], and  Other  Family history:   Family History  Problem Relation Age of Onset  . Heart failure Mother   . Diabetes Mother   . Stroke Father   . Heart failure Father   . Cancer Father     Current Medications:   Prior to Admission medications   Medication Sig Start Date End Date Taking? Authorizing Provider  acetaminophen (TYLENOL) 500 MG tablet Take 2 tablets (1,000 mg total) by mouth every 6 (six) hours as needed (body aches). Patient taking differently: Take 1,000 mg by mouth every 4 (four) hours as needed for moderate pain or headache (body aches). 06/29/17  Yes Eulogio Bear U, DO  albuterol (  PROVENTIL HFA;VENTOLIN HFA) 108 (90 Base) MCG/ACT inhaler Inhale 1-2 puffs into the lungs every 6 (six) hours as needed for wheezing or shortness of breath. 12/28/17  Yes Jean Rosenthal, MD  b complex vitamins tablet Take 1 tablet by mouth daily.   Yes [provider]  calcium carbonate (OSCAL) 1500 (600 Ca) MG TABS tablet Take by mouth 2 (two) times daily with a meal. MAGNESIUM AND ZINC 2 IN AM 2 AT 1200 PM 2 AT DINNERTIME   Yes [provider]  clopidogrel (PLAVIX) 75 MG tablet Take 1 tablet (75 mg total) by mouth daily. 09/15/15  Yes Florencia Reasons, MD  diphenhydrAMINE (BENADRYL) 25 MG tablet Take 50 mg by mouth at bedtime.    Yes [provider]  fluticasone (FLONASE) 50 MCG/ACT nasal spray Place 2 sprays into both nostrils daily as needed for allergies. 03/04/20  Yes [provider]  furosemide (LASIX) 40 MG tablet Take 2 tablets (80 mg total) by mouth 2 (two) times daily. 05/17/16  Yes Vann, Jessica U, DO  ibuprofen (ADVIL) 200 MG tablet Take 800 mg by mouth every 8 (eight) hours as needed for headache or moderate pain.   Yes [provider]  JANUMET 50-1000 MG tablet Take 1 tablet by mouth 2 (two) times daily. 09/16/17  Yes [provider]  levothyroxine (SYNTHROID, LEVOTHROID) 300 MCG tablet Take 300 mcg by mouth daily before breakfast.   Yes [provider]  lisinopril (PRINIVIL,ZESTRIL) 10 MG tablet Take 10 mg by mouth daily.  09/09/14  Yes [provider]  metoprolol tartrate (LOPRESSOR) 100 MG tablet Take 100 mg by mouth 2 (two) times daily. 06/23/16  Yes [provider]  Multiple Vitamin (MULTIVITAMIN PO) Take 1 tablet by mouth daily.   Yes [provider]  omeprazole (PRILOSEC OTC) 20 MG tablet Take 20 mg by mouth daily.   Yes [provider]  ondansetron (ZOFRAN) 8 MG tablet Take 8 mg by mouth every 8 (eight) hours as needed for nausea or vomiting.   Yes [provider]  potassium chloride SA (K-DUR,KLOR-CON) 20 MEQ tablet Take 40 mEq by mouth 2 (two) times daily.   Yes [provider]  pravastatin (PRAVACHOL) 20 MG tablet Take 20 mg by mouth daily.  06/25/13  Yes [provider]  Probiotic Product (PROBIOTIC PO) Take 1 tablet by mouth daily.   Yes [provider]  zinc gluconate 50 MG tablet Take 50 mg by mouth daily.   Yes [provider]    Physical Exam:   Vitals:   03/09/20 1136 03/09/20 1233 03/09/20 1300 03/09/20 1330  BP:  121/66 132/79 106/82  Pulse:  63 64 65  Resp:  (!) 22 16 15   Temp: 98.1 F (36.7 C)     TempSrc: Oral     SpO2:  99% 99% 99%  Weight:      Height:         Physical Exam: Blood pressure 106/82, pulse 65, temperature 98.1 F (36.7 C), temperature source Oral, resp. rate 15, height 5\' 4"  (1.626 m), weight 113.4 kg, SpO2 99 %. Gen: Patient appearing much older than stated age lying in bed in no acute distress, attentive husband at bedside. Eyes: sclera anicteric, conjuctiva mildly injected bilaterally CVS: S1-S2, regulary, no gallops Respiratory:  decreased air entry likely secondary to decreased inspiratory effort GI: NABS, soft, NT  LE: No edema. No cyanosis Neuro: A/O x 3, Moving all extremities equally with normal strength, no left facial droop,  cranial nerve V and VII are intact, grossly nonfocal.  Psych:  patient is logical and coherent, judgement and insight appear normal, mood and affect appropriate to situation.   Data Review:    Labs: Basic Metabolic Panel: Recent Labs  Lab 03/09/20 0556 03/09/20 0609  NA 139 142  K 3.7 3.6  CL 104 105  CO2 22  --   GLUCOSE 216* 211*  BUN 11 12  CREATININE 1.13* 1.10*  CALCIUM 8.1*  --    Liver Function Tests: Recent Labs  Lab 03/09/20 0556  AST 21  ALT 22  ALKPHOS 57  BILITOT 0.5  PROT 6.8  ALBUMIN 3.6   No results for input(s): LIPASE, AMYLASE in the last 168 hours. No results for input(s): AMMONIA in the last 168 hours. CBC: Recent Labs  Lab 03/09/20 0556 03/09/20 0609  WBC 7.0  --   NEUTROABS 3.8  --   HGB 12.7 12.9  HCT 40.6 38.0  MCV 93.8  --   PLT 277  --    Cardiac Enzymes: No results for input(s): CKTOTAL, CKMB, CKMBINDEX, TROPONINI in the last 168 hours.  BNP (last 3 results) No results for input(s): PROBNP in the last 8760 hours. CBG: Recent Labs  Lab 03/09/20 0543  GLUCAP 205*    Urinalysis    Component Value Date/Time   COLORURINE YELLOW 03/09/2020 0610   APPEARANCEUR CLEAR 03/09/2020 0610   LABSPEC 1.009 03/09/2020 0610   PHURINE 5.0 03/09/2020 0610   GLUCOSEU NEGATIVE 03/09/2020 0610   HGBUR NEGATIVE 03/09/2020 0610   BILIRUBINUR NEGATIVE 03/09/2020 0610   KETONESUR NEGATIVE 03/09/2020 0610   PROTEINUR NEGATIVE 03/09/2020 0610   UROBILINOGEN 0.2 10/07/2014 1009   NITRITE NEGATIVE 03/09/2020 0610   LEUKOCYTESUR NEGATIVE 03/09/2020 0610      Radiographic Studies: DG Chest 2 View  Result Date: 03/09/2020 CLINICAL DATA:  L sided chest tightness EXAM: CHEST - 2 VIEW COMPARISON:  11/15/2018 and prior FINDINGS: No focal consolidation. No pneumothorax or pleural effusion. Cardiomediastinal silhouette is within normal limits. No acute osseous abnormality. IMPRESSION: No focal airspace disease. Electronically Signed   By: Primitivo Gauze M.D.   On: 03/09/2020 06:59   CT HEAD WO  CONTRAST  Result Date: 03/09/2020 CLINICAL DATA:  Numbness or tingling, paresthesia EXAM: CT HEAD WITHOUT CONTRAST TECHNIQUE: Contiguous axial images were obtained from the base of the skull through the vertex without intravenous contrast. COMPARISON:  04/30/2018 and prior FINDINGS: Brain: No acute infarct or intracranial hemorrhage. No mass lesion. No midline shift, ventriculomegaly or extra-axial fluid collection. Mild chronic microvascular ischemic changes. Vascular: No hyperdense vessel or unexpected calcification. Skull: No acute finding. Sinuses/Orbits: Normal orbits. Clear paranasal sinuses. No mastoid effusion. Other: None. IMPRESSION: No acute intracranial process. Mild chronic microvascular ischemic changes. Electronically Signed   By: Primitivo Gauze M.D.   On: 03/09/2020 06:53   MR BRAIN WO CONTRAST  Result Date: 03/09/2020 CLINICAL DATA:  Neuro deficit, acute, stroke suspected EXAM: MRI HEAD WITHOUT CONTRAST TECHNIQUE: Multiplanar, multiecho pulse sequences of the brain and surrounding structures were obtained without intravenous contrast. COMPARISON:  03/09/2020 and prior. FINDINGS: Brain: No diffusion-weighted signal abnormality. No intracranial hemorrhage. No midline shift, ventriculomegaly or extra-axial fluid collection. No mass lesion. Mild chronic microvascular ischemic changes. Vascular: Normal flow voids. Skull and upper cervical spine: Normal marrow signal. Sinuses/Orbits: Normal orbits. Clear paranasal sinuses. No mastoid effusion. Other: None. IMPRESSION: No acute intracranial process. Mild chronic microvascular ischemic changes. Electronically Signed   By: Primitivo Gauze M.D.   On:  03/09/2020 09:28    EKG: Independently reviewed.  Sinus rhythm at 80.  Left axis deviation at -50.  Q in 3, F, V1 through V3.  Extremely poor R wave progression, in fact there is no R wave even at V6.  No acute ST-T wave changes.   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  MRI brain already negative for CVA so far.  TIA She is already on Plavix and pravastatin CTA head and neck already ordered Patient states she is allergic to MRI dye but not CT dye Echocardiogram ordered Neurology consultation  Chest discomfort Chest discomfort is atypical and that it is associated with sensory symptoms of the left side of her body However she does have an EKG which shows Q's in 3 and F as well as through septal area with very poor R wave progression. Troponins are 5 and 6 here, chest discomfort has resolved. Echocardiogram has been ordered for stroke work-up, if there are segmental wall motion abnormalities can consider referral to cardiology. Patient states she does have [facility].  HTN Continue lisinopril, metoprolol and Lasix  DM 2 Hold Janumet SSI AC at bedtime  Hypothyroidism Continue Synthroid  NAFLD Continue Lasix and metoprolol  Renal cell CA in remission Outpatient follow-up already arranged    Other information:   DVT prophylaxis: Lovenox ordered. Code Status: Full Family Communication: Patient's husband was at bedside throughout Disposition Plan: Home Consults called: Neurology Admission status: Observation  Veverly Larimer Tublu Aubery Douthat [facility]  If 7PM-7AM, please contact night-coverage [email or web address] Password [email or web address] 03/09/2020, 1:45 PM

## 2020-03-10 ENCOUNTER — Observation Stay (HOSPITAL_BASED_OUTPATIENT_CLINIC_OR_DEPARTMENT_OTHER): Payer: Medicare Other

## 2020-03-10 ENCOUNTER — Encounter (HOSPITAL_COMMUNITY): Payer: Self-pay | Admitting: Internal Medicine

## 2020-03-10 ENCOUNTER — Other Ambulatory Visit: Payer: Self-pay

## 2020-03-10 DIAGNOSIS — G459 Transient cerebral ischemic attack, unspecified: Secondary | ICD-10-CM

## 2020-03-10 DIAGNOSIS — I10 Essential (primary) hypertension: Secondary | ICD-10-CM

## 2020-03-10 DIAGNOSIS — E1140 Type 2 diabetes mellitus with diabetic neuropathy, unspecified: Secondary | ICD-10-CM | POA: Diagnosis not present

## 2020-03-10 DIAGNOSIS — I499 Cardiac arrhythmia, unspecified: Secondary | ICD-10-CM | POA: Diagnosis not present

## 2020-03-10 DIAGNOSIS — E7800 Pure hypercholesterolemia, unspecified: Secondary | ICD-10-CM | POA: Diagnosis not present

## 2020-03-10 LAB — LIPID PANEL
Cholesterol: 160 mg/dL (ref 0–200)
HDL: 37 mg/dL — ABNORMAL LOW (ref >40)
LDL Cholesterol: 54 mg/dL (ref 0–99)
Total CHOL/HDL Ratio: 4.3 RATIO
Triglycerides: 345 mg/dL — ABNORMAL HIGH (ref <150)
VLDL: 69 mg/dL — ABNORMAL HIGH (ref 0–40)

## 2020-03-10 LAB — ECHOCARDIOGRAM COMPLETE
AR max vel: 1.95 cm2
AV Area VTI: 1.96 cm2
AV Area mean vel: 1.92 cm2
AV Mean grad: 4 mmHg
AV Peak grad: 7.6 mmHg
Ao pk vel: 1.38 m/s
Area-P 1/2: 4.31 cm2
Height: 64 in
S' Lateral: 3 cm
Weight: 4000.03 oz

## 2020-03-10 LAB — HEMOGLOBIN A1C
Hgb A1c MFr Bld: 8.4 % — ABNORMAL HIGH (ref 4.8–5.6)
Mean Plasma Glucose: 194.38 mg/dL

## 2020-03-10 LAB — GLUCOSE, CAPILLARY
Glucose-Capillary: 172 mg/dL — ABNORMAL HIGH (ref 70–99)
Glucose-Capillary: 227 mg/dL — ABNORMAL HIGH (ref 70–99)
Glucose-Capillary: 187 mg/dL — ABNORMAL HIGH (ref 70–99)

## 2020-03-10 MED ORDER — ASPIRIN 81 MG PO TBEC: 81.0000 mg | DELAYED_RELEASE_TABLET | Freq: Every day | ORAL | 0 refills | Status: AC

## 2020-03-10 MED ORDER — ASPIRIN EC 81 MG PO TBEC
81.0000 mg | DELAYED_RELEASE_TABLET | Freq: Every day | ORAL | Status: DC
  Administered 2020-03-10: 09:00:00 81 mg via ORAL
  Filled 2020-03-10: qty 1

## 2020-03-10 NOTE — Evaluation (Signed)
Occupational Therapy Evaluation Patient Details Name: [name] MRN: [id number] DOB: 07/24/1960 Today's Date: 03/10/2020    History of Present Illness [name] is a 59 y.o. female PMHx previous history of CVA, renal cell CA, HTN, DM 2 and last normal previous night woke ~0415 on 03/09/2020 to go the bathroom and realized her left face and arm were numb. She also noted some tightness in her left chest and L facial droop. Symptoms lasted 1 hour before resolving. Pt transferred for TIA evaluation.   Clinical Impression   PTA, pt lives with spouse and reports Independence with ADLs, IADLs and mobility. Pt presents now with normalized sensation, B UE strength WFL and reports feeling back to baseline. Pt able to ambulate to bathroom, perform toileting task and ambulate in hallway without assistance or with use of AD. Pt with limping gait due to L knee pain and hx of ankle/foot issues, but no overt LOB and pt confirming this is typical mobility for her. Reinforced stroke symptoms and when to seek medical care with pt verbalizing understanding of all education. VSS on RA. No further skilled OT services needed at this time. OT to sign off.     Follow Up Recommendations  No OT follow up    Equipment Recommendations  None recommended by OT    Recommendations for Other Services       Precautions / Restrictions Precautions Precautions: None Restrictions Weight Bearing Restrictions: No      Mobility Bed Mobility Overal bed mobility: Independent                  Transfers Overall transfer level: Independent Equipment used: None             General transfer comment: No assist to power up without AD. Pt reports limping gait as baseline, more impacted by L knee pain    Balance Overall balance assessment: No apparent balance deficits (not formally assessed)                                         ADL either performed or assessed with clinical  judgement   ADL Overall ADL's : Independent                                       General ADL Comments: Independent for donning shoes, walking to bathroom and in hallway, performed toileting task and hand hygiene without assist     Vision Baseline Vision/History: No visual deficits Patient Visual Report: No change from baseline Vision Assessment?: No apparent visual deficits     Perception     Praxis      Pertinent Vitals/Pain       Hand Dominance Right   Extremity/Trunk Assessment Upper Extremity Assessment Upper Extremity Assessment: Overall WFL for tasks assessed   Lower Extremity Assessment Lower Extremity Assessment: Defer to PT evaluation   Cervical / Trunk Assessment Cervical / Trunk Assessment: Kyphotic   Communication Communication Communication: No difficulties   Cognition Arousal/Alertness: Awake/alert Behavior During Therapy: WFL for tasks assessed/performed Overall Cognitive Status: Within Functional Limits for tasks assessed                                 General Comments: Pleasant and talkative  General Comments  Pt reports hx of TIAs, as well as her mother so familiar with symptoms and when to seek medical care    Exercises     Shoulder Instructions      Home Living Family/patient expects to be discharged to:: Private residence Living Arrangements: Spouse/significant other Available Help at Discharge: Family;Available 24 hours/day Type of Home: House Home Access: Stairs to enter [employer] of Steps: 2 Entrance Stairs-Rails:  (pole) Home Layout: One level     Bathroom Shower/Tub: Teacher, early years/pre: Standard     Home Equipment: Cane - single point          Prior Functioning/Environment Level of Independence: Independent        Comments: Typically independent with mobility, rare use of cane. Hx of ankle/L knee pain while ambulating. Independent with ADLs and IADLs in  the home. Does not work due to pain and residual deficits from CA. Husband assists with tub transfers to ensure safety. Pt denies any falls        OT Problem List:        OT Treatment/Interventions:      OT Goals(Current goals can be found in the care plan section) Acute Rehab OT Goals Patient Stated Goal: go home today OT Goal Formulation: All assessment and education complete, DC therapy  OT Frequency:     Barriers to D/C:            Co-evaluation              AM-PAC OT "6 Clicks" Daily Activity     Outcome Measure Help from another person eating meals?: None Help from another person taking care of personal grooming?: None Help from another person toileting, which includes using toliet, bedpan, or urinal?: None Help from another person bathing (including washing, rinsing, drying)?: None Help from another person to put on and taking off regular upper body clothing?: None Help from another person to put on and taking off regular lower body clothing?: None 6 Click Score: 24   End of Session    Activity Tolerance: Patient tolerated treatment well Patient left: in bed;with call bell/phone within reach                   Time: 0734-0803 OT Time Calculation (min): 29 min Charges:  OT General Charges $OT Visit: 1 Visit OT Evaluation $OT Eval Low Complexity: 1 Low OT Treatments $Self Care/Home Management : 8-22 mins  [name], OTR/L  [name] 03/10/2020, 8:13 AM

## 2020-03-10 NOTE — Progress Notes (Addendum)
STROKE TEAM PROGRESS NOTE   INTERVAL HISTORY Her RN is at the bedside.  Pt lying in bed, stated that her left sided numbness resolved in 15-20 min yesterday. No HA. Currently asymptomatic. She has hx of "irregular heart beat" following with Dr. Philbert Riser at [facility], was told "arrhtymia too short duration, no need to treat". She is agreeing with long term heart monitoring as outpt.   Vitals:   03/09/20 1947 03/09/20 2147 03/09/20 2340 03/10/20 0440  BP: 112/62 119/68 127/69 124/74  Pulse: 78 71 76 73  Resp: 17 15 17 15   Temp: 97.9 F (36.6 C) 97.7 F (36.5 C)  98.8 F (37.1 C)  TempSrc: Oral Oral Oral Oral  SpO2: 97% 97% 96% 95%  Weight:      Height:       CBC:  Recent Labs  Lab 03/09/20 0556 03/09/20 0609  WBC 7.0  --   NEUTROABS 3.8  --   HGB 12.7 12.9  HCT 40.6 38.0  MCV 93.8  --   PLT 277  --    Basic Metabolic Panel:  Recent Labs  Lab 03/09/20 0556 03/09/20 0609  NA 139 142  K 3.7 3.6  CL 104 105  CO2 22  --   GLUCOSE 216* 211*  BUN 11 12  CREATININE 1.13* 1.10*  CALCIUM 8.1*  --    Lipid Panel:  Recent Labs  Lab 03/10/20 0306  CHOL 160  TRIG 345*  HDL 37*  CHOLHDL 4.3  VLDL 69*  LDLCALC 54   HgbA1c:  Recent Labs  Lab 03/10/20 0306  HGBA1C 8.4*   Urine Drug Screen:  Recent Labs  Lab 03/09/20 0610  LABOPIA NONE DETECTED  COCAINSCRNUR NONE DETECTED  LABBENZ NONE DETECTED  AMPHETMU NONE DETECTED  THCU NONE DETECTED  LABBARB NONE DETECTED    Alcohol Level  Recent Labs  Lab 03/09/20 0556  ETH <10    IMAGING past 24 hours CT Angio Head W or Wo Contrast  Result Date: 03/09/2020 CLINICAL DATA:  59 year old female with neurologic deficit, TIA. Persistent left face numbness. EXAM: CT ANGIOGRAPHY HEAD AND NECK TECHNIQUE: Multidetector CT imaging of the head and neck was performed using the standard protocol during bolus administration of intravenous contrast. Multiplanar CT image reconstructions and MIPs were obtained to evaluate the vascular  anatomy. Carotid stenosis measurements (when applicable) are obtained utilizing NASCET criteria, using the distal internal carotid diameter as the denominator. CONTRAST:  7mL OMNIPAQUE IOHEXOL 350 MG/ML SOLN COMPARISON:  Brain MRI 0832 hours today. Intracranial MRA 05/16/2016. FINDINGS: CTA NECK Skeleton: Partially absent dentition, no acute dental finding identified. Lower cervical disc and endplate degeneration. No acute osseous abnormality identified. Upper chest: Partially visible calcified series 5, image 170) coronary artery atherosclerosis (. Negative visible central pulmonary artery. No superior mediastinal lymphadenopathy. Negative lung apices. Other neck: Surgically absent thyroid. No lymphadenopathy or acute finding in the neck. Aortic arch: 3 vessel arch configuration.  No arch atherosclerosis. Right carotid system: Tortuous proximal right CCA. Negative right carotid bifurcation. Mildly tortuous right ICA just below the skull. No plaque or stenosis. Left carotid system: Mildly tortuous left carotid system similar to that on the right. Minimal plaque at the left ICA origin and bulb. No stenosis. Vertebral arteries: Proximal right subclavian artery appears normal. There is mild stenosis of the proximal right vertebral artery from its origin (series 8, image 168). Tortuous right V1 and V2 segments. But patent right vertebral to the skull base without additional stenosis. Normal proximal left subclavian artery and  left vertebral artery origin. Tortuous left V1 and V2 segments. Patent left vertebral to the skull base without stenosis. CTA HEAD Posterior circulation: Fairly codominant distal vertebral arteries are patent to the basilar without plaque or stenosis. Normal PICA origins. Patent basilar artery, diminutive, but without focal plaque or stenosis. SCA and PCA origins are patent. Small right posterior communicating artery. Bilateral PCA branches are patent. No focal stenosis. Anterior circulation: Both  ICA siphons are patent and somewhat diminutive similar to the basilar. Minimal siphon calcified plaque mostly on the right. No discrete siphon stenosis. Normal right posterior communicating artery origin. Patent carotid termini, MCA and ACA origins. Anterior communicating artery and bilateral ACA branches are within normal limits. Left MCA M1 segment and bifurcation are patent without stenosis. Right MCA M1 segment and bifurcation are patent without stenosis. Bilateral MCA branches are within normal limits. Anterior and posterior circulation appear stable from the 2018 MRA. Venous sinuses: Patent, although effaced appearance of the bilateral transverse and sigmoid sinus junctions, perhaps related to normal arachnoid granulations. Anatomic variants: None. Review of the MIP images confirms the above findings IMPRESSION: 1. Negative for large vessel occlusion. 2. Minimal atherosclerosis in the head and neck. No significant arterial stenosis, although the Basilar and both ICA Siphons all appear somewhat diminutive. Tortuous cervical carotid arteries. 3. Chronic partially empty sella, as well as an effaced appearance at the transverse and sigmoid venous sinus junctions. These can be normal anatomic variation, but also can be associated with idiopathic intracranial hypertension (pseudotumor cerebri). 4. Partially visible calcified coronary artery atherosclerosis. 5. Surgically absent thyroid. Electronically Signed   By: Genevie Ann M.D.   On: 03/09/2020 14:36   CT Angio Neck W and/or Wo Contrast  Result Date: 03/09/2020 CLINICAL DATA:  59 year old female with neurologic deficit, TIA. Persistent left face numbness. EXAM: CT ANGIOGRAPHY HEAD AND NECK TECHNIQUE: Multidetector CT imaging of the head and neck was performed using the standard protocol during bolus administration of intravenous contrast. Multiplanar CT image reconstructions and MIPs were obtained to evaluate the vascular anatomy. Carotid stenosis measurements  (when applicable) are obtained utilizing NASCET criteria, using the distal internal carotid diameter as the denominator. CONTRAST:  58mL OMNIPAQUE IOHEXOL 350 MG/ML SOLN COMPARISON:  Brain MRI 0832 hours today. Intracranial MRA 05/16/2016. FINDINGS: CTA NECK Skeleton: Partially absent dentition, no acute dental finding identified. Lower cervical disc and endplate degeneration. No acute osseous abnormality identified. Upper chest: Partially visible calcified series 5, image 170) coronary artery atherosclerosis (. Negative visible central pulmonary artery. No superior mediastinal lymphadenopathy. Negative lung apices. Other neck: Surgically absent thyroid. No lymphadenopathy or acute finding in the neck. Aortic arch: 3 vessel arch configuration.  No arch atherosclerosis. Right carotid system: Tortuous proximal right CCA. Negative right carotid bifurcation. Mildly tortuous right ICA just below the skull. No plaque or stenosis. Left carotid system: Mildly tortuous left carotid system similar to that on the right. Minimal plaque at the left ICA origin and bulb. No stenosis. Vertebral arteries: Proximal right subclavian artery appears normal. There is mild stenosis of the proximal right vertebral artery from its origin (series 8, image 168). Tortuous right V1 and V2 segments. But patent right vertebral to the skull base without additional stenosis. Normal proximal left subclavian artery and left vertebral artery origin. Tortuous left V1 and V2 segments. Patent left vertebral to the skull base without stenosis. CTA HEAD Posterior circulation: Fairly codominant distal vertebral arteries are patent to the basilar without plaque or stenosis. Normal PICA origins. Patent basilar artery, diminutive, but  without focal plaque or stenosis. SCA and PCA origins are patent. Small right posterior communicating artery. Bilateral PCA branches are patent. No focal stenosis. Anterior circulation: Both ICA siphons are patent and somewhat  diminutive similar to the basilar. Minimal siphon calcified plaque mostly on the right. No discrete siphon stenosis. Normal right posterior communicating artery origin. Patent carotid termini, MCA and ACA origins. Anterior communicating artery and bilateral ACA branches are within normal limits. Left MCA M1 segment and bifurcation are patent without stenosis. Right MCA M1 segment and bifurcation are patent without stenosis. Bilateral MCA branches are within normal limits. Anterior and posterior circulation appear stable from the 2018 MRA. Venous sinuses: Patent, although effaced appearance of the bilateral transverse and sigmoid sinus junctions, perhaps related to normal arachnoid granulations. Anatomic variants: None. Review of the MIP images confirms the above findings IMPRESSION: 1. Negative for large vessel occlusion. 2. Minimal atherosclerosis in the head and neck. No significant arterial stenosis, although the Basilar and both ICA Siphons all appear somewhat diminutive. Tortuous cervical carotid arteries. 3. Chronic partially empty sella, as well as an effaced appearance at the transverse and sigmoid venous sinus junctions. These can be normal anatomic variation, but also can be associated with idiopathic intracranial hypertension (pseudotumor cerebri). 4. Partially visible calcified coronary artery atherosclerosis. 5. Surgically absent thyroid. Electronically Signed   By: Genevie Ann M.D.   On: 03/09/2020 14:36   MR BRAIN WO CONTRAST  Result Date: 03/09/2020 CLINICAL DATA:  Neuro deficit, acute, stroke suspected EXAM: MRI HEAD WITHOUT CONTRAST TECHNIQUE: Multiplanar, multiecho pulse sequences of the brain and surrounding structures were obtained without intravenous contrast. COMPARISON:  03/09/2020 and prior. FINDINGS: Brain: No diffusion-weighted signal abnormality. No intracranial hemorrhage. No midline shift, ventriculomegaly or extra-axial fluid collection. No mass lesion. Mild chronic microvascular  ischemic changes. Vascular: Normal flow voids. Skull and upper cervical spine: Normal marrow signal. Sinuses/Orbits: Normal orbits. Clear paranasal sinuses. No mastoid effusion. Other: None. IMPRESSION: No acute intracranial process. Mild chronic microvascular ischemic changes. Electronically Signed   By: Primitivo Gauze M.D.   On: 03/09/2020 09:28    PHYSICAL EXAM  Temp:  [98.1 F (36.7 C)] 98.1 F (36.7 C) (12/13 1219) Pulse Rate:  [66] 66 (12/13 1219) Resp:  [16] 16 (12/13 1219) BP: (140)/(88) 140/88 (12/13 1219) SpO2:  [100 %] 100 % (12/13 1219)  General - morbid obesity, well developed, in no apparent distress.  Ophthalmologic - fundi not visualized due to noncooperation.  Cardiovascular - Regular rhythm and rate.  Mental Status -  Level of arousal and orientation to time, place, and person were intact. Language including expression, naming, repetition, comprehension was assessed and found intact. Fund of Knowledge was assessed and was intact.  Cranial Nerves II - XII - II - Visual field intact OU. III, IV, VI - Extraocular movements intact. V - Facial sensation intact bilaterally. VII - Facial movement intact bilaterally. VIII - Hearing & vestibular intact bilaterally. X - Palate elevates symmetrically. XI - Chin turning & shoulder shrug intact bilaterally. XII - Tongue protrusion intact.  Motor Strength - The patient's strength was normal in all extremities and pronator drift was absent.  Bulk was normal and fasciculations were absent.   Motor Tone - Muscle tone was assessed at the neck and appendages and was normal.  Reflexes - The patient's reflexes were symmetrical in all extremities and she had no pathological reflexes.  Sensory - Light touch, temperature/pinprick were assessed and were symmetrical.    Coordination - The patient had normal  movements in the hands with no ataxia or dysmetria.  Tremor was absent.  Gait and Station -  deferred.   ASSESSMENT/PLAN [name] is a 59 y.o. female with history of CVA, renal cell CA, HTN, DM 2 presenting with transient L facial droop and sensory abnormalities in face and arm which resolved after 1 hr.  R brain TIA - etiology unclear  CT head No acute abnormality. Mild Small vessel disease.   MRI  No acute abnormality. Mild Small vessel disease.   CTA head & neck no LVO. Minimal atherosclerosis. Partially empty sella w/ effaced transverse and sigmoid sinus. Coronary atherosclerosis.  2D Echo EF 60-65%  LDL 54  HgbA1c 8.4  VTE prophylaxis - Lovenox 40 mg sq daily   clopidogrel 75 mg daily prior to admission, now on aspirin 81 mg daily and clopidogrel 75 mg daily for 3 weeks and then plavix alone.  Therapy recommendations:  No therapy needs  Disposition:  Return home  Arrhythmia   Has been following with Dr. Philbert Riser at [facility]  Had 7- or 15-day Holter monitoring in the past  Was told to have "arrhythmia but too short duration, no need to treat"  Pt agree with long term cardiac monitoring  Will refer to Dr. Rayann Heman for outpt loop recorder.  Hx of TIA vs. Complicated migraine   03/6107 - L parotiditis. Presented w/ slurred speech and R facial droop w/ chest, jaw and neck pain.   05/16/16 - HA with left face arm tingling and numbness - resolved in ER and MRI neg, on plavix  04/2018 - HA, dysarthria, left sided weakness - MRI neg, LDL 54, A1C 6.8. CUS neg. MRA neg. Consider TIA vs. Complicated migraine. Put on DAPT x 3 weeks then plavix alone.  Followed with Dr. Leta Baptist at [facility]  Hypertension  Stable . BP goal normotensive  Hyperlipidemia  Home meds:  pravachol 20, resumed in hospital  LDL 54, goal < 70 for stroke  Continue statin at discharge  Diabetes type II Uncontrolled  HgbA1c 8.4, goal < 7.0  CBGs  SSI  Close PCP follow up for better DM control  Other Stroke Risk Factors  Morbid Obesity, Body mass index is 42.91 kg/m., BMI  >/= 30 associated with increased stroke risk, recommend weight loss, diet and exercise as appropriate   Family hx stroke (father)  CAD, nonobstructive 2017  Thoracic AAA  Other Active Problems  Chest discomfort. Trop neg   Hypothyroidism on synthroid  NAFLD on lasix and metoprolol  Hx thyroid cancer s/p OR 2016  Hx kidney cancer s/p R cryoablation 2018  Hospital day # 0  Neurology will sign off. Please call with questions. Pt will follow up with Dr. Leta Baptist at [facility] in about 4 weeks. Thanks for the consult.  Rosalin Hawking, MD PhD Stroke Neurology 03/11/2020 7:14 AM  To contact Stroke Continuity provider, please refer to [email or web address] After hours, contact General Neurology

## 2020-03-10 NOTE — Discharge Summary (Signed)
Physician Discharge Summary  [name] UXL:[id number] DOB: [birth year] DOA: 03/09/2020  PCP: Lin Landsman, MD  Admit date: 03/09/2020 Discharge date: 03/10/2020  Admitted From: Home Disposition: Home  Recommendations for Outpatient Follow-up:  1. Follow up with PCP in 1-2 weeks 2. Follow-up with neurology, Dr. Lisbeth Ply in week weeks 3. Needs further management/optimization of her diabetic regimen with poorly controlled diabetes with a hemoglobin A1c of 8.4 4. Started on aspirin 81 mg p.o. daily  Home Health: No Equipment/Devices: None  Discharge Condition: Stable CODE STATUS: Full code Diet recommendation: Heart healthy/consistent carbohydrate diet  History of present illness:  [name] is a 59 year old female with past medical history significant for essential hypertension, type 2 diabetes mellitus, history of prior CVA, renal cell carcinoma, NAFLD, who presented to the ER with complaints of left arm and left face paresthesias.  Patient also reported tightness in her chest.  Onset of symptoms upon awakening this morning.  Patient reports symptoms lasted for roughly 1 hour, but resolved at time of evaluation in the ED.  In regards to her chest pain, described as exertional chest pressure without shortness of breath, nausea, vomiting, no diaphoresis.  No worsening with exertion and self resolved without medication.  In the ED, temperature 98.3, HR 84, RR 18, BP 146/88, SPO2 100% on room air.  WBC 7.0, hemoglobin 12.7, platelets 277.  Sodium 139, potassium 3.7, chloride 104, CO2 22, glucose 216.  BUN 11, creatinine 1.13.  Troponin 6>5; within normal limits.  SARS-CoV-2/influenza A/B PCR negative.  Urinalysis unrevealing, UDS negative.  CT head with no acute intracranial abnormality, but does note chronic microvascular ischemic changes.  Neurology was consulted.  Hospitalist service consulted for admission and further evaluation and management of suspected TIA versus  CVA.  Hospital course:  Transient ischemic attack Patient presenting from home with left upper extremity and left facial paresthesias lasting for 1 hour and self resolve at time of evaluation in the ED.  Patient with prior history of CVA, currently on Plavix outpatient.  Neurology was consulted and followed during hospital course.  CT head without acute intracranial abnormality but does note chronic microvascular ischemic changes.  MR brain with no acute intracranial findings.  CT angiogram head/neck with no large vessel occlusion, no significant arterial stenosis and with finding of chronic partially empty sella.  Hemoglobin A1c 8.4, not optimally controlled.  Lipid panel with total cholesterol 160, HDL 37, LDL 54, triglycerides 345.  TTE with LVEF 60-65%, no LV regional wall motion normalities, grade 2 diastolic dysfunction, mild aortic dilation 40 mm.  Patient already on Plavix and 5 mg p.o. daily at home.  Aspirin 81 mg p.o. daily added.  Outpatient follow-up with neurology, Dr. Lisbeth Ply in 4 weeks.  Atypical chest pain Self resolved.  Troponins negative.  TTE without regional wall motion abnormalities and preserved EF.  Ascending aorta dilation Incidental finding of ascending aortic dilation of 40 mm.  Will need continued outpatient surveillance.   Essential hypertension Chronic diastolic congestive heart failure TTE with LVEF 53-66%, grade 2 diastolic dysfunction.  Continue lisinopril 10 mg p.o. daily, metoprolol tartrate 100 mg p.o. twice daily, furosemide 80 mg p.o. twice daily.  Aspirin and statin.  Type 2 diabetes mellitus Hemoglobin A1c 8.4, not optimally controlled.  Continue Janumet 50-1000 mg p.o. twice daily.  Outpatient follow-up with PCP for further guidance and titration of diabetic regimen.  Hypothyroidism Continue levothyroxine 300 mcg p.o. daily  Hyperlipidemia Continue pravastatin 20 g p.o. daily  Nonalcoholic fatty liver disease Continue  furosemide.  Outpatient  follow-up with gastroenterology, Dr. Benson Norway.  Renal cell carcinoma, in remission Continue outpatient follow-up with urology, Dr. Gloriann Loan  Morbid obesity Body mass index is 42.91 kg/m.  Discussed with patient needs for aggressive lifestyle changes/weight loss as this complicates all facets of care.   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   Fatty liver disease, nonalcoholic   Renal cell carcinoma of right kidney [facility])    Discharge Instructions  Discharge Instructions    Call MD for:  difficulty breathing, headache or visual disturbances   Complete by: As directed    Call MD for:  extreme fatigue   Complete by: As directed    Call MD for:  persistant dizziness or light-headedness   Complete by: As directed    Call MD for:  persistant nausea and vomiting   Complete by: As directed    Call MD for:  severe uncontrolled pain   Complete by: As directed    Call MD for:  temperature >100.4   Complete by: As directed    Diet - low sodium heart healthy   Complete by: As directed    Diet Carb Modified   Complete by: As directed    Increase activity slowly   Complete by: As directed      Allergies as of 03/10/2020      Reactions   Lorabid [loracarbef] Anaphylaxis   Amoxicillin Other (See Comments)   Augmentin [amoxicillin-pot Clavulanate] Hives, Nausea And Vomiting, Other (See Comments)   Chest pain   Codeine Nausea And Vomiting   Gadolinium Derivatives Itching, Swelling, Other (See Comments)   Patient will require 13 hr prep prior to administration of gadolinium     Sulfa Drugs Cross Reactors Other (See Comments)   Severe allergic reaction as a child - was not told symptoms    Benicar [olmesartan Medoxomil] Swelling, Rash   Other Rash   Green peas      Medication List    TAKE these medications   acetaminophen 500 MG tablet Commonly known as: TYLENOL Take 2 tablets (1,000 mg total) by mouth every 6 (six) hours as needed (body aches). What changed:   when to take this  reasons to take this   albuterol 108 (90 Base) MCG/ACT inhaler Commonly known as: VENTOLIN HFA Inhale 1-2 puffs into the lungs every 6 (six) hours as needed for wheezing or shortness of breath.   aspirin 81 MG EC tablet Take 1 tablet (81 mg total) by mouth daily. Swallow whole. Start taking on: March 11, 2020   b complex vitamins tablet Take 1 tablet by mouth daily.   calcium carbonate 1500 (600 Ca) MG Tabs tablet Commonly known as: OSCAL Take by mouth 2 (two) times daily with a meal. MAGNESIUM AND ZINC 2 IN AM 2 AT 1200 PM 2 AT DINNERTIME   clopidogrel 75 MG tablet Commonly known as: PLAVIX Take 1 tablet (75 mg total) by mouth daily.   diphenhydrAMINE 25 MG tablet Commonly known as: BENADRYL Take 50 mg by mouth at bedtime.   fluticasone 50 MCG/ACT nasal spray Commonly known as: FLONASE Place 2 sprays into both nostrils daily as needed for allergies.   furosemide 40 MG tablet Commonly known as:  LASIX Take 2 tablets (80 mg total) by mouth 2 (two) times daily.   ibuprofen 200 MG tablet Commonly known as: ADVIL Take 800 mg by mouth every 8 (eight) hours as needed for headache or moderate pain.   Janumet 50-1000 MG tablet Generic drug: sitaGLIPtin-metformin Take 1 tablet by mouth 2 (two) times daily.   levothyroxine 300 MCG tablet Commonly known as: SYNTHROID Take 300 mcg by mouth daily before breakfast.   lisinopril 10 MG tablet Commonly known as: ZESTRIL Take 10 mg by mouth daily.   metoprolol tartrate 100 MG tablet Commonly known as: LOPRESSOR Take 100 mg by mouth 2 (two) times daily.   MULTIVITAMIN PO Take 1 tablet by mouth daily.   omeprazole 20 MG  tablet Commonly known as: PRILOSEC OTC Take 20 mg by mouth daily.   ondansetron 8 MG tablet Commonly known as: ZOFRAN Take 8 mg by mouth every 8 (eight) hours as needed for nausea or vomiting.   potassium chloride SA 20 MEQ tablet Commonly known as: KLOR-CON Take 40 mEq by mouth 2 (two) times daily.   pravastatin 20 MG tablet Commonly known as: PRAVACHOL Take 20 mg by mouth daily.   PROBIOTIC PO Take 1 tablet by mouth daily.   zinc gluconate 50 MG tablet Take 50 mg by mouth daily.       Follow-up Information    Lin Landsman, MD. Schedule an appointment as soon as possible for a visit in 1 week(s).   Specialty: Family Medicine Contact information: [place] [address] [phone]        Penni Bombard, MD. Schedule an appointment as soon as possible for a visit in 4 day(s).   Specialties: Neurology, Radiology Contact information: [address] [place] [address] [phone]              Allergies  Allergen Reactions  . Lorabid [Loracarbef] Anaphylaxis  . Amoxicillin Other (See Comments)  . Augmentin [Amoxicillin-Pot Clavulanate] Hives, Nausea And Vomiting and Other (See Comments)    Chest pain  . Codeine Nausea And Vomiting  . Gadolinium Derivatives Itching, Swelling and Other (See Comments)    Patient will require 13 hr prep prior to administration of gadolinium    . Sulfa Drugs Cross Reactors Other (See Comments)    Severe allergic reaction as a child - was not told symptoms  . Benicar [Olmesartan Medoxomil] Swelling and Rash  . Other Rash    Green peas    Consultations:  Neurology   Procedures/Studies: CT Angio Head W or Wo Contrast  Result Date: 03/09/2020 CLINICAL DATA:  59 year old female with neurologic deficit, TIA. Persistent left face numbness. EXAM: CT ANGIOGRAPHY HEAD AND NECK TECHNIQUE: Multidetector CT imaging of the head and neck was performed using the standard protocol during bolus  administration of intravenous contrast. Multiplanar CT image reconstructions and MIPs were obtained to evaluate the vascular anatomy. Carotid stenosis measurements (when applicable) are obtained utilizing NASCET criteria, using the distal internal carotid diameter as the denominator. CONTRAST:  90mL OMNIPAQUE IOHEXOL 350 MG/ML SOLN COMPARISON:  Brain MRI 0832 hours today. Intracranial MRA 05/16/2016. FINDINGS: CTA NECK Skeleton: Partially absent dentition, no acute dental finding identified. Lower cervical disc and endplate degeneration. No acute osseous abnormality identified. Upper chest: Partially visible calcified series 5, image 170) coronary artery atherosclerosis (. Negative visible central pulmonary artery. No superior mediastinal lymphadenopathy. Negative lung apices. Other neck: Surgically absent thyroid. No lymphadenopathy or acute finding in the neck. Aortic arch: 3 vessel  arch configuration.  No arch atherosclerosis. Right carotid system: Tortuous proximal right CCA. Negative right carotid bifurcation. Mildly tortuous right ICA just below the skull. No plaque or stenosis. Left carotid system: Mildly tortuous left carotid system similar to that on the right. Minimal plaque at the left ICA origin and bulb. No stenosis. Vertebral arteries: Proximal right subclavian artery appears normal. There is mild stenosis of the proximal right vertebral artery from its origin (series 8, image 168). Tortuous right V1 and V2 segments. But patent right vertebral to the skull base without additional stenosis. Normal proximal left subclavian artery and left vertebral artery origin. Tortuous left V1 and V2 segments. Patent left vertebral to the skull base without stenosis. CTA HEAD Posterior circulation: Fairly codominant distal vertebral arteries are patent to the basilar without plaque or stenosis. Normal PICA origins. Patent basilar artery, diminutive, but without focal plaque or stenosis. SCA and PCA origins are patent.  Small right posterior communicating artery. Bilateral PCA branches are patent. No focal stenosis. Anterior circulation: Both ICA siphons are patent and somewhat diminutive similar to the basilar. Minimal siphon calcified plaque mostly on the right. No discrete siphon stenosis. Normal right posterior communicating artery origin. Patent carotid termini, MCA and ACA origins. Anterior communicating artery and bilateral ACA branches are within normal limits. Left MCA M1 segment and bifurcation are patent without stenosis. Right MCA M1 segment and bifurcation are patent without stenosis. Bilateral MCA branches are within normal limits. Anterior and posterior circulation appear stable from the 2018 MRA. Venous sinuses: Patent, although effaced appearance of the bilateral transverse and sigmoid sinus junctions, perhaps related to normal arachnoid granulations. Anatomic variants: None. Review of the MIP images confirms the above findings IMPRESSION: 1. Negative for large vessel occlusion. 2. Minimal atherosclerosis in the head and neck. No significant arterial stenosis, although the Basilar and both ICA Siphons all appear somewhat diminutive. Tortuous cervical carotid arteries. 3. Chronic partially empty sella, as well as an effaced appearance at the transverse and sigmoid venous sinus junctions. These can be normal anatomic variation, but also can be associated with idiopathic intracranial hypertension (pseudotumor cerebri). 4. Partially visible calcified coronary artery atherosclerosis. 5. Surgically absent thyroid. Electronically Signed   By: Genevie Ann M.D.   On: 03/09/2020 14:36   DG Chest 2 View  Result Date: 03/09/2020 CLINICAL DATA:  L sided chest tightness EXAM: CHEST - 2 VIEW COMPARISON:  11/15/2018 and prior FINDINGS: No focal consolidation. No pneumothorax or pleural effusion. Cardiomediastinal silhouette is within normal limits. No acute osseous abnormality. IMPRESSION: No focal airspace disease.  Electronically Signed   By: Primitivo Gauze M.D.   On: 03/09/2020 06:59   CT HEAD WO CONTRAST  Result Date: 03/09/2020 CLINICAL DATA:  Numbness or tingling, paresthesia EXAM: CT HEAD WITHOUT CONTRAST TECHNIQUE: Contiguous axial images were obtained from the base of the skull through the vertex without intravenous contrast. COMPARISON:  04/30/2018 and prior FINDINGS: Brain: No acute infarct or intracranial hemorrhage. No mass lesion. No midline shift, ventriculomegaly or extra-axial fluid collection. Mild chronic microvascular ischemic changes. Vascular: No hyperdense vessel or unexpected calcification. Skull: No acute finding. Sinuses/Orbits: Normal orbits. Clear paranasal sinuses. No mastoid effusion. Other: None. IMPRESSION: No acute intracranial process. Mild chronic microvascular ischemic changes. Electronically Signed   By: Primitivo Gauze M.D.   On: 03/09/2020 06:53   CT Angio Neck W and/or Wo Contrast  Result Date: 03/09/2020 CLINICAL DATA:  59 year old female with neurologic deficit, TIA. Persistent left face numbness. EXAM: CT ANGIOGRAPHY HEAD AND NECK TECHNIQUE:  Multidetector CT imaging of the head and neck was performed using the standard protocol during bolus administration of intravenous contrast. Multiplanar CT image reconstructions and MIPs were obtained to evaluate the vascular anatomy. Carotid stenosis measurements (when applicable) are obtained utilizing NASCET criteria, using the distal internal carotid diameter as the denominator. CONTRAST:  45mL OMNIPAQUE IOHEXOL 350 MG/ML SOLN COMPARISON:  Brain MRI 0832 hours today. Intracranial MRA 05/16/2016. FINDINGS: CTA NECK Skeleton: Partially absent dentition, no acute dental finding identified. Lower cervical disc and endplate degeneration. No acute osseous abnormality identified. Upper chest: Partially visible calcified series 5, image 170) coronary artery atherosclerosis (. Negative visible central pulmonary artery. No superior  mediastinal lymphadenopathy. Negative lung apices. Other neck: Surgically absent thyroid. No lymphadenopathy or acute finding in the neck. Aortic arch: 3 vessel arch configuration.  No arch atherosclerosis. Right carotid system: Tortuous proximal right CCA. Negative right carotid bifurcation. Mildly tortuous right ICA just below the skull. No plaque or stenosis. Left carotid system: Mildly tortuous left carotid system similar to that on the right. Minimal plaque at the left ICA origin and bulb. No stenosis. Vertebral arteries: Proximal right subclavian artery appears normal. There is mild stenosis of the proximal right vertebral artery from its origin (series 8, image 168). Tortuous right V1 and V2 segments. But patent right vertebral to the skull base without additional stenosis. Normal proximal left subclavian artery and left vertebral artery origin. Tortuous left V1 and V2 segments. Patent left vertebral to the skull base without stenosis. CTA HEAD Posterior circulation: Fairly codominant distal vertebral arteries are patent to the basilar without plaque or stenosis. Normal PICA origins. Patent basilar artery, diminutive, but without focal plaque or stenosis. SCA and PCA origins are patent. Small right posterior communicating artery. Bilateral PCA branches are patent. No focal stenosis. Anterior circulation: Both ICA siphons are patent and somewhat diminutive similar to the basilar. Minimal siphon calcified plaque mostly on the right. No discrete siphon stenosis. Normal right posterior communicating artery origin. Patent carotid termini, MCA and ACA origins. Anterior communicating artery and bilateral ACA branches are within normal limits. Left MCA M1 segment and bifurcation are patent without stenosis. Right MCA M1 segment and bifurcation are patent without stenosis. Bilateral MCA branches are within normal limits. Anterior and posterior circulation appear stable from the 2018 MRA. Venous sinuses: Patent,  although effaced appearance of the bilateral transverse and sigmoid sinus junctions, perhaps related to normal arachnoid granulations. Anatomic variants: None. Review of the MIP images confirms the above findings IMPRESSION: 1. Negative for large vessel occlusion. 2. Minimal atherosclerosis in the head and neck. No significant arterial stenosis, although the Basilar and both ICA Siphons all appear somewhat diminutive. Tortuous cervical carotid arteries. 3. Chronic partially empty sella, as well as an effaced appearance at the transverse and sigmoid venous sinus junctions. These can be normal anatomic variation, but also can be associated with idiopathic intracranial hypertension (pseudotumor cerebri). 4. Partially visible calcified coronary artery atherosclerosis. 5. Surgically absent thyroid. Electronically Signed   By: Genevie Ann M.D.   On: 03/09/2020 14:36   MR BRAIN WO CONTRAST  Result Date: 03/09/2020 CLINICAL DATA:  Neuro deficit, acute, stroke suspected EXAM: MRI HEAD WITHOUT CONTRAST TECHNIQUE: Multiplanar, multiecho pulse sequences of the brain and surrounding structures were obtained without intravenous contrast. COMPARISON:  03/09/2020 and prior. FINDINGS: Brain: No diffusion-weighted signal abnormality. No intracranial hemorrhage. No midline shift, ventriculomegaly or extra-axial fluid collection. No mass lesion. Mild chronic microvascular ischemic changes. Vascular: Normal flow voids. Skull and upper cervical spine:  Normal marrow signal. Sinuses/Orbits: Normal orbits. Clear paranasal sinuses. No mastoid effusion. Other: None. IMPRESSION: No acute intracranial process. Mild chronic microvascular ischemic changes. Electronically Signed   By: Primitivo Gauze M.D.   On: 03/09/2020 09:28   ECHOCARDIOGRAM COMPLETE  Result Date: 03/10/2020    ECHOCARDIOGRAM REPORT   Patient Name:   [name] Date of Exam: 03/10/2020 Medical Rec #:  [id number]       Height:       64.0 in Accession #:     [id number]      Weight:       250.0 lb Date of Birth:  May 25, 1960      BSA:          2.151 m Patient Age:    15 years        BP:           124/74 mmHg Patient Gender: F               HR:           73 bpm. Exam Location:  Inpatient Procedure: 2D Echo, 3D Echo, Color Doppler and Cardiac Doppler Indications:    TIA 435.9 / G45.9  History:        Patient has prior history of Echocardiogram examinations, most                 recent 04/30/2018. Signs/Symptoms:Murmur; Risk                 Factors:Hypertension and Diabetes. Thyroid disease. History of                 cancer.  Sonographer:    Darlina Sicilian RDCS Referring Phys: [id number] [place]  Sonographer Comments: Global longitudinal strain was attempted. IMPRESSIONS  1. Left ventricular ejection fraction, by estimation, is 60 to 65%. The left ventricle has normal function. The left ventricle has no regional wall motion abnormalities. There is mild left ventricular hypertrophy. Left ventricular diastolic parameters are consistent with Grade II diastolic dysfunction (pseudonormalization).  2. Right ventricular systolic function is normal. The right ventricular size is normal. There is normal pulmonary artery systolic pressure. The estimated right ventricular systolic pressure is 00.8 mmHg.  3. The mitral valve is normal in structure. No evidence of mitral valve regurgitation. No evidence of mitral stenosis.  4. The aortic valve is tricuspid. Aortic valve regurgitation is not visualized. No aortic stenosis is present.  5. Aortic dilatation noted. There is mild dilatation of the ascending aorta, measuring 40 mm.  6. The inferior vena cava is normal in size with greater than 50% respiratory variability, suggesting right atrial pressure of 3 mmHg. FINDINGS  Left Ventricle: Left ventricular ejection fraction, by estimation, is 60 to 65%. The left ventricle has normal function. The left ventricle has no regional wall motion abnormalities. The left ventricular  internal cavity size was normal in size. There is  mild left ventricular hypertrophy. Left ventricular diastolic parameters are consistent with Grade II diastolic dysfunction (pseudonormalization). Right Ventricle: The right ventricular size is normal. No increase in right ventricular wall thickness. Right ventricular systolic function is normal. There is normal pulmonary artery systolic pressure. The tricuspid regurgitant velocity is 2.31 m/s, and  with an assumed right atrial pressure of 3 mmHg, the estimated right ventricular systolic pressure is 67.6 mmHg. Left Atrium: Left atrial size was normal in size. Right Atrium: Right atrial size was normal in size. Pericardium: There is no evidence of pericardial effusion. Mitral Valve:  The mitral valve is normal in structure. No evidence of mitral valve regurgitation. No evidence of mitral valve stenosis. Tricuspid Valve: The tricuspid valve is normal in structure. Tricuspid valve regurgitation is trivial. Aortic Valve: The aortic valve is tricuspid. Aortic valve regurgitation is not visualized. No aortic stenosis is present. Aortic valve mean gradient measures 4.0 mmHg. Aortic valve peak gradient measures 7.6 mmHg. Aortic valve area, by VTI measures 1.96 cm. Pulmonic Valve: The pulmonic valve was normal in structure. Pulmonic valve regurgitation is trivial. Aorta: The aortic root is normal in size and structure and aortic dilatation noted. There is mild dilatation of the ascending aorta, measuring 40 mm. Venous: The inferior vena cava is normal in size with greater than 50% respiratory variability, suggesting right atrial pressure of 3 mmHg. IAS/Shunts: No atrial level shunt detected by color flow Doppler.  LEFT VENTRICLE PLAX 2D LVIDd:         4.60 cm  Diastology LVIDs:         3.00 cm  LV e' medial:    5.08 cm/s LV PW:         1.00 cm  LV E/e' medial:  20.5 LV IVS:        1.00 cm  LV e' lateral:   8.06 cm/s LVOT diam:     2.00 cm  LV E/e' lateral: 12.9 LV SV:          58 LV SV Index:   27 LVOT Area:     3.14 cm                          3D Volume EF:                         3D EF:        55 %                         LV EDV:       115 ml                         LV ESV:       52 ml                         LV SV:        64 ml RIGHT VENTRICLE RV S prime:     11.50 cm/s TAPSE (M-mode): 1.7 cm LEFT ATRIUM             Index       RIGHT ATRIUM          Index LA diam:        4.50 cm 2.09 cm/m  RA Area:     8.85 cm LA Vol (A2C):   36.9 ml 17.15 ml/m RA Volume:   11.80 ml 5.49 ml/m LA Vol (A4C):   47.2 ml 21.94 ml/m LA Biplane Vol: 41.0 ml 19.06 ml/m  AORTIC VALVE AV Area (Vmax):    1.95 cm AV Area (Vmean):   1.92 cm AV Area (VTI):     1.96 cm AV Vmax:           138.00 cm/s AV Vmean:          98.200 cm/s AV VTI:            0.298 m AV Peak Grad:  7.6 mmHg AV Mean Grad:      4.0 mmHg LVOT Vmax:         85.70 cm/s LVOT Vmean:        60.100 cm/s LVOT VTI:          0.186 m LVOT/AV VTI ratio: 0.62  AORTA Ao Root diam: 2.90 cm Ao Asc diam:  3.80 cm MITRAL VALVE                TRICUSPID VALVE MV Area (PHT): 4.31 cm     TR Peak grad:   21.3 mmHg MV Decel Time: 176 msec     TR Vmax:        231.00 cm/s MV E velocity: 104.00 cm/s MV A velocity: 91.20 cm/s   SHUNTS MV E/A ratio:  1.14         Systemic VTI:  0.19 m                             Systemic Diam: 2.00 cm Loralie Champagne MD Electronically signed by Loralie Champagne MD Signature Date/Time: 03/10/2020/1:41:34 PM    Final       Subjective: Patient seen and examined bedside, resting comfortably.  Symptoms continue to be resolved.  No further chest pain since initial presentation.  No other complaints or concerns at this time.  Started on aspirin.  Neurology without any further recommendations.  Patient denies headache, no visual changes, no chest pain, no palpitations, no shortness of breath, no abdominal pain, no weakness, no fatigue, no cough/congestion, no paresthesias.  No acute events overnight per nursing staff.  Discharge  Exam: Vitals:   03/10/20 0440 03/10/20 1219  BP: 124/74 140/88  Pulse: 73 66  Resp: 15 16  Temp: 98.8 F (37.1 C) 98.1 F (36.7 C)  SpO2: 95% 100%   Vitals:   03/09/20 2147 03/09/20 2340 03/10/20 0440 03/10/20 1219  BP: 119/68 127/69 124/74 140/88  Pulse: 71 76 73 66  Resp: 15 17 15 16   Temp: 97.7 F (36.5 C)  98.8 F (37.1 C) 98.1 F (36.7 C)  TempSrc: Oral Oral Oral Oral  SpO2: 97% 96% 95% 100%  Weight:      Height:        General: Pt is alert, awake, not in acute distress Cardiovascular: RRR, S1/S2 +, no rubs, no gallops Respiratory: CTA bilaterally, no wheezing, no rhonchi Abdominal: Soft, NT, ND, bowel sounds + Extremities: no edema, no cyanosis    The results of significant diagnostics from this hospitalization (including imaging, microbiology, ancillary and laboratory) are listed below for reference.     Microbiology: Recent Results (from the past 240 hour(s))  Resp Panel by RT-PCR (Flu A&B, Covid) Nasopharyngeal Swab     Status: None   Collection Time: 03/09/20 12:34 PM   Specimen: Nasopharyngeal Swab; Nasopharyngeal(NP) swabs in vial transport medium  Result Value Ref Range Status   SARS Coronavirus 2 by RT PCR NEGATIVE NEGATIVE Final    Comment: (NOTE) SARS-CoV-2 target nucleic acids are NOT DETECTED.  The SARS-CoV-2 RNA is generally detectable in upper respiratory specimens during the acute phase of infection. The lowest concentration of SARS-CoV-2 viral copies this assay can detect is 138 copies/mL. A negative result does not preclude SARS-Cov-2 infection and should not be used as the sole basis for treatment or other patient management decisions. A negative result may occur with  improper specimen collection/handling, submission of specimen other than nasopharyngeal swab, presence of viral  mutation(s) within the areas targeted by this assay, and inadequate number of viral copies(<138 copies/mL). A negative result must be combined with clinical  observations, patient history, and epidemiological information. The expected result is Negative.  Fact Sheet for Patients:  EntrepreneurPulse.com.au  Fact Sheet for Healthcare Providers:  IncredibleEmployment.be  This test is no t yet approved or cleared by the [place] FDA and  has been authorized for detection and/or diagnosis of SARS-CoV-2 by FDA under an Emergency Use Authorization (EUA). This EUA will remain  in effect (meaning this test can be used) for the duration of the COVID-19 declaration under Section 564(b)(1) of the Act, 21 U.S.C.section 360bbb-3(b)(1), unless the authorization is terminated  or revoked sooner.       Influenza A by PCR NEGATIVE NEGATIVE Final   Influenza B by PCR NEGATIVE NEGATIVE Final    Comment: (NOTE) The Xpert Xpress SARS-CoV-2/FLU/RSV plus assay is intended as an aid in the diagnosis of influenza from Nasopharyngeal swab specimens and should not be used as a sole basis for treatment. Nasal washings and aspirates are unacceptable for Xpert Xpress SARS-CoV-2/FLU/RSV testing.  Fact Sheet for Patients: EntrepreneurPulse.com.au  Fact Sheet for Healthcare Providers: IncredibleEmployment.be  This test is not yet approved or cleared by the [place] FDA and has been authorized for detection and/or diagnosis of SARS-CoV-2 by FDA under an Emergency Use Authorization (EUA). This EUA will remain in effect (meaning this test can be used) for the duration of the COVID-19 declaration under Section 564(b)(1) of the Act, 21 U.S.C. section 360bbb-3(b)(1), unless the authorization is terminated or revoked.  Performed at [facility], [place] [address], [place], [place] [address]      Labs: BNP (last 3 results) No results for input(s): BNP in the last 8760 hours. Basic Metabolic Panel: Recent Labs  Lab 03/09/20 0556 03/09/20 0609  NA 139 142  K 3.7 3.6  CL 104  105  CO2 22  --   GLUCOSE 216* 211*  BUN 11 12  CREATININE 1.13* 1.10*  CALCIUM 8.1*  --    Liver Function Tests: Recent Labs  Lab 03/09/20 0556  AST 21  ALT 22  ALKPHOS 57  BILITOT 0.5  PROT 6.8  ALBUMIN 3.6   No results for input(s): LIPASE, AMYLASE in the last 168 hours. No results for input(s): AMMONIA in the last 168 hours. CBC: Recent Labs  Lab 03/09/20 0556 03/09/20 0609  WBC 7.0  --   NEUTROABS 3.8  --   HGB 12.7 12.9  HCT 40.6 38.0  MCV 93.8  --   PLT 277  --    Cardiac Enzymes: No results for input(s): CKTOTAL, CKMB, CKMBINDEX, TROPONINI in the last 168 hours. BNP: Invalid input(s): POCBNP CBG: Recent Labs  Lab 03/09/20 0543 03/09/20 1555 03/09/20 2035 03/10/20 0552 03/10/20 1103  GLUCAP 205* 136* 224* 172* 227*   D-Dimer No results for input(s): DDIMER in the last 72 hours. Hgb A1c Recent Labs    03/09/20 1604 03/10/20 0306  HGBA1C 8.3* 8.4*   Lipid Profile Recent Labs    03/10/20 0306  CHOL 160  HDL 37*  LDLCALC 54  TRIG 345*  CHOLHDL 4.3   Thyroid function studies No results for input(s): TSH, T4TOTAL, T3FREE, THYROIDAB in the last 72 hours.  Invalid input(s): FREET3 Anemia work up No results for input(s): VITAMINB12, FOLATE, FERRITIN, TIBC, IRON, RETICCTPCT in the last 72 hours. Urinalysis    Component Value Date/Time   COLORURINE YELLOW 03/09/2020 0610   APPEARANCEUR  CLEAR 03/09/2020 0610   LABSPEC 1.009 03/09/2020 0610   PHURINE 5.0 03/09/2020 0610   GLUCOSEU NEGATIVE 03/09/2020 0610   HGBUR NEGATIVE 03/09/2020 0610   BILIRUBINUR NEGATIVE 03/09/2020 0610   KETONESUR NEGATIVE 03/09/2020 0610   PROTEINUR NEGATIVE 03/09/2020 0610   UROBILINOGEN 0.2 10/07/2014 1009   NITRITE NEGATIVE 03/09/2020 0610   LEUKOCYTESUR NEGATIVE 03/09/2020 0610   Sepsis Labs Invalid input(s): PROCALCITONIN,  WBC,  LACTICIDVEN Microbiology Recent Results (from the past 240 hour(s))  Resp Panel by RT-PCR (Flu A&B, Covid) Nasopharyngeal  Swab     Status: None   Collection Time: 03/09/20 12:34 PM   Specimen: Nasopharyngeal Swab; Nasopharyngeal(NP) swabs in vial transport medium  Result Value Ref Range Status   SARS Coronavirus 2 by RT PCR NEGATIVE NEGATIVE Final    Comment: (NOTE) SARS-CoV-2 target nucleic acids are NOT DETECTED.  The SARS-CoV-2 RNA is generally detectable in upper respiratory specimens during the acute phase of infection. The lowest concentration of SARS-CoV-2 viral copies this assay can detect is 138 copies/mL. A negative result does not preclude SARS-Cov-2 infection and should not be used as the sole basis for treatment or other patient management decisions. A negative result may occur with  improper specimen collection/handling, submission of specimen other than nasopharyngeal swab, presence of viral mutation(s) within the areas targeted by this assay, and inadequate number of viral copies(<138 copies/mL). A negative result must be combined with clinical observations, patient history, and epidemiological information. The expected result is Negative.  Fact Sheet for Patients:  EntrepreneurPulse.com.au  Fact Sheet for Healthcare Providers:  IncredibleEmployment.be  This test is no t yet approved or cleared by the [place] FDA and  has been authorized for detection and/or diagnosis of SARS-CoV-2 by FDA under an Emergency Use Authorization (EUA). This EUA will remain  in effect (meaning this test can be used) for the duration of the COVID-19 declaration under Section 564(b)(1) of the Act, 21 U.S.C.section 360bbb-3(b)(1), unless the authorization is terminated  or revoked sooner.       Influenza A by PCR NEGATIVE NEGATIVE Final   Influenza B by PCR NEGATIVE NEGATIVE Final    Comment: (NOTE) The Xpert Xpress SARS-CoV-2/FLU/RSV plus assay is intended as an aid in the diagnosis of influenza from Nasopharyngeal swab specimens and should not be used as a sole  basis for treatment. Nasal washings and aspirates are unacceptable for Xpert Xpress SARS-CoV-2/FLU/RSV testing.  Fact Sheet for Patients: EntrepreneurPulse.com.au  Fact Sheet for Healthcare Providers: IncredibleEmployment.be  This test is not yet approved or cleared by the [place] FDA and has been authorized for detection and/or diagnosis of SARS-CoV-2 by FDA under an Emergency Use Authorization (EUA). This EUA will remain in effect (meaning this test can be used) for the duration of the COVID-19 declaration under Section 564(b)(1) of the Act, 21 U.S.C. section 360bbb-3(b)(1), unless the authorization is terminated or revoked.  Performed at [facility], [place] [address] [place], [place] [address]      Time coordinating discharge: Over 30 minutes  SIGNED:   Rande Dario J [place] DO  [facility] 03/10/2020, 2:13 PM

## 2020-03-10 NOTE — Progress Notes (Signed)
  Echocardiogram 2D Echocardiogram with 3D has been performed.  [name] M 03/10/2020, 8:55 AM

## 2020-03-10 NOTE — Discharge Instructions (Signed)
Transient Ischemic Attack  A transient ischemic attack (TIA) is a "warning stroke" that causes stroke-like symptoms that go away quickly. A TIA does not cause lasting damage to the brain. But having a TIA is a sign that you may be at risk for a stroke. Lifestyle changes and medical treatments can help prevent a stroke. It is important to know the symptoms of a TIA and what to do. Get help right away, even if your symptoms go away. The symptoms of a TIA are the same as those of a stroke. They can happen fast, and they usually go away within minutes or hours. They can include:  Weakness or loss of feeling in your face, arm, or leg. This often happens on one side of your body.  Trouble walking.  Trouble moving your arms or legs.  Trouble talking or understanding what people are saying.  Trouble seeing.  Seeing two of one object (double vision).  Feeling dizzy.  Feeling confused.  Loss of balance or coordination.  Feeling sick to your stomach (nauseous) and throwing up (vomiting).  A very bad headache for no reason. What increases the risk? Certain things may make you more likely to have a TIA. Some of these are things that you can change, such as:  Being very overweight (obese).  Using products that contain nicotine or tobacco, such as cigarettes and e-cigarettes.  Taking birth control pills.  Not being active.  Drinking too much alcohol.  Using drugs. Other risk factors include:  Having an irregular heartbeat (atrial fibrillation).  Being African American or Hispanic.  Having had blood clots, stroke, TIA, or heart attack in the past.  Being a woman with a history of high blood pressure in pregnancy (preeclampsia).  Being over the age of 60.  Being female.  Having family history of stroke.  Having the following diseases or conditions: ? High blood pressure. ? High cholesterol. ? Diabetes. ? Heart disease. ? Sickle cell disease. ? Sleep apnea. ? Migraine  headache. ? Long-term (chronic) diseases that cause soreness and swelling (inflammation). ? Disorders that affect how your blood clots. Follow these instructions at home: Medicines   Take over-the-counter and prescription medicines only as told by your doctor.  If you were told to take aspirin or another medicine to thin your blood, take it exactly as told by your doctor. ? Taking too much of the medicine can cause bleeding. ? Taking too little of the medicine may not work to treat the problem. Eating and drinking   Eat 5 or more servings of fruits and vegetables each day.  Follow instructions from your doctor about your diet. You may need to follow a certain diet to help lower your risk of having a stroke. You may need to: ? Eat a diet that is low in fat and salt. ? Eat foods that contain a lot of fiber. ? Limit the amount of carbohydrates and sugar in your diet.  Limit alcohol intake to 1 drink a day for nonpregnant women and 2 drinks a day for men. One drink equals 12 oz of beer, 5 oz of wine, or 1 oz of hard liquor. General instructions  Keep a healthy weight.  Stay active. Try to get at least 30 minutes of activity on all or most days.  Find out if you have a condition called sleep apnea. Get treatment if needed.  Do not use any products that contain nicotine or tobacco, such as cigarettes and e-cigarettes. If you need help quitting,   ask your doctor.  Do not abuse drugs.  Keep all follow-up visits as told by your doctor. This is important. Get help right away if:  You have any signs of stroke. "BE FAST" is an easy way to remember the main warning signs: ? B - Balance. Signs are dizziness, sudden trouble walking, or loss of balance. ? E - Eyes. Signs are trouble seeing or a sudden change in how you see. ? F - Face. Signs are sudden weakness or loss of feeling of the face, or the face or eyelid drooping on one side. ? A - Arms. Signs are weakness or loss of feeling in an  arm. This happens suddenly and usually on one side of the body. ? S - Speech. Signs are sudden trouble speaking, slurred speech, or trouble understanding what people say. ? T - Time. Time to call emergency services. Write down what time symptoms started.  You have other signs of stroke, such as: ? A sudden, very bad headache with no known cause. ? Feeling sick to your stomach (nausea). ? Throwing up (vomiting). ? Jerky movements that you cannot control (seizure). These symptoms may be an emergency. Do not wait to see if the symptoms will go away. Get medical help right away. Call your local emergency services (911 in the [place] Do not drive yourself to the hospital. Summary  A transient ischemic attack (TIA) is a "warning stroke" that causes stroke-like symptoms that go away quickly.  A TIA is a medical emergency. Get help right away, even if your symptoms go away.  A TIA does not cause lasting damage to the brain.  Having a TIA is a sign that you may be at risk for a stroke. Lifestyle changes and medical treatments can help prevent a stroke. This information is not intended to replace advice given to you by your health care provider. Make sure you discuss any questions you have with your health care provider. Document Revised: 12/09/2017 Document Reviewed: 06/16/2016 Elsevier Patient Education  2020 Elsevier Inc.  

## 2020-03-10 NOTE — Progress Notes (Signed)
PT Cancellation Note  Patient Details Name: [name] MRN: [id number] DOB: 30-Jul-1960   Cancelled Treatment:    Reason Eval/Treat Not Completed: PT screened, no needs identified.  Discussed pt case with OT who reports pt is at baseline of function, and able to mobilize with independence (no AD). Physical therapy will sign off at this time. If needs change, please reconsult.    Thelma Comp 03/10/2020, 9:24 AM   Rolinda Roan, PT, DPT Acute Rehabilitation Services Pager: [phone] Office: [phone]

## 2020-03-10 NOTE — Progress Notes (Signed)
RN gave pt discharge instructions and the patient stated understanding. IV has been removed and the patient is getting dressed. 1 new medication escribed to pt's home pharmacy.

## 2020-03-10 NOTE — Consult Note (Signed)
Neurology Consult H&P  CC: TIA  History is obtained from: patient and chart  HPI: [name] is a 59 y.o. female PMHx previous history of CVA, renal cell CA, HTN, DM 2 and last normal previous night woke ~0415 on 03/09/2020 to go the bathroom and realized her left face and arm were numb. She also noted some tightness in her left chest. Her husband who drove her to the ED where he also noted left facial droop. Her symptoms lasted about 1 hour and on evaluation had completely resolved. Though she had returned to her baseline, she was transferred for TIA evaluation  Patient denies any previous chest pain other than the episode that occurred today.  Notes it was not exertional.  Describes it as a chest pressure.  No associated shortness of breath, nausea or vomiting.  No diaphoresis.  Chest discomfort resolved on its own.  ED Course:  The patient was worked up for TIA given resolution of symptoms.  MRI of the brain was negative for any CVA.  Patient was discussed with Dr. Leonel Ramsay of neurology who recommended admission for further work-up given risk factors for stroke.  He specifically recommended CT angiogram of head and neck prior to discharge.   LKW: previous night woke tpa given?: No, TIA IR Thrombectomy? No, LVO Modified Rankin Scale: 0-Completely asymptomatic and back to baseline post- stroke NIHSS: 0  ROS: A complete ROS was performed and is negative except as noted in the HPI.   Past Medical History:  Diagnosis Date  . Anemia    hx of   . 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 Asthma    due to allergies   . 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 Complication of anesthesia   . Diabetes mellitus    TYPE 2  . Dyspnea    increased exertion   . Dysrhythmia    " irregular heart rate per patient"  . Family history of adverse reaction to anesthesia    difficult for father to wake after surgery, AFTHER LOW BP WITH  ANESTHESIA  . Heart murmur   . HOH (hard of hearing)   . Hypercholesteremia   . Hypertension   . PONV (postoperative nausea and vomiting)   . Renal cancer, right ([place]) dx'd 07/2018, 10/2018   ablation x 2   . Thyroid disease   . TIA (transient ischemic attack)    LAST TIA  APRIL 2020 ON PLAVIX, reports residual effects   Family History  Problem Relation Age of Onset  . Heart failure Mother   . Diabetes Mother   . Stroke Father   . Heart failure Father   . Cancer Father     Social History:  reports that she has never smoked. She has never used smokeless tobacco. She reports that she does not drink alcohol and does not use drugs.   Prior to Admission medications   Medication Sig Start Date End Date Taking? Authorizing Provider  acetaminophen (TYLENOL) 500 MG tablet Take 2 tablets (1,000 mg total) by mouth every 6 (six) hours as needed (body aches). Patient taking differently: Take 1,000 mg by mouth every 4 (four) hours as needed for moderate pain or headache (body aches). 06/29/17  Yes Vann, Jessica U, DO  albuterol (PROVENTIL HFA;VENTOLIN HFA) 108 (90 Base) MCG/ACT inhaler Inhale 1-2 puffs into the lungs every 6 (six) hours as needed for wheezing or shortness  of breath. 12/28/17  Yes Jean Rosenthal, MD  b complex vitamins tablet Take 1 tablet by mouth daily.   Yes [provider]  calcium carbonate (OSCAL) 1500 (600 Ca) MG TABS tablet Take by mouth 2 (two) times daily with a meal. MAGNESIUM AND ZINC 2 IN AM 2 AT 1200 PM 2 AT DINNERTIME   Yes [provider]  clopidogrel (PLAVIX) 75 MG tablet Take 1 tablet (75 mg total) by mouth daily. 09/15/15  Yes Florencia Reasons, MD  diphenhydrAMINE (BENADRYL) 25 MG tablet Take 50 mg by mouth at bedtime.    Yes [provider]  fluticasone (FLONASE) 50 MCG/ACT nasal spray Place 2 sprays into both nostrils daily as needed for allergies. 03/04/20  Yes [provider]  furosemide (LASIX) 40 MG tablet Take 2 tablets (80 mg total)  by mouth 2 (two) times daily. 05/17/16  Yes Vann, Jessica U, DO  ibuprofen (ADVIL) 200 MG tablet Take 800 mg by mouth every 8 (eight) hours as needed for headache or moderate pain.   Yes [provider]  JANUMET 50-1000 MG tablet Take 1 tablet by mouth 2 (two) times daily. 09/16/17  Yes [provider]  levothyroxine (SYNTHROID, LEVOTHROID) 300 MCG tablet Take 300 mcg by mouth daily before breakfast.   Yes [provider]  lisinopril (PRINIVIL,ZESTRIL) 10 MG tablet Take 10 mg by mouth daily.  09/09/14  Yes [provider]  metoprolol tartrate (LOPRESSOR) 100 MG tablet Take 100 mg by mouth 2 (two) times daily. 06/23/16  Yes [provider]  Multiple Vitamin (MULTIVITAMIN PO) Take 1 tablet by mouth daily.   Yes [provider]  omeprazole (PRILOSEC OTC) 20 MG tablet Take 20 mg by mouth daily.   Yes [provider]  ondansetron (ZOFRAN) 8 MG tablet Take 8 mg by mouth every 8 (eight) hours as needed for nausea or vomiting.   Yes [provider]  potassium chloride SA (K-DUR,KLOR-CON) 20 MEQ tablet Take 40 mEq by mouth 2 (two) times daily.   Yes [provider]  pravastatin (PRAVACHOL) 20 MG tablet Take 20 mg by mouth daily.  06/25/13  Yes [provider]  Probiotic Product (PROBIOTIC PO) Take 1 tablet by mouth daily.   Yes [provider]  zinc gluconate 50 MG tablet Take 50 mg by mouth daily.   Yes [provider]    Exam: Current vital signs: BP 127/69 (BP Location: Right Arm)   Pulse 76   Temp 97.7 F (36.5 C) (Oral)   Resp 17   Ht 5\' 4"  (1.626 m)   Wt 113.4 kg   SpO2 96%   BMI 42.91 kg/m   Physical Exam  Constitutional: Appears well-developed and well-nourished.  Psych: Affect appropriate to situation Eyes: No scleral injection HENT: No OP obstrucion Head: Normocephalic.  Cardiovascular: Normal rate and regular rhythm.  Respiratory: Effort normal and breath sounds normal to  anterior ascultation GI: Soft.  No distension. There is no tenderness.  Skin: WDI  Neuro: Mental Status: Patient is awake, alert, oriented to person, place, month, year, and situation. Patient is able to give a clear and coherent history. No signs of aphasia or neglect. Cranial Nerves: II: Visual Fields are full. Pupils are equal, round, and reactive to light. III,IV, VI: EOMI without ptosis or diploplia.  V: Facial sensation is symmetric to temperature VII: Facial movement is symmetric.  VIII: hearing is intact to voice X: Uvula elevates symmetrically XI: Shoulder shrug is symmetric. XII: tongue is midline  without atrophy or fasciculations.  Motor: Tone is normal. Bulk is normal. 5/5 strength was present in all four extremities. Sensory: Sensation is symmetric to light touch and temperature in the arms and legs. Deep Tendon Reflexes: 2+ and symmetric in the biceps and patellae. Plantars: Toes are downgoing bilaterally. Cerebellar: FNF and HKS are intact bilaterally.  I have reviewed labs in epic and the pertinent results are:   Ref. Range 03/09/2020 20:35  Glucose-Capillary Latest Ref Range: 70 - 99 mg/dL 224 (H)    Ref. Range 03/09/2020 05:56  Sodium Latest Ref Range: 135 - 145 mmol/L 139  Potassium Latest Ref Range: 3.5 - 5.1 mmol/L 3.7  Chloride Latest Ref Range: 98 - 111 mmol/L 104  CO2 Latest Ref Range: 22 - 32 mmol/L 22    Ref. Range 03/09/2020 05:56  Creatinine Latest Ref Range: 0.44 - 1.00 mg/dL 1.13 (H)  Calcium Latest Ref Range: 8.9 - 10.3 mg/dL 8.1 (L)  Anion gap Latest Ref Range: 5 - 15  13    Ref. Range 03/09/2020 16:04  Hemoglobin A1C Latest Ref Range: 4.8 - 5.6 % 8.3 (H)    I have reviewed the images obtained: MRI brain showed no acute intracranial process, hemorrhage or mass lesion. Mild microangiopathic ischemic disease.   Assessment: AMAREA MACDOWELL is a 59 y.o. female PMHx HTN, DM2, HLD, history of stroke with transient left sided numbness  which resolved. As she has vascular risk factors and history of previous stroke recommend TIA workup.  Impression:  TIA - Left numbness. HTN HLD Hypocalcemia Gadolinium contrast allergy   Plan: - CTA head and neck. - Recommend TTE. - Recommend Statin if LDL > 70 - Continue aspirin 81mg  daily. - Continue clopidogrel 75mg  daily for 3 weeks. - Permissive hypertension first 24 h < 220/110.  - Telemetry monitoring for arrhythmia. - Recommend bedside Swallow screen. - Recommend Stroke education. - Recommend PT/OT/SLP consult. - Continue to correct metabolic derangement.  Electronically signed by: Dr. Lynnae Sandhoff Pager: [phone] 03/09/2020, 11:19 PM

## 2020-03-11 ENCOUNTER — Other Ambulatory Visit: Payer: Self-pay | Admitting: Neurology

## 2020-03-11 DIAGNOSIS — I499 Cardiac arrhythmia, unspecified: Secondary | ICD-10-CM

## 2020-03-11 DIAGNOSIS — G459 Transient cerebral ischemic attack, unspecified: Secondary | ICD-10-CM

## 2020-04-02 ENCOUNTER — Institutional Professional Consult (permissible substitution): Payer: Medicare Other | Admitting: Internal Medicine

## 2020-04-23 ENCOUNTER — Institutional Professional Consult (permissible substitution): Payer: Medicare Other | Admitting: Internal Medicine

## 2020-05-19 ENCOUNTER — Institutional Professional Consult (permissible substitution): Payer: Medicare Other | Admitting: Internal Medicine

## 2020-05-22 ENCOUNTER — Other Ambulatory Visit: Payer: Self-pay | Admitting: Interventional Radiology

## 2020-05-22 DIAGNOSIS — N2889 Other specified disorders of kidney and ureter: Secondary | ICD-10-CM

## 2020-06-02 ENCOUNTER — Other Ambulatory Visit: Payer: Self-pay

## 2020-06-02 ENCOUNTER — Ambulatory Visit: Payer: Medicare Other | Admitting: Diagnostic Neuroimaging

## 2020-06-02 ENCOUNTER — Encounter: Payer: Self-pay | Admitting: Internal Medicine

## 2020-06-02 ENCOUNTER — Ambulatory Visit (INDEPENDENT_AMBULATORY_CARE_PROVIDER_SITE_OTHER): Payer: Medicare Other | Admitting: Internal Medicine

## 2020-06-02 VITALS — BP 110/80 | HR 70 | Ht 64.0 in | Wt 232.4 lb

## 2020-06-02 DIAGNOSIS — G459 Transient cerebral ischemic attack, unspecified: Secondary | ICD-10-CM

## 2020-06-02 HISTORY — PX: OTHER SURGICAL HISTORY: SHX169

## 2020-06-02 NOTE — Patient Instructions (Addendum)
Medication Instructions:  Your physician recommends that you continue on your current medications as directed. Please refer to the Current Medication list given to you today.  Labwork: None ordered.  Testing/Procedures: None ordered.  Follow-Up:  Your physician wants you to follow-up in: as needed with Dr. Rayann Heman.      Implantable Loop Recorder Placement, Care After This sheet gives you information about how to care for yourself after your procedure. Your health care provider may also give you more specific instructions. If you have problems or questions, contact your health care provider. What can I expect after the procedure? After the procedure, it is common to have:  Soreness or discomfort near the incision.  Some swelling or bruising near the incision.  Follow these instructions at home: Incision care  1.  Leave your outer dressing on for 24 hours.  After 24 hours you can remove your outer dressing and shower. 2. Leave adhesive strips in place. These skin closures may need to stay in place for 1-2 weeks. If adhesive strip edges start to loosen and curl up, you may trim the loose edges.  You may remove the strips if they have not fallen off after 2 weeks. 3. Check your incision area every day for signs of infection. Check for: a. Redness, swelling, or pain. b. Fluid or blood. c. Warmth. d. Pus or a bad smell. 4. Do not take baths, swim, or use a hot tub until your incision is completely healed. 5. If your wound site starts to bleed apply pressure.      If you have any questions/concerns please call the device clinic at [phone].  Activity  Return to your normal activities.  General instructions  Follow instructions from your health care provider about how to manage your implantable loop recorder and transmit the information. Learn how to activate a recording if this is necessary for your type of device.  Do not go through a metal detection gate, and do not let  someone hold a metal detector over your chest. Show your ID card.  Do not have an MRI unless you check with your health care provider first.  Take over-the-counter and prescription medicines only as told by your health care provider.  Keep all follow-up visits as told by your health care provider. This is important. Contact a health care provider if:  You have redness, swelling, or pain around your incision.  You have a fever.  You have pain that is not relieved by your pain medicine.  You have triggered your device because of fainting (syncope) or because of a heartbeat that feels like it is racing, slow, fluttering, or skipping (palpitations). Get help right away if you have:  Chest pain.  Difficulty breathing. Summary  After the procedure, it is common to have soreness or discomfort near the incision.  Change your dressing as told by your health care provider.  Follow instructions from your health care provider about how to manage your implantable loop recorder and transmit the information.  Keep all follow-up visits as told by your health care provider. This is important. This information is not intended to replace advice given to you by your health care provider. Make sure you discuss any questions you have with your health care provider. Document Released: 02/24/2015 Document Revised: 04/30/2017 Document Reviewed: 04/30/2017 Elsevier Patient Education  2020 [employer].

## 2020-06-02 NOTE — Progress Notes (Signed)
Electrophysiology Office Note   Date:  06/02/2020   ID:  [name], DOB March 08, 1961, MRN [id number]  PCP:  Lin Landsman, MD    Primary Electrophysiologist: Thompson Grayer, MD    CC: stroke   History of Present Illness: [name] is a 60 y.o. female who presents today for electrophysiology evaluation.   She is referred by Dr Erlinda Hong for EP consultation regarding possible arrhythmogenic causes of stroke. She has a h/o recurrent stroke, most recently 12/21.  She presented 03/09/20 with L facial droop which resolved in 20 minutes.  She was felt to have a TIA.   There is concern for arrhythmia as a possible cause for her symptoms. She has previously worn short term monitors which revealed "arrhythmia too short to treat".   Dr Erlinda Hong has advised long term monitoring. Today, she denies symptoms of palpitations, chest pain, shortness of breath, orthopnea, PND, lower extremity edema, claudication, dizziness, presyncope, syncope, bleeding, or neurologic sequela. The patient is tolerating medications without difficulties and is otherwise without complaint today.    Past Medical History:  Diagnosis Date  . Anemia    hx of   . 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 Asthma    due to allergies   . Cancer ([place])    THRYOID 2016 [place] WITH SURGERY, KIDNEY CANCER MAY 2018  RIGHT RENAL CRYOABLATION  . Complication of anesthesia   . Diabetes mellitus    TYPE 2  . Dyspnea    increased exertion   . Dysrhythmia    " irregular heart rate per patient"  . Family history of adverse reaction to anesthesia    difficult for father to wake after surgery, AFTHER LOW BP WITH ANESTHESIA  . Heart murmur   . HOH (hard of hearing)   . Hypercholesteremia   . Hypertension   . PONV (postoperative nausea and vomiting)   . Renal cancer, right ([place]) dx'd 07/2018, 10/2018   ablation x 2   . Thyroid disease   . TIA (transient ischemic attack)    LAST TIA  APRIL 2020 ON PLAVIX, reports residual effects   Past Surgical  History:  Procedure Laterality Date  . ABDOMINAL HYSTERECTOMY     COMPLETE  . BIOPSY  03/02/2019   Procedure: BIOPSY;  Surgeon: Carol Ada, MD;  Location: [facility];  Service: Endoscopy;;  . CHOLECYSTECTOMY    . COLON SURGERY    . COLONOSCOPY WITH PROPOFOL N/A 03/02/2019   Procedure: COLONOSCOPY WITH PROPOFOL;  Surgeon: Carol Ada, MD;  Location: [facility];  Service: Endoscopy;  Laterality: N/A;  . ear surgeries     . IR RADIOLOGIST EVAL & MGMT  01/12/2017  . IR RADIOLOGIST EVAL & MGMT  06/16/2017  . IR RADIOLOGIST EVAL & MGMT  08/31/2017  . IR RADIOLOGIST EVAL & MGMT  12/13/2017  . IR RADIOLOGIST EVAL & MGMT  03/16/2018  . IR RADIOLOGIST EVAL & MGMT  11/01/2018  . IR RADIOLOGIST EVAL & MGMT  12/13/2018  . IR RADIOLOGIST EVAL & MGMT  02/15/2019  . IR RADIOLOGIST EVAL & MGMT  08/16/2019  . IR RADIOLOGIST EVAL & MGMT  11/22/2019  . KNEE ARTHROSCOPY    . LEFT HEART CATHETERIZATION WITH CORONARY ANGIOGRAM N/A 04/24/2014   Procedure: LEFT HEART CATHETERIZATION WITH CORONARY ANGIOGRAM;  Surgeon: Burnell Blanks, MD;  Location[facility] C[facility] LAB;  Service: Cardiovascular;  Laterality: N/A;  . POLYPECTOMY  03/02/2019   Procedure: POLYPECTOMY;  Surgeon: Carol Ada, MD;  Location:  [facility];  Service: Endoscopy;;  . RADIOFREQUENCY ABLATION Right 08/05/2017   Procedure: CT RENAL CRYO AND BIOPSY;  Surgeon: Sandi Mariscal, MD;  Location: [facility];  Service: Anesthesiology;  Laterality: Right;  . RADIOFREQUENCY ABLATION Right 11/15/2018   Procedure: RIGHT RENAL CRYOABLATION;  Surgeon: Sandi Mariscal, MD;  Location: [facility];  Service: Anesthesiology;  Laterality: Right;  . THYROIDECTOMY N/A 09/20/2014   Procedure: TOTAL THYROIDECTOMY;  Surgeon: Armandina Gemma, MD;  Location: [facility];  Service: General;  Laterality: N/A;  . TONSILLECTOMY       Current Outpatient Medications  Medication Sig Dispense Refill  . acetaminophen (TYLENOL) 500 MG tablet Take 2 tablets (1,000 mg total) by mouth every 6 (six)  hours as needed (body aches). 30 tablet 0  . albuterol (PROVENTIL HFA;VENTOLIN HFA) 108 (90 Base) MCG/ACT inhaler Inhale 1-2 puffs into the lungs every 6 (six) hours as needed for wheezing or shortness of breath. 1 Inhaler 0  . aspirin EC 81 MG EC tablet Take 1 tablet (81 mg total) by mouth daily. Swallow whole. 90 tablet 0  . b complex vitamins tablet Take 1 tablet by mouth daily.    . calcium carbonate (OSCAL) 1500 (600 Ca) MG TABS tablet Take by mouth 2 (two) times daily with a meal. MAGNESIUM AND ZINC 2 IN AM 2 AT 1200 PM 2 AT DINNERTIME    . clopidogrel (PLAVIX) 75 MG tablet Take 1 tablet (75 mg total) by mouth daily. 30 tablet 0  . diphenhydrAMINE (BENADRYL) 25 MG tablet Take 50 mg by mouth at bedtime.     . fluticasone (FLONASE) 50 MCG/ACT nasal spray Place 2 sprays into both nostrils daily as needed for allergies.    . furosemide (LASIX) 40 MG tablet Take 2 tablets (80 mg total) by mouth 2 (two) times daily.    Marland Kitchen ibuprofen (ADVIL) 200 MG tablet Take 800 mg by mouth every 8 (eight) hours as needed for headache or moderate pain.    Marland Kitchen JANUMET 50-1000 MG tablet Take 1 tablet by mouth 2 (two) times daily.  3  . levothyroxine (SYNTHROID, LEVOTHROID) 300 MCG tablet Take 300 mcg by mouth daily before breakfast.    . lisinopril (PRINIVIL,ZESTRIL) 10 MG tablet Take 10 mg by mouth daily.   5  . metoprolol tartrate (LOPRESSOR) 100 MG tablet Take 100 mg by mouth 2 (two) times daily.    . Multiple Vitamin (MULTIVITAMIN PO) Take 1 tablet by mouth daily.    Marland Kitchen omeprazole (PRILOSEC OTC) 20 MG tablet Take 20 mg by mouth daily.    . ondansetron (ZOFRAN) 8 MG tablet Take 8 mg by mouth every 8 (eight) hours as needed for nausea or vomiting.    . potassium chloride SA (K-DUR,KLOR-CON) 20 MEQ tablet Take 40 mEq by mouth 2 (two) times daily.    . pravastatin (PRAVACHOL) 20 MG tablet Take 20 mg by mouth daily.     . Probiotic Product (PROBIOTIC PO) Take 1 tablet by mouth daily.    Marland Kitchen zinc gluconate 50 MG tablet  Take 50 mg by mouth daily.     No current facility-administered medications for this visit.    Allergies:   Lorabid [loracarbef], Amoxicillin, Augmentin [amoxicillin-pot clavulanate], Codeine, Gadolinium derivatives, Sulfa drugs cross reactors, Benicar [olmesartan medoxomil], and Other   Social History:  The patient  reports that she has never smoked. She has never used smokeless tobacco. She reports that she does not drink alcohol and does not use drugs.   Family History:  The patient's  family history includes Cancer in her father; Diabetes in her mother; Heart failure in her father and mother; Stroke in her father.    ROS:  Please see the history of present illness.   All other systems are personally reviewed and negative.    PHYSICAL EXAM: VS:  BP 110/80   Pulse 70   Ht 5\' 4"  (1.626 m)   Wt 232 lb 6.4 oz (105.4 kg)   SpO2 96%   BMI 39.89 kg/m  , BMI Body mass index is 39.89 kg/m. GEN: overweight, dissheveled in no acute distress HEENT: normal Neck: no JVD, carotid bruits, or masses Cardiac: RRR; no murmurs, rubs, or gallops,no edema  Respiratory:  clear to auscultation bilaterally, normal work of breathing GI: soft, nontender, nondistended, + BS MS: no deformity or atrophy Skin: warm and dry  Neuro:  Strength and sensation are intact Psych: euthymic mood, full affect  EKG:  EKG is ordered today. The ekg ordered today is personally reviewed and shows sinus rhythm   Recent Labs: 03/09/2020: ALT 22; BUN 12; Creatinine, Ser 1.10; Hemoglobin 12.9; Platelets 277; Potassium 3.6; Sodium 142  personally reviewed   Lipid Panel     Component Value Date/Time   CHOL 160 03/10/2020 0306   TRIG 345 (H) 03/10/2020 0306   HDL 37 (L) 03/10/2020 0306   CHOLHDL 4.3 03/10/2020 0306   VLDL 69 (H) 03/10/2020 0306   LDLCALC 54 03/10/2020 0306   personally reviewed   Wt Readings from Last 3 Encounters:  06/02/20 232 lb 6.4 oz (105.4 kg)  03/09/20 250 lb (113.4 kg)  11/15/18 250  lb (113.4 kg)      Other studies personally reviewed: Additional studies/ records that were reviewed today include: prior EKGS, Dr Ericka Pontiff notes,  echo Review of the above records today demonstrates: as above   Assessment and Plan:  1. Cryptogenic stroke The patient presents with cryptogenic stroke. I spoke at length with the patient about monitoring for afib with an implantable loop recorder.  Risks, benefits, and alteratives to implantable loop recorder were discussed with the patient today.  Risks including bleeding and infection were discussed.  At this time, the patient is very clear in their decision to proceed with implantable loop recorder.   Please call with questions.      Army Fossa, MD  06/02/2020 11:34 AM     [facility] [address] [place] [address] [phone] (office) [phone] (fax)        PROCEDURES:   1. Implantable loop recorder implantation       DESCRIPTION OF PROCEDURE:  Informed written consent was obtained.  The patient required no sedation for the procedure today.  The patients left chest was prepped and draped. Mapping over the patient's chest was performed to identify the appropriate ILR site.  This area was found to be the midline inframammary region.  The skin overlying this region was infiltrated with lidocaine for local analgesia.  A 0.5-cm incision was made at the implant site.  A subcutaneous ILR pocket was fashioned using a combination of sharp and blunt dissection.  A Medtronic Reveal Linq model [id number] implantable loop recorder (SN [id number]) was then placed into the pocket R waves were very prominent and measured > 0.2 mV. EBL<1 ml.  Steri- Strips and a sterile dressing were then applied.  There were no early apparent complications.     CONCLUSIONS:   1. Successful implantation of a Medtronic Reveal LINQ implantable loop recorder for cryptogenic stroke  2. No early apparent complications.   Thompson Grayer MD, [facility] 06/02/2020 11:38 AM

## 2020-06-03 ENCOUNTER — Ambulatory Visit (HOSPITAL_COMMUNITY)
Admission: RE | Admit: 2020-06-03 | Discharge: 2020-06-03 | Disposition: A | Discharge status: Home / Self Care | Payer: Medicare Other | Source: Ambulatory Visit | Attending: Interventional Radiology | Admitting: Interventional Radiology

## 2020-06-03 ENCOUNTER — Encounter (HOSPITAL_COMMUNITY): Payer: Self-pay

## 2020-06-03 DIAGNOSIS — N2889 Other specified disorders of kidney and ureter: Secondary | ICD-10-CM | POA: Insufficient documentation

## 2020-06-03 LAB — POCT I-STAT CREATININE: Creatinine, Ser: 1 mg/dL (ref 0.44–1.00)

## 2020-06-03 MED ORDER — IOHEXOL 300 MG/ML  SOLN
100.0000 mL | Freq: Once | INTRAMUSCULAR | Status: AC | PRN
  Administered 2020-06-03: 100 mL via INTRAVENOUS

## 2020-06-05 ENCOUNTER — Encounter: Payer: Self-pay | Admitting: *Deleted

## 2020-06-05 ENCOUNTER — Ambulatory Visit
Admission: RE | Admit: 2020-06-05 | Discharge: 2020-06-05 | Disposition: A | Discharge status: Home / Self Care | Payer: Medicare Other | Source: Ambulatory Visit | Attending: Interventional Radiology | Admitting: Interventional Radiology

## 2020-06-05 DIAGNOSIS — N2889 Other specified disorders of kidney and ureter: Secondary | ICD-10-CM

## 2020-06-05 HISTORY — PX: IR RADIOLOGIST EVAL & MGMT: IMG5224

## 2020-06-05 NOTE — Progress Notes (Signed)
Patient ID: [name], female   DOB: [birth year], 60 y.o.   MRN: [id number]         Chief Complaint: Post repeat right-sided cryoablation (11/15/2018).  Referring Physician(s): Bell  History of Present Illness:  Denise Johnsonis a 60 y.o.femaleis a 60 year old female with past medical history significant for diabetes, hypertension, hyperlipidemia and thyroid cancer who initially underwent image guided right-sided renal cryoablation on 08/05/2017 of 2 right-sided renal lesions (superior pole and posterior interpolar lesions) who was found to have residual disease involving the superior pole lesion for whichshe underwentarepeat renal cryoablation on 11/15/2018.   She is seen in follow-up consultation via telemedicine following the acquisition of postprocedural CT scan performed3/8/22.  She is currently without complaint.  Past Medical History:  Diagnosis Date  . Anemia    hx of   . 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 Asthma    due to allergies   . Cancer ([place])    THRYOID 2016 [place] WITH SURGERY, KIDNEY CANCER MAY 2018  RIGHT RENAL CRYOABLATION  . Complication of anesthesia   . Diabetes mellitus    TYPE 2  . Dyspnea    increased exertion   . Dysrhythmia    " irregular heart rate per patient"  . Family history of adverse reaction to anesthesia    difficult for father to wake after surgery, AFTHER LOW BP WITH ANESTHESIA  . Heart murmur   . HOH (hard of hearing)   . Hypercholesteremia   . Hypertension   . PONV (postoperative nausea and vomiting)   . Renal cancer, right ([place]) dx'd 07/2018, 10/2018   ablation x 2   . Thyroid disease   . TIA (transient ischemic attack)    LAST TIA  APRIL 2020 ON PLAVIX, reports residual effects    Past Surgical History:  Procedure Laterality Date  . ABDOMINAL HYSTERECTOMY     COMPLETE  . BIOPSY  03/02/2019   Procedure: BIOPSY;  Surgeon: Carol Ada, MD;  Location: [facility];  Service: Endoscopy;;  . CHOLECYSTECTOMY    . COLON  SURGERY    . COLONOSCOPY WITH PROPOFOL N/A 03/02/2019   Procedure: COLONOSCOPY WITH PROPOFOL;  Surgeon: Carol Ada, MD;  Location: [facility];  Service: Endoscopy;  Laterality: N/A;  . ear surgeries     . implantable loop recorder placement  06/02/2020   Medtronic Reveal Linq model [id number] implantable loop recorder (SN [id number])   . IR RADIOLOGIST EVAL & MGMT  01/12/2017  . IR RADIOLOGIST EVAL & MGMT  06/16/2017  . IR RADIOLOGIST EVAL & MGMT  08/31/2017  . IR RADIOLOGIST EVAL & MGMT  12/13/2017  . IR RADIOLOGIST EVAL & MGMT  03/16/2018  . IR RADIOLOGIST EVAL & MGMT  11/01/2018  . IR RADIOLOGIST EVAL & MGMT  12/13/2018  . IR RADIOLOGIST EVAL & MGMT  02/15/2019  . IR RADIOLOGIST EVAL & MGMT  08/16/2019  . IR RADIOLOGIST EVAL & MGMT  11/22/2019  . KNEE ARTHROSCOPY    . LEFT HEART CATHETERIZATION WITH CORONARY ANGIOGRAM N/A 04/24/2014   Procedure: LEFT HEART CATHETERIZATION WITH CORONARY ANGIOGRAM;  Surgeon: Burnell Blanks, MD;  Location: [facility] C[facility] LAB;  Service: Cardiovascular;  Laterality: N/A;  . POLYPECTOMY  03/02/2019   Procedure: POLYPECTOMY;  Surgeon: Carol Ada, MD;  Location: [facility];  Service: Endoscopy;;  . RADIOFREQUENCY ABLATION Right 08/05/2017   Procedure: CT RENAL CRYO AND BIOPSY;  Surgeon: Sandi Mariscal, MD;  Location: [facility];  Service: Anesthesiology;  Laterality: Right;  .  RADIOFREQUENCY ABLATION Right 11/15/2018   Procedure: RIGHT RENAL CRYOABLATION;  Surgeon: Sandi Mariscal, MD;  Location: [facility];  Service: Anesthesiology;  Laterality: Right;  . THYROIDECTOMY N/A 09/20/2014   Procedure: TOTAL THYROIDECTOMY;  Surgeon: Armandina Gemma, MD;  Location: [facility];  Service: General;  Laterality: N/A;  . TONSILLECTOMY      Allergies: Lorabid [loracarbef], Amoxicillin, Augmentin [amoxicillin-pot clavulanate], Codeine, Gadolinium derivatives, Sulfa drugs cross reactors, Benicar [olmesartan medoxomil], and Other  Medications: Prior to Admission medications   Medication Sig Start  Date End Date Taking? Authorizing Provider  acetaminophen (TYLENOL) 500 MG tablet Take 2 tablets (1,000 mg total) by mouth every 6 (six) hours as needed (body aches). 06/29/17   Geradine Girt, DO  albuterol (PROVENTIL HFA;VENTOLIN HFA) 108 (90 Base) MCG/ACT inhaler Inhale 1-2 puffs into the lungs every 6 (six) hours as needed for wheezing or shortness of breath. 12/28/17   Jean Rosenthal, MD  aspirin EC 81 MG EC tablet Take 1 tablet (81 mg total) by mouth daily. Swallow whole. 03/11/20 06/09/20  [place] Eric J, DO  b complex vitamins tablet Take 1 tablet by mouth daily.    [provider]  calcium carbonate (OSCAL) 1500 (600 Ca) MG TABS tablet Take by mouth 2 (two) times daily with a meal. MAGNESIUM AND ZINC 2 IN AM 2 AT 1200 PM 2 AT DINNERTIME    [provider]  clopidogrel (PLAVIX) 75 MG tablet Take 1 tablet (75 mg total) by mouth daily. 09/15/15   Florencia Reasons, MD  diphenhydrAMINE (BENADRYL) 25 MG tablet Take 50 mg by mouth at bedtime.     [provider]  fluticasone (FLONASE) 50 MCG/ACT nasal spray Place 2 sprays into both nostrils daily as needed for allergies. 03/04/20   [provider]  furosemide (LASIX) 40 MG tablet Take 2 tablets (80 mg total) by mouth 2 (two) times daily. 05/17/16   Geradine Girt, DO  ibuprofen (ADVIL) 200 MG tablet Take 800 mg by mouth every 8 (eight) hours as needed for headache or moderate pain.    [provider]  JANUMET 50-1000 MG tablet Take 1 tablet by mouth 2 (two) times daily. 09/16/17   [provider]  levothyroxine (SYNTHROID, LEVOTHROID) 300 MCG tablet Take 300 mcg by mouth daily before breakfast.    [provider]  lisinopril (PRINIVIL,ZESTRIL) 10 MG tablet Take 10 mg by mouth daily.  09/09/14   [provider]  metoprolol tartrate (LOPRESSOR) 100 MG tablet Take 100 mg by mouth 2 (two) times daily. 06/23/16   [provider]  Multiple Vitamin (MULTIVITAMIN PO) Take 1 tablet by mouth  daily.    [provider]  omeprazole (PRILOSEC OTC) 20 MG tablet Take 20 mg by mouth daily.    [provider]  ondansetron (ZOFRAN) 8 MG tablet Take 8 mg by mouth every 8 (eight) hours as needed for nausea or vomiting.    [provider]  potassium chloride SA (K-DUR,KLOR-CON) 20 MEQ tablet Take 40 mEq by mouth 2 (two) times daily.    [provider]  pravastatin (PRAVACHOL) 20 MG tablet Take 20 mg by mouth daily.  06/25/13   [provider]  Probiotic Product (PROBIOTIC PO) Take 1 tablet by mouth daily.    [provider]  zinc gluconate 50 MG tablet Take 50 mg by mouth daily.    [provider]     Family History  Problem Relation Age of Onset  . Heart failure Mother   .  Diabetes Mother   . Stroke Father   . Heart failure Father   . Cancer Father     Social History   Socioeconomic History  . Marital status: Married    Spouse name: Glendell Docker  . Number of children: 2  . Years of education: 36  . Highest education level: Not on file  Occupational History    Comment: NA, disabled  Tobacco Use  . Smoking status: Never Smoker  . Smokeless tobacco: Never Used  Vaping Use  . Vaping Use: Never used  Substance and Sexual Activity  . Alcohol use: No  . Drug use: No  . Sexual activity: Not on file  Other Topics Concern  . Not on file  Social History Narrative   Lives with husband and son   Social Determinants of Health   Financial Resource Strain: Not on file  Food Insecurity: Not on file  Transportation Needs: Not on file  Physical Activity: Not on file  Stress: Not on file  Social Connections: Not on file    ECOG Status: 1 - Symptomatic but completely ambulatory  Review of Systems  Review of Systems: A 12 point ROS discussed and pertinent positives are indicated in the HPI above.  All other systems are negative.  Physical Exam No direct physical exam was performed (except for noted visual exam findings with  Video Visits).   Vital Signs: There were no vitals taken for this visit.  Imaging: CT ABDOMEN W WO CONTRAST  Result Date: 06/03/2020 CLINICAL DATA:  History of right renal cell carcinoma status post ablation x2, most recently in 2020. EXAM: CT ABDOMEN WITHOUT AND WITH CONTRAST TECHNIQUE: Multidetector CT imaging of the abdomen was performed following the standard protocol before and following the bolus administration of intravenous contrast. CONTRAST:  132mL OMNIPAQUE IOHEXOL 300 MG/ML  SOLN COMPARISON:  CTs 11/16/2019 and 08/10/2019. FINDINGS: Lower chest: Stable chronic 4 mm left lower lobe pulmonary nodule from multiple prior studies, consistent with a benign finding. No new or enlarging nodules. Aortic and coronary artery atherosclerosis noted. Hepatobiliary: Stable moderate hepatic steatosis and hepatomegaly. No focal lesion or abnormal enhancement following contrast. No significant biliary dilatation post cholecystectomy. Pancreas: Unremarkable. No pancreatic ductal dilatation or surrounding inflammatory changes. Spleen: Normal in size without focal abnormality. Adrenals/Urinary Tract: Both adrenal glands appear normal. Pre contrast images demonstrate no renal or ureteral calculi. The ablation defect in the upper pole of the right kidney demonstrates further contraction, measuring 1.7 x 1.1 cm on image 48/6 (previously 1.7 x 1.5 cm). There is no suspicious enhancement in this area. Stable bilateral renal cortical scarring and too small to characterize low-density lesions bilaterally. No hydronephrosis. Stomach/Bowel: The stomach appears unremarkable for its degree of distension. No evidence of bowel wall thickening, distention or surrounding inflammatory change. Vascular/Lymphatic: There are no enlarged abdominal lymph nodes. Stable mild aortic and branch vessel atherosclerosis without acute vascular findings. The renal, portal, superior mesenteric and splenic veins are patent. Other: No evidence of  abdominal wall hernia or ascites. Musculoskeletal: No acute or significant osseous findings. Facet arthropathy in the lumbar spine. IMPRESSION: 1. Further contraction of ablation defect in the upper pole of the right kidney. No evidence of local recurrence or metastatic disease. 2. Stable hepatic steatosis and hepatomegaly. 3. Aortic Atherosclerosis (ICD10-I70.0). Electronically Signed   By: Richardean Sale M.D.   On: 06/03/2020 12:44    Labs:  CBC: Recent Labs    03/09/20 0556 03/09/20 0609  WBC 7.0  --   HGB 12.7 12.9  HCT 40.6 38.0  PLT 277  --     COAGS: Recent Labs    03/09/20 0556  INR 0.9  APTT 29    BMP: Recent Labs    11/16/19 0854 03/09/20 0556 03/09/20 0609 06/03/20 0836  NA  --  139 142  --   K  --  3.7 3.6  --   CL  --  104 105  --   CO2  --  22  --   --   GLUCOSE  --  216* 211*  --   BUN  --  11 12  --   CALCIUM  --  8.1*  --   --   CREATININE 1.20* 1.13* 1.10* 1.00  GFRNONAA  --  57*  --   --     LIVER FUNCTION TESTS: Recent Labs    03/09/20 0556  BILITOT 0.5  AST 21  ALT 22  ALKPHOS 57  PROT 6.8  ALBUMIN 3.6    TUMOR MARKERS: No results for input(s): AFPTM, CEA, CA199, CHROMGRNA in the last 8760 hours.  Assessment and Plan:  Denise Johnsonis a 59 y.o.femaleis a 60 year old female with past medical history significant for diabetes, hypertension, hyperlipidemia and thyroid cancer who initially underwent image guided right-sided renal cryoablation on 08/05/2017 of 2 right-sided renal lesions (superior pole and posterior interpolar lesions) who was found to have residual disease involving the superior pole lesion for whichshe underwentarepeat renal cryoablation on 11/15/2018.   She is seen in follow-up consultation via telemedicine following the acquisition of postprocedural CT scan performed3/10/2020.  Personal review of surveillance CT scan of the abdomen and pelvis performed 11/16/2019 demonstrates a technically excellent result with  continued reduction in size of cryoablation defect involving the posterior medial aspect of the right kidney, currently measuring 1.7 x 1.1 cm, previously, 1.7 x 1.5 cm, without evidence of nodular enhancing tissue to suggest residual or recurrent disease.  No evidence of metastatic disease within the abdomen.  This examination documents 1.5 years of stability since undergoing the final cryoablation performed 11/15/2018.    As such we perform our next surveillance examination in 6 months.  Note, surveillance imaging will be performed with CT given patient's history of MRI contrast allergy.  Patient demonstrated excellent understanding of the above conversation and is in agreement with the proposed plan of care.  Plan: - Telemedicine consultation following acquisition of surveillance renal protocol CT scan in 6 months (mid September 2022).  A copy of this report was sent to the requesting provider on this date.  Electronically Signed: Sandi Mariscal 06/05/2020, 8:14 AM   I spent a total of 5 Minutes in remote  clinical consultation, greater than 50% of which was counseling/coordinating care for post right renal cryoablation.    Visit type: Audio only (telephone). Audio (no video) only due to patient's lack of internet/smartphone capability. Alternative for in-person consultation at [facility], [place] [place] [address] [place], [place], [place]. This visit type was conducted due to national recommendations for restrictions regarding the COVID-19 Pandemic (e.g. social distancing).  This format is felt to be most appropriate for this patient at this time.  All issues noted in this document were discussed and addressed.

## 2020-07-10 ENCOUNTER — Ambulatory Visit (INDEPENDENT_AMBULATORY_CARE_PROVIDER_SITE_OTHER): Payer: Medicare Other

## 2020-07-10 DIAGNOSIS — G459 Transient cerebral ischemic attack, unspecified: Secondary | ICD-10-CM

## 2020-07-10 LAB — CUP PACEART REMOTE DEVICE CHECK
Date Time Interrogation Session: 20220413174408
Implantable Pulse Generator Implant Date: 20220307

## 2020-07-28 NOTE — Progress Notes (Signed)
Carelink Summary Report / Loop Recorder 

## 2020-08-04 ENCOUNTER — Ambulatory Visit (INDEPENDENT_AMBULATORY_CARE_PROVIDER_SITE_OTHER): Payer: Medicare Other | Admitting: Diagnostic Neuroimaging

## 2020-08-04 ENCOUNTER — Encounter: Payer: Self-pay | Admitting: Diagnostic Neuroimaging

## 2020-08-04 VITALS — BP 135/77 | HR 78 | Ht 64.0 in | Wt 236.0 lb

## 2020-08-04 DIAGNOSIS — G459 Transient cerebral ischemic attack, unspecified: Secondary | ICD-10-CM | POA: Diagnosis not present

## 2020-08-04 NOTE — Patient Instructions (Signed)
TIA vs complicated migraine - continue plavix 75mg  daily - continue diabetes, BP and statin - continue heart monitor

## 2020-08-04 NOTE — Progress Notes (Signed)
[facility]  PATIENT: [name] DOB: 02/28/1961  REFERRING CLINICIAN: Rosalin Hawking, MD HISTORY FROM: patient REASON FOR VISIT: new consult   HISTORICAL  CHIEF COMPLAINT:  Chief Complaint  Patient presents with  . Transient Ischemic Attack    Rm 6 Hospital FU  TIA Dec 2021    HISTORY OF PRESENT ILLNESS:   UPDATE (08/04/20, VRP): Since last visit, doing well until Dec 2021 --> had another TIA event (left face drooping, left sided numbness, x 1 hour). Then resolved. TIA workup completed at hospital. Now back to baseline. Has implanted loop recorder currently.   PRIOR HPI ([id number]): 60 year old female here for evaluation of possible transient ischemic attacks.   June 2017 patient had onset of numbness, tingling, lip twitching, nausea and vomiting. Patient was admitted for stroke workup. MRI of the brain was negative. Stroke risk factors were identified and treated.  February 2018 patient had onset of left arm and face numbness and weakness lasting for 4-6 hours. Patient was admitted for workup. MRI of the brain was negative for acute stroke. Stroke risk factors were evaluated and treated.  Since that time symptoms have resolved. Patient continues to have other issues including memory loss, headaches, numbness, weakness, dizziness, not asleep, joint pain, increased thirst, short of breath, fevers, chills, fatigue, chest pain, swelling of legs, ringing in ears, spinning sensation.  Patient has history of diabetes, hypertension, thyroid cancer, irregular heartbeat/atrial fibrillation in the past.    REVIEW OF SYSTEMS: Full 14 system review of systems performed and negative with exception of: As per history of present illness.   ALLERGIES: Allergies  Allergen Reactions  . Lorabid [Loracarbef] Anaphylaxis  . Amoxicillin Other (See Comments)  . Augmentin [Amoxicillin-Pot Clavulanate] Hives, Nausea And Vomiting and Other (See Comments)    Chest pain  . Codeine  Nausea And Vomiting  . Gadolinium Derivatives Itching, Swelling and Other (See Comments)    Patient will require 13 hr prep prior to administration of gadolinium    . Sulfa Drugs Cross Reactors Other (See Comments)    Severe allergic reaction as a child - was not told symptoms  . Benicar [Olmesartan Medoxomil] Swelling and Rash  . Other Rash    Green peas    HOME MEDICATIONS: Outpatient Medications Prior to Visit  Medication Sig Dispense Refill  . acetaminophen (TYLENOL) 500 MG tablet Take 2 tablets (1,000 mg total) by mouth every 6 (six) hours as needed (body aches). 30 tablet 0  . albuterol (PROVENTIL HFA;VENTOLIN HFA) 108 (90 Base) MCG/ACT inhaler Inhale 1-2 puffs into the lungs every 6 (six) hours as needed for wheezing or shortness of breath. 1 Inhaler 0  . b complex vitamins tablet Take 1 tablet by mouth daily.    . calcium carbonate (OSCAL) 1500 (600 Ca) MG TABS tablet Take by mouth 2 (two) times daily with a meal. MAGNESIUM AND ZINC 2 IN AM 2 AT 1200 PM 2 AT DINNERTIME    . clopidogrel (PLAVIX) 75 MG tablet Take 1 tablet (75 mg total) by mouth daily. 30 tablet 0  . diphenhydrAMINE (BENADRYL) 25 MG tablet Take 50 mg by mouth at bedtime.     . fluticasone (FLONASE) 50 MCG/ACT nasal spray Place 2 sprays into both nostrils daily as needed for allergies.    . furosemide (LASIX) 40 MG tablet Take 2 tablets (80 mg total) by mouth 2 (two) times daily.    Marland Kitchen ibuprofen (ADVIL) 200 MG tablet Take 800 mg by mouth every 8 (eight) hours as  needed for headache or moderate pain.    Marland Kitchen JANUMET 50-1000 MG tablet Take 1 tablet by mouth 2 (two) times daily.  3  . levothyroxine (SYNTHROID, LEVOTHROID) 300 MCG tablet Take 300 mcg by mouth daily before breakfast.    . lisinopril (PRINIVIL,ZESTRIL) 10 MG tablet Take 10 mg by mouth daily.   5  . metoprolol tartrate (LOPRESSOR) 100 MG tablet Take 100 mg by mouth 2 (two) times daily.    . Multiple Vitamin (MULTIVITAMIN PO) Take 1 tablet by mouth daily.    Marland Kitchen  omeprazole (PRILOSEC OTC) 20 MG tablet Take 20 mg by mouth daily.    . ondansetron (ZOFRAN) 8 MG tablet Take 8 mg by mouth every 8 (eight) hours as needed for nausea or vomiting.    . potassium chloride SA (K-DUR,KLOR-CON) 20 MEQ tablet Take 40 mEq by mouth 2 (two) times daily.    . pravastatin (PRAVACHOL) 20 MG tablet Take 20 mg by mouth daily.     . Probiotic Product (PROBIOTIC PO) Take 1 tablet by mouth daily.    Marland Kitchen zinc gluconate 50 MG tablet Take 50 mg by mouth daily.     No facility-administered medications prior to visit.    PAST MEDICAL HISTORY: Past Medical History:  Diagnosis Date  . Anemia    hx of   . 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 Asthma    due to allergies   . Cancer ([place])    THRYOID 2016 Nitro WITH SURGERY, KIDNEY CANCER MAY 2018  RIGHT RENAL CRYOABLATION  . Complication of anesthesia   . Diabetes mellitus    TYPE 2  . Dyspnea    increased exertion   . Dysrhythmia    " irregular heart rate per patient"  . Family history of adverse reaction to anesthesia    difficult for father to wake after surgery, AFTHER LOW BP WITH ANESTHESIA  . Heart murmur   . HOH (hard of hearing)   . Hypercholesteremia   . Hypertension   . PONV (postoperative nausea and vomiting)   . Renal cancer, right ([place]) dx'd 07/2018, 10/2018   ablation x 2   . Thyroid disease   . TIA (transient ischemic attack)    LAST TIA  APRIL 2020 ON PLAVIX, reports residual effects ,02/2020    PAST SURGICAL HISTORY: Past Surgical History:  Procedure Laterality Date  . ABDOMINAL HYSTERECTOMY     COMPLETE  . BIOPSY  03/02/2019   Procedure: BIOPSY;  Surgeon: Carol Ada, MD;  Location: [facility];  Service: Endoscopy;;  . CHOLECYSTECTOMY    . COLON SURGERY    . COLONOSCOPY WITH PROPOFOL N/A 03/02/2019   Procedure: COLONOSCOPY WITH PROPOFOL;  Surgeon: Carol Ada, MD;  Location: [facility];  Service: Endoscopy;  Laterality: N/A;  . ear surgeries     . implantable loop recorder placement  06/02/2020    Medtronic Reveal Linq model [id number] implantable loop recorder (SN [id number])   . IR RADIOLOGIST EVAL & MGMT  01/12/2017  . IR RADIOLOGIST EVAL & MGMT  06/16/2017  . IR RADIOLOGIST EVAL & MGMT  08/31/2017  . IR RADIOLOGIST EVAL & MGMT  12/13/2017  . IR RADIOLOGIST EVAL & MGMT  03/16/2018  . IR RADIOLOGIST EVAL & MGMT  11/01/2018  . IR RADIOLOGIST EVAL & MGMT  12/13/2018  . IR RADIOLOGIST EVAL & MGMT  02/15/2019  . IR RADIOLOGIST EVAL & MGMT  08/16/2019  . IR RADIOLOGIST EVAL & MGMT  11/22/2019  . IR  RADIOLOGIST EVAL & MGMT  06/05/2020  . KNEE ARTHROSCOPY    . LEFT HEART CATHETERIZATION WITH CORONARY ANGIOGRAM N/A 04/24/2014   Procedure: LEFT HEART CATHETERIZATION WITH CORONARY ANGIOGRAM;  Surgeon: Burnell Blanks, MD;  Location: [facility] CATH LAB;  Service: Cardiovascular;  Laterality: N/A;  . POLYPECTOMY  03/02/2019   Procedure: POLYPECTOMY;  Surgeon: Carol Ada, MD;  Location: [facility];  Service: Endoscopy;;  . RADIOFREQUENCY ABLATION Right 08/05/2017   Procedure: CT RENAL CRYO AND BIOPSY;  Surgeon: Sandi Mariscal, MD;  Location: [facility];  Service: Anesthesiology;  Laterality: Right;  . RADIOFREQUENCY ABLATION Right 11/15/2018   Procedure: RIGHT RENAL CRYOABLATION;  Surgeon: Sandi Mariscal, MD;  Location: [facility];  Service: Anesthesiology;  Laterality: Right;  . THYROIDECTOMY N/A 09/20/2014   Procedure: TOTAL THYROIDECTOMY;  Surgeon: Armandina Gemma, MD;  Location: [facility];  Service: General;  Laterality: N/A;  . TONSILLECTOMY      FAMILY HISTORY: Family History  Problem Relation Age of Onset  . Heart failure Mother   . Diabetes Mother   . Stroke Mother   . Stroke Father   . Heart failure Father   . Cancer Father     SOCIAL HISTORY:  Social History   Socioeconomic History  . Marital status: Married    Spouse name: Glendell Docker  . Number of children: 2  . Years of education: 13  . Highest education level: Not on file  Occupational History    Comment: NA, disabled  Tobacco Use  . Smoking  status: Never Smoker  . Smokeless tobacco: Never Used  Vaping Use  . Vaping Use: Never used  Substance and Sexual Activity  . Alcohol use: No  . Drug use: No  . Sexual activity: Not on file  Other Topics Concern  . Not on file  Social History Narrative   Lives with husband and son   Social Determinants of Health   Financial Resource Strain: Not on file  Food Insecurity: Not on file  Transportation Needs: Not on file  Physical Activity: Not on file  Stress: Not on file  Social Connections: Not on file  Intimate Partner Violence: Not on file     PHYSICAL EXAM  GENERAL EXAM/CONSTITUTIONAL: Vitals:  Vitals:   08/04/20 0931  BP: 135/77  Pulse: 78  Weight: 236 lb (107 kg)  Height: 5\' 4"  (1.626 m)   Body mass index is 40.51 kg/m. No exam data present  Patient is in no distress; well developed, nourished and groomed; neck is supple  CARDIOVASCULAR:  Examination of carotid arteries is normal; no carotid bruits  Regular rate and rhythm, no murmurs  Examination of peripheral vascular system by observation and palpation is normal  EYES:  Ophthalmoscopic exam of optic discs and posterior segments is normal; no papilledema or hemorrhages  MUSCULOSKELETAL:  Gait, strength, tone, movements noted in Neurologic exam below  NEUROLOGIC: MENTAL STATUS:  No flowsheet data found.  awake, alert, oriented to person, place and time  recent and remote memory intact  normal attention and concentration  language fluent, comprehension intact, naming intact,   fund of knowledge appropriate  CRANIAL NERVE:   2nd - no papilledema on fundoscopic exam  2nd, 3rd, 4th, 6th - pupils equal and reactive to light, visual fields full to confrontation, extraocular muscles intact, no nystagmus  5th - facial sensation symmetric  7th - facial strength symmetric  8th - hearing intact  9th - palate elevates symmetrically, uvula midline  11th - shoulder shrug symmetric  12th -  tongue protrusion midline  MOTOR:   normal bulk and tone, full strength in the BUE, BLE  SENSORY:   normal and symmetric to light touch  ABSENT VIB AT TOES, ANKLES AND KNEES  COORDINATION:   finger-nose-finger, fine finger movements normal  REFLEXES:   deep tendon reflexes TRACE and symmetric  GAIT/STATION:   ANTALGIC, LIMPING GAIT; LEFT LEG PAIN    DIAGNOSTIC DATA (LABS, IMAGING, TESTING) - I reviewed patient records, labs, notes, testing and imaging myself where available.  Lab Results  Component Value Date   WBC 7.0 03/09/2020   HGB 12.9 03/09/2020   HCT 38.0 03/09/2020   MCV 93.8 03/09/2020   PLT 277 03/09/2020      Component Value Date/Time   NA 142 03/09/2020 0609   K 3.6 03/09/2020 0609   CL 105 03/09/2020 0609   CO2 22 03/09/2020 0556   GLUCOSE 211 (H) 03/09/2020 0609   BUN 12 03/09/2020 0609   CREATININE 1.00 06/03/2020 0836   CREATININE 1.15 (H) 12/11/2018 0751   CALCIUM 8.1 (L) 03/09/2020 0556   PROT 6.8 03/09/2020 0556   ALBUMIN 3.6 03/09/2020 0556   AST 21 03/09/2020 0556   ALT 22 03/09/2020 0556   ALKPHOS 57 03/09/2020 0556   BILITOT 0.5 03/09/2020 0556   GFRNONAA 57 (L) 03/09/2020 0556   GFRNONAA 53 (L) 12/11/2018 0751   GFRAA 62 12/11/2018 0751   Lab Results  Component Value Date   CHOL 160 03/10/2020   HDL 37 (L) 03/10/2020   LDLCALC 54 03/10/2020   TRIG 345 (H) 03/10/2020   CHOLHDL 4.3 03/10/2020   Lab Results  Component Value Date   HGBA1C 8.4 (H) 03/10/2020   No results found for: [id number] Lab Results  Component Value Date   TSH <0.010 (L) 06/26/2017     09/13/15 CT soft tissue neck - LEFT parotid appears slightly larger than the RIGHT, correlate clinically for mild inflammatory change in the LEFT parotid tail. - LEFT paramedian medial incisor periapical lucency. Uncertain significance with regard and jaw pain. - No mandibular periodontal disease or other significant abnormality.  - Status post thyroidectomy.  No  concerning lymphadenopathy.  05/16/16 MRI brain - No acute intracranial abnormality. Stable since 2017 and largely unremarkable for age noncontrast MRI appearance of the brain.  05/16/16 MRA head  - Stable and negative intracranial MRA.  05/17/16 carotid u/s - The vertebral arteries appear patent with antegrade flow. - Findings consistent with 1-39 percent stenosis involving the right internal carotid artery and the left internal carotid artery.  05/17/16 TTE  - Left ventricle: The cavity size was normal. Wall thickness was   increased in a pattern of mild LVH. Systolic function was normal.   The estimated ejection fraction was in the range of 60% to 65%.   Left ventricular diastolic function parameters were normal. - Left atrium: The atrium was mildly dilated. - Atrial septum: No defect or patent foramen ovale was identified.  Dec 2021 TIA workup (per Dr Erlinda Hong note 03/10/20):  CT head No acute abnormality. Mild Small vessel disease.   MRI  No acute abnormality. Mild Small vessel disease.   CTA head & neck no LVO. Minimal atherosclerosis. Partially empty sella w/ effaced transverse and sigmoid sinus. Coronary atherosclerosis.  2D Echo EF 60-65%  LDL 54  HgbA1c 8.4     ASSESSMENT AND PLAN  60 y.o. year old female here with hypertension, diabetes, with 2 episodes of strokelike symptoms, with negative MRI brain.    Dx: TIA  1. TIA (transient ischemic attack)      PLAN:  TIA vs complicated migraine - continue plavix 75mg  daily - continue diabetes, BP and statin - continue heart monitor  Return for return to PCP.    Penni Bombard, MD 11/04/2117, 41:74 AM Certified in Neurology, Neurophysiology and Neuroimaging  [facility] [address] [place], [place] [address] [phone]

## 2020-08-11 ENCOUNTER — Ambulatory Visit (HOSPITAL_COMMUNITY)
Admission: EM | Admit: 2020-08-11 | Discharge: 2020-08-11 | Disposition: A | Discharge status: Home / Self Care | Payer: Medicare Other | Attending: Student | Admitting: Student

## 2020-08-11 ENCOUNTER — Other Ambulatory Visit: Payer: Self-pay

## 2020-08-11 ENCOUNTER — Encounter (HOSPITAL_COMMUNITY): Payer: Self-pay | Admitting: Emergency Medicine

## 2020-08-11 DIAGNOSIS — L0201 Cutaneous abscess of face: Secondary | ICD-10-CM

## 2020-08-11 MED ORDER — DOXYCYCLINE HYCLATE 100 MG PO CAPS: 100.0000 mg | ORAL_CAPSULE | Freq: Two times a day (BID) | ORAL | 0 refills | Status: AC

## 2020-08-11 NOTE — Discharge Instructions (Signed)
-  Doxycycline twice daily for 7 days.  This antibiotic can increase your chance of sunburn, so wear sunscreen if spending long periods of time outside. -With the location of your abscess, it is very important that you seek additional medical treatment if your symptoms get worse at all.  This includes new fever/chills, the swelling gets worse, there is swelling inside your mouth or your throat, you feel short of breath, you feel dizzy, chest pain, etc.  This could mean that the abscess is getting so big that it is starting to block your airway, which is a very serious issue.  Head straight to the emergency room or call 911 if this develops. -Try to follow-up with a dentist for further evaluation and management of your tooth issues, information later in this packet.

## 2020-08-11 NOTE — ED Triage Notes (Signed)
Patient c/o abscess on the left side of her jaw x 1 day.  Patient states that it is uncomfortable.  Patient has taken Ibuprofen.

## 2020-08-11 NOTE — ED Provider Notes (Signed)
[place]    CSN: [id number] Arrival date & time: 08/11/20  6440      History   Chief Complaint Chief Complaint  Patient presents with  . Abscess    HPI [name] is a 60 y.o. female presenting with jaw abscess for 1 day.  Medical history thyroid cancer 2016, anemia, arthritis, dyspnea, dysrhythmia, high cholesterol, renal cancer 2020, TIA.   Notes 1 day of swelling and tenderness on the left side of her jaw.  States she has had dental infections before and is concerned that this is happening again.  Denies foul taste in mouth, mouth or facial swelling, sensation of throat closing, fever/chills, dizziness, shortness of breath.  HPI  Past Medical History:  Diagnosis Date  . Anemia    hx of   . 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 Asthma    due to allergies   . Cancer ([place])    THRYOID 2016 [place] WITH SURGERY, KIDNEY CANCER MAY 2018  RIGHT RENAL CRYOABLATION  . Complication of anesthesia   . Diabetes mellitus    TYPE 2  . Dyspnea    increased exertion   . Dysrhythmia    " irregular heart rate per patient"  . Family history of adverse reaction to anesthesia    difficult for father to wake after surgery, AFTHER LOW BP WITH ANESTHESIA  . Heart murmur   . HOH (hard of hearing)   . Hypercholesteremia   . Hypertension   . PONV (postoperative nausea and vomiting)   . Renal cancer, right ([place]) dx'd 07/2018, 10/2018   ablation x 2   . Thyroid disease   . TIA (transient ischemic attack)    LAST TIA  APRIL 2020 ON PLAVIX, reports residual effects ,02/2020    Patient Active Problem List   Diagnosis Date Noted  . TIA (transient ischemic attack) 03/09/2020  . 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[place]) 11/15/2018  . 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 UTI (urinary tract infection) 06/26/2017  . Urinary tract infection 01/24/2017  . Abdominal pain 01/21/2017  . Stroke-like symptoms 05/16/2016  . 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 Fatty liver disease, nonalcoholic 34/74/2595  . Slurred speech 09/13/2015  . Parotitis 09/13/2015  . Gastroenteritis 08/21/2015  . Hypothyroid 08/21/2015  . 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 Pain in the chest   . Essential hypertension   . Thoracic aortic aneurysm ([place])   . Chest pain 04/23/2014  . Anginal pain ([place]) 04/23/2014  . Type 2 diabetes mellitus ([place])   . Hypertension   . Hypercholesteremia     Past Surgical History:  Procedure Laterality Date  . ABDOMINAL HYSTERECTOMY     COMPLETE  . BIOPSY  03/02/2019   Procedure: BIOPSY;  Surgeon: Carol Ada, MD;  Location: [facility];  Service: Endoscopy;;  . CHOLECYSTECTOMY    . COLON SURGERY    . COLONOSCOPY WITH PROPOFOL N/A 03/02/2019   Procedure: COLONOSCOPY WITH PROPOFOL;  Surgeon: Carol Ada, MD;  Location: [facility];  Service: Endoscopy;  Laterality: N/A;  . ear surgeries     . implantable loop recorder placement  06/02/2020   [employer]onic Reveal Linq model [id number] implantable loop recorder (SN [id number])   . IR RADIOLOGIST EVAL & MGMT  01/12/2017  . IR RADIOLOGIST EVAL & MGMT  06/16/2017  . IR RADIOLOGIST EVAL & MGMT  08/31/2017  . IR RADIOLOGIST EVAL & MGMT  12/13/2017  . IR RADIOLOGIST EVAL & MGMT  03/16/2018  . IR RADIOLOGIST EVAL & MGMT  11/01/2018  . IR RADIOLOGIST EVAL & MGMT  12/13/2018  . IR RADIOLOGIST EVAL & MGMT  02/15/2019  . IR RADIOLOGIST EVAL & MGMT  08/16/2019  . IR RADIOLOGIST EVAL & MGMT  11/22/2019  . IR RADIOLOGIST EVAL & MGMT  06/05/2020  . KNEE ARTHROSCOPY    . LEFT HEART CATHETERIZATION WITH CORONARY ANGIOGRAM N/A 04/24/2014   Procedure: LEFT HEART CATHETERIZATION WITH CORONARY ANGIOGRAM;  Surgeon: Burnell Blanks, MD;  Location: [facility] CATH LAB;  Service: Cardiovascular;  Laterality: N/A;  . POLYPECTOMY  03/02/2019   Procedure: POLYPECTOMY;  Surgeon: Carol Ada, MD;  Location: [facility];  Service: Endoscopy;;  .  RADIOFREQUENCY ABLATION Right 08/05/2017   Procedure: CT RENAL CRYO AND BIOPSY;  Surgeon: Sandi Mariscal, MD;  Location: [facility];  Service: Anesthesiology;  Laterality: Right;  . RADIOFREQUENCY ABLATION Right 11/15/2018   Procedure: RIGHT RENAL CRYOABLATION;  Surgeon: Sandi Mariscal, MD;  Location: [facility];  Service: Anesthesiology;  Laterality: Right;  . THYROIDECTOMY N/A 09/20/2014   Procedure: TOTAL THYROIDECTOMY;  Surgeon: Armandina Gemma, MD;  Location: [facility];  Service: General;  Laterality: N/A;  . TONSILLECTOMY      OB History   No obstetric history on file.      Home Medications    Prior to Admission medications   Medication Sig Start Date End Date Taking? Authorizing Provider  acetaminophen (TYLENOL) 500 MG tablet Take 2 tablets (1,000 mg total) by mouth every 6 (six) hours as needed (body aches). 06/29/17  Yes Vann, Jessica U, DO  albuterol (PROVENTIL HFA;VENTOLIN HFA) 108 (90 Base) MCG/ACT inhaler Inhale 1-2 puffs into the lungs every 6 (six) hours as needed for wheezing or shortness of breath. 12/28/17  Yes Jean Rosenthal, MD  b complex vitamins tablet Take 1 tablet by mouth daily.   Yes [provider]  calcium carbonate (OSCAL) 1500 (600 Ca) MG TABS tablet Take by mouth 2 (two) times daily with a meal. MAGNESIUM AND ZINC 2 IN AM 2 AT 1200 PM 2 AT DINNERTIME   Yes [provider]  clopidogrel (PLAVIX) 75 MG tablet Take 1 tablet (75 mg total) by mouth daily. 09/15/15  Yes Florencia Reasons, MD  diphenhydrAMINE (BENADRYL) 25 MG tablet Take 50 mg by mouth at bedtime.    Yes [provider]  doxycycline (VIBRAMYCIN) 100 MG capsule Take 1 capsule (100 mg total) by mouth 2 (two) times daily for 7 days. 08/11/20 08/18/20 Yes Hazel Sams, PA-C  fluticasone [facility]) 50 MCG/ACT nasal spray Place 2 sprays into both nostrils daily as needed for allergies. 03/04/20  Yes [provider]  furosemide (LASIX) 40 MG tablet Take 2 tablets (80 mg total) by mouth 2 (two) times daily.  05/17/16  Yes Vann, Jessica U, DO  ibuprofen (ADVIL) 200 MG tablet Take 800 mg by mouth every 8 (eight) hours as needed for headache or moderate pain.   Yes [provider]  JANUMET 50-1000 MG tablet Take 1 tablet by mouth 2 (two) times daily. 09/16/17  Yes [provider]  levothyroxine (SYNTHROID, LEVOTHROID) 300 MCG tablet Take 300 mcg by mouth daily before breakfast.   Yes [provider]  lisinopril (PRINIVIL,ZESTRIL) 10 MG tablet Take 10 mg by mouth daily.  09/09/14  Yes [provider]  metoprolol tartrate (LOPRESSOR) 100 MG tablet Take 100 mg by mouth 2 (two) times daily. 06/23/16  Yes [provider]  Multiple Vitamin (MULTIVITAMIN PO) Take 1 tablet by mouth daily.   Yes [provider]  omeprazole (PRILOSEC OTC) 20 MG tablet Take 20 mg by mouth daily.   Yes [provider]  ondansetron (ZOFRAN) 8 MG tablet Take 8 mg by mouth every 8 (eight) hours as needed for nausea or vomiting.   Yes [provider]  potassium chloride SA (K-DUR,KLOR-CON) 20 MEQ tablet Take 40 mEq by mouth 2 (two) times daily.   Yes [provider]  pravastatin (PRAVACHOL) 20 MG tablet Take 20 mg by mouth daily.  06/25/13  Yes [provider]  Probiotic Product (PROBIOTIC PO) Take 1 tablet by mouth daily.   Yes [provider]  zinc gluconate 50 MG tablet Take 50 mg by mouth daily.   Yes [provider]    Family History Family History  Problem Relation Age of Onset  . Heart failure Mother   . Diabetes Mother   . Stroke Mother   . Stroke Father   . Heart failure Father   . Cancer Father     Social History Social History   Tobacco Use  . Smoking status: Never Smoker  . Smokeless tobacco: Never Used  Vaping Use  . Vaping Use: Never used  Substance Use Topics  . Alcohol use: No  . Drug use: No     Allergies   Lorabid [loracarbef], Amoxicillin, Augmentin [amoxicillin-pot clavulanate], Codeine,  Gadolinium derivatives, Sulfa drugs cross reactors, Benicar [olmesartan medoxomil], and Other   Review of Systems Review of Systems  HENT: Positive for facial swelling.   All other systems reviewed and are negative.    Physical Exam Triage Vital Signs ED Triage Vitals  Enc Vitals Group     BP 08/11/20 1122 129/83     Pulse Rate 08/11/20 1122 73     Resp --      Temp 08/11/20 1122 98.6 F (37 C)     Temp Source 08/11/20 1122 Oral     SpO2 08/11/20 1122 99 %     Weight --      Height --      Head Circumference --      Peak Flow --      Pain Score 08/11/20 1123 8     Pain Loc --      Pain Edu? --      Excl. in [place]? --    No data found.  Updated Vital Signs BP 129/83 (BP Location: Right Arm)   Pulse 73   Temp 98.6 F (37 C) (Oral)   SpO2 99%   Visual Acuity Right Eye Distance:   Left Eye Distance:   Bilateral Distance:    Right Eye Near:   Left Eye Near:    Bilateral Near:     Physical Exam Vitals reviewed.  Constitutional:      General: She is not in acute distress.    Appearance: Normal appearance. She is not ill-appearing or diaphoretic.  HENT:     Head: Normocephalic and atraumatic.      Comments: 1cm round area of induration with overlying erythema warmth and tenderness, without fluctuence or discharge. Mobile. No dental abscess inside mouth. No lymphadenopathy. Cardiovascular:     Rate and Rhythm: Normal rate and regular rhythm.     Heart sounds: Normal heart sounds.  Pulmonary:     Effort: Pulmonary effort is normal.     Breath sounds: Normal breath sounds.  Skin:    General: Skin is warm.  Neurological:  General: No focal deficit present.     Mental Status: She is alert and oriented to person, place, and time.  Psychiatric:        Mood and Affect: Mood normal.        Behavior: Behavior normal.        Thought Content: Thought content normal.        Judgment: Judgment normal.      UC Treatments / Results  Labs (all labs ordered are  listed, but only abnormal results are displayed) Labs Reviewed - No data to display  EKG   Radiology No results found.  Procedures Procedures (including critical care time)  Medications Ordered in UC Medications - No data to display  Initial Impression / Assessment and Plan / UC Course  I have reviewed the triage vital signs and the nursing notes.  Pertinent labs & imaging results that were available during my care of the patient were reviewed by me and considered in my medical decision making (see chart for details).     This patient is a 60 year old female presenting with small localized abscess along left jawline.  She is afebrile, nontachycardic, nontachypneic.  No tenderness inside the mouth.  Plan to treat with doxycycline, with very strict return precautions.  Absolutely any worsening symptoms, fevers, etc.-history to the emergency room.  Patient verbalizes understanding and agreement.  Dental resource guide provided. This pt was treated for thyroid cancer 2016 and real cancer 2020, but denies active cancer.  Final Clinical Impressions(s) / UC Diagnoses   Final diagnoses:  Abscess, jawline     Discharge Instructions     -Doxycycline twice daily for 7 days.  This antibiotic can increase your chance of sunburn, so wear sunscreen if spending long periods of time outside. -With the location of your abscess, it is very important that you seek additional medical treatment if your symptoms get worse at all.  This includes new fever/chills, the swelling gets worse, there is swelling inside your mouth or your throat, you feel short of breath, you feel dizzy, chest pain, etc.  This could mean that the abscess is getting so big that it is starting to block your airway, which is a very serious issue.  Head straight to the emergency room or call 911 if this develops. -Try to follow-up with a dentist for further evaluation and management of your tooth issues, information later in this  packet.    ED Prescriptions    Medication Sig Dispense Auth. Provider   doxycycline (VIBRAMYCIN) 100 MG capsule Take 1 capsule (100 mg total) by mouth 2 (two) times daily for 7 days. 14 capsule Hazel Sams, PA-C     PDMP not reviewed this encounter.   Hazel Sams, PA-C 08/11/20 1153

## 2020-08-12 ENCOUNTER — Ambulatory Visit (INDEPENDENT_AMBULATORY_CARE_PROVIDER_SITE_OTHER): Payer: Medicare Other

## 2020-08-12 DIAGNOSIS — G459 Transient cerebral ischemic attack, unspecified: Secondary | ICD-10-CM | POA: Diagnosis not present

## 2020-08-12 LAB — CUP PACEART REMOTE DEVICE CHECK
Date Time Interrogation Session: 20220516174231
Implantable Pulse Generator Implant Date: 20220307

## 2020-09-04 NOTE — Progress Notes (Signed)
Carelink Summary Report / Loop Recorder 

## 2020-09-15 ENCOUNTER — Ambulatory Visit (INDEPENDENT_AMBULATORY_CARE_PROVIDER_SITE_OTHER): Payer: Medicare Other

## 2020-09-15 DIAGNOSIS — G459 Transient cerebral ischemic attack, unspecified: Secondary | ICD-10-CM

## 2020-09-16 LAB — CUP PACEART REMOTE DEVICE CHECK
Date Time Interrogation Session: 20220618174645
Implantable Pulse Generator Implant Date: 20220307

## 2020-10-06 NOTE — Progress Notes (Signed)
Carelink Summary Report / Loop Recorder 

## 2020-10-09 ENCOUNTER — Emergency Department (HOSPITAL_BASED_OUTPATIENT_CLINIC_OR_DEPARTMENT_OTHER): Payer: Medicare Other

## 2020-10-09 ENCOUNTER — Other Ambulatory Visit: Payer: Self-pay

## 2020-10-09 ENCOUNTER — Encounter (HOSPITAL_COMMUNITY): Payer: Self-pay | Admitting: *Deleted

## 2020-10-09 ENCOUNTER — Emergency Department (HOSPITAL_COMMUNITY)
Admission: EM | Admit: 2020-10-09 | Discharge: 2020-10-09 | Disposition: A | Discharge status: Home / Self Care | Payer: Medicare Other | Attending: Emergency Medicine | Admitting: Emergency Medicine

## 2020-10-09 ENCOUNTER — Emergency Department (HOSPITAL_COMMUNITY): Payer: Medicare Other

## 2020-10-09 DIAGNOSIS — I10 Essential (primary) hypertension: Secondary | ICD-10-CM | POA: Diagnosis not present

## 2020-10-09 DIAGNOSIS — I2510 Atherosclerotic heart disease of native coronary artery without angina pectoris: Secondary | ICD-10-CM | POA: Diagnosis not present

## 2020-10-09 DIAGNOSIS — Z79899 Other long term (current) drug therapy: Secondary | ICD-10-CM | POA: Diagnosis not present

## 2020-10-09 DIAGNOSIS — M7122 Synovial cyst of popliteal space [Baker], left knee: Secondary | ICD-10-CM

## 2020-10-09 DIAGNOSIS — E039 Hypothyroidism, unspecified: Secondary | ICD-10-CM | POA: Diagnosis not present

## 2020-10-09 DIAGNOSIS — J45909 Unspecified asthma, uncomplicated: Secondary | ICD-10-CM | POA: Diagnosis not present

## 2020-10-09 DIAGNOSIS — M79662 Pain in left lower leg: Secondary | ICD-10-CM | POA: Insufficient documentation

## 2020-10-09 DIAGNOSIS — Z85850 Personal history of malignant neoplasm of thyroid: Secondary | ICD-10-CM | POA: Diagnosis not present

## 2020-10-09 DIAGNOSIS — Z955 Presence of coronary angioplasty implant and graft: Secondary | ICD-10-CM | POA: Insufficient documentation

## 2020-10-09 DIAGNOSIS — Z85528 Personal history of other malignant neoplasm of kidney: Secondary | ICD-10-CM | POA: Diagnosis not present

## 2020-10-09 DIAGNOSIS — E119 Type 2 diabetes mellitus without complications: Secondary | ICD-10-CM | POA: Insufficient documentation

## 2020-10-09 DIAGNOSIS — M79605 Pain in left leg: Secondary | ICD-10-CM | POA: Diagnosis not present

## 2020-10-09 DIAGNOSIS — M1712 Unilateral primary osteoarthritis, left knee: Secondary | ICD-10-CM

## 2020-10-09 LAB — BASIC METABOLIC PANEL
Anion gap: 12 (ref 5–15)
BUN: 19 mg/dL (ref 6–20)
CO2: 24 mmol/L (ref 22–32)
Calcium: 8.4 mg/dL — ABNORMAL LOW (ref 8.9–10.3)
Chloride: 104 mmol/L (ref 98–111)
Creatinine, Ser: 1.3 mg/dL — ABNORMAL HIGH (ref 0.44–1.00)
GFR, Estimated: 48 mL/min — ABNORMAL LOW (ref >60)
Glucose, Bld: 218 mg/dL — ABNORMAL HIGH (ref 70–99)
Potassium: 3.8 mmol/L (ref 3.5–5.1)
Sodium: 140 mmol/L (ref 135–145)

## 2020-10-09 LAB — CBC WITH DIFFERENTIAL/PLATELET
Abs Immature Granulocytes: 0.03 10*3/uL (ref 0.00–0.07)
Basophils Absolute: 0 10*3/uL (ref 0.0–0.1)
Basophils Relative: 0 %
Eosinophils Absolute: 0.2 10*3/uL (ref 0.0–0.5)
Eosinophils Relative: 3 %
HCT: 37.3 % (ref 36.0–46.0)
Hemoglobin: 12.1 g/dL (ref 12.0–15.0)
Immature Granulocytes: 1 %
Lymphocytes Relative: 37 %
Lymphs Abs: 2.3 10*3/uL (ref 0.7–4.0)
MCH: 29.9 pg (ref 26.0–34.0)
MCHC: 32.4 g/dL (ref 30.0–36.0)
MCV: 92.1 fL (ref 80.0–100.0)
Monocytes Absolute: 0.4 10*3/uL (ref 0.1–1.0)
Monocytes Relative: 7 %
Neutro Abs: 3.2 10*3/uL (ref 1.7–7.7)
Neutrophils Relative %: 52 %
Platelets: 238 10*3/uL (ref 150–400)
RBC: 4.05 MIL/uL (ref 3.87–5.11)
RDW: 13.4 % (ref 11.5–15.5)
WBC: 6.2 10*3/uL (ref 4.0–10.5)
nRBC: 0 % (ref 0.0–0.2)

## 2020-10-09 MED ORDER — ONDANSETRON 4 MG PO TBDP
8.0000 mg | ORAL_TABLET | Freq: Once | ORAL | Status: DC
  Filled 2020-10-09: qty 2

## 2020-10-09 MED ORDER — ACETAMINOPHEN 325 MG PO TABS
650.0000 mg | ORAL_TABLET | Freq: Once | ORAL | Status: AC
  Administered 2020-10-09: 650 mg via ORAL
  Filled 2020-10-09: qty 2

## 2020-10-09 MED ORDER — ONDANSETRON 4 MG PO TBDP: 4.0000 mg | ORAL_TABLET | Freq: Three times a day (TID) | ORAL | 0 refills | Status: DC | PRN

## 2020-10-09 MED ORDER — HYDROCODONE-ACETAMINOPHEN 5-325 MG PO TABS
1.0000 | ORAL_TABLET | Freq: Once | ORAL | Status: DC
  Filled 2020-10-09: qty 1

## 2020-10-09 MED ORDER — IOHEXOL 300 MG/ML  SOLN
75.0000 mL | Freq: Once | INTRAMUSCULAR | Status: AC | PRN
  Administered 2020-10-09: 75 mL via INTRAVENOUS

## 2020-10-09 MED ORDER — DEXAMETHASONE SODIUM PHOSPHATE 10 MG/ML IJ SOLN
10.0000 mg | Freq: Once | INTRAMUSCULAR | Status: AC
  Administered 2020-10-09: 10 mg via INTRAVENOUS
  Filled 2020-10-09: qty 1

## 2020-10-09 MED ORDER — KETOROLAC TROMETHAMINE 30 MG/ML IJ SOLN
30.0000 mg | Freq: Once | INTRAMUSCULAR | Status: AC
Start: 2020-10-09 — End: 2020-10-09
  Administered 2020-10-09: 30 mg via INTRAVENOUS
  Filled 2020-10-09: qty 1

## 2020-10-09 MED ORDER — MELOXICAM 15 MG PO TABS: 15.0000 mg | ORAL_TABLET | Freq: Every day | ORAL | 0 refills | Status: DC

## 2020-10-09 MED ORDER — ONDANSETRON HCL 4 MG/2ML IJ SOLN
4.0000 mg | Freq: Once | INTRAMUSCULAR | Status: AC
Start: 2020-10-09 — End: 2020-10-09
  Administered 2020-10-09: 4 mg via INTRAVENOUS
  Filled 2020-10-09: qty 2

## 2020-10-09 NOTE — ED Provider Notes (Signed)
Pt signed out by Dr. Christy Gentles pending [place].    [place]:  Summary:  RIGHT:  - No evidence of common femoral vein obstruction.     LEFT:  - There is no evidence of deep vein thrombosis in the lower extremity.     - Cystic structure measuring 1.49 X 2.85, noted in the popliteal fossa. There is also a mass noted within the cystic structure containing mixed echoes and acoustic shadowing, etiology unknown.     *See table(s) above for measurements and observations.   Due to the cystic structure of unkn etiology, pt had a CT of her knee.  Summary:  RIGHT:  - No evidence of common femoral vein obstruction.     LEFT:  - There is no evidence of deep vein thrombosis in the lower extremity.     - Cystic structure measuring 1.49 X 2.85, noted in the popliteal fossa. There is also a mass noted within the cystic structure containing mixed echoes and acoustic shadowing, etiology unknown.     *See table(s) above for measurements and observations.    Pt is feeling better after toradol.  She will be given 1 dose of decadron prior to d/c to help with the OA inflammation.  She is instructed to f/u with ortho.  Return if worse.     Isla Pence, MD 10/09/20 1308

## 2020-10-09 NOTE — ED Notes (Signed)
Patient transported to Ultrasound 

## 2020-10-09 NOTE — ED Notes (Signed)
Pt refused Vicodin d/t concerns for reaction in the past. Experienced severe NV. Offered Zofran with medication. Pt refused. States she prefers tylenol. Secure message sent to Dr. Christy Gentles

## 2020-10-09 NOTE — ED Provider Notes (Signed)
Emergency Medicine Provider Triage Evaluation Note  [name] , a 60 y.o. female  was evaluated in triage.  Pt complains of left leg swelling.  States she has noticed some swelling to left thigh over the past few days but getting worse and more painful, especially when up and walking.  Today started feeling some tingling down into the leg.  Also feels like leg has seemed a bit more "red".  Denies fever.  No hx of DVT or PE but is concerned for clot.  Review of Systems  Positive: Left leg swelling Negative: weakness  Physical Exam  BP (!) 154/94 (BP Location: Left Arm)   Pulse 73   Resp 20   SpO2 98%  Gen:   Awake, no distress   Resp:  Normal effort  MSK:   Moves extremities without difficulty  Other:  Unable to visualize upper left leg due to tight fitting pants, does have palpable DP pulse and foot is warm and well perfused, normal motor function of left leg  Medical Decision Making  Medically screening exam initiated at 4:17 AM.  Appropriate orders placed.  [name] was informed that the remainder of the evaluation will be completed by another provider, this initial triage assessment does not replace that evaluation, and the importance of remaining in the ED until their evaluation is complete.  Will get labs.  Vascular US not available at this hour, can add on once available as clinically indicated.   Denise Pickett, PA-C 10/09/20 0420    Denise Belling, MD 10/09/20 [phone]

## 2020-10-09 NOTE — ED Notes (Signed)
Patient back from CT.

## 2020-10-09 NOTE — ED Provider Notes (Signed)
[facility] EMERGENCY DEPARTMENT Provider Note   CSN: [id number] Arrival date & time: 10/09/20  0410     History Chief Complaint  Patient presents with   Leg Pain    [name] is a 60 y.o. female.  The history is provided by the patient.  Leg Pain Location:  Leg Injury: no   Leg location:  L lower leg Pain details:    Quality:  Aching, burning and cramping   Radiates to:  Does not radiate   Severity:  Moderate   Onset quality:  Gradual   Duration:  1 day   Timing:  Constant   Progression:  Worsening Chronicity:  New Relieved by:  Nothing Exacerbated by: Movement and palpation. Associated symptoms: no back pain, no fever and no numbness   Associated symptoms comment:  "Tingling" below her left knee Patient with extensive history including TIAs, diabetes presents with left leg pain.  Over the past 24 hours has had increasing pain and burning in the left leg.  She also reports her some increased swelling to the thigh.  No new weakness in the legs.  No new back pain.  She reports there is some burning and numbness below the knee as well.  No previous history of VTE.  She is already on Plavix however.  No recent travel or surgery. No new chest pain or shortness of breath     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 "   Asthma    due to allergies    Cancer ([place])    THRYOID 2016 [place], KIDNEY CANCER MAY 2018  RIGHT RENAL CRYOABLATION   Complication of anesthesia    Diabetes mellitus    TYPE 2   Dyspnea    increased exertion    Dysrhythmia    " irregular heart rate per patient"   Family history of adverse reaction to anesthesia    difficult for father to wake after surgery, AFTHER LOW BP WITH ANESTHESIA   Heart murmur    HOH (hard of hearing)    Hypercholesteremia    Hypertension    PONV (postoperative nausea and vomiting)    Renal cancer, right ([place]) dx'd 07/2018, 10/2018   ablation x  2    Thyroid disease    TIA (transient ischemic attack)    LAST TIA  APRIL 2020 ON PLAVIX, reports residual effects ,02/2020    Patient Active Problem List   Diagnosis Date Noted   TIA (transient ischemic attack) 03/09/2020   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[place]) 11/15/2018   Papillary adenocarcinoma, renal, 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   UTI (urinary tract infection) 06/26/2017   Urinary tract infection 01/24/2017   Abdominal pain 01/21/2017   Stroke-like symptoms 05/16/2016   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 05/16/2016   Fatty liver disease, nonalcoholic 93/81/8299   Slurred speech 09/13/2015   Parotitis 09/13/2015   Gastroenteritis 08/21/2015   Hypothyroid 08/21/2015   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   Pain in the chest    Essential hypertension    Thoracic aortic aneurysm ([place])    Chest pain 04/23/2014   Anginal pain ([place]) 04/23/2014   Type 2 diabetes mellitus ([place])    Hypertension    Hypercholesteremia     Past Surgical History:  Procedure Laterality Date   ABDOMINAL HYSTERECTOMY     COMPLETE  BIOPSY  03/02/2019   Procedure: BIOPSY;  Surgeon: Carol Ada, MD;  Location: [facility];  Service: Endoscopy;;   CHOLECYSTECTOMY     COLON SURGERY     COLONOSCOPY WITH PROPOFOL N/A 03/02/2019   Procedure: COLONOSCOPY WITH PROPOFOL;  Surgeon: Carol Ada, MD;  Location: [facility];  Service: Endoscopy;  Laterality: N/A;   ear surgeries      implantable loop recorder placement  06/02/2020   Medtronic Reveal Linq model LNQ22 implantable loop recorder (SN [id number])    IR RADIOLOGIST EVAL & MGMT  01/12/2017   IR RADIOLOGIST EVAL & MGMT  06/16/2017   IR RADIOLOGIST EVAL & MGMT  08/31/2017   IR RADIOLOGIST EVAL & MGMT  12/13/2017   IR RADIOLOGIST EVAL & MGMT  03/16/2018   IR RADIOLOGIST EVAL & MGMT  11/01/2018   IR RADIOLOGIST EVAL & MGMT  12/13/2018   IR RADIOLOGIST EVAL & MGMT  02/15/2019   IR  RADIOLOGIST EVAL & MGMT  08/16/2019   IR RADIOLOGIST EVAL & MGMT  11/22/2019   IR RADIOLOGIST EVAL & MGMT  06/05/2020   KNEE ARTHROSCOPY     LEFT HEART CATHETERIZATION WITH CORONARY ANGIOGRAM N/A 04/24/2014   Procedure: LEFT HEART CATHETERIZATION WITH CORONARY ANGIOGRAM;  Surgeon: Burnell Blanks, MD;  Location: [facility] CATH LAB;  Service: Cardiovascular;  Laterality: N/A;   POLYPECTOMY  03/02/2019   Procedure: POLYPECTOMY;  Surgeon: Carol Ada, MD;  Location: [facility];  Service: Endoscopy;;   RADIOFREQUENCY ABLATION Right 08/05/2017   Procedure: CT RENAL CRYO AND BIOPSY;  Surgeon: Sandi Mariscal, MD;  Location: [facility] ORS;  Service: Anesthesiology;  Laterality: Right;   RADIOFREQUENCY ABLATION Right 11/15/2018   Procedure: RIGHT RENAL CRYOABLATION;  Surgeon: Sandi Mariscal, MD;  Location: [facility];  Service: Anesthesiology;  Laterality: Right;   THYROIDECTOMY N/A 09/20/2014   Procedure: TOTAL THYROIDECTOMY;  Surgeon: Armandina Gemma, MD;  Location: [facility];  Service: General;  Laterality: N/A;   TONSILLECTOMY       OB History   No obstetric history on file.     Family History  Problem Relation Age of Onset   Heart failure Mother    Diabetes Mother    Stroke Mother    Stroke Father    Heart failure Father    Cancer Father     Social History   Tobacco Use   Smoking status: Never   Smokeless tobacco: Never  Vaping Use   Vaping Use: Never used  Substance Use Topics   Alcohol use: No   Drug use: No    Home Medications Prior to Admission medications   Medication Sig Start Date End Date Taking? Authorizing Provider  acetaminophen (TYLENOL) 500 MG tablet Take 2 tablets (1,000 mg total) by mouth every 6 (six) hours as needed (body aches). 06/29/17   Geradine Girt, DO  albuterol (PROVENTIL HFA;VENTOLIN HFA) 108 (90 Base) MCG/ACT inhaler Inhale 1-2 puffs into the lungs every 6 (six) hours as needed for wheezing or shortness of breath. 12/28/17   Jean Rosenthal, MD  b complex vitamins tablet Take  1 tablet by mouth daily.    [provider]  calcium carbonate (OSCAL) 1500 (600 Ca) MG TABS tablet Take by mouth 2 (two) times daily with a meal. MAGNESIUM AND ZINC 2 IN AM 2 AT 1200 PM 2 AT DINNERTIME    [provider]  clopidogrel (PLAVIX) 75 MG tablet Take 1 tablet (75 mg total) by mouth daily. 09/15/15   Florencia Reasons, MD  diphenhydrAMINE (BENADRYL) 25  MG tablet Take 50 mg by mouth at bedtime.     [provider]  fluticasone (FLONASE) 50 MCG/ACT nasal spray Place 2 sprays into both nostrils daily as needed for allergies. 03/04/20   [provider]  furosemide (LASIX) 40 MG tablet Take 2 tablets (80 mg total) by mouth 2 (two) times daily. 05/17/16   Geradine Girt, DO  ibuprofen (ADVIL) 200 MG tablet Take 800 mg by mouth every 8 (eight) hours as needed for headache or moderate pain.    [provider]  JANUMET 50-1000 MG tablet Take 1 tablet by mouth 2 (two) times daily. 09/16/17   [provider]  levothyroxine (SYNTHROID, LEVOTHROID) 300 MCG tablet Take 300 mcg by mouth daily before breakfast.    [provider]  lisinopril (PRINIVIL,ZESTRIL) 10 MG tablet Take 10 mg by mouth daily.  09/09/14   [provider]  metoprolol tartrate (LOPRESSOR) 100 MG tablet Take 100 mg by mouth 2 (two) times daily. 06/23/16   [provider]  Multiple Vitamin (MULTIVITAMIN PO) Take 1 tablet by mouth daily.    [provider]  omeprazole (PRILOSEC OTC) 20 MG tablet Take 20 mg by mouth daily.    [provider]  ondansetron (ZOFRAN) 8 MG tablet Take 8 mg by mouth every 8 (eight) hours as needed for nausea or vomiting.    [provider]  potassium chloride SA (K-DUR,KLOR-CON) 20 MEQ tablet Take 40 mEq by mouth 2 (two) times daily.    [provider]  pravastatin (PRAVACHOL) 20 MG tablet Take 20 mg by mouth daily.  06/25/13   [provider]  Probiotic Product (PROBIOTIC PO) Take 1 tablet by mouth  daily.    [provider]  zinc gluconate 50 MG tablet Take 50 mg by mouth daily.    [provider]    Allergies    Lorabid [loracarbef], Amoxicillin, Augmentin [amoxicillin-pot clavulanate], Codeine, Gadolinium derivatives, Sulfa drugs cross reactors, Benicar [olmesartan medoxomil], and Other  Review of Systems   Review of Systems  Constitutional:  Negative for fever.  Respiratory:  Negative for shortness of breath.   Cardiovascular:  Positive for leg swelling.  Musculoskeletal:  Positive for arthralgias and myalgias. Negative for back pain.  All other systems reviewed and are negative.  Physical Exam Updated Vital Signs BP (!) 158/82   Pulse 68   Temp (!) 97.5 F (36.4 C) (Temporal)   Resp 18   Ht 1.676 m (5\' 6" )   Wt 108.9 kg   SpO2 100%   BMI 38.74 kg/m   Physical Exam CONSTITUTIONAL: Chronically ill-appearing, appears older than stated age HEAD: Normocephalic/atraumatic EYES: EOMI ENMT: Mucous membranes moist NECK: supple no meningeal signs SPINE/BACK:entire spine nontender CV: S1/S2 noted, no murmurs/rubs/gallops noted LUNGS: Lungs are clear to auscultation bilaterally, no apparent distress ABDOMEN: soft, nontender NEURO: Pt is awake/alert/appropriate, moves all extremitiesx4.    equal distal motor 5/5 strength noted with the following: hip flexion/knee flexion/extension, ankle dorsi/plantar flexion, great toe extension intact bilaterally, no sensory deficit in any dermatome.  EXTREMITIES: pulses normal/equal, full ROM Distal pulses equal and intact.  She has diffuse tenderness to the left medial thigh and left calf.  No erythema.  No crepitus.  No deformities or bruising.  No joint effusions are noted in the left leg. SKIN: warm, color normal PSYCH: no abnormalities of mood noted, alert and oriented to situation  ED Results / Procedures / Treatments   Labs (all labs ordered are listed, but only abnormal  results are displayed) Labs Reviewed   BASIC METABOLIC PANEL - Abnormal; Notable for the following components:      Result Value   Glucose, Bld 218 (*)    Creatinine, Ser 1.30 (*)    Calcium 8.4 (*)    GFR, Estimated 48 (*)    All other components within normal limits  CBC WITH DIFFERENTIAL/PLATELET    EKG None  Radiology No results found.  Procedures Procedures   Medications Ordered in ED Medications  ondansetron (ZOFRAN-ODT) disintegrating tablet 8 mg (8 mg Oral Patient Refused/Not Given 10/09/20 0643)  acetaminophen (TYLENOL) tablet 650 mg (650 mg Oral Given 10/09/20 7493)    ED Course  I have reviewed the triage vital signs and the nursing notes.  Pertinent labs results that were available during my care of the patient were reviewed by me and considered in my medical decision making (see chart for details).    MDM Rules/Calculators/A&P                         7:23 AM Pt with left LE pain/swelling Will obtain DVT study She is already on plavix Signed out to Dr Gilford Raid  Final Clinical Impression(s) / ED Diagnoses Final diagnoses:  None    Rx / DC Orders ED Discharge Orders     None        Ripley Fraise, MD 10/09/20 [phone]

## 2020-10-09 NOTE — ED Notes (Signed)
Patient discharge instructions reviewed with the patient. The patient verbalized understanding of instructions. Patient discharged. 

## 2020-10-09 NOTE — ED Triage Notes (Signed)
Patient presents to ed c/o pain and swelling to left upper thigh onset several days ago. States she is having sharp /burning pain. With numbness and tingling.

## 2020-10-09 NOTE — Progress Notes (Signed)
VASCULAR LAB    Left lower extremity venous duplex has been performed.  See CV proc for preliminary results.  Called Dr. Gilford Raid with results.   Alyza Artiaga, RVT 10/09/2020, 8:37 AM

## 2020-10-09 NOTE — ED Notes (Signed)
Patient transported to CT 

## 2020-10-15 ENCOUNTER — Emergency Department (HOSPITAL_COMMUNITY): Payer: Medicare Other

## 2020-10-15 ENCOUNTER — Observation Stay (HOSPITAL_COMMUNITY)
Admission: EM | Admit: 2020-10-15 | Discharge: 2020-10-16 | Disposition: A | Discharge status: Home / Self Care | Payer: Medicare Other | Attending: Internal Medicine | Admitting: Internal Medicine

## 2020-10-15 ENCOUNTER — Encounter (HOSPITAL_COMMUNITY): Payer: Self-pay

## 2020-10-15 ENCOUNTER — Other Ambulatory Visit: Payer: Self-pay

## 2020-10-15 DIAGNOSIS — R471 Dysarthria and anarthria: Secondary | ICD-10-CM | POA: Insufficient documentation

## 2020-10-15 DIAGNOSIS — J45909 Unspecified asthma, uncomplicated: Secondary | ICD-10-CM | POA: Insufficient documentation

## 2020-10-15 DIAGNOSIS — G459 Transient cerebral ischemic attack, unspecified: Principal | ICD-10-CM | POA: Diagnosis present

## 2020-10-15 DIAGNOSIS — E890 Postprocedural hypothyroidism: Secondary | ICD-10-CM | POA: Diagnosis not present

## 2020-10-15 DIAGNOSIS — Z7902 Long term (current) use of antithrombotics/antiplatelets: Secondary | ICD-10-CM | POA: Insufficient documentation

## 2020-10-15 DIAGNOSIS — R29810 Facial weakness: Secondary | ICD-10-CM | POA: Insufficient documentation

## 2020-10-15 DIAGNOSIS — Z79899 Other long term (current) drug therapy: Secondary | ICD-10-CM | POA: Diagnosis not present

## 2020-10-15 DIAGNOSIS — R531 Weakness: Secondary | ICD-10-CM

## 2020-10-15 DIAGNOSIS — Z85528 Personal history of other malignant neoplasm of kidney: Secondary | ICD-10-CM | POA: Diagnosis not present

## 2020-10-15 DIAGNOSIS — R2990 Unspecified symptoms and signs involving the nervous system: Secondary | ICD-10-CM

## 2020-10-15 DIAGNOSIS — Z7984 Long term (current) use of oral hypoglycemic drugs: Secondary | ICD-10-CM | POA: Insufficient documentation

## 2020-10-15 DIAGNOSIS — I10 Essential (primary) hypertension: Secondary | ICD-10-CM | POA: Insufficient documentation

## 2020-10-15 DIAGNOSIS — E7800 Pure hypercholesterolemia, unspecified: Secondary | ICD-10-CM | POA: Insufficient documentation

## 2020-10-15 DIAGNOSIS — Z8673 Personal history of transient ischemic attack (TIA), and cerebral infarction without residual deficits: Secondary | ICD-10-CM | POA: Insufficient documentation

## 2020-10-15 DIAGNOSIS — Z85850 Personal history of malignant neoplasm of thyroid: Secondary | ICD-10-CM | POA: Insufficient documentation

## 2020-10-15 DIAGNOSIS — E119 Type 2 diabetes mellitus without complications: Secondary | ICD-10-CM | POA: Diagnosis not present

## 2020-10-15 DIAGNOSIS — I2510 Atherosclerotic heart disease of native coronary artery without angina pectoris: Secondary | ICD-10-CM | POA: Diagnosis not present

## 2020-10-15 DIAGNOSIS — Z20822 Contact with and (suspected) exposure to covid-19: Secondary | ICD-10-CM | POA: Insufficient documentation

## 2020-10-15 DIAGNOSIS — Z7901 Long term (current) use of anticoagulants: Secondary | ICD-10-CM | POA: Insufficient documentation

## 2020-10-15 DIAGNOSIS — E039 Hypothyroidism, unspecified: Secondary | ICD-10-CM | POA: Insufficient documentation

## 2020-10-15 LAB — COMPREHENSIVE METABOLIC PANEL
ALT: 18 U/L (ref 0–44)
AST: 18 U/L (ref 15–41)
Albumin: 3.6 g/dL (ref 3.5–5.0)
Alkaline Phosphatase: 58 U/L (ref 38–126)
Anion gap: 11 (ref 5–15)
BUN: 19 mg/dL (ref 6–20)
CO2: 22 mmol/L (ref 22–32)
Calcium: 8.3 mg/dL — ABNORMAL LOW (ref 8.9–10.3)
Chloride: 106 mmol/L (ref 98–111)
Creatinine, Ser: 1.19 mg/dL — ABNORMAL HIGH (ref 0.44–1.00)
GFR, Estimated: 53 mL/min — ABNORMAL LOW (ref >60)
Glucose, Bld: 258 mg/dL — ABNORMAL HIGH (ref 70–99)
Potassium: 3.7 mmol/L (ref 3.5–5.1)
Sodium: 139 mmol/L (ref 135–145)
Total Bilirubin: 0.8 mg/dL (ref 0.3–1.2)
Total Protein: 6.7 g/dL (ref 6.5–8.1)

## 2020-10-15 LAB — RESP PANEL BY RT-PCR (FLU A&B, COVID) ARPGX2
Influenza A by PCR: NEGATIVE
Influenza B by PCR: NEGATIVE
SARS Coronavirus 2 by RT PCR: NEGATIVE

## 2020-10-15 LAB — I-STAT CHEM 8, ED
BUN: 20 mg/dL (ref 6–20)
Calcium, Ion: 1.06 mmol/L — ABNORMAL LOW (ref 1.15–1.40)
Chloride: 105 mmol/L (ref 98–111)
Creatinine, Ser: 1 mg/dL (ref 0.44–1.00)
Glucose, Bld: 250 mg/dL — ABNORMAL HIGH (ref 70–99)
HCT: 39 % (ref 36.0–46.0)
Hemoglobin: 13.3 g/dL (ref 12.0–15.0)
Potassium: 3.7 mmol/L (ref 3.5–5.1)
Sodium: 141 mmol/L (ref 135–145)
TCO2: 24 mmol/L (ref 22–32)

## 2020-10-15 LAB — HEMOGLOBIN A1C
Hgb A1c MFr Bld: 9 % — ABNORMAL HIGH (ref 4.8–5.6)
Mean Plasma Glucose: 211.6 mg/dL

## 2020-10-15 LAB — CBC
HCT: 39.2 % (ref 36.0–46.0)
Hemoglobin: 12.8 g/dL (ref 12.0–15.0)
MCH: 29.7 pg (ref 26.0–34.0)
MCHC: 32.7 g/dL (ref 30.0–36.0)
MCV: 91 fL (ref 80.0–100.0)
Platelets: 237 10*3/uL (ref 150–400)
RBC: 4.31 MIL/uL (ref 3.87–5.11)
RDW: 13.4 % (ref 11.5–15.5)
WBC: 6.7 10*3/uL (ref 4.0–10.5)
nRBC: 0 % (ref 0.0–0.2)

## 2020-10-15 LAB — LIPID PANEL
Cholesterol: 181 mg/dL (ref 0–200)
HDL: 38 mg/dL — ABNORMAL LOW (ref >40)
LDL Cholesterol: 81 mg/dL (ref 0–99)
Total CHOL/HDL Ratio: 4.8 RATIO
Triglycerides: 311 mg/dL — ABNORMAL HIGH (ref <150)
VLDL: 62 mg/dL — ABNORMAL HIGH (ref 0–40)

## 2020-10-15 LAB — DIFFERENTIAL
Abs Immature Granulocytes: 0.03 10*3/uL (ref 0.00–0.07)
Basophils Absolute: 0 10*3/uL (ref 0.0–0.1)
Basophils Relative: 0 %
Eosinophils Absolute: 0.2 10*3/uL (ref 0.0–0.5)
Eosinophils Relative: 2 %
Immature Granulocytes: 0 %
Lymphocytes Relative: 26 %
Lymphs Abs: 1.8 10*3/uL (ref 0.7–4.0)
Monocytes Absolute: 0.5 10*3/uL (ref 0.1–1.0)
Monocytes Relative: 7 %
Neutro Abs: 4.3 10*3/uL (ref 1.7–7.7)
Neutrophils Relative %: 65 %

## 2020-10-15 LAB — I-STAT BETA HCG BLOOD, ED (MC, WL, AP ONLY): I-stat hCG, quantitative: <5 m[IU]/mL (ref <5)

## 2020-10-15 LAB — PROTIME-INR
INR: 1 (ref 0.8–1.2)
Prothrombin Time: 13.3 seconds (ref 11.4–15.2)

## 2020-10-15 LAB — APTT: aPTT: 28 seconds (ref 24–36)

## 2020-10-15 MED ORDER — ASPIRIN EC 81 MG PO TBEC
81.0000 mg | DELAYED_RELEASE_TABLET | Freq: Every day | ORAL | Status: DC
  Administered 2020-10-16: 81 mg via ORAL
  Filled 2020-10-15: qty 1

## 2020-10-15 MED ORDER — ASPIRIN 325 MG PO TABS
325.0000 mg | ORAL_TABLET | Freq: Once | ORAL | Status: AC
  Administered 2020-10-15: 325 mg via ORAL
  Filled 2020-10-15: qty 1

## 2020-10-15 MED ORDER — ACETAMINOPHEN 160 MG/5ML PO SOLN: 650.0000 mg | ORAL | Status: DC | PRN

## 2020-10-15 MED ORDER — ATORVASTATIN CALCIUM 40 MG PO TABS
40.0000 mg | ORAL_TABLET | Freq: Every day | ORAL | Status: DC
  Administered 2020-10-15: 40 mg via ORAL
  Filled 2020-10-15: qty 1

## 2020-10-15 MED ORDER — CLOPIDOGREL BISULFATE 75 MG PO TABS
75.0000 mg | ORAL_TABLET | Freq: Every day | ORAL | Status: DC
  Administered 2020-10-16: 75 mg via ORAL
  Filled 2020-10-15: qty 1

## 2020-10-15 MED ORDER — ACETAMINOPHEN 325 MG PO TABS
650.0000 mg | ORAL_TABLET | ORAL | Status: DC | PRN
  Administered 2020-10-15 – 2020-10-16 (×3): 650 mg via ORAL
  Filled 2020-10-15 (×3): qty 2

## 2020-10-15 MED ORDER — IOHEXOL 350 MG/ML SOLN
100.0000 mL | Freq: Once | INTRAVENOUS | Status: AC | PRN
  Administered 2020-10-15: 100 mL via INTRAVENOUS

## 2020-10-15 MED ORDER — SENNOSIDES-DOCUSATE SODIUM 8.6-50 MG PO TABS: 1.0000 | ORAL_TABLET | Freq: Every evening | ORAL | Status: DC | PRN

## 2020-10-15 MED ORDER — SODIUM CHLORIDE 0.9% FLUSH
3.0000 mL | Freq: Once | INTRAVENOUS | Status: DC
Start: 2020-10-15 — End: 2020-10-16

## 2020-10-15 MED ORDER — STROKE: EARLY STAGES OF RECOVERY BOOK
Freq: Once | Status: DC
  Filled 2020-10-15: qty 1

## 2020-10-15 MED ORDER — ACETAMINOPHEN 650 MG RE SUPP: 650.0000 mg | RECTAL | Status: DC | PRN

## 2020-10-15 MED ORDER — CLOPIDOGREL BISULFATE 300 MG PO TABS
300.0000 mg | ORAL_TABLET | Freq: Once | ORAL | Status: AC
  Administered 2020-10-15: 300 mg via ORAL
  Filled 2020-10-15: qty 1

## 2020-10-15 NOTE — ED Triage Notes (Signed)
Went to bed at 11pm last night woke up at 930 with left sided weakness and in ability to walk.  Patient keeps looking to right but can redirect to left side with instructions.

## 2020-10-15 NOTE — ED Notes (Signed)
Awaiting MRI, all monitoring equipment removed for MRI

## 2020-10-15 NOTE — Code Documentation (Signed)
Stroke Response Nurse Documentation Code Documentation  [name] is a 60 y.o. female arriving to [place] ED via [facility] on 10/15/2020 with past medical hx of HTN, TIA, and Diabetes. Code stroke was activated by ED. Patient from home where she was LKW at 2300 last night when she went to bed. This morning, she woke up at 0500 and spoke to her husband and went back to sleep. At 0930 she woke up and when she got out of bed she noted that she had left sided weakness and slurred speech.    Stroke team at the bedside on patient arrival. Labs drawn and patient cleared for CT by PA. Patient to CT with team. NIHSS 5, see documentation for details and code stroke times. Patient with left arm weakness, left leg weakness, left decreased sensation, and dysarthria  on exam. The following imaging was completed: CT, CTA head and neck, CTP. Patient is not a candidate for tPA due to being outside the window. Patient was not aware of symptoms until she tried to stand up and get out of bed. LKW 2300 when she got in bed.   Care/Plan: q2 mNIHSS/VS, MRI per MD Quinn Axe.   Bedside handoff with ED RN Jinny Blossom.    Kathrin Greathouse  Stroke Response RN

## 2020-10-15 NOTE — H&P (Signed)
Date: 10/15/2020               Patient Name:  [name] MRN: [id number]  DOB: 06/15/1960 Age / Sex: 60 y.o., female   PCP: Lin Landsman, MD         Medical Service: Internal Medicine Teaching Service         Attending Physician: Dr. Sid Falcon, MD    First Contact: Dr. Elliot Gurney Pager: [phone]  Second Contact: Dr. Marianna Payment Pager: [phone]       After Hours (After 5p/  First Contact Pager: [phone]  weekends / holidays): Second Contact Pager: [phone]   Chief Complaint: Left lower extremity weakness, facial droop, and slurred speech  History of Present Illness: [name] is a 60 year old female with a past medical history of type 2 diabetes melitis, hypertension, hyperlipidemia, prior TIAs, thyroid cancer, and renal cell carcinoma who presents to the [facility], ED with symptoms of left-sided weakness and slurred speech.  Patient reports that her last normal state was last night at 10 or 11 PM.  She briefly woke up this morning at 0510 when her husband was getting up to go to work and then drifted back off to sleep.  She states that at 0830 this morning she woke up and felt left-sided weakness in her left lower extremity.  She had difficulty getting out of bed and FaceTime her husband he noticed that the patient was slurring her speech and that the left side of her face was drooping.  She denies dizziness, lightheadedness, fatigue, chest pain, dyspnea, or abdominal pain.  Meds:  Current Meds  Medication Sig   acetaminophen (TYLENOL) 500 MG tablet Take 2 tablets (1,000 mg total) by mouth every 6 (six) hours as needed (body aches). (Patient taking differently: Take 1,000 mg by mouth at bedtime.)   albuterol (PROVENTIL HFA;VENTOLIN HFA) 108 (90 Base) MCG/ACT inhaler Inhale 1-2 puffs into the lungs every 6 (six) hours as needed for wheezing or shortness of breath.   CALCIUM-MAGNESIUM-ZINC PO Take 2 tablets by mouth 2 (two) times daily.   clopidogrel (PLAVIX) 75 MG tablet Take 1  tablet (75 mg total) by mouth daily.   diphenhydrAMINE (BENADRYL) 25 MG tablet Take 25 mg by mouth at bedtime.   fluticasone (FLONASE) 50 MCG/ACT nasal spray Place 2 sprays into both nostrils daily.   furosemide (LASIX) 40 MG tablet Take 2 tablets (80 mg total) by mouth 2 (two) times daily.   ibuprofen (ADVIL) 200 MG tablet Take 800 mg by mouth every 8 (eight) hours as needed for headache or moderate pain.   JANUMET 50-1000 MG tablet Take 1 tablet by mouth 2 (two) times daily.   levothyroxine (SYNTHROID, LEVOTHROID) 300 MCG tablet Take 150-300 mcg by mouth See admin instructions. Take 300 mcg by mouth for 6 days and take 150 mcg on day 7 (Sunday)   lisinopril (PRINIVIL,ZESTRIL) 10 MG tablet Take 10 mg by mouth daily.    meloxicam (MOBIC) 15 MG tablet Take 1 tablet (15 mg total) by mouth daily.   metoprolol tartrate (LOPRESSOR) 100 MG tablet Take 100 mg by mouth 2 (two) times daily.   mupirocin ointment (BACTROBAN) 2 % Apply 1 application topically in the morning and at bedtime. Mole removal site on back   omeprazole (PRILOSEC OTC) 20 MG tablet Take 20 mg by mouth daily.   ondansetron (ZOFRAN ODT) 4 MG disintegrating tablet Take 1 tablet (4 mg total) by mouth every 8 (eight) hours as needed for nausea  or vomiting.   potassium chloride SA (K-DUR,KLOR-CON) 20 MEQ tablet Take 40 mEq by mouth 2 (two) times daily.   pravastatin (PRAVACHOL) 20 MG tablet Take 20 mg by mouth daily.    Probiotic Product (PROBIOTIC PO) Take 1 tablet by mouth daily.   zinc gluconate 50 MG tablet Take 50 mg by mouth daily as needed (flu season).     Allergies: Allergies as of 10/15/2020 - Review Complete 10/15/2020  Allergen Reaction Noted   Lorabid [loracarbef] Anaphylaxis 05/16/2012   Amoxicillin Other (See Comments) 02/12/2019   Augmentin [amoxicillin-pot clavulanate] Hives, Nausea And Vomiting, and Other (See Comments) 09/16/2015   Codeine Nausea And Vomiting 06/04/2011   Gadolinium derivatives Itching, Swelling,  and Other (See Comments) 01/12/2017   Sulfa drugs cross reactors Other (See Comments) 03/16/2011   Benicar [olmesartan medoxomil] Swelling and Rash 03/16/2011   Other Rash 04/12/2012   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 "   Asthma    due to allergies    Cancer ([place])    THRYOID 2016 [place], KIDNEY CANCER MAY 2018  RIGHT RENAL CRYOABLATION   Complication of anesthesia    Diabetes mellitus    TYPE 2   Dyspnea    increased exertion    Dysrhythmia    " irregular heart rate per patient"   Family history of adverse reaction to anesthesia    difficult for father to wake after surgery, AFTHER LOW BP WITH ANESTHESIA   Heart murmur    HOH (hard of hearing)    Hypercholesteremia    Hypertension    PONV (postoperative nausea and vomiting)    Renal cancer, right ([place]) dx'd 07/2018, 10/2018   ablation x 2    Thyroid disease    TIA (transient ischemic attack)    LAST TIA  APRIL 2020 ON PLAVIX, reports residual effects ,02/2020    Family History:  Family History  Problem Relation Age of Onset   Heart failure Mother    Diabetes Mother    Stroke Mother    Stroke Father    Heart failure Father    Cancer Father      Social History:  Social History   Socioeconomic History   Marital status: Married    Spouse name: Glendell Docker   Number of children: 2   Years of education: 14   Highest education level: Not on file  Occupational History    Comment: NA, disabled  Tobacco Use   Smoking status: Never   Smokeless tobacco: Never  Vaping Use   Vaping Use: Never used  Substance and Sexual Activity   Alcohol use: No   Drug use: No   Sexual activity: Not on file  Other Topics Concern   Not on file  Social History Narrative   Lives with husband and son   Social Determinants of Health   Financial Resource Strain: Not on file  Food Insecurity: Not on file  Transportation Needs: Not on file  Physical Activity: Not on file   Stress: Not on file  Social Connections: Not on file  Intimate Partner Violence: Not on file     Review of Systems: A complete ROS was negative except as per HPI.   Physical Exam: Blood pressure 117/77, pulse 83, temperature 98.2 F (36.8 C), temperature source Oral, resp. rate 17, SpO2 98 %. Physical Exam Constitutional:      General: She is  not in acute distress.    Appearance: She is obese. She is not ill-appearing, toxic-appearing or diaphoretic.  Cardiovascular:     Rate and Rhythm: Normal rate and regular rhythm.     Pulses: Normal pulses.     Heart sounds: Normal heart sounds. No murmur heard.   No friction rub. No gallop.  Pulmonary:     Effort: Pulmonary effort is normal.     Breath sounds: Normal breath sounds. No wheezing, rhonchi or rales.  Abdominal:     General: Abdomen is flat. Bowel sounds are normal.     Tenderness: There is no abdominal tenderness.  Musculoskeletal:     Right lower leg: No edema.     Left lower leg: No edema.     Comments: Right upper and lower extremity strength 5/5 Left upper and lower extremity strength 3/5  Skin:    General: Skin is warm.  Neurological:     Mental Status: She is alert and oriented to person, place, and time.     Comments: Decrease sensation on the left side of the face, the left upper extremity, and up to the ankle of the left lower extremity. Cranial nerves II through XII grossly intact with the exception of cranial nerve V with decreased sensation on the left side of face.   Psychiatric:        Mood and Affect: Mood normal.        Behavior: Behavior normal.     EKG: personally reviewed my interpretation is NSR with LA FB, no ischemic changes appreciated.  MR BRAIN WO CONTRAST  Result Date: 10/15/2020 CLINICAL DATA:  Neurological deficit. Acute stroke suspected. Left-sided weakness. Unable to walk. EXAM: MRI HEAD WITHOUT CONTRAST TECHNIQUE: Multiplanar, multiecho pulse sequences of the brain and surrounding  structures were obtained without intravenous contrast. COMPARISON:  Head CT and CT a same day FINDINGS: Brain: Study is abbreviated because the patient developed chest pain. Diffusion imaging does not show any acute or subacute infarction. The brainstem and cerebellum are normal. There are minimal chronic small-vessel ischemic changes of the hemispheric white matter, unchanged since December of last year. No cortical or large vessel territory infarction. No mass lesion, hemorrhage, hydrocephalus or extra-axial collection. Vascular: Major vessels at the base of the brain show flow. Skull and upper cervical spine: Negative Sinuses/Orbits: Clear/normal Other: None IMPRESSION: No acute or significant finding. Mild chronic small-vessel ischemic change of the hemispheric white matter, unchanged since last year. Electronically Signed   By: Nelson Chimes M.D.   On: 10/15/2020 13:30   CT HEAD CODE STROKE WO CONTRAST  Result Date: 10/15/2020 CLINICAL DATA:  Code stroke. Neurological deficit. Acute stroke suspected. EXAM: CT HEAD WITHOUT CONTRAST TECHNIQUE: Contiguous axial images were obtained from the base of the skull through the vertex without intravenous contrast. COMPARISON:  03/09/2020 FINDINGS: Brain: No change. No sign of acute infarction, mass lesion, hemorrhage, hydrocephalus or extra-axial collection. No significant atrophy or appreciable chronic small-vessel changes by CT. Vascular: No abnormal vascular finding. Skull: Normal Sinuses/Orbits: Clear/normal Other: None ASPECTS ([place] Stroke Program Early CT Score) - Ganglionic level infarction (caudate, lentiform nuclei, internal capsule, insula, M1-M3 cortex): 7 - Supraganglionic infarction (M4-M6 cortex): 3 Total score (0-10 with 10 being normal): 10 IMPRESSION: 1. Normal head CT for age. 2. ASPECTS is 10. 3. These results were communicated to Dr. Quinn Axe At 10:41 am on 10/15/2020 by text page via the [facility] messaging system. Electronically Signed   By: Nelson Chimes M.D.   On: 10/15/2020  10:42   CT ANGIO HEAD NECK W WO CM W PERF (CODE STROKE)  Result Date: 10/15/2020 CLINICAL DATA:  Neurological deficit. Acute stroke suspected. Negative head CT. EXAM: CT ANGIOGRAPHY HEAD AND NECK TECHNIQUE: Multidetector CT imaging of the head and neck was performed using the standard protocol during bolus administration of intravenous contrast. Multiplanar CT image reconstructions and MIPs were obtained to evaluate the vascular anatomy. Carotid stenosis measurements (when applicable) are obtained utilizing NASCET criteria, using the distal internal carotid diameter as the denominator. CONTRAST:  12mL OMNIPAQUE IOHEXOL 350 MG/ML SOLN COMPARISON:  Head CT earlier same day. Previous angiography 03/09/2020. FINDINGS: CTA NECK FINDINGS Aortic arch: No aortic atherosclerotic calcification. Coronary artery calcification is noted. Right carotid system: Common carotid artery is tortuous but widely patent to the bifurcation. Carotid bifurcation is normal. Cervical ICA is tortuous but normal otherwise. Left carotid system: Common carotid artery widely patent to the bifurcation. Minimal atherosclerotic calcification at the carotid bifurcation and ICA bulb but no stenosis. Cervical ICA is tortuous but otherwise widely patent. Vertebral arteries: 30-50% stenosis of the right vertebral artery origin. Left vertebral artery origin appears widely patent. Both vertebral arteries widely patent beyond that to the foramen magnum. Skeleton: Ordinary cervical spondylosis. Other neck: No mass or lymphadenopathy.  Previous thyroidectomy. Upper chest: Normal Review of the MIP images confirms the above findings CTA HEAD FINDINGS Anterior circulation: Both internal carotid arteries are patent through the skull base and siphon regions. The anterior and middle cerebral vessels are normal. No large vessel occlusion or correctable proximal stenosis. No aneurysm or vascular malformation. Posterior circulation: Both  vertebral arteries are patent to the basilar. No basilar stenosis. Posterior circulation branch vessels are normal. Venous sinuses: Patent and normal. Anatomic variants: None significant. Review of the MIP images confirms the above findings IMPRESSION: No acute large vessel occlusion. Minimal atherosclerotic change at the left carotid bifurcation but no stenosis. 50% stenosis of the right vertebral artery origin. Posterior circulation appears otherwise normal. These results were communicated to Dr. Quinn Axe At 11:02 am on 10/15/2020 by text page via the [facility] messaging system. Electronically Signed   By: Nelson Chimes M.D.   On: 10/15/2020 11:03     Assessment & Plan by Problem: Active Problems:   TIA (transient ischemic attack) Kimmberly Wisser is a 60 year old female with a past medical history of hypertension, prior TIAs, hyperlipidemia, diabetes, who was brought to the ED with left-sided weakness and slurred speech and being admitted for further work-up for a TIA.  TIA: Patient with a history of TIAs with risk factors of hypertension, diabetes, hyperlipidemia, and family history of strokes presented with left-sided weakness and slurred speech.  On physical examination motor strength the left upper and left lower extremity are 3 out of 5. Decreased sensation of the left face.  CT head and MRI showed no new strokes. CT neck shows 2% stenosis of the right vertebral artery, but is otherwise unremarkable.  Patient was last seen for TIA event in December 2021.  We will continue further work-up at this time. - Appreciate neurology's recommendations - Allow permissive hypertensive for the next 48 hours with BP goal less than 220/110 - Follow-up echocardiogram results - PT,OT,SLP recommendations - DAPT as per loading dose 325 mg and clopidogrel 300 mg per 1 day. - DAPT with aspirin 81 mg and Plavix 75 mg for 21 days, followed by stand-alone aspirin 81 mg daily. - Telemetry - Start Lipitor 40 mg daily for LDL goal  less than 70.   Hypertension:  Patient takes lisinopril 10 mg daily, currently holding home BP medications to allow for hypertensive window.  Systolic pressures on admission were in the 478S later systolic pressure 128.  Type 2 diabetes mellitus: Patient on Janumet 50-1000 twice daily at home.  On admission A1c was found to be 9.0, unsure if this in the setting of noncompliance.  We will start bolus insulin with sliding scale at this time. - Lantus 18 units - SSI  Hyperlipidemia: Recent lipid panel on admission shows LDL of 81 with a goals of decreasing LDL to less than 70.  We will discontinue home pravastatin and start atorvastatin 40 mg daily. - Start atorvastatin 40 mg daily  Renal cell carcinoma: Diagnosed in 2020 cryoablated.  Has follow-up CT December 2022 to reassess for reemergence.  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  Currently takes Synthroid 300 m C g 6 days a week and 150 MCG on day 7. -Continue home dose of Synthroid  Dispo: Admit patient to Observation with expected length of stay less than 2 midnights.  Signed: Maudie Mercury, MD 10/15/2020, 4:41 PM  Pager: [phone] After 5pm on weekdays and 1pm on weekends: On Call pager: [phone]

## 2020-10-15 NOTE — ED Provider Notes (Signed)
[facility] EMERGENCY DEPARTMENT Provider Note   CSN: [id number] Arrival date & time: 10/15/20  1014     History Chief Complaint  Patient presents with   Code Stroke    [name] is a 60 y.o. female with a history of diabetes mellitus, TIA, hypertension, hyperlipidemia, asthma.  Patient presents emerged department with a chief complaint of left-sided weakness.  Patient reports that she first noticed left-sided weakness when waking this morning at 0 930.  Patient states that she went to bed at 2300 last night with no symptoms.  Patient was then woken by her husband at 0 500 this morning and denies any symptoms at that time.  Patient denies any recent traumatic injury.  Patient endorses slurred speech, left-sided weakness.  Patient is following clopidogrel, last took this medication yesterday.  HPI     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 "   Asthma    due to allergies    Cancer ([place])    THRYOID 2016 [place], KIDNEY CANCER MAY 2018  RIGHT RENAL CRYOABLATION   Complication of anesthesia    Diabetes mellitus    TYPE 2   Dyspnea    increased exertion    Dysrhythmia    " irregular heart rate per patient"   Family history of adverse reaction to anesthesia    difficult for father to wake after surgery, AFTHER LOW BP WITH ANESTHESIA   Heart murmur    HOH (hard of hearing)    Hypercholesteremia    Hypertension    PONV (postoperative nausea and vomiting)    Renal cancer, right ([place]) dx'd 07/2018, 10/2018   ablation x 2    Thyroid disease    TIA (transient ischemic attack)    LAST TIA  APRIL 2020 ON PLAVIX, reports residual effects ,02/2020    Patient Active Problem List   Diagnosis Date Noted   TIA (transient ischemic attack) 03/09/2020   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[place]) 11/15/2018   Papillary adenocarcinoma, renal, 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   UTI (urinary tract infection) 06/26/2017    Urinary tract infection 01/24/2017   Abdominal pain 01/21/2017   Stroke-like symptoms 05/16/2016   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 05/16/2016   Fatty liver disease, nonalcoholic 61/95/0932   Slurred speech 09/13/2015   Parotitis 09/13/2015   Gastroenteritis 08/21/2015   Hypothyroid 08/21/2015   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   Pain in the chest    Essential hypertension    Thoracic aortic aneurysm ([place])    Chest pain 04/23/2014   Anginal pain ([place]) 04/23/2014   Type 2 diabetes mellitus ([place])    Hypertension    Hypercholesteremia     Past Surgical History:  Procedure Laterality Date   ABDOMINAL HYSTERECTOMY     COMPLETE   BIOPSY  03/02/2019   Procedure: BIOPSY;  Surgeon: Carol Ada, MD;  Location: [facility];  Service: Endoscopy;;   CHOLECYSTECTOMY     COLON SURGERY     COLONOSCOPY WITH PROPOFOL N/A 03/02/2019   Procedure: COLONOSCOPY WITH PROPOFOL;  Surgeon: Carol Ada, MD;  Location: [facility];  Service: Endoscopy;  Laterality: N/A;   ear surgeries      implantable loop recorder placement  06/02/2020   [employer]tronic Reveal Linq model [id number] implantable loop recorder (SN [id number])    IR RADIOLOGIST EVAL & MGMT  01/12/2017  IR RADIOLOGIST EVAL & MGMT  06/16/2017   IR RADIOLOGIST EVAL & MGMT  08/31/2017   IR RADIOLOGIST EVAL & MGMT  12/13/2017   IR RADIOLOGIST EVAL & MGMT  03/16/2018   IR RADIOLOGIST EVAL & MGMT  11/01/2018   IR RADIOLOGIST EVAL & MGMT  12/13/2018   IR RADIOLOGIST EVAL & MGMT  02/15/2019   IR RADIOLOGIST EVAL & MGMT  08/16/2019   IR RADIOLOGIST EVAL & MGMT  11/22/2019   IR RADIOLOGIST EVAL & MGMT  06/05/2020   KNEE ARTHROSCOPY     LEFT HEART CATHETERIZATION WITH CORONARY ANGIOGRAM N/A 04/24/2014   Procedure: LEFT HEART CATHETERIZATION WITH CORONARY ANGIOGRAM;  Surgeon: Burnell Blanks, MD;  Location: [facility] CATH LAB;  Service: Cardiovascular;  Laterality: N/A;   POLYPECTOMY   03/02/2019   Procedure: POLYPECTOMY;  Surgeon: Carol Ada, MD;  Location: [facility];  Service: Endoscopy;;   RADIOFREQUENCY ABLATION Right 08/05/2017   Procedure: CT RENAL CRYO AND BIOPSY;  Surgeon: Sandi Mariscal, MD;  Location: [facility] ORS;  Service: Anesthesiology;  Laterality: Right;   RADIOFREQUENCY ABLATION Right 11/15/2018   Procedure: RIGHT RENAL CRYOABLATION;  Surgeon: Sandi Mariscal, MD;  Location: [facility];  Service: Anesthesiology;  Laterality: Right;   THYROIDECTOMY N/A 09/20/2014   Procedure: TOTAL THYROIDECTOMY;  Surgeon: Armandina Gemma, MD;  Location: [facility] OR[facility];  Service: General;  Laterality: N/A;   TONSILLECTOMY       OB History   No obstetric history on file.     Family History  Problem Relation Age of Onset   Heart failure Mother    Diabetes Mother    Stroke Mother    Stroke Father    Heart failure Father    Cancer Father     Social History   Tobacco Use   Smoking status: Never   Smokeless tobacco: Never  Vaping Use   Vaping Use: Never used  Substance Use Topics   Alcohol use: No   Drug use: No    Home Medications Prior to Admission medications   Medication Sig Start Date End Date Taking? Authorizing Provider  acetaminophen (TYLENOL) 500 MG tablet Take 2 tablets (1,000 mg total) by mouth every 6 (six) hours as needed (body aches). Patient taking differently: Take 1,000 mg by mouth at bedtime. 06/29/17   Geradine Girt, DO  albuterol (PROVENTIL HFA;VENTOLIN HFA) 108 (90 Base) MCG/ACT inhaler Inhale 1-2 puffs into the lungs every 6 (six) hours as needed for wheezing or shortness of breath. 12/28/17   Jean Rosenthal, MD  CALCIUM-MAGNESIUM-ZINC PO Take 2 tablets by mouth 2 (two) times daily.    [provider]  clopidogrel (PLAVIX) 75 MG tablet Take 1 tablet (75 mg total) by mouth daily. 09/15/15   Florencia Reasons, MD  diphenhydrAMINE (BENADRYL) 25 MG tablet Take 25 mg by mouth at bedtime.    [provider]  fluticasone (FLONASE) 50 MCG/ACT nasal spray Place 2  sprays into both nostrils daily. 03/04/20   [provider]  furosemide (LASIX) 40 MG tablet Take 2 tablets (80 mg total) by mouth 2 (two) times daily. 05/17/16   Geradine Girt, DO  ibuprofen (ADVIL) 200 MG tablet Take 800 mg by mouth every 8 (eight) hours as needed for headache or moderate pain.    [provider]  JANUMET 50-1000 MG tablet Take 1 tablet by mouth 2 (two) times daily. 09/16/17   [provider]  levothyroxine (SYNTHROID, LEVOTHROID) 300 MCG tablet Take 300 mcg by mouth See admin instructions. Take 300  mcg by mouth for 6 days and skip day 7 (Saturday)    [provider]  lisinopril (PRINIVIL,ZESTRIL) 10 MG tablet Take 10 mg by mouth daily.  09/09/14   [provider]  meloxicam (MOBIC) 15 MG tablet Take 1 tablet (15 mg total) by mouth daily. 10/09/20   Isla Pence, MD  metoprolol tartrate (LOPRESSOR) 100 MG tablet Take 100 mg by mouth 2 (two) times daily. 06/23/16   [provider]  mupirocin ointment (BACTROBAN) 2 % Apply 1 application topically in the morning and at bedtime. Mole removal site on back 09/16/20   [provider]  omeprazole (PRILOSEC OTC) 20 MG tablet Take 20 mg by mouth daily.    [provider]  ondansetron (ZOFRAN ODT) 4 MG disintegrating tablet Take 1 tablet (4 mg total) by mouth every 8 (eight) hours as needed for nausea or vomiting. 10/09/20   Isla Pence, MD  ondansetron (ZOFRAN) 8 MG tablet Take 8 mg by mouth every 8 (eight) hours as needed for nausea or vomiting.    [provider]  potassium chloride SA (K-DUR,KLOR-CON) 20 MEQ tablet Take 40 mEq by mouth 2 (two) times daily.    [provider]  pravastatin (PRAVACHOL) 20 MG tablet Take 20 mg by mouth daily.  06/25/13   [provider]  Probiotic Product (PROBIOTIC PO) Take 1 tablet by mouth daily.    [provider]  zinc gluconate 50 MG tablet Take 50 mg by mouth daily as needed (flu season).     [provider]    Allergies    Lorabid [loracarbef], Amoxicillin, Augmentin [amoxicillin-pot clavulanate], Codeine, Gadolinium derivatives, Sulfa drugs cross reactors, Benicar [olmesartan medoxomil], and Other  Review of Systems   Review of Systems  Constitutional:  Negative for chills and fever.  Eyes:  Negative for visual disturbance.  Respiratory:  Negative for shortness of breath.   Cardiovascular:  Negative for chest pain.  Gastrointestinal:  Negative for abdominal pain, nausea and vomiting.  Genitourinary:  Negative for difficulty urinating and dysuria.  Musculoskeletal:  Negative for back pain and neck pain.  Skin:  Negative for color change and rash.  Neurological:  Positive for facial asymmetry, speech difficulty and weakness. Negative for dizziness, tremors, seizures, syncope, light-headedness, numbness and headaches.  Psychiatric/Behavioral:  Negative for confusion.    Physical Exam Updated Vital Signs There were no vitals taken for this visit.  Physical Exam Vitals and nursing note reviewed.  Constitutional:      General: She is not in acute distress.    Appearance: She is not ill-appearing, toxic-appearing or diaphoretic.  Eyes:     General: No scleral icterus.       Right eye: No discharge.        Left eye: No discharge.     Extraocular Movements: Extraocular movements intact.     Conjunctiva/sclera: Conjunctivae normal.     Pupils: Pupils are equal, round, and reactive to light.  Cardiovascular:     Rate and Rhythm: Normal rate.  Pulmonary:     Effort: Pulmonary effort is normal. No respiratory distress.  Musculoskeletal:     Cervical back: Normal range of motion and neck supple. No rigidity.  Skin:    General: Skin is warm and dry.  Neurological:     General: No focal deficit present.     Mental Status: She is alert and oriented to person, place, and time.     GCS: GCS eye subscore is 4. GCS verbal subscore is 5.  GCS motor subscore is 6.      Cranial Nerves: Facial asymmetry present.     Sensory: Sensation is intact.     Motor: Weakness and pronator drift present. No tremor or seizure activity.     Coordination: Romberg sign negative. Finger-Nose-Finger Test normal.     Gait: Gait abnormal.     Comments: Exam performed while patient was in supine position.  Left-sided facial droop, slurred speech present, 4/5 strength to left upper and lower extremity, 5/5 strength to right upper and lower extremity.  Left-sided pronator drift.  Sensation to light touch intact to bilateral upper and lower extremities.  Psychiatric:        Behavior: Behavior is cooperative.    ED Results / Procedures / Treatments   Labs (all labs ordered are listed, but only abnormal results are displayed) Labs Reviewed  COMPREHENSIVE METABOLIC PANEL - Abnormal; Notable for the following components:      Result Value   Glucose, Bld 258 (*)    Creatinine, Ser 1.19 (*)    Calcium 8.3 (*)    GFR, Estimated 53 (*)    All other components within normal limits  HEMOGLOBIN A1C - Abnormal; Notable for the following components:   Hgb A1c MFr Bld 9.0 (*)    All other components within normal limits  I-STAT CHEM 8, ED - Abnormal; Notable for the following components:   Glucose, Bld 250 (*)    Calcium, Ion 1.06 (*)    All other components within normal limits  PROTIME-INR  APTT  CBC  DIFFERENTIAL  LIPID PANEL  I-STAT BETA HCG BLOOD, ED ([facility], [facility], [facility] ONLY)  CBG MONITORING, ED    EKG None  Radiology CT HEAD CODE STROKE WO CONTRAST  Result Date: 10/15/2020 CLINICAL DATA:  Code stroke. Neurological deficit. Acute stroke suspected. EXAM: CT HEAD WITHOUT CONTRAST TECHNIQUE: Contiguous axial images were obtained from the base of the skull through the vertex without intravenous contrast. COMPARISON:  03/09/2020 FINDINGS: Brain: No change. No sign of acute infarction, mass lesion, hemorrhage, hydrocephalus or extra-axial collection. No significant atrophy or  appreciable chronic small-vessel changes by CT. Vascular: No abnormal vascular finding. Skull: Normal Sinuses/Orbits: Clear/normal Other: None ASPECTS ([place] Stroke Program Early CT Score) - Ganglionic level infarction (caudate, lentiform nuclei, internal capsule, insula, M1-M3 cortex): 7 - Supraganglionic infarction (M4-M6 cortex): 3 Total score (0-10 with 10 being normal): 10 IMPRESSION: 1. Normal head CT for age. 2. ASPECTS is 10. 3. These results were communicated to Dr. Quinn Axe At 10:41 am on 10/15/2020 by text page via the [facility] messaging system. Electronically Signed   By: Nelson Chimes M.D.   On: 10/15/2020 10:42   CT ANGIO HEAD NECK W WO CM W PERF (CODE STROKE)  Result Date: 10/15/2020 CLINICAL DATA:  Neurological deficit. Acute stroke suspected. Negative head CT. EXAM: CT ANGIOGRAPHY HEAD AND NECK TECHNIQUE: Multidetector CT imaging of the head and neck was performed using the standard protocol during bolus administration of intravenous contrast. Multiplanar CT image reconstructions and MIPs were obtained to evaluate the vascular anatomy. Carotid stenosis measurements (when applicable) are obtained utilizing NASCET criteria, using the distal internal carotid diameter as the denominator. CONTRAST:  154mL OMNIPAQUE IOHEXOL 350 MG/ML SOLN COMPARISON:  Head CT earlier same day. Previous angiography 03/09/2020. FINDINGS: CTA NECK FINDINGS Aortic arch: No aortic atherosclerotic calcification. Coronary artery calcification is noted. Right carotid system: Common carotid artery is tortuous but widely patent to the bifurcation. Carotid bifurcation is normal. Cervical ICA is tortuous but normal  otherwise. Left carotid system: Common carotid artery widely patent to the bifurcation. Minimal atherosclerotic calcification at the carotid bifurcation and ICA bulb but no stenosis. Cervical ICA is tortuous but otherwise widely patent. Vertebral arteries: 30-50% stenosis of the right vertebral artery origin. Left vertebral  artery origin appears widely patent. Both vertebral arteries widely patent beyond that to the foramen magnum. Skeleton: Ordinary cervical spondylosis. Other neck: No mass or lymphadenopathy.  Previous thyroidectomy. Upper chest: Normal Review of the MIP images confirms the above findings CTA HEAD FINDINGS Anterior circulation: Both internal carotid arteries are patent through the skull base and siphon regions. The anterior and middle cerebral vessels are normal. No large vessel occlusion or correctable proximal stenosis. No aneurysm or vascular malformation. Posterior circulation: Both vertebral arteries are patent to the basilar. No basilar stenosis. Posterior circulation branch vessels are normal. Venous sinuses: Patent and normal. Anatomic variants: None significant. Review of the MIP images confirms the above findings IMPRESSION: No acute large vessel occlusion. Minimal atherosclerotic change at the left carotid bifurcation but no stenosis. 50% stenosis of the right vertebral artery origin. Posterior circulation appears otherwise normal. These results were communicated to Dr. Quinn Axe At 11:02 am on 10/15/2020 by text page via the [facility] messaging system. Electronically Signed   By: Nelson Chimes M.D.   On: 10/15/2020 11:03    Procedures .Critical Care  Date/Time: 10/15/2020 2:15 PM Performed by: Loni Beckwith, PA-C Authorized by: Loni Beckwith, PA-C   Critical care provider statement:    Critical care time (minutes):  45   Critical care was necessary to treat or prevent imminent or life-threatening deterioration of the following conditions:  CNS failure or compromise   Critical care was time spent personally by me on the following activities:  Discussions with consultants, evaluation of patient's response to treatment, examination of patient, ordering and review of laboratory studies, ordering and review of radiographic studies, pulse oximetry, re-evaluation of patient's condition, obtaining  history from patient or surrogate and review of old charts   Care discussed with: admitting provider     Medications Ordered in ED Medications  sodium chloride flush (NS) 0.9 % injection 3 mL (3 mLs Intravenous Not Given 10/15/20 1133)  clopidogrel (PLAVIX) tablet 300 mg (300 mg Oral Given 10/15/20 1132)    Followed by  clopidogrel (PLAVIX) tablet 75 mg (has no administration in time range)  aspirin EC tablet 81 mg (has no administration in time range)  iohexol (OMNIPAQUE) 350 MG/ML injection 100 mL (100 mLs Intravenous Contrast Given 10/15/20 1054)  aspirin tablet 325 mg (325 mg Oral Given 10/15/20 1131)    ED Course  I have reviewed the triage vital signs and the nursing notes.  Pertinent labs & imaging results that were available during my care of the patient were reviewed by me and considered in my medical decision making (see chart for details).    MDM Rules/Calculators/A&P                           Alert 60 year old female no acute distress, nontoxic-appearing.  Patient's with a chief complaint of left-sided weakness.  Patient reports last normal 0500, woke up at 0930 and noticed left-sided weakness.    Code stroke was activated.  Upon patient's arrival to emergency department with EMS.  Exam performed while patient was in supine position.  Left-sided facial droop, slurred speech present, 4/5 strength to left upper and lower extremity, 5/5 strength to right upper and lower  extremity.  Left-sided pronator drift.  Sensation to light touch intact to bilateral upper and lower extremities.  Patient was directly discussed with neurologist Dr. Quinn Axe.  Noncontrast head CT.  No sign of acute infarction, mass lesion, hemorrhage, hydrocephalus, or extra-axial collection.  CTA shows no acute large vessel occlusion.  71 Dr. Quinn Axe advised patient to be admitted to hospitalist service for work-up.    [place] to hospitalist Dr. Gilford Rile who will see the patient for admission.    Final  Clinical Impression(s) / ED Diagnoses Final diagnoses:  Left-sided weakness    Rx / DC Orders ED Discharge Orders     None        Loni Beckwith, PA-C 10/15/20 1416    Dorie Rank, MD 10/17/20 [phone]

## 2020-10-15 NOTE — Evaluation (Signed)
Physical Therapy Evaluation Patient Details Name: [name] MRN: [id number] DOB: Aug 22, 1960 Today's Date: 10/15/2020   History of Present Illness  60 y.o. female presents to [facility] ED on 10/15/2020 with reports of L weakness. MRI and CT negative. PMHx previous history of CVA, renal cell CA, HTN, DM 2.  Clinical Impression  Pt presents to PT with deficits in gait, balance, strength, power, and sensation. Pt demonstrates the ability to perform household mobility without physical assistance, although with gait deviations compared to baseline as documented below. Pt will benefit from continued acute PT services to improve balance and restore independence. PT recommends discharge home with outpatient PT and a RW (pt and daughter requesting walker to improve safety due to falls history).    Follow Up Recommendations Outpatient PT    Equipment Recommendations  Rolling walker with 5" wheels    Recommendations for Other Services       Precautions / Restrictions Precautions Precautions: Fall Restrictions Weight Bearing Restrictions: No      Mobility  Bed Mobility Overal bed mobility: Independent                  Transfers Overall transfer level: Independent                  Ambulation/Gait Ambulation/Gait assistance: Supervision Gait Distance (Feet): 150 Feet Assistive device: None Gait Pattern/deviations: Step-through pattern Gait velocity: functional Gait velocity interpretation: 1.31 - 2.62 ft/sec, indicative of limited community ambulator General Gait Details: pt with slowed step-through gait, initially with reduced stance time on LLE. Pt progresses to increased gait speed, accepting dynamic gait challenges including head turns, stopping abruptly, turns, and changing step length  Stairs            Wheelchair Mobility    Modified Rankin (Stroke Patients Only) Modified Rankin (Stroke Patients Only) Pre-Morbid Rankin Score: No symptoms Modified Rankin:  Slight disability     Balance Overall balance assessment: Mild deficits observed, not formally tested                                           Pertinent Vitals/Pain Pain Assessment: No/denies pain    Home Living Family/patient expects to be discharged to:: Private residence Living Arrangements: Spouse/significant other Available Help at Discharge: Family;Available PRN/intermittently Type of Home: House Home Access: Stairs to enter Entrance Stairs-Rails: None Entrance Stairs-Number of Steps: 2 Home Layout: One level Home Equipment: Cane - single point      Prior Function Level of Independence: Independent         Comments: cares for 54 year old grandchild, does report a historty of falls     Hand Dominance   Dominant Hand: Right    Extremity/Trunk Assessment   Upper Extremity Assessment Upper Extremity Assessment: LUE deficits/detail LUE Deficits / Details: grossly 4/5 LUE Sensation: decreased light touch    Lower Extremity Assessment Lower Extremity Assessment: LLE deficits/detail LLE Deficits / Details: grossly 4/5 LLE Sensation: decreased light touch    Cervical / Trunk Assessment Cervical / Trunk Assessment: Normal  Communication   Communication: No difficulties  Cognition Arousal/Alertness: Awake/alert Behavior During Therapy: WFL for tasks assessed/performed Overall Cognitive Status: Within Functional Limits for tasks assessed  General Comments General comments (skin integrity, edema, etc.): VSS on RA    Exercises     Assessment/Plan    PT Assessment Patient needs continued PT services  PT Problem List Decreased balance;Decreased strength;Decreased mobility       PT Treatment Interventions DME instruction;Gait training;Stair training;Functional mobility training;Therapeutic activities;Balance training;Neuromuscular re-education;Patient/family education    PT Goals  (Current goals can be found in the Care Plan section)  Acute Rehab PT Goals Patient Stated Goal: to return to independence and reduce falls risk PT Goal Formulation: With patient/family Time For Goal Achievement: 10/29/20 Potential to Achieve Goals: Good Additional Goals Additional Goal #1: Pt will score >19/24 on DGI to indicate a reduced risk for falls    Frequency Min 4X/week   Barriers to discharge        Co-evaluation               AM-PAC PT "6 Clicks" Mobility  Outcome Measure Help needed turning from your back to your side while in a flat bed without using bedrails?: None Help needed moving from lying on your back to sitting on the side of a flat bed without using bedrails?: None Help needed moving to and from a bed to a chair (including a wheelchair)?: None Help needed standing up from a chair using your arms (e.g., wheelchair or bedside chair)?: None Help needed to walk in hospital room?: A Little Help needed climbing 3-5 steps with a railing? : A Little 6 Click Score: 22    End of Session   Activity Tolerance: Patient tolerated treatment well Patient left: in bed;with call bell/phone within reach;with family/visitor present Nurse Communication: Mobility status PT Visit Diagnosis: Other abnormalities of gait and mobility (R26.89)    Time: 7412-8786 PT Time Calculation (min) (ACUTE ONLY): 25 min   Charges:   PT Evaluation $PT Eval Low Complexity: 1 Low          Zenaida Niece, PT, DPT Acute Rehabilitation Pager: [phone]   Zenaida Niece 10/15/2020, 6:10 PM

## 2020-10-15 NOTE — ED Notes (Signed)
PT at bedside to evaluate

## 2020-10-15 NOTE — Consult Note (Signed)
Neurology Consultation  Reason for Consult: Left-sided weakness, left facial droop Referring Physician: Dr. Tomi Bamberger  CC: Left-sided weakness  History is obtained from: Patient, bedside RN, Chart review  HPI: [name] is a 60 y.o. female with a medical history significant for type 2 diabetes mellitus, hypertension, hyperlipidemia, obesity- BMI 38.74, TIAs on clopidogrel, thyroid cancer in 2018, and renal cancer s/p cryoablation in 2018 who presented to the ED via EMS from home for evaluation of left-sided weakness. Patient states that she went to bed last night at 23:00 without weakness and when she got out of bed this morning at 08:30, she noted left-sided weakness and activated EMS for further evaluation.   Per chart review OT noted 03/28/2020 that at baseline, [name] ambulates mostly independently but rarely uses a cane. She is able to complete all ADLs independently but her husband does assist with tub transfers to ensure patient safety.   LKW: 10/14/2020 23:00 tpa given?: no, patient outside of thrombolytic therapy window IR Thrombectomy? No, vessel imaging reviewed without evidence supporting LVO. Presentation is not consistent with LVO. Modified Rankin Scale: 1-No significant post stroke disability and can perform usual duties with stroke symptoms  ROS: A complete ROS was performed and is negative except as noted in the HPI.   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 "   Asthma    due to allergies    Cancer ([place])    THRYOID 2016 [place], KIDNEY CANCER MAY 2018  RIGHT RENAL CRYOABLATION   Complication of anesthesia    Diabetes mellitus    TYPE 2   Dyspnea    increased exertion    Dysrhythmia    " irregular heart rate per patient"   Family history of adverse reaction to anesthesia    difficult for father to wake after surgery, AFTHER LOW BP WITH ANESTHESIA   Heart murmur    HOH (hard of hearing)     Hypercholesteremia    Hypertension    PONV (postoperative nausea and vomiting)    Renal cancer, right ([place]) dx'd 07/2018, 10/2018   ablation x 2    Thyroid disease    TIA (transient ischemic attack)    LAST TIA  APRIL 2020 ON PLAVIX, reports residual effects ,02/2020   Past Surgical History:  Procedure Laterality Date   ABDOMINAL HYSTERECTOMY     COMPLETE   BIOPSY  03/02/2019   Procedure: BIOPSY;  Surgeon: Carol Ada, MD;  Location: [facility];  Service: Endoscopy;;   CHOLECYSTECTOMY     COLON SURGERY     COLONOSCOPY WITH PROPOFOL N/A 03/02/2019   Procedure: COLONOSCOPY WITH PROPOFOL;  Surgeon: Carol Ada, MD;  Location: [facility];  Service: Endoscopy;  Laterality: N/A;   ear surgeries      implantable loop recorder placement  06/02/2020   [employer] Reveal Linq model [id number] implantable loop recorder (SN [id number])    IR RADIOLOGIST EVAL & MGMT  01/12/2017   IR RADIOLOGIST EVAL & MGMT  06/16/2017   IR RADIOLOGIST EVAL & MGMT  08/31/2017   IR RADIOLOGIST EVAL & MGMT  12/13/2017   IR RADIOLOGIST EVAL & MGMT  03/16/2018   IR RADIOLOGIST EVAL & MGMT  11/01/2018   IR RADIOLOGIST EVAL & MGMT  12/13/2018   IR RADIOLOGIST EVAL & MGMT  02/15/2019   IR RADIOLOGIST EVAL & MGMT  08/16/2019   IR RADIOLOGIST EVAL & MGMT  11/22/2019   IR RADIOLOGIST EVAL & MGMT  06/05/2020   KNEE ARTHROSCOPY     LEFT HEART CATHETERIZATION WITH CORONARY ANGIOGRAM N/A 04/24/2014   Procedure: LEFT HEART CATHETERIZATION WITH CORONARY ANGIOGRAM;  Surgeon: Burnell Blanks, MD;  Location: [facility];  Service: Cardiovascular;  Laterality: N/A;   POLYPECTOMY  03/02/2019   Procedure: POLYPECTOMY;  Surgeon: Carol Ada, MD;  Location: [facility];  Service: Endoscopy;;   RADIOFREQUENCY ABLATION Right 08/05/2017   Procedure: CT RENAL CRYO AND BIOPSY;  Surgeon: Sandi Mariscal, MD;  Location: [facility];  Service: Anesthesiology;  Laterality: Right;   RADIOFREQUENCY ABLATION Right 11/15/2018   Procedure: RIGHT RENAL  CRYOABLATION;  Surgeon: Sandi Mariscal, MD;  Location: [facility];  Service: Anesthesiology;  Laterality: Right;   THYROIDECTOMY N/A 09/20/2014   Procedure: TOTAL THYROIDECTOMY;  Surgeon: Armandina Gemma, MD;  Location: [facility];  Service: General;  Laterality: N/A;   TONSILLECTOMY     Family History  Problem Relation Age of Onset   Heart failure Mother    Diabetes Mother    Stroke Mother    Stroke Father    Heart failure Father    Cancer Father    Social History:   reports that she has never smoked. She has never used smokeless tobacco. She reports that she does not drink alcohol and does not use drugs.  Medications  Current Facility-Administered Medications:    sodium chloride flush (NS) 0.9 % injection 3 mL, 3 mL, Intravenous, Once, Denise Rank, MD  Current Outpatient Medications:    acetaminophen (TYLENOL) 500 MG tablet, Take 2 tablets (1,000 mg total) by mouth every 6 (six) hours as needed (body aches). (Patient taking differently: Take 1,000 mg by mouth at bedtime.), Disp: 30 tablet, Rfl: 0   albuterol (PROVENTIL HFA;VENTOLIN HFA) 108 (90 Base) MCG/ACT inhaler, Inhale 1-2 puffs into the lungs every 6 (six) hours as needed for wheezing or shortness of breath., Disp: 1 Inhaler, Rfl: 0   CALCIUM-MAGNESIUM-ZINC PO, Take 2 tablets by mouth 2 (two) times daily., Disp: , Rfl:    clopidogrel (PLAVIX) 75 MG tablet, Take 1 tablet (75 mg total) by mouth daily., Disp: 30 tablet, Rfl: 0   diphenhydrAMINE (BENADRYL) 25 MG tablet, Take 25 mg by mouth at bedtime., Disp: , Rfl:    fluticasone (FLONASE) 50 MCG/ACT nasal spray, Place 2 sprays into both nostrils daily., Disp: , Rfl:    furosemide (LASIX) 40 MG tablet, Take 2 tablets (80 mg total) by mouth 2 (two) times daily., Disp: , Rfl:    ibuprofen (ADVIL) 200 MG tablet, Take 800 mg by mouth every 8 (eight) hours as needed for headache or moderate pain., Disp: , Rfl:    JANUMET 50-1000 MG tablet, Take 1 tablet by mouth 2 (two) times daily., Disp: , Rfl: 3    levothyroxine (SYNTHROID, LEVOTHROID) 300 MCG tablet, Take 300 mcg by mouth See admin instructions. Take 300 mcg by mouth for 6 days and skip day 7 (Saturday), Disp: , Rfl:    lisinopril (PRINIVIL,ZESTRIL) 10 MG tablet, Take 10 mg by mouth daily. , Disp: , Rfl: 5   meloxicam (MOBIC) 15 MG tablet, Take 1 tablet (15 mg total) by mouth daily., Disp: 30 tablet, Rfl: 0   metoprolol tartrate (LOPRESSOR) 100 MG tablet, Take 100 mg by mouth 2 (two) times daily., Disp: , Rfl:    mupirocin ointment (BACTROBAN) 2 %, Apply 1 application topically in the morning and at bedtime. Mole removal site on back, Disp: , Rfl:  omeprazole (PRILOSEC OTC) 20 MG tablet, Take 20 mg by mouth daily., Disp: , Rfl:    ondansetron (ZOFRAN ODT) 4 MG disintegrating tablet, Take 1 tablet (4 mg total) by mouth every 8 (eight) hours as needed for nausea or vomiting., Disp: 10 tablet, Rfl: 0   ondansetron (ZOFRAN) 8 MG tablet, Take 8 mg by mouth every 8 (eight) hours as needed for nausea or vomiting., Disp: , Rfl:    potassium chloride SA (K-DUR,KLOR-CON) 20 MEQ tablet, Take 40 mEq by mouth 2 (two) times daily., Disp: , Rfl:    pravastatin (PRAVACHOL) 20 MG tablet, Take 20 mg by mouth daily. , Disp: , Rfl:    Probiotic Product (PROBIOTIC PO), Take 1 tablet by mouth daily., Disp: , Rfl:    zinc gluconate 50 MG tablet, Take 50 mg by mouth daily as needed (flu season)., Disp: , Rfl:   Exam: Current vital signs: BP (!) 150/76   Pulse 73   Temp 98.2 F (36.8 C) (Oral)   Resp 17   SpO2 100%  Vital signs in last 24 hours: Temp:  [98.2 F (36.8 C)] 98.2 F (36.8 C) (07/20 1046) Pulse Rate:  [73] 73 (07/20 1045) Resp:  [17-21] 17 (07/20 1045) BP: (150-153)/(76-83) 150/76 (07/20 1045) SpO2:  [99 %-100 %] 100 % (07/20 1046)  GENERAL: Awake, alert, in no acute distress Psych: Affect appropriate for situation, calm and cooperative with examination Head: Normocephalic and atraumatic, without obvious abnormality EENT: Normal  conjunctivae, no OP obstruction, dry mucous membranes LUNGS: Normal respiratory effort. Non-labored breathing on room air CV:  Regular rate and rhythm on cardiac monitor ABDOMEN: Soft, rounded, non-tender Ext: warm, well perfused, without obvious deformity  NEURO:  Mental Status: Awake, alert, and oriented to person, place, age, month, year, and situation.  She is able to provide details regarding history of present illness.  Speech is mildly dysarthric.  Naming, repetition, fluency, and comprehension intact without aphasia. No neglect is noted Cranial Nerves:  II: PERRL 4 mm/brisk. Visual fields full.  III, IV, VI: EOMI without nystagmus, ptosis, or gaze palsy V: Sensation is intact to light touch on face with decreased sensation to light touch reported on the left face. VII: Face is symmetric resting and smiling (within the limitation of minimal effort with smiling, puffing cheeks) VIII: Hearing is intact to voice IX, X: Palate elevation is symmetric. Phonation normal.  XI: Normal sternocleidomastoid and trapezius muscle strength XII: Tongue protrudes midline without fasciculations.   Motor: Effort limited assessment with varying degrees of left upper and lower extremity weakness Left upper extremity 4/5 strength with minimal pronator drift, left lower extremity with 3-4/5 strength with drift versus inability to resist gravity intermittently throughout assessment.  5/5 strength noted on the right upper and lower extremities.  Tone is normal. Bulk is normal.  Sensation: Decreased sensation to light touch on the left face, left upper extremity, and left lower extremity Coordination: FTN intact bilaterally. HKS intact on the right with slowed movements consistent with LLE weakness without ataxia on the left.   DTRs: 2+ and symmetric bilateral biceps Gait: Deferred  NIHSS: 1a Level of Conscious.: 0 1b LOC Questions: 0 1c LOC Commands: 0 2 Best Gaze: 0 3 Visual: 0 4 Facial Palsy:  0 5a Motor Arm - left: 1 5b Motor Arm - Right: 0 6a Motor Leg - Left: 2 6b Motor Leg - Right: 0 7 Limb Ataxia: 0 8 Sensory: 1 9 Best Language: 0 10 Dysarthria: 1 11 Extinct. and Inatten.: 0  TOTAL: 5  Labs I have reviewed labs in epic and the results pertinent to this consultation are: CBC    Component Value Date/Time   WBC 6.7 10/15/2020 1025   RBC 4.31 10/15/2020 1025   HGB 13.3 10/15/2020 1032   HCT 39.0 10/15/2020 1032   PLT 237 10/15/2020 1025   MCV 91.0 10/15/2020 1025   MCH 29.7 10/15/2020 1025   MCHC 32.7 10/15/2020 1025   RDW 13.4 10/15/2020 1025   LYMPHSABS 1.8 10/15/2020 1025   MONOABS 0.5 10/15/2020 1025   EOSABS 0.2 10/15/2020 1025   BASOSABS 0.0 10/15/2020 1025   CMP     Component Value Date/Time   NA 141 10/15/2020 1032   K 3.7 10/15/2020 1032   CL 105 10/15/2020 1032   CO2 24 10/09/2020 0427   GLUCOSE 250 (H) 10/15/2020 1032   BUN 20 10/15/2020 1032   CREATININE 1.00 10/15/2020 1032   CREATININE 1.15 (H) 12/11/2018 0751   CALCIUM 8.4 (L) 10/09/2020 0427   PROT 6.8 03/09/2020 0556   ALBUMIN 3.6 03/09/2020 0556   AST 21 03/09/2020 0556   ALT 22 03/09/2020 0556   ALKPHOS 57 03/09/2020 0556   BILITOT 0.5 03/09/2020 0556   GFRNONAA 48 (L) 10/09/2020 0427   GFRNONAA 53 (L) 12/11/2018 0751   GFRAA 62 12/11/2018 0751   Lipid Panel     Component Value Date/Time   CHOL 160 03/10/2020 0306   TRIG 345 (H) 03/10/2020 0306   HDL 37 (L) 03/10/2020 0306   CHOLHDL 4.3 03/10/2020 0306   VLDL 69 (H) 03/10/2020 0306   LDLCALC 54 03/10/2020 0306   Lab Results  Component Value Date   HGBA1C 8.4 (H) 03/10/2020   Imaging I have reviewed the images obtained:  CT-scan of the brain 10/15/2020: 1. Normal head CT for age. 2. ASPECTS is 10.  CT Angio Head and Neck WWO with CT Cerebral Perfusion 10/15/2020: No acute large vessel occlusion. Minimal atherosclerotic change at the left carotid bifurcation but no stenosis. 50% stenosis of the right vertebral  artery origin. Posterior circulation appears otherwise normal.  MRI examination of the brain pending  Assessment: 60 y.o. female who presented to the ED for evaluation of left-sided weakness, left-sided sensory deficits, and mild dysarthria since waking up at 08:30 this morning.  - Examination reveals patient with effort limited assessment with left lower extremity > left upper extremity weakness, left-sided sensory deficits, and mild dysarthria with an initial NIHSS of 5.  - tPA not given due to patient's last known well last night at 23:00. CT angio imaging complete for evaluation of intracranial vessels with persistent left-sided weakness and sensory deficits. CT perfusion imaging complete due to LKW time outside of thrombolytic therapy window but within the window for IR if LVO / significant penumbra identified on imaging. Further CT imaging without evidence supporting LVO, therefore IR was not activated. - Presentation most consistent with small ischemic stroke versus functional weakness with varying degrees of weakness within the limitation of an effort dependent examination. Will complete stroke work up for further evaluation.   Impression: Left upper and lower extremity weakness Left hemibody decreased sensation to light touch  Concern for ischemic stroke versus functional weakness  History of transient ischemic attacks on home clopidogrel  Recommendations: - Permissive HTN x48 hrs from sx onset or until stroke ruled out by MRI goal BP <220/110. PRN labetalol or hydralazine if BP above these parameters. Avoid oral antihypertensives. - MRI brain without contrast - HgbA1c, fasting lipid panel +  statin per guidelines - Frequent neuro checks - Echocardiogram - Prophylactic therapy- Antiplatelet med: DAPT Aspirin - dose 325mg  PO or 300mg  PR plus clopidogrel 300 mg loading doses followed by ASA 81 mg PO daily together with clopidogrel 75 mg PO daily for 21 days  - Risk factor modification -  Telemetry monitoring - PT consult, OT consult, Speech consult  Stroke team will continue to follow.  Anibal Henderson, AGAC-NP [facility] Pager: [phone]   Neurology Attending Attestation   I examined the patient and discussed plan with Ms. Toberman NP. Above note has been edited by me to reflect my findings and recommendations. I was present throughout the stroke code and made all significant decisions and personally reviewed CNS imaging and also discussed CTA results with radiologist by phone.    This patient is critically ill and at significant risk of neurological worsening, death and care requires constant monitoring of vital signs, hemodynamics,respiratory and cardiac monitoring, neurological assessment, discussion with family, other specialists and medical decision making of high complexity. I spent 60 minutes of neurocritical care time  in the care of  this patient. This was time spent independent of any time provided by nurse practitioner or PA.   Su Monks, MD [facility] [phone]   If 7pm- 7am, please page neurology on call as listed in [place].

## 2020-10-16 ENCOUNTER — Observation Stay (HOSPITAL_BASED_OUTPATIENT_CLINIC_OR_DEPARTMENT_OTHER): Payer: Medicare Other

## 2020-10-16 ENCOUNTER — Other Ambulatory Visit (HOSPITAL_COMMUNITY): Payer: Self-pay

## 2020-10-16 DIAGNOSIS — I10 Essential (primary) hypertension: Secondary | ICD-10-CM | POA: Diagnosis not present

## 2020-10-16 DIAGNOSIS — E785 Hyperlipidemia, unspecified: Secondary | ICD-10-CM | POA: Diagnosis not present

## 2020-10-16 DIAGNOSIS — E119 Type 2 diabetes mellitus without complications: Secondary | ICD-10-CM

## 2020-10-16 DIAGNOSIS — G459 Transient cerebral ischemic attack, unspecified: Secondary | ICD-10-CM

## 2020-10-16 LAB — ECHOCARDIOGRAM COMPLETE
Area-P 1/2: 3.85 cm2
Height: 66 in
S' Lateral: 2.9 cm
Weight: 3744 oz

## 2020-10-16 LAB — BASIC METABOLIC PANEL
Anion gap: 11 (ref 5–15)
BUN: 16 mg/dL (ref 6–20)
CO2: 23 mmol/L (ref 22–32)
Calcium: 8.8 mg/dL — ABNORMAL LOW (ref 8.9–10.3)
Chloride: 101 mmol/L (ref 98–111)
Creatinine, Ser: 1.15 mg/dL — ABNORMAL HIGH (ref 0.44–1.00)
GFR, Estimated: 55 mL/min — ABNORMAL LOW (ref >60)
Glucose, Bld: 357 mg/dL — ABNORMAL HIGH (ref 70–99)
Potassium: 3.7 mmol/L (ref 3.5–5.1)
Sodium: 135 mmol/L (ref 135–145)

## 2020-10-16 LAB — GLUCOSE, CAPILLARY
Glucose-Capillary: 268 mg/dL — ABNORMAL HIGH (ref 70–99)
Glucose-Capillary: 226 mg/dL — ABNORMAL HIGH (ref 70–99)

## 2020-10-16 LAB — CBC
HCT: 38.6 % (ref 36.0–46.0)
Hemoglobin: 12.6 g/dL (ref 12.0–15.0)
MCH: 29.4 pg (ref 26.0–34.0)
MCHC: 32.6 g/dL (ref 30.0–36.0)
MCV: 90.2 fL (ref 80.0–100.0)
Platelets: 256 10*3/uL (ref 150–400)
RBC: 4.28 MIL/uL (ref 3.87–5.11)
RDW: 13.6 % (ref 11.5–15.5)
WBC: 5.8 10*3/uL (ref 4.0–10.5)
nRBC: 0 % (ref 0.0–0.2)

## 2020-10-16 MED ORDER — ATORVASTATIN CALCIUM 40 MG PO TABS
40.0000 mg | ORAL_TABLET | Freq: Every day | ORAL | 0 refills | Status: DC
  Filled 2020-10-16: qty 30, 30d supply, fill #0

## 2020-10-16 MED ORDER — DICLOFENAC SODIUM 1 % EX GEL
Freq: Two times a day (BID) | CUTANEOUS | 0 refills | Status: AC
  Filled 2020-10-16: qty 100, 10d supply, fill #0

## 2020-10-16 MED ORDER — LEVOTHYROXINE SODIUM 100 MCG PO TABS
300.0000 ug | ORAL_TABLET | Freq: Every day | ORAL | Status: DC
  Administered 2020-10-16: 300 ug via ORAL
  Filled 2020-10-16: qty 3

## 2020-10-16 MED ORDER — INSULIN ASPART 100 UNIT/ML IJ SOLN
0.0000 [IU] | Freq: Three times a day (TID) | INTRAMUSCULAR | Status: DC
  Administered 2020-10-16: 5 [IU] via SUBCUTANEOUS
  Administered 2020-10-16: 8 [IU] via SUBCUTANEOUS

## 2020-10-16 MED ORDER — ASPIRIN 81 MG PO TBEC
81.0000 mg | DELAYED_RELEASE_TABLET | Freq: Every day | ORAL | 11 refills | Status: DC
  Filled 2020-10-16 – 2020-11-12 (×3): qty 30, 30d supply, fill #0

## 2020-10-16 MED ORDER — METFORMIN HCL 500 MG PO TABS
500.0000 mg | ORAL_TABLET | Freq: Two times a day (BID) | ORAL | 11 refills | Status: DC
  Filled 2020-10-16 – 2020-11-12 (×3): qty 60, 30d supply, fill #0
  Filled 2020-12-12: qty 60, 30d supply, fill #1
  Filled 2021-01-10: qty 60, 30d supply, fill #2
  Filled 2021-02-13: qty 60, 30d supply, fill #3
  Filled 2021-03-12: qty 60, 30d supply, fill #4
  Filled 2021-04-14: qty 60, 30d supply, fill #5
  Filled 2021-05-19: qty 60, 30d supply, fill #6
  Filled 2021-06-15: qty 60, 30d supply, fill #7
  Filled 2021-07-17: qty 60, 30d supply, fill #8
  Filled 2021-08-17: qty 60, 30d supply, fill #9
  Filled 2021-09-23: qty 60, 30d supply, fill #10

## 2020-10-16 MED ORDER — TICAGRELOR 90 MG PO TABS: 90.0000 mg | ORAL_TABLET | Freq: Two times a day (BID) | ORAL | Status: DC

## 2020-10-16 MED ORDER — TICAGRELOR 90 MG PO TABS
90.0000 mg | ORAL_TABLET | Freq: Two times a day (BID) | ORAL | 0 refills | Status: AC
  Filled 2020-10-16: qty 42, 21d supply, fill #0

## 2020-10-16 NOTE — Evaluation (Signed)
Occupational Therapy Evaluation Patient Details Name: [name] MRN: [id number] DOB: [birth year] Today's Date: 10/16/2020    History of Present Illness 59 y.o. female presents to [facility] ED on 10/15/2020 with reports of L weakness. MRI and CT negative. PMHx previous history of CVA, renal cell CA, HTN, DM 2.   Clinical Impression   Patient lives with spouse, whom she states is very helpful. Is independent at baseline, cares for grandchildren while daughter is at work. Patient able to perform sponge bath of upper and lower body standing at sink, donned underwear, applied deodorant, combed hair and ambulated to sink without any physical assistance. No acute OT needs at this time, patient expresses wanting to work on her balance more, note PT recommending outpatient therapy which patient is interested in. OT to sign off, please re-consult if new needs arise.      Follow Up Recommendations  No OT follow up    Equipment Recommendations  None recommended by OT       Precautions / Restrictions Precautions Precautions: Fall Restrictions Weight Bearing Restrictions: No      Mobility Bed Mobility Overal bed mobility: Independent                  Transfers Overall transfer level: Independent Equipment used: None                  Balance Overall balance assessment: Mild deficits observed, not formally tested                                         ADL either performed or assessed with clinical judgement   ADL Overall ADL's : Independent                                       General ADL Comments: patient able to perform sponge bath at sink for upper and lower body, don underwear, apply deodorant and ambulate to sink without any physical assistance.      Pertinent Vitals/Pain Pain Assessment: Faces Faces Pain Scale: No hurt     Hand Dominance Right   Extremity/Trunk Assessment Upper Extremity Assessment Upper Extremity  Assessment: Overall WFL for tasks assessed (pt reporting resolution of UE symptoms)   Lower Extremity Assessment Lower Extremity Assessment: Defer to PT evaluation   Cervical / Trunk Assessment Cervical / Trunk Assessment: Normal   Communication Communication Communication: No difficulties   Cognition Arousal/Alertness: Awake/alert Behavior During Therapy: WFL for tasks assessed/performed Overall Cognitive Status: Within Functional Limits for tasks assessed                                 General Comments: patient is very sweet, tangential              Home Living Family/patient expects to be discharged to:: Private residence Living Arrangements: Spouse/significant other Available Help at Discharge: Family;Available PRN/intermittently Type of Home: House Home Access: Stairs to enter [employer] of Steps: 2 Entrance Stairs-Rails: None Home Layout: One level     Bathroom Shower/Tub: Teacher, early years/pre: Standard     Home Equipment: Cane - single point          Prior Functioning/Environment Level of Independence: Independent  Comments: watches her daughter's three children, reports history of falls        OT Problem List: Impaired balance (sitting and/or standing);Obesity;Decreased activity tolerance         OT Goals(Current goals can be found in the care plan section) Acute Rehab OT Goals Patient Stated Goal: to return to independence and reduce falls risk OT Goal Formulation: All assessment and education complete, DC therapy   AM-PAC OT "6 Clicks" Daily Activity     Outcome Measure Help from another person eating meals?: None Help from another person taking care of personal grooming?: None Help from another person toileting, which includes using toliet, bedpan, or urinal?: None Help from another person bathing (including washing, rinsing, drying)?: None Help from another person to put on and taking off  regular upper body clothing?: None Help from another person to put on and taking off regular lower body clothing?: None 6 Click Score: 24   End of Session Nurse Communication: Mobility status  Activity Tolerance: Patient tolerated treatment well Patient left: in chair;with family/visitor present  OT Visit Diagnosis: Other abnormalities of gait and mobility (R26.89)                Time: 7341-9379 OT Time Calculation (min): 22 min Charges:  OT General Charges $OT Visit: 1 Visit OT Evaluation $OT Eval Low Complexity: 1 Low  Delbert Phenix OT OT pager: [place] 10/16/2020, 9:08 AM

## 2020-10-16 NOTE — Discharge Instructions (Addendum)
Dear Mrs. Gust Brooms were admitted to [facility] for treatment of a transient ischemic attack. You were given dual antiplatelet therapy of aspirin and plavix and evaluated with a brain MRI. MRI and CT did not show any acute changes.  Please continue taking aspirin 81 and Brilinta 90 mg twice daily from 10/17/20 to 11/07/20  followed by just aspirin monotherapy.  - Continue Atorvastatin 40 mg for stroke prevention - continue taking your Ozempic. Your janumet was discontinued. Please take metformin instead.  - your Meloxicam was discontinued.  You will use diclofenac cream for your knee pain instead.      Remember to check blood sugars atleast 2 times per day.  -Call Dr Ayesha Rumpf with values that are consistently higher than 160 mg/dL Take medications as prescribed.   For Glucose meter:   [employer] products at [place] meter $9.00 [employer] Prime test strips #100 for $17.88 OR #50 for $9.00   Hope this helps! Diabetes team

## 2020-10-16 NOTE — Progress Notes (Addendum)
STROKE TEAM PROGRESS NOTE    Interval History  No acute events overnight, patient is up in the chair talking with ST.   She states that her left sided weakness has resolved.   Educated about stroke,stroke risk factors and remaining work up   Pertinent Lab Work and Imaging    10/15/20 CT Head WO IV Contrast 1. Normal head CT for age. 2. ASPECTS is 10.  10/15/20 CT Angio Head and Neck W WO IV Contrast No acute large vessel occlusion.   Minimal atherosclerotic change at the left carotid bifurcation but no stenosis.   50% stenosis of the right vertebral artery origin. Posterior circulation appears otherwise normal.  10/15/20 MRI Brain WO IV Contrast No acute or significant finding. Mild chronic small-vessel ischemic change of the hemispheric white matter, unchanged since last year.  10/16/20 Echocardiogram Complete  Pending   Physical Examination   Constitutional: Calm, appropriate for condition  Cardiovascular: Normal RR Respiratory: No increased WOB   Mental status: AAOx4, following commands  Speech: Fluent with repetition and naming intact  Cranial nerves: EOMI, VFF, Face symmetric, Tongue midline, Shoulder shrug intact  Motor: Normal bulk and tone. No drift. Strength 5/5 throughout  Sensory: Intact to light touch throughout  Coordination: FNF HTS intact  Gait: Stable steady gait   NIHSS: 0   Assessment and Plan   [name] is a 60 y.o. female w/pmh of type 2 diabetes mellitus, hypertension, hyperlipidemia, obesity- BMI 38.74, TIAs on clopidogrel, thyroid cancer in 2018, and renal cancer s/p cryoablation in 2018 who presents as a stroke code with left sided weakness. She did not receive IVTPA as she was outside of the time window, not eligible for thrombectomy due to lack of cortical signs indicating LVO.   #Left sided weakness likely Rt brain TIA due to small vessel disease Patient presented with the symptoms described above. At this time, stroke work up is  essentially complete pending echocardiogram findings. MRI Brain did not reveal an acute stroke. CTA Head and Neck was pertinent for 50 % stenosis at the right vertebral artery origin, minimal atherosclerotic change at the left carotid bifurcation. Stroke labs w/LDL 81, hemoglobin a1c 9.0. Echo is currently pending.  - Given she was taking Plavix at home, will initiate DAPT with Brilinta and Aspirin followed by ASA monotherapy. Plavix discontinued on 7/21 and Brilinta 90 mg BID ordered to be initiated tomorrow. DAPT length is 10/17/20 to 11/07/20.  - Continue Atorvastatin 40 mg for stroke prevention  - Referral placed for neurology follow up   #Hypertension She has a history of HTN and takes Lisinopril 10 mg QD and Lopressor 100 mg BID at home. Currently blood pressure is trending in the 150-170 range. No need for permissive hypertension at this time given she does not have an acute stroke.   #Hyperlipidemia From a stroke prevention stand point, the LDL goal is < 70.   #DMII  Hemoglobin A1C this admission noted to be 9.0, not at goal from a stroke standpoint. Recommend inpatient management with SSI and outpatient diabetic management with PCP at discharge.   Hospital day # 0  Ruta Hinds, NP  [facility] Nurse Practitioner Patient seen and discussed with attending physician Dr. Carilyn Goodpasture MD NOTE : I have personally obtained history,examined this patient, reviewed notes, independently viewed imaging studies, participated in medical decision making and plan of care.ROS completed by me personally and pertinent positives fully documented  I have made any additions or clarifications directly to  the above note. Agree with note above. She presented with transient left sided weakness due to right hemispheric TIA with negative brain imaging The current medical regimen is effective;  continue present plan and medications. Ongoing stroke work up and aspirin and brilinta x 4 weeks followed by  aspirin alone.I have spent a total of  35  minutes with the patient reviewing hospital notes,  test results, labs and examining the patient as well as establishing an assessment and plan that was discussed personally with the patient.  > 50% of time was spent in direct patient care.       Antony Contras, MD Medical Director [facility] Pager: [phone] 10/16/2020 5:30 PM   To contact Stroke Continuity provider, please refer to [email or web address] After hours, contact General Neurology

## 2020-10-16 NOTE — Progress Notes (Signed)
  Echocardiogram 2D Echocardiogram has been performed.  [name] 10/16/2020, 3:08 PM

## 2020-10-16 NOTE — TOC Initial Note (Signed)
Transition of Care [facility]) - Initial/Assessment Note    Patient Details  Name: [name] MRN: [id number] Date of Birth: 06-22-1960  Transition of Care [facility]) CM/SW Contact:    Marilu Favre, RN Phone Number: 10/16/2020, 12:48 PM  Clinical Narrative:                 PAtient from home with family.   Confirmed face sheet information.   Discussed PT recommendations for OP PT at [facility] location. Patient in agreement . Order placed . Neurorehab information placed on AVS.   Expected Discharge Plan: Home/Self Care     Patient Goals and CMS Choice Patient states their goals for this hospitalization and ongoing recovery are:: to return to home CMS Medicare.gov Compare Post Acute Care list provided to:: Patient Choice offered to / list presented to : Patient  Expected Discharge Plan and Services Expected Discharge Plan: Home/Self Care   Discharge Planning Services: CM Consult   Living arrangements for the past 2 months: Single Family Home                 DME Arranged: Walker rolling with seat DME Agency: [employer] Date DME Agency Contacted: 10/16/20 Time DME Agency Contacted: [id number] Representative spoke with at DME Agency: [place]: NA          Prior Living Arrangements/Services Living arrangements for the past 2 months: [place] Lives with:: Spouse Patient language and need for interpreter reviewed:: Yes Do you feel safe going back to the place where you live?: Yes      Need for Family Participation in Patient Care: Yes (Comment) Care giver support system in place?: Yes (comment)   Criminal Activity/Legal Involvement Pertinent to Current Situation/Hospitalization: No - Comment as needed  Activities of Daily Living Home Assistive Devices/Equipment: None ADL Screening (condition at time of admission) Patient's cognitive ability adequate to safely complete daily activities?: Yes Is the patient deaf or have difficulty hearing?: No Does  the patient have difficulty seeing, even when wearing glasses/contacts?: No Does the patient have difficulty concentrating, remembering, or making decisions?: No Patient able to express need for assistance with ADLs?: Yes Does the patient have difficulty dressing or bathing?: No Independently performs ADLs?: Yes (appropriate for developmental age) Does the patient have difficulty walking or climbing stairs?: Yes Weakness of Legs: Left Weakness of Arms/Hands: Left  Permission Sought/Granted   Permission granted to share information with : No              Emotional Assessment Appearance:: Appears stated age Attitude/Demeanor/Rapport: Engaged Affect (typically observed): Accepting Orientation: : Oriented to Self, Oriented to Place, Oriented to  Time, Oriented to Situation Alcohol / Substance Use: Not Applicable Psych Involvement: No (comment)  Admission diagnosis:  TIA (transient ischemic attack) [G45.9] Left-sided weakness [R53.1] Patient Active Problem List   Diagnosis Date Noted   Left-sided weakness    TIA (transient ischemic attack) 03/09/2020   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[place]) 11/15/2018   Papillary adenocarcinoma, renal, 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   UTI (urinary tract infection) 06/26/2017   Urinary tract infection 01/24/2017   Abdominal pain 01/21/2017   Stroke-like symptoms 05/16/2016   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 05/16/2016   Fatty liver disease, nonalcoholic 70/62/3762   Slurred speech 09/13/2015   Parotitis 09/13/2015   Gastroenteritis 08/21/2015   Hypothyroid 08/21/2015   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

## 2020-10-16 NOTE — Progress Notes (Signed)
Inpatient Diabetes Program Recommendations  AACE/ADA: New Consensus Statement on Inpatient Glycemic Control (2015)  Target Ranges:  Prepandial:   less than 140 mg/dL      Peak postprandial:   less than 180 mg/dL (1-2 hours)      Critically ill patients:  140 - 180 mg/dL   Lab Results  Component Value Date   GLUCAP 187 (H) 03/10/2020   HGBA1C 9.0 (H) 10/15/2020    Review of Glycemic Control Results for [name], [name] (MRN [id number]) as of 10/16/2020 12:42  Ref. Range 03/10/2020 11:03 03/10/2020 16:08  Glucose-Capillary Latest Ref Range: 70 - 99 mg/dL 227 (H) 187 (H)   Diabetes history: Type 2 DM Outpatient Diabetes medications: Janumet 50-1000 mg BID, Ozempic 0.25 mg Qwk Current orders for Inpatient glycemic control: Novolog 0-15 units TID  Inpatient Diabetes Program Recommendations:    Anticipate need for basal insulin if patient to remain inpatient. AM serum 357 mg/dL, however, patient just ate breakfast making recommendations difficult.   Spoke with patient regarding outpatient diabetes management. Patient was just prescribed Ozempic by PCP this week. Was planning to initiate this on a day that worked with husband's work schedule.  Reviewed patient's current A1c of 9.0%. Explained what a A1c is and what it measures. Also reviewed goal A1c with patient, importance of good glucose control @ home, and blood sugar goals. Reviewed patho of DM, potential need for insulin, role of pancreas, impact of glucose trends with TIAs, vascular changes and commorbidities.  Patient has a meter and just picked up test strips. Discussed that cost with insurance is challenging, thus does not always check as she should. Also, shared that she cannot self inject and get her husband to help. Relion information provided on discharge summary and encouraged more frequent checks. Patient know when to call MD.  No further questions at this time.   Thanks, Bronson Curb, MSN, RNC-OB Diabetes  Coordinator [phone] (8a-5p)

## 2020-10-16 NOTE — Progress Notes (Signed)
Physical Therapy Treatment Patient Details Name: [name] MRN: [id number] DOB: [birth year] Today's Date: 10/16/2020    History of Present Illness 60 y.o. female presents to [facility] ED on 10/15/2020 with reports of L weakness. MRI and CT negative. PMHx previous history of CVA, renal cell CA, HTN, DM 2.    PT Comments    The pt was seen for further progression of ambulation and dynamic stability training. She continues to demo good stability with sit-stand transfers as well as short distance ambulation, but demos progressive fatigue with continued ambulation and slow but steady progression of lateral sway with mobility as the pt fatigues. The pt dose demo slowed movements and decreased dynamic stability as well as endurance deficits that will benefit from both OPPT as well as rollator (4-wheel walker) to safely progress to full activity at home.     Follow Up Recommendations  Outpatient PT     Equipment Recommendations  Other (comment) (rollator)    Recommendations for Other Services       Precautions / Restrictions Precautions Precautions: Fall Restrictions Weight Bearing Restrictions: No    Mobility  Bed Mobility Overal bed mobility: Independent                  Transfers Overall transfer level: Independent Equipment used: None             General transfer comment: no AD or evidence of instability  Ambulation/Gait Ambulation/Gait assistance: Supervision Gait Distance (Feet): 250 Feet Assistive device: None Gait Pattern/deviations: Step-through pattern Gait velocity: 0.66 m/s Gait velocity interpretation: 1.31 - 2.62 ft/sec, indicative of limited community ambulator General Gait Details: pt with step-through pattern, progressed to increased lateral movement and sway with fatigue, but able to tolerate dynmaic gait challenges.   Stairs Stairs: Yes Stairs assistance: Min guard Stair Management: One rail Right;Step to pattern;Forwards Number of Stairs:  2 General stair comments: completed x2 with heavy use of R rail, pt reports she has a post she uses for RUE support at home   Wheelchair Mobility    Modified Rankin (Stroke Patients Only) Modified Rankin (Stroke Patients Only) Pre-Morbid Rankin Score: No symptoms Modified Rankin: Slight disability     Balance Overall balance assessment: Mild deficits observed, not formally tested                               Standardized Balance Assessment Standardized Balance Assessment : Dynamic Gait Index   Dynamic Gait Index Level Surface: Mild Impairment Change in Gait Speed: Normal Gait with Horizontal Head Turns: Normal Gait with Vertical Head Turns: Normal Gait and Pivot Turn: Normal Step Over Obstacle: Mild Impairment Step Around Obstacles: Mild Impairment Steps: Moderate Impairment Total Score: 19      Cognition Arousal/Alertness: Awake/alert Behavior During Therapy: WFL for tasks assessed/performed Overall Cognitive Status: Within Functional Limits for tasks assessed                                 General Comments: patient is very sweet, tangential      Exercises      General Comments General comments (skin integrity, edema, etc.): VSS on RA, pt with 3/4 DOE with 150 ft ambulation      Pertinent Vitals/Pain Pain Assessment: Faces Faces Pain Scale: No hurt Pain Intervention(s): Monitored during session    Home Living Family/patient expects to be discharged to:: Private residence Living  Arrangements: Spouse/significant other Available Help at Discharge: Family;Available PRN/intermittently Type of Home: House Home Access: Stairs to enter Entrance Stairs-Rails: None Home Layout: One level Home Equipment: Cane - single point      Prior Function Level of Independence: Independent      Comments: watches her daughter's three children, reports history of falls   PT Goals (current goals can now be found in the care plan section) Acute  Rehab PT Goals Patient Stated Goal: to return to independence and reduce falls risk PT Goal Formulation: With patient/family Time For Goal Achievement: 10/29/20 Potential to Achieve Goals: Good Progress towards PT goals: Progressing toward goals    Frequency    Min 4X/week      PT Plan Current plan remains appropriate       AM-PAC PT "6 Clicks" Mobility   Outcome Measure  Help needed turning from your back to your side while in a flat bed without using bedrails?: None Help needed moving from lying on your back to sitting on the side of a flat bed without using bedrails?: None Help needed moving to and from a bed to a chair (including a wheelchair)?: None Help needed standing up from a chair using your arms (e.g., wheelchair or bedside chair)?: None Help needed to walk in hospital room?: A Little Help needed climbing 3-5 steps with a railing? : A Little 6 Click Score: 22    End of Session Equipment Utilized During Treatment: Gait belt Activity Tolerance: Patient tolerated treatment well Patient left: in chair;with call bell/phone within reach (pharmacy present) Nurse Communication: Mobility status PT Visit Diagnosis: Other abnormalities of gait and mobility (R26.89)     Time: [id number] PT Time Calculation (min) (ACUTE ONLY): 14 min  Charges:  $Gait Training: 8-22 mins                     Inocencio Homes, PT, DPT   Acute Rehabilitation Department Pager #: [phone]   Otho Bellows 10/16/2020, 9:56 AM

## 2020-10-16 NOTE — Progress Notes (Signed)
Discharge teaching complete. Meds, diet, activity, follow up appointments reviewed and all questions answered. TIA education provided. Copy of instructions given to patient and meds brought to bedside by [facility] pharmacy. Patient discharged home via wheelchair with daughter.

## 2020-10-16 NOTE — Discharge Summary (Addendum)
Name: [name] MRN: [id number] DOB: Jun 01, 1960 60 y.o. PCP: Lin Landsman, MD  Date of Admission: 10/15/2020 10:14 AM Date of Discharge: 10/16/20 Attending Physician: Lucious Groves, DO  Discharge Diagnosis: 1. TIA HTN DM2 HLD RCC Thyroid neoplasm  Discharge Medications: Allergies as of 10/16/2020       Reactions   Lorabid [loracarbef] Anaphylaxis   Amoxicillin Other (See Comments)   Augmentin [amoxicillin-pot Clavulanate] Hives, Nausea And Vomiting, Other (See Comments)   Chest pain   Codeine Nausea And Vomiting   Gadolinium Derivatives Itching, Swelling, Other (See Comments)   Patient will require 13 hr prep prior to administration of gadolinium     Sulfa Drugs Cross Reactors Other (See Comments)   Severe allergic reaction as a child - was not told symptoms   Benicar [olmesartan Medoxomil] Swelling, Rash   Other Rash   Green peas        Medication List     STOP taking these medications    clopidogrel 75 MG tablet Commonly known as: PLAVIX   Janumet 50-1000 MG tablet Generic drug: sitaGLIPtin-metformin   meloxicam 15 MG tablet Commonly known as: Mobic   pravastatin 20 MG tablet Commonly known as: PRAVACHOL       TAKE these medications    albuterol 108 (90 Base) MCG/ACT inhaler Commonly known as: VENTOLIN HFA Inhale 1-2 puffs into the lungs every 6 (six) hours as needed for wheezing or shortness of breath.   aspirin 81 MG EC tablet Take 1 tablet (81 mg total) by mouth daily. Swallow whole. Start taking on: October 17, 2020   atorvastatin 40 MG tablet Commonly known as: LIPITOR Take 1 tablet (40 mg total) by mouth at bedtime.   CALCIUM-MAGNESIUM-ZINC PO Take 2 tablets by mouth 2 (two) times daily.   diclofenac Sodium 1 % Gel Commonly known as: VOLTAREN apply 2 (two) times daily as directed   diphenhydrAMINE 25 MG tablet Commonly known as: BENADRYL Take 25 mg by mouth at bedtime.   fluticasone 50 MCG/ACT nasal spray Commonly known  as: FLONASE Place 2 sprays into both nostrils daily.   furosemide 40 MG tablet Commonly known as: LASIX Take 2 tablets (80 mg total) by mouth 2 (two) times daily.   ibuprofen 200 MG tablet Commonly known as: ADVIL Take 800 mg by mouth every 8 (eight) hours as needed for headache or moderate pain.   levothyroxine 300 MCG tablet Commonly known as: SYNTHROID Take 150-300 mcg by mouth See admin instructions. Take 300 mcg by mouth for 6 days and take 150 mcg on day 7 (Sunday)   lisinopril 10 MG tablet Commonly known as: ZESTRIL Take 10 mg by mouth daily.   metFORMIN 500 MG tablet Commonly known as: Glucophage Take 1 tablet (500 mg total) by mouth 2 (two) times daily with a meal.   metoprolol tartrate 100 MG tablet Commonly known as: LOPRESSOR Take 100 mg by mouth 2 (two) times daily.   mupirocin ointment 2 % Commonly known as: BACTROBAN Apply 1 application topically in the morning and at bedtime. Mole removal site on back   omeprazole 20 MG tablet Commonly known as: PRILOSEC OTC Take 20 mg by mouth daily.   ondansetron 4 MG disintegrating tablet Commonly known as: Zofran ODT Take 1 tablet (4 mg total) by mouth every 8 (eight) hours as needed for nausea or vomiting.   Ozempic (0.25 or 0.5 MG/DOSE) 2 MG/1.5ML Sopn Generic drug: Semaglutide(0.25 or 0.5MG /DOS) Inject 0.25 mg into the skin once a week. Newly  prescribed   potassium chloride SA 20 MEQ tablet Commonly known as: KLOR-CON Take 40 mEq by mouth 2 (two) times daily.   PROBIOTIC PO Take 1 tablet by mouth daily.   ticagrelor 90 MG Tabs tablet Commonly known as: BRILINTA Take 1 tablet (90 mg total) by mouth 2 (two) times daily for 21 days. Start taking on: October 17, 2020   zinc gluconate 50 MG tablet Take 50 mg by mouth daily as needed (flu season).       ASK your doctor about these medications    acetaminophen 500 MG tablet Commonly known as: TYLENOL Take 2 tablets (1,000 mg total) by mouth every 6 (six)  hours as needed (body aches).               Durable Medical Equipment  (From admission, onward)           Start     Ordered   10/16/20 1643  DME Walker  Once       Question Answer Comment  Walker: With [place] Wheels   Patient needs a walker to treat with the following condition Physical deconditioning      10/16/20 1644   10/16/20 1017  For home use only DME 4 wheeled rolling walker with seat  Once       Comments: Rollator  Question:  Patient needs a walker to treat with the following condition  Answer:  Weakness   10/16/20 1016            Disposition and follow-up:   [name] was discharged from [facility] in Stable condition.  At the hospital follow up visit please address:  1.  TIA: Patient with a history of TIAs with risk factors of hypertension, diabetes, hyperlipidemia, and family history of strokes presented with left-sided weakness and slurred speech.  On physical examination motor strength the left upper and left lower extremity are 4 out of 5.  CT head and MRI showed no new strokes. CT neck shows 2% stenosis of the right vertebral artery, but is otherwise unremarkable.  Patient was last seen for TIA event in December 2021.  ECHO was unremarkable. - Appreciate neurology's recommendations - no OT/SLP recommendations - PT recommended outpatient PT and rolling walker with 5" wheels - DAPT with aspirin 81 mg and Plavix 75 mg for 21 days, followed by stand-alone aspirin 81 mg daily. - Start Lipitor 40 mg daily for LDL goal less than 70.   Hypertension: Patient takes lisinopril 10 mg daily. Systolic pressures on admission were in the 371G later systolic pressure 626.  Type 2 diabetes mellitus: Patient on Janumet 50-1000 twice daily at home.  On admission A1c was found to be 9.0, unsure if this in the setting of non adherence. Has plans to start Ozempic which we reinforced. When she does she may discontinue sitagliptan due to lack of  synergistic effect on A1c and increase in side effects.   Hyperlipidemia: Recent lipid panel on admission shows LDL of 81 with a goals of decreasing LDL to less than 70.  Pravastatin switched to atorvastatin. - atorvastatin 40 mg daily  Renal cell carcinoma: Diagnosed in 2020 cryoablated.  Has follow-up CT December 2022 to reassess for reemergence.   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  Currently takes Synthroid 300 m C g 6 days a week and 150 MCG on day 7. -Continue home dose of Synthroid  2.  Labs / imaging needed  at time of follow-up: lipid, CMP  3.  Pending labs/ test needing follow-up: none  Follow-up Appointments:  Follow-up Information     [facility]. Schedule an appointment as soon as possible for a visit.   Specialty: Rehabilitation Contact information: [address] [place] [id number] [place] [phone]              - follow up with PCP in 1 week  Hospital Course by problem list: TIA: 1. Patient presented to the ED with complaints of left sided weakness in her extremitities and numbness in her face. She was given a dose of aspirin 325 and plavix 300 since she was outside the window. Neurology was consulted and MRI was ordered which showed no acute areas of infarction. Blood pressure medication was held for permissive hypertension. ECHO was performed and returned negative. PT evaluated and recommended outpatient PT and walker. She was monitored overnight and returned to baseline with improvement in her neuro exam. OT and SLP felt no need for further follow up. She was discharged in stable condition.  Diabetes: Discussed in detail with patient the importance of diet and exercise as well as medication adherence. She will be continued on Ozempic, Janumet discontinued, will need to take metformin as an outpatient.  Discharge  Exam:   BP (!) 150/83 (BP Location: Right Arm)   Pulse 83   Temp 98.5 F (36.9 C) (Oral)   Resp 18   Ht 5\' 6"  (1.676 m)   Wt 106.1 kg   SpO2 100%   BMI 37.77 kg/m  Discharge exam: Physical Exam Constitutional:      Appearance: She is obese.  HENT:     Head: Normocephalic and atraumatic.  Eyes:     Extraocular Movements: Extraocular movements intact.     Pupils: Pupils are equal, round, and reactive to light.  Cardiovascular:     Rate and Rhythm: Normal rate and regular rhythm.  Pulmonary:     Effort: Pulmonary effort is normal.     Breath sounds: Normal breath sounds.  Abdominal:     General: Abdomen is flat. Bowel sounds are normal.     Palpations: Abdomen is soft.  Musculoskeletal:        General: Normal range of motion.     Cervical back: Normal range of motion and neck supple.  Skin:    General: Skin is warm and dry.  Neurological:     General: No focal deficit present.     Mental Status: She is alert and oriented to person, place, and time.  Psychiatric:        Mood and Affect: Mood normal.        Behavior: Behavior normal.     Pertinent Labs, Studies, and Procedures:  MRI brain: IMPRESSION: No acute or significant finding. Mild chronic small-vessel ischemic change of the hemispheric white matter, unchanged since last year.  ECHO: FINDINGS   Left Ventricle: Left ventricular ejection fraction, by estimation, is 60  to 65%. The left ventricle has normal function. The left ventricle has no  regional wall motion abnormalities. The left ventricular internal cavity  size was normal in size. There is   mild concentric left ventricular hypertrophy. Left ventricular diastolic  parameters are indeterminate. Right Ventricle: The right ventricular size is normal. No increase in  right ventricular wall thickness. Right ventricular systolic function is  normal. Tricuspid regurgitation signal is inadequate for assessing PA  pressure. Left Atrium: Left atrial size was  normal in  size. Right Atrium: Right atrial size was normal in size. Pericardium: There is no evidence of pericardial effusion. Mitral Valve: The mitral valve is normal in structure. No evidence of  mitral valve regurgitation. Tricuspid Valve: The tricuspid valve is normal in structure. Tricuspid  valve regurgitation is not demonstrated. No evidence of tricuspid  stenosis. Aortic Valve: The aortic valve is tricuspid. There is mild thickening of  the aortic valve. Aortic valve regurgitation is not visualized. Pulmonic Valve: The pulmonic valve was normal in structure. Pulmonic valve  regurgitation is not visualized. No evidence of pulmonic stenosis. Aorta: The aortic root is normal in size and structure. Venous: The inferior vena cava is normal in size with greater than 50%  respiratory variability, suggesting right atrial pressure of 3 mmHg. IAS/Shunts: The atrial septum is grossly normal.   Discharge Instructions: Discharge Instructions     Ambulatory referral to Neurology   Complete by: As directed    An appointment is requested in approximately: 4-8 weeks   Ambulatory referral to Physical Therapy   Complete by: As directed    Iontophoresis - 4 mg/ml of dexamethasone: No   T.E.N.S. Unit Evaluation and Dispense as Indicated: No   Call MD for:  difficulty breathing, headache or visual disturbances   Complete by: As directed    Call MD for:  extreme fatigue   Complete by: As directed    Call MD for:  persistant dizziness or light-headedness   Complete by: As directed    Diet - low sodium heart healthy   Complete by: As directed    Increase activity slowly   Complete by: As directed       You were admitted to [facility] for treatment of a transient ischemic attack. You were given dual antiplatelet therapy of aspirin and plavix and evaluated with a brain MRI. MRI and CT did not show any acute changes.   Please continue taking aspirin 81 and Brilinta 90 mg twice  daily from 10/17/20 to 11/07/20  followed by just aspirin monotherapy. - Continue Atorvastatin 40 mg for stroke prevention - continue taking your Ozempic. Your janumet was discontinued. Please take metformin instead. - your Meloxicam was discontinued.  You will use diclofenac cream for your knee pain instead.  Remember to check blood sugars atleast 2 times per day. -Call Dr Ayesha Rumpf with values that are consistently higher than 160 mg/dL Take medications as prescribed.   For Glucose meter:   RELION products at [employer] meter $9.00 RELION Prime test strips #100 for $17.88 OR #50 for $9.00 Signed: Delene Ruffini, MD 10/16/2020, 4:51 PM   Pager: @[phone] @

## 2020-10-16 NOTE — Evaluation (Signed)
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   UTI (urinary tract infection) 06/26/2017   Urinary tract infection 01/24/2017   Abdominal pain 01/21/2017   Stroke-like symptoms 05/16/2016   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 05/16/2016   Fatty liver disease, nonalcoholic 57/84/6962   Slurred speech 09/13/2015   Parotitis 09/13/2015   Gastroenteritis 08/21/2015   Hypothyroid 08/21/2015   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   Pain in the chest    Essential hypertension    Thoracic aortic aneurysm ([place])    Chest pain 04/23/2014   Anginal pain ([place]) 04/23/2014   Type 2 diabetes mellitus ([place])    Hypertension    Hypercholesteremia    Past Medical History:  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 "   Asthma    due to allergies    Cancer ([place])    THRYOID 2016 [place], KIDNEY CANCER MAY 2018  RIGHT RENAL CRYOABLATION   Complication of anesthesia    Diabetes mellitus    TYPE 2   Dyspnea    increased exertion    Dysrhythmia    " irregular heart rate per patient"   Family history of adverse reaction to anesthesia    difficult for father to wake after surgery, AFTHER LOW BP WITH ANESTHESIA   Heart murmur    HOH (hard of hearing)    Hypercholesteremia    Hypertension    PONV (postoperative nausea and vomiting)    Renal cancer, right ([place]) dx'd 07/2018, 10/2018   ablation x 2    Thyroid disease    TIA (transient ischemic attack)    LAST TIA  APRIL 2020 ON  PLAVIX, reports residual effects ,02/2020   Past Surgical History:  Past Surgical History:  Procedure Laterality Date   ABDOMINAL HYSTERECTOMY     COMPLETE   BIOPSY  03/02/2019   Procedure: BIOPSY;  Surgeon: Carol Ada, MD;  Location: [facility] ENDOSCOPY;  Service: Endoscopy;;   CHOLECYSTECTOMY     COLON SURGERY     COLONOSCOPY WITH PROPOFOL N/A 03/02/2019   Procedure: COLONOSCOPY WITH PROPOFOL;  Surgeon: Carol Ada, MD;  Location: [facility] ENDOSCOPY;  Service: Endoscopy;  Laterality: N/A;   ear surgeries      implantable loop recorder placement  06/02/2020   Medtronic Reveal Linq model LNQ22 implantable loop recorder (SN [id number])    IR RADIOLOGIST EVAL & MGMT  01/12/2017   IR RADIOLOGIST EVAL & MGMT  06/16/2017   IR RADIOLOGIST EVAL & MGMT  08/31/2017   IR RADIOLOGIST EVAL & MGMT  12/13/2017   IR RADIOLOGIST EVAL & MGMT  03/16/2018   IR RADIOLOGIST EVAL & MGMT  11/01/2018   IR RADIOLOGIST EVAL & MGMT  12/13/2018   IR RADIOLOGIST EVAL & MGMT  02/15/2019   IR RADIOLOGIST EVAL & MGMT  08/16/2019   IR RADIOLOGIST EVAL & MGMT  11/22/2019   IR RADIOLOGIST EVAL & MGMT  06/05/2020   KNEE ARTHROSCOPY     LEFT HEART CATHETERIZATION WITH CORONARY ANGIOGRAM N/A 04/24/2014  Procedure: LEFT HEART CATHETERIZATION WITH CORONARY ANGIOGRAM;  Surgeon: Burnell Blanks, MD;  Location: [facility] CATH LAB;  Service: Cardiovascular;  Laterality: N/A;   POLYPECTOMY  03/02/2019   Procedure: POLYPECTOMY;  Surgeon: Carol Ada, MD;  Location: [facility] ENDOSCOPY;  Service: Endoscopy;;   RADIOFREQUENCY ABLATION Right 08/05/2017   Procedure: CT RENAL CRYO AND BIOPSY;  Surgeon: Sandi Mariscal, MD;  Location: [facility] ORS;  Service: Anesthesiology;  Laterality: Right;   RADIOFREQUENCY ABLATION Right 11/15/2018   Procedure: RIGHT RENAL CRYOABLATION;  Surgeon: Sandi Mariscal, MD;  Location: [facility] ORS;  Service: Anesthesiology;  Laterality: Right;   THYROIDECTOMY N/A 09/20/2014   Procedure: TOTAL THYROIDECTOMY;  Surgeon: Armandina Gemma, MD;  Location: [facility]  ORS;  Service: General;  Laterality: N/A;   TONSILLECTOMY     HPI:  [name] is a 60 year old female with a past medical history of hypertension, prior TIAs, hyperlipidemia, diabetes, who was brought to the ED with left-sided weakness and slurred speech and being admitted for further work-up for a TIA.   Assessment / Plan / Recommendation Clinical Impression  Cognitive-linguistic evaluation complete. Patient [facility] for all areas addressed. No SLP f/u indicated.    SLP Assessment  SLP Recommendation/Assessment: Patient does not need any further Speech Lanaguage Pathology Services SLP Visit Diagnosis: Cognitive communication deficit (R41.841)    Follow Up Recommendations  None    Frequency and Duration           SLP Evaluation Cognition  Overall Cognitive Status: Within Functional Limits for tasks assessed Orientation Level: Oriented X4       Comprehension  Auditory Comprehension Overall Auditory Comprehension: Appears within functional limits for tasks assessed Visual Recognition/Discrimination Discrimination: Within Function Limits Reading Comprehension Reading Status: Not tested    Expression Expression Primary Mode of Expression: Verbal Verbal Expression Overall Verbal Expression: Appears within functional limits for tasks assessed Written Expression Dominant Hand: Right   Oral / Motor  Oral Motor/Sensory Function Overall Oral Motor/Sensory Function: Within functional limits Motor Speech Overall Motor Speech: Appears within functional limits for tasks assessed   GO                   Gabriel Rainwater MA, CCC-SLP  Mandisa Persinger Meryl 10/16/2020, 9:29 AM

## 2020-10-20 ENCOUNTER — Ambulatory Visit (INDEPENDENT_AMBULATORY_CARE_PROVIDER_SITE_OTHER): Payer: Medicare Other

## 2020-10-20 DIAGNOSIS — G459 Transient cerebral ischemic attack, unspecified: Secondary | ICD-10-CM

## 2020-10-21 ENCOUNTER — Other Ambulatory Visit (HOSPITAL_BASED_OUTPATIENT_CLINIC_OR_DEPARTMENT_OTHER): Payer: Self-pay

## 2020-10-21 ENCOUNTER — Other Ambulatory Visit (HOSPITAL_COMMUNITY): Payer: Self-pay

## 2020-10-21 ENCOUNTER — Telehealth (HOSPITAL_BASED_OUTPATIENT_CLINIC_OR_DEPARTMENT_OTHER): Payer: Self-pay | Admitting: Pharmacist

## 2020-10-21 NOTE — Telephone Encounter (Signed)
Pharmacy Transitions of Care Follow-up Telephone Call  Date of discharge: 10/16/2020  Discharge Diagnosis: TIA  How have you been since you were released from the hospital? Doing well, feeling good overall. No signs, symptoms or concerns in regards to medications today. Patient did advise that she would like to switch to [facility] today as her primary home pharmacy. I will arrange this.    Medication changes made at discharge:  - START: aspirin 81 mg, atorvastatin 40 mg, Brilinta 90 mg BID, voltaren gel 1%, metformin 500 mg BID  - STOPPED: clopidogrel, Janumet, meloxicam, pravastatin   Medication changes verified by the patient? YES    Medication Accessibility: Home Pharmacy: [facility] on [address] - patient will be switching home pharmacy to [facility]    Was the patient provided with refills on discharged medications? Yes   Have all prescriptions been transferred from [facility] to home pharmacy? Completed now, sent to [facility]   Is the patient able to afford medications? Yes    Medication Review: TICAGRELOR (BRILINTA) Ticagrelor 90 mg BID for 21 days total.  - Educated patient on expected duration of therapy with ticagrelor. Aspirin will be continued indefinitely. - Discussed importance of taking medication around the same time every day, - Reviewed potential DDIs with patient - Advised patient of medications to avoid (NSAIDs, aspirin maintenance doses>100 mg daily) - Educated that Tylenol (acetaminophen) will be the preferred analgesic to prevent risk of bleeding  - Emphasized importance of monitoring for signs and symptoms of bleeding (abnormal bruising, prolonged bleeding, nose bleeds, bleeding from gums, discolored urine, black tarry stools)  - Educated patient to notify doctor if shortness of breath or abnormal heartbeat occur - Advised patient to alert all providers of antiplatelet therapy prior to starting a new medication or having  a procedure   Follow-up Appointments:  Endocrinology appointment on 11/03/2020 with [place] with Dr. Denton Lank: confirmed  Cardiology appointment on 11/24/2020 at [facility] Office: confirmed.   If their condition worsens, is the pt aware to call PCP or go to the Emergency Dept.? YES  Harriet Pho, PharmD [place] [facility]  10/21/2020 2:41 PM

## 2020-10-22 LAB — CUP PACEART REMOTE DEVICE CHECK
Date Time Interrogation Session: 20220721174421
Implantable Pulse Generator Implant Date: 20220307

## 2020-10-31 ENCOUNTER — Ambulatory Visit: Payer: Medicare Other

## 2020-11-06 ENCOUNTER — Other Ambulatory Visit: Payer: Self-pay

## 2020-11-06 ENCOUNTER — Other Ambulatory Visit: Payer: Self-pay | Admitting: Interventional Radiology

## 2020-11-06 DIAGNOSIS — N2889 Other specified disorders of kidney and ureter: Secondary | ICD-10-CM

## 2020-11-09 ENCOUNTER — Ambulatory Visit
Admission: RE | Admit: 2020-11-09 | Discharge: 2020-11-09 | Disposition: A | Discharge status: Home / Self Care | Payer: Medicare Other | Source: Ambulatory Visit | Attending: Internal Medicine | Admitting: Internal Medicine

## 2020-11-09 ENCOUNTER — Other Ambulatory Visit: Payer: Self-pay

## 2020-11-09 VITALS — BP 142/86 | HR 96 | Temp 99.6°F | Resp 18

## 2020-11-09 DIAGNOSIS — R300 Dysuria: Secondary | ICD-10-CM | POA: Diagnosis present

## 2020-11-09 DIAGNOSIS — R350 Frequency of micturition: Secondary | ICD-10-CM | POA: Insufficient documentation

## 2020-11-09 DIAGNOSIS — N3001 Acute cystitis with hematuria: Secondary | ICD-10-CM

## 2020-11-09 DIAGNOSIS — K047 Periapical abscess without sinus: Secondary | ICD-10-CM | POA: Diagnosis present

## 2020-11-09 LAB — POCT URINALYSIS DIP (MANUAL ENTRY)
Bilirubin, UA: NEGATIVE
Glucose, UA: 500 mg/dL — AB
Ketones, POC UA: NEGATIVE mg/dL
Nitrite, UA: NEGATIVE
Protein Ur, POC: 30 mg/dL — AB
Spec Grav, UA: 1.015 (ref 1.010–1.025)
Urobilinogen, UA: 0.2 E.U./dL
pH, UA: 5.5 (ref 5.0–8.0)

## 2020-11-09 MED ORDER — CLINDAMYCIN HCL 300 MG PO CAPS: 300.0000 mg | ORAL_CAPSULE | Freq: Three times a day (TID) | ORAL | 0 refills | Status: DC

## 2020-11-09 MED ORDER — CLINDAMYCIN HCL 300 MG PO CAPS: 300.0000 mg | ORAL_CAPSULE | Freq: Three times a day (TID) | ORAL | 0 refills | Status: AC

## 2020-11-09 NOTE — Discharge Instructions (Addendum)
You are being treated for tooth abscess and urinary tract infection with clindamycin antibiotic.  This is a strong antibiotic so please take it with food.  Also eat yogurt to prevent stomach upset and yeast infections from occurring.  Urine culture is pending.  We will call if it is positive.  Please follow-up with dentist as soon as possible for further evaluation and management of tooth abscess.

## 2020-11-09 NOTE — ED Triage Notes (Signed)
Pt sts urinary urgency and frequency yesterday and sts some difficulty urinating at all today; pt sts pain and swelling to right lower jaw she thinks from bad tooth she noticed this morning

## 2020-11-09 NOTE — ED Provider Notes (Signed)
[facility]    CSN: [id number] Arrival date & time: 11/09/20  1240      History   Chief Complaint Chief Complaint  Patient presents with   Appointment    1300   Dysuria   Dental Pain    HPI [name] is a 60 y.o. female.   Patient presents with 1 day history of right tooth pain and right facial swelling that she noticed this morning.  Also having some urinary urgency and frequency that started yesterday.  Denies any urinary burning, pelvic pain, abdominal pain, fever, back pain.  Denies any vaginal discharge or known exposure to STD.  Patient is in a monogamous relationship with her husband and is not concerned for STD.  Denies any headaches, dizziness, blurred vision, nausea, vomiting.   Dysuria Dental Pain  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 "   Asthma    due to allergies    Cancer ([place])    THRYOID 2016 [place] WITH SURGERY, KIDNEY CANCER MAY 2018  RIGHT RENAL CRYOABLATION   Complication of anesthesia    Diabetes mellitus    TYPE 2   Dyspnea    increased exertion    Dysrhythmia    " irregular heart rate per patient"   Family history of adverse reaction to anesthesia    difficult for father to wake after surgery, AFTHER LOW BP WITH ANESTHESIA   Heart murmur    HOH (hard of hearing)    Hypercholesteremia    Hypertension    PONV (postoperative nausea and vomiting)    Renal cancer, right ([place]) dx'd 07/2018, 10/2018   ablation x 2    Thyroid disease    TIA (transient ischemic attack)    LAST TIA  APRIL 2020 ON PLAVIX, reports residual effects ,02/2020    Patient Active Problem List   Diagnosis Date Noted   Left-sided weakness    TIA (transient ischemic attack) 03/09/2020   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[place]) 11/15/2018   Papillary adenocarcinoma, renal, 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   UTI (urinary tract infection) 06/26/2017   Urinary tract infection 01/24/2017   Abdominal pain 01/21/2017    Stroke-like symptoms 05/16/2016   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 05/16/2016   Fatty liver disease, nonalcoholic Q000111Q   Slurred speech 09/13/2015   Parotitis 09/13/2015   Gastroenteritis 08/21/2015   Hypothyroid 08/21/2015   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   Pain in the chest    Essential hypertension    Thoracic aortic aneurysm ([place])    Chest pain 04/23/2014   Anginal pain ([place]) 04/23/2014   Type 2 diabetes mellitus ([place])    Hypertension    Hypercholesteremia     Past Surgical History:  Procedure Laterality Date   ABDOMINAL HYSTERECTOMY     COMPLETE   BIOPSY  03/02/2019   Procedure: BIOPSY;  Surgeon: Carol Ada, MD;  Location: [facility];  Service: Endoscopy;;   CHOLECYSTECTOMY     COLON SURGERY     COLONOSCOPY WITH PROPOFOL N/A 03/02/2019   Procedure: COLONOSCOPY WITH PROPOFOL;  Surgeon: Carol Ada, MD;  Location: [facility];  Service: Endoscopy;  Laterality: N/A;   ear surgeries      implantable loop recorder placement  06/02/2020   Medtronic Reveal Linq model [id number] implantable loop recorder (SN [id number])    IR RADIOLOGIST EVAL & MGMT  01/12/2017  IR RADIOLOGIST EVAL & MGMT  06/16/2017   IR RADIOLOGIST EVAL & MGMT  08/31/2017   IR RADIOLOGIST EVAL & MGMT  12/13/2017   IR RADIOLOGIST EVAL & MGMT  03/16/2018   IR RADIOLOGIST EVAL & MGMT  11/01/2018   IR RADIOLOGIST EVAL & MGMT  12/13/2018   IR RADIOLOGIST EVAL & MGMT  02/15/2019   IR RADIOLOGIST EVAL & MGMT  08/16/2019   IR RADIOLOGIST EVAL & MGMT  11/22/2019   IR RADIOLOGIST EVAL & MGMT  06/05/2020   KNEE ARTHROSCOPY     LEFT HEART CATHETERIZATION WITH CORONARY ANGIOGRAM N/A 04/24/2014   Procedure: LEFT HEART CATHETERIZATION WITH CORONARY ANGIOGRAM;  Surgeon: Burnell Blanks, MD;  Location: [facility] CATH LAB;  Service: Cardiovascular;  Laterality: N/A;   POLYPECTOMY  03/02/2019   Procedure: POLYPECTOMY;  Surgeon: Carol Ada, MD;   Location: [facility];  Service: Endoscopy;;   RADIOFREQUENCY ABLATION Right 08/05/2017   Procedure: CT RENAL CRYO AND BIOPSY;  Surgeon: Sandi Mariscal, MD;  Location: [facility];  Service: Anesthesiology;  Laterality: Right;   RADIOFREQUENCY ABLATION Right 11/15/2018   Procedure: RIGHT RENAL CRYOABLATION;  Surgeon: Sandi Mariscal, MD;  Location: [facility];  Service: Anesthesiology;  Laterality: Right;   THYROIDECTOMY N/A 09/20/2014   Procedure: TOTAL THYROIDECTOMY;  Surgeon: Armandina Gemma, MD;  Location: [facility];  Service: General;  Laterality: N/A;   TONSILLECTOMY      OB History   No obstetric history on file.      Home Medications    Prior to Admission medications   Medication Sig Start Date End Date Taking? Authorizing Provider  acetaminophen (TYLENOL) 500 MG tablet Take 2 tablets (1,000 mg total) by mouth every 6 (six) hours as needed (body aches). Patient taking differently: Take 1,000 mg by mouth every 8 (eight) hours as needed for mild pain. Alternates w Ibuprofen 06/29/17   Eulogio Bear U, DO  albuterol (PROVENTIL HFA;VENTOLIN HFA) 108 (90 Base) MCG/ACT inhaler Inhale 1-2 puffs into the lungs every 6 (six) hours as needed for wheezing or shortness of breath. 12/28/17   Jean Rosenthal, MD  aspirin 81 MG EC tablet Take 1 tablet (81 mg total) by mouth daily. Swallow whole. 10/17/20   Delene Ruffini, MD  atorvastatin (LIPITOR) 40 MG tablet Take 1 tablet (40 mg total) by mouth at bedtime. 10/16/20 11/15/20  Delene Ruffini, MD  CALCIUM-MAGNESIUM-ZINC PO Take 2 tablets by mouth 2 (two) times daily.    [provider]  clindamycin (CLEOCIN) 300 MG capsule Take 1 capsule (300 mg total) by mouth 3 (three) times daily for 10 days. 11/09/20 11/19/20  Odis Luster, FNP  diclofenac Sodium (VOLTAREN) 1 % GEL Apply to affected area(s) twice daily as directed 10/16/20   Delene Ruffini, MD  diphenhydrAMINE (BENADRYL) 25 MG tablet Take 25 mg by mouth at bedtime.    [provider]  fluticasone  (FLONASE) 50 MCG/ACT nasal spray Place 2 sprays into both nostrils daily. 03/04/20   [provider]  furosemide (LASIX) 40 MG tablet Take 2 tablets (80 mg total) by mouth 2 (two) times daily. 05/17/16   Geradine Girt, DO  ibuprofen (ADVIL) 200 MG tablet Take 800 mg by mouth every 8 (eight) hours as needed for headache or moderate pain.    [provider]  levothyroxine (SYNTHROID, LEVOTHROID) 300 MCG tablet Take 150-300 mcg by mouth See admin instructions. Take 300 mcg by mouth for 6 days and take 150 mcg on day 7 (Sunday)    [provider]  lisinopril (PRINIVIL,ZESTRIL) 10 MG tablet Take 10 mg by mouth daily.  09/09/14   [provider]  metFORMIN (GLUCOPHAGE) 500 MG tablet Take 1 tablet (500 mg total) by mouth 2 (two) times daily with a meal. 10/16/20 10/16/21  Delene Ruffini, MD  metoprolol tartrate (LOPRESSOR) 100 MG tablet Take 100 mg by mouth 2 (two) times daily. 06/23/16   [provider]  mupirocin ointment (BACTROBAN) 2 % Apply 1 application topically in the morning and at bedtime. Mole removal site on back 09/16/20   [provider]  omeprazole (PRILOSEC OTC) 20 MG tablet Take 20 mg by mouth daily.    [provider]  ondansetron (ZOFRAN ODT) 4 MG disintegrating tablet Take 1 tablet (4 mg total) by mouth every 8 (eight) hours as needed for nausea or vomiting. 10/09/20   Isla Pence, MD  OZEMPIC, 0.25 OR 0.5 MG/DOSE, 2 MG/1.5ML SOPN Inject 0.25 mg into the skin once a week. Newly prescribed 10/13/20   [provider]  potassium chloride SA (K-DUR,KLOR-CON) 20 MEQ tablet Take 40 mEq by mouth 2 (two) times daily.    [provider]  Probiotic Product (PROBIOTIC PO) Take 1 tablet by mouth daily.    [provider]  zinc gluconate 50 MG tablet Take 50 mg by mouth daily as needed (flu season).    [provider]    Family History Family History  Problem Relation Age of Onset   Heart failure  Mother    Diabetes Mother    Stroke Mother    Stroke Father    Heart failure Father    Cancer Father     Social History Social History   Tobacco Use   Smoking status: Never   Smokeless tobacco: Never  Vaping Use   Vaping Use: Never used  Substance Use Topics   Alcohol use: No   Drug use: No     Allergies   Lorabid [loracarbef], Amoxicillin, Augmentin [amoxicillin-pot clavulanate], Codeine, Gadolinium derivatives, Sulfa drugs cross reactors, Benicar [olmesartan medoxomil], and Other   Review of Systems Review of Systems Per HPI  Physical Exam Triage Vital Signs ED Triage Vitals  Enc Vitals Group     BP 11/09/20 1342 (!) 142/86     Pulse Rate 11/09/20 1342 96     Resp 11/09/20 1342 18     Temp 11/09/20 1342 99.6 F (37.6 C)     Temp Source 11/09/20 1342 Oral     SpO2 11/09/20 1342 96 %     Weight --      Height --      Head Circumference --      Peak Flow --      Pain Score 11/09/20 1343 4     Pain Loc --      Pain Edu? --      Excl. in [place]? --    No data found.  Updated Vital Signs BP (!) 142/86 (BP Location: Left Arm)   Pulse 96   Temp 99.6 F (37.6 C) (Oral)   Resp 18   SpO2 96%   Visual Acuity Right Eye Distance:   Left Eye Distance:   Bilateral Distance:    Right Eye Near:   Left Eye Near:    Bilateral Near:     Physical Exam Constitutional:      Appearance: Normal appearance.  HENT:     Head: Normocephalic and atraumatic.     Mouth/Throat:     Mouth: Mucous membranes are moist.  Dentition: Gingival swelling and dental abscesses present.     Pharynx: No posterior oropharyngeal erythema.     Comments: Gingival swelling and erythema present to right lower portion of teeth. Eyes:     Extraocular Movements: Extraocular movements intact.     Conjunctiva/sclera: Conjunctivae normal.  Pulmonary:     Effort: Pulmonary effort is normal.  Abdominal:     General: Bowel sounds are normal. There is no distension.     Palpations: Abdomen is  soft.     Tenderness: There is no abdominal tenderness.  Lymphadenopathy:     Cervical: No cervical adenopathy.     Comments: No lymph node involvement noted.  Skin:    General: Skin is warm and dry.  Neurological:     General: No focal deficit present.     Mental Status: She is alert and oriented to person, place, and time. Mental status is at baseline.  Psychiatric:        Mood and Affect: Mood normal.        Behavior: Behavior normal.        Thought Content: Thought content normal.        Judgment: Judgment normal.     UC Treatments / Results  Labs (all labs ordered are listed, but only abnormal results are displayed) Labs Reviewed  POCT URINALYSIS DIP (MANUAL ENTRY) - Abnormal; Notable for the following components:      Result Value   Glucose, UA =500 (*)    Blood, UA trace-intact (*)    Protein Ur, POC =30 (*)    Leukocytes, UA Small (1+) (*)    All other components within normal limits  URINE CULTURE    EKG   Radiology No results found.  Procedures Procedures (including critical care time)  Medications Ordered in UC Medications - No data to display  Initial Impression / Assessment and Plan / UC Course  I have reviewed the triage vital signs and the nursing notes.  Pertinent labs & imaging results that were available during my care of the patient were reviewed by me and considered in my medical decision making (see chart for details).     Will treat dental abscess and urinary tract infection with clindamycin as patient is allergic to penicillins and is unsure if she can take cephalosporins.  Urine culture is pending. Will change treatment if appropriate.  Advised patient go to the dentist soon as possible for further evaluation and management of tooth abscess.  Patient to use warm compresses to affected area of mouth.  Discussed strict return precautions. Patient verbalized understanding and is agreeable with plan.  Final Clinical Impressions(s) / UC Diagnoses    Final diagnoses:  Tooth abscess  Acute cystitis with hematuria  Dysuria  Urinary frequency     Discharge Instructions      You are being treated for tooth abscess and urinary tract infection with clindamycin antibiotic.  This is a strong antibiotic so please take it with food.  Also eat yogurt to prevent stomach upset and yeast infections from occurring.  Urine culture is pending.  We will call if it is positive.  Please follow-up with dentist as soon as possible for further evaluation and management of tooth abscess.     ED Prescriptions     Medication Sig Dispense Auth. Provider   clindamycin (CLEOCIN) 300 MG capsule  (Status: Discontinued) Take 1 capsule (300 mg total) by mouth 3 (three) times daily for 10 days. 30 capsule Odis Luster, FNP  clindamycin (CLEOCIN) 300 MG capsule Take 1 capsule (300 mg total) by mouth 3 (three) times daily for 10 days. 30 capsule Odis Luster, FNP      PDMP not reviewed this encounter.   Odis Luster, FNP 11/09/20 1540

## 2020-11-12 ENCOUNTER — Other Ambulatory Visit (HOSPITAL_COMMUNITY): Payer: Self-pay

## 2020-11-12 ENCOUNTER — Other Ambulatory Visit: Payer: Self-pay | Admitting: Internal Medicine

## 2020-11-12 ENCOUNTER — Other Ambulatory Visit: Payer: Self-pay

## 2020-11-12 LAB — URINE CULTURE: Culture: 30000 — AB

## 2020-11-13 ENCOUNTER — Telehealth (HOSPITAL_COMMUNITY): Payer: Self-pay | Admitting: Emergency Medicine

## 2020-11-13 ENCOUNTER — Other Ambulatory Visit (HOSPITAL_COMMUNITY): Payer: Self-pay

## 2020-11-13 MED ORDER — NITROFURANTOIN MONOHYD MACRO 100 MG PO CAPS: 100.0000 mg | ORAL_CAPSULE | Freq: Two times a day (BID) | ORAL | 0 refills | Status: DC

## 2020-11-13 NOTE — Progress Notes (Signed)
Carelink Summary Report / Loop Recorder 

## 2020-11-14 ENCOUNTER — Other Ambulatory Visit: Payer: Self-pay

## 2020-11-14 ENCOUNTER — Other Ambulatory Visit (HOSPITAL_COMMUNITY): Payer: Self-pay

## 2020-11-14 MED ORDER — ATORVASTATIN CALCIUM 40 MG PO TABS
40.0000 mg | ORAL_TABLET | Freq: Every day | ORAL | 3 refills | Status: DC
  Filled 2020-11-14: qty 30, 30d supply, fill #0
  Filled 2020-12-12: qty 30, 30d supply, fill #1
  Filled 2021-01-10: qty 30, 30d supply, fill #2
  Filled 2021-02-13: qty 30, 30d supply, fill #3

## 2020-11-15 ENCOUNTER — Other Ambulatory Visit (HOSPITAL_COMMUNITY): Payer: Self-pay

## 2020-11-17 ENCOUNTER — Other Ambulatory Visit (HOSPITAL_COMMUNITY): Payer: Self-pay

## 2020-11-17 MED ORDER — ATORVASTATIN CALCIUM 40 MG PO TABS
40.0000 mg | ORAL_TABLET | Freq: Every day | ORAL | 3 refills | Status: DC
  Filled 2020-11-17: qty 30, 30d supply, fill #0

## 2020-11-17 MED ORDER — BRILINTA 90 MG PO TABS
ORAL_TABLET | ORAL | 3 refills | Status: DC
  Filled 2020-11-17: qty 30, 30d supply, fill #0

## 2020-11-17 MED ORDER — OZEMPIC (0.25 OR 0.5 MG/DOSE) 2 MG/1.5ML ~~LOC~~ SOPN
PEN_INJECTOR | SUBCUTANEOUS | 1 refills | Status: DC
  Filled 2020-11-17 – 2020-11-18 (×2): qty 1.5, 28d supply, fill #0
  Filled 2020-12-27: qty 1.5, 28d supply, fill #1

## 2020-11-17 MED ORDER — LEVOTHYROXINE SODIUM 300 MCG PO TABS
ORAL_TABLET | ORAL | 0 refills | Status: DC
  Filled 2020-11-17: qty 21, 22d supply, fill #0

## 2020-11-18 ENCOUNTER — Other Ambulatory Visit (HOSPITAL_COMMUNITY): Payer: Self-pay

## 2020-11-18 ENCOUNTER — Other Ambulatory Visit: Payer: Self-pay

## 2020-11-19 ENCOUNTER — Other Ambulatory Visit (HOSPITAL_COMMUNITY): Payer: Self-pay

## 2020-11-19 MED ORDER — BRILINTA 90 MG PO TABS
ORAL_TABLET | ORAL | 2 refills | Status: DC
  Filled 2020-11-19: qty 42, 21d supply, fill #0

## 2020-11-21 ENCOUNTER — Encounter (HOSPITAL_COMMUNITY): Payer: Self-pay

## 2020-11-21 ENCOUNTER — Ambulatory Visit (HOSPITAL_COMMUNITY)
Admission: RE | Admit: 2020-11-21 | Discharge: 2020-11-21 | Disposition: A | Discharge status: Home / Self Care | Payer: Medicare Other | Source: Ambulatory Visit | Attending: Interventional Radiology | Admitting: Interventional Radiology

## 2020-11-21 ENCOUNTER — Other Ambulatory Visit: Payer: Self-pay

## 2020-11-21 ENCOUNTER — Other Ambulatory Visit (HOSPITAL_COMMUNITY): Payer: Self-pay

## 2020-11-21 DIAGNOSIS — N2889 Other specified disorders of kidney and ureter: Secondary | ICD-10-CM | POA: Diagnosis present

## 2020-11-21 LAB — POCT I-STAT CREATININE: Creatinine, Ser: 0.9 mg/dL (ref 0.44–1.00)

## 2020-11-21 MED ORDER — BRILINTA 90 MG PO TABS
ORAL_TABLET | ORAL | 2 refills | Status: DC
  Filled 2020-11-21: qty 180, 90d supply, fill #0

## 2020-11-21 MED ORDER — IOHEXOL 350 MG/ML SOLN
80.0000 mL | Freq: Once | INTRAVENOUS | Status: AC | PRN
  Administered 2020-11-21: 80 mL via INTRAVENOUS

## 2020-11-24 ENCOUNTER — Other Ambulatory Visit (HOSPITAL_COMMUNITY): Payer: Self-pay

## 2020-11-24 ENCOUNTER — Ambulatory Visit (INDEPENDENT_AMBULATORY_CARE_PROVIDER_SITE_OTHER): Payer: Medicare Other

## 2020-11-24 DIAGNOSIS — G459 Transient cerebral ischemic attack, unspecified: Secondary | ICD-10-CM | POA: Diagnosis not present

## 2020-11-24 LAB — CUP PACEART REMOTE DEVICE CHECK
Date Time Interrogation Session: 20220823174454
Implantable Pulse Generator Implant Date: 20220307

## 2020-11-25 ENCOUNTER — Other Ambulatory Visit (HOSPITAL_COMMUNITY): Payer: Self-pay

## 2020-11-25 MED ORDER — ATORVASTATIN CALCIUM 40 MG PO TABS
40.0000 mg | ORAL_TABLET | Freq: Every day | ORAL | 0 refills | Status: DC
  Filled 2020-11-25: qty 30, 30d supply, fill #0

## 2020-11-27 ENCOUNTER — Ambulatory Visit
Admission: RE | Admit: 2020-11-27 | Discharge: 2020-11-27 | Disposition: A | Discharge status: Home / Self Care | Payer: Medicare Other | Source: Ambulatory Visit | Attending: Interventional Radiology | Admitting: Interventional Radiology

## 2020-11-27 ENCOUNTER — Other Ambulatory Visit: Payer: Self-pay

## 2020-11-27 ENCOUNTER — Encounter: Payer: Self-pay | Admitting: *Deleted

## 2020-11-27 DIAGNOSIS — N2889 Other specified disorders of kidney and ureter: Secondary | ICD-10-CM

## 2020-11-27 HISTORY — PX: IR RADIOLOGIST EVAL & MGMT: IMG5224

## 2020-11-27 NOTE — Progress Notes (Signed)
Patient ID: [name], female   DOB: 07-May-1960, 60 y.o.   MRN: [id number]        Chief Complaint: Post repeat right-sided cryoablation (11/15/2018).   Referring Physician(s): Bell   History of Present Illness:   [name] is a 60 y.o. female is a 60 year old female with past medical history significant for diabetes, hypertension, hyperlipidemia and thyroid cancer who initially underwent image guided right-sided renal cryoablation on 08/05/2017 of 2 right-sided renal lesions (superior pole and posterior interpolar lesions) who was found to have residual disease involving the superior pole lesion for which she underwent a repeat renal cryoablation on 11/15/2018.     She is seen in follow-up consultation via telemedicine following the acquisition of postprocedural CT scan performed 11/24/2020.   She is currently without complaint.    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 "   Asthma    due to allergies    Cancer ([place])    THRYOID 2016 [place], KIDNEY CANCER MAY 2018  RIGHT RENAL CRYOABLATION   Complication of anesthesia    Diabetes mellitus    TYPE 2   Dyspnea    increased exertion    Dysrhythmia    " irregular heart rate per patient"   Family history of adverse reaction to anesthesia    difficult for father to wake after surgery, AFTHER LOW BP WITH ANESTHESIA   Heart murmur    HOH (hard of hearing)    Hypercholesteremia    Hypertension    PONV (postoperative nausea and vomiting)    Renal cancer, right ([place]) dx'd 07/2018, 10/2018   ablation x 2    Thyroid disease    TIA (transient ischemic attack)    LAST TIA  APRIL 2020 ON PLAVIX, reports residual effects ,02/2020    Past Surgical History:  Procedure Laterality Date   ABDOMINAL HYSTERECTOMY     COMPLETE   BIOPSY  03/02/2019   Procedure: BIOPSY;  Surgeon: Carol Ada, MD;  Location: [facility];  Service: Endoscopy;;   CHOLECYSTECTOMY     COLON SURGERY      COLONOSCOPY WITH PROPOFOL N/A 03/02/2019   Procedure: COLONOSCOPY WITH PROPOFOL;  Surgeon: Carol Ada, MD;  Location: [facility];  Service: Endoscopy;  Laterality: N/A;   ear surgeries      implantable loop recorder placement  06/02/2020   Medtronic Reveal Linq model LNQ22 implantable loop recorder (SN [id number])    IR RADIOLOGIST EVAL & MGMT  01/12/2017   IR RADIOLOGIST EVAL & MGMT  06/16/2017   IR RADIOLOGIST EVAL & MGMT  08/31/2017   IR RADIOLOGIST EVAL & MGMT  12/13/2017   IR RADIOLOGIST EVAL & MGMT  03/16/2018   IR RADIOLOGIST EVAL & MGMT  11/01/2018   IR RADIOLOGIST EVAL & MGMT  12/13/2018   IR RADIOLOGIST EVAL & MGMT  02/15/2019   IR RADIOLOGIST EVAL & MGMT  08/16/2019   IR RADIOLOGIST EVAL & MGMT  11/22/2019   IR RADIOLOGIST EVAL & MGMT  06/05/2020   KNEE ARTHROSCOPY     LEFT HEART CATHETERIZATION WITH CORONARY ANGIOGRAM N/A 04/24/2014   Procedure: LEFT HEART CATHETERIZATION WITH CORONARY ANGIOGRAM;  Surgeon: Burnell Blanks, MD;  Location: [facility] CATH LAB;  Service: Cardiovascular;  Laterality: N/A;   POLYPECTOMY  03/02/2019   Procedure: POLYPECTOMY;  Surgeon: Carol Ada, MD;  Location: [facility];  Service: Endoscopy;;   RADIOFREQUENCY ABLATION Right 08/05/2017  Procedure: CT RENAL CRYO AND BIOPSY;  Surgeon: Sandi Mariscal, MD;  Location: [facility];  Service: Anesthesiology;  Laterality: Right;   RADIOFREQUENCY ABLATION Right 11/15/2018   Procedure: RIGHT RENAL CRYOABLATION;  Surgeon: Sandi Mariscal, MD;  Location: [facility];  Service: Anesthesiology;  Laterality: Right;   THYROIDECTOMY N/A 09/20/2014   Procedure: TOTAL THYROIDECTOMY;  Surgeon: Armandina Gemma, MD;  Location: [facility];  Service: General;  Laterality: N/A;   TONSILLECTOMY      Allergies: Lorabid [loracarbef], Amoxicillin, Augmentin [amoxicillin-pot clavulanate], Codeine, Gadolinium derivatives, Sulfa drugs cross reactors, Benicar [olmesartan medoxomil], and Other  Medications: Prior to Admission medications   Medication  Sig Start Date End Date Taking? Authorizing Provider  acetaminophen (TYLENOL) 500 MG tablet Take 2 tablets (1,000 mg total) by mouth every 6 (six) hours as needed (body aches). Patient taking differently: Take 1,000 mg by mouth every 8 (eight) hours as needed for mild pain. Alternates w Ibuprofen 06/29/17   Eulogio Bear U, DO  albuterol (PROVENTIL HFA;VENTOLIN HFA) 108 (90 Base) MCG/ACT inhaler Inhale 1-2 puffs into the lungs every 6 (six) hours as needed for wheezing or shortness of breath. 12/28/17   Jean Rosenthal, MD  aspirin 81 MG EC tablet Take 1 tablet (81 mg total) by mouth daily. Swallow whole. 10/17/20   Delene Ruffini, MD  atorvastatin (LIPITOR) 40 MG tablet Take 1 tablet (40 mg total) by mouth at bedtime. 11/14/20 12/17/20    atorvastatin (LIPITOR) 40 MG tablet Take 1 tablet by mouth at bedtime. 11/17/20     atorvastatin (LIPITOR) 40 MG tablet Take 1 tablet (40 mg total) by mouth at bedtime. 11/25/20     CALCIUM-MAGNESIUM-ZINC PO Take 2 tablets by mouth 2 (two) times daily.    [provider]  diclofenac Sodium (VOLTAREN) 1 % GEL Apply to affected area(s) twice daily as directed 10/16/20   Delene Ruffini, MD  diphenhydrAMINE (BENADRYL) 25 MG tablet Take 25 mg by mouth at bedtime.    [provider]  fluticasone (FLONASE) 50 MCG/ACT nasal spray Place 2 sprays into both nostrils daily. 03/04/20   [provider]  furosemide (LASIX) 40 MG tablet Take 2 tablets (80 mg total) by mouth 2 (two) times daily. 05/17/16   Geradine Girt, DO  ibuprofen (ADVIL) 200 MG tablet Take 800 mg by mouth every 8 (eight) hours as needed for headache or moderate pain.    [provider]  levothyroxine (SYNTHROID) 300 MCG tablet Take 1 tablet by mouth daily for 6 days a week then 1/2 tablet on Saturday 08/26/20     levothyroxine (SYNTHROID, LEVOTHROID) 300 MCG tablet Take 150-300 mcg by mouth See admin instructions. Take 300 mcg by mouth for 6 days and take 150 mcg on day 7 (Sunday)     [provider]  lisinopril (PRINIVIL,ZESTRIL) 10 MG tablet Take 10 mg by mouth daily.  09/09/14   [provider]  metFORMIN (GLUCOPHAGE) 500 MG tablet Take 1 tablet (500 mg total) by mouth 2 (two) times daily with a meal. 10/16/20 10/16/21  Delene Ruffini, MD  metoprolol tartrate (LOPRESSOR) 100 MG tablet Take 100 mg by mouth 2 (two) times daily. 06/23/16   [provider]  mupirocin ointment (BACTROBAN) 2 % Apply 1 application topically in the morning and at bedtime. Mole removal site on back 09/16/20   [provider]  nitrofurantoin, macrocrystal-monohydrate, (MACROBID) 100 MG capsule Take 1 capsule (100 mg total) by mouth 2 (two) times daily. 11/13/20   Odis Luster, FNP  omeprazole (  PRILOSEC OTC) 20 MG tablet Take 20 mg by mouth daily.    [provider]  ondansetron (ZOFRAN ODT) 4 MG disintegrating tablet Take 1 tablet (4 mg total) by mouth every 8 (eight) hours as needed for nausea or vomiting. 10/09/20   Isla Pence, MD  OZEMPIC, 0.25 OR 0.5 MG/DOSE, 2 MG/1.5ML SOPN Inject 0.25 mg into the skin once a week. Newly prescribed 10/13/20   [provider]  potassium chloride SA (K-DUR,KLOR-CON) 20 MEQ tablet Take 40 mEq by mouth 2 (two) times daily.    [provider]  Probiotic Product (PROBIOTIC PO) Take 1 tablet by mouth daily.    [provider]  Semaglutide,0.25 or 0.'5MG'$ /DOS, (OZEMPIC, 0.25 OR 0.5 MG/DOSE,) 2 MG/1.5ML SOPN Inject 0.5 mg subcutaneously once a week 11/17/20     ticagrelor (BRILINTA) 90 MG TABS tablet Take 1 tablet (90 mg total) by mouth 2 (two) times daily for 21 days. 11/18/20     ticagrelor (BRILINTA) 90 MG TABS tablet Take 1 tablet (90 mg total) by mouth 2 (two) times daily 11/21/20     zinc gluconate 50 MG tablet Take 50 mg by mouth daily as needed (flu season).    [provider]     Family History  Problem Relation Age of Onset   Heart failure Mother    Diabetes Mother    Stroke  Mother    Stroke Father    Heart failure Father    Cancer Father     Social History   Socioeconomic History   Marital status: Married    Spouse name: Glendell Docker   Number of children: 2   Years of education: 14   Highest education level: Not on file  Occupational History    Comment: NA, disabled  Tobacco Use   Smoking status: Never   Smokeless tobacco: Never  Vaping Use   Vaping Use: Never used  Substance and Sexual Activity   Alcohol use: No   Drug use: No   Sexual activity: Not on file  Other Topics Concern   Not on file  Social History Narrative   Lives with husband and son   Social Determinants of Health   Financial Resource Strain: Not on file  Food Insecurity: Not on file  Transportation Needs: Not on file  Physical Activity: Not on file  Stress: Not on file  Social Connections: Not on file    ECOG Status: 2 - Symptomatic, <50% confined to bed  Review of Systems  Review of Systems: A 12 point ROS discussed and pertinent positives are indicated in the HPI above.  All other systems are negative.  Physical Exam No direct physical exam was performed (except for noted visual exam findings with Video Visits).   Vital Signs: There were no vitals taken for this visit.  Imaging:  CT-guided right renal cryoablation - 11/15/2018; 08/05/2017 Abdominal MRI - 06/13/2017 CT abdomen and pelvis - 09/21/2017; 03/15/2018; 02/13/2019; 08/10/2019; 11/16/2019; 06/03/2020; 11/21/2020  Personal review of most recent surveillance abdominal CT performed 11/21/2020 demonstrates continued involution of the cryoablation site about the superior medial aspect of the right kidney (image 43, series 10) as well as stable appearance of the prior ablation defect within the posterior medial interpolar aspect of the right kidney (image 50, series 10) without new worrisome renal lesion.  CT ABDOMEN W WO CONTRAST  Result Date: 11/22/2020 CLINICAL DATA:  History of right renal cell carcinoma status post  renal ablation in 2019 and 2020. EXAM: CT ABDOMEN WITHOUT AND WITH  CONTRAST TECHNIQUE: Multidetector CT imaging of the abdomen was performed following the standard protocol before and following the bolus administration of intravenous contrast. CONTRAST:  54m OMNIPAQUE IOHEXOL 350 MG/ML SOLN COMPARISON:  06/03/2020 FINDINGS: Lower chest: No acute abnormality.Stable left lower lobe lung nodule measuring 5 mm, image 10/7. This has been stable over multiple prior studies compatible with a benign nodule. Hepatobiliary: No focal liver abnormality is seen. Status post cholecystectomy. No biliary dilatation. Pancreas: Unremarkable. No pancreatic ductal dilatation or surrounding inflammatory changes. Spleen: Normal in size without focal abnormality. Adrenals/Urinary Tract: Normal adrenal glands. Bilateral renal cortical lobulation/scarring noted. The ablation defect involving the upper pole of the right kidney measures 1.3 by 1.1 cm, image 43/10. This is compared with 1.7 x 1.1 cm previously. There is no abnormal arterial phase or delayed phase enhancement within this area to suggest residual or recurrent tumor. Small cysts noted within upper pole left kidney measuring 9 mm. Additional too small to characterize low-attenuation kidney lesions appear unchanged. No hydronephrosis or perinephric fluid collections identified. Stomach/Bowel: Stomach is within normal limits. No evidence of bowel wall thickening, distention, or inflammatory changes. Vascular/Lymphatic: Aortic atherosclerosis. No aneurysm. No abdominal adenopathy. Other: No abdominal wall hernia or abnormality. Musculoskeletal: No acute or suspicious osseous findings. IMPRESSION: 1. Stable to slight decrease in size of ablation defect involving the upper pole of right kidney. No findings to suggest residual or recurrent tumor. 2. Stable 5 mm left lower lobe lung nodule. This is compatible with a benign nodule. 3. Aortic atherosclerosis. Aortic Atherosclerosis  (ICD10-I70.0). Electronically Signed   By: TKerby MoorsM.D.   On: 11/22/2020 09:29   CUP PACEART REMOTE DEVICE CHECK  Result Date: 11/24/2020 ILR summary report received. Battery status OK. Normal device function. No new symptom, tachy, brady, or pause episodes. No new AF episodes. Monthly summary reports and ROV/PRN LR   Labs:  CBC: Recent Labs    03/09/20 0556 03/09/20 0609 10/09/20 0427 10/15/20 1025 10/15/20 1032 10/16/20 0928  WBC 7.0  --  6.2 6.7  --  5.8  HGB 12.7   < > 12.1 12.8 13.3 12.6  HCT 40.6   < > 37.3 39.2 39.0 38.6  PLT 277  --  238 237  --  256   < > = values in this interval not displayed.    COAGS: Recent Labs    03/09/20 0556 10/15/20 1025  INR 0.9 1.0  APTT 29 28    BMP: Recent Labs    03/09/20 0556 03/09/20 0609 10/09/20 0427 10/15/20 1025 10/15/20 1032 10/16/20 0928 11/21/20 1007  NA 139   < > 140 139 141 135  --   K 3.7   < > 3.8 3.7 3.7 3.7  --   CL 104   < > 104 106 105 101  --   CO2 22  --  24 22  --  23  --   GLUCOSE 216*   < > 218* 258* 250* 357*  --   BUN 11   < > '19 19 20 16  '$ --   CALCIUM 8.1*  --  8.4* 8.3*  --  8.8*  --   CREATININE 1.13*   < > 1.30* 1.19* 1.00 1.15* 0.90  GFRNONAA 57*  --  48* 53*  --  55*  --    < > = values in this interval not displayed.    LIVER FUNCTION TESTS: Recent Labs    03/09/20 0556 10/15/20 1025  BILITOT 0.5 0.8  AST 21  18  ALT 22 18  ALKPHOS 57 58  PROT 6.8 6.7  ALBUMIN 3.6 3.6    TUMOR MARKERS: No results for input(s): AFPTM, CEA, CA199, CHROMGRNA in the last 8760 hours.  Assessment and Plan:  [name] is a 60 y.o. female is a 59 year old female with past medical history significant for diabetes, hypertension, hyperlipidemia and thyroid cancer who initially underwent image guided right-sided renal cryoablation on 08/05/2017 of 2 right-sided renal lesions (superior pole and posterior interpolar lesions) who was found to have residual disease involving the superior pole  lesion for which she underwent a repeat renal cryoablation on 11/15/2018.     She is seen in follow-up consultation via telemedicine following the acquisition of postprocedural CT scan performed 11/24/2020.   CT-guided right renal cryoablation - 11/15/2018; 08/05/2017 Abdominal MRI - 06/13/2017 CT abdomen and pelvis - 09/21/2017; 03/15/2018; 02/13/2019; 08/10/2019; 11/16/2019; 06/03/2020; 11/21/2020  Personal review of most recent surveillance abdominal CT performed 11/21/2020 demonstrates continued involution of the cryoablation site about the superior medial aspect of the right kidney (image 43, series 10) as well as stable appearance of the prior ablation defect within the posterior medial interpolar aspect of the right kidney (image 50, series 10) without new worrisome renal lesion.   This examination documents 2 years of stability since undergoing the final cryoablation performed 11/15/2018.  As such we perform our final surveillance examination in 1 year.  Note, surveillance imaging will again be performed with CT given patient's history of MRI contrast allergy.   Patient demonstrated excellent understanding of the above conversation and is in agreement with the proposed plan of care.   Plan: - Telemedicine consultation following acquisition of final surveillance renal protocol CT scan in 1 year (August 2023).  Thank you for this interesting consult.  I greatly enjoyed meeting [name] and look forward to participating in their care.  A copy of this report was sent to the requesting provider on this date.  Electronically Signed: Sandi Mariscal 11/27/2020, 8:21 AM   I spent a total of 5 Minutes in remote  clinical consultation, greater than 50% of which was counseling/coordinating care for post right sided renal cryoablation.    Visit type: Audio only (telephone). Audio (no video) only due to patient's lack of internet/smartphone capability. Alternative for in-person consultation at [facility], [address] [place], [place], [place]. This visit type was conducted due to national recommendations for restrictions regarding the COVID-19 Pandemic (e.g. social distancing).  This format is felt to be most appropriate for this patient at this time.  All issues noted in this document were discussed and addressed.

## 2020-12-04 ENCOUNTER — Other Ambulatory Visit: Payer: Self-pay

## 2020-12-04 ENCOUNTER — Ambulatory Visit (INDEPENDENT_AMBULATORY_CARE_PROVIDER_SITE_OTHER): Payer: Medicare Other | Admitting: Adult Health

## 2020-12-04 ENCOUNTER — Encounter: Payer: Self-pay | Admitting: Adult Health

## 2020-12-04 ENCOUNTER — Other Ambulatory Visit (HOSPITAL_COMMUNITY): Payer: Self-pay

## 2020-12-04 VITALS — BP 125/91 | HR 97 | Ht 67.0 in | Wt 216.0 lb

## 2020-12-04 DIAGNOSIS — R419 Unspecified symptoms and signs involving cognitive functions and awareness: Secondary | ICD-10-CM | POA: Diagnosis not present

## 2020-12-04 DIAGNOSIS — G459 Transient cerebral ischemic attack, unspecified: Secondary | ICD-10-CM

## 2020-12-04 DIAGNOSIS — R2689 Other abnormalities of gait and mobility: Secondary | ICD-10-CM | POA: Diagnosis not present

## 2020-12-04 DIAGNOSIS — E559 Vitamin D deficiency, unspecified: Secondary | ICD-10-CM | POA: Diagnosis not present

## 2020-12-04 MED ORDER — CLOPIDOGREL BISULFATE 75 MG PO TABS
75.0000 mg | ORAL_TABLET | Freq: Every day | ORAL | 11 refills | Status: DC
  Filled 2020-12-04: qty 30, 30d supply, fill #0
  Filled 2021-01-05: qty 30, 30d supply, fill #1
  Filled 2021-02-13: qty 30, 30d supply, fill #2
  Filled 2021-03-22: qty 30, 30d supply, fill #3
  Filled 2021-04-14: qty 30, 30d supply, fill #4
  Filled 2021-05-16: qty 30, 30d supply, fill #5

## 2020-12-04 MED ORDER — METOPROLOL TARTRATE 100 MG PO TABS
ORAL_TABLET | ORAL | 5 refills | Status: DC
  Filled 2020-12-04: qty 60, 30d supply, fill #0
  Filled 2021-01-05: qty 60, 30d supply, fill #1
  Filled 2021-02-04: qty 60, 30d supply, fill #2
  Filled 2021-02-13 – 2021-03-03 (×2): qty 60, 30d supply, fill #3
  Filled 2021-04-06: qty 60, 30d supply, fill #4
  Filled 2021-05-04: qty 60, 30d supply, fill #5

## 2020-12-04 MED ORDER — PANTOPRAZOLE SODIUM 40 MG PO TBEC
40.0000 mg | DELAYED_RELEASE_TABLET | Freq: Every day | ORAL | 3 refills | Status: DC
  Filled 2020-12-04: qty 90, 90d supply, fill #0
  Filled 2021-03-02: qty 90, 90d supply, fill #1
  Filled 2021-05-26: qty 90, 90d supply, fill #2
  Filled 2021-08-25: qty 90, 90d supply, fill #3

## 2020-12-04 NOTE — Patient Instructions (Signed)
We will check lab work today to look for reversible causes of memory concerns  Orders placed for physical and speech (cognitive) therapies  Restart clopidogrel 75 mg daily  and continue atorvastatin  for secondary stroke prevention  Stop Brilinta at this time  Continue to follow up with PCP/endocrinology regarding cholesterol, blood pressure and diabetes management  Maintain strict control of hypertension with blood pressure goal below 130/90, diabetes with hemoglobin A1c goal below 7% and cholesterol with LDL cholesterol (bad cholesterol) goal below 70 mg/dL.       Followup in the future with me in 6 months or call earlier if needed       Thank you for coming to see [place] at [facility]. I hope we have been able to provide you high quality care today.  You may receive a patient satisfaction survey over the next few weeks. We would appreciate your feedback and comments so that we may continue to improve ourselves and the health of our patients.

## 2020-12-04 NOTE — Progress Notes (Addendum)
[facility]  PATIENT: [name] DOB: 1961/02/15  REFERRING CLINICIAN: Einar Pheasant, NP HISTORY FROM: patient REASON FOR VISIT: TIA hospital follow-up   HISTORICAL  CHIEF COMPLAINT:  Chief Complaint  Patient presents with   Follow-up    Rm 3 alone Pt is well, has some weakness in legs more so the L, some confusion and imbalance.      HISTORY OF PRESENT ILLNESS:   Update 12/04/2020 JM: Seen by Dr. Leta Baptist 5/9 for TIA.  Had another TIA event 7/20 with left-sided weakness and slurred speech.  MRI brain negative for acute stroke.  CTA head/neck 50% stenosis R VA and minimal arthrosclerotic change left carotid bifurcation.  LDL 81.  A1c 9.0.  Initiated Brilinta and aspirin for 30 days and aspirin alone and continuation of atorvastatin.  Loop recorder negative for atrial fibrillation.  C/o continued gait difficulties with imbalance, lower extremity weakness as well as cognitive concerns all present since admission.  Continues to ambulate with a cane.  Denies any recent falls.  She does endorse increased stressors continues to reside with her husband and looks after her grandchildren while her daughter is at work.  She discontinued aspirin due to GI upset and has remained on Brilinta.  Remains on atorvastatin 40 mg daily.  Blood pressure today 125/91.  Continues to follow with endocrinology for diabetes and hypothyroidism management.  No further concerns at this time.    UPDATE (08/04/20, VRP): Since last visit, doing well until Dec 2021 --> had another TIA event (left face drooping, left sided numbness, x 1 hour). Then resolved. TIA workup completed at hospital. Now back to baseline. Has implanted loop recorder currently.   PRIOR HPI ([id number]): 60 year old female here for evaluation of possible transient ischemic attacks.   June 2017 patient had onset of numbness, tingling, lip twitching, nausea and vomiting. Patient was admitted for stroke workup. MRI of the  brain was negative. Stroke risk factors were identified and treated.  February 2018 patient had onset of left arm and face numbness and weakness lasting for 4-6 hours. Patient was admitted for workup. MRI of the brain was negative for acute stroke. Stroke risk factors were evaluated and treated.  Since that time symptoms have resolved. Patient continues to have other issues including memory loss, headaches, numbness, weakness, dizziness, not asleep, joint pain, increased thirst, short of breath, fevers, chills, fatigue, chest pain, swelling of legs, ringing in ears, spinning sensation.  Patient has history of diabetes, hypertension, thyroid cancer, irregular heartbeat/atrial fibrillation in the past.    REVIEW OF SYSTEMS: Full 14 system review of systems performed and negative with exception of: As per history of present illness.   ALLERGIES: Allergies  Allergen Reactions   Lorabid [Loracarbef] Anaphylaxis   Amoxicillin Other (See Comments)   Augmentin [Amoxicillin-Pot Clavulanate] Hives, Nausea And Vomiting and Other (See Comments)    Chest pain   Codeine Nausea And Vomiting   Gadolinium Derivatives Itching, Swelling and Other (See Comments)    Patient will require 13 hr prep prior to administration of gadolinium     Sulfa Drugs Cross Reactors Other (See Comments)    Severe allergic reaction as a child - was not told symptoms   Benicar [Olmesartan Medoxomil] Swelling and Rash   Other Rash    Green peas    HOME MEDICATIONS: Outpatient Medications Prior to Visit  Medication Sig Dispense Refill   acetaminophen (TYLENOL) 500 MG tablet Take 2 tablets (1,000 mg total) by mouth every 6 (six) hours  as needed (body aches). (Patient taking differently: Take 1,000 mg by mouth every 8 (eight) hours as needed for mild pain. Alternates w Ibuprofen) 30 tablet 0   albuterol (PROVENTIL HFA;VENTOLIN HFA) 108 (90 Base) MCG/ACT inhaler Inhale 1-2 puffs into the lungs every 6 (six) hours as needed for  wheezing or shortness of breath. 1 Inhaler 0   atorvastatin (LIPITOR) 40 MG tablet Take 1 tablet (40 mg total) by mouth at bedtime. 30 tablet 3   CALCIUM-MAGNESIUM-ZINC PO Take 2 tablets by mouth 2 (two) times daily.     diclofenac Sodium (VOLTAREN) 1 % GEL Apply to affected area(s) twice daily as directed 100 g 0   diphenhydrAMINE (BENADRYL) 25 MG tablet Take 25 mg by mouth at bedtime.     fluticasone (FLONASE) 50 MCG/ACT nasal spray Place 2 sprays into both nostrils daily.     furosemide (LASIX) 40 MG tablet Take 2 tablets (80 mg total) by mouth 2 (two) times daily.     ibuprofen (ADVIL) 200 MG tablet Take 800 mg by mouth every 8 (eight) hours as needed for headache or moderate pain.     levothyroxine (SYNTHROID) 300 MCG tablet Take 1 tablet by mouth daily for 6 days a week then 1/2 tablet on Saturday 21 tablet 0   lisinopril (PRINIVIL,ZESTRIL) 10 MG tablet Take 10 mg by mouth daily.   5   metFORMIN (GLUCOPHAGE) 500 MG tablet Take 1 tablet (500 mg total) by mouth 2 (two) times daily with a meal. 60 tablet 11   metoprolol tartrate (LOPRESSOR) 100 MG tablet Take 100 mg by mouth 2 (two) times daily.     mupirocin ointment (BACTROBAN) 2 % Apply 1 application topically in the morning and at bedtime. Mole removal site on back     nitrofurantoin, macrocrystal-monohydrate, (MACROBID) 100 MG capsule Take 1 capsule (100 mg total) by mouth 2 (two) times daily. 10 capsule 0   ondansetron (ZOFRAN ODT) 4 MG disintegrating tablet Take 1 tablet (4 mg total) by mouth every 8 (eight) hours as needed for nausea or vomiting. 10 tablet 0   potassium chloride SA (K-DUR,KLOR-CON) 20 MEQ tablet Take 40 mEq by mouth 2 (two) times daily.     Probiotic Product (PROBIOTIC PO) Take 1 tablet by mouth daily.     Semaglutide,0.25 or 0.'5MG'$ /DOS, (OZEMPIC, 0.25 OR 0.5 MG/DOSE,) 2 MG/1.5ML SOPN Inject 0.5 mg subcutaneously once a week 2 mL 1   zinc gluconate 50 MG tablet Take 50 mg by mouth daily as needed (flu season).      aspirin 81 MG EC tablet Take 1 tablet (81 mg total) by mouth daily. Swallow whole. 30 tablet 11   omeprazole (PRILOSEC OTC) 20 MG tablet Take 20 mg by mouth daily.     ticagrelor (BRILINTA) 90 MG TABS tablet Take 1 tablet (90 mg total) by mouth 2 (two) times daily 180 tablet 2   atorvastatin (LIPITOR) 40 MG tablet Take 1 tablet by mouth at bedtime. 30 tablet 3   atorvastatin (LIPITOR) 40 MG tablet Take 1 tablet (40 mg total) by mouth at bedtime. 30 tablet 0   levothyroxine (SYNTHROID, LEVOTHROID) 300 MCG tablet Take 150-300 mcg by mouth See admin instructions. Take 300 mcg by mouth for 6 days and take 150 mcg on day 7 (Sunday)     OZEMPIC, 0.25 OR 0.5 MG/DOSE, 2 MG/1.5ML SOPN Inject 0.25 mg into the skin once a week. Newly prescribed     ticagrelor (BRILINTA) 90 MG TABS tablet Take 1 tablet (90 mg  total) by mouth 2 (two) times daily for 21 days. 42 tablet 2   No facility-administered medications prior to visit.    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 "   Asthma    due to allergies    Cancer ([place])    THRYOID 2016 [place], KIDNEY CANCER MAY 2018  RIGHT RENAL CRYOABLATION   Complication of anesthesia    Diabetes mellitus    TYPE 2   Dyspnea    increased exertion    Dysrhythmia    " irregular heart rate per patient"   Family history of adverse reaction to anesthesia    difficult for father to wake after surgery, AFTHER LOW BP WITH ANESTHESIA   Heart murmur    HOH (hard of hearing)    Hypercholesteremia    Hypertension    PONV (postoperative nausea and vomiting)    Renal cancer, right ([place]) dx'd 07/2018, 10/2018   ablation x 2    Thyroid disease    TIA (transient ischemic attack)    LAST TIA  APRIL 2020 ON PLAVIX, reports residual effects ,02/2020    PAST SURGICAL HISTORY: Past Surgical History:  Procedure Laterality Date   ABDOMINAL HYSTERECTOMY     COMPLETE   BIOPSY  03/02/2019   Procedure:  BIOPSY;  Surgeon: Carol Ada, MD;  Location: [facility];  Service: Endoscopy;;   CHOLECYSTECTOMY     COLON SURGERY     COLONOSCOPY WITH PROPOFOL N/A 03/02/2019   Procedure: COLONOSCOPY WITH PROPOFOL;  Surgeon: Carol Ada, MD;  Location: [facility];  Service: Endoscopy;  Laterality: N/A;   ear surgeries      implantable loop recorder placement  06/02/2020   Medtronic Reveal Linq model LNQ22 implantable loop recorder (SN [id number])    IR RADIOLOGIST EVAL & MGMT  01/12/2017   IR RADIOLOGIST EVAL & MGMT  06/16/2017   IR RADIOLOGIST EVAL & MGMT  08/31/2017   IR RADIOLOGIST EVAL & MGMT  12/13/2017   IR RADIOLOGIST EVAL & MGMT  03/16/2018   IR RADIOLOGIST EVAL & MGMT  11/01/2018   IR RADIOLOGIST EVAL & MGMT  12/13/2018   IR RADIOLOGIST EVAL & MGMT  02/15/2019   IR RADIOLOGIST EVAL & MGMT  08/16/2019   IR RADIOLOGIST EVAL & MGMT  11/22/2019   IR RADIOLOGIST EVAL & MGMT  06/05/2020   IR RADIOLOGIST EVAL & MGMT  11/27/2020   KNEE ARTHROSCOPY     LEFT HEART CATHETERIZATION WITH CORONARY ANGIOGRAM N/A 04/24/2014   Procedure: LEFT HEART CATHETERIZATION WITH CORONARY ANGIOGRAM;  Surgeon: Burnell Blanks, MD;  Location[facility] C[facility] LAB;  Service: Cardiovascular;  Laterality: N/A;   POLYPECTOMY  03/02/2019   Procedure: POLYPECTOMY;  Surgeon: Carol Ada, MD;  Location: [facility];  Service: Endoscopy;;   RADIOFREQUENCY ABLATION Right 08/05/2017   Procedure: CT RENAL CRYO AND BIOPSY;  Surgeon: Sandi Mariscal, MD;  Location: [facility];  Service: Anesthesiology;  Laterality: Right;   RADIOFREQUENCY ABLATION Right 11/15/2018   Procedure: RIGHT RENAL CRYOABLATION;  Surgeon: Sandi Mariscal, MD;  Location: [facility];  Service: Anesthesiology;  Laterality: Right;   THYROIDECTOMY N/A 09/20/2014   Procedure: TOTAL THYROIDECTOMY;  Surgeon: Armandina Gemma, MD;  Location: [facility];  Service: General;  Laterality: N/A;   TONSILLECTOMY      FAMILY HISTORY: Family History  Problem Relation Age of Onset   Heart failure Mother  Diabetes Mother    Stroke Mother    Stroke Father    Heart failure Father    Cancer Father     SOCIAL HISTORY:  Social History   Socioeconomic History   Marital status: Married    Spouse name: Glendell Docker   Number of children: 2   Years of education: 14   Highest education level: Not on file  Occupational History    Comment: NA, disabled  Tobacco Use   Smoking status: Never   Smokeless tobacco: Never  Vaping Use   Vaping Use: Never used  Substance and Sexual Activity   Alcohol use: No   Drug use: No   Sexual activity: Not on file  Other Topics Concern   Not on file  Social History Narrative   Lives with husband and son   Social Determinants of Health   Financial Resource Strain: Not on file  Food Insecurity: Not on file  Transportation Needs: Not on file  Physical Activity: Not on file  Stress: Not on file  Social Connections: Not on file  Intimate Partner Violence: Not on file     PHYSICAL EXAM  GENERAL EXAM/CONSTITUTIONAL: Vitals:  Vitals:   12/04/20 0904  BP: (!) 125/91  Pulse: 97  Weight: 216 lb (98 kg)  Height: '5\' 7"'$  (1.702 m)    Body mass index is 33.83 kg/m. No results found. Patient is in no distress; well developed, nourished and groomed; neck is supple  CARDIOVASCULAR: Examination of carotid arteries is normal; no carotid bruits Regular rate and rhythm, no murmurs Examination of peripheral vascular system by observation and palpation is normal  EYES: Ophthalmoscopic exam of optic discs and posterior segments is normal; no papilledema or hemorrhages  MUSCULOSKELETAL: Gait, strength, tone, movements noted in Neurologic exam below  NEUROLOGIC: MENTAL STATUS:  awake, alert, oriented to person, place and time recent memory subjectively impaired and remote memory intact normal attention and concentration language fluent, comprehension intact, naming intact,  fund of knowledge appropriate  CRANIAL NERVE:  2nd - no papilledema on  fundoscopic exam 2nd, 3rd, 4th, 6th - pupils equal and reactive to light, visual fields full to confrontation, extraocular muscles intact, no nystagmus 5th - facial sensation symmetric 7th - facial strength symmetric 8th - hearing intact 9th - palate elevates symmetrically, uvula midline 11th - shoulder shrug symmetric 12th - tongue protrusion midline  MOTOR:  normal bulk and tone, full strength in the BUE, BLE  SENSORY:  normal and symmetric to light touch ABSENT VIB AT TOES, ANKLES AND KNEES  COORDINATION:  finger-nose-finger, fine finger movements normal  REFLEXES:  deep tendon reflexes TRACE and symmetric  GAIT/STATION:  ANTALGIC, LIMPING GAIT; LEFT LEG PAIN    DIAGNOSTIC DATA (LABS, IMAGING, TESTING) - I reviewed patient records, labs, notes, testing and imaging myself where available.  Lab Results  Component Value Date   WBC 5.8 10/16/2020   HGB 12.6 10/16/2020   HCT 38.6 10/16/2020   MCV 90.2 10/16/2020   PLT 256 10/16/2020      Component Value Date/Time   NA 135 10/16/2020 0928   K 3.7 10/16/2020 0928   CL 101 10/16/2020 0928   CO2 23 10/16/2020 0928   GLUCOSE 357 (H) 10/16/2020 0928   BUN 16 10/16/2020 0928   CREATININE 0.90 11/21/2020 1007   CREATININE 1.15 (H) 12/11/2018 0751   CALCIUM 8.8 (L) 10/16/2020 0928   PROT 6.7 10/15/2020 1025   ALBUMIN 3.6 10/15/2020 1025   AST 18 10/15/2020 1025   ALT 18  10/15/2020 1025   ALKPHOS 58 10/15/2020 1025   BILITOT 0.8 10/15/2020 1025   GFRNONAA 55 (L) 10/16/2020 0928   GFRNONAA 53 (L) 12/11/2018 0751   GFRAA 62 12/11/2018 0751   Lab Results  Component Value Date   CHOL 181 10/15/2020   HDL 38 (L) 10/15/2020   LDLCALC 81 10/15/2020   TRIG 311 (H) 10/15/2020   CHOLHDL 4.8 10/15/2020   Lab Results  Component Value Date   HGBA1C 9.0 (H) 10/15/2020   No results found for: VITAMINB12 Lab Results  Component Value Date   TSH <0.010 (L) 06/26/2017     09/13/15 CT soft tissue neck - LEFT parotid  appears slightly larger than the RIGHT, correlate clinically for mild inflammatory change in the LEFT parotid tail. - LEFT paramedian medial incisor periapical lucency. Uncertain significance with regard and jaw pain. - No mandibular periodontal disease or other significant abnormality.   - Status post thyroidectomy.  No concerning lymphadenopathy.  05/16/16 MRI brain - No acute intracranial abnormality. Stable since 2017 and largely unremarkable for age noncontrast MRI appearance of the brain.  05/16/16 MRA head  - Stable and negative intracranial MRA.  05/17/16 carotid u/s - The vertebral arteries appear patent with antegrade flow. - Findings consistent with 1-39 percent stenosis involving the right internal carotid artery and the left internal carotid artery.  05/17/16 TTE  - Left ventricle: The cavity size was normal. Wall thickness was   increased in a pattern of mild LVH. Systolic function was normal.   The estimated ejection fraction was in the range of 60% to 65%.   Left ventricular diastolic function parameters were normal. - Left atrium: The atrium was mildly dilated. - Atrial septum: No defect or patent foramen ovale was identified.  Dec 2021 TIA workup (per Dr Erlinda Hong note 03/10/20): CT head No acute abnormality. Mild Small vessel disease.  MRI  No acute abnormality. Mild Small vessel disease.  CTA head & neck no LVO. Minimal atherosclerosis. Partially empty sella w/ effaced transverse and sigmoid sinus. Coronary atherosclerosis. 2D Echo EF 60-65% LDL 54 HgbA1c 8.4  09/2020 TIA work up Bruno IV Contrast 1. Normal head CT for age. 2. ASPECTS is 10.   CT Angio Head and Neck W WO IV Contrast No acute large vessel occlusion.   Minimal atherosclerotic change at the left carotid bifurcation but no stenosis.   50% stenosis of the right vertebral artery origin. Posterior circulation appears otherwise normal.   MRI Brain WO IV Contrast No acute or significant finding. Mild  chronic small-vessel ischemic change of the hemispheric white matter, unchanged since last year.   Echocardiogram Complete  EF 60 to 65%     ASSESSMENT AND PLAN  60 y.o. year old female here with hypertension, diabetes, with 3 episodes of strokelike symptoms with most recent 09/2018, with negative MRI brain and stroke work-up.     PLAN:  TIA vs complicated migraine -Restart Plavix 75 mg daily in setting of aspirin intolerance; s/p Brilinta and aspirin for 30 days (advised to stop Brilinta) -Continue atorvastatin 40 mg daily -Routine follow-up with PCP for aggressive stroke risk factor management including HTN with BP goal<130/90, HLD with LDL goal<70 and DM with A1c goal<7 -Loop recorder has not shown atrial fibrillation thus far  Gait impairment -Seems to be more chronic issue -referral placed to PT  Memory complaints -Likely multifactorial -will obtain B12 and vit D -Referral placed to SLP for cognitive therapy   Return in about 6 months (around  06/03/2021).   CC:  [facility] provider: Dr. Leta Baptist (sent to work in MD Dr Rexene Alberts for review) Lin Landsman, MD   I spent 43 minutes of face-to-face and non-face-to-face time with patient.  This included previsit chart review including review of recent hospitalization, lab review, study review, order entry, electronic health record documentation, patient education regarding above topics, possible etiologies and further treatment options and answered all other questions to patient satisfaction  Frann Rider, [facility]  [facility] [address] [place], [place] [address]  Phone [phone] Fax [phone] Note: This document was prepared with digital dictation and possible smart phrase technology. Any transcriptional errors that result from this process are unintentional.  I reviewed the above note and documentation by the Nurse Practitioner and agree with the history, exam, assessment and plan as  outlined above. I was available for consultation. Star Age, MD, PhD [facility])

## 2020-12-05 LAB — B12 AND FOLATE PANEL
Folate: 8.1 ng/mL (ref >3.0)
Vitamin B-12: 354 pg/mL (ref 232–1245)

## 2020-12-05 LAB — VITAMIN D 25 HYDROXY (VIT D DEFICIENCY, FRACTURES): Vit D, 25-Hydroxy: 27.2 ng/mL — ABNORMAL LOW (ref 30.0–100.0)

## 2020-12-05 NOTE — Progress Notes (Signed)
Carelink Summary Report / Loop Recorder 

## 2020-12-06 ENCOUNTER — Other Ambulatory Visit (HOSPITAL_COMMUNITY): Payer: Self-pay

## 2020-12-08 ENCOUNTER — Other Ambulatory Visit (HOSPITAL_COMMUNITY): Payer: Self-pay

## 2020-12-08 MED ORDER — LEVOTHYROXINE SODIUM 200 MCG PO TABS
200.0000 ug | ORAL_TABLET | Freq: Every day | ORAL | 3 refills | Status: DC
  Filled 2020-12-08: qty 90, 90d supply, fill #0
  Filled 2021-03-24: qty 90, 90d supply, fill #1
  Filled 2021-06-15: qty 90, 90d supply, fill #2

## 2020-12-08 MED ORDER — LEVOTHYROXINE SODIUM 300 MCG PO TABS
ORAL_TABLET | ORAL | 0 refills | Status: DC
  Filled 2020-12-08: qty 90, 90d supply, fill #0

## 2020-12-09 ENCOUNTER — Other Ambulatory Visit (HOSPITAL_COMMUNITY): Payer: Self-pay

## 2020-12-10 ENCOUNTER — Other Ambulatory Visit (HOSPITAL_COMMUNITY): Payer: Self-pay

## 2020-12-12 ENCOUNTER — Other Ambulatory Visit (HOSPITAL_COMMUNITY): Payer: Self-pay

## 2020-12-15 ENCOUNTER — Other Ambulatory Visit (HOSPITAL_COMMUNITY): Payer: Self-pay

## 2020-12-15 MED ORDER — LISINOPRIL 10 MG PO TABS
ORAL_TABLET | ORAL | 3 refills | Status: DC
  Filled 2020-12-15: qty 30, 30d supply, fill #0
  Filled 2021-01-10: qty 30, 30d supply, fill #1
  Filled 2021-02-13: qty 30, 30d supply, fill #2
  Filled 2021-03-22: qty 30, 30d supply, fill #3

## 2020-12-16 ENCOUNTER — Other Ambulatory Visit (HOSPITAL_COMMUNITY): Payer: Self-pay

## 2020-12-16 MED ORDER — POTASSIUM CHLORIDE CRYS ER 20 MEQ PO TBCR
EXTENDED_RELEASE_TABLET | ORAL | 1 refills | Status: DC
  Filled 2020-12-16 (×2): qty 120, 30d supply, fill #0
  Filled 2021-01-14: qty 120, 30d supply, fill #1

## 2020-12-22 ENCOUNTER — Other Ambulatory Visit (HOSPITAL_COMMUNITY): Payer: Self-pay

## 2020-12-22 MED ORDER — BENZONATATE 200 MG PO CAPS
ORAL_CAPSULE | ORAL | 1 refills | Status: DC
  Filled 2020-12-22: qty 30, 10d supply, fill #0
  Filled 2021-03-11: qty 30, 10d supply, fill #1

## 2020-12-22 MED ORDER — LAGEVRIO 200 MG PO CAPS
ORAL_CAPSULE | ORAL | 0 refills | Status: DC
  Filled 2020-12-22: qty 40, 5d supply, fill #0

## 2020-12-23 ENCOUNTER — Other Ambulatory Visit (HOSPITAL_COMMUNITY): Payer: Self-pay

## 2020-12-23 MED ORDER — ALBUTEROL SULFATE HFA 108 (90 BASE) MCG/ACT IN AERS
INHALATION_SPRAY | RESPIRATORY_TRACT | 2 refills | Status: AC
  Filled 2020-12-23: qty 18, 30d supply, fill #0

## 2020-12-24 ENCOUNTER — Other Ambulatory Visit (HOSPITAL_COMMUNITY): Payer: Self-pay

## 2020-12-27 ENCOUNTER — Other Ambulatory Visit (HOSPITAL_COMMUNITY): Payer: Self-pay

## 2020-12-29 ENCOUNTER — Ambulatory Visit (INDEPENDENT_AMBULATORY_CARE_PROVIDER_SITE_OTHER): Payer: Medicare Other

## 2020-12-29 DIAGNOSIS — G459 Transient cerebral ischemic attack, unspecified: Secondary | ICD-10-CM

## 2020-12-29 LAB — CUP PACEART REMOTE DEVICE CHECK
Date Time Interrogation Session: 20220925174555
Implantable Pulse Generator Implant Date: 20220307

## 2021-01-05 ENCOUNTER — Other Ambulatory Visit (HOSPITAL_COMMUNITY): Payer: Self-pay

## 2021-01-06 NOTE — Progress Notes (Signed)
Carelink Summary Report / Loop Recorder 

## 2021-01-10 ENCOUNTER — Other Ambulatory Visit (HOSPITAL_COMMUNITY): Payer: Self-pay

## 2021-01-12 ENCOUNTER — Other Ambulatory Visit (HOSPITAL_COMMUNITY): Payer: Self-pay

## 2021-01-14 ENCOUNTER — Other Ambulatory Visit (HOSPITAL_COMMUNITY): Payer: Self-pay

## 2021-01-20 ENCOUNTER — Other Ambulatory Visit (HOSPITAL_COMMUNITY): Payer: Self-pay

## 2021-01-21 ENCOUNTER — Other Ambulatory Visit (HOSPITAL_COMMUNITY): Payer: Self-pay

## 2021-01-22 ENCOUNTER — Other Ambulatory Visit (HOSPITAL_COMMUNITY): Payer: Self-pay

## 2021-01-22 MED ORDER — OZEMPIC (0.25 OR 0.5 MG/DOSE) 2 MG/1.5ML ~~LOC~~ SOPN
PEN_INJECTOR | SUBCUTANEOUS | 0 refills | Status: DC
  Filled 2021-01-22: qty 1.5, 28d supply, fill #0

## 2021-01-24 ENCOUNTER — Other Ambulatory Visit (HOSPITAL_COMMUNITY): Payer: Self-pay

## 2021-01-24 MED ORDER — OZEMPIC (0.25 OR 0.5 MG/DOSE) 2 MG/1.5ML ~~LOC~~ SOPN
0.5000 mg | PEN_INJECTOR | SUBCUTANEOUS | 1 refills | Status: DC
  Filled 2021-01-24 – 2021-02-13 (×2): qty 1.5, 28d supply, fill #0
  Filled 2021-03-12: qty 1.5, 28d supply, fill #1

## 2021-01-27 LAB — CUP PACEART REMOTE DEVICE CHECK
Date Time Interrogation Session: 20221028174537
Implantable Pulse Generator Implant Date: 20220307

## 2021-02-02 ENCOUNTER — Ambulatory Visit (INDEPENDENT_AMBULATORY_CARE_PROVIDER_SITE_OTHER): Payer: Medicare Other

## 2021-02-02 DIAGNOSIS — G459 Transient cerebral ischemic attack, unspecified: Secondary | ICD-10-CM

## 2021-02-04 ENCOUNTER — Other Ambulatory Visit (HOSPITAL_COMMUNITY): Payer: Self-pay

## 2021-02-06 ENCOUNTER — Other Ambulatory Visit (HOSPITAL_COMMUNITY): Payer: Self-pay

## 2021-02-10 NOTE — Progress Notes (Signed)
Carelink Summary Report / Loop Recorder 

## 2021-02-13 ENCOUNTER — Other Ambulatory Visit (HOSPITAL_COMMUNITY): Payer: Self-pay

## 2021-02-14 ENCOUNTER — Other Ambulatory Visit (HOSPITAL_COMMUNITY): Payer: Self-pay

## 2021-02-16 ENCOUNTER — Other Ambulatory Visit (HOSPITAL_COMMUNITY): Payer: Self-pay

## 2021-02-16 MED ORDER — POTASSIUM CHLORIDE CRYS ER 20 MEQ PO TBCR
EXTENDED_RELEASE_TABLET | ORAL | 1 refills | Status: DC
  Filled 2021-02-16: qty 120, 30d supply, fill #0
  Filled 2021-03-12: qty 120, 30d supply, fill #1

## 2021-02-17 ENCOUNTER — Other Ambulatory Visit (HOSPITAL_COMMUNITY): Payer: Self-pay

## 2021-03-03 ENCOUNTER — Other Ambulatory Visit (HOSPITAL_COMMUNITY): Payer: Self-pay

## 2021-03-03 MED ORDER — POTASSIUM CHLORIDE CRYS ER 20 MEQ PO TBCR
EXTENDED_RELEASE_TABLET | ORAL | 1 refills | Status: DC
  Filled 2021-03-03 – 2021-04-14 (×2): qty 120, 30d supply, fill #0

## 2021-03-04 LAB — CUP PACEART REMOTE DEVICE CHECK
Date Time Interrogation Session: 20221130174143
Implantable Pulse Generator Implant Date: 20220307

## 2021-03-09 ENCOUNTER — Ambulatory Visit (INDEPENDENT_AMBULATORY_CARE_PROVIDER_SITE_OTHER): Payer: Medicare Other

## 2021-03-09 DIAGNOSIS — G459 Transient cerebral ischemic attack, unspecified: Secondary | ICD-10-CM | POA: Diagnosis not present

## 2021-03-11 ENCOUNTER — Other Ambulatory Visit (HOSPITAL_COMMUNITY): Payer: Self-pay

## 2021-03-12 ENCOUNTER — Other Ambulatory Visit (HOSPITAL_COMMUNITY): Payer: Self-pay

## 2021-03-13 ENCOUNTER — Other Ambulatory Visit (HOSPITAL_COMMUNITY): Payer: Self-pay

## 2021-03-16 ENCOUNTER — Other Ambulatory Visit (HOSPITAL_COMMUNITY): Payer: Self-pay

## 2021-03-18 NOTE — Progress Notes (Signed)
Carelink Summary Report / Loop Recorder 

## 2021-03-24 ENCOUNTER — Other Ambulatory Visit: Payer: Self-pay | Admitting: Internal Medicine

## 2021-03-24 ENCOUNTER — Other Ambulatory Visit (HOSPITAL_COMMUNITY): Payer: Self-pay

## 2021-03-25 ENCOUNTER — Other Ambulatory Visit: Payer: Self-pay

## 2021-03-25 ENCOUNTER — Other Ambulatory Visit: Payer: Self-pay | Admitting: Internal Medicine

## 2021-03-25 ENCOUNTER — Other Ambulatory Visit (HOSPITAL_COMMUNITY): Payer: Self-pay

## 2021-03-25 ENCOUNTER — Emergency Department (HOSPITAL_COMMUNITY)
Admission: EM | Admit: 2021-03-25 | Discharge: 2021-03-25 | Disposition: A | Discharge status: Home / Self Care | Payer: Medicare Other | Attending: Emergency Medicine | Admitting: Emergency Medicine

## 2021-03-25 ENCOUNTER — Encounter (HOSPITAL_COMMUNITY): Payer: Self-pay | Admitting: Emergency Medicine

## 2021-03-25 DIAGNOSIS — Z85528 Personal history of other malignant neoplasm of kidney: Secondary | ICD-10-CM | POA: Insufficient documentation

## 2021-03-25 DIAGNOSIS — Z7984 Long term (current) use of oral hypoglycemic drugs: Secondary | ICD-10-CM | POA: Insufficient documentation

## 2021-03-25 DIAGNOSIS — I2510 Atherosclerotic heart disease of native coronary artery without angina pectoris: Secondary | ICD-10-CM | POA: Insufficient documentation

## 2021-03-25 DIAGNOSIS — T7840XA Allergy, unspecified, initial encounter: Secondary | ICD-10-CM | POA: Insufficient documentation

## 2021-03-25 DIAGNOSIS — J45909 Unspecified asthma, uncomplicated: Secondary | ICD-10-CM | POA: Diagnosis not present

## 2021-03-25 DIAGNOSIS — E119 Type 2 diabetes mellitus without complications: Secondary | ICD-10-CM | POA: Insufficient documentation

## 2021-03-25 DIAGNOSIS — I10 Essential (primary) hypertension: Secondary | ICD-10-CM | POA: Insufficient documentation

## 2021-03-25 DIAGNOSIS — E039 Hypothyroidism, unspecified: Secondary | ICD-10-CM | POA: Diagnosis not present

## 2021-03-25 DIAGNOSIS — Z85850 Personal history of malignant neoplasm of thyroid: Secondary | ICD-10-CM | POA: Insufficient documentation

## 2021-03-25 DIAGNOSIS — Z79899 Other long term (current) drug therapy: Secondary | ICD-10-CM | POA: Insufficient documentation

## 2021-03-25 DIAGNOSIS — R21 Rash and other nonspecific skin eruption: Secondary | ICD-10-CM | POA: Diagnosis present

## 2021-03-25 MED ORDER — TORSEMIDE 40 MG PO TABS: 1.0000 | ORAL_TABLET | Freq: Two times a day (BID) | ORAL | 0 refills | Status: DC

## 2021-03-25 NOTE — ED Provider Notes (Signed)
[facility] EMERGENCY DEPARTMENT Provider Note   CSN: [id number] Arrival date & time: 03/25/21  0016     History Chief Complaint  Patient presents with   Rashes / Lip Swelling    [name] is a 60 y.o. female.  HPI Patient is a 60 year old female with past medical history significant for lower extremity edema, TIAs, heart palpitations, asthma, thyroid cancer, DM2, high cholesterol  Patient presents emergency room today with complaints of itching to bilateral hands and feet and bilateral legs she states that this started approximately 5 minutes after she took the first tablet of a new bottle of Lasix.  She states that she was told at the pharmacy when she was picking this up that there was a new provider/company that [facility] is applying for him.  Was told that there may be some sulfa compound in this.  She has a known sulfa allergy she states that she was told by her mother after she had a reaction to a sulfa continue medication as a child.  She states that her bottom lip swelled and she had a rash.  She states that her symptoms are now resolved.  She denies any difficulty breathing or lower extremity swelling chest pain shortness of breath lightheadedness or dizziness.    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 "   Asthma    due to allergies    Cancer ([place])    THRYOID 2016 [place], KIDNEY CANCER MAY 2018  RIGHT RENAL CRYOABLATION   Complication of anesthesia    Diabetes mellitus    TYPE 2   Dyspnea    increased exertion    Dysrhythmia    " irregular heart rate per patient"   Family history of adverse reaction to anesthesia    difficult for father to wake after surgery, AFTHER LOW BP WITH ANESTHESIA   Heart murmur    HOH (hard of hearing)    Hypercholesteremia    Hypertension    PONV (postoperative nausea and vomiting)    Renal cancer, right ([place]) dx'd 07/2018, 10/2018   ablation x 2     Thyroid disease    TIA (transient ischemic attack)    LAST TIA  APRIL 2020 ON PLAVIX, reports residual effects ,02/2020    Patient Active Problem List   Diagnosis Date Noted   Left-sided weakness    TIA (transient ischemic attack) 03/09/2020   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[place]) 11/15/2018   Papillary adenocarcinoma, renal, 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   UTI (urinary tract infection) 06/26/2017   Urinary tract infection 01/24/2017   Abdominal pain 01/21/2017   Stroke-like symptoms 05/16/2016   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 05/16/2016   Fatty liver disease, nonalcoholic 17/51/0258   Slurred speech 09/13/2015   Parotitis 09/13/2015   Gastroenteritis 08/21/2015   Hypothyroid 08/21/2015   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   Pain in the chest    Essential hypertension    Thoracic aortic aneurysm    Chest pain 04/23/2014   Anginal pain ([place]) 04/23/2014   Type 2 diabetes mellitus ([place])    Hypertension    Hypercholesteremia     Past Surgical History:  Procedure Laterality Date   ABDOMINAL HYSTERECTOMY     COMPLETE   BIOPSY  03/02/2019   Procedure: BIOPSY;  Surgeon: Carol Ada, MD;  Location: [facility];  Service: Endoscopy;;   CHOLECYSTECTOMY     COLON SURGERY     COLONOSCOPY WITH PROPOFOL N/A 03/02/2019   Procedure: COLONOSCOPY WITH PROPOFOL;  Surgeon: Carol Ada, MD;  Location: [facility];  Service: Endoscopy;  Laterality: N/A;   ear surgeries      implantable loop recorder placement  06/02/2020   Medtronic Reveal Linq model LNQ22 implantable loop recorder (SN [id number])    IR RADIOLOGIST EVAL & MGMT  01/12/2017   IR RADIOLOGIST EVAL & MGMT  06/16/2017   IR RADIOLOGIST EVAL & MGMT  08/31/2017   IR RADIOLOGIST EVAL & MGMT  12/13/2017   IR RADIOLOGIST EVAL & MGMT  03/16/2018   IR RADIOLOGIST EVAL & MGMT  11/01/2018   IR RADIOLOGIST EVAL & MGMT  12/13/2018   IR RADIOLOGIST EVAL & MGMT  02/15/2019    IR RADIOLOGIST EVAL & MGMT  08/16/2019   IR RADIOLOGIST EVAL & MGMT  11/22/2019   IR RADIOLOGIST EVAL & MGMT  06/05/2020   IR RADIOLOGIST EVAL & MGMT  11/27/2020   KNEE ARTHROSCOPY     LEFT HEART CATHETERIZATION WITH CORONARY ANGIOGRAM N/A 04/24/2014   Procedure: LEFT HEART CATHETERIZATION WITH CORONARY ANGIOGRAM;  Surgeon: Burnell Blanks, MD;  Location: [facility] CATH LAB;  Service: Cardiovascular;  Laterality: N/A;   POLYPECTOMY  03/02/2019   Procedure: POLYPECTOMY;  Surgeon: Carol Ada, MD;  Location: [facility];  Service: Endoscopy;;   RADIOFREQUENCY ABLATION Right 08/05/2017   Procedure: CT RENAL CRYO AND BIOPSY;  Surgeon: Sandi Mariscal, MD;  Location: [facility] OR[facility];  Service: Anesthesiology;  Laterality: Right;   RADIOFREQUENCY ABLATION Right 11/15/2018   Procedure: RIGHT RENAL CRYOABLATION;  Surgeon: Sandi Mariscal, MD;  Location: [facility];  Service: Anesthesiology;  Laterality: Right;   THYROIDECTOMY N/A 09/20/2014   Procedure: TOTAL THYROIDECTOMY;  Surgeon: Armandina Gemma, MD;  Location: [facility];  Service: General;  Laterality: N/A;   TONSILLECTOMY       OB History   No obstetric history on file.     Family History  Problem Relation Age of Onset   Heart failure Mother    Diabetes Mother    Stroke Mother    Stroke Father    Heart failure Father    Cancer Father     Social History   Tobacco Use   Smoking status: Never   Smokeless tobacco: Never  Vaping Use   Vaping Use: Never used  Substance Use Topics   Alcohol use: No   Drug use: No    Home Medications Prior to Admission medications   Medication Sig Start Date End Date Taking? Authorizing Provider  Torsemide 40 MG TABS Take 1 tablet by mouth in the morning and at bedtime for 10 days. 03/25/21 04/04/21 Yes Stevie Ertle, Ova [place] S, PA  acetaminophen (TYLENOL) 500 MG tablet Take 2 tablets (1,000 mg total) by mouth every 6 (six) hours as needed (body aches). Patient taking differently: Take 1,000 mg by mouth every 8 (eight) hours as needed  for mild pain. Alternates w Ibuprofen 06/29/17   Eulogio Bear U, DO  albuterol (PROVENTIL HFA;VENTOLIN HFA) 108 (90 Base) MCG/ACT inhaler Inhale 1-2 puffs into the lungs every 6 (six) hours as needed for wheezing or shortness of breath. 12/28/17   Jean Rosenthal, MD  albuterol (VENTOLIN HFA) 108 (90 Base) MCG/ACT inhaler Inhale 2 puffs every 4 to 6 hours as needed. 12/22/20   Lin Landsman, MD  atorvastatin (LIPITOR) 40 MG tablet Take 1 tablet (40 mg total) by mouth at bedtime. 11/14/20 12/14/20  benzonatate (TESSALON) 200 MG capsule Take 1 capsule by mouth 3 times a day 12/22/20     CALCIUM-MAGNESIUM-ZINC PO Take 2 tablets by mouth 2 (two) times daily.    [provider]  clopidogrel (PLAVIX) 75 MG tablet Take 1 tablet (75 mg total) by mouth daily. 12/04/20   Frann Rider, NP  diclofenac Sodium (VOLTAREN) 1 % GEL Apply to affected area(s) twice daily as directed 10/16/20   Delene Ruffini, MD  diphenhydrAMINE (BENADRYL) 25 MG tablet Take 25 mg by mouth at bedtime.    [provider]  fluticasone (FLONASE) 50 MCG/ACT nasal spray Place 2 sprays into both nostrils daily. 03/04/20   [provider]  furosemide (LASIX) 40 MG tablet Take 2 tablets (80 mg total) by mouth 2 (two) times daily. 05/17/16   Geradine Girt, DO  ibuprofen (ADVIL) 200 MG tablet Take 800 mg by mouth every 8 (eight) hours as needed for headache or moderate pain.    [provider]  levothyroxine (SYNTHROID) 200 MCG tablet Take 1 tablet by mouth daily. 12/08/20     levothyroxine (SYNTHROID) 300 MCG tablet TAKE 1 TABLET BY MOUTH DAILY FOR 6 DAYS, THEN 1/2 TABLET DAILY ON 7TH DAY AS DIRECTED 12/08/20     lisinopril (PRINIVIL,ZESTRIL) 10 MG tablet Take 10 mg by mouth daily.  09/09/14   [provider]  lisinopril (ZESTRIL) 10 MG tablet Take 1 tablet by mouth once a day. 12/15/20     metFORMIN (GLUCOPHAGE) 500 MG tablet Take 1 tablet (500 mg total) by mouth 2 (two) times daily with a meal. 10/16/20  10/16/21  Delene Ruffini, MD  metoprolol (LOPRESSOR) 100 MG tablet Take 100 mg by mouth 2 (two) times daily. 06/23/16 06/23/17  [provider]  metoprolol tartrate (LOPRESSOR) 100 MG tablet Take 100 mg by mouth 2 (two) times daily. 06/23/16   [provider]  metoprolol tartrate (LOPRESSOR) 100 MG tablet Take one tablet by mouth twice daily. 10/27/20   Alphia Moh, MD  molnupiravir EUA (LAGEVRIO) 200 MG CAPS capsule Take 4 capsules by mouth 2 times a day 12/22/20     mupirocin ointment (BACTROBAN) 2 % Apply 1 application topically in the morning and at bedtime. Mole removal site on back 09/16/20   [provider]  nitrofurantoin, macrocrystal-monohydrate, (MACROBID) 100 MG capsule Take 1 capsule (100 mg total) by mouth 2 (two) times daily. 11/13/20   Teodora Medici, FNP  ondansetron (ZOFRAN ODT) 4 MG disintegrating tablet Take 1 tablet (4 mg total) by mouth every 8 (eight) hours as needed for nausea or vomiting. 10/09/20   Isla Pence, MD  pantoprazole (PROTONIX) 40 MG tablet Take 1 tablet (40 mg total) by mouth daily. 12/04/20   Frann Rider, NP  potassium chloride SA (K-DUR,KLOR-CON) 20 MEQ tablet Take 40 mEq by mouth 2 (two) times daily.    [provider]  potassium chloride SA (KLOR-CON M) 20 MEQ tablet TAKE 2 TABLETS BY MOUTH TWICE DAILY WITH FOOD 03/03/21     potassium chloride SA (KLOR-CON M) 20 MEQ tablet TAKE 2 TABLETS BY MOUTH TWICE DAILY WITH FOOD 02/16/21     Probiotic Product (PROBIOTIC PO) Take 1 tablet by mouth daily.    [provider]  Semaglutide,0.25 or 0.5MG /DOS, (OZEMPIC, 0.25 OR 0.5 MG/DOSE,) 2 MG/1.5ML SOPN Inject 0.5 mg into the skin once a week. 01/23/21     zinc gluconate 50 MG tablet Take 50 mg by mouth daily as needed (flu season).    [provider]  Allergies    Lorabid [loracarbef], Amoxicillin, Augmentin [amoxicillin-pot clavulanate], Codeine, Gadolinium derivatives, Sulfa drugs cross reactors, Benicar  [olmesartan medoxomil], and Other  Review of Systems   Review of Systems  Constitutional:  Negative for chills and fever.  HENT:  Negative for congestion.   Eyes:  Negative for pain.  Respiratory:  Negative for cough and shortness of breath.   Cardiovascular:  Negative for chest pain and leg swelling.  Gastrointestinal:  Negative for abdominal pain, diarrhea, nausea and vomiting.  Genitourinary:  Negative for dysuria.  Musculoskeletal:  Negative for myalgias.  Skin:  Positive for rash.  Neurological:  Negative for dizziness and headaches.   Physical Exam Updated Vital Signs BP (!) 116/95 (BP Location: Left Arm)    Pulse (!) 106    Temp 97.8 F (36.6 C) (Oral)    Resp 18    Ht 5\' 6"  (1.676 m)    Wt 105 kg    SpO2 100%    BMI 37.36 kg/m   Physical Exam Vitals and nursing note reviewed.  Constitutional:      General: She is not in acute distress. HENT:     Head: Normocephalic and atraumatic.     Nose: Nose normal.     Mouth/Throat:     Mouth: Mucous membranes are moist.  Eyes:     General: No scleral icterus. Cardiovascular:     Rate and Rhythm: Normal rate and regular rhythm.     Pulses: Normal pulses.     Heart sounds: Normal heart sounds.  Pulmonary:     Effort: Pulmonary effort is normal. No respiratory distress.     Breath sounds: No wheezing.     Comments: Lungs are clear no wheezing Abdominal:     Palpations: Abdomen is soft.     Tenderness: There is no abdominal tenderness. There is no guarding or rebound.  Musculoskeletal:     Cervical back: Normal range of motion.     Right lower leg: No edema.     Left lower leg: No edema.  Skin:    General: Skin is warm and dry.     Capillary Refill: Capillary refill takes less than 2 seconds.     Comments: No rashes to any extremities  Neurological:     Mental Status: She is alert. Mental status is at baseline.  Psychiatric:        Mood and Affect: Mood normal.        Behavior: Behavior normal.    ED Results /  Procedures / Treatments   Labs (all labs ordered are listed, but only abnormal results are displayed) Labs Reviewed - No data to display  EKG None  Radiology No results found.  Procedures Procedures   Medications Ordered in ED Medications - No data to display  ED Course  I have reviewed the triage vital signs and the nursing notes.  Pertinent labs & imaging results that were available during my care of the patient were reviewed by me and considered in my medical decision making (see chart for details).    MDM Rules/Calculators/A&P                          Pt concerned about allergic reaction. Has in the ER for 15 hours at this time.   She took Benadryl at home has no rash currently is breathing clearly lungs without wheezing no crackles or lower extremity edema or JVP to indicate fluid overload.  Discussed with pharmacy  who assisted with dosing for torsemide.  We will transition patient to 40 mg of torsemide twice daily she is currently on 80 mg of Lasix twice daily.  Discussed w my attending physician.   Patient is agreeable to plan and has a primary care appointment next week and we will further discuss diuretic options.  Final Clinical Impression(s) / ED Diagnoses Final diagnoses:  Allergic reaction, initial encounter    Rx / DC Orders ED Discharge Orders          Ordered    Torsemide 40 MG TABS  2 times daily        03/25/21 1519             Tedd Sias, [place] 25/83/46 2194    Lianne Cure, DO 71/25/27 2124

## 2021-03-25 NOTE — ED Provider Notes (Signed)
Emergency Medicine Provider Triage Evaluation Note  [name] , a 60 y.o. female  was evaluated in triage.  Pt complains of rash and lip swelling.  Takes lasix chronically and was told on her recent refill that they switched to new manufacturer and now her pills contained a small amount of sulfa  which she is allergic to.  She has taken benadryl which has helped.  Review of Systems  Positive: rash Negative: fever  Physical Exam  BP (!) 142/105 (BP Location: Right Arm)    Pulse 88    Temp 97.6 F (36.4 C) (Oral)    Resp 15    Ht 5\' 6"  (1.676 m)    Wt 105 kg    SpO2 100%    BMI 37.36 kg/m  Gen:   Awake, no distress   Resp:  Normal effort  MSK:   Moves extremities without difficulty  Other:  No noted lip/tongue swelling, itching hands and forearms without discrete rash noted  Medical Decision Making  Medically screening exam initiated at 1:45 AM.  Appropriate orders placed.  JASHIRA COTUGNO was informed that the remainder of the evaluation will be completed by another provider, this initial triage assessment does not replace that evaluation, and the importance of remaining in the ED until their evaluation is complete.  Itching.  ? From small amounts of sulfa per her report.  Improved with benadryl.  No airway compromise at present.     Larene Pickett, PA-C 53/74/82 [id number]    Delora Fuel, MD 67/54/49 [phone]

## 2021-03-25 NOTE — ED Triage Notes (Signed)
Patient reports itchy skin at hands , forearms neck with lower lip swelling onset this morning , airway intact /respirations unlabored , she took 2 Benadryl tabs prior to arrival .

## 2021-03-25 NOTE — Discharge Instructions (Signed)
I have written a prescription for torsemide which is similar to the Lasix you are prescribed.  Please follow-up with your primary care team at your scheduled appointment next week.  You may always return to the emergency room should experience any new or concerning symptoms.

## 2021-03-26 ENCOUNTER — Other Ambulatory Visit (HOSPITAL_COMMUNITY): Payer: Self-pay

## 2021-03-26 MED ORDER — ATORVASTATIN CALCIUM 40 MG PO TABS
40.0000 mg | ORAL_TABLET | Freq: Every day | ORAL | 3 refills | Status: DC
  Filled 2021-03-26: qty 30, 30d supply, fill #0

## 2021-03-27 ENCOUNTER — Other Ambulatory Visit (HOSPITAL_COMMUNITY): Payer: Self-pay

## 2021-03-31 ENCOUNTER — Other Ambulatory Visit (HOSPITAL_COMMUNITY): Payer: Self-pay

## 2021-04-01 ENCOUNTER — Other Ambulatory Visit (HOSPITAL_COMMUNITY): Payer: Self-pay

## 2021-04-01 MED ORDER — SPIRONOLACTONE 25 MG PO TABS
ORAL_TABLET | ORAL | 1 refills | Status: DC
  Filled 2021-04-01: qty 60, 30d supply, fill #0
  Filled 2021-04-28: qty 60, 30d supply, fill #1

## 2021-04-05 ENCOUNTER — Emergency Department (HOSPITAL_BASED_OUTPATIENT_CLINIC_OR_DEPARTMENT_OTHER): Payer: Medicare PPO

## 2021-04-05 ENCOUNTER — Other Ambulatory Visit: Payer: Self-pay

## 2021-04-05 ENCOUNTER — Emergency Department (HOSPITAL_BASED_OUTPATIENT_CLINIC_OR_DEPARTMENT_OTHER): Payer: Medicare PPO | Admitting: Radiology

## 2021-04-05 ENCOUNTER — Ambulatory Visit
Admission: EM | Admit: 2021-04-05 | Discharge: 2021-04-05 | Disposition: A | Discharge status: Home / Self Care | Payer: Medicare Other

## 2021-04-05 ENCOUNTER — Encounter (HOSPITAL_BASED_OUTPATIENT_CLINIC_OR_DEPARTMENT_OTHER): Payer: Self-pay | Admitting: Emergency Medicine

## 2021-04-05 ENCOUNTER — Encounter: Payer: Self-pay | Admitting: Emergency Medicine

## 2021-04-05 ENCOUNTER — Emergency Department (HOSPITAL_BASED_OUTPATIENT_CLINIC_OR_DEPARTMENT_OTHER)
Admission: EM | Admit: 2021-04-05 | Discharge: 2021-04-05 | Disposition: A | Discharge status: Home / Self Care | Payer: Medicare PPO | Attending: Emergency Medicine | Admitting: Emergency Medicine

## 2021-04-05 DIAGNOSIS — I509 Heart failure, unspecified: Secondary | ICD-10-CM

## 2021-04-05 DIAGNOSIS — Z7902 Long term (current) use of antithrombotics/antiplatelets: Secondary | ICD-10-CM | POA: Insufficient documentation

## 2021-04-05 DIAGNOSIS — Z7984 Long term (current) use of oral hypoglycemic drugs: Secondary | ICD-10-CM | POA: Diagnosis not present

## 2021-04-05 DIAGNOSIS — R0789 Other chest pain: Secondary | ICD-10-CM | POA: Diagnosis present

## 2021-04-05 DIAGNOSIS — S9032XA Contusion of left foot, initial encounter: Secondary | ICD-10-CM | POA: Insufficient documentation

## 2021-04-05 DIAGNOSIS — X58XXXA Exposure to other specified factors, initial encounter: Secondary | ICD-10-CM | POA: Insufficient documentation

## 2021-04-05 DIAGNOSIS — Z79899 Other long term (current) drug therapy: Secondary | ICD-10-CM | POA: Diagnosis not present

## 2021-04-05 LAB — CBC
HCT: 37.8 % (ref 36.0–46.0)
Hemoglobin: 12.1 g/dL (ref 12.0–15.0)
MCH: 28.9 pg (ref 26.0–34.0)
MCHC: 32 g/dL (ref 30.0–36.0)
MCV: 90.4 fL (ref 80.0–100.0)
Platelets: 297 10*3/uL (ref 150–400)
RBC: 4.18 MIL/uL (ref 3.87–5.11)
RDW: 13.2 % (ref 11.5–15.5)
WBC: 6.5 10*3/uL (ref 4.0–10.5)
nRBC: 0 % (ref 0.0–0.2)

## 2021-04-05 LAB — BASIC METABOLIC PANEL
Anion gap: 9 (ref 5–15)
BUN: 10 mg/dL (ref 6–20)
CO2: 21 mmol/L — ABNORMAL LOW (ref 22–32)
Calcium: 8.6 mg/dL — ABNORMAL LOW (ref 8.9–10.3)
Chloride: 109 mmol/L (ref 98–111)
Creatinine, Ser: 1.02 mg/dL — ABNORMAL HIGH (ref 0.44–1.00)
GFR, Estimated: >60 mL/min (ref >60)
Glucose, Bld: 147 mg/dL — ABNORMAL HIGH (ref 70–99)
Potassium: 4.6 mmol/L (ref 3.5–5.1)
Sodium: 139 mmol/L (ref 135–145)

## 2021-04-05 LAB — BRAIN NATRIURETIC PEPTIDE: B Natriuretic Peptide: 256.6 pg/mL — ABNORMAL HIGH (ref 0.0–100.0)

## 2021-04-05 LAB — TROPONIN I (HIGH SENSITIVITY)
Troponin I (High Sensitivity): 2 ng/L (ref <18)
Troponin I (High Sensitivity): 2 ng/L (ref <18)

## 2021-04-05 MED ORDER — DIPHENHYDRAMINE HCL 50 MG/ML IJ SOLN
25.0000 mg | Freq: Once | INTRAMUSCULAR | Status: AC
Start: 2021-04-05 — End: 2021-04-05
  Administered 2021-04-05: 25 mg via INTRAVENOUS
  Filled 2021-04-05: qty 1

## 2021-04-05 MED ORDER — HYDRALAZINE HCL 20 MG/ML IJ SOLN
20.0000 mg | Freq: Once | INTRAMUSCULAR | Status: AC
Start: 2021-04-05 — End: 2021-04-05
  Administered 2021-04-05: 20 mg via INTRAVENOUS
  Filled 2021-04-05: qty 1

## 2021-04-05 NOTE — ED Notes (Signed)
Pt was advised by provider she needs to go to the hospital for further evaluation and treatments

## 2021-04-05 NOTE — Discharge Instructions (Addendum)
You have been given medication to aid with your breathing here in the ED.  It should help lowering blood pressure. Please continue your fluid medications at home Please keep your left foot elevated with Ace wrap in place.  Ace wrap should help with decreasing any swelling to the foot.  It should not cause any increased pain. Please call your doctor for recheck tomorrow Return if you are having any worsening symptoms.

## 2021-04-05 NOTE — ED Triage Notes (Signed)
Pt is having fluid overload in her feet and having bruising on her left foot from the medication. Pt said she is having issues with her walking bc of her feet are swollen and hurting.Possible fluid overload bc of not having her medications x 7 days.

## 2021-04-05 NOTE — ED Provider Notes (Signed)
[facility] EMERGENCY DEPT Provider Note   CSN: [id number] Arrival date & time: 04/05/21  1334     History  Chief Complaint  Patient presents with   Chest Pain    [name] is a 61 y.o. female.  HPI     Home Medications Prior to Admission medications   Medication Sig Start Date End Date Taking? Authorizing Provider  acetaminophen (TYLENOL) 500 MG tablet Take 2 tablets (1,000 mg total) by mouth every 6 (six) hours as needed (body aches). Patient taking differently: Take 1,000 mg by mouth every 8 (eight) hours as needed for mild pain. Alternates w Ibuprofen 06/29/17   Eulogio Bear U, DO  albuterol (PROVENTIL HFA;VENTOLIN HFA) 108 (90 Base) MCG/ACT inhaler Inhale 1-2 puffs into the lungs every 6 (six) hours as needed for wheezing or shortness of breath. 12/28/17   Jean Rosenthal, MD  albuterol (VENTOLIN HFA) 108 (90 Base) MCG/ACT inhaler Inhale 2 puffs every 4 to 6 hours as needed. 12/22/20   Lin Landsman, MD  atorvastatin (LIPITOR) 40 MG tablet Take 1 tablet (40 mg total) by mouth at bedtime. 11/14/20 12/14/20    atorvastatin (LIPITOR) 40 MG tablet Take 1 tablet (40 mg total) by mouth at bedtime. 03/26/21     atorvastatin (LIPITOR) 40 MG tablet Take 1 tablet (40 mg total) by mouth at bedtime. 03/26/21     benzonatate (TESSALON) 200 MG capsule Take 1 capsule by mouth 3 times a day 12/22/20     CALCIUM-MAGNESIUM-ZINC PO Take 2 tablets by mouth 2 (two) times daily.    [provider]  clopidogrel (PLAVIX) 75 MG tablet Take 1 tablet (75 mg total) by mouth daily. 12/04/20   Frann Rider, NP  diclofenac Sodium (VOLTAREN) 1 % GEL Apply to affected area(s) twice daily as directed 10/16/20   Delene Ruffini, MD  diphenhydrAMINE (BENADRYL) 25 MG tablet Take 25 mg by mouth at bedtime.    [provider]  fluticasone (FLONASE) 50 MCG/ACT nasal spray Place 2 sprays into both nostrils daily. 03/04/20   [provider]  furosemide (LASIX) 40 MG tablet Take 2  tablets (80 mg total) by mouth 2 (two) times daily. 05/17/16   Geradine Girt, DO  ibuprofen (ADVIL) 200 MG tablet Take 800 mg by mouth every 8 (eight) hours as needed for headache or moderate pain.    [provider]  levothyroxine (SYNTHROID) 200 MCG tablet Take 1 tablet by mouth daily. 12/08/20     levothyroxine (SYNTHROID) 300 MCG tablet TAKE 1 TABLET BY MOUTH DAILY FOR 6 DAYS, THEN 1/2 TABLET DAILY ON 7TH DAY AS DIRECTED 12/08/20     lisinopril (PRINIVIL,ZESTRIL) 10 MG tablet Take 10 mg by mouth daily.  09/09/14   [provider]  lisinopril (ZESTRIL) 10 MG tablet Take 1 tablet by mouth once a day. 12/15/20     metFORMIN (GLUCOPHAGE) 500 MG tablet Take 1 tablet (500 mg total) by mouth 2 (two) times daily with a meal. 10/16/20 10/16/21  Delene Ruffini, MD  metoprolol (LOPRESSOR) 100 MG tablet Take 100 mg by mouth 2 (two) times daily. 06/23/16 06/23/17  [provider]  metoprolol tartrate (LOPRESSOR) 100 MG tablet Take 100 mg by mouth 2 (two) times daily. 06/23/16   [provider]  metoprolol tartrate (LOPRESSOR) 100 MG tablet Take one tablet by mouth twice daily. 10/27/20   Alphia Moh, MD  molnupiravir EUA (LAGEVRIO) 200 MG CAPS capsule Take 4 capsules by mouth 2 times a day 12/22/20  mupirocin ointment (BACTROBAN) 2 % Apply 1 application topically in the morning and at bedtime. Mole removal site on back 09/16/20   [provider]  nitrofurantoin, macrocrystal-monohydrate, (MACROBID) 100 MG capsule Take 1 capsule (100 mg total) by mouth 2 (two) times daily. 11/13/20   Teodora Medici, FNP  ondansetron (ZOFRAN ODT) 4 MG disintegrating tablet Take 1 tablet (4 mg total) by mouth every 8 (eight) hours as needed for nausea or vomiting. 10/09/20   Isla Pence, MD  pantoprazole (PROTONIX) 40 MG tablet Take 1 tablet (40 mg total) by mouth daily. 12/04/20   Frann Rider, NP  potassium chloride SA (K-DUR,KLOR-CON) 20 MEQ tablet Take 40 mEq by mouth 2 (two)  times daily.    [provider]  potassium chloride SA (KLOR-CON M) 20 MEQ tablet TAKE 2 TABLETS BY MOUTH TWICE DAILY WITH FOOD 03/03/21     potassium chloride SA (KLOR-CON M) 20 MEQ tablet TAKE 2 TABLETS BY MOUTH TWICE DAILY WITH FOOD 02/16/21     Probiotic Product (PROBIOTIC PO) Take 1 tablet by mouth daily.    [provider]  Semaglutide,0.25 or 0.5MG /DOS, (OZEMPIC, 0.25 OR 0.5 MG/DOSE,) 2 MG/1.5ML SOPN Inject 0.5 mg into the skin once a week. 01/23/21     spironolactone (ALDACTONE) 25 MG tablet Take 1 tablet by mouth 2 times a day 03/31/21     Torsemide 40 MG TABS Take 1 tablet by mouth in the morning and at bedtime for 10 days. 03/25/21 04/04/21  Tedd Sias, PA  zinc gluconate 50 MG tablet Take 50 mg by mouth daily as needed (flu season).    [provider]      Allergies    Amoxicillin, Lorabid [loracarbef], Augmentin [amoxicillin-pot clavulanate], Codeine, Furosemide, Gadolinium derivatives, Sulfa drugs cross reactors, Benicar [olmesartan medoxomil], and Other    Review of Systems   Review of Systems  Physical Exam Updated Vital Signs BP (!) 148/84    Pulse 71    Temp 98.1 F (36.7 C)    Resp 16    Ht 1.676 m (5\' 6" )    Wt 105 kg    SpO2 100%    BMI 37.36 kg/m  Physical Exam  ED Results / Procedures / Treatments   Labs (all labs ordered are listed, but only abnormal results are displayed) Labs Reviewed  BASIC METABOLIC PANEL - Abnormal; Notable for the following components:      Result Value   CO2 21 (*)    Glucose, Bld 147 (*)    Creatinine, Ser 1.02 (*)    Calcium 8.6 (*)    All other components within normal limits  BRAIN NATRIURETIC PEPTIDE - Abnormal; Notable for the following components:   B Natriuretic Peptide 256.6 (*)    All other components within normal limits  CBC  TROPONIN I (HIGH SENSITIVITY)  TROPONIN I (HIGH SENSITIVITY)    EKG EKG Interpretation  Date/Time:  Sunday April 05 2021 13:47:35 EST Ventricular Rate:  75 PR  Interval:  208 QRS Duration: 84 QT Interval:  350 QTC Calculation: 390 R Axis:   -65 Text Interpretation: Normal sinus rhythm Left axis deviation Low voltage QRS Septal infarct , age undetermined Abnormal ECG When compared with ECG of 15-Oct-2020 10:58, No significant change since last tracing Confirmed by Pattricia Boss [phone]) on 04/05/2021 7:21:00 PM  Radiology DG Chest Port 1 View  Result Date: 04/05/2021 CLINICAL DATA:  Shortness of breath.  Fluid overload in the feet. EXAM: PORTABLE CHEST 1 VIEW COMPARISON:  None.  FINDINGS: The heart size and mediastinal contours are within normal limits. Both lungs are clear. No evidence of pulmonary edema. No pleural effusion. The visualized skeletal structures are unremarkable. IMPRESSION: No active disease. Electronically Signed   By: Keane Police D.O.   On: 04/05/2021 15:18   DG Foot Complete Left  Result Date: 04/05/2021 CLINICAL DATA:  Left foot pain. EXAM: LEFT FOOT - COMPLETE 3+ VIEW COMPARISON:  Left ankle radiograph dated 03/03/2010. FINDINGS: There is no acute fracture or dislocation. There is degenerative changes of the ankle and tarsal joints. There is soft tissue swelling of the forefoot. No radiopaque foreign object or soft tissue gas. IMPRESSION: 1. No acute fracture or dislocation. 2. Soft tissue swelling of the forefoot. Electronically Signed   By: Anner Crete M.D.   On: 04/05/2021 19:19    Procedures Procedures    Medications Ordered in ED Medications  diphenhydrAMINE (BENADRYL) injection 25 mg (has no administration in time range)  hydrALAZINE (APRESOLINE) injection 20 mg (20 mg Intravenous Given 04/05/21 1800)    ED Course/ Medical Decision Making/ A&P Clinical Course as of 04/05/21 1924  Nancy Fetter Apr 05, 2021  1917 Foot x-Rosette Bellavance personally viewed interpreted no evidence of acute fracture noted on my interpretation Radiology interpretation pending [DR]    Clinical Course User Index [DR] Pattricia Boss, MD                            Medical Decision Making Patient complains of chest tightness and feels like she has some increased fluid.  She also has noted a bruise on her left foot with no trauma.  It is painful.  She is on antiplatelets. EKG obtained with no evidence of acute abnormality. Troponin and repeat troponin reviewed without change. Reviewed medications as patient reports allergies to sulfa medications reports that when she received Lasix and Bumex that she had allergic reactions. Discussed medications with pharmacist Bertis Ruddy at [facility] appreciate medication recommendations Patient given hydralazine here.  Blood pressure has decreased.  However, she complained of redness and some dyspnea after this was given.  Her face is red.  There is no wheezing and otherwise vital signs remained normal.  She is given Benadryl. Doubt acute coronary syndrome with no acute changes in EKG and troponins normal Patient may have some mild volume overload she has had have multiple changes in her diuretics recently.  She has a mildly elevated BNP.  Chest x-Blondell Laperle does not show overt failure.  Patient is advised regarding taking her ongoing spironolactone at home. Doubt PE with no risk factors for DVT, no tachycardia and normal oxygen saturations Discussed return precautions and need for close follow-up patient voices understanding.  Amount and/or Complexity of Data Reviewed External Data Reviewed: notes. Labs: ordered. Decision-making details documented in ED Course. Radiology: ordered and independent interpretation performed. ECG/medicine tests: ordered and independent interpretation performed. Decision-making details documented in ED Course.  Risk Prescription drug management. Decision regarding hospitalization.           Final Clinical Impression(s) / ED Diagnoses Final diagnoses:  Contusion of left foot, initial encounter  Acute on chronic congestive heart failure, unspecified heart failure type [facility])    Rx  / DC Orders ED Discharge Orders          Ordered    Apply wrap        04/05/21 1749              Pattricia Boss, MD  04/05/21 1925 ° °

## 2021-04-05 NOTE — ED Provider Notes (Signed)
[facility] EMERGENCY DEPT Provider Note   CSN: [id number] Arrival date & time: 04/05/21  1334     History  Chief Complaint  Patient presents with   Chest Pain    [name] is a 61 y.o. female.  HPI 61 yo female ho hypertension, co fluid retention chest tightness and heaviness began about two days ago.  Describes as heavy and pressure and tightness began 2 days ago.  Reports having it constantly, no increasing or decreasing factors.  Patient states hse had it before on the 4th and it got better with the fluid medication.  PMD Dr.Reese- saw on the third and started a new diuretic 25 mg because she was thought having an allergic reaction to her furosemide.  She was told the new formulation of generic lasix had sulfa and had rash and throat swelling.  Lasix stgopped and went to ed at [facility].  Prescribed a new diuretic an dwas told that also had diuretic.  Started new medicine from Dr. Ayesha Rumpf and feels that she has not released enough fluid.       Home Medications Prior to Admission medications   Medication Sig Start Date End Date Taking? Authorizing Provider  acetaminophen (TYLENOL) 500 MG tablet Take 2 tablets (1,000 mg total) by mouth every 6 (six) hours as needed (body aches). Patient taking differently: Take 1,000 mg by mouth every 8 (eight) hours as needed for mild pain. Alternates w Ibuprofen 06/29/17   Eulogio Bear U, DO  albuterol (PROVENTIL HFA;VENTOLIN HFA) 108 (90 Base) MCG/ACT inhaler Inhale 1-2 puffs into the lungs every 6 (six) hours as needed for wheezing or shortness of breath. 12/28/17   Jean Rosenthal, MD  albuterol (VENTOLIN HFA) 108 (90 Base) MCG/ACT inhaler Inhale 2 puffs every 4 to 6 hours as needed. 12/22/20   Lin Landsman, MD  atorvastatin (LIPITOR) 40 MG tablet Take 1 tablet (40 mg total) by mouth at bedtime. 11/14/20 12/14/20    atorvastatin (LIPITOR) 40 MG tablet Take 1 tablet (40 mg total) by mouth at bedtime. 03/26/21     atorvastatin (LIPITOR)  40 MG tablet Take 1 tablet (40 mg total) by mouth at bedtime. 03/26/21     benzonatate (TESSALON) 200 MG capsule Take 1 capsule by mouth 3 times a day 12/22/20     CALCIUM-MAGNESIUM-ZINC PO Take 2 tablets by mouth 2 (two) times daily.    [provider]  clopidogrel (PLAVIX) 75 MG tablet Take 1 tablet (75 mg total) by mouth daily. 12/04/20   Frann Rider, NP  diclofenac Sodium (VOLTAREN) 1 % GEL Apply to affected area(s) twice daily as directed 10/16/20   Delene Ruffini, MD  diphenhydrAMINE (BENADRYL) 25 MG tablet Take 25 mg by mouth at bedtime.    [provider]  fluticasone (FLONASE) 50 MCG/ACT nasal spray Place 2 sprays into both nostrils daily. 03/04/20   [provider]  furosemide (LASIX) 40 MG tablet Take 2 tablets (80 mg total) by mouth 2 (two) times daily. 05/17/16   Geradine Girt, DO  ibuprofen (ADVIL) 200 MG tablet Take 800 mg by mouth every 8 (eight) hours as needed for headache or moderate pain.    [provider]  levothyroxine (SYNTHROID) 200 MCG tablet Take 1 tablet by mouth daily. 12/08/20     levothyroxine (SYNTHROID) 300 MCG tablet TAKE 1 TABLET BY MOUTH DAILY FOR 6 DAYS, THEN 1/2 TABLET DAILY ON 7TH DAY AS DIRECTED 12/08/20     lisinopril (PRINIVIL,ZESTRIL) 10 MG tablet Take 10 mg  by mouth daily.  09/09/14   [provider]  lisinopril (ZESTRIL) 10 MG tablet Take 1 tablet by mouth once a day. 12/15/20     metFORMIN (GLUCOPHAGE) 500 MG tablet Take 1 tablet (500 mg total) by mouth 2 (two) times daily with a meal. 10/16/20 10/16/21  Delene Ruffini, MD  metoprolol (LOPRESSOR) 100 MG tablet Take 100 mg by mouth 2 (two) times daily. 06/23/16 06/23/17  [provider]  metoprolol tartrate (LOPRESSOR) 100 MG tablet Take 100 mg by mouth 2 (two) times daily. 06/23/16   [provider]  metoprolol tartrate (LOPRESSOR) 100 MG tablet Take one tablet by mouth twice daily. 10/27/20   Alphia Moh, MD  molnupiravir EUA (LAGEVRIO) 200  MG CAPS capsule Take 4 capsules by mouth 2 times a day 12/22/20     mupirocin ointment (BACTROBAN) 2 % Apply 1 application topically in the morning and at bedtime. Mole removal site on back 09/16/20   [provider]  nitrofurantoin, macrocrystal-monohydrate, (MACROBID) 100 MG capsule Take 1 capsule (100 mg total) by mouth 2 (two) times daily. 11/13/20   Teodora Medici, FNP  ondansetron (ZOFRAN ODT) 4 MG disintegrating tablet Take 1 tablet (4 mg total) by mouth every 8 (eight) hours as needed for nausea or vomiting. 10/09/20   Isla Pence, MD  pantoprazole (PROTONIX) 40 MG tablet Take 1 tablet (40 mg total) by mouth daily. 12/04/20   Frann Rider, NP  potassium chloride SA (K-DUR,KLOR-CON) 20 MEQ tablet Take 40 mEq by mouth 2 (two) times daily.    [provider]  potassium chloride SA (KLOR-CON M) 20 MEQ tablet TAKE 2 TABLETS BY MOUTH TWICE DAILY WITH FOOD 03/03/21     potassium chloride SA (KLOR-CON M) 20 MEQ tablet TAKE 2 TABLETS BY MOUTH TWICE DAILY WITH FOOD 02/16/21     Probiotic Product (PROBIOTIC PO) Take 1 tablet by mouth daily.    [provider]  Semaglutide,0.25 or 0.5MG /DOS, (OZEMPIC, 0.25 OR 0.5 MG/DOSE,) 2 MG/1.5ML SOPN Inject 0.5 mg into the skin once a week. 01/23/21     spironolactone (ALDACTONE) 25 MG tablet Take 1 tablet by mouth 2 times a day 03/31/21     Torsemide 40 MG TABS Take 1 tablet by mouth in the morning and at bedtime for 10 days. 03/25/21 04/04/21  Tedd Sias, PA  zinc gluconate 50 MG tablet Take 50 mg by mouth daily as needed (flu season).    [provider]      Allergies    Amoxicillin, Lorabid [loracarbef], Augmentin [amoxicillin-pot clavulanate], Codeine, Furosemide, Gadolinium derivatives, Sulfa drugs cross reactors, Benicar [olmesartan medoxomil], and Other    Review of Systems   Review of Systems  All other systems reviewed and are negative.  Physical Exam Updated Vital Signs BP (!) 160/85    Pulse 71    Temp 98.1 F  (36.7 C)    Resp 19    Ht 1.676 m (5\' 6" )    Wt 105 kg    SpO2 100%    BMI 37.36 kg/m  Physical Exam Vitals and nursing note reviewed.  HENT:     Head: Normocephalic.  Eyes:     Pupils: Pupils are equal, round, and reactive to light.  Cardiovascular:     Rate and Rhythm: Normal rate and regular rhythm.     Heart sounds: Normal heart sounds.  Pulmonary:     Effort: Pulmonary effort is normal.     Breath sounds: Normal breath sounds.  Musculoskeletal:  General: Normal range of motion.     Right lower leg: Edema present.     Left lower leg: Edema present.     Comments: Trace edema bilateral lower extremities Contusion of dorsal aspect of left foot No point tenderness noted Pulses intact sensation intact   Neurological:     Mental Status: She is alert.    ED Results / Procedures / Treatments   Labs (all labs ordered are listed, but only abnormal results are displayed) Labs Reviewed  BASIC METABOLIC PANEL - Abnormal; Notable for the following components:      Result Value   CO2 21 (*)    Glucose, Bld 147 (*)    Creatinine, Ser 1.02 (*)    Calcium 8.6 (*)    All other components within normal limits  BRAIN NATRIURETIC PEPTIDE - Abnormal; Notable for the following components:   B Natriuretic Peptide 256.6 (*)    All other components within normal limits  CBC  TROPONIN I (HIGH SENSITIVITY)  TROPONIN I (HIGH SENSITIVITY)    EKG None  Radiology DG Chest Port 1 View  Result Date: 04/05/2021 CLINICAL DATA:  Shortness of breath.  Fluid overload in the feet. EXAM: PORTABLE CHEST 1 VIEW COMPARISON:  None. FINDINGS: The heart size and mediastinal contours are within normal limits. Both lungs are clear. No evidence of pulmonary edema. No pleural effusion. The visualized skeletal structures are unremarkable. IMPRESSION: No active disease. Electronically Signed   By: Keane Police D.O.   On: 04/05/2021 15:18    Procedures Procedures    Medications Ordered in  ED Medications  hydrALAZINE (APRESOLINE) injection 20 mg (has no administration in time range)    ED Course/ Medical Decision Making/ A&P Clinical Course as of 04/05/21 1918  Nancy Fetter Apr 05, 2021  1917 Foot x-Boen Sterbenz personally viewed interpreted no evidence of acute fracture noted on my interpretation Radiology interpretation pending [DR]    Clinical Course User Index [DR] Pattricia Boss, MD                           Medical Decision Making Amount and/or Complexity of Data Reviewed Labs: ordered. Decision-making details documented in ED Course. Radiology: ordered and independent interpretation performed. Decision-making details documented in ED Course. ECG/medicine tests: ordered. Discussion of management or test interpretation with external provider(s): 61 year old female who is on antiplatelet therapy, presents today complaining of some dyspnea.  She also is complaining of a left foot contusion.  She denies any injury to the foot.  She She has been on diuretics reports that she was on Lasix and had an allergic reaction due to the sulfa component.  She states that she was on another diuretic but also continued to have a reaction.  She was seen by her primary care physician and started on new medication.  She is unable to tell me this but looking at her records it appears that it is most likely that she is on spironolactone.  She feels that this is not getting all the fluid off. Chest x-Dalvin Clipper reviewed no evidence of volume overload  BNP slightly elevated Foot without trauma but does have a contusion this is likely secondary to her antiplatelet medication. Plan Ace wrap. She appears hemodynamically stable here with unchanged EKG and normal troponins. Doubt ACS, CHF, PE or other acute causes of dyspnea including no evidence of infection. Patient advised regarding return precautions and need for follow-up. She will continue her home spironolactone.  She will call  her doctor for recheck tomorrow She  will return if she is worsening.           Final Clinical Impression(s) / ED Diagnoses Final diagnoses:  Contusion of left foot, initial encounter  Acute on chronic congestive heart failure, unspecified heart failure type [facility])    Rx / DC Orders ED Discharge Orders          Ordered    Apply wrap        04/05/21 1749              Pattricia Boss, MD 04/05/21 1750

## 2021-04-05 NOTE — ED Notes (Signed)
Pt's left foot was wrapped with ace bandage for support. Pt has positive PMS to her left foot after wrap was placed.

## 2021-04-05 NOTE — ED Triage Notes (Signed)
Pt c/o fluid build up in her feet and having tightness in her chest.

## 2021-04-06 ENCOUNTER — Other Ambulatory Visit (HOSPITAL_COMMUNITY): Payer: Self-pay

## 2021-04-09 NOTE — ED Provider Notes (Signed)
Due to concerns was advised to be seen in ED as I suspect she will need stat labs and possible imaging.    Francene Finders, PA-C 04/09/21 [phone]

## 2021-04-13 ENCOUNTER — Ambulatory Visit (INDEPENDENT_AMBULATORY_CARE_PROVIDER_SITE_OTHER): Payer: Medicare PPO

## 2021-04-13 DIAGNOSIS — G459 Transient cerebral ischemic attack, unspecified: Secondary | ICD-10-CM

## 2021-04-13 LAB — CUP PACEART REMOTE DEVICE CHECK
Date Time Interrogation Session: 20230115230458
Implantable Pulse Generator Implant Date: 20220307

## 2021-04-14 ENCOUNTER — Other Ambulatory Visit (HOSPITAL_COMMUNITY): Payer: Self-pay

## 2021-04-15 ENCOUNTER — Other Ambulatory Visit (HOSPITAL_COMMUNITY): Payer: Self-pay

## 2021-04-15 ENCOUNTER — Encounter (HOSPITAL_BASED_OUTPATIENT_CLINIC_OR_DEPARTMENT_OTHER): Payer: Self-pay | Admitting: Internal Medicine

## 2021-04-15 ENCOUNTER — Other Ambulatory Visit: Payer: Self-pay

## 2021-04-15 ENCOUNTER — Ambulatory Visit (INDEPENDENT_AMBULATORY_CARE_PROVIDER_SITE_OTHER): Payer: Medicare PPO | Admitting: Internal Medicine

## 2021-04-15 VITALS — BP 126/80 | HR 82 | Ht 66.0 in | Wt 218.0 lb

## 2021-04-15 DIAGNOSIS — I5033 Acute on chronic diastolic (congestive) heart failure: Secondary | ICD-10-CM

## 2021-04-15 DIAGNOSIS — I10 Essential (primary) hypertension: Secondary | ICD-10-CM | POA: Diagnosis not present

## 2021-04-15 DIAGNOSIS — G459 Transient cerebral ischemic attack, unspecified: Secondary | ICD-10-CM | POA: Diagnosis not present

## 2021-04-15 DIAGNOSIS — R609 Edema, unspecified: Secondary | ICD-10-CM

## 2021-04-15 MED ORDER — OZEMPIC (0.25 OR 0.5 MG/DOSE) 2 MG/1.5ML ~~LOC~~ SOPN
PEN_INJECTOR | SUBCUTANEOUS | 1 refills | Status: DC
  Filled 2021-04-15: qty 1.5, 28d supply, fill #0
  Filled 2021-05-20 (×2): qty 1.5, 28d supply, fill #1

## 2021-04-15 NOTE — Patient Instructions (Addendum)
Medication Instructions:  Your physician recommends that you continue on your current medications as directed. Please refer to the Current Medication list given to you today. *If you need a refill on your cardiac medications before your next appointment, please call your pharmacy*  Lab Work: You will get lab work today:  BMP and BNP If you have labs (blood work) drawn today and your tests are completely normal, you will receive your results only by: MyChart Message (if you have MyChart) OR A paper copy in the mail If you have any lab test that is abnormal or we need to change your treatment, we will call you to review the results.  Testing/Procedures: None ordered.  Follow-Up: At [facility], you and your health needs are our priority.  As part of our continuing mission to provide you with exceptional heart care, we have created designated Provider Care Teams.  These Care Teams include your primary Cardiologist (physician) and Advanced Practice Providers (APPs -  Physician Assistants and Nurse Practitioners) who all work together to provide you with the care you need, when you need it.  Your next appointment:   Your physician wants you to follow-up in: 6 weeks with Laurann Montana or general cardiologist.   Remote monitoring is used to monitor your loop recorder from home.   Please follow a 2 GRAM SODIUM DIET  Low-Sodium Eating Plan Sodium, which is an element that makes up salt, helps you maintain a healthy balance of fluids in your body. Too much sodium can increase your blood pressure and cause fluid and waste to be held in your body. Your health care provider or dietitian may recommend following this plan if you have high blood pressure (hypertension), kidney disease, liver disease, or heart failure. Eating less sodium can help lower your blood pressure, reduce swelling, and protect your heart, liver, and kidneys. What are tips for following this plan? Reading food labels The  Nutrition Facts label lists the amount of sodium in one serving of the food. If you eat more than one serving, you must multiply the listed amount of sodium by the number of servings. Choose foods with less than 140 mg of sodium per serving. Avoid foods with 300 mg of sodium or more per serving. Shopping  Look for lower-sodium products, often labeled as "low-sodium" or "no salt added." Always check the sodium content, even if foods are labeled as "unsalted" or "no salt added." Buy fresh foods. Avoid canned foods and pre-made or frozen meals. Avoid canned, cured, or processed meats. Buy breads that have less than 80 mg of sodium per slice. Cooking  Eat more home-cooked food and less restaurant, buffet, and fast food. Avoid adding salt when cooking. Use salt-free seasonings or herbs instead of table salt or sea salt. Check with your health care provider or pharmacist before using salt substitutes. Cook with plant-based oils, such as canola, sunflower, or olive oil. Meal planning When eating at a restaurant, ask that your food be prepared with less salt or no salt, if possible. Avoid dishes labeled as brined, pickled, cured, smoked, or made with soy sauce, miso, or teriyaki sauce. Avoid foods that contain MSG (monosodium glutamate). MSG is sometimes added to [place] food, bouillon, and some canned foods. Make meals that can be grilled, baked, poached, roasted, or steamed. These are generally made with less sodium. General information Most people on this plan should limit their sodium intake to 1,500-2,000 mg (milligrams) of sodium each day. What foods should I eat? Fruits Fresh,  frozen, or canned fruit. Fruit juice. Vegetables Fresh or frozen vegetables. "No salt added" canned vegetables. "No salt added" tomato sauce and paste. Low-sodium or reduced-sodium tomato and vegetable juice. Grains Low-sodium cereals, including oats, puffed wheat and rice, and shredded wheat. Low-sodium crackers.  Unsalted rice. Unsalted pasta. Low-sodium bread. Whole-grain breads and whole-grain pasta. Meats and other proteins Fresh or frozen (no salt added) meat, poultry, seafood, and fish. Low-sodium canned tuna and salmon. Unsalted nuts. Dried peas, beans, and lentils without added salt. Unsalted canned beans. Eggs. Unsalted nut butters. Dairy Milk. Soy milk. Cheese that is naturally low in sodium, such as ricotta cheese, fresh mozzarella, or Swiss cheese. Low-sodium or reduced-sodium cheese. Cream cheese. Yogurt. Seasonings and condiments Fresh and dried herbs and spices. Salt-free seasonings. Low-sodium mustard and ketchup. Sodium-free salad dressing. Sodium-free light mayonnaise. Fresh or refrigerated horseradish. Lemon juice. Vinegar. Other foods Homemade, reduced-sodium, or low-sodium soups. Unsalted popcorn and pretzels. Low-salt or salt-free chips. The items listed above may not be a complete list of foods and beverages you can eat. Contact a dietitian for more information. What foods should I avoid? Vegetables Sauerkraut, pickled vegetables, and relishes. Olives. [place] fries. Onion rings. Regular canned vegetables (not low-sodium or reduced-sodium). Regular canned tomato sauce and paste (not low-sodium or reduced-sodium). Regular tomato and vegetable juice (not low-sodium or reduced-sodium). Frozen vegetables in sauces. Grains Instant hot cereals. Bread stuffing, pancake, and biscuit mixes. Croutons. Seasoned rice or pasta mixes. Noodle soup cups. Boxed or frozen macaroni and cheese. Regular salted crackers. Self-rising flour. Meats and other proteins Meat or fish that is salted, canned, smoked, spiced, or pickled. Precooked or cured meat, such as sausages or meat loaves. Berniece Salines. Ham. Pepperoni. Hot dogs. Corned beef. Chipped beef. Salt pork. Jerky. Pickled herring. Anchovies and sardines. Regular canned tuna. Salted nuts. Dairy Processed cheese and cheese spreads. Hard cheeses. Cheese curds.  Blue cheese. Feta cheese. String cheese. Regular cottage cheese. Buttermilk. Canned milk. Fats and oils Salted butter. Regular margarine. Ghee. Bacon fat. Seasonings and condiments Onion salt, garlic salt, seasoned salt, table salt, and sea salt. Canned and packaged gravies. Worcestershire sauce. Tartar sauce. Barbecue sauce. Teriyaki sauce. Soy sauce, including reduced-sodium. Steak sauce. Fish sauce. Oyster sauce. Cocktail sauce. Horseradish that you find on the shelf. Regular ketchup and mustard. Meat flavorings and tenderizers. Bouillon cubes. Hot sauce. Pre-made or packaged marinades. Pre-made or packaged taco seasonings. Relishes. Regular salad dressings. Salsa. Other foods Salted popcorn and pretzels. Corn chips and puffs. Potato and tortilla chips. Canned or dried soups. Pizza. Frozen entrees and pot pies. The items listed above may not be a complete list of foods and beverages you should avoid. Contact a dietitian for more information. Summary Eating less sodium can help lower your blood pressure, reduce swelling, and protect your heart, liver, and kidneys. Most people on this plan should limit their sodium intake to 1,500-2,000 mg (milligrams) of sodium each day. Canned, boxed, and frozen foods are high in sodium. Restaurant foods, fast foods, and pizza are also very high in sodium. You also get sodium by adding salt to food. Try to cook at home, eat more fresh fruits and vegetables, and eat less fast food and canned, processed, or prepared foods. This information is not intended to replace advice given to you by your health care provider. Make sure you discuss any questions you have with your health care provider. Document Revised: 04/20/2019 Document Reviewed: 02/14/2019 Elsevier Patient Education  2022 [employer].

## 2021-04-15 NOTE — Progress Notes (Signed)
PCP: Lin Landsman, MD   Primary EP: Dr Rayann Heman  [name] is a 61 y.o. female who presents today for electrophysiology followup.  She underwent ILR implantation by me 06/02/20 (note reviewed). She has recently had issues with BLE edema.  Mild SOB.   She had an echo 10/16/20 which showed EF 60%, mild LVH.  She was treated with lasix. More recently, she developed facial/lip swelling with rash which was attributed to lasix given her prior "Severe" sulfa reaction. She was switched to torsemide. absolutely 04/05/21 are reviewed.  Today, she denies symptoms of palpitations, chest pain,  dizziness, presyncope, or syncope.  The patient is otherwise without complaint today.   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 "   Asthma    due to allergies    Cancer ([place])    THRYOID 2016 [place], KIDNEY CANCER MAY 2018  RIGHT RENAL CRYOABLATION   Complication of anesthesia    Diabetes mellitus    TYPE 2   Family history of adverse reaction to anesthesia    difficult for father to wake after surgery, AFTHER LOW BP WITH ANESTHESIA   HOH (hard of hearing)    Hypercholesteremia    Hypertension    PONV (postoperative nausea and vomiting)    Renal cancer, right ([place]) dx'd 07/2018, 10/2018   ablation x 2    Thyroid disease    TIA (transient ischemic attack)    LAST TIA  APRIL 2020 ON PLAVIX, reports residual effects ,02/2020   Past Surgical History:  Procedure Laterality Date   ABDOMINAL HYSTERECTOMY     COMPLETE   BIOPSY  03/02/2019   Procedure: BIOPSY;  Surgeon: Carol Ada, MD;  Location: [facility];  Service: Endoscopy;;   CHOLECYSTECTOMY     COLON SURGERY     COLONOSCOPY WITH PROPOFOL N/A 03/02/2019   Procedure: COLONOSCOPY WITH PROPOFOL;  Surgeon: Carol Ada, MD;  Location: [facility];  Service: Endoscopy;  Laterality: N/A;   ear surgeries      implantable loop recorder placement  06/02/2020   Medtronic Reveal Linq model  LNQ22 implantable loop recorder (SN [id number])    IR RADIOLOGIST EVAL & MGMT  01/12/2017   IR RADIOLOGIST EVAL & MGMT  06/16/2017   IR RADIOLOGIST EVAL & MGMT  08/31/2017   IR RADIOLOGIST EVAL & MGMT  12/13/2017   IR RADIOLOGIST EVAL & MGMT  03/16/2018   IR RADIOLOGIST EVAL & MGMT  11/01/2018   IR RADIOLOGIST EVAL & MGMT  12/13/2018   IR RADIOLOGIST EVAL & MGMT  02/15/2019   IR RADIOLOGIST EVAL & MGMT  08/16/2019   IR RADIOLOGIST EVAL & MGMT  11/22/2019   IR RADIOLOGIST EVAL & MGMT  06/05/2020   IR RADIOLOGIST EVAL & MGMT  11/27/2020   KNEE ARTHROSCOPY     LEFT HEART CATHETERIZATION WITH CORONARY ANGIOGRAM N/A 04/24/2014   Procedure: LEFT HEART CATHETERIZATION WITH CORONARY ANGIOGRAM;  Surgeon: Burnell Blanks, MD;  [facility]: [facility] C[facility] LAB;  Service: Cardiovascular;  Laterality: N/A;   POLYPECTOMY  03/02/2019   Procedure: POLYPECTOMY;  Surgeon: Carol Ada, MD;  Location: [facility];  Service: Endoscopy;;   RADIOFREQUENCY ABLATION Right 08/05/2017   Procedure: CT RENAL CRYO AND BIOPSY;  Surgeon: Sandi Mariscal, MD;  Location: [facility];  Service: Anesthesiology;  Laterality: Right;   RADIOFREQUENCY ABLATION Right 11/15/2018   Procedure: RIGHT RENAL CRYOABLATION;  Surgeon: Sandi Mariscal,  MD;  Location: [facility];  Service: Anesthesiology;  Laterality: Right;   THYROIDECTOMY N/A 09/20/2014   Procedure: TOTAL THYROIDECTOMY;  Surgeon: Armandina Gemma, MD;  Location: [facility];  Service: General;  Laterality: N/A;   TONSILLECTOMY      ROS- all systems are reviewed and negatives except as per HPI above  Current Outpatient Medications  Medication Sig Dispense Refill   acetaminophen (TYLENOL) 500 MG tablet Take 2 tablets (1,000 mg total) by mouth every 6 (six) hours as needed (body aches). 30 tablet 0   albuterol (VENTOLIN HFA) 108 (90 Base) MCG/ACT inhaler Inhale 2 puffs every 4 to 6 hours as needed. 18 g 2   benzonatate (TESSALON) 200 MG capsule Take 1 capsule by mouth 3 times a day 30 capsule 1    CALCIUM-MAGNESIUM-ZINC PO Take 2 tablets by mouth 2 (two) times daily.     clopidogrel (PLAVIX) 75 MG tablet Take 1 tablet (75 mg total) by mouth daily. 30 tablet 11   diclofenac Sodium (VOLTAREN) 1 % GEL Apply to affected area(s) twice daily as directed 100 g 0   diphenhydrAMINE (BENADRYL) 25 MG tablet Take 25 mg by mouth at bedtime.     fluticasone (FLONASE) 50 MCG/ACT nasal spray Place 2 sprays into both nostrils daily.     ibuprofen (ADVIL) 200 MG tablet Take 800 mg by mouth every 8 (eight) hours as needed for headache or moderate pain.     levothyroxine (SYNTHROID) 200 MCG tablet Take 1 tablet by mouth daily. 90 tablet 3   lisinopril (ZESTRIL) 10 MG tablet Take 1 tablet by mouth once a day. 30 tablet 3   metFORMIN (GLUCOPHAGE) 500 MG tablet Take 1 tablet (500 mg total) by mouth 2 (two) times daily with a meal. 60 tablet 11   metoprolol tartrate (LOPRESSOR) 100 MG tablet Take one tablet by mouth twice daily. 60 tablet 5   molnupiravir EUA (LAGEVRIO) 200 MG CAPS capsule Take 4 capsules by mouth 2 times a day 40 capsule 0   mupirocin ointment (BACTROBAN) 2 % Apply 1 application topically in the morning and at bedtime. Mole removal site on back     nitrofurantoin, macrocrystal-monohydrate, (MACROBID) 100 MG capsule Take 1 capsule (100 mg total) by mouth 2 (two) times daily. 10 capsule 0   ondansetron (ZOFRAN ODT) 4 MG disintegrating tablet Take 1 tablet (4 mg total) by mouth every 8 (eight) hours as needed for nausea or vomiting. 10 tablet 0   pantoprazole (PROTONIX) 40 MG tablet Take 1 tablet (40 mg total) by mouth daily. 90 tablet 3   potassium chloride SA (KLOR-CON M) 20 MEQ tablet TAKE 2 TABLETS BY MOUTH TWICE DAILY WITH FOOD 120 tablet 1   Probiotic Product (PROBIOTIC PO) Take 1 tablet by mouth daily.     Semaglutide,0.25 or 0.5MG /DOS, (OZEMPIC, 0.25 OR 0.5 MG/DOSE,) 2 MG/1.5ML SOPN Inject 0.5 mg subcutaneously once a week. 1.5 mL 1   spironolactone (ALDACTONE) 25 MG tablet Take 1 tablet by  mouth 2 times a day 60 tablet 1   zinc gluconate 50 MG tablet Take 50 mg by mouth daily as needed (flu season).     atorvastatin (LIPITOR) 40 MG tablet Take 1 tablet (40 mg total) by mouth at bedtime. 30 tablet 3   Torsemide 40 MG TABS Take 1 tablet by mouth in the morning and at bedtime for 10 days. 20 tablet 0   No current facility-administered medications for this visit.    Physical Exam: Vitals:   04/15/21 1522  BP: 126/80  Pulse: 82  SpO2: 96%  Weight: 218 lb (98.9 kg)  Height: 5\' 6"  (1.676 m)    GEN- The patient is overweight appearing, alert and oriented x 3 today.   Head- normocephalic, atraumatic Eyes-  Sclera clear, conjunctiva pink Ears- hearing intact Oropharynx- clear Lungs- Clear to ausculation bilaterally, normal work of breathing Heart- Regular rate and rhythm, no murmurs, rubs or gallops, PMI not laterally displaced GI- soft, NT, ND, + BS Extremities- no clubbing, cyanosis, mild edema   Wt Readings from Last 3 Encounters:  04/15/21 218 lb (98.9 kg)  03/25/21 231 lb 7.7 oz (105 kg)  12/04/20 216 lb (98 kg)    Assessment and Plan:  Acute on chronic diastolic dysfunction Recently diagnosed Echo 7/22 reviewed I suspect dietary indiscretion as a major factor.  We discussed importance of sodium restriction at length She has been started on torsemide and subsequently spironolactone by PCP.  She feels that her edema/ weight are improved No changes today Bmet, probnp are ordered  2. Prior TIA No AF by ILR review  Return in 6 weeks to establish and follow with the general cardiology team.  EP to see as needed going forward  Thompson Grayer MD, [facility] 04/15/2021 3:45 PM

## 2021-04-16 LAB — BASIC METABOLIC PANEL
BUN/Creatinine Ratio: 16 (ref 9–23)
BUN: 23 mg/dL (ref 6–24)
CO2: 15 mmol/L — ABNORMAL LOW (ref 20–29)
Calcium: 9.1 mg/dL (ref 8.7–10.2)
Chloride: 104 mmol/L (ref 96–106)
Creatinine, Ser: 1.4 mg/dL — ABNORMAL HIGH (ref 0.57–1.00)
Glucose: 173 mg/dL — ABNORMAL HIGH (ref 70–99)
Potassium: 5.8 mmol/L — ABNORMAL HIGH (ref 3.5–5.2)
Sodium: 135 mmol/L (ref 134–144)
eGFR: 43 mL/min/{1.73_m2} — ABNORMAL LOW (ref >59)

## 2021-04-16 LAB — PRO B NATRIURETIC PEPTIDE: NT-Pro BNP: 81 pg/mL (ref 0–287)

## 2021-04-20 ENCOUNTER — Other Ambulatory Visit (HOSPITAL_COMMUNITY): Payer: Self-pay

## 2021-04-20 ENCOUNTER — Telehealth: Payer: Self-pay

## 2021-04-20 NOTE — Telephone Encounter (Signed)
-----   Message from Thompson Grayer, MD sent at 04/18/2021  8:41 PM EST ----- Results reviewed.  Elevated K and creat noted.  Sonia Baller, please inform the patient of result. I will route to primary care also.  Stop KDur Reduce spironolactone to 25mg  daily Repeat bmet in a week.

## 2021-04-21 ENCOUNTER — Other Ambulatory Visit: Payer: Self-pay

## 2021-04-21 ENCOUNTER — Other Ambulatory Visit (HOSPITAL_COMMUNITY): Payer: Self-pay

## 2021-04-21 DIAGNOSIS — I10 Essential (primary) hypertension: Secondary | ICD-10-CM

## 2021-04-21 MED ORDER — LISINOPRIL 10 MG PO TABS
ORAL_TABLET | ORAL | 1 refills | Status: DC
  Filled 2021-04-21: qty 30, 30d supply, fill #0
  Filled 2021-05-19: qty 30, 30d supply, fill #1

## 2021-04-21 NOTE — Telephone Encounter (Signed)
STOP potassium. DECREASE spironolactone to 25 mg by mouth once daily.

## 2021-04-23 ENCOUNTER — Other Ambulatory Visit (HOSPITAL_COMMUNITY): Payer: Self-pay

## 2021-04-24 NOTE — Progress Notes (Signed)
Carelink Summary Report / Loop Recorder 

## 2021-04-27 ENCOUNTER — Other Ambulatory Visit (HOSPITAL_COMMUNITY): Payer: Self-pay

## 2021-04-27 ENCOUNTER — Telehealth: Payer: Self-pay | Admitting: Internal Medicine

## 2021-04-27 NOTE — Telephone Encounter (Signed)
Returned the call to the pt.  She is aware that Dr. Rayann Heman isn't in the office to ok the refill for Spironolactone, as per lab note, looks like pt's PCP fills it. After further review, pt should have a refill on her previous dose at the pharmacy, and she has been advised to remember to only take 1 per day, as per Dr. Jackalyn Lombard instructions.  Pt verbalized understanding and was very appreciative of the assistance.

## 2021-04-27 NOTE — Telephone Encounter (Signed)
°*  STAT* If patient is at the pharmacy, call can be transferred to refill team.   1. Which medications need to be refilled? (please list name of each medication and dose if known) spironolactone (ALDACTONE) 25 MG tablet  2. Which pharmacy/location (including street and city if local pharmacy) is medication to be sent to? [facility]  3. Do they need a 30 day or 90 day supply? 30 day

## 2021-04-28 ENCOUNTER — Other Ambulatory Visit: Payer: Medicare PPO | Admitting: *Deleted

## 2021-04-28 ENCOUNTER — Other Ambulatory Visit: Payer: Self-pay

## 2021-04-28 ENCOUNTER — Other Ambulatory Visit (HOSPITAL_COMMUNITY): Payer: Self-pay

## 2021-04-28 DIAGNOSIS — I10 Essential (primary) hypertension: Secondary | ICD-10-CM

## 2021-04-28 LAB — BASIC METABOLIC PANEL
BUN/Creatinine Ratio: 17 (ref 9–23)
BUN: 20 mg/dL (ref 6–24)
CO2: 18 mmol/L — ABNORMAL LOW (ref 20–29)
Calcium: 9 mg/dL (ref 8.7–10.2)
Chloride: 110 mmol/L — ABNORMAL HIGH (ref 96–106)
Creatinine, Ser: 1.19 mg/dL — ABNORMAL HIGH (ref 0.57–1.00)
Glucose: 197 mg/dL — ABNORMAL HIGH (ref 70–99)
Potassium: 4.9 mmol/L (ref 3.5–5.2)
Sodium: 138 mmol/L (ref 134–144)
eGFR: 53 mL/min/{1.73_m2} — ABNORMAL LOW (ref >59)

## 2021-04-29 ENCOUNTER — Other Ambulatory Visit (HOSPITAL_COMMUNITY): Payer: Self-pay

## 2021-05-05 ENCOUNTER — Other Ambulatory Visit (HOSPITAL_COMMUNITY): Payer: Self-pay

## 2021-05-16 ENCOUNTER — Other Ambulatory Visit (HOSPITAL_COMMUNITY): Payer: Self-pay

## 2021-05-16 LAB — CUP PACEART REMOTE DEVICE CHECK
Date Time Interrogation Session: 20230217230114
Implantable Pulse Generator Implant Date: 20220307

## 2021-05-18 ENCOUNTER — Other Ambulatory Visit (HOSPITAL_COMMUNITY): Payer: Self-pay

## 2021-05-18 ENCOUNTER — Ambulatory Visit (INDEPENDENT_AMBULATORY_CARE_PROVIDER_SITE_OTHER): Payer: Medicare PPO

## 2021-05-18 DIAGNOSIS — G459 Transient cerebral ischemic attack, unspecified: Secondary | ICD-10-CM | POA: Diagnosis not present

## 2021-05-19 ENCOUNTER — Other Ambulatory Visit (HOSPITAL_COMMUNITY): Payer: Self-pay

## 2021-05-19 MED ORDER — ATORVASTATIN CALCIUM 40 MG PO TABS
40.0000 mg | ORAL_TABLET | Freq: Every day | ORAL | 3 refills | Status: DC
  Filled 2021-05-19: qty 30, 30d supply, fill #0
  Filled 2021-06-15: qty 30, 30d supply, fill #1
  Filled 2021-07-17: qty 30, 30d supply, fill #2
  Filled 2021-08-17: qty 30, 30d supply, fill #3

## 2021-05-20 ENCOUNTER — Other Ambulatory Visit (HOSPITAL_COMMUNITY): Payer: Self-pay

## 2021-05-22 NOTE — Progress Notes (Signed)
Carelink Summary Report / Loop Recorder 

## 2021-05-25 ENCOUNTER — Other Ambulatory Visit (HOSPITAL_COMMUNITY): Payer: Self-pay

## 2021-05-25 MED ORDER — ONDANSETRON HCL 8 MG PO TABS
8.0000 mg | ORAL_TABLET | Freq: Three times a day (TID) | ORAL | 4 refills | Status: DC
  Filled 2021-05-25: qty 20, 5d supply, fill #0

## 2021-05-25 MED ORDER — CIPROFLOXACIN HCL 250 MG PO TABS
250.0000 mg | ORAL_TABLET | Freq: Every day | ORAL | 0 refills | Status: DC
  Filled 2021-05-25: qty 7, 7d supply, fill #0

## 2021-05-26 NOTE — Progress Notes (Unsigned)
Office Visit    Patient Name: [name] Date of Encounter: 05/26/2021  Primary Care Provider:  Lin Landsman, MD Primary Cardiologist:  None  Chief Complaint    61 year old female with a history of chronic diastolic heart failure, hypertension, hyperlipidemia, recurrent stroke s/p loop recorder implant, type 2 diabetes, thyroid cancer, right renal cancer, anemia, asthma, and arthritis who presents for follow-up related to chronic diastolic heart failure.  Past Medical History    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 "   Asthma    due to allergies    Cancer ([place])    THRYOID 2016 [place], KIDNEY CANCER MAY 2018  RIGHT RENAL CRYOABLATION   Complication of anesthesia    Diabetes mellitus    TYPE 2   Family history of adverse reaction to anesthesia    difficult for father to wake after surgery, AFTHER LOW BP WITH ANESTHESIA   HOH (hard of hearing)    Hypercholesteremia    Hypertension    PONV (postoperative nausea and vomiting)    Renal cancer, right ([place]) dx'd 07/2018, 10/2018   ablation x 2    Thyroid disease    TIA (transient ischemic attack)    LAST TIA  APRIL 2020 ON PLAVIX, reports residual effects ,02/2020   Past Surgical History:  Procedure Laterality Date   ABDOMINAL HYSTERECTOMY     COMPLETE   BIOPSY  03/02/2019   Procedure: BIOPSY;  Surgeon: Carol Ada, MD;  Location: [facility];  Service: Endoscopy;;   CHOLECYSTECTOMY     COLON SURGERY     COLONOSCOPY WITH PROPOFOL N/A 03/02/2019   Procedure: COLONOSCOPY WITH PROPOFOL;  Surgeon: Carol Ada, MD;  Location: [facility];  Service: Endoscopy;  Laterality: N/A;   ear surgeries      implantable loop recorder placement  06/02/2020   Medtronic Reveal Linq model [id number] implantable loop recorder (SN [id number])    IR RADIOLOGIST EVAL & MGMT  01/12/2017   IR RADIOLOGIST EVAL & MGMT  06/16/2017   IR RADIOLOGIST EVAL & MGMT  08/31/2017   IR RADIOLOGIST  EVAL & MGMT  12/13/2017   IR RADIOLOGIST EVAL & MGMT  03/16/2018   IR RADIOLOGIST EVAL & MGMT  11/01/2018   IR RADIOLOGIST EVAL & MGMT  12/13/2018   IR RADIOLOGIST EVAL & MGMT  02/15/2019   IR RADIOLOGIST EVAL & MGMT  08/16/2019   IR RADIOLOGIST EVAL & MGMT  11/22/2019   IR RADIOLOGIST EVAL & MGMT  06/05/2020   IR RADIOLOGIST EVAL & MGMT  11/27/2020   KNEE ARTHROSCOPY     LEFT HEART CATHETERIZATION WITH CORONARY ANGIOGRAM N/A 04/24/2014   Procedure: LEFT HEART CATHETERIZATION WITH CORONARY ANGIOGRAM;  Surgeon: Burnell Blanks, MD;  Location: [facility] CATH LAB;  Service: Cardiovascular;  Laterality: N/A;   POLYPECTOMY  03/02/2019   Procedure: POLYPECTOMY;  Surgeon: Carol Ada, MD;  Location: [facility];  Service: Endoscopy;;   RADIOFREQUENCY ABLATION Right 08/05/2017   Procedure: CT RENAL CRYO AND BIOPSY;  Surgeon: Sandi Mariscal, MD;  Location: [facility];  Service: Anesthesiology;  Laterality: Right;   RADIOFREQUENCY ABLATION Right 11/15/2018   Procedure: RIGHT RENAL CRYOABLATION;  Surgeon: Sandi Mariscal, MD;  Location: [facility];  Service: Anesthesiology;  Laterality: Right;   THYROIDECTOMY N/A 09/20/2014   Procedure: TOTAL THYROIDECTOMY;  Surgeon: Armandina Gemma, MD;  Location: [facility];  Service: General;  Laterality: N/A;  TONSILLECTOMY      Allergies  Allergies  Allergen Reactions   Amoxicillin Other (See Comments) and Anaphylaxis    Resulted in ambulance per pt   Lorabid [Loracarbef] Anaphylaxis   Augmentin [Amoxicillin-Pot Clavulanate] Hives, Nausea And Vomiting and Other (See Comments)    Chest pain   Codeine Nausea And Vomiting   Furosemide Swelling    Patient states has reaction based on sulfa allergy   Gadolinium Derivatives Itching, Swelling and Other (See Comments)    Patient will require 13 hr prep prior to administration of gadolinium     Sulfa Drugs Cross Reactors Other (See Comments)    Severe allergic reaction as a child - was not told symptoms... states she has had sulfa reaction to  furosemide   Benicar [Olmesartan Medoxomil] Swelling and Rash   Other Rash    Green peas    History of Present Illness    61 year old female with the above past medical history including chronic diastolic heart failure, hypertension, hyperlipidemia, recurrent stroke s/p loop recorder implant, type 2 diabetes, thyroid cancer, right renal cancer, anemia, asthma, and arthritis.  Previously followed by Dr. Rayann Heman.  She was referred to Dr. Rayann Heman in March 2022 for EP consultation for evaluation of possible arrhythmogenic cause of stroke given history of recurrent stroke/TIA most recently in July 2022 (follows with neurology).  Echocardiogram in 02/2020 showed EF 65%, mild LVH, grade 2 diastolic dysfunction. She underwent implantable loop recorder insertion in 2022, which showed no evidence of arrhythmia.  Most recent echocardiogram in July 2022 showed EF 60 to 65%, mild concentric LVH, indeterminate diastolic parameters.  Her PCP started her on torsemide and spironolactone for chronic diastolic heart failure.  She last saw Dr. Rayann Heman on 04/15/2021 and was stable overall from a cardiac standpoint.  She is advised to follow-up with general cardiology to establish care.  She presents today for follow-up.  Since her last visit  Chronic diastolic heart failure: Hypertension: Hyperlipidemia: Recurrent stroke: Type 2 diabetes: Disposition:  Home Medications    Current Outpatient Medications  Medication Sig Dispense Refill   acetaminophen (TYLENOL) 500 MG tablet Take 2 tablets (1,000 mg total) by mouth every 6 (six) hours as needed (body aches). 30 tablet 0   albuterol (VENTOLIN HFA) 108 (90 Base) MCG/ACT inhaler Inhale 2 puffs every 4 to 6 hours as needed. 18 g 2   atorvastatin (LIPITOR) 40 MG tablet Take 1 tablet (40 mg total) by mouth at bedtime. 30 tablet 3   atorvastatin (LIPITOR) 40 MG tablet Take 1 tablet by mouth at bedtime. 30 tablet 3   benzonatate (TESSALON) 200 MG capsule Take 1 capsule by  mouth 3 times a day 30 capsule 1   CALCIUM-MAGNESIUM-ZINC PO Take 2 tablets by mouth 2 (two) times daily.     ciprofloxacin (CIPRO) 250 MG tablet Take 1 tablet by mouth daily. 7 tablet 0   clopidogrel (PLAVIX) 75 MG tablet Take 1 tablet (75 mg total) by mouth daily. 30 tablet 11   diclofenac Sodium (VOLTAREN) 1 % GEL Apply to affected area(s) twice daily as directed 100 g 0   diphenhydrAMINE (BENADRYL) 25 MG tablet Take 25 mg by mouth at bedtime.     fluticasone (FLONASE) 50 MCG/ACT nasal spray Place 2 sprays into both nostrils daily.     ibuprofen (ADVIL) 200 MG tablet Take 800 mg by mouth every 8 (eight) hours as needed for headache or moderate pain.     levothyroxine (SYNTHROID) 200 MCG tablet Take 1 tablet by mouth  daily. 90 tablet 3   lisinopril (ZESTRIL) 10 MG tablet Take 1 tablet by mouth once a day. 30 tablet 3   lisinopril (ZESTRIL) 10 MG tablet Take 1 tablet by mouth once a day 30 tablet 1   metFORMIN (GLUCOPHAGE) 500 MG tablet Take 1 tablet (500 mg total) by mouth 2 (two) times daily with a meal. 60 tablet 11   metoprolol tartrate (LOPRESSOR) 100 MG tablet Take one tablet by mouth twice daily. 60 tablet 5   molnupiravir EUA (LAGEVRIO) 200 MG CAPS capsule Take 4 capsules by mouth 2 times a day 40 capsule 0   mupirocin ointment (BACTROBAN) 2 % Apply 1 application topically in the morning and at bedtime. Mole removal site on back     nitrofurantoin, macrocrystal-monohydrate, (MACROBID) 100 MG capsule Take 1 capsule (100 mg total) by mouth 2 (two) times daily. 10 capsule 0   ondansetron (ZOFRAN ODT) 4 MG disintegrating tablet Take 1 tablet (4 mg total) by mouth every 8 (eight) hours as needed for nausea or vomiting. 10 tablet 0   ondansetron (ZOFRAN) 8 MG tablet TAKE 1 TABLET BY MOUTH 3 TIMES DAILY 20 tablet 4   pantoprazole (PROTONIX) 40 MG tablet Take 1 tablet (40 mg total) by mouth daily. 90 tablet 3   Probiotic Product (PROBIOTIC PO) Take 1 tablet by mouth daily.     Semaglutide,0.25  or 0.5MG /DOS, (OZEMPIC, 0.25 OR 0.5 MG/DOSE,) 2 MG/1.5ML SOPN Inject 0.5 mg subcutaneously once a week. 1.5 mL 1   spironolactone (ALDACTONE) 25 MG tablet Take 1 tablet by mouth 2 times a day (Patient taking differently: Take 25 mg by mouth daily.) 60 tablet 1   Torsemide 40 MG TABS Take 1 tablet by mouth in the morning and at bedtime for 10 days. 20 tablet 0   zinc gluconate 50 MG tablet Take 50 mg by mouth daily as needed (flu season).     No current facility-administered medications for this visit.     Review of Systems    ***.  All other systems reviewed and are otherwise negative except as noted above.    Physical Exam    VS:  There were no vitals taken for this visit. , BMI There is no height or weight on file to calculate BMI.     GEN: Well nourished, well developed, in no acute distress. HEENT: normal. Neck: Supple, no JVD, carotid bruits, or masses. Cardiac: RRR, no murmurs, rubs, or gallops. No clubbing, cyanosis, edema.  Radials/DP/PT 2+ and equal bilaterally.  Respiratory:  Respirations regular and unlabored, clear to auscultation bilaterally. GI: Soft, nontender, nondistended, BS + x 4. MS: no deformity or atrophy. Skin: warm and dry, no rash. Neuro:  Strength and sensation are intact. Psych: Normal affect.  Accessory Clinical Findings    ECG personally reviewed by me today - *** - no acute changes.  Lab Results  Component Value Date   WBC 6.5 04/05/2021   HGB 12.1 04/05/2021   HCT 37.8 04/05/2021   MCV 90.4 04/05/2021   PLT 297 04/05/2021   Lab Results  Component Value Date   CREATININE 1.19 (H) 04/28/2021   BUN 20 04/28/2021   NA 138 04/28/2021   K 4.9 04/28/2021   CL 110 (H) 04/28/2021   CO2 18 (L) 04/28/2021   Lab Results  Component Value Date   ALT 18 10/15/2020   AST 18 10/15/2020   ALKPHOS 58 10/15/2020   BILITOT 0.8 10/15/2020   Lab Results  Component Value Date  CHOL 181 10/15/2020   HDL 38 (L) 10/15/2020   LDLCALC 81 10/15/2020    TRIG 311 (H) 10/15/2020   CHOLHDL 4.8 10/15/2020    Lab Results  Component Value Date   HGBA1C 9.0 (H) 10/15/2020    Assessment & Plan    1.  ***   Lenna Sciara, NP 05/26/2021, 2:11 PM

## 2021-05-27 ENCOUNTER — Other Ambulatory Visit (HOSPITAL_COMMUNITY): Payer: Self-pay

## 2021-05-28 ENCOUNTER — Ambulatory Visit (HOSPITAL_BASED_OUTPATIENT_CLINIC_OR_DEPARTMENT_OTHER): Payer: Medicare PPO | Admitting: Nurse Practitioner

## 2021-06-06 ENCOUNTER — Other Ambulatory Visit (HOSPITAL_COMMUNITY): Payer: Self-pay

## 2021-06-08 ENCOUNTER — Telehealth: Payer: Self-pay

## 2021-06-08 ENCOUNTER — Other Ambulatory Visit (HOSPITAL_COMMUNITY): Payer: Self-pay

## 2021-06-08 ENCOUNTER — Ambulatory Visit: Payer: Medicare Other | Admitting: Adult Health

## 2021-06-08 MED ORDER — METOPROLOL TARTRATE 100 MG PO TABS
100.0000 mg | ORAL_TABLET | Freq: Two times a day (BID) | ORAL | 0 refills | Status: DC
  Filled 2021-06-08: qty 60, 30d supply, fill #0

## 2021-06-08 NOTE — Telephone Encounter (Signed)
Called and spoke with patient to schedule appt.  She states that she babysits her grandchildren and will have to make arrangements for them while she comes for appt.  Pt was provided AFib Clinic phone number and she will call us back to schedule when she has arrangements made for grandchildren. ?

## 2021-06-08 NOTE — Telephone Encounter (Signed)
Patient called back and has made arrangements for her grandchildren to stay with someone.  She is agreeable to appt 06/10/21 with [facility] Clinic.  Directions to clinic provided to pt. ?

## 2021-06-08 NOTE — Telephone Encounter (Signed)
Alert from [employer]  ? ?LINQ alert received.  ?6 AF events, 2-73mn in duration, controlled ventricular rates ? ?Spoke with patient informed her of the findings advised patient that I would sending this information over the the AF clinic for review and schedule apt to discuss [place]  ? ? ? ? ? ? ? ?

## 2021-06-09 ENCOUNTER — Ambulatory Visit (HOSPITAL_COMMUNITY): Payer: Medicare PPO | Admitting: Nurse Practitioner

## 2021-06-09 ENCOUNTER — Other Ambulatory Visit (HOSPITAL_COMMUNITY): Payer: Self-pay

## 2021-06-09 NOTE — Progress Notes (Signed)
? ? ?Primary Care Physician: Lin Landsman, MD ?Primary Cardiologist: none ?Primary Electrophysiologist: Dr Rayann Heman ?Referring Physician: Dr Rayann Heman ? ? ?[name] is a 61 y.o. female with a history of DM, HLD, HTN, renal cancer s/p right renal cryoablation, TIA, atrial fibrillation who presents for follow up in the [facility].  The patient was initially diagnosed with atrial fibrillation 06/07/21 on her ILR which was placed 05/2020 for TIA. Patient has for a CHADS2VASC score of 5. Patient does report that she felt brief palpitations at the time of her afib. No other associated symptoms. She denies alcohol use but she does admit to snoring and daytime somnolence.  ? ?Today, she denies symptoms of chest pain, shortness of breath, orthopnea, PND, lower extremity edema, dizziness, presyncope, syncope, bleeding, or neurologic sequela. The patient is tolerating medications without difficulties and is otherwise without complaint today.  ? ? ?Atrial Fibrillation Risk Factors: ? ?she does have symptoms or diagnosis of sleep apnea. ?she is agreeable for sleep study.  ?she does not have a history of rheumatic fever. ?she does not have a history of alcohol use. ?The patient does not have a history of early familial atrial fibrillation or other arrhythmias. ? ?she has a BMI of Body mass index is 36.06 kg/m?Marland KitchenMarland Kitchen ?Filed Weights  ? 06/10/21 0832  ?Weight: 101.3 kg  ? ? ?Family History  ?Problem Relation Age of Onset  ? Heart failure Mother   ? Diabetes Mother   ? Stroke Mother   ? Stroke Father   ? Heart failure Father   ? Cancer Father   ? ? ? ?Atrial Fibrillation Management history: ? ?Previous antiarrhythmic drugs: none ?Previous cardioversions: none ?Previous ablations: none ?CHADS2VASC score: 5 ?Anticoagulation history: none ? ? ?Past Medical History:  ?Diagnosis Date  ? Anemia   ? hx of   ? Arthritis   ? " in my ankle "  ? Asthma   ? due to allergies   ? Cancer [facility])   ? THRYOID 2016 [place] WITH  SURGERY, KIDNEY CANCER MAY 2018  RIGHT RENAL CRYOABLATION  ? Complication of anesthesia   ? Diabetes mellitus   ? TYPE 2  ? Family history of adverse reaction to anesthesia   ? difficult for father to wake after surgery, AFTHER LOW BP WITH ANESTHESIA  ? HOH (hard of hearing)   ? Hypercholesteremia   ? Hypertension   ? PONV (postoperative nausea and vomiting)   ? Renal cancer, right ([place]) dx'd 07/2018, 10/2018  ? ablation x 2   ? Thyroid disease   ? TIA (transient ischemic attack)   ? LAST TIA  APRIL 2020 ON PLAVIX, reports residual effects ,02/2020  ? ?Past Surgical History:  ?Procedure Laterality Date  ? ABDOMINAL HYSTERECTOMY    ? COMPLETE  ? BIOPSY  03/02/2019  ? Procedure: BIOPSY;  Surgeon: Carol Ada, MD;  Location: [facility];  Service: Endoscopy;;  ? CHOLECYSTECTOMY    ? COLON SURGERY    ? COLONOSCOPY WITH PROPOFOL N/A 03/02/2019  ? Procedure: COLONOSCOPY WITH PROPOFOL;  Surgeon: Carol Ada, MD;  Location: [facility];  Service: Endoscopy;  Laterality: N/A;  ? ear surgeries     ? implantable loop recorder placement  06/02/2020  ? Medtronic Reveal Linq model [id number] implantable loop recorder (SN [id number])   ? IR RADIOLOGIST EVAL & MGMT  01/12/2017  ? IR RADIOLOGIST EVAL & MGMT  06/16/2017  ? IR RADIOLOGIST EVAL & MGMT  08/31/2017  ? IR RADIOLOGIST EVAL & MGMT  12/13/2017  ? IR RADIOLOGIST EVAL & MGMT  03/16/2018  ? IR RADIOLOGIST EVAL & MGMT  11/01/2018  ? IR RADIOLOGIST EVAL & MGMT  12/13/2018  ? IR RADIOLOGIST EVAL & MGMT  02/15/2019  ? IR RADIOLOGIST EVAL & MGMT  08/16/2019  ? IR RADIOLOGIST EVAL & MGMT  11/22/2019  ? IR RADIOLOGIST EVAL & MGMT  06/05/2020  ? IR RADIOLOGIST EVAL & MGMT  11/27/2020  ? KNEE ARTHROSCOPY    ? LEFT HEART CATHETERIZATION WITH CORONARY ANGIOGRAM N/A 04/24/2014  ? Procedure: LEFT HEART CATHETERIZATION WITH CORONARY ANGIOGRAM;  Surgeon: Burnell Blanks, MD;  Location: [facility] CATH LAB;  Service: Cardiovascular;  Laterality: N/A;  ? POLYPECTOMY  03/02/2019  ? Procedure: POLYPECTOMY;   Surgeon: Carol Ada, MD;  Location: [facility];  Service: Endoscopy;;  ? RADIOFREQUENCY ABLATION Right 08/05/2017  ? Procedure: CT RENAL CRYO AND BIOPSY;  Surgeon: Sandi Mariscal, MD;  Location: [facility];  Service: Anesthesiology;  Laterality: Right;  ? RADIOFREQUENCY ABLATION Right 11/15/2018  ? Procedure: RIGHT RENAL CRYOABLATION;  Surgeon: Sandi Mariscal, MD;  Location: [facility];  Service: Anesthesiology;  Laterality: Right;  ? THYROIDECTOMY N/A 09/20/2014  ? Procedure: TOTAL THYROIDECTOMY;  Surgeon: Armandina Gemma, MD;  Location: [facility];  Service: General;  Laterality: N/A;  ? TONSILLECTOMY    ? ? ?Current Outpatient Medications  ?Medication Sig Dispense Refill  ? acetaminophen (TYLENOL) 500 MG tablet Take 2 tablets (1,000 mg total) by mouth every 6 (six) hours as needed (body aches). 30 tablet 0  ? albuterol (VENTOLIN HFA) 108 (90 Base) MCG/ACT inhaler Inhale 2 puffs every 4 to 6 hours as needed. 18 g 2  ? atorvastatin (LIPITOR) 40 MG tablet Take 1 tablet (40 mg total) by mouth at bedtime. 30 tablet 3  ? atorvastatin (LIPITOR) 40 MG tablet Take 1 tablet by mouth at bedtime. 30 tablet 3  ? CALCIUM-MAGNESIUM-ZINC PO Take 2 tablets by mouth 2 (two) times daily.    ? clopidogrel (PLAVIX) 75 MG tablet Take 1 tablet (75 mg total) by mouth daily. 30 tablet 11  ? diclofenac Sodium (VOLTAREN) 1 % GEL Apply to affected area(s) twice daily as directed 100 g 0  ? diphenhydrAMINE (BENADRYL) 25 MG tablet Take 25 mg by mouth at bedtime.    ? fluticasone (FLONASE) 50 MCG/ACT nasal spray Place 2 sprays into both nostrils daily.    ? ibuprofen (ADVIL) 200 MG tablet Take 800 mg by mouth every 8 (eight) hours as needed for headache or moderate pain.    ? levothyroxine (SYNTHROID) 200 MCG tablet Take 1 tablet by mouth daily. 90 tablet 3  ? lisinopril (ZESTRIL) 10 MG tablet Take 1 tablet by mouth once a day 30 tablet 1  ? metFORMIN (GLUCOPHAGE) 500 MG tablet Take 1 tablet (500 mg total) by mouth 2 (two) times daily with a meal. 60 tablet 11  ?  metoprolol tartrate (LOPRESSOR) 100 MG tablet Take 1 tablet (100 mg total) by mouth 2 (two) times daily. 60 tablet 0  ? mupirocin ointment (BACTROBAN) 2 % Apply 1 application topically in the morning and at bedtime. Mole removal site on back    ? ondansetron (ZOFRAN) 8 MG tablet TAKE 1 TABLET BY MOUTH 3 TIMES DAILY 20 tablet 4  ? pantoprazole (PROTONIX) 40 MG tablet Take 1 tablet (40 mg total) by mouth daily. 90 tablet 3  ? Probiotic Product (PROBIOTIC PO) Take 1 tablet by mouth daily.    ? Semaglutide,0.25 or 0.'5MG'$ /DOS, (OZEMPIC, 0.25 OR 0.5 MG/DOSE,) 2  MG/1.5ML SOPN Inject 0.5 mg subcutaneously once a week. 1.5 mL 1  ? spironolactone (ALDACTONE) 25 MG tablet Take 1 tablet by mouth 2 times a day (Patient taking differently: Take 25 mg by mouth daily.) 60 tablet 1  ? zinc gluconate 50 MG tablet Take 50 mg by mouth daily as needed (flu season).    ? Torsemide 40 MG TABS Take 1 tablet by mouth in the morning and at bedtime for 10 days. 20 tablet 0  ? ?No current facility-administered medications for this encounter.  ? ? ?Allergies  ?Allergen Reactions  ? Amoxicillin Other (See Comments) and Anaphylaxis  ?  Resulted in ambulance per pt  ? Lorabid [Loracarbef] Anaphylaxis  ? Augmentin [Amoxicillin-Pot Clavulanate] Hives, Nausea And Vomiting and Other (See Comments)  ?  Chest pain  ? Codeine Nausea And Vomiting  ? Furosemide Swelling  ?  Patient states has reaction based on sulfa allergy  ? Gadolinium Derivatives Itching, Swelling and Other (See Comments)  ?  Patient will require 13 hr prep prior to administration of gadolinium    ? Sulfa Drugs Cross Reactors Other (See Comments)  ?  Severe allergic reaction as a child - was not told symptoms... states she has had sulfa reaction to furosemide  ? Benicar [Olmesartan Medoxomil] Swelling and Rash  ? Other Rash  ?  Green peas  ? ? ?Social History  ? ?Socioeconomic History  ? Marital status: Married  ?  Spouse name: Glendell Docker  ? Number of children: 2  ? Years of education: 86  ?  Highest education level: Not on file  ?Occupational History  ?  Comment: NA, disabled  ?Tobacco Use  ? Smoking status: Never  ? Smokeless tobacco: Never  ?Vaping Use  ? Vaping Use: Never used  ?Substance and Sex

## 2021-06-10 ENCOUNTER — Ambulatory Visit (HOSPITAL_COMMUNITY)
Admission: RE | Admit: 2021-06-10 | Discharge: 2021-06-10 | Disposition: A | Discharge status: Home / Self Care | Payer: Medicare PPO | Source: Ambulatory Visit | Attending: Physician Assistant | Admitting: Physician Assistant

## 2021-06-10 ENCOUNTER — Other Ambulatory Visit: Payer: Self-pay

## 2021-06-10 ENCOUNTER — Other Ambulatory Visit (HOSPITAL_COMMUNITY): Payer: Self-pay

## 2021-06-10 ENCOUNTER — Encounter (HOSPITAL_COMMUNITY): Payer: Self-pay | Admitting: Physician Assistant

## 2021-06-10 VITALS — BP 138/98 | HR 85 | Ht 66.0 in | Wt 223.4 lb

## 2021-06-10 DIAGNOSIS — R0683 Snoring: Secondary | ICD-10-CM | POA: Diagnosis not present

## 2021-06-10 DIAGNOSIS — Z79899 Other long term (current) drug therapy: Secondary | ICD-10-CM | POA: Diagnosis not present

## 2021-06-10 DIAGNOSIS — Z7901 Long term (current) use of anticoagulants: Secondary | ICD-10-CM | POA: Insufficient documentation

## 2021-06-10 DIAGNOSIS — Z8249 Family history of ischemic heart disease and other diseases of the circulatory system: Secondary | ICD-10-CM | POA: Diagnosis not present

## 2021-06-10 DIAGNOSIS — E785 Hyperlipidemia, unspecified: Secondary | ICD-10-CM | POA: Insufficient documentation

## 2021-06-10 DIAGNOSIS — G4710 Hypersomnia, unspecified: Secondary | ICD-10-CM | POA: Diagnosis not present

## 2021-06-10 DIAGNOSIS — I444 Left anterior fascicular block: Secondary | ICD-10-CM | POA: Diagnosis not present

## 2021-06-10 DIAGNOSIS — Z6836 Body mass index (BMI) 36.0-36.9, adult: Secondary | ICD-10-CM | POA: Diagnosis not present

## 2021-06-10 DIAGNOSIS — R9431 Abnormal electrocardiogram [ECG] [EKG]: Secondary | ICD-10-CM | POA: Insufficient documentation

## 2021-06-10 DIAGNOSIS — Z8673 Personal history of transient ischemic attack (TIA), and cerebral infarction without residual deficits: Secondary | ICD-10-CM | POA: Diagnosis not present

## 2021-06-10 DIAGNOSIS — Z85528 Personal history of other malignant neoplasm of kidney: Secondary | ICD-10-CM | POA: Insufficient documentation

## 2021-06-10 DIAGNOSIS — I10 Essential (primary) hypertension: Secondary | ICD-10-CM | POA: Diagnosis not present

## 2021-06-10 DIAGNOSIS — I480 Paroxysmal atrial fibrillation: Secondary | ICD-10-CM | POA: Diagnosis present

## 2021-06-10 DIAGNOSIS — D6869 Other thrombophilia: Secondary | ICD-10-CM | POA: Insufficient documentation

## 2021-06-10 DIAGNOSIS — E669 Obesity, unspecified: Secondary | ICD-10-CM | POA: Diagnosis not present

## 2021-06-10 DIAGNOSIS — Z823 Family history of stroke: Secondary | ICD-10-CM | POA: Diagnosis not present

## 2021-06-10 DIAGNOSIS — E119 Type 2 diabetes mellitus without complications: Secondary | ICD-10-CM | POA: Diagnosis not present

## 2021-06-10 MED ORDER — APIXABAN 5 MG PO TABS
5.0000 mg | ORAL_TABLET | Freq: Two times a day (BID) | ORAL | 3 refills | Status: DC
  Filled 2021-06-10: qty 60, 30d supply, fill #0
  Filled 2021-07-07: qty 60, 30d supply, fill #1
  Filled 2021-08-04: qty 60, 30d supply, fill #2
  Filled 2021-08-31: qty 60, 30d supply, fill #3

## 2021-06-10 NOTE — Patient Instructions (Signed)
Stop plavix   Start Eliquis 5mg twice a day 

## 2021-06-15 ENCOUNTER — Other Ambulatory Visit (HOSPITAL_COMMUNITY): Payer: Self-pay

## 2021-06-16 ENCOUNTER — Other Ambulatory Visit (HOSPITAL_COMMUNITY): Payer: Self-pay

## 2021-06-17 ENCOUNTER — Other Ambulatory Visit (HOSPITAL_COMMUNITY): Payer: Self-pay

## 2021-06-17 MED ORDER — SPIRONOLACTONE 25 MG PO TABS
ORAL_TABLET | ORAL | 1 refills | Status: DC
  Filled 2021-06-17: qty 60, 30d supply, fill #0
  Filled 2021-08-17: qty 60, 30d supply, fill #1

## 2021-06-17 MED ORDER — LISINOPRIL 10 MG PO TABS
ORAL_TABLET | ORAL | 1 refills | Status: DC
  Filled 2021-06-17: qty 30, 30d supply, fill #0
  Filled 2021-07-17: qty 30, 30d supply, fill #1

## 2021-06-18 ENCOUNTER — Other Ambulatory Visit (HOSPITAL_COMMUNITY): Payer: Self-pay

## 2021-06-22 ENCOUNTER — Ambulatory Visit (INDEPENDENT_AMBULATORY_CARE_PROVIDER_SITE_OTHER): Payer: Medicare PPO

## 2021-06-22 DIAGNOSIS — I480 Paroxysmal atrial fibrillation: Secondary | ICD-10-CM

## 2021-06-23 ENCOUNTER — Other Ambulatory Visit (HOSPITAL_COMMUNITY): Payer: Self-pay

## 2021-06-23 LAB — CUP PACEART REMOTE DEVICE CHECK
Date Time Interrogation Session: 20230326230502
Implantable Pulse Generator Implant Date: 20220307

## 2021-06-24 ENCOUNTER — Other Ambulatory Visit (HOSPITAL_COMMUNITY): Payer: Self-pay

## 2021-06-24 MED ORDER — OZEMPIC (0.25 OR 0.5 MG/DOSE) 2 MG/3ML ~~LOC~~ SOPN
PEN_INJECTOR | SUBCUTANEOUS | 1 refills | Status: DC
Start: 2021-06-24 — End: 2021-08-31
  Filled 2021-06-24: qty 3, 28d supply, fill #0
  Filled 2021-07-25: qty 3, 28d supply, fill #1

## 2021-06-29 NOTE — Progress Notes (Deleted)
? ? ?Office Visit  ?  ?Patient Name: [name] ?Date of Encounter: 06/29/2021 ? ?Primary Care Provider:  Lin Landsman, MD ?Primary Cardiologist:  None ?Primary Electrophysiologist: None ?Chief Complaint  ?  ?6-week follow-up for atrial fibrillation ? ? Patient Profile: ?DM ?HLD ?HTN ?Renal CA s/p right renal cryoablation ?TIA ?Atrial fibrillation ?Linq recorder ?Asthma ? ? Recent Studies: ?09/2019 TTE: EF 60-65%, normal LV function, mild concentric LVH, no RWMA ?09/2020 TTE: EF 60-65%, no significant changes from previous study ?04/2018 Carotid US: ?Linq recorder: Implanted 05/2020 ? ? ?History of Present Illness  ?  ?[name] is a 61 y.o. female with PMH of DM, HLD, HTN, TIA, atrial fibrillation, with ILR.  Patient was recently seen in AF clinic by Adline Peals, PA due to recent diagnosis of AF picked up on ILR. CHA2DS2-VASc score is 5, and was started on 5 mg Eliquis and Plavix was discontinued.  She was also provided referral to sleep study. ? ?Since {Blank single:19197::"last being seen in our clinic","discharge from hospital"} the patient reports doing ***.  '@He'$ /She@ denies chest pain, palpitations, dyspnea, PND, orthopnea, nausea, vomiting, dizziness, syncope, edema, weight gain, or early satiety. ? ?Notes: ?-Eliquis and bleeding issues with compliance ?-Seen by Audry Pili on 3/15 ?-Sleep Study ? ?Past Medical History  ?  ?Past Medical History:  ?Diagnosis Date  ? Anemia   ? hx of   ? Arthritis   ? " in my ankle "  ? Asthma   ? due to allergies   ? Cancer [facility])   ? THRYOID 2016 [place] WITH SURGERY, KIDNEY CANCER MAY 2018  RIGHT RENAL CRYOABLATION  ? Complication of anesthesia   ? Diabetes mellitus   ? TYPE 2  ? Family history of adverse reaction to anesthesia   ? difficult for father to wake after surgery, AFTHER LOW BP WITH ANESTHESIA  ? HOH (hard of hearing)   ? Hypercholesteremia   ? Hypertension   ? PONV (postoperative nausea and vomiting)   ? Renal cancer, right ([place]) dx'd 07/2018, 10/2018  ? ablation x 2    ? Thyroid disease   ? TIA (transient ischemic attack)   ? LAST TIA  APRIL 2020 ON PLAVIX, reports residual effects ,02/2020  ? ?Past Surgical History:  ?Procedure Laterality Date  ? ABDOMINAL HYSTERECTOMY    ? COMPLETE  ? BIOPSY  03/02/2019  ? Procedure: BIOPSY;  Surgeon: Carol Ada, MD;  Location: [facility];  Service: Endoscopy;;  ? CHOLECYSTECTOMY    ? COLON SURGERY    ? COLONOSCOPY WITH PROPOFOL N/A 03/02/2019  ? Procedure: COLONOSCOPY WITH PROPOFOL;  Surgeon: Carol Ada, MD;  Location: [facility];  Service: Endoscopy;  Laterality: N/A;  ? ear surgeries     ? implantable loop recorder placement  06/02/2020  ? Medtronic Reveal Linq model [id number] implantable loop recorder (SN [id number])   ? IR RADIOLOGIST EVAL & MGMT  01/12/2017  ? IR RADIOLOGIST EVAL & MGMT  06/16/2017  ? IR RADIOLOGIST EVAL & MGMT  08/31/2017  ? IR RADIOLOGIST EVAL & MGMT  12/13/2017  ? IR RADIOLOGIST EVAL & MGMT  03/16/2018  ? IR RADIOLOGIST EVAL & MGMT  11/01/2018  ? IR RADIOLOGIST EVAL & MGMT  12/13/2018  ? IR RADIOLOGIST EVAL & MGMT  02/15/2019  ? IR RADIOLOGIST EVAL & MGMT  08/16/2019  ? IR RADIOLOGIST EVAL & MGMT  11/22/2019  ? IR RADIOLOGIST EVAL & MGMT  06/05/2020  ? IR RADIOLOGIST EVAL & MGMT  11/27/2020  ? KNEE  ARTHROSCOPY    ? LEFT HEART CATHETERIZATION WITH CORONARY ANGIOGRAM N/A 04/24/2014  ? Procedure: LEFT HEART CATHETERIZATION WITH CORONARY ANGIOGRAM;  Surgeon: Burnell Blanks, MD;  Location: [facility] CATH LAB;  Service: Cardiovascular;  Laterality: N/A;  ? POLYPECTOMY  03/02/2019  ? Procedure: POLYPECTOMY;  Surgeon: Carol Ada, MD;  Location: [facility] ENDOS[facility];  Service: Endoscopy;;  ? RADIOFREQUENCY ABLATION Right 08/05/2017  ? Procedure: CT RENAL CRYO AND BIOPSY;  Surgeon: Sandi Mariscal, MD;  Location: [facility] ORS;  Service: Anesthesiology;  Laterality: Right;  ? RADIOFREQUENCY ABLATION Right 11/15/2018  ? Procedure: RIGHT RENAL CRYOABLATION;  Surgeon: Sandi Mariscal, MD;  Location: [facility];  Service: Anesthesiology;  Laterality: Right;  ?  THYROIDECTOMY N/A 09/20/2014  ? Procedure: TOTAL THYROIDECTOMY;  Surgeon: Armandina Gemma, MD;  Location: [facility] ORS;  Service: General;  Laterality: N/A;  ? TONSILLECTOMY    ? ? ?Allergies ? ?Allergies  ?Allergen Reactions  ? Amoxicillin Other (See Comments) and Anaphylaxis  ?  Resulted in ambulance per pt  ? Lorabid [Loracarbef] Anaphylaxis  ? Augmentin [Amoxicillin-Pot Clavulanate] Hives, Nausea And Vomiting and Other (See Comments)  ?  Chest pain  ? Codeine Nausea And Vomiting  ? Furosemide Swelling  ?  Patient states has reaction based on sulfa allergy  ? Gadolinium Derivatives Itching, Swelling and Other (See Comments)  ?  Patient will require 13 hr prep prior to administration of gadolinium    ? Sulfa Drugs Cross Reactors Other (See Comments)  ?  Severe allergic reaction as a child - was not told symptoms... states she has had sulfa reaction to furosemide  ? Benicar [Olmesartan Medoxomil] Swelling and Rash  ? Other Rash  ?  Green peas  ? ? ?Home Medications  ?  ?Current Outpatient Medications  ?Medication Sig Dispense Refill  ? acetaminophen (TYLENOL) 500 MG tablet Take 2 tablets (1,000 mg total) by mouth every 6 (six) hours as needed (body aches). 30 tablet 0  ? albuterol (VENTOLIN HFA) 108 (90 Base) MCG/ACT inhaler Inhale 2 puffs every 4 to 6 hours as needed. 18 g 2  ? apixaban (ELIQUIS) 5 MG TABS tablet Take 1 tablet (5 mg total) by mouth 2 (two) times daily. 60 tablet 3  ? atorvastatin (LIPITOR) 40 MG tablet Take 1 tablet by mouth at bedtime. 30 tablet 3  ? CALCIUM-MAGNESIUM-ZINC PO Take 2 tablets by mouth 2 (two) times daily.    ? diclofenac Sodium (VOLTAREN) 1 % GEL Apply to affected area(s) twice daily as directed 100 g 0  ? diphenhydrAMINE (BENADRYL) 25 MG tablet Take 25 mg by mouth at bedtime.    ? fluticasone (FLONASE) 50 MCG/ACT nasal spray Place 2 sprays into both nostrils daily.    ? ibuprofen (ADVIL) 200 MG tablet Take 800 mg by mouth every 8 (eight) hours as needed for headache or moderate pain.    ?  levothyroxine (SYNTHROID) 200 MCG tablet Take 1 tablet by mouth daily. 90 tablet 3  ? lisinopril (ZESTRIL) 10 MG tablet Take 1 tablet by mouth once a day 30 tablet 1  ? metFORMIN (GLUCOPHAGE) 500 MG tablet Take 1 tablet (500 mg total) by mouth 2 (two) times daily with a meal. 60 tablet 11  ? metoprolol tartrate (LOPRESSOR) 100 MG tablet Take 1 tablet (100 mg total) by mouth 2 (two) times daily. 60 tablet 0  ? mupirocin ointment (BACTROBAN) 2 % Apply 1 application topically in the morning and at bedtime. Mole removal site on back    ? ondansetron (ZOFRAN) 8 MG  tablet TAKE 1 TABLET BY MOUTH 3 TIMES DAILY 20 tablet 4  ? pantoprazole (PROTONIX) 40 MG tablet Take 1 tablet (40 mg total) by mouth daily. 90 tablet 3  ? Probiotic Product (PROBIOTIC PO) Take 1 tablet by mouth daily.    ? Semaglutide,0.25 or 0.'5MG'$ /DOS, (OZEMPIC, 0.25 OR 0.5 MG/DOSE,) 2 MG/1.5ML SOPN Inject 0.5 mg subcutaneously once a week. 1.5 mL 1  ? Semaglutide,0.25 or 0.'5MG'$ /DOS, (OZEMPIC, 0.25 OR 0.5 MG/DOSE,) 2 MG/3ML SOPN Inject 0.5 mg into the skin once a week 3 mL 1  ? spironolactone (ALDACTONE) 25 MG tablet Take 1 tablet by mouth 2 times a day 60 tablet 1  ? Torsemide 40 MG TABS Take 1 tablet by mouth in the morning and at bedtime for 10 days. 20 tablet 0  ? zinc gluconate 50 MG tablet Take 50 mg by mouth daily as needed (flu season).    ? ?No current facility-administered medications for this visit.  ?  ? ?Review of Systems  ?Please see the history of present illness.    ?(+)*** ?(+)*** ? ?All other systems reviewed and are otherwise negative except as noted above. ? ?Physical Exam  ?  ?Wt Readings from Last 3 Encounters:  ?06/10/21 223 lb 6.4 oz (101.3 kg)  ?04/15/21 218 lb (98.9 kg)  ?03/25/21 231 lb 7.7 oz (105 kg)  ? ?FU:XNATF were no vitals filed for this visit.,There is no height or weight on file to calculate BMI. ? ?Constitutional:   ?   Appearance: Healthy appearance. Not in distress.  ?Neck:  ?   Vascular: JVD normal.  ?Pulmonary:  ?    Effort: Pulmonary effort is normal.  ?   Breath sounds: No wheezing. No rales.  ?Cardiovascular:  ?   Normal rate. Regular rhythm. Normal S1. Normal S2.   ?   Murmurs: There is no murmur.  ?Edema: ?   Peripheral e

## 2021-07-01 ENCOUNTER — Telehealth: Payer: Self-pay | Admitting: *Deleted

## 2021-07-01 ENCOUNTER — Ambulatory Visit (HOSPITAL_BASED_OUTPATIENT_CLINIC_OR_DEPARTMENT_OTHER): Payer: Medicare PPO | Admitting: Nurse Practitioner

## 2021-07-01 DIAGNOSIS — I480 Paroxysmal atrial fibrillation: Secondary | ICD-10-CM

## 2021-07-01 DIAGNOSIS — E7800 Pure hypercholesterolemia, unspecified: Secondary | ICD-10-CM

## 2021-07-01 DIAGNOSIS — E6609 Other obesity due to excess calories: Secondary | ICD-10-CM

## 2021-07-01 DIAGNOSIS — I10 Essential (primary) hypertension: Secondary | ICD-10-CM

## 2021-07-01 DIAGNOSIS — I5032 Chronic diastolic (congestive) heart failure: Secondary | ICD-10-CM

## 2021-07-01 NOTE — Telephone Encounter (Signed)
APPROVED TURNER READ ?Jul 26 2021 - Aug 25 2021 ?AUTHORIZATION NUMBER [id number] ?

## 2021-07-02 NOTE — Progress Notes (Signed)
Carelink Summary Report / Loop Recorder 

## 2021-07-06 ENCOUNTER — Other Ambulatory Visit (HOSPITAL_COMMUNITY): Payer: Self-pay

## 2021-07-07 ENCOUNTER — Other Ambulatory Visit: Payer: Self-pay | Admitting: *Deleted

## 2021-07-07 ENCOUNTER — Other Ambulatory Visit (HOSPITAL_COMMUNITY): Payer: Self-pay

## 2021-07-07 ENCOUNTER — Other Ambulatory Visit (HOSPITAL_COMMUNITY): Payer: Self-pay | Admitting: *Deleted

## 2021-07-07 MED ORDER — METOPROLOL TARTRATE 100 MG PO TABS
100.0000 mg | ORAL_TABLET | Freq: Two times a day (BID) | ORAL | 0 refills | Status: DC
  Filled 2021-07-07: qty 60, 30d supply, fill #0

## 2021-07-07 MED ORDER — METOPROLOL TARTRATE 100 MG PO TABS
100.0000 mg | ORAL_TABLET | Freq: Two times a day (BID) | ORAL | 3 refills | Status: DC
  Filled 2021-07-07: qty 60, 30d supply, fill #0
  Filled 2021-08-04: qty 60, 30d supply, fill #1
  Filled 2021-08-31: qty 60, 30d supply, fill #2
  Filled 2021-10-01: qty 60, 30d supply, fill #3

## 2021-07-10 ENCOUNTER — Other Ambulatory Visit (HOSPITAL_COMMUNITY): Payer: Self-pay

## 2021-07-13 NOTE — Progress Notes (Signed)
? ? ?Primary Care Physician: Lin Landsman, MD ?Primary Cardiologist: none ?Primary Electrophysiologist: Dr Rayann Heman ?Referring Physician: Dr Rayann Heman ? ? ?[name] is a 61 y.o. female with a history of DM, HLD, HTN, renal cancer s/p right renal cryoablation, TIA, atrial fibrillation who presents for follow up in the [facility].  The patient was initially diagnosed with atrial fibrillation 06/07/21 on her ILR which was placed 05/2020 for TIA. Patient has for a CHADS2VASC score of 5. Patient does report that she felt brief palpitations at the time of her afib. No other associated symptoms. She denies alcohol use but she does admit to snoring and daytime somnolence.  ? ?On follow up today, patient reports she has done well since her last visit. She did have some lightheadedness for a couple days starting Eliquis but this has resolved. No bleeding issues. ILR shows no interim episodes of afib.  ? ?Today, she denies symptoms of palpitations, chest pain, shortness of breath, orthopnea, PND, lower extremity edema, dizziness, presyncope, syncope, bleeding, or neurologic sequela. The patient is tolerating medications without difficulties and is otherwise without complaint today.  ? ? ?Atrial Fibrillation Risk Factors: ? ?she does have symptoms or diagnosis of sleep apnea. ?she is agreeable for sleep study.  ?she does not have a history of rheumatic fever. ?she does not have a history of alcohol use. ?The patient does not have a history of early familial atrial fibrillation or other arrhythmias. ? ?she has a BMI of Body mass index is 37.16 kg/m?Marland KitchenMarland Kitchen ?Filed Weights  ? 07/14/21 0830  ?Weight: 104.4 kg  ? ? ? ?Family History  ?Problem Relation Age of Onset  ? Heart failure Mother   ? Diabetes Mother   ? Stroke Mother   ? Stroke Father   ? Heart failure Father   ? Cancer Father   ? ? ? ?Atrial Fibrillation Management history: ? ?Previous antiarrhythmic drugs: none ?Previous cardioversions:  none ?Previous ablations: none ?CHADS2VASC score: 5 ?Anticoagulation history: Eliquis ? ? ?Past Medical History:  ?Diagnosis Date  ? Anemia   ? hx of   ? Arthritis   ? " in my ankle "  ? Asthma   ? due to allergies   ? Cancer [facility])   ? THRYOID 2016 [place] WITH SURGERY, KIDNEY CANCER MAY 2018  RIGHT RENAL CRYOABLATION  ? Complication of anesthesia   ? Diabetes mellitus   ? TYPE 2  ? Family history of adverse reaction to anesthesia   ? difficult for father to wake after surgery, AFTHER LOW BP WITH ANESTHESIA  ? HOH (hard of hearing)   ? Hypercholesteremia   ? Hypertension   ? PONV (postoperative nausea and vomiting)   ? Renal cancer, right ([place]) dx'd 07/2018, 10/2018  ? ablation x 2   ? Thyroid disease   ? TIA (transient ischemic attack)   ? LAST TIA  APRIL 2020 ON PLAVIX, reports residual effects ,02/2020  ? ?Past Surgical History:  ?Procedure Laterality Date  ? ABDOMINAL HYSTERECTOMY    ? COMPLETE  ? BIOPSY  03/02/2019  ? Procedure: BIOPSY;  Surgeon: Carol Ada, MD;  Location: [facility];  Service: Endoscopy;;  ? CHOLECYSTECTOMY    ? COLON SURGERY    ? COLONOSCOPY WITH PROPOFOL N/A 03/02/2019  ? Procedure: COLONOSCOPY WITH PROPOFOL;  Surgeon: Carol Ada, MD;  Location: [facility];  Service: Endoscopy;  Laterality: N/A;  ? ear surgeries     ? implantable loop recorder placement  06/02/2020  ? Medtronic Reveal  Linq model [id number] implantable loop recorder (SN [id number])   ? IR RADIOLOGIST EVAL & MGMT  01/12/2017  ? IR RADIOLOGIST EVAL & MGMT  06/16/2017  ? IR RADIOLOGIST EVAL & MGMT  08/31/2017  ? IR RADIOLOGIST EVAL & MGMT  12/13/2017  ? IR RADIOLOGIST EVAL & MGMT  03/16/2018  ? IR RADIOLOGIST EVAL & MGMT  11/01/2018  ? IR RADIOLOGIST EVAL & MGMT  12/13/2018  ? IR RADIOLOGIST EVAL & MGMT  02/15/2019  ? IR RADIOLOGIST EVAL & MGMT  08/16/2019  ? IR RADIOLOGIST EVAL & MGMT  11/22/2019  ? IR RADIOLOGIST EVAL & MGMT  06/05/2020  ? IR RADIOLOGIST EVAL & MGMT  11/27/2020  ? KNEE ARTHROSCOPY    ? LEFT HEART CATHETERIZATION WITH CORONARY  ANGIOGRAM N/A 04/24/2014  ? Procedure: LEFT HEART CATHETERIZATION WITH CORONARY ANGIOGRAM;  Surgeon: Burnell Blanks, MD;  Location: [facility] CATH LAB;  Service: Cardiovascular;  Laterality: N/A;  ? POLYPECTOMY  03/02/2019  ? Procedure: POLYPECTOMY;  Surgeon: Carol Ada, MD;  Location: [facility];  Service: Endoscopy;;  ? RADIOFREQUENCY ABLATION Right 08/05/2017  ? Procedure: CT RENAL CRYO AND BIOPSY;  Surgeon: Sandi Mariscal, MD;  Location: [facility];  Service: Anesthesiology;  Laterality: Right;  ? RADIOFREQUENCY ABLATION Right 11/15/2018  ? Procedure: RIGHT RENAL CRYOABLATION;  Surgeon: Sandi Mariscal, MD;  Location: [facility];  Service: Anesthesiology;  Laterality: Right;  ? THYROIDECTOMY N/A 09/20/2014  ? Procedure: TOTAL THYROIDECTOMY;  Surgeon: Armandina Gemma, MD;  Location: [facility];  Service: General;  Laterality: N/A;  ? TONSILLECTOMY    ? ? ?Current Outpatient Medications  ?Medication Sig Dispense Refill  ? acetaminophen (TYLENOL) 500 MG tablet Take 2 tablets (1,000 mg total) by mouth every 6 (six) hours as needed (body aches). 30 tablet 0  ? albuterol (VENTOLIN HFA) 108 (90 Base) MCG/ACT inhaler Inhale 2 puffs every 4 to 6 hours as needed. 18 g 2  ? apixaban (ELIQUIS) 5 MG TABS tablet Take 1 tablet (5 mg total) by mouth 2 (two) times daily. 60 tablet 3  ? atorvastatin (LIPITOR) 40 MG tablet Take 1 tablet by mouth at bedtime. 30 tablet 3  ? CALCIUM-MAGNESIUM-ZINC PO Take 2 tablets by mouth 2 (two) times daily.    ? diclofenac Sodium (VOLTAREN) 1 % GEL Apply to affected area(s) twice daily as directed 100 g 0  ? diphenhydrAMINE (BENADRYL) 25 MG tablet Take 25 mg by mouth at bedtime.    ? fluticasone (FLONASE) 50 MCG/ACT nasal spray Place 2 sprays into both nostrils daily.    ? ibuprofen (ADVIL) 200 MG tablet Take 800 mg by mouth every 8 (eight) hours as needed for headache or moderate pain.    ? levothyroxine (SYNTHROID) 200 MCG tablet Take 1 tablet by mouth daily. 90 tablet 3  ? lisinopril (ZESTRIL) 10 MG tablet Take 1  tablet by mouth once a day 30 tablet 1  ? metFORMIN (GLUCOPHAGE) 500 MG tablet Take 1 tablet (500 mg total) by mouth 2 (two) times daily with a meal. 60 tablet 11  ? metoprolol tartrate (LOPRESSOR) 100 MG tablet Take 1 tablet (100 mg total) by mouth 2 (two) times daily. 60 tablet 3  ? mupirocin ointment (BACTROBAN) 2 % Apply 1 application topically in the morning and at bedtime. Mole removal site on back    ? ondansetron (ZOFRAN) 8 MG tablet TAKE 1 TABLET BY MOUTH 3 TIMES DAILY 20 tablet 4  ? pantoprazole (PROTONIX) 40 MG tablet Take 1 tablet (40 mg total) by mouth daily.  90 tablet 3  ? Probiotic Product (PROBIOTIC PO) Take 1 tablet by mouth daily.    ? Semaglutide,0.25 or 0.'5MG'$ /DOS, (OZEMPIC, 0.25 OR 0.5 MG/DOSE,) 2 MG/1.5ML SOPN Inject 0.5 mg subcutaneously once a week. 1.5 mL 1  ? Semaglutide,0.25 or 0.'5MG'$ /DOS, (OZEMPIC, 0.25 OR 0.5 MG/DOSE,) 2 MG/3ML SOPN Inject 0.5 mg into the skin once a week 3 mL 1  ? spironolactone (ALDACTONE) 25 MG tablet Take 1 tablet by mouth 2 times a day 60 tablet 1  ? zinc gluconate 50 MG tablet Take 50 mg by mouth daily as needed (flu season).    ? Torsemide 40 MG TABS Take 1 tablet by mouth in the morning and at bedtime for 10 days. 20 tablet 0  ? ?No current facility-administered medications for this encounter.  ? ? ?Allergies  ?Allergen Reactions  ? Amoxicillin Other (See Comments) and Anaphylaxis  ?  Resulted in ambulance per pt  ? Lorabid [Loracarbef] Anaphylaxis  ? Augmentin [Amoxicillin-Pot Clavulanate] Hives, Nausea And Vomiting and Other (See Comments)  ?  Chest pain  ? Codeine Nausea And Vomiting  ? Furosemide Swelling  ?  Patient states has reaction based on sulfa allergy  ? Gadolinium Derivatives Itching, Swelling and Other (See Comments)  ?  Patient will require 13 hr prep prior to administration of gadolinium    ? Sulfa Drugs Cross Reactors Other (See Comments)  ?  Severe allergic reaction as a child - was not told symptoms... states she has had sulfa reaction to  furosemide  ? Benicar [Olmesartan Medoxomil] Swelling and Rash  ? Other Rash  ?  Green peas  ? ? ?Social History  ? ?Socioeconomic History  ? Marital status: Married  ?  Spouse name: Glendell Docker  ? Number of children: 2  ? [place]

## 2021-07-14 ENCOUNTER — Encounter (HOSPITAL_COMMUNITY): Payer: Self-pay | Admitting: Physician Assistant

## 2021-07-14 ENCOUNTER — Ambulatory Visit (HOSPITAL_COMMUNITY)
Admission: RE | Admit: 2021-07-14 | Discharge: 2021-07-14 | Disposition: A | Discharge status: Home / Self Care | Payer: Medicare PPO | Source: Ambulatory Visit | Attending: Physician Assistant | Admitting: Physician Assistant

## 2021-07-14 VITALS — BP 124/90 | HR 80 | Ht 66.0 in | Wt 230.2 lb

## 2021-07-14 DIAGNOSIS — Z8673 Personal history of transient ischemic attack (TIA), and cerebral infarction without residual deficits: Secondary | ICD-10-CM | POA: Diagnosis not present

## 2021-07-14 DIAGNOSIS — Z85528 Personal history of other malignant neoplasm of kidney: Secondary | ICD-10-CM | POA: Diagnosis not present

## 2021-07-14 DIAGNOSIS — R0683 Snoring: Secondary | ICD-10-CM | POA: Insufficient documentation

## 2021-07-14 DIAGNOSIS — E119 Type 2 diabetes mellitus without complications: Secondary | ICD-10-CM | POA: Insufficient documentation

## 2021-07-14 DIAGNOSIS — Z6837 Body mass index (BMI) 37.0-37.9, adult: Secondary | ICD-10-CM | POA: Insufficient documentation

## 2021-07-14 DIAGNOSIS — Z79899 Other long term (current) drug therapy: Secondary | ICD-10-CM | POA: Insufficient documentation

## 2021-07-14 DIAGNOSIS — I10 Essential (primary) hypertension: Secondary | ICD-10-CM | POA: Diagnosis not present

## 2021-07-14 DIAGNOSIS — I480 Paroxysmal atrial fibrillation: Secondary | ICD-10-CM

## 2021-07-14 DIAGNOSIS — Z7901 Long term (current) use of anticoagulants: Secondary | ICD-10-CM | POA: Diagnosis not present

## 2021-07-14 DIAGNOSIS — D6869 Other thrombophilia: Secondary | ICD-10-CM

## 2021-07-14 DIAGNOSIS — E669 Obesity, unspecified: Secondary | ICD-10-CM | POA: Diagnosis not present

## 2021-07-14 DIAGNOSIS — E785 Hyperlipidemia, unspecified: Secondary | ICD-10-CM | POA: Insufficient documentation

## 2021-07-14 LAB — CBC
HCT: 36.3 % (ref 36.0–46.0)
Hemoglobin: 11.5 g/dL — ABNORMAL LOW (ref 12.0–15.0)
MCH: 29.9 pg (ref 26.0–34.0)
MCHC: 31.7 g/dL (ref 30.0–36.0)
MCV: 94.3 fL (ref 80.0–100.0)
Platelets: 269 10*3/uL (ref 150–400)
RBC: 3.85 MIL/uL — ABNORMAL LOW (ref 3.87–5.11)
RDW: 13.2 % (ref 11.5–15.5)
WBC: 7.2 10*3/uL (ref 4.0–10.5)
nRBC: 0 % (ref 0.0–0.2)

## 2021-07-14 LAB — BASIC METABOLIC PANEL
Anion gap: 8 (ref 5–15)
BUN: 20 mg/dL (ref 6–20)
CO2: 19 mmol/L — ABNORMAL LOW (ref 22–32)
Calcium: 8.5 mg/dL — ABNORMAL LOW (ref 8.9–10.3)
Chloride: 111 mmol/L (ref 98–111)
Creatinine, Ser: 1.05 mg/dL — ABNORMAL HIGH (ref 0.44–1.00)
GFR, Estimated: >60 mL/min (ref >60)
Glucose, Bld: 188 mg/dL — ABNORMAL HIGH (ref 70–99)
Potassium: 4.3 mmol/L (ref 3.5–5.1)
Sodium: 138 mmol/L (ref 135–145)

## 2021-07-16 ENCOUNTER — Ambulatory Visit: Payer: Medicare PPO | Admitting: Internal Medicine

## 2021-07-17 ENCOUNTER — Encounter (HOSPITAL_COMMUNITY): Payer: Self-pay

## 2021-07-17 ENCOUNTER — Other Ambulatory Visit (HOSPITAL_COMMUNITY): Payer: Self-pay

## 2021-07-18 ENCOUNTER — Other Ambulatory Visit (HOSPITAL_COMMUNITY): Payer: Self-pay

## 2021-07-24 LAB — CUP PACEART REMOTE DEVICE CHECK
Date Time Interrogation Session: 20230428230129
Implantable Pulse Generator Implant Date: 20220307

## 2021-07-25 ENCOUNTER — Other Ambulatory Visit (HOSPITAL_COMMUNITY): Payer: Self-pay

## 2021-07-27 ENCOUNTER — Other Ambulatory Visit (HOSPITAL_COMMUNITY): Payer: Self-pay

## 2021-07-27 ENCOUNTER — Ambulatory Visit (INDEPENDENT_AMBULATORY_CARE_PROVIDER_SITE_OTHER): Payer: Medicare PPO

## 2021-07-27 DIAGNOSIS — G459 Transient cerebral ischemic attack, unspecified: Secondary | ICD-10-CM

## 2021-07-28 ENCOUNTER — Ambulatory Visit: Payer: Medicare PPO | Admitting: Internal Medicine

## 2021-08-04 ENCOUNTER — Other Ambulatory Visit (HOSPITAL_COMMUNITY): Payer: Self-pay

## 2021-08-07 ENCOUNTER — Encounter (HOSPITAL_BASED_OUTPATIENT_CLINIC_OR_DEPARTMENT_OTHER): Payer: Medicare PPO | Admitting: Cardiology

## 2021-08-11 NOTE — Progress Notes (Signed)
Carelink Summary Report / Loop Recorder 

## 2021-08-17 ENCOUNTER — Other Ambulatory Visit (HOSPITAL_COMMUNITY): Payer: Self-pay

## 2021-08-17 MED ORDER — LISINOPRIL 10 MG PO TABS
ORAL_TABLET | ORAL | 1 refills | Status: DC
  Filled 2021-08-17: qty 30, 30d supply, fill #0
  Filled 2021-09-23: qty 30, 30d supply, fill #1

## 2021-08-19 ENCOUNTER — Other Ambulatory Visit (HOSPITAL_COMMUNITY): Payer: Self-pay

## 2021-08-25 ENCOUNTER — Other Ambulatory Visit (HOSPITAL_COMMUNITY): Payer: Self-pay

## 2021-08-26 ENCOUNTER — Ambulatory Visit (INDEPENDENT_AMBULATORY_CARE_PROVIDER_SITE_OTHER): Payer: Medicare PPO | Admitting: Internal Medicine

## 2021-08-26 ENCOUNTER — Other Ambulatory Visit (HOSPITAL_COMMUNITY): Payer: Self-pay

## 2021-08-26 ENCOUNTER — Encounter: Payer: Self-pay | Admitting: Internal Medicine

## 2021-08-26 VITALS — BP 130/100 | HR 73 | Ht 66.0 in | Wt 226.0 lb

## 2021-08-26 DIAGNOSIS — I480 Paroxysmal atrial fibrillation: Secondary | ICD-10-CM | POA: Diagnosis not present

## 2021-08-26 MED ORDER — ATORVASTATIN CALCIUM 80 MG PO TABS
80.0000 mg | ORAL_TABLET | Freq: Every day | ORAL | 1 refills | Status: DC
Start: 2021-08-26 — End: 2022-02-25
  Filled 2021-08-26: qty 90, 90d supply, fill #0
  Filled 2021-11-21: qty 90, 90d supply, fill #1

## 2021-08-26 NOTE — Patient Instructions (Signed)
Medication Instructions:  INCREASE Atorvastatin 80 mg once daily  *If you need a refill on your cardiac medications before your next appointment, please call your pharmacy*   Lab Work: None ordered If you have labs (blood work) drawn today and your tests are completely normal, you will receive your results only by: [place] (if you have MyChart) OR A paper copy in the mail If you have any lab test that is abnormal or we need to change your treatment, we will call you to review the results.   Testing/Procedures: None ordered   Follow-Up: At [facility], you and your health needs are our priority.  As part of our continuing mission to provide you with exceptional heart care, we have created designated Provider Care Teams.  These Care Teams include your primary Cardiologist (physician) and Advanced Practice Providers (APPs -  Physician Assistants and Nurse Practitioners) who all work together to provide you with the care you need, when you need it.  We recommend signing up for the patient portal called "MyChart".  Sign up information is provided on this After Visit Summary.  MyChart is used to connect with patients for Virtual Visits (Telemedicine).  Patients are able to view lab/test results, encounter notes, upcoming appointments, etc.  Non-urgent messages can be sent to your provider as well.   To learn more about what you can do with MyChart, go to [email or web address].    Your next appointment:   6 month(s)  The format for your next appointment:   In Person  Provider:   Dr. Harl Bowie  Other Instructions Dr. Harl Bowie recommends KNEE HIGH COMPRESSION STOCKINGS            -- 20- 30 mmHg (compression strength) -- [facility]             -- [address]             -- [phone]  -- [place]             -- [address] [place]             -- [phone]

## 2021-08-26 NOTE — Progress Notes (Addendum)
Cardiology Office Note:    Date:  08/26/2021   ID:  [name], DOB 07/08/1960, MRN [id number]  PCP:  Lin Landsman, MD   [place] Providers Cardiologist:  None     Referring MD: Oliver Barre, PA   No chief complaint on file. Diastolic CHF  History of Present Illness:    [name] is a 61 y.o. female with a hx of DM, HLD, HTN, renal cancer s/p right renal cryoablation (not on TKI), TIA, thyroid CA s/p total thyroidectomy in 2016 on synthroid, atrial fibrillation CHADS2VASC=6 (stroke, gender, DM2, HTN, CHF), diastolic dysfunction, Seen by Afib clinic in April. She has an ILR in place.  She's on eliquis. She is rate controlled. Planning for a sleep study. She has normal LV function, RV function, no significant valve dx. She's on torsemide, prior lasix allergy (noted she received batch with sulfa, and she has a sulfa allergy). She says her feet swell at the end of the day. She takes torsemide 40 mg daily.  She denies PND or orthopnea.  No dyspnea on exertion. She is mainly at home and she has grandchildren to keep her busy 78,10 and 60 years old.    08/26/2021 Weight 226 pounds Wt Readings from Last 3 Encounters:  08/26/21 226 lb (102.5 kg)  07/14/21 230 lb 3.2 oz (104.4 kg)  06/10/21 223 lb 6.4 oz (101.3 kg)     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 "   Asthma    due to allergies    Cancer ([place])    THRYOID 2016 [place], KIDNEY CANCER MAY 2018  RIGHT RENAL CRYOABLATION   Complication of anesthesia    Diabetes mellitus    TYPE 2   Family history of adverse reaction to anesthesia    difficult for father to wake after surgery, AFTHER LOW BP WITH ANESTHESIA   HOH (hard of hearing)    Hypercholesteremia    Hypertension    PONV (postoperative nausea and vomiting)    Renal cancer, right ([place]) dx'd 07/2018, 10/2018   ablation x 2    Thyroid disease    TIA (transient ischemic attack)    LAST TIA  APRIL 2020  ON PLAVIX, reports residual effects ,02/2020    Past Surgical History:  Procedure Laterality Date   ABDOMINAL HYSTERECTOMY     COMPLETE   BIOPSY  03/02/2019   Procedure: BIOPSY;  Surgeon: Carol Ada, MD;  Location: [facility];  Service: Endoscopy;;   CHOLECYSTECTOMY     COLON SURGERY     COLONOSCOPY WITH PROPOFOL N/A 03/02/2019   Procedure: COLONOSCOPY WITH PROPOFOL;  Surgeon: Carol Ada, MD;  Location: [facility];  Service: Endoscopy;  Laterality: N/A;   ear surgeries      implantable loop recorder placement  06/02/2020   Medtronic Reveal Linq model LNQ22 implantable loop recorder (SN [id number])    IR RADIOLOGIST EVAL & MGMT  01/12/2017   IR RADIOLOGIST EVAL & MGMT  06/16/2017   IR RADIOLOGIST EVAL & MGMT  08/31/2017   IR RADIOLOGIST EVAL & MGMT  12/13/2017   IR RADIOLOGIST EVAL & MGMT  03/16/2018   IR RADIOLOGIST EVAL & MGMT  11/01/2018   IR RADIOLOGIST EVAL & MGMT  12/13/2018   IR RADIOLOGIST EVAL & MGMT  02/15/2019   IR RADIOLOGIST EVAL & MGMT  08/16/2019   IR RADIOLOGIST EVAL & MGMT  11/22/2019  IR RADIOLOGIST EVAL & MGMT  06/05/2020   IR RADIOLOGIST EVAL & MGMT  11/27/2020   KNEE ARTHROSCOPY     LEFT HEART CATHETERIZATION WITH CORONARY ANGIOGRAM N/A 04/24/2014   Procedure: LEFT HEART CATHETERIZATION WITH CORONARY ANGIOGRAM;  Surgeon: Burnell Blanks, MD;  Location: [facility] CATH LAB;  Service: Cardiovascular;  Laterality: N/A;   POLYPECTOMY  03/02/2019   Procedure: POLYPECTOMY;  Surgeon: Carol Ada, MD;  Location: [facility];  Service: Endoscopy;;   RADIOFREQUENCY ABLATION Right 08/05/2017   Procedure: CT RENAL CRYO AND BIOPSY;  Surgeon: Sandi Mariscal, MD;  Location: [facility];  Service: Anesthesiology;  Laterality: Right;   RADIOFREQUENCY ABLATION Right 11/15/2018   Procedure: RIGHT RENAL CRYOABLATION;  Surgeon: Sandi Mariscal, MD;  Location: [facility];  Service: Anesthesiology;  Laterality: Right;   THYROIDECTOMY N/A 09/20/2014   Procedure: TOTAL THYROIDECTOMY;  Surgeon: Armandina Gemma, MD;  Location: [facility];  Service: General;  Laterality: N/A;   TONSILLECTOMY      Current Medications: Current Meds  Medication Sig   acetaminophen (TYLENOL) 500 MG tablet Take 2 tablets (1,000 mg total) by mouth every 6 (six) hours as needed (body aches).   albuterol (VENTOLIN HFA) 108 (90 Base) MCG/ACT inhaler Inhale 2 puffs every 4 to 6 hours as needed.   apixaban (ELIQUIS) 5 MG TABS tablet Take 1 tablet (5 mg total) by mouth 2 (two) times daily.   atorvastatin (LIPITOR) 40 MG tablet Take 1 tablet by mouth at bedtime.   CALCIUM-MAGNESIUM-ZINC PO Take 2 tablets by mouth 2 (two) times daily.   diclofenac Sodium (VOLTAREN) 1 % GEL Apply to affected area(s) twice daily as directed   diphenhydrAMINE (BENADRYL) 25 MG tablet Take 25 mg by mouth at bedtime.   fluticasone (FLONASE) 50 MCG/ACT nasal spray Place 2 sprays into both nostrils daily.   ibuprofen (ADVIL) 200 MG tablet Take 800 mg by mouth every 8 (eight) hours as needed for headache or moderate pain.   levothyroxine (SYNTHROID) 200 MCG tablet Take 1 tablet by mouth daily.   lisinopril (ZESTRIL) 10 MG tablet Take 1 tablet by mouth once a day   metFORMIN (GLUCOPHAGE) 500 MG tablet Take 1 tablet (500 mg total) by mouth 2 (two) times daily with a meal.   metoprolol tartrate (LOPRESSOR) 100 MG tablet Take 1 tablet (100 mg total) by mouth 2 (two) times daily.   mupirocin ointment (BACTROBAN) 2 % Apply 1 application topically in the morning and at bedtime. Mole removal site on back   ondansetron (ZOFRAN) 8 MG tablet TAKE 1 TABLET BY MOUTH 3 TIMES DAILY   pantoprazole (PROTONIX) 40 MG tablet Take 1 tablet (40 mg total) by mouth daily.   Probiotic Product (PROBIOTIC PO) Take 1 tablet by mouth daily.   Semaglutide,0.25 or 0.'5MG'$ /DOS, (OZEMPIC, 0.25 OR 0.5 MG/DOSE,) 2 MG/1.5ML SOPN Inject 0.5 mg subcutaneously once a week.   Semaglutide,0.25 or 0.'5MG'$ /DOS, (OZEMPIC, 0.25 OR 0.5 MG/DOSE,) 2 MG/3ML SOPN Inject 0.5 mg into the skin once a week    spironolactone (ALDACTONE) 25 MG tablet Take 1 tablet by mouth 2 times a day   zinc gluconate 50 MG tablet Take 50 mg by mouth daily as needed (flu season).     Allergies:   Amoxicillin, Lorabid [loracarbef], Augmentin [amoxicillin-pot clavulanate], Codeine, Furosemide, Gadolinium derivatives, Sulfa drugs cross reactors, Benicar [olmesartan medoxomil], and Other   Social History   Socioeconomic History   Marital status: Married    Spouse name: Glendell Docker   Number of children: 2   Years of  education: 14   Highest education level: Not on file  Occupational History    Comment: NA, disabled  Tobacco Use   Smoking status: Never   Smokeless tobacco: Never   Tobacco comments:    Never smoke 07/14/21  Vaping Use   Vaping Use: Never used  Substance and Sexual Activity   Alcohol use: No   Drug use: No   Sexual activity: Not on file  Other Topics Concern   Not on file  Social History Narrative   Lives with husband and son   Social Determinants of Health   Financial Resource Strain: Not on file  Food Insecurity: Not on file  Transportation Needs: Not on file  Physical Activity: Not on file  Stress: Not on file  Social Connections: Not on file     Family History: The patient's family history includes Cancer in her father; Diabetes in her mother; Heart failure in her father and mother; Stroke in her father and mother.  ROS:   Please see the history of present illness.     All other systems reviewed and are negative.  EKGs/Labs/Other Studies Reviewed:    The following studies were reviewed today:   EKG:  EKG is  ordered today.  The ekg ordered today demonstrates   NSR, 1st degree AV block. Septal q  Recent Labs: 10/15/2020: ALT 18 04/05/2021: B Natriuretic Peptide 256.6 04/15/2021: NT-Pro BNP 81 07/14/2021: BUN 20; Creatinine, Ser 1.05; Hemoglobin 11.5; Platelets 269; Potassium 4.3; Sodium 138  Recent Lipid Panel    Component Value Date/Time   CHOL 181 10/15/2020 1025   TRIG  311 (H) 10/15/2020 1025   HDL 38 (L) 10/15/2020 1025   CHOLHDL 4.8 10/15/2020 1025   VLDL 62 (H) 10/15/2020 1025   LDLCALC 81 10/15/2020 1025     Risk Assessment/Calculations:    CHA2DS2-VASc Score = 6   This indicates a 9.7% annual risk of stroke. The patient's score is based upon: CHF History: 1 HTN History: 1 Diabetes History: 1 Stroke History: 2 Vascular Disease History: 0 Age Score: 0 Gender Score: 1          Physical Exam:    VS:  BP (!) 130/100 (BP Location: Left Arm)   Pulse 73   Ht '5\' 6"'$  (1.676 m)   Wt 226 lb (102.5 kg)   SpO2 99%   BMI 36.48 kg/m     Wt Readings from Last 3 Encounters:  08/26/21 226 lb (102.5 kg)  07/14/21 230 lb 3.2 oz (104.4 kg)  06/10/21 223 lb 6.4 oz (101.3 kg)     GEN:  Well nourished, well developed in no acute distress HEENT: Normal NECK: No JVD; No carotid bruits LYMPHATICS: No lymphadenopathy CARDIAC: RRR, no murmurs, rubs, gallops RESPIRATORY:  Clear to auscultation without rales, wheezing or rhonchi  ABDOMEN: Soft, non-tender, non-distended MUSCULOSKELETAL:  No edema; No deformity  SKIN: Warm and dry NEUROLOGIC:  Alert and oriented x 3 PSYCHIATRIC:  Normal affect   ASSESSMENT:    Diastolic CHF:  E/e' 18.  GFR is normal. Crt 1.05. Her dry weight is ~ 226 lbs. She is euvolemic. Will continue torsemide 40 mg daily. Continue spironolactone 25 mg daily.   HLD: with small vessel ischemic dx on brain MRI and high risk will aim for LDL < 70 mg/dL. Increase atorvastatin to 80 mg daily and recheck lipids  Afib: Chads2vasc=6. She's on rate control with metop. Managed by afib clinic.  PLAN:    In order of problems listed above:  Compression stockings  Atorvastatin 80 mg Repeat lipids Follow up in 6 months           Medication Adjustments/Labs and Tests Ordered: Current medicines are reviewed at length with the patient today.  Concerns regarding medicines are outlined above.  No orders of the defined types were  placed in this encounter.  No orders of the defined types were placed in this encounter.   Patient Instructions  Medication Instructions:  INCREASE Atorvastatin 80 mg once daily  *If you need a refill on your cardiac medications before your next appointment, please call your pharmacy*   Lab Work: None ordered If you have labs (blood work) drawn today and your tests are completely normal, you will receive your results only by: [place] (if you have MyChart) OR A paper copy in the mail If you have any lab test that is abnormal or we need to change your treatment, we will call you to review the results.   Testing/Procedures: None ordered   Follow-Up: At [facility], you and your health needs are our priority.  As part of our continuing mission to provide you with exceptional heart care, we have created designated Provider Care Teams.  These Care Teams include your primary Cardiologist (physician) and Advanced Practice Providers (APPs -  Physician Assistants and Nurse Practitioners) who all work together to provide you with the care you need, when you need it.  We recommend signing up for the patient portal called "MyChart".  Sign up information is provided on this After Visit Summary.  MyChart is used to connect with patients for Virtual Visits (Telemedicine).  Patients are able to view lab/test results, encounter notes, upcoming appointments, etc.  Non-urgent messages can be sent to your provider as well.   To learn more about what you can do with MyChart, go to [email or web address].    Your next appointment:   6 month(s)  The format for your next appointment:   In Person  Provider:   Dr. Harl Bowie  Other Instructions Dr. Harl Bowie recommends KNEE HIGH COMPRESSION STOCKINGS            -- 20- 30 mmHg (compression strength) -- [facility]             -- [address]             -- [place]              -- [address] [place]             -- [place]         Signed, Janina Mayo, MD  08/26/2021 11:08 AM    [place]

## 2021-08-27 ENCOUNTER — Other Ambulatory Visit (HOSPITAL_COMMUNITY): Payer: Self-pay

## 2021-08-31 ENCOUNTER — Ambulatory Visit (INDEPENDENT_AMBULATORY_CARE_PROVIDER_SITE_OTHER): Payer: Medicare PPO

## 2021-08-31 ENCOUNTER — Other Ambulatory Visit (HOSPITAL_COMMUNITY): Payer: Self-pay

## 2021-08-31 DIAGNOSIS — G459 Transient cerebral ischemic attack, unspecified: Secondary | ICD-10-CM | POA: Diagnosis not present

## 2021-08-31 LAB — CUP PACEART REMOTE DEVICE CHECK
Date Time Interrogation Session: 20230531230214
Implantable Pulse Generator Implant Date: 20220307

## 2021-08-31 MED ORDER — OZEMPIC (0.25 OR 0.5 MG/DOSE) 2 MG/3ML ~~LOC~~ SOPN
PEN_INJECTOR | SUBCUTANEOUS | 1 refills | Status: DC
  Filled 2021-08-31: qty 3, 28d supply, fill #0
  Filled 2021-10-05: qty 3, 28d supply, fill #1

## 2021-09-02 ENCOUNTER — Ambulatory Visit: Payer: Self-pay | Admitting: Adult Health

## 2021-09-08 ENCOUNTER — Other Ambulatory Visit (HOSPITAL_COMMUNITY): Payer: Self-pay

## 2021-09-08 MED ORDER — TOBRAMYCIN-DEXAMETHASONE 0.3-0.1 % OP SUSP
OPHTHALMIC | 0 refills | Status: DC
  Filled 2021-09-08: qty 5, 10d supply, fill #0

## 2021-09-09 ENCOUNTER — Other Ambulatory Visit (HOSPITAL_COMMUNITY): Payer: Self-pay

## 2021-09-09 MED ORDER — LEVOTHYROXINE SODIUM 200 MCG PO TABS
ORAL_TABLET | ORAL | 1 refills | Status: DC
  Filled 2021-09-09: qty 90, 90d supply, fill #0
  Filled 2021-12-22: qty 90, 90d supply, fill #1

## 2021-09-17 NOTE — Progress Notes (Signed)
Carelink Summary Report / Loop Recorder 

## 2021-09-23 ENCOUNTER — Other Ambulatory Visit (HOSPITAL_COMMUNITY): Payer: Self-pay | Admitting: Physician Assistant

## 2021-09-23 ENCOUNTER — Other Ambulatory Visit (HOSPITAL_COMMUNITY): Payer: Self-pay

## 2021-09-23 MED ORDER — APIXABAN 5 MG PO TABS
5.0000 mg | ORAL_TABLET | Freq: Two times a day (BID) | ORAL | 9 refills | Status: DC
  Filled 2021-09-23: qty 60, 30d supply, fill #0
  Filled 2021-10-21: qty 60, 30d supply, fill #1
  Filled 2021-11-21: qty 60, 30d supply, fill #2
  Filled 2021-12-22: qty 60, 30d supply, fill #3
  Filled 2022-01-27: qty 60, 30d supply, fill #4
  Filled 2022-03-04: qty 60, 30d supply, fill #5
  Filled 2022-04-02: qty 60, 30d supply, fill #6
  Filled 2022-05-03: qty 60, 30d supply, fill #7
  Filled 2022-05-31: qty 60, 30d supply, fill #8
  Filled 2022-07-01: qty 60, 30d supply, fill #9

## 2021-10-01 ENCOUNTER — Other Ambulatory Visit (HOSPITAL_COMMUNITY): Payer: Self-pay

## 2021-10-02 ENCOUNTER — Other Ambulatory Visit (HOSPITAL_COMMUNITY): Payer: Self-pay

## 2021-10-02 MED ORDER — SPIRONOLACTONE 25 MG PO TABS
ORAL_TABLET | ORAL | 2 refills | Status: DC
  Filled 2021-10-02: qty 60, 30d supply, fill #0
  Filled 2021-12-22: qty 60, 30d supply, fill #1
  Filled 2022-02-25: qty 60, 30d supply, fill #2

## 2021-10-03 ENCOUNTER — Other Ambulatory Visit (HOSPITAL_COMMUNITY): Payer: Self-pay

## 2021-10-04 LAB — CUP PACEART REMOTE DEVICE CHECK
Date Time Interrogation Session: 20230703230353
Implantable Pulse Generator Implant Date: 20220307

## 2021-10-05 ENCOUNTER — Other Ambulatory Visit (HOSPITAL_COMMUNITY): Payer: Self-pay

## 2021-10-05 ENCOUNTER — Ambulatory Visit (INDEPENDENT_AMBULATORY_CARE_PROVIDER_SITE_OTHER): Payer: Medicare PPO

## 2021-10-05 DIAGNOSIS — G459 Transient cerebral ischemic attack, unspecified: Secondary | ICD-10-CM | POA: Diagnosis not present

## 2021-10-12 ENCOUNTER — Encounter: Payer: Self-pay | Admitting: Interventional Radiology

## 2021-10-12 ENCOUNTER — Other Ambulatory Visit: Payer: Self-pay | Admitting: Interventional Radiology

## 2021-10-12 DIAGNOSIS — N2889 Other specified disorders of kidney and ureter: Secondary | ICD-10-CM

## 2021-10-21 ENCOUNTER — Other Ambulatory Visit (HOSPITAL_COMMUNITY): Payer: Self-pay

## 2021-10-21 ENCOUNTER — Other Ambulatory Visit: Payer: Self-pay

## 2021-10-21 ENCOUNTER — Other Ambulatory Visit: Payer: Self-pay | Admitting: Internal Medicine

## 2021-10-22 ENCOUNTER — Other Ambulatory Visit: Payer: Self-pay

## 2021-10-22 ENCOUNTER — Other Ambulatory Visit (HOSPITAL_COMMUNITY): Payer: Self-pay

## 2021-10-22 MED ORDER — LISINOPRIL 10 MG PO TABS
ORAL_TABLET | ORAL | 1 refills | Status: DC
  Filled 2021-10-22: qty 30, 30d supply, fill #0
  Filled 2021-11-21: qty 30, 30d supply, fill #1

## 2021-10-22 MED ORDER — METFORMIN HCL 500 MG PO TABS
500.0000 mg | ORAL_TABLET | Freq: Two times a day (BID) | ORAL | 2 refills | Status: DC
  Filled 2021-10-22: qty 60, 30d supply, fill #0
  Filled 2021-11-21: qty 60, 30d supply, fill #1
  Filled 2021-12-22: qty 60, 30d supply, fill #2

## 2021-11-04 NOTE — Progress Notes (Signed)
Carelink Summary Report / Loop Recorder 

## 2021-11-05 ENCOUNTER — Other Ambulatory Visit (HOSPITAL_COMMUNITY): Payer: Self-pay

## 2021-11-05 ENCOUNTER — Other Ambulatory Visit (HOSPITAL_COMMUNITY): Payer: Self-pay | Admitting: Physician Assistant

## 2021-11-05 LAB — CUP PACEART REMOTE DEVICE CHECK
Date Time Interrogation Session: 20230805230807
Implantable Pulse Generator Implant Date: 20220307

## 2021-11-05 MED ORDER — METOPROLOL TARTRATE 100 MG PO TABS
100.0000 mg | ORAL_TABLET | Freq: Two times a day (BID) | ORAL | 9 refills | Status: DC
Start: 2021-11-05 — End: 2022-09-28
  Filled 2021-11-05: qty 60, 30d supply, fill #0
  Filled 2022-01-05: qty 60, 30d supply, fill #1
  Filled 2022-02-07: qty 60, 30d supply, fill #2
  Filled 2022-03-04: qty 60, 30d supply, fill #3
  Filled 2022-04-02: qty 60, 30d supply, fill #4
  Filled 2022-05-03: qty 60, 30d supply, fill #5
  Filled 2022-05-31: qty 60, 30d supply, fill #6
  Filled 2022-07-01: qty 60, 30d supply, fill #7
  Filled 2022-08-03: qty 60, 30d supply, fill #8
  Filled 2022-09-01: qty 60, 30d supply, fill #9

## 2021-11-06 ENCOUNTER — Other Ambulatory Visit (HOSPITAL_COMMUNITY): Payer: Self-pay

## 2021-11-06 MED ORDER — OZEMPIC (0.25 OR 0.5 MG/DOSE) 2 MG/3ML ~~LOC~~ SOPN
PEN_INJECTOR | SUBCUTANEOUS | 0 refills | Status: DC
Start: 2021-11-06 — End: 2023-07-26
  Filled 2021-11-06: qty 3, 28d supply, fill #0

## 2021-11-06 MED ORDER — METOPROLOL TARTRATE 100 MG PO TABS
100.0000 mg | ORAL_TABLET | Freq: Two times a day (BID) | ORAL | 0 refills | Status: DC
  Filled 2021-11-06 – 2021-12-04 (×2): qty 60, 30d supply, fill #0

## 2021-11-09 ENCOUNTER — Ambulatory Visit: Payer: Medicare PPO

## 2021-11-09 ENCOUNTER — Other Ambulatory Visit (HOSPITAL_COMMUNITY): Payer: Self-pay

## 2021-11-10 ENCOUNTER — Ambulatory Visit (HOSPITAL_COMMUNITY)
Admission: RE | Admit: 2021-11-10 | Discharge: 2021-11-10 | Disposition: A | Discharge status: Home / Self Care | Payer: Medicare PPO | Source: Ambulatory Visit | Attending: Interventional Radiology | Admitting: Interventional Radiology

## 2021-11-10 ENCOUNTER — Encounter (HOSPITAL_COMMUNITY): Payer: Self-pay

## 2021-11-10 DIAGNOSIS — N2889 Other specified disorders of kidney and ureter: Secondary | ICD-10-CM | POA: Diagnosis present

## 2021-11-10 LAB — POCT I-STAT CREATININE: Creatinine, Ser: 1.1 mg/dL — ABNORMAL HIGH (ref 0.44–1.00)

## 2021-11-10 MED ORDER — SODIUM CHLORIDE (PF) 0.9 % IJ SOLN
INTRAMUSCULAR | Status: AC
  Filled 2021-11-10: qty 50

## 2021-11-10 MED ORDER — IOHEXOL 300 MG/ML  SOLN
100.0000 mL | Freq: Once | INTRAMUSCULAR | Status: AC | PRN
  Administered 2021-11-10: 100 mL via INTRAVENOUS

## 2021-11-13 ENCOUNTER — Other Ambulatory Visit (HOSPITAL_COMMUNITY): Payer: Self-pay

## 2021-11-19 ENCOUNTER — Ambulatory Visit
Admission: RE | Admit: 2021-11-19 | Discharge: 2021-11-19 | Disposition: A | Discharge status: Home / Self Care | Payer: Medicare PPO | Source: Ambulatory Visit | Attending: Interventional Radiology | Admitting: Interventional Radiology

## 2021-11-19 DIAGNOSIS — N2889 Other specified disorders of kidney and ureter: Secondary | ICD-10-CM

## 2021-11-19 HISTORY — PX: IR RADIOLOGIST EVAL & MGMT: IMG5224

## 2021-11-19 NOTE — Progress Notes (Signed)
Patient ID: [name], female   DOB: 11/08/1960, 61 y.o.   MRN: [id number]        Chief Complaint: Post repeat right-sided cryoablation (11/15/2018).   Referring Physician(s): Bell   History of Present Illness:   [name] is a 61 y.o. female is a 61 year old female with past medical history significant for diabetes, hypertension, hyperlipidemia and thyroid cancer who initially underwent image guided right-sided renal cryoablation on 08/05/2017 of 2 right-sided renal lesions (superior pole and posterior interpolar lesions) who was found to have residual disease involving the superior pole lesion for which she underwent a repeat renal cryoablation on 11/15/2018.     She is seen in follow-up consultation via telemedicine following the acquisition of postprocedural CT scan performed 11/10/2021.   She is currently without complaint.  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 "   Asthma    due to allergies    Cancer ([place])    THRYOID 2016 [place], KIDNEY CANCER MAY 2018  RIGHT RENAL CRYOABLATION   Complication of anesthesia    Diabetes mellitus    TYPE 2   Family history of adverse reaction to anesthesia    difficult for father to wake after surgery, AFTHER LOW BP WITH ANESTHESIA   HOH (hard of hearing)    Hypercholesteremia    Hypertension    PONV (postoperative nausea and vomiting)    Renal cancer, right ([place]) dx'd 07/2018, 10/2018   ablation x 2    Thyroid disease    TIA (transient ischemic attack)    LAST TIA  APRIL 2020 ON PLAVIX, reports residual effects ,02/2020    Past Surgical History:  Procedure Laterality Date   ABDOMINAL HYSTERECTOMY     COMPLETE   BIOPSY  03/02/2019   Procedure: BIOPSY;  Surgeon: Carol Ada, MD;  Location: [facility];  Service: Endoscopy;;   CHOLECYSTECTOMY     COLON SURGERY     COLONOSCOPY WITH PROPOFOL N/A 03/02/2019   Procedure: COLONOSCOPY WITH PROPOFOL;  Surgeon: Carol Ada,  MD;  Location: [facility];  Service: Endoscopy;  Laterality: N/A;   ear surgeries      implantable loop recorder placement  06/02/2020   Medtronic Reveal Linq model LNQ22 implantable loop recorder (SN [id number])    IR RADIOLOGIST EVAL & MGMT  01/12/2017   IR RADIOLOGIST EVAL & MGMT  06/16/2017   IR RADIOLOGIST EVAL & MGMT  08/31/2017   IR RADIOLOGIST EVAL & MGMT  12/13/2017   IR RADIOLOGIST EVAL & MGMT  03/16/2018   IR RADIOLOGIST EVAL & MGMT  11/01/2018   IR RADIOLOGIST EVAL & MGMT  12/13/2018   IR RADIOLOGIST EVAL & MGMT  02/15/2019   IR RADIOLOGIST EVAL & MGMT  08/16/2019   IR RADIOLOGIST EVAL & MGMT  11/22/2019   IR RADIOLOGIST EVAL & MGMT  06/05/2020   IR RADIOLOGIST EVAL & MGMT  11/27/2020   KNEE ARTHROSCOPY     LEFT HEART CATHETERIZATION WITH CORONARY ANGIOGRAM N/A 04/24/2014   Procedure: LEFT HEART CATHETERIZATION WITH CORONARY ANGIOGRAM;  Surgeon: Burnell Blanks, MD;  Location: [facility];  Service: Cardiovascular;  Laterality: N/A;   POLYPECTOMY  03/02/2019   Procedure: POLYPECTOMY;  Surgeon: Carol Ada, MD;  Location: [facility];  Service: Endoscopy;;   RADIOFREQUENCY ABLATION Right 08/05/2017   Procedure: CT RENAL CRYO AND BIOPSY;  Surgeon: Sandi Mariscal, MD;  Location: [facility];  Service:  Anesthesiology;  Laterality: Right;   RADIOFREQUENCY ABLATION Right 11/15/2018   Procedure: RIGHT RENAL CRYOABLATION;  Surgeon: Sandi Mariscal, MD;  Location: [facility];  Service: Anesthesiology;  Laterality: Right;   THYROIDECTOMY N/A 09/20/2014   Procedure: TOTAL THYROIDECTOMY;  Surgeon: Armandina Gemma, MD;  Location: [facility];  Service: General;  Laterality: N/A;   TONSILLECTOMY      Allergies: Amoxicillin, Lorabid [loracarbef], Augmentin [amoxicillin-pot clavulanate], Codeine, Furosemide, Gadolinium derivatives, Sulfa drugs cross reactors, Benicar [olmesartan medoxomil], and Other  Medications: Prior to Admission medications   Medication Sig Start Date End Date Taking? Authorizing Provider   acetaminophen (TYLENOL) 500 MG tablet Take 2 tablets (1,000 mg total) by mouth every 6 (six) hours as needed (body aches). 06/29/17   Geradine Girt, DO  albuterol (VENTOLIN HFA) 108 (90 Base) MCG/ACT inhaler Inhale 2 puffs every 4 to 6 hours as needed. 12/22/20   Lin Landsman, MD  apixaban (ELIQUIS) 5 MG TABS tablet Take 1 tablet by mouth 2 times daily. 09/23/21   Fenton, Clint R, PA  atorvastatin (LIPITOR) 80 MG tablet Take 1 tablet (80 mg total) by mouth at bedtime. 08/26/21   Janina Mayo, MD  CALCIUM-MAGNESIUM-ZINC PO Take 2 tablets by mouth 2 (two) times daily.    [provider]  diclofenac Sodium (VOLTAREN) 1 % GEL Apply to affected area(s) twice daily as directed 10/16/20   Delene Ruffini, MD  diphenhydrAMINE (BENADRYL) 25 MG tablet Take 25 mg by mouth at bedtime.    [provider]  fluticasone (FLONASE) 50 MCG/ACT nasal spray Place 2 sprays into both nostrils daily. 03/04/20   [provider]  ibuprofen (ADVIL) 200 MG tablet Take 800 mg by mouth every 8 (eight) hours as needed for headache or moderate pain.    [provider]  levothyroxine (SYNTHROID) 200 MCG tablet Take 1 tablet by mouth daily. 12/08/20     levothyroxine (SYNTHROID) 200 MCG tablet Take 1 tablet by mouth once daily 09/08/21   Lin Landsman, MD  lisinopril (ZESTRIL) 10 MG tablet Take 1 tablet by mouth once a day 10/22/21     metFORMIN (GLUCOPHAGE) 500 MG tablet Take 1 tablet (500 mg total) by mouth 2 (two) times daily with a meal. 10/22/21     metoprolol tartrate (LOPRESSOR) 100 MG tablet Take 1 tablet (100 mg total) by mouth 2 (two) times daily. 11/05/21   Fenton, Clint R, PA  metoprolol tartrate (LOPRESSOR) 100 MG tablet Take 1 tablet (100 mg total) by mouth 2 (two) times daily. 11/06/21     mupirocin ointment (BACTROBAN) 2 % Apply 1 application topically in the morning and at bedtime. Mole removal site on back 09/16/20   [provider]  ondansetron (ZOFRAN) 8 MG tablet TAKE 1  TABLET BY MOUTH 3 TIMES DAILY 05/25/21     pantoprazole (PROTONIX) 40 MG tablet Take 1 tablet (40 mg total) by mouth daily. 12/04/20   Frann Rider, NP  Probiotic Product (PROBIOTIC PO) Take 1 tablet by mouth daily.    [provider]  Semaglutide,0.25 or 0.'5MG'$ /DOS, (OZEMPIC, 0.25 OR 0.5 MG/DOSE,) 2 MG/1.5ML SOPN Inject 0.5 mg subcutaneously once a week. 04/15/21     Semaglutide,0.25 or 0.'5MG'$ /DOS, (OZEMPIC, 0.25 OR 0.5 MG/DOSE,) 2 MG/3ML SOPN Inject 0.5 mg subcutaneously once a week 08/31/21     Semaglutide,0.25 or 0.'5MG'$ /DOS, (OZEMPIC, 0.25 OR 0.5 MG/DOSE,) 2 MG/3ML SOPN Inject 0.5 mg into the skin once a week 11/06/21     spironolactone (ALDACTONE) 25 MG tablet Take 1 tablet by mouth  2 times a day 10/02/21     tobramycin-dexamethasone (TOBRADEX) ophthalmic solution Place 2 drops in the effected eye 4 times a day 09/08/21     Torsemide 40 MG TABS Take 1 tablet by mouth in the morning and at bedtime for 10 days. 03/25/21 04/04/21  Tedd Sias, PA  zinc gluconate 50 MG tablet Take 50 mg by mouth daily as needed (flu season).    [provider]     Family History  Problem Relation Age of Onset   Heart failure Mother    Diabetes Mother    Stroke Mother    Stroke Father    Heart failure Father    Cancer Father     Social History   Socioeconomic History   Marital status: Married    Spouse name: Glendell Docker   Number of children: 2   Years of education: 14   Highest education level: Not on file  Occupational History    Comment: NA, disabled  Tobacco Use   Smoking status: Never   Smokeless tobacco: Never   Tobacco comments:    Never smoke 07/14/21  Vaping Use   Vaping Use: Never used  Substance and Sexual Activity   Alcohol use: No   Drug use: No   Sexual activity: Not on file  Other Topics Concern   Not on file  Social History Narrative   Lives with husband and son   Social Determinants of Health   Financial Resource Strain: Not on file  Food Insecurity: Not on file   Transportation Needs: Not on file  Physical Activity: Not on file  Stress: Not on file  Social Connections: Not on file    ECOG Status: 1 - Symptomatic but completely ambulatory  Review of Systems  Review of Systems: A 12 point ROS discussed and pertinent positives are indicated in the HPI above.  All other systems are negative.    Physical Exam No direct physical exam was performed (except for noted visual exam findings with Video Visits).   Vital Signs: There were no vitals taken for this visit.  Imaging:    CT-guided right renal cryoablation - 11/15/2018; 08/05/2017 Abdominal MRI - 06/13/2017 CT abdomen and pelvis - 09/21/2017; 03/15/2018; 02/13/2019; 08/10/2019; 11/16/2019; 06/03/2020; 11/21/2020; 11/10/2021   CT ABDOMEN W WO CONTRAST  Result Date: 11/11/2021 CLINICAL DATA:  Follow-up RIGHT renal ablation x2 in 2019 2020. Additional history of thyroid carcinoma. * Tracking Code: BO * EXAM: CT ABDOMEN WITHOUT AND WITH CONTRAST TECHNIQUE: Multidetector CT imaging of the abdomen was performed following the standard protocol before and following the bolus administration of intravenous contrast. RADIATION DOSE REDUCTION: This exam was performed according to the departmental dose-optimization program which includes automated exposure control, adjustment of the mA and/or kV according to patient size and/or use of iterative reconstruction technique. CONTRAST:  123m OMNIPAQUE IOHEXOL 300 MG/ML  SOLN COMPARISON:  11/21/2020 FINDINGS: Lower chest:  Lung bases are clear. Hepatobiliary: No focal hepatic lesion. Postcholecystectomy. No biliary dilatation. Pancreas: Normal pancreatic parenchymal intensity. No ductal dilatation or inflammation. Spleen: Normal spleen. Adrenals/urinary tract: Adrenal glands normal. Post ablation tissue in the upper pole of the RIGHT kidney measures 13 mm x 14 mm (image 49/6) compared to 13 mm by 12 mm on prior. Postcontrast arterial imaging demonstrates no significant  post-contrast enhancement. Slight increase in density on the nephrogenic phase (image 48/11). On coronal imaging lesion appears nearly identical to prior (image 66/12). Second scalloped defect along the dorsal surface of the mid RIGHT kidney (image  54/11) is unchanged. Small cortical hypoenhancing lesion measuring 6 mm in the mid RIGHT kidney (image 63/11) is unchanged. Small cortical lesion which is hypodense in the upper pole LEFT kidney measuring 8 mm (45/11) is unchanged. Delayed imaging demonstrates no filling defects within collecting systems ureters. Stomach/Bowel: Stomach and limited of the small bowel is unremarkable Vascular/Lymphatic: Abdominal aortic normal caliber. No retroperitoneal periportal lymphadenopathy. Musculoskeletal: No aggressive osseous lesion IMPRESSION: 1. No significant interval change in ablation defect in the upper pole of the RIGHT kidney. Recommend continued surveillance. 2. Second scalloped lesion in the mid RIGHT kidney is unchanged. 3. Bilateral small hypodense lesions are too small to characterize but not changed from comparison CT. Recommend continued surveillance. 4. No adenopathy or skeletal metastasis. Electronically Signed   By: Suzy Bouchard M.D.   On: 11/11/2021 11:01   CUP PACEART REMOTE DEVICE CHECK  Result Date: 11/05/2021 ILR summary report received. Battery status OK. Normal device function. No new symptom, tachy, brady, or pause episodes. No new AF episodes. Monthly summary reports and ROV/PRN LA   Labs:  CBC: Recent Labs    04/05/21 1344 07/14/21 0843  WBC 6.5 7.2  HGB 12.1 11.5*  HCT 37.8 36.3  PLT 297 269    COAGS: No results for input(s): "INR", "APTT" in the last 8760 hours.  BMP: Recent Labs    04/05/21 1344 04/15/21 1555 04/28/21 1039 07/14/21 0843 11/10/21 1041  NA 139 135 138 138  --   K 4.6 5.8* 4.9 4.3  --   CL 109 104 110* 111  --   CO2 21* 15* 18* 19*  --   GLUCOSE 147* 173* 197* 188*  --   BUN '10 23 20 20  '$ --    CALCIUM 8.6* 9.1 9.0 8.5*  --   CREATININE 1.02* 1.40* 1.19* 1.05* 1.10*  GFRNONAA >60  --   --  >60  --     LIVER FUNCTION TESTS: No results for input(s): "BILITOT", "AST", "ALT", "ALKPHOS", "PROT", "ALBUMIN" in the last 8760 hours.  TUMOR MARKERS: No results for input(s): "AFPTM", "CEA", "CA199", "CHROMGRNA" in the last 8760 hours.  Assessment and Plan:  [name] is a 61 y.o. female is a 61 year old female with past medical history significant for diabetes, hypertension, hyperlipidemia and thyroid cancer who initially underwent image guided right-sided renal cryoablation on 08/05/2017 of 2 right-sided renal lesions (superior pole and posterior interpolar lesions) who was found to have residual disease involving the superior pole lesion for which she underwent a repeat renal cryoablation on 11/15/2018.     She is seen in follow-up consultation via telemedicine following the acquisition of postprocedural CT scan performed 11/24/2020.   CT-guided right renal cryoablation - 11/15/2018; 08/05/2017 Abdominal MRI - 06/13/2017 CT abdomen and pelvis - 09/21/2017; 03/15/2018; 02/13/2019; 08/10/2019; 11/16/2019; 06/03/2020; 11/21/2020; 11/10/2021   Personal review of most recent surveillance abdominal CT performed 11/10/2021 demonstrates stable appearance of both cryoablation sites of the right kidney without definitive residual nodular enhancement to suggest residual/recurrent disease and without new worrisome renal lesion.    This examination documents 3 years of stability since undergoing the final cryoablation performed 11/15/2018 and compatible with an imaging cure.   Plan: - No additional dedicated followed up for right sided renal cryoablation sites.  A copy of this report was sent to the requesting provider on this date.  Electronically Signed: Sandi Mariscal 11/19/2021, 10:06 AM   I spent a total of 5 Minutes in remote  clinical consultation, greater than 50% of which was  counseling/coordinating care for post right sided renal cryoablation.    Visit type: Audio only (telephone). Audio (no video) only due to patient's lack of internet/smartphone capability. Alternative for in-person consultation at [facility], [address] [place], [place], [place]. This visit type was conducted due to national recommendations for restrictions regarding the COVID-19 Pandemic (e.g. social distancing).  This format is felt to be most appropriate for this patient at this time.  All issues noted in this document were discussed and addressed.

## 2021-11-20 ENCOUNTER — Telehealth: Payer: Medicare PPO

## 2021-11-21 ENCOUNTER — Other Ambulatory Visit: Payer: Self-pay | Admitting: Adult Health

## 2021-11-21 ENCOUNTER — Other Ambulatory Visit (HOSPITAL_COMMUNITY): Payer: Self-pay

## 2021-11-23 ENCOUNTER — Other Ambulatory Visit (HOSPITAL_COMMUNITY): Payer: Self-pay

## 2021-11-25 ENCOUNTER — Telehealth: Payer: Self-pay | Admitting: Adult Health

## 2021-11-25 ENCOUNTER — Other Ambulatory Visit (HOSPITAL_COMMUNITY): Payer: Self-pay

## 2021-11-25 MED ORDER — PANTOPRAZOLE SODIUM 40 MG PO TBEC
40.0000 mg | DELAYED_RELEASE_TABLET | Freq: Every day | ORAL | 1 refills | Status: DC
  Filled 2021-11-25: qty 90, 90d supply, fill #0
  Filled 2022-02-25: qty 90, 90d supply, fill #1

## 2021-11-25 NOTE — Telephone Encounter (Signed)
Pt is calling for a refill on pantoprazole (PROTONIX) 40 MG tablet. Pt is requesting prescription be sent to [facility]. Pt scheduled a yearly appointment  for 9/21 @ 8:15 am with Janett Billow.

## 2021-11-25 NOTE — Telephone Encounter (Signed)
Refill sent.

## 2021-12-04 ENCOUNTER — Other Ambulatory Visit (HOSPITAL_COMMUNITY): Payer: Self-pay

## 2021-12-07 ENCOUNTER — Other Ambulatory Visit (HOSPITAL_COMMUNITY): Payer: Self-pay

## 2021-12-14 ENCOUNTER — Ambulatory Visit (INDEPENDENT_AMBULATORY_CARE_PROVIDER_SITE_OTHER): Payer: Medicare PPO

## 2021-12-14 DIAGNOSIS — G459 Transient cerebral ischemic attack, unspecified: Secondary | ICD-10-CM | POA: Diagnosis not present

## 2021-12-15 LAB — CUP PACEART REMOTE DEVICE CHECK
Date Time Interrogation Session: 20230917230521
Implantable Pulse Generator Implant Date: 20220307

## 2021-12-16 NOTE — Progress Notes (Unsigned)
[facility]  PATIENT: MASON BURLEIGH DOB: 05/12/1960  REFERRING CLINICIAN: Lin Landsman, MD HISTORY FROM: patient REASON FOR VISIT: TIA hospital follow-up   HISTORICAL  CHIEF COMPLAINT:  No chief complaint on file.    HISTORY OF PRESENT ILLNESS:   Update 12/17/2021 JM: Patient returns for stroke follow-up after prior visit 1 year ago.  Overall stable from stroke standpoint without new or reoccurring stroke/TIA symptoms.  Diagnosed with atrial fibrillation 3/12, CHA2DS2-VASc score and Eliquis initiated.  She has remained on Eliquis 5 mg twice daily without side effects.  Closely followed by cardiology.  Blood pressure ***.  Closely followed by PCP.        Update 12/04/2020 JM: Seen by Dr. Leta Baptist 5/9 for TIA.  Had another TIA event 7/20 with left-sided weakness and slurred speech.  MRI brain negative for acute stroke.  CTA head/neck 50% stenosis R VA and minimal arthrosclerotic change left carotid bifurcation.  LDL 81.  A1c 9.0.  Initiated Brilinta and aspirin for 30 days and aspirin alone and continuation of atorvastatin.  Loop recorder negative for atrial fibrillation.  C/o continued gait difficulties with imbalance, lower extremity weakness as well as cognitive concerns all present since admission.  Continues to ambulate with a cane.  Denies any recent falls.  She does endorse increased stressors continues to reside with her husband and looks after her grandchildren while her daughter is at work.  She discontinued aspirin due to GI upset and has remained on Brilinta.  Remains on atorvastatin 40 mg daily.  Blood pressure today 125/91.  Continues to follow with endocrinology for diabetes and hypothyroidism management.  No further concerns at this time.   UPDATE (08/04/20, VRP): Since last visit, doing well until Dec 2021 --> had another TIA event (left face drooping, left sided numbness, x 1 hour). Then resolved. TIA workup completed at hospital. Now back to  baseline. Has implanted loop recorder currently.   PRIOR HPI ([id number]): 61 year old female here for evaluation of possible transient ischemic attacks.   June 2017 patient had onset of numbness, tingling, lip twitching, nausea and vomiting. Patient was admitted for stroke workup. MRI of the brain was negative. Stroke risk factors were identified and treated.  February 2018 patient had onset of left arm and face numbness and weakness lasting for 4-6 hours. Patient was admitted for workup. MRI of the brain was negative for acute stroke. Stroke risk factors were evaluated and treated.  Since that time symptoms have resolved. Patient continues to have other issues including memory loss, headaches, numbness, weakness, dizziness, not asleep, joint pain, increased thirst, short of breath, fevers, chills, fatigue, chest pain, swelling of legs, ringing in ears, spinning sensation.  Patient has history of diabetes, hypertension, thyroid cancer, irregular heartbeat/atrial fibrillation in the past.    REVIEW OF SYSTEMS: Full 14 system review of systems performed and negative with exception of: As per history of present illness.   ALLERGIES: Allergies  Allergen Reactions   Amoxicillin Other (See Comments) and Anaphylaxis    Resulted in ambulance per pt   Lorabid [Loracarbef] Anaphylaxis   Augmentin [Amoxicillin-Pot Clavulanate] Hives, Nausea And Vomiting and Other (See Comments)    Chest pain   Codeine Nausea And Vomiting   Furosemide Swelling    Patient states has reaction based on sulfa allergy   Gadolinium Derivatives Itching, Swelling and Other (See Comments)    Patient will require 13 hr prep prior to administration of gadolinium     Sulfa Drugs Cross Reactors Other (See  Comments)    Severe allergic reaction as a child - was not told symptoms... states she has had sulfa reaction to furosemide   Benicar [Olmesartan Medoxomil] Swelling and Rash   Other Rash    Green peas    HOME  MEDICATIONS: Outpatient Medications Prior to Visit  Medication Sig Dispense Refill   acetaminophen (TYLENOL) 500 MG tablet Take 2 tablets (1,000 mg total) by mouth every 6 (six) hours as needed (body aches). 30 tablet 0   albuterol (VENTOLIN HFA) 108 (90 Base) MCG/ACT inhaler Inhale 2 puffs every 4 to 6 hours as needed. 18 g 2   apixaban (ELIQUIS) 5 MG TABS tablet Take 1 tablet by mouth 2 times daily. 60 tablet 9   atorvastatin (LIPITOR) 80 MG tablet Take 1 tablet (80 mg total) by mouth at bedtime. 90 tablet 1   CALCIUM-MAGNESIUM-ZINC PO Take 2 tablets by mouth 2 (two) times daily.     diclofenac Sodium (VOLTAREN) 1 % GEL Apply to affected area(s) twice daily as directed 100 g 0   diphenhydrAMINE (BENADRYL) 25 MG tablet Take 25 mg by mouth at bedtime.     fluticasone (FLONASE) 50 MCG/ACT nasal spray Place 2 sprays into both nostrils daily.     ibuprofen (ADVIL) 200 MG tablet Take 800 mg by mouth every 8 (eight) hours as needed for headache or moderate pain.     levothyroxine (SYNTHROID) 200 MCG tablet Take 1 tablet by mouth daily. 90 tablet 3   levothyroxine (SYNTHROID) 200 MCG tablet Take 1 tablet by mouth once daily 90 tablet 1   lisinopril (ZESTRIL) 10 MG tablet Take 1 tablet by mouth once a day 30 tablet 1   metFORMIN (GLUCOPHAGE) 500 MG tablet Take 1 tablet (500 mg total) by mouth 2 (two) times daily with a meal. 60 tablet 2   metoprolol tartrate (LOPRESSOR) 100 MG tablet Take 1 tablet (100 mg total) by mouth 2 (two) times daily. 60 tablet 9   metoprolol tartrate (LOPRESSOR) 100 MG tablet Take 1 tablet (100 mg total) by mouth 2 (two) times daily. 60 tablet 0   mupirocin ointment (BACTROBAN) 2 % Apply 1 application topically in the morning and at bedtime. Mole removal site on back     ondansetron (ZOFRAN) 8 MG tablet TAKE 1 TABLET BY MOUTH 3 TIMES DAILY 20 tablet 4   pantoprazole (PROTONIX) 40 MG tablet Take 1 tablet (40 mg total) by mouth daily. 90 tablet 1   Probiotic Product (PROBIOTIC  PO) Take 1 tablet by mouth daily.     Semaglutide,0.25 or 0.'5MG'$ /DOS, (OZEMPIC, 0.25 OR 0.5 MG/DOSE,) 2 MG/1.5ML SOPN Inject 0.5 mg subcutaneously once a week. 1.5 mL 1   Semaglutide,0.25 or 0.'5MG'$ /DOS, (OZEMPIC, 0.25 OR 0.5 MG/DOSE,) 2 MG/3ML SOPN Inject 0.5 mg subcutaneously once a week 3 mL 1   Semaglutide,0.25 or 0.'5MG'$ /DOS, (OZEMPIC, 0.25 OR 0.5 MG/DOSE,) 2 MG/3ML SOPN Inject 0.5 mg into the skin once a week 3 mL 0   spironolactone (ALDACTONE) 25 MG tablet Take 1 tablet by mouth 2 times a day 60 tablet 2   tobramycin-dexamethasone (TOBRADEX) ophthalmic solution Place 2 drops in the effected eye 4 times a day 5 mL 0   Torsemide 40 MG TABS Take 1 tablet by mouth in the morning and at bedtime for 10 days. 20 tablet 0   zinc gluconate 50 MG tablet Take 50 mg by mouth daily as needed (flu season).     No facility-administered medications prior to visit.    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 "   Asthma    due to allergies    Cancer ([place])    THRYOID 2016 [place], KIDNEY CANCER MAY 2018  RIGHT RENAL CRYOABLATION   Complication of anesthesia    Diabetes mellitus    TYPE 2   Family history of adverse reaction to anesthesia    difficult for father to wake after surgery, AFTHER LOW BP WITH ANESTHESIA   HOH (hard of hearing)    Hypercholesteremia    Hypertension    PONV (postoperative nausea and vomiting)    Renal cancer, right ([place]) dx'd 07/2018, 10/2018   ablation x 2    Thyroid disease    TIA (transient ischemic attack)    LAST TIA  APRIL 2020 ON PLAVIX, reports residual effects ,02/2020    PAST SURGICAL HISTORY: Past Surgical History:  Procedure Laterality Date   ABDOMINAL HYSTERECTOMY     COMPLETE   BIOPSY  03/02/2019   Procedure: BIOPSY;  Surgeon: Carol Ada, MD;  Location: [facility];  Service: Endoscopy;;   CHOLECYSTECTOMY     COLON SURGERY     COLONOSCOPY WITH PROPOFOL N/A 03/02/2019    Procedure: COLONOSCOPY WITH PROPOFOL;  Surgeon: Carol Ada, MD;  Location: [facility];  Service: Endoscopy;  Laterality: N/A;   ear surgeries      implantable loop recorder placement  06/02/2020   Medtronic Reveal Linq model LNQ22 implantable loop recorder (SN [id number])    IR RADIOLOGIST EVAL & MGMT  01/12/2017   IR RADIOLOGIST EVAL & MGMT  06/16/2017   IR RADIOLOGIST EVAL & MGMT  08/31/2017   IR RADIOLOGIST EVAL & MGMT  12/13/2017   IR RADIOLOGIST EVAL & MGMT  03/16/2018   IR RADIOLOGIST EVAL & MGMT  11/01/2018   IR RADIOLOGIST EVAL & MGMT  12/13/2018   IR RADIOLOGIST EVAL & MGMT  02/15/2019   IR RADIOLOGIST EVAL & MGMT  08/16/2019   IR RADIOLOGIST EVAL & MGMT  11/22/2019   IR RADIOLOGIST EVAL & MGMT  06/05/2020   IR RADIOLOGIST EVAL & MGMT  11/27/2020   IR RADIOLOGIST EVAL & MGMT  11/19/2021   KNEE ARTHROSCOPY     LEFT HEART CATHETERIZATION WITH CORONARY ANGIOGRAM N/A 04/24/2014   Procedure: LEFT HEART CATHETERIZATION WITH CORONARY ANGIOGRAM;  Surgeon: Burnell Blanks, MD;  Location: [facility] CATH LAB;  Service: Cardiovascular;  Laterality: N/A;   POLYPECTOMY  03/02/2019   Procedure: POLYPECTOMY;  Surgeon: Carol Ada, MD;  Location: [facility];  Service: Endoscopy;;   RADIOFREQUENCY ABLATION Right 08/05/2017   Procedure: CT RENAL CRYO AND BIOPSY;  Surgeon: Sandi Mariscal, MD;  Location: [facility];  Service: Anesthesiology;  Laterality: Right;   RADIOFREQUENCY ABLATION Right 11/15/2018   Procedure: RIGHT RENAL CRYOABLATION;  Surgeon: Sandi Mariscal, MD;  Location: [facility];  Service: Anesthesiology;  Laterality: Right;   THYROIDECTOMY N/A 09/20/2014   Procedure: TOTAL THYROIDECTOMY;  Surgeon: Armandina Gemma, MD;  Location: [facility];  Service: General;  Laterality: N/A;   TONSILLECTOMY      FAMILY HISTORY: Family History  Problem Relation Age of Onset   Heart failure Mother    Diabetes Mother    Stroke Mother    Stroke Father    Heart failure Father    Cancer Father     SOCIAL HISTORY:  Social  History   Socioeconomic History   Marital status: Married  Spouse name: Glendell Docker   Number of children: 2   Years of education: 14   Highest education level: Not on file  Occupational History    Comment: NA, disabled  Tobacco Use   Smoking status: Never   Smokeless tobacco: Never   Tobacco comments:    Never smoke 07/14/21  Vaping Use   Vaping Use: Never used  Substance and Sexual Activity   Alcohol use: No   Drug use: No   Sexual activity: Not on file  Other Topics Concern   Not on file  Social History Narrative   Lives with husband and son   Social Determinants of Health   Financial Resource Strain: Not on file  Food Insecurity: Not on file  Transportation Needs: Not on file  Physical Activity: Not on file  Stress: Not on file  Social Connections: Not on file  Intimate Partner Violence: Not on file     PHYSICAL EXAM  GENERAL EXAM/CONSTITUTIONAL: Vitals:  There were no vitals filed for this visit.   There is no height or weight on file to calculate BMI. No results found. Patient is in no distress; well developed, nourished and groomed; neck is supple  CARDIOVASCULAR: Examination of carotid arteries is normal; no carotid bruits Regular rate and rhythm, no murmurs Examination of peripheral vascular system by observation and palpation is normal  EYES: Ophthalmoscopic exam of optic discs and posterior segments is normal; no papilledema or hemorrhages  MUSCULOSKELETAL: Gait, strength, tone, movements noted in Neurologic exam below  NEUROLOGIC: MENTAL STATUS:  awake, alert, oriented to person, place and time recent memory subjectively impaired and remote memory intact normal attention and concentration language fluent, comprehension intact, naming intact,  fund of knowledge appropriate  CRANIAL NERVE:  2nd - no papilledema on fundoscopic exam 2nd, 3rd, 4th, 6th - pupils equal and reactive to light, visual fields full to confrontation, extraocular muscles  intact, no nystagmus 5th - facial sensation symmetric 7th - facial strength symmetric 8th - hearing intact 9th - palate elevates symmetrically, uvula midline 11th - shoulder shrug symmetric 12th - tongue protrusion midline  MOTOR:  normal bulk and tone, full strength in the BUE, BLE  SENSORY:  normal and symmetric to light touch ABSENT VIB AT TOES, ANKLES AND KNEES  COORDINATION:  finger-nose-finger, fine finger movements normal  REFLEXES:  deep tendon reflexes TRACE and symmetric  GAIT/STATION:  ANTALGIC, LIMPING GAIT; LEFT LEG PAIN    DIAGNOSTIC DATA (LABS, IMAGING, TESTING) - I reviewed patient records, labs, notes, testing and imaging myself where available.  Lab Results  Component Value Date   WBC 7.2 07/14/2021   HGB 11.5 (L) 07/14/2021   HCT 36.3 07/14/2021   MCV 94.3 07/14/2021   PLT 269 07/14/2021      Component Value Date/Time   NA 138 07/14/2021 0843   NA 138 04/28/2021 1039   K 4.3 07/14/2021 0843   CL 111 07/14/2021 0843   CO2 19 (L) 07/14/2021 0843   GLUCOSE 188 (H) 07/14/2021 0843   BUN 20 07/14/2021 0843   BUN 20 04/28/2021 1039   CREATININE 1.10 (H) 11/10/2021 1041   CREATININE 1.15 (H) 12/11/2018 0751   CALCIUM 8.5 (L) 07/14/2021 0843   PROT 6.7 10/15/2020 1025   ALBUMIN 3.6 10/15/2020 1025   AST 18 10/15/2020 1025   ALT 18 10/15/2020 1025   ALKPHOS 58 10/15/2020 1025   BILITOT 0.8 10/15/2020 1025   GFRNONAA >60 07/14/2021 0843   GFRNONAA 53 (L) 12/11/2018 0751   GFRAA 62  12/11/2018 0751   Lab Results  Component Value Date   CHOL 181 10/15/2020   HDL 38 (L) 10/15/2020   LDLCALC 81 10/15/2020   TRIG 311 (H) 10/15/2020   CHOLHDL 4.8 10/15/2020   Lab Results  Component Value Date   HGBA1C 9.0 (H) 10/15/2020   Lab Results  Component Value Date   VITAMINB12 354 12/04/2020   Lab Results  Component Value Date   TSH <0.010 (L) 06/26/2017     09/13/15 CT soft tissue neck - LEFT parotid appears slightly larger than the RIGHT,  correlate clinically for mild inflammatory change in the LEFT parotid tail. - LEFT paramedian medial incisor periapical lucency. Uncertain significance with regard and jaw pain. - No mandibular periodontal disease or other significant abnormality.   - Status post thyroidectomy.  No concerning lymphadenopathy.  05/16/16 MRI brain - No acute intracranial abnormality. Stable since 2017 and largely unremarkable for age noncontrast MRI appearance of the brain.  05/16/16 MRA head  - Stable and negative intracranial MRA.  05/17/16 carotid u/s - The vertebral arteries appear patent with antegrade flow. - Findings consistent with 1-39 percent stenosis involving the right internal carotid artery and the left internal carotid artery.  05/17/16 TTE  - Left ventricle: The cavity size was normal. Wall thickness was   increased in a pattern of mild LVH. Systolic function was normal.   The estimated ejection fraction was in the range of 60% to 65%.   Left ventricular diastolic function parameters were normal. - Left atrium: The atrium was mildly dilated. - Atrial septum: No defect or patent foramen ovale was identified.  Dec 2021 TIA workup (per Dr Erlinda Hong note 03/10/20): CT head No acute abnormality. Mild Small vessel disease.  MRI  No acute abnormality. Mild Small vessel disease.  CTA head & neck no LVO. Minimal atherosclerosis. Partially empty sella w/ effaced transverse and sigmoid sinus. Coronary atherosclerosis. 2D Echo EF 60-65% LDL 54 HgbA1c 8.4  09/2020 TIA work up [place] IV Contrast 1. Normal head CT for age. 2. ASPECTS is 10.   CT Angio Head and Neck W WO IV Contrast No acute large vessel occlusion.   Minimal atherosclerotic change at the left carotid bifurcation but no stenosis.   50% stenosis of the right vertebral artery origin. Posterior circulation appears otherwise normal.   MRI Brain WO IV Contrast No acute or significant finding. Mild chronic small-vessel ischemic change of  the hemispheric white matter, unchanged since last year.   Echocardiogram Complete  EF 60 to 65%     ASSESSMENT AND PLAN  61 y.o. year old female here with hypertension, diabetes, with 3 episodes of strokelike symptoms with most recent 09/2018, with negative MRI brain and stroke work-up.  Diagnosed with A-fib 05/2021 and started on Eliquis     PLAN:  TIA vs complicated migraine -Continue Eliquis and atorvastatin 40 mg daily for secondary stroke prevention measures -Routine follow-up with PCP for aggressive stroke risk factor management including HTN with BP goal<130/90, HLD with LDL goal<70 and DM with A1c goal<7 -Continue to follow with cardiology for atrial fibrillation and Eliquis management  Gait impairment -Seems to be more chronic issue -referral placed to PT  Memory complaints -Likely multifactorial -Referral placed to SLP for cognitive therapy   No follow-ups on file.   CC:  Lin Landsman, MD   I spent 43 minutes of face-to-face and non-face-to-face time with patient.  This included previsit chart review including review of recent hospitalization, lab review, study review,  order entry, electronic health record documentation, patient education regarding above topics, possible etiologies and further treatment options and answered all other questions to patient satisfaction  Frann Rider, [facility]  [facility] [address] [place], [place] [address]  Phone [phone] Fax [phone] Note: This document was prepared with digital dictation and possible smart phrase technology. Any transcriptional errors that result from this process are unintentional.  I reviewed the above note and documentation by the Nurse Practitioner and agree with the history, exam, assessment and plan as outlined above. I was available for consultation. Star Age, MD, PhD [facility])

## 2021-12-17 ENCOUNTER — Ambulatory Visit: Payer: Medicare PPO | Admitting: Adult Health

## 2021-12-17 ENCOUNTER — Encounter: Payer: Self-pay | Admitting: Adult Health

## 2021-12-17 ENCOUNTER — Other Ambulatory Visit (HOSPITAL_COMMUNITY): Payer: Self-pay

## 2021-12-17 VITALS — BP 130/86 | HR 76 | Ht 64.0 in | Wt 238.0 lb

## 2021-12-17 DIAGNOSIS — I480 Paroxysmal atrial fibrillation: Secondary | ICD-10-CM | POA: Diagnosis not present

## 2021-12-17 DIAGNOSIS — G459 Transient cerebral ischemic attack, unspecified: Secondary | ICD-10-CM

## 2021-12-17 NOTE — Patient Instructions (Signed)
Continue  Eliquis 5 mg twice daily   and atorvastatin for secondary stroke prevention  Follow-up with cardiology for atrial fibrillation and Eliquis management  Continue to follow up with PCP regarding blood pressure and cholesterol management and endocrinology for diabetes management Maintain strict control of hypertension with blood pressure goal below 130/90, diabetes with hemoglobin A1c goal below 7.0 % and cholesterol with LDL cholesterol (bad cholesterol) goal below 70 mg/dL.   Signs of a Stroke? Follow the BEFAST method:  Balance Watch for a sudden loss of balance, trouble with coordination or vertigo Eyes Is there a sudden loss of vision in one or both eyes? Or double vision?  Face: Ask the person to smile. Does one side of the face droop or is it numb?  Arms: Ask the person to raise both arms. Does one arm drift downward? Is there weakness or numbness of a leg? Speech: Ask the person to repeat a simple phrase. Does the speech sound slurred/strange? Is the person confused ? Time: If you observe any of these signs, call 911.      Thank you for coming to see [place] at [facility]. I hope we have been able to provide you high quality care today.  You may receive a patient satisfaction survey over the next few weeks. We would appreciate your feedback and comments so that we may continue to improve ourselves and the health of our patients.

## 2021-12-18 ENCOUNTER — Other Ambulatory Visit (HOSPITAL_COMMUNITY): Payer: Self-pay

## 2021-12-18 MED ORDER — OZEMPIC (0.25 OR 0.5 MG/DOSE) 2 MG/3ML ~~LOC~~ SOPN
0.5000 mg | PEN_INJECTOR | SUBCUTANEOUS | 2 refills | Status: DC
  Filled 2021-12-18: qty 3, 28d supply, fill #0
  Filled 2022-01-21: qty 3, 28d supply, fill #1
  Filled 2022-02-17: qty 3, 28d supply, fill #2

## 2021-12-21 ENCOUNTER — Other Ambulatory Visit (HOSPITAL_COMMUNITY): Payer: Self-pay

## 2021-12-22 ENCOUNTER — Other Ambulatory Visit (HOSPITAL_COMMUNITY): Payer: Self-pay

## 2021-12-23 ENCOUNTER — Other Ambulatory Visit (HOSPITAL_COMMUNITY): Payer: Self-pay

## 2021-12-24 ENCOUNTER — Other Ambulatory Visit (HOSPITAL_COMMUNITY): Payer: Self-pay

## 2021-12-28 ENCOUNTER — Other Ambulatory Visit (HOSPITAL_COMMUNITY): Payer: Self-pay

## 2021-12-28 MED ORDER — LISINOPRIL 10 MG PO TABS
10.0000 mg | ORAL_TABLET | Freq: Every day | ORAL | 1 refills | Status: DC
  Filled 2021-12-28: qty 30, 30d supply, fill #0
  Filled 2022-01-27: qty 30, 30d supply, fill #1

## 2021-12-28 NOTE — Progress Notes (Signed)
Carelink Summary Report / Loop Recorder 

## 2022-01-04 ENCOUNTER — Telehealth: Payer: Self-pay | Admitting: *Deleted

## 2022-01-04 ENCOUNTER — Encounter: Payer: Self-pay | Admitting: *Deleted

## 2022-01-04 NOTE — Telephone Encounter (Signed)
Letter has been sent to patient informing them that their sleep study has expired. Patient will need to call and schedule an office visit to re-evaluate the need for a sleep study.    

## 2022-01-05 ENCOUNTER — Other Ambulatory Visit (HOSPITAL_COMMUNITY): Payer: Self-pay

## 2022-01-18 ENCOUNTER — Ambulatory Visit (INDEPENDENT_AMBULATORY_CARE_PROVIDER_SITE_OTHER): Payer: Medicare PPO

## 2022-01-18 DIAGNOSIS — G459 Transient cerebral ischemic attack, unspecified: Secondary | ICD-10-CM

## 2022-01-19 LAB — CUP PACEART REMOTE DEVICE CHECK
Date Time Interrogation Session: 20231022230943
Implantable Pulse Generator Implant Date: 20220307

## 2022-01-21 ENCOUNTER — Other Ambulatory Visit (HOSPITAL_COMMUNITY): Payer: Self-pay

## 2022-01-22 ENCOUNTER — Other Ambulatory Visit (HOSPITAL_COMMUNITY): Payer: Self-pay

## 2022-01-27 ENCOUNTER — Other Ambulatory Visit (HOSPITAL_COMMUNITY): Payer: Self-pay

## 2022-01-28 ENCOUNTER — Other Ambulatory Visit (HOSPITAL_COMMUNITY): Payer: Self-pay

## 2022-01-28 MED ORDER — LISINOPRIL 10 MG PO TABS
10.0000 mg | ORAL_TABLET | Freq: Every day | ORAL | 1 refills | Status: DC
  Filled 2022-01-28 – 2022-02-25 (×2): qty 90, 90d supply, fill #0
  Filled 2022-05-03 – 2022-05-21 (×2): qty 90, 90d supply, fill #1

## 2022-01-29 ENCOUNTER — Other Ambulatory Visit (HOSPITAL_COMMUNITY): Payer: Self-pay

## 2022-02-01 ENCOUNTER — Other Ambulatory Visit (HOSPITAL_COMMUNITY): Payer: Self-pay

## 2022-02-01 MED ORDER — METFORMIN HCL 500 MG PO TABS
500.0000 mg | ORAL_TABLET | Freq: Two times a day (BID) | ORAL | 2 refills | Status: DC
  Filled 2022-02-01: qty 60, 30d supply, fill #0
  Filled 2022-03-04: qty 60, 30d supply, fill #1
  Filled 2022-04-02: qty 60, 30d supply, fill #2

## 2022-02-02 ENCOUNTER — Other Ambulatory Visit (HOSPITAL_COMMUNITY): Payer: Self-pay

## 2022-02-03 ENCOUNTER — Other Ambulatory Visit (HOSPITAL_COMMUNITY): Payer: Self-pay

## 2022-02-08 ENCOUNTER — Other Ambulatory Visit (HOSPITAL_COMMUNITY): Payer: Self-pay

## 2022-02-09 ENCOUNTER — Other Ambulatory Visit (HOSPITAL_COMMUNITY): Payer: Self-pay

## 2022-02-12 NOTE — Progress Notes (Signed)
Carelink Summary Report / Loop Recorder 

## 2022-02-17 ENCOUNTER — Other Ambulatory Visit (HOSPITAL_COMMUNITY): Payer: Self-pay

## 2022-02-22 ENCOUNTER — Ambulatory Visit (INDEPENDENT_AMBULATORY_CARE_PROVIDER_SITE_OTHER): Payer: Medicare PPO

## 2022-02-22 DIAGNOSIS — G459 Transient cerebral ischemic attack, unspecified: Secondary | ICD-10-CM | POA: Diagnosis not present

## 2022-02-23 LAB — CUP PACEART REMOTE DEVICE CHECK
Date Time Interrogation Session: 20231126230600
Implantable Pulse Generator Implant Date: 20220307

## 2022-02-25 ENCOUNTER — Other Ambulatory Visit (HOSPITAL_COMMUNITY): Payer: Self-pay

## 2022-02-25 ENCOUNTER — Other Ambulatory Visit: Payer: Self-pay | Admitting: Internal Medicine

## 2022-02-25 ENCOUNTER — Telehealth: Payer: Self-pay | Admitting: Internal Medicine

## 2022-02-25 MED ORDER — ATORVASTATIN CALCIUM 80 MG PO TABS
80.0000 mg | ORAL_TABLET | Freq: Every day | ORAL | 1 refills | Status: DC
  Filled 2022-02-25: qty 90, 90d supply, fill #0

## 2022-02-25 NOTE — Telephone Encounter (Signed)
*  STAT* If patient is at the pharmacy, call can be transferred to refill team.   1. Which medications need to be refilled? (please list name of each medication and dose if known)   atorvastatin (LIPITOR) 80 MG tablet   2. Which pharmacy/location (including street and city if local pharmacy) is medication to be sent to?  [place]   3. Do they need a 30 day or 90 day supply?  90 day  Patient stated she has 3 tablets left.  Patient has appointment scheduled on 12/22.

## 2022-02-26 ENCOUNTER — Other Ambulatory Visit (HOSPITAL_COMMUNITY): Payer: Self-pay

## 2022-02-26 MED ORDER — ATORVASTATIN CALCIUM 80 MG PO TABS
80.0000 mg | ORAL_TABLET | Freq: Every day | ORAL | 3 refills | Status: DC
  Filled 2022-02-26 – 2022-05-22 (×2): qty 90, 90d supply, fill #0
  Filled 2022-08-17: qty 90, 90d supply, fill #1
  Filled 2022-12-03: qty 90, 90d supply, fill #2

## 2022-02-27 ENCOUNTER — Other Ambulatory Visit (HOSPITAL_COMMUNITY): Payer: Self-pay

## 2022-03-04 ENCOUNTER — Other Ambulatory Visit (HOSPITAL_COMMUNITY): Payer: Self-pay

## 2022-03-11 ENCOUNTER — Other Ambulatory Visit (HOSPITAL_COMMUNITY): Payer: Self-pay

## 2022-03-12 ENCOUNTER — Other Ambulatory Visit (HOSPITAL_COMMUNITY): Payer: Self-pay

## 2022-03-12 ENCOUNTER — Other Ambulatory Visit: Payer: Self-pay

## 2022-03-12 MED ORDER — OZEMPIC (0.25 OR 0.5 MG/DOSE) 2 MG/3ML ~~LOC~~ SOPN
0.5000 mg | PEN_INJECTOR | SUBCUTANEOUS | 2 refills | Status: DC
  Filled 2022-03-12: qty 3, 28d supply, fill #0
  Filled 2022-04-13: qty 3, 28d supply, fill #1
  Filled 2022-05-18: qty 3, 28d supply, fill #2

## 2022-03-15 ENCOUNTER — Other Ambulatory Visit (HOSPITAL_COMMUNITY): Payer: Self-pay

## 2022-03-16 ENCOUNTER — Other Ambulatory Visit (HOSPITAL_COMMUNITY): Payer: Self-pay

## 2022-03-17 ENCOUNTER — Other Ambulatory Visit (HOSPITAL_COMMUNITY): Payer: Self-pay

## 2022-03-18 ENCOUNTER — Other Ambulatory Visit (HOSPITAL_COMMUNITY): Payer: Self-pay

## 2022-03-19 ENCOUNTER — Other Ambulatory Visit (HOSPITAL_COMMUNITY): Payer: Self-pay

## 2022-03-19 ENCOUNTER — Ambulatory Visit: Payer: Medicare PPO | Admitting: Internal Medicine

## 2022-03-19 MED ORDER — LEVOTHYROXINE SODIUM 200 MCG PO TABS
200.0000 ug | ORAL_TABLET | Freq: Every day | ORAL | 1 refills | Status: DC
  Filled 2022-03-19: qty 90, 90d supply, fill #0
  Filled 2022-06-25: qty 90, 90d supply, fill #1

## 2022-03-31 ENCOUNTER — Ambulatory Visit (INDEPENDENT_AMBULATORY_CARE_PROVIDER_SITE_OTHER): Payer: Medicare PPO

## 2022-03-31 DIAGNOSIS — G459 Transient cerebral ischemic attack, unspecified: Secondary | ICD-10-CM | POA: Diagnosis not present

## 2022-03-31 LAB — CUP PACEART REMOTE DEVICE CHECK
Date Time Interrogation Session: 20240102230407
Implantable Pulse Generator Implant Date: 20220307

## 2022-04-02 ENCOUNTER — Other Ambulatory Visit (HOSPITAL_COMMUNITY): Payer: Self-pay

## 2022-04-05 NOTE — Progress Notes (Signed)
Carelink Summary Report / Loop Recorder 

## 2022-04-14 ENCOUNTER — Other Ambulatory Visit (HOSPITAL_COMMUNITY): Payer: Self-pay

## 2022-04-22 ENCOUNTER — Ambulatory Visit: Payer: Medicare PPO | Attending: Internal Medicine | Admitting: Internal Medicine

## 2022-04-22 ENCOUNTER — Encounter: Payer: Self-pay | Admitting: Internal Medicine

## 2022-04-22 VITALS — BP 118/86 | HR 82 | Ht 62.0 in | Wt 237.2 lb

## 2022-04-22 DIAGNOSIS — I480 Paroxysmal atrial fibrillation: Secondary | ICD-10-CM | POA: Diagnosis not present

## 2022-04-22 DIAGNOSIS — E7800 Pure hypercholesterolemia, unspecified: Secondary | ICD-10-CM | POA: Diagnosis not present

## 2022-04-22 LAB — LIPID PANEL
Chol/HDL Ratio: 2.9 ratio (ref 0.0–4.4)
Cholesterol, Total: 129 mg/dL (ref 100–199)
HDL: 44 mg/dL (ref >39)
LDL Chol Calc (NIH): 54 mg/dL (ref 0–99)
Triglycerides: 191 mg/dL — ABNORMAL HIGH (ref 0–149)
VLDL Cholesterol Cal: 31 mg/dL (ref 5–40)

## 2022-04-22 NOTE — Progress Notes (Signed)
Cardiology Office Note:    Date:  04/22/2022   ID:  LEIANNA Mcconnell, DOB 11-21-1960, MRN [id number]  PCP:  Lin Landsman, MD   [facility] Providers Cardiologist:  Janina Mayo, MD     Referring MD: Lin Landsman, MD   No chief complaint on file. Diastolic CHF  History of Present Illness:    [name] is a 62 y.o. female with a hx of DM, HLD, HTN, renal cancer s/p right renal cryoablation (not on TKI), TIA, thyroid CA s/p total thyroidectomy in 2016 on synthroid, atrial fibrillation CHADS2VASC=6 (stroke, gender, DM2, HTN, CHF), diastolic dysfunction, Seen by Afib clinic in April. She has an ILR in place.  She's on eliquis. She is rate controlled. Planning for a sleep study. She has normal LV function, RV function, no significant valve dx. She's on torsemide, prior lasix allergy (noted she received batch with sulfa, and she has a sulfa allergy). She says her feet swell at the end of the day. She takes torsemide 40 mg daily.  She denies PND or orthopnea.  No dyspnea on exertion. She is mainly at home and she has grandchildren to keep her busy 65,60 and 61 years old.    08/26/2021 Weight 226 pounds Wt Readings from Last 3 Encounters:  04/22/22 237 lb 3.2 oz (107.6 kg)  12/17/21 238 lb (108 kg)  08/26/21 226 lb (102.5 kg)    Interim Hx 04/22/2022 She had COVID during Xmas. She notes some persistent chest tightness and SOB. She still has a cough. She has not gone out much. She is staying the house. She is doing less exercise. No LE edema. No PND/orthopnea. Blood pressure is well controlled.   Wt Readings from Last 3 Encounters:  04/22/22 237 lb 3.2 oz (107.6 kg)  12/17/21 238 lb (108 kg)  08/26/21 226 lb (102.5 kg)     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 "   Asthma    due to allergies    Cancer ([place])    THRYOID 2016 [place], KIDNEY CANCER MAY 2018  RIGHT RENAL CRYOABLATION   Complication of anesthesia     Diabetes mellitus    TYPE 2   Family history of adverse reaction to anesthesia    difficult for father to wake after surgery, AFTHER LOW BP WITH ANESTHESIA   HOH (hard of hearing)    Hypercholesteremia    Hypertension    PONV (postoperative nausea and vomiting)    Renal cancer, right ([place]) dx'd 07/2018, 10/2018   ablation x 2    Thyroid disease    TIA (transient ischemic attack)    LAST TIA  APRIL 2020 ON PLAVIX, reports residual effects ,02/2020    Past Surgical History:  Procedure Laterality Date   ABDOMINAL HYSTERECTOMY     COMPLETE   BIOPSY  03/02/2019   Procedure: BIOPSY;  Surgeon: Carol Ada, MD;  Location: [facility];  Service: Endoscopy;;   CHOLECYSTECTOMY     COLON SURGERY     COLONOSCOPY WITH PROPOFOL N/A 03/02/2019   Procedure: COLONOSCOPY WITH PROPOFOL;  Surgeon: Carol Ada, MD;  Location: [facility];  Service: Endoscopy;  Laterality: N/A;   ear surgeries      implantable loop recorder placement  06/02/2020   Medtronic Reveal Linq model [id number] implantable loop recorder (SN [id number])    IR RADIOLOGIST EVAL & MGMT  01/12/2017   IR  RADIOLOGIST EVAL & MGMT  06/16/2017   IR RADIOLOGIST EVAL & MGMT  08/31/2017   IR RADIOLOGIST EVAL & MGMT  12/13/2017   IR RADIOLOGIST EVAL & MGMT  03/16/2018   IR RADIOLOGIST EVAL & MGMT  11/01/2018   IR RADIOLOGIST EVAL & MGMT  12/13/2018   IR RADIOLOGIST EVAL & MGMT  02/15/2019   IR RADIOLOGIST EVAL & MGMT  08/16/2019   IR RADIOLOGIST EVAL & MGMT  11/22/2019   IR RADIOLOGIST EVAL & MGMT  06/05/2020   IR RADIOLOGIST EVAL & MGMT  11/27/2020   IR RADIOLOGIST EVAL & MGMT  11/19/2021   KNEE ARTHROSCOPY     LEFT HEART CATHETERIZATION WITH CORONARY ANGIOGRAM N/A 04/24/2014   Procedure: LEFT HEART CATHETERIZATION WITH CORONARY ANGIOGRAM;  Surgeon: Burnell Blanks, MD;  Location: [facility] CATH LAB;  Service: Cardiovascular;  Laterality: N/A;   POLYPECTOMY  03/02/2019   Procedure: POLYPECTOMY;  Surgeon: Carol Ada, MD;  Location: [facility];   Service: Endoscopy;;   RADIOFREQUENCY ABLATION Right 08/05/2017   Procedure: CT RENAL CRYO AND BIOPSY;  Surgeon: Sandi Mariscal, MD;  Location: [facility];  Service: Anesthesiology;  Laterality: Right;   RADIOFREQUENCY ABLATION Right 11/15/2018   Procedure: RIGHT RENAL CRYOABLATION;  Surgeon: Sandi Mariscal, MD;  Location: [facility];  Service: Anesthesiology;  Laterality: Right;   THYROIDECTOMY N/A 09/20/2014   Procedure: TOTAL THYROIDECTOMY;  Surgeon: Armandina Gemma, MD;  Location: [facility];  Service: General;  Laterality: N/A;   TONSILLECTOMY      Current Medications: Current Meds  Medication Sig   acetaminophen (TYLENOL) 500 MG tablet Take 2 tablets (1,000 mg total) by mouth every 6 (six) hours as needed (body aches).   albuterol (VENTOLIN HFA) 108 (90 Base) MCG/ACT inhaler Inhale 2 puffs every 4 to 6 hours as needed.   apixaban (ELIQUIS) 5 MG TABS tablet Take 1 tablet by mouth 2 times daily.   atorvastatin (LIPITOR) 80 MG tablet Take 1 tablet (80 mg total) by mouth at bedtime.   CALCIUM-MAGNESIUM-ZINC PO Take 2 tablets by mouth 2 (two) times daily.   diclofenac Sodium (VOLTAREN) 1 % GEL Apply to affected area(s) twice daily as directed   diphenhydrAMINE (BENADRYL) 25 MG tablet Take 25 mg by mouth at bedtime.   fluticasone (FLONASE) 50 MCG/ACT nasal spray Place 2 sprays into both nostrils daily.   ibuprofen (ADVIL) 200 MG tablet Take 800 mg by mouth every 8 (eight) hours as needed for headache or moderate pain.   levothyroxine (SYNTHROID) 200 MCG tablet Take 1 tablet by mouth once daily   lisinopril (ZESTRIL) 10 MG tablet Take 1 tablet (10 mg total) by mouth daily.   metFORMIN (GLUCOPHAGE) 500 MG tablet Take 1 tablet (500 mg total) by mouth 2 (two) times daily with a meal.   metoprolol tartrate (LOPRESSOR) 100 MG tablet Take 1 tablet (100 mg total) by mouth 2 (two) times daily.   mupirocin ointment (BACTROBAN) 2 % Apply 1 application topically in the morning and at bedtime. Mole removal site on back    pantoprazole (PROTONIX) 40 MG tablet Take 1 tablet (40 mg total) by mouth daily.   Probiotic Product (PROBIOTIC PO) Take 1 tablet by mouth daily.   Semaglutide,0.25 or 0.'5MG'$ /DOS, (OZEMPIC, 0.25 OR 0.5 MG/DOSE,) 2 MG/1.5ML SOPN Inject 0.5 mg subcutaneously once a week.   spironolactone (ALDACTONE) 25 MG tablet Take 1 tablet by mouth 2 times a day   tobramycin-dexamethasone (TOBRADEX) ophthalmic solution Place 2 drops in the effected eye 4 times a day  zinc gluconate 50 MG tablet Take 50 mg by mouth daily as needed (flu season).     Allergies:   Amoxicillin, Lorabid [loracarbef], Augmentin [amoxicillin-pot clavulanate], Codeine, Furosemide, Gadolinium derivatives, Sulfa drugs cross reactors, Benicar [olmesartan medoxomil], and Other   Social History   Socioeconomic History   Marital status: Married    Spouse name: Glendell Docker   Number of children: 2   Years of education: 14   Highest education level: Not on file  Occupational History    Comment: NA, disabled  Tobacco Use   Smoking status: Never   Smokeless tobacco: Never   Tobacco comments:    Never smoke 07/14/21  Vaping Use   Vaping Use: Never used  Substance and Sexual Activity   Alcohol use: No   Drug use: No   Sexual activity: Not on file  Other Topics Concern   Not on file  Social History Narrative   Lives with husband and son   Social Determinants of Health   Financial Resource Strain: Not on file  Food Insecurity: Not on file  Transportation Needs: Not on file  Physical Activity: Not on file  Stress: Not on file  Social Connections: Not on file     Family History: The patient's family history includes Cancer in her father; Diabetes in her mother; Heart failure in her father and mother; Stroke in her father and mother.  ROS:   Please see the history of present illness.     All other systems reviewed and are negative.  EKGs/Labs/Other Studies Reviewed:    The following studies were reviewed today:   EKG:  EKG  is  ordered today.  The ekg ordered today demonstrates   NSR, 1st degree AV block. Septal q  04/22/2022- NSR, 1st degree AV block, LAD, anteroseptal infarct  Recent Labs: 07/14/2021: BUN 20; Hemoglobin 11.5; Platelets 269; Potassium 4.3; Sodium 138 11/10/2021: Creatinine, Ser 1.10   Recent Lipid Panel    Component Value Date/Time   CHOL 181 10/15/2020 1025   TRIG 311 (H) 10/15/2020 1025   HDL 38 (L) 10/15/2020 1025   CHOLHDL 4.8 10/15/2020 1025   VLDL 62 (H) 10/15/2020 1025   LDLCALC 81 10/15/2020 1025     Risk Assessment/Calculations:    CHA2DS2-VASc Score = 6   This indicates a 9.7% annual risk of stroke. The patient's score is based upon: CHF History: 1 HTN History: 1 Diabetes History: 1 Stroke History: 2 Vascular Disease History: 0 Age Score: 0 Gender Score: 1          Physical Exam:    VS: Vitals:   04/22/22 0853  BP: 118/86  Pulse: 82     Wt Readings from Last 3 Encounters:  04/22/22 237 lb 3.2 oz (107.6 kg)  12/17/21 238 lb (108 kg)  08/26/21 226 lb (102.5 kg)     GEN:  Well nourished, well developed in no acute distress HEENT: Normal NECK: No JVD; No carotid bruits LYMPHATICS: No lymphadenopathy CARDIAC: RRR, no murmurs, rubs, gallops RESPIRATORY:  Clear to auscultation without rales, wheezing or rhonchi  ABDOMEN: Soft, non-tender, non-distended MUSCULOSKELETAL:  No edema; No deformity  SKIN: Warm and dry NEUROLOGIC:  Alert and oriented x 3 PSYCHIATRIC:  Normal affect   ASSESSMENT:    Diastolic CHF:  E/e' 18.  GFR is normal. Crt 1.05. Her dry weight is ~ 226 lbs. She is euvolemic. Will continue torsemide 40 mg daily. Continue spironolactone 25 mg daily. Recommended compression stockings. Can consider jardiance if hospitalized with HFpEF.  HLD: with small vessel ischemic dx on brain MRI and high risk will aim for LDL < 70 mg/dL. Increase atorvastatin to 80 mg daily in May.   Afib: Chads2vasc=6. She's on rate control with metop 100 mg BID.  Continue eliquis 5 mg BID. Managed by afib clinic.  HTN: well controlled. Continue lisinopril 10 mg daily  DM2: A1c goal <7  PLAN:    In order of problems listed above:   Repeat lipids Follow up in 6 months           Medication Adjustments/Labs and Tests Ordered: Current medicines are reviewed at length with the patient today.  Concerns regarding medicines are outlined above.  Orders Placed This Encounter  Procedures   Lipid panel   EKG 12-Lead   No orders of the defined types were placed in this encounter.   Patient Instructions     Follow-Up: At [facility], you and your health needs are our priority.  As part of our continuing mission to provide you with exceptional heart care, we have created designated Provider Care Teams.  These Care Teams include your primary Cardiologist (physician) and Advanced Practice Providers (APPs -  Physician Assistants and Nurse Practitioners) who all work together to provide you with the care you need, when you need it.  We recommend signing up for the patient portal called "MyChart".  Sign up information is provided on this After Visit Summary.  MyChart is used to connect with patients for Virtual Visits (Telemedicine).  Patients are able to view lab/test results, encounter notes, upcoming appointments, etc.  Non-urgent messages can be sent to your provider as well.   To learn more about what you can do with MyChart, go to [email or web address].    Your next appointment:   6 month(s)  Provider:   Janina Mayo, MD        Signed, Janina Mayo, MD  04/22/2022 9:17 AM    [place]

## 2022-04-22 NOTE — Patient Instructions (Signed)
    Follow-Up: At [facility], you and your health needs are our priority.  As part of our continuing mission to provide you with exceptional heart care, we have created designated Provider Care Teams.  These Care Teams include your primary Cardiologist (physician) and Advanced Practice Providers (APPs -  Physician Assistants and Nurse Practitioners) who all work together to provide you with the care you need, when you need it.  We recommend signing up for the patient portal called "MyChart".  Sign up information is provided on this After Visit Summary.  MyChart is used to connect with patients for Virtual Visits (Telemedicine).  Patients are able to view lab/test results, encounter notes, upcoming appointments, etc.  Non-urgent messages can be sent to your provider as well.   To learn more about what you can do with MyChart, go to [email or web address].    Your next appointment:   6 month(s)  Provider:   Janina Mayo, MD

## 2022-04-26 NOTE — Progress Notes (Signed)
Carelink Summary Report / Loop Recorder

## 2022-05-03 ENCOUNTER — Other Ambulatory Visit (HOSPITAL_COMMUNITY): Payer: Self-pay

## 2022-05-03 ENCOUNTER — Other Ambulatory Visit: Payer: Self-pay

## 2022-05-03 ENCOUNTER — Ambulatory Visit: Payer: Medicare PPO

## 2022-05-03 DIAGNOSIS — G459 Transient cerebral ischemic attack, unspecified: Secondary | ICD-10-CM

## 2022-05-04 ENCOUNTER — Other Ambulatory Visit (HOSPITAL_COMMUNITY): Payer: Self-pay

## 2022-05-04 LAB — CUP PACEART REMOTE DEVICE CHECK
Date Time Interrogation Session: 20240204231055
Implantable Pulse Generator Implant Date: 20220307

## 2022-05-04 MED ORDER — METFORMIN HCL 500 MG PO TABS
500.0000 mg | ORAL_TABLET | Freq: Two times a day (BID) | ORAL | 2 refills | Status: DC
  Filled 2022-05-04: qty 60, 30d supply, fill #0
  Filled 2022-05-31: qty 60, 30d supply, fill #1
  Filled 2022-07-01: qty 60, 30d supply, fill #2

## 2022-05-04 MED ORDER — SPIRONOLACTONE 25 MG PO TABS
25.0000 mg | ORAL_TABLET | Freq: Two times a day (BID) | ORAL | 2 refills | Status: DC
  Filled 2022-05-04: qty 60, 30d supply, fill #0
  Filled 2022-06-25: qty 60, 30d supply, fill #1
  Filled 2022-08-17: qty 60, 30d supply, fill #2

## 2022-05-06 ENCOUNTER — Encounter (HOSPITAL_COMMUNITY): Payer: Self-pay | Admitting: *Deleted

## 2022-05-18 ENCOUNTER — Telehealth: Payer: Self-pay

## 2022-05-18 ENCOUNTER — Other Ambulatory Visit (HOSPITAL_COMMUNITY): Payer: Self-pay

## 2022-05-18 NOTE — Telephone Encounter (Signed)
Overall AF burden is very low. 0.1% Already on [facility] Last transmission showed she was in [place]. No changes at this time. Follow up with AF clinic in April per recall.

## 2022-05-18 NOTE — Telephone Encounter (Signed)
ILR alert report received. Battery status OK. Normal device function.  No new symptom, tachy, brady, or pause episodes.   Seven new AF episodes. AF burden is 1.7% of the time.  On [place] according previous reports.  Presenting rhythm appears to be AF.  Sent to triage.  Monthly summary reports and ROV/PRN Kathy Breach, RN, CCDS, [employer]  Patient is being managed by AF clinic.  Will forward for their review and follow up.  Appears most occur on 2/16.

## 2022-05-21 ENCOUNTER — Other Ambulatory Visit: Payer: Self-pay | Admitting: Adult Health

## 2022-05-21 ENCOUNTER — Other Ambulatory Visit: Payer: Self-pay

## 2022-05-21 ENCOUNTER — Other Ambulatory Visit (HOSPITAL_COMMUNITY): Payer: Self-pay

## 2022-05-24 ENCOUNTER — Other Ambulatory Visit: Payer: Self-pay

## 2022-05-24 MED ORDER — PANTOPRAZOLE SODIUM 40 MG PO TBEC
40.0000 mg | DELAYED_RELEASE_TABLET | Freq: Every day | ORAL | 1 refills | Status: DC
  Filled 2022-05-24: qty 90, 90d supply, fill #0
  Filled 2022-08-17: qty 90, 90d supply, fill #1

## 2022-06-01 ENCOUNTER — Other Ambulatory Visit: Payer: Self-pay

## 2022-06-01 ENCOUNTER — Other Ambulatory Visit (HOSPITAL_COMMUNITY): Payer: Self-pay

## 2022-06-07 ENCOUNTER — Ambulatory Visit (INDEPENDENT_AMBULATORY_CARE_PROVIDER_SITE_OTHER): Payer: Medicare PPO

## 2022-06-07 DIAGNOSIS — G459 Transient cerebral ischemic attack, unspecified: Secondary | ICD-10-CM

## 2022-06-09 ENCOUNTER — Other Ambulatory Visit (HOSPITAL_COMMUNITY): Payer: Self-pay

## 2022-06-09 LAB — CUP PACEART REMOTE DEVICE CHECK
Date Time Interrogation Session: 20240310230152
Implantable Pulse Generator Implant Date: 20220307

## 2022-06-10 ENCOUNTER — Other Ambulatory Visit (HOSPITAL_COMMUNITY): Payer: Self-pay

## 2022-06-11 ENCOUNTER — Other Ambulatory Visit (HOSPITAL_COMMUNITY): Payer: Self-pay

## 2022-06-11 ENCOUNTER — Other Ambulatory Visit: Payer: Self-pay

## 2022-06-11 MED ORDER — SEMAGLUTIDE(0.25 OR 0.5MG/DOS) 2 MG/3ML ~~LOC~~ SOPN
0.5000 mg | PEN_INJECTOR | SUBCUTANEOUS | 2 refills | Status: DC
  Filled 2022-06-11: qty 3, 28d supply, fill #0
  Filled 2022-07-20: qty 3, 28d supply, fill #1
  Filled 2022-08-17: qty 3, 28d supply, fill #2

## 2022-06-17 NOTE — Progress Notes (Signed)
Carelink Summary Report / Loop Recorder 

## 2022-06-25 ENCOUNTER — Other Ambulatory Visit (HOSPITAL_COMMUNITY): Payer: Self-pay

## 2022-07-01 ENCOUNTER — Other Ambulatory Visit (HOSPITAL_COMMUNITY): Payer: Self-pay

## 2022-07-12 ENCOUNTER — Ambulatory Visit (INDEPENDENT_AMBULATORY_CARE_PROVIDER_SITE_OTHER): Payer: Medicare PPO

## 2022-07-12 DIAGNOSIS — G459 Transient cerebral ischemic attack, unspecified: Secondary | ICD-10-CM | POA: Diagnosis not present

## 2022-07-12 LAB — CUP PACEART REMOTE DEVICE CHECK
Date Time Interrogation Session: 20240414230324
Implantable Pulse Generator Implant Date: 20220307

## 2022-07-15 NOTE — Progress Notes (Signed)
Carelink Summary Report / Loop Recorder 

## 2022-07-19 ENCOUNTER — Other Ambulatory Visit (HOSPITAL_COMMUNITY): Payer: Self-pay

## 2022-07-19 MED ORDER — LEVOTHYROXINE SODIUM 200 MCG PO TABS
200.0000 ug | ORAL_TABLET | Freq: Every day | ORAL | 3 refills | Status: DC
  Filled 2022-07-19 – 2022-09-22 (×3): qty 90, 90d supply, fill #0
  Filled 2022-12-20: qty 90, 90d supply, fill #1
  Filled 2023-03-18: qty 90, 90d supply, fill #2
  Filled 2023-06-14: qty 90, 90d supply, fill #3

## 2022-07-20 ENCOUNTER — Other Ambulatory Visit (HOSPITAL_COMMUNITY): Payer: Self-pay

## 2022-07-24 ENCOUNTER — Other Ambulatory Visit (HOSPITAL_COMMUNITY): Payer: Self-pay

## 2022-08-03 ENCOUNTER — Other Ambulatory Visit (HOSPITAL_COMMUNITY): Payer: Self-pay

## 2022-08-03 ENCOUNTER — Other Ambulatory Visit (HOSPITAL_COMMUNITY): Payer: Self-pay | Admitting: Physician Assistant

## 2022-08-03 ENCOUNTER — Other Ambulatory Visit: Payer: Self-pay

## 2022-08-03 MED ORDER — APIXABAN 5 MG PO TABS
5.0000 mg | ORAL_TABLET | Freq: Two times a day (BID) | ORAL | 3 refills | Status: DC
  Filled 2022-08-03: qty 60, 30d supply, fill #0
  Filled 2022-09-01: qty 60, 30d supply, fill #1
  Filled 2022-09-28: qty 60, 30d supply, fill #2
  Filled 2022-11-08: qty 60, 30d supply, fill #3

## 2022-08-04 ENCOUNTER — Other Ambulatory Visit (HOSPITAL_COMMUNITY): Payer: Self-pay

## 2022-08-04 ENCOUNTER — Other Ambulatory Visit (HOSPITAL_BASED_OUTPATIENT_CLINIC_OR_DEPARTMENT_OTHER): Payer: Self-pay

## 2022-08-04 MED ORDER — METFORMIN HCL 500 MG PO TABS
500.0000 mg | ORAL_TABLET | Freq: Two times a day (BID) | ORAL | 2 refills | Status: DC
  Filled 2022-08-04: qty 60, 30d supply, fill #0
  Filled 2022-09-01: qty 60, 30d supply, fill #1
  Filled 2022-09-28: qty 60, 30d supply, fill #2

## 2022-08-16 ENCOUNTER — Ambulatory Visit (INDEPENDENT_AMBULATORY_CARE_PROVIDER_SITE_OTHER): Payer: Medicare PPO

## 2022-08-16 DIAGNOSIS — G459 Transient cerebral ischemic attack, unspecified: Secondary | ICD-10-CM

## 2022-08-16 LAB — CUP PACEART REMOTE DEVICE CHECK
Date Time Interrogation Session: 20240517230014
Implantable Pulse Generator Implant Date: 20220307

## 2022-08-16 NOTE — Progress Notes (Signed)
Carelink Summary Report / Loop Recorder 

## 2022-08-17 ENCOUNTER — Other Ambulatory Visit (HOSPITAL_COMMUNITY): Payer: Self-pay

## 2022-08-17 ENCOUNTER — Other Ambulatory Visit: Payer: Self-pay

## 2022-08-18 ENCOUNTER — Other Ambulatory Visit (HOSPITAL_COMMUNITY): Payer: Self-pay

## 2022-08-19 ENCOUNTER — Other Ambulatory Visit (HOSPITAL_COMMUNITY): Payer: Self-pay

## 2022-08-19 MED ORDER — LISINOPRIL 10 MG PO TABS
10.0000 mg | ORAL_TABLET | Freq: Every day | ORAL | 1 refills | Status: DC
  Filled 2022-08-19: qty 90, 90d supply, fill #0
  Filled 2022-12-01: qty 90, 90d supply, fill #1

## 2022-09-01 ENCOUNTER — Other Ambulatory Visit (HOSPITAL_COMMUNITY): Payer: Self-pay

## 2022-09-13 ENCOUNTER — Other Ambulatory Visit (HOSPITAL_COMMUNITY): Payer: Self-pay

## 2022-09-13 NOTE — Progress Notes (Signed)
Carelink Summary Report / Loop Recorder 

## 2022-09-16 ENCOUNTER — Other Ambulatory Visit (HOSPITAL_COMMUNITY): Payer: Self-pay

## 2022-09-17 ENCOUNTER — Other Ambulatory Visit (HOSPITAL_COMMUNITY): Payer: Self-pay

## 2022-09-18 ENCOUNTER — Other Ambulatory Visit (HOSPITAL_COMMUNITY): Payer: Self-pay

## 2022-09-20 ENCOUNTER — Other Ambulatory Visit (HOSPITAL_COMMUNITY): Payer: Self-pay

## 2022-09-20 ENCOUNTER — Ambulatory Visit (INDEPENDENT_AMBULATORY_CARE_PROVIDER_SITE_OTHER): Payer: Medicare PPO

## 2022-09-20 DIAGNOSIS — G459 Transient cerebral ischemic attack, unspecified: Secondary | ICD-10-CM | POA: Diagnosis not present

## 2022-09-20 LAB — CUP PACEART REMOTE DEVICE CHECK
Date Time Interrogation Session: 20240623230334
Implantable Pulse Generator Implant Date: 20220307

## 2022-09-20 MED ORDER — ZOSTER VAC RECOMB ADJUVANTED 50 MCG/0.5ML IM SUSR
INTRAMUSCULAR | 1 refills | Status: DC
  Filled 2022-09-20 – 2022-09-22 (×2): qty 0.5, 1d supply, fill #0
  Filled 2022-11-16: qty 0.5, 1d supply, fill #1

## 2022-09-21 ENCOUNTER — Other Ambulatory Visit (HOSPITAL_COMMUNITY): Payer: Self-pay

## 2022-09-21 ENCOUNTER — Other Ambulatory Visit: Payer: Self-pay

## 2022-09-21 MED ORDER — OZEMPIC (1 MG/DOSE) 4 MG/3ML ~~LOC~~ SOPN
1.0000 mg | PEN_INJECTOR | SUBCUTANEOUS | 1 refills | Status: DC
  Filled 2022-09-21: qty 3, 28d supply, fill #0
  Filled 2022-10-19: qty 3, 28d supply, fill #1

## 2022-09-22 ENCOUNTER — Other Ambulatory Visit (HOSPITAL_COMMUNITY): Payer: Self-pay

## 2022-09-22 ENCOUNTER — Other Ambulatory Visit: Payer: Self-pay

## 2022-09-23 ENCOUNTER — Other Ambulatory Visit (HOSPITAL_COMMUNITY): Payer: Self-pay

## 2022-09-23 ENCOUNTER — Other Ambulatory Visit: Payer: Self-pay

## 2022-09-28 ENCOUNTER — Other Ambulatory Visit (HOSPITAL_COMMUNITY): Payer: Self-pay | Admitting: Physician Assistant

## 2022-09-28 ENCOUNTER — Other Ambulatory Visit (HOSPITAL_COMMUNITY): Payer: Self-pay

## 2022-09-29 ENCOUNTER — Other Ambulatory Visit: Payer: Self-pay

## 2022-09-29 MED ORDER — METOPROLOL TARTRATE 100 MG PO TABS
100.0000 mg | ORAL_TABLET | Freq: Two times a day (BID) | ORAL | 9 refills | Status: DC
  Filled 2022-09-29: qty 60, 30d supply, fill #0
  Filled 2022-11-08: qty 60, 30d supply, fill #1
  Filled 2022-12-03: qty 60, 30d supply, fill #2
  Filled 2023-01-01: qty 60, 30d supply, fill #3
  Filled 2023-02-01: qty 60, 30d supply, fill #4
  Filled 2023-02-27: qty 60, 30d supply, fill #5
  Filled 2023-03-29: qty 60, 30d supply, fill #6
  Filled 2023-05-01: qty 60, 30d supply, fill #7
  Filled 2023-05-30: qty 60, 30d supply, fill #8
  Filled 2023-06-27: qty 60, 30d supply, fill #9

## 2022-10-01 ENCOUNTER — Other Ambulatory Visit (HOSPITAL_COMMUNITY): Payer: Self-pay

## 2022-10-07 ENCOUNTER — Other Ambulatory Visit (HOSPITAL_COMMUNITY): Payer: Self-pay

## 2022-10-07 ENCOUNTER — Other Ambulatory Visit: Payer: Self-pay

## 2022-10-07 MED ORDER — FREESTYLE LIBRE 3 SENSOR MISC
1 refills | Status: DC
  Filled 2022-10-07: qty 6, 84d supply, fill #0
  Filled 2022-12-23: qty 6, 84d supply, fill #1

## 2022-10-08 NOTE — Progress Notes (Signed)
Carelink Summary Report / Loop Recorder 

## 2022-10-11 ENCOUNTER — Other Ambulatory Visit (HOSPITAL_COMMUNITY): Payer: Self-pay

## 2022-10-12 ENCOUNTER — Other Ambulatory Visit (HOSPITAL_COMMUNITY): Payer: Self-pay

## 2022-10-19 ENCOUNTER — Other Ambulatory Visit (HOSPITAL_COMMUNITY): Payer: Self-pay

## 2022-10-25 ENCOUNTER — Ambulatory Visit: Payer: Medicare PPO

## 2022-10-25 DIAGNOSIS — G459 Transient cerebral ischemic attack, unspecified: Secondary | ICD-10-CM

## 2022-10-28 ENCOUNTER — Other Ambulatory Visit: Payer: Self-pay

## 2022-10-28 ENCOUNTER — Other Ambulatory Visit (HOSPITAL_COMMUNITY): Payer: Self-pay

## 2022-10-28 MED ORDER — SPIRONOLACTONE 25 MG PO TABS
25.0000 mg | ORAL_TABLET | Freq: Two times a day (BID) | ORAL | 2 refills | Status: DC
  Filled 2022-10-28: qty 60, 30d supply, fill #0
  Filled 2022-12-20: qty 60, 30d supply, fill #1
  Filled 2023-01-25: qty 60, 30d supply, fill #2

## 2022-10-29 ENCOUNTER — Other Ambulatory Visit: Payer: Self-pay

## 2022-11-02 ENCOUNTER — Other Ambulatory Visit (HOSPITAL_COMMUNITY): Payer: Self-pay

## 2022-11-02 ENCOUNTER — Other Ambulatory Visit: Payer: Self-pay | Admitting: Adult Health

## 2022-11-02 ENCOUNTER — Other Ambulatory Visit: Payer: Self-pay

## 2022-11-02 MED ORDER — PANTOPRAZOLE SODIUM 40 MG PO TBEC
40.0000 mg | DELAYED_RELEASE_TABLET | Freq: Every day | ORAL | 0 refills | Status: DC
  Filled 2022-11-02: qty 90, 90d supply, fill #0

## 2022-11-02 MED ORDER — METFORMIN HCL 500 MG PO TABS
500.0000 mg | ORAL_TABLET | Freq: Two times a day (BID) | ORAL | 2 refills | Status: DC
  Filled 2022-11-02: qty 60, 30d supply, fill #0
  Filled 2022-12-01: qty 60, 30d supply, fill #1
  Filled 2022-12-28: qty 60, 30d supply, fill #2

## 2022-11-03 ENCOUNTER — Other Ambulatory Visit: Payer: Self-pay

## 2022-11-03 ENCOUNTER — Other Ambulatory Visit (HOSPITAL_COMMUNITY): Payer: Self-pay

## 2022-11-03 MED ORDER — SPIRONOLACTONE 25 MG PO TABS
25.0000 mg | ORAL_TABLET | Freq: Two times a day (BID) | ORAL | 2 refills | Status: DC
  Filled 2023-03-02: qty 60, 30d supply, fill #0
  Filled 2023-05-26: qty 60, 30d supply, fill #1
  Filled 2023-07-30: qty 60, 30d supply, fill #2

## 2022-11-06 ENCOUNTER — Ambulatory Visit
Admission: RE | Admit: 2022-11-06 | Discharge: 2022-11-06 | Disposition: A | Discharge status: Home / Self Care | Payer: Medicare PPO | Source: Ambulatory Visit | Attending: Emergency Medicine | Admitting: Emergency Medicine

## 2022-11-06 VITALS — BP 135/83 | HR 89 | Temp 98.9°F | Resp 18

## 2022-11-06 DIAGNOSIS — K047 Periapical abscess without sinus: Secondary | ICD-10-CM

## 2022-11-06 DIAGNOSIS — K029 Dental caries, unspecified: Secondary | ICD-10-CM | POA: Diagnosis not present

## 2022-11-06 MED ORDER — CLINDAMYCIN HCL 300 MG PO CAPS: 300.0000 mg | ORAL_CAPSULE | Freq: Three times a day (TID) | ORAL | 0 refills | Status: DC

## 2022-11-06 NOTE — ED Provider Notes (Signed)
[name]    CSN: [id number] Arrival date & time: 11/06/22  1142      History   Chief Complaint Chief Complaint  Patient presents with   Abscess    Abscess tooth - Entered by patient    HPI [name] is a 62 y.o. female.  Accompanied by her husband, patient presents with 1 day history of pain and swelling of her right lower teeth and jaw.  No fever, chills, rash, difficulty swallowing, voice change, difficulty breathing, or other symptoms.  No OTC medications taken today.  Her medical history includes diabetes, hypertension, atrial fibrillation, TIA, kidney cancer, thyroid cancer.  Patient is on Eliquis.   The history is provided by the patient, the spouse and medical records.    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 "   Asthma    due to allergies    Cancer (HCC)    THRYOID 2016 TX WITH SURGERY, KIDNEY CANCER MAY 2018  RIGHT RENAL CRYOABLATION   Complication of anesthesia    Diabetes mellitus    TYPE 2   Family history of adverse reaction to anesthesia    difficult for father to wake after surgery, AFTHER LOW BP WITH ANESTHESIA   HOH (hard of hearing)    Hypercholesteremia    Hypertension    PONV (postoperative nausea and vomiting)    Renal cancer, right (HCC) dx'd 07/2018, 10/2018   ablation x 2    Thyroid disease    TIA (transient ischemic attack)    LAST TIA  APRIL 2020 ON PLAVIX, reports residual effects ,02/2020    Patient Active Problem List   Diagnosis Date Noted   PAF (paroxysmal atrial fibrillation) (HCC) 06/10/2021   Secondary hypercoagulable state (HCC) 06/10/2021   Left-sided weakness    TIA (transient ischemic attack) 03/09/2020   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HCC) 11/15/2018   Papillary adenocarcinoma, renal, 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   UTI (urinary tract infection) 06/26/2017   Urinary tract infection 01/24/2017   Abdominal pain 01/21/2017   Stroke-like symptoms 05/16/2016    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 05/16/2016   Fatty liver disease, 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/16/2016   Slurred speech 09/13/2015   Parotitis 09/13/2015   Gastroenteritis 08/21/2015   Hypothyroid 08/21/2015   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   Pain in the chest    Essential hypertension    Chest pain 04/23/2014   Anginal pain (HCC) 04/23/2014   Type 2 diabetes mellitus (HCC)    Hypertension    Hypercholesteremia     Past Surgical History:  Procedure Laterality Date   ABDOMINAL HYSTERECTOMY     COMPLETE   BIOPSY  03/02/2019   Procedure: BIOPSY;  Surgeon: Jeani Hawking, MD;  Location: [facility];  Service: Endoscopy;;   CHOLECYSTECTOMY     COLON SURGERY     COLONOSCOPY WITH PROPOFOL N/A 03/02/2019   Procedure: COLONOSCOPY WITH PROPOFOL;  Surgeon: Jeani Hawking, MD;  Location: [facility];  Service: Endoscopy;  Laterality: N/A;   ear surgeries      implantable loop recorder placement  06/02/2020   Medtronic Reveal Linq model [id number] implantable loop recorder (SN [id number])    IR RADIOLOGIST EVAL & MGMT  01/12/2017   IR RADIOLOGIST EVAL & MGMT  06/16/2017   IR RADIOLOGIST EVAL & MGMT  08/31/2017   IR RADIOLOGIST EVAL & MGMT  12/13/2017  IR RADIOLOGIST EVAL & MGMT  03/16/2018   IR RADIOLOGIST EVAL & MGMT  11/01/2018   IR RADIOLOGIST EVAL & MGMT  12/13/2018   IR RADIOLOGIST EVAL & MGMT  02/15/2019   IR RADIOLOGIST EVAL & MGMT  08/16/2019   IR RADIOLOGIST EVAL & MGMT  11/22/2019   IR RADIOLOGIST EVAL & MGMT  06/05/2020   IR RADIOLOGIST EVAL & MGMT  11/27/2020   IR RADIOLOGIST EVAL & MGMT  11/19/2021   KNEE ARTHROSCOPY     LEFT HEART CATHETERIZATION WITH CORONARY ANGIOGRAM N/A 04/24/2014   Procedure: LEFT HEART CATHETERIZATION WITH CORONARY ANGIOGRAM;  Surgeon: Kathleene Hazel, MD;  Location: [facility];  Service: Cardiovascular;  Laterality: N/A;   POLYPECTOMY  03/02/2019   Procedure: POLYPECTOMY;  Surgeon: Jeani Hawking,  MD;  Location: [facility];  Service: Endoscopy;;   RADIOFREQUENCY ABLATION Right 08/05/2017   Procedure: CT RENAL CRYO AND BIOPSY;  Surgeon: Simonne Come, MD;  Location: [facility];  Service: Anesthesiology;  Laterality: Right;   RADIOFREQUENCY ABLATION Right 11/15/2018   Procedure: RIGHT RENAL CRYOABLATION;  Surgeon: Simonne Come, MD;  Location: [facility];  Service: Anesthesiology;  Laterality: Right;   THYROIDECTOMY N/A 09/20/2014   Procedure: TOTAL THYROIDECTOMY;  Surgeon: Darnell Level, MD;  Location: [facility];  Service: General;  Laterality: N/A;   TONSILLECTOMY      OB History   No obstetric history on file.      Home Medications    Prior to Admission medications   Medication Sig Start Date End Date Taking? Authorizing Provider  clindamycin (CLEOCIN) 300 MG capsule Take 1 capsule (300 mg total) by mouth 3 (three) times daily. 11/06/22  Yes Mickie Bail, NP  acetaminophen (TYLENOL) 500 MG tablet Take 2 tablets (1,000 mg total) by mouth every 6 (six) hours as needed (body aches). 06/29/17   Joseph Art, DO  albuterol (VENTOLIN HFA) 108 (90 Base) MCG/ACT inhaler Inhale 2 puffs every 4 to 6 hours as needed. 12/22/20   Leilani Able, MD  apixaban (ELIQUIS) 5 MG TABS tablet Take 1 tablet by mouth 2 times daily. 08/03/22   Fenton, Clint R, PA  atorvastatin (LIPITOR) 80 MG tablet Take 1 tablet (80 mg total) by mouth at bedtime. 02/26/22   Maisie Fus, MD  CALCIUM-MAGNESIUM-ZINC PO Take 2 tablets by mouth 2 (two) times daily.    [provider]  Continuous Glucose Sensor (FREESTYLE LIBRE 3 SENSOR) MISC Change sensor every two weeks 10/07/22     diclofenac Sodium (VOLTAREN) 1 % GEL Apply to affected area(s) twice daily as directed 10/16/20   Adron Bene, MD  diphenhydrAMINE (BENADRYL) 25 MG tablet Take 25 mg by mouth at bedtime.    [provider]  fluticasone (FLONASE) 50 MCG/ACT nasal spray Place 2 sprays into both nostrils daily. 03/04/20   [provider]  ibuprofen  (ADVIL) 200 MG tablet Take 800 mg by mouth every 8 (eight) hours as needed for headache or moderate pain.    [provider]  levothyroxine (SYNTHROID) 200 MCG tablet Take 1 tablet by mouth once daily 09/08/21   Leilani Able, MD  levothyroxine (SYNTHROID) 200 MCG tablet Take 1 tablet (200 mcg total) by mouth daily. 07/19/22     lisinopril (ZESTRIL) 10 MG tablet Take 1 tablet (10 mg total) by mouth daily. 08/19/22     metFORMIN (GLUCOPHAGE) 500 MG tablet Take 1 tablet (500 mg total) by mouth 2 (two) times daily with a meal. 11/02/22     metoprolol tartrate (  LOPRESSOR) 100 MG tablet Take 1 tablet (100 mg total) by mouth 2 (two) times daily. Patient not taking: Reported on 04/22/2022 11/06/21     metoprolol tartrate (LOPRESSOR) 100 MG tablet Take 1 tablet (100 mg total) by mouth 2 (two) times daily. 09/29/22   Maisie Fus, MD  mupirocin ointment (BACTROBAN) 2 % Apply 1 application topically in the morning and at bedtime. Mole removal site on back 09/16/20   [provider]  ondansetron (ZOFRAN) 8 MG tablet TAKE 1 TABLET BY MOUTH 3 TIMES DAILY Patient not taking: Reported on 04/22/2022 05/25/21     pantoprazole (PROTONIX) 40 MG tablet Take 1 tablet (40 mg total) by mouth daily. 11/02/22   Ihor Austin, NP  Probiotic Product (PROBIOTIC PO) Take 1 tablet by mouth daily.    [provider]  Semaglutide, 1 MG/DOSE, (OZEMPIC, 1 MG/DOSE,) 4 MG/3ML SOPN Inject 1 mg into the skin once a week. 09/20/22     Semaglutide,0.25 or 0.5MG /DOS, (OZEMPIC, 0.25 OR 0.5 MG/DOSE,) 2 MG/1.5ML SOPN Inject 0.5 mg subcutaneously once a week. 04/15/21     Semaglutide,0.25 or 0.5MG /DOS, (OZEMPIC, 0.25 OR 0.5 MG/DOSE,) 2 MG/3ML SOPN Inject 0.5 mg subcutaneously once a week Patient not taking: Reported on 04/22/2022 08/31/21     Semaglutide,0.25 or 0.5MG /DOS, (OZEMPIC, 0.25 OR 0.5 MG/DOSE,) 2 MG/3ML SOPN Inject 0.5 mg into the skin once a week Patient not taking: Reported on 04/22/2022 11/06/21     Semaglutide,0.25 or  0.5MG /DOS, (OZEMPIC, 0.25 OR 0.5 MG/DOSE,) 2 MG/3ML SOPN Inject 0.5 mg into the skin once a week. 06/11/22     spironolactone (ALDACTONE) 25 MG tablet Take 1 tablet by mouth 2 times a day 10/02/21     spironolactone (ALDACTONE) 25 MG tablet Take 1 tablet (25 mg total) by mouth 2 (two) times daily. 10/28/22     spironolactone (ALDACTONE) 25 MG tablet Take 1 tablet (25 mg total) by mouth 2 (two) times daily. 11/03/22     tobramycin-dexamethasone (TOBRADEX) ophthalmic solution Place 2 drops in the effected eye 4 times a day 09/08/21     Torsemide 40 MG TABS Take 1 tablet by mouth in the morning and at bedtime for 10 days. Patient not taking: Reported on 04/22/2022 03/25/21 04/04/21  Gailen Shelter, PA  zinc gluconate 50 MG tablet Take 50 mg by mouth daily as needed (flu season).    [provider]  Zoster Vaccine Adjuvanted [facility]) injection Inject into the muscle. 09/16/22   Leilani Able, MD    Family History Family History  Problem Relation Age of Onset   Heart failure Mother    Diabetes Mother    Stroke Mother    Stroke Father    Heart failure Father    Cancer Father     Social History Social History   Tobacco Use   Smoking status: Never   Smokeless tobacco: Never   Tobacco comments:    Never smoke 07/14/21  Vaping Use   Vaping status: Never Used  Substance Use Topics   Alcohol use: No   Drug use: No     Allergies   Amoxicillin, Lorabid [loracarbef], Augmentin [amoxicillin-pot clavulanate], Codeine, Furosemide, Gadolinium derivatives, Sulfa drugs cross reactors, Benicar [olmesartan medoxomil], and Other   Review of Systems Review of Systems  Constitutional:  Negative for chills and fever.  HENT:  Positive for dental problem and facial swelling. Negative for sore throat, trouble swallowing and voice change.   Respiratory:  Negative for cough and shortness of breath.   Skin:  Negative for color change and rash.     Physical Exam Triage Vital Signs ED Triage Vitals   Encounter Vitals Group     BP      Systolic BP Percentile      Diastolic BP Percentile      Pulse      Resp      Temp      Temp src      SpO2      Weight      Height      Head Circumference      Peak Flow      Pain Score      Pain Loc      Pain Education      Exclude from Growth Chart    No data found.  Updated Vital Signs BP 135/83   Pulse 89   Temp 98.9 F (37.2 C)   Resp 18   SpO2 96%   Visual Acuity Right Eye Distance:   Left Eye Distance:   Bilateral Distance:    Right Eye Near:   Left Eye Near:    Bilateral Near:     Physical Exam Vitals and nursing note reviewed.  Constitutional:      General: She is not in acute distress.    Appearance: She is well-developed.  HENT:     Mouth/Throat:     Mouth: Mucous membranes are moist.     Dentition: Abnormal dentition. Dental tenderness, dental caries and dental abscesses present.     Pharynx: Oropharynx is clear.     Comments: Teeth in poor repair.  Tender swelling of right lower jaw and teeth.  Cardiovascular:     Rate and Rhythm: Normal rate and regular rhythm.     Heart sounds: Normal heart sounds.  Pulmonary:     Effort: Pulmonary effort is normal. No respiratory distress.     Breath sounds: Normal breath sounds.  Musculoskeletal:     Cervical back: Neck supple.  Skin:    General: Skin is warm and dry.     Findings: No erythema or lesion.  Neurological:     Mental Status: She is alert.      UC Treatments / Results  Labs (all labs ordered are listed, but only abnormal results are displayed) Labs Reviewed - No data to display  EKG   Radiology No results found.  Procedures Procedures (including critical care time)  Medications Ordered in UC Medications - No data to display  Initial Impression / Assessment and Plan / UC Course  I have reviewed the triage vital signs and the nursing notes.  Pertinent labs & imaging results that were available during my care of the patient were reviewed  by me and considered in my medical decision making (see chart for details).    Dental abscess, pain due to dental caries.  No difficulty swallowing or breathing.  Afebrile and vital signs are stable.  Patient is allergic to penicillin.  Treating today with clindamycin.  Dental resource guide provided.  Instructed patient to schedule an appointment with a dentist as soon as possible.  ED precautions given.  Education provided on dental abscess.  She agrees to plan of care.  Final Clinical Impressions(s) / UC Diagnoses   Final diagnoses:  Dental abscess  Pain due to dental caries     Discharge Instructions      Take the antibiotic as prescribed.    A dental resource guide is attached.  Please call to make an appointment  with a dentist as soon as possible.    Go to the emergency department if you have acute worsening symptoms.         ED Prescriptions     Medication Sig Dispense Auth. Provider   clindamycin (CLEOCIN) 300 MG capsule Take 1 capsule (300 mg total) by mouth 3 (three) times daily. 21 capsule Mickie Bail, NP      PDMP not reviewed this encounter.   Mickie Bail, NP 11/06/22 1231

## 2022-11-06 NOTE — Discharge Instructions (Addendum)
Take the antibiotic as prescribed.    A dental resource guide is attached.  Please call to make an appointment with a dentist as soon as possible.    Go to the emergency department if you have acute worsening symptoms.     

## 2022-11-06 NOTE — ED Triage Notes (Addendum)
Patient to Urgent Care with complaints of dental abscess present to her right lower mouth. Large area of swelling. No drainage. Rinsing with mouthwash.  Symptoms started yesterday evening.

## 2022-11-08 ENCOUNTER — Other Ambulatory Visit (HOSPITAL_COMMUNITY): Payer: Self-pay

## 2022-11-10 NOTE — Progress Notes (Signed)
Carelink Summary Report / Loop Recorder 

## 2022-11-18 ENCOUNTER — Other Ambulatory Visit: Payer: Self-pay

## 2022-11-18 ENCOUNTER — Other Ambulatory Visit (HOSPITAL_COMMUNITY): Payer: Self-pay

## 2022-11-18 MED ORDER — OZEMPIC (1 MG/DOSE) 4 MG/3ML ~~LOC~~ SOPN
1.0000 mg | PEN_INJECTOR | SUBCUTANEOUS | 2 refills | Status: DC
  Filled 2022-11-18 – 2022-11-19 (×3): qty 3, 28d supply, fill #0
  Filled 2022-12-15: qty 3, 28d supply, fill #1
  Filled 2023-01-10: qty 3, 28d supply, fill #2

## 2022-11-19 ENCOUNTER — Other Ambulatory Visit: Payer: Self-pay

## 2022-11-19 ENCOUNTER — Other Ambulatory Visit (HOSPITAL_COMMUNITY): Payer: Self-pay

## 2022-11-22 ENCOUNTER — Other Ambulatory Visit: Payer: Self-pay

## 2022-11-23 ENCOUNTER — Other Ambulatory Visit: Payer: Self-pay

## 2022-11-24 ENCOUNTER — Ambulatory Visit (INDEPENDENT_AMBULATORY_CARE_PROVIDER_SITE_OTHER): Payer: Medicare PPO

## 2022-11-24 DIAGNOSIS — G459 Transient cerebral ischemic attack, unspecified: Secondary | ICD-10-CM | POA: Diagnosis not present

## 2022-11-25 LAB — CUP PACEART REMOTE DEVICE CHECK
Date Time Interrogation Session: 20240828230208
Implantable Pulse Generator Implant Date: 20220307

## 2022-12-01 ENCOUNTER — Other Ambulatory Visit (HOSPITAL_COMMUNITY): Payer: Self-pay

## 2022-12-03 ENCOUNTER — Other Ambulatory Visit (HOSPITAL_COMMUNITY): Payer: Self-pay

## 2022-12-03 ENCOUNTER — Other Ambulatory Visit (HOSPITAL_COMMUNITY): Payer: Self-pay | Admitting: Physician Assistant

## 2022-12-03 MED ORDER — APIXABAN 5 MG PO TABS
5.0000 mg | ORAL_TABLET | Freq: Two times a day (BID) | ORAL | 0 refills | Status: DC
  Filled 2022-12-03: qty 60, 30d supply, fill #0

## 2022-12-03 NOTE — Progress Notes (Signed)
Carelink Summary Report / Loop Recorder 

## 2022-12-04 ENCOUNTER — Other Ambulatory Visit (HOSPITAL_COMMUNITY): Payer: Self-pay

## 2022-12-06 ENCOUNTER — Other Ambulatory Visit: Payer: Self-pay

## 2022-12-11 ENCOUNTER — Other Ambulatory Visit (HOSPITAL_COMMUNITY): Payer: Self-pay

## 2022-12-15 ENCOUNTER — Other Ambulatory Visit (HOSPITAL_COMMUNITY): Payer: Self-pay

## 2022-12-17 ENCOUNTER — Other Ambulatory Visit (HOSPITAL_COMMUNITY): Payer: Self-pay

## 2022-12-20 ENCOUNTER — Other Ambulatory Visit: Payer: Self-pay

## 2022-12-20 ENCOUNTER — Other Ambulatory Visit (HOSPITAL_COMMUNITY): Payer: Self-pay

## 2022-12-21 ENCOUNTER — Other Ambulatory Visit (HOSPITAL_COMMUNITY): Payer: Self-pay

## 2022-12-23 ENCOUNTER — Other Ambulatory Visit (HOSPITAL_COMMUNITY): Payer: Self-pay

## 2022-12-25 ENCOUNTER — Telehealth: Payer: Medicare PPO | Admitting: Family Medicine

## 2022-12-25 DIAGNOSIS — K047 Periapical abscess without sinus: Secondary | ICD-10-CM | POA: Diagnosis not present

## 2022-12-25 MED ORDER — CLINDAMYCIN HCL 300 MG PO CAPS: 300.0000 mg | ORAL_CAPSULE | Freq: Three times a day (TID) | ORAL | 0 refills | Status: AC

## 2022-12-25 NOTE — Patient Instructions (Signed)
Dental Abscess  A dental abscess is an infection around a tooth that may involve pain, swelling, and a collection of pus, as well as other symptoms. Treatment is important to help with symptoms and to prevent the infection from spreading. The general types of dental abscesses are: Pulpal abscess. This abscess may form from the inner part of the tooth (pulp). Periodontal abscess. This abscess may form from the gum. What are the causes? This condition is caused by a bacterial infection in or around the tooth. It may result from: Severe tooth decay (cavities). Trauma to the tooth, such as a broken or chipped tooth. What increases the risk? This condition is more likely to develop in males. It is also more likely to develop in people who: Have cavities. Have severe gum disease. Eat sugary snacks between meals. Use tobacco products. Have diabetes. Have a weakened disease-fighting system (immune system). Do not brush and care for their teeth regularly. What are the signs or symptoms? Mild symptoms of this condition include: Tenderness. Bad breath. Fever. A bitter taste in the mouth. Pain in and around the infected tooth. Moderate symptoms of this condition include: Swollen neck glands. Chills. Pus drainage. Swelling and redness around the infected tooth, in the mouth, or in the face. Severe pain in and around the infected tooth. Severe symptoms of this condition include: Difficulty swallowing. Difficulty opening the mouth. Nausea. Vomiting. How is this diagnosed? This condition is diagnosed based on: Your symptoms and your medical and dental history. An examination of the infected tooth. During the exam, your dental care provider may tap on the infected tooth. You may also need to have X-rays taken of the affected area. How is this treated? This condition is treated by getting rid of the infection. This may be done with: Antibiotic medicines. These may be used in certain  situations. Antibacterial mouth rinse. Incision and drainage. This procedure is done by making an incision in the abscess to drain out the pus. Removing pus is the first priority in treating an abscess. A root canal. This may be performed to save the tooth. Your dental care provider accesses the visible part of your tooth (crown) with a drill and removes any infected pulp. Then the space is filled and sealed off. Tooth extraction. The tooth is pulled out if it cannot be saved by other treatment. You may also receive treatment for pain, such as: Acetaminophen or NSAIDs. Gels that contain a numbing medicine. An injection to block the pain near your nerve. Follow these instructions at home: Medicines Take over-the-counter and prescription medicines only as told by your dental care provider. If you were prescribed an antibiotic, take it as told by your dental care provider. Do not stop taking the antibiotic even if you start to feel better. If you were prescribed a gel that contains a numbing medicine, use it exactly as told in the directions. Do not use these gels for children who are younger than 2 years of age. Use an antibacterial mouth rinse as told by your dental care provider. General instructions  Gargle with a mixture of salt and water 3-4 times a day or as needed. To make salt water, completely dissolve -1 tsp (3-6 g) of salt in 1 cup (237 mL) of warm water. Eat a soft diet while your abscess is healing. Drink enough fluid to keep your urine pale yellow. Do not apply heat to the outside of your mouth. Do not use any products that contain nicotine or tobacco. These   products include cigarettes, chewing tobacco, and vaping devices, such as e-cigarettes. If you need help quitting, ask your dental care provider. Keep all follow-up visits. This is important. How is this prevented?  Excellent dental home care, which includes brushing your teeth every morning and night with fluoride  toothpaste. Floss one time each day. Get regularly scheduled dental cleanings. Consider having a dental sealant applied on teeth that have deep grooves to prevent cavities. Drink fluoridated water regularly. This includes most tap water. Check the label on bottled water to see if it contains fluoride. Reduce or eliminate sugary drinks. Eat healthy meals and snacks. Wear a mouth guard or face shield to protect your teeth while playing sports. Contact a health care provider if: Your pain is worse and is not helped by medicine. You have swelling. You see pus around the tooth. You have a fever or chills. Get help right away if: Your symptoms suddenly get worse. You have a very bad headache. You have problems breathing or swallowing. You have trouble opening your mouth. You have swelling in your neck or around your eye. These symptoms may represent a serious problem that is an emergency. Do not wait to see if the symptoms will go away. Get medical help right away. Call your local emergency services (911 in the [place] Do not drive yourself to the hospital. Summary A dental abscess is a collection of pus in or around a tooth that results from an infection. A dental abscess may result from severe tooth decay, trauma to the tooth, or severe gum disease around a tooth. Symptoms include severe pain, swelling, redness, and drainage of pus in and around the infected tooth. The first priority in treating a dental abscess is to drain out the pus. Treatment may also involve removing damage inside the tooth (root canal) or extracting the tooth. This information is not intended to replace advice given to you by your health care provider. Make sure you discuss any questions you have with your health care provider. Document Revised: 05/22/2020 Document Reviewed: 05/22/2020 Elsevier Patient Education  2024 [employer]  

## 2022-12-25 NOTE — Progress Notes (Signed)
Virtual Visit Consent   [name], you are scheduled for a virtual visit with a [facility] provider today. Just as with appointments in the office, your consent must be obtained to participate. Your consent will be active for this visit and any virtual visit you may have with one of our providers in the next 365 days. If you have a MyChart account, a copy of this consent can be sent to you electronically.  As this is a virtual visit, video technology does not allow for your provider to perform a traditional examination. This may limit your provider's ability to fully assess your condition. If your provider identifies any concerns that need to be evaluated in person or the need to arrange testing (such as labs, EKG, etc.), we will make arrangements to do so. Although advances in technology are sophisticated, we cannot ensure that it will always work on either your end or our end. If the connection with a video visit is poor, the visit may have to be switched to a telephone visit. With either a video or telephone visit, we are not always able to ensure that we have a secure connection.  By engaging in this virtual visit, you consent to the provision of healthcare and authorize for your insurance to be billed (if applicable) for the services provided during this visit. Depending on your insurance coverage, you may receive a charge related to this service.  I need to obtain your verbal consent now. Are you willing to proceed with your visit today? [name] has provided verbal consent on 12/25/2022 for a virtual visit (video or telephone). Georgana Curio, FNP  Date: 12/25/2022 9:29 AM  Virtual Visit via Video Note   I, Georgana Curio, connected with  [name]  ([id number], [birth year]) on 12/25/22 at  9:30 AM EDT by a video-enabled telemedicine application and verified that I am speaking with the correct person using two identifiers.  Location: Patient: Virtual Visit Location Patient:  Home Provider: Virtual Visit Location Provider: Home Office   I discussed the limitations of evaluation and management by telemedicine and the availability of in person appointments. The patient expressed understanding and agreed to proceed.    History of Present Illness: [name] is a 62 y.o. who identifies as a female who was assigned female at birth, and is being seen today for a dental infection on the right lower side. She has a history of thyroid cancer and radiation with severe dental decay. No fever. 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 05/16/2016   Fatty liver disease, 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/16/2016   Slurred speech 09/13/2015   Parotitis 09/13/2015   Gastroenteritis 08/21/2015   Hypothyroid 08/21/2015   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   Pain in the chest    Essential hypertension    Chest pain 04/23/2014   Anginal pain (HCC) 04/23/2014   Type 2 diabetes mellitus (HCC)    Hypertension    Hypercholesteremia  Allergies:  Allergies  Allergen Reactions   Amoxicillin Other (See Comments) and Anaphylaxis    Resulted in ambulance per pt   Lorabid [Loracarbef] Anaphylaxis   Augmentin [Amoxicillin-Pot Clavulanate] Hives, Nausea And Vomiting and Other (See Comments)    Chest pain   Codeine Nausea And Vomiting   Furosemide Swelling    Patient states has reaction based on sulfa allergy   Gadolinium  Derivatives Itching, Swelling and Other (See Comments)    Patient will require 13 hr prep prior to administration of gadolinium     Sulfa Drugs Cross Reactors Other (See Comments)    Severe allergic reaction as a child - was not told symptoms... states she has had sulfa reaction to furosemide   Benicar [Olmesartan Medoxomil] Swelling and Rash   Other Rash    Green peas   Medications:  Current Outpatient Medications:    acetaminophen (TYLENOL) 500 MG tablet, Take 2 tablets (1,000 mg total) by mouth every 6 (six) hours as needed (body aches)., Disp: 30 tablet, Rfl: 0   albuterol (VENTOLIN HFA) 108 (90 Base) MCG/ACT inhaler, Inhale 2 puffs every 4 to 6 hours as needed., Disp: 18 g, Rfl: 2   apixaban (ELIQUIS) 5 MG TABS tablet, Take 1 tablet (5 mg total) by mouth 2 (two) times daily., Disp: 60 tablet, Rfl: 0   atorvastatin (LIPITOR) 80 MG tablet, Take 1 tablet (80 mg total) by mouth at bedtime., Disp: 90 tablet, Rfl: 3   CALCIUM-MAGNESIUM-ZINC PO, Take 2 tablets by mouth 2 (two) times daily., Disp: , Rfl:    clindamycin (CLEOCIN) 300 MG capsule, Take 1 capsule (300 mg total) by mouth 3 (three) times daily for 7 days., Disp: 21 capsule, Rfl: 0   Continuous Glucose Sensor (FREESTYLE LIBRE 3 SENSOR) MISC, Change sensor every two weeks, Disp: 6 each, Rfl: 1   diclofenac Sodium (VOLTAREN) 1 % GEL, Apply to affected area(s) twice daily as directed, Disp: 100 g, Rfl: 0   diphenhydrAMINE (BENADRYL) 25 MG tablet, Take 25 mg by mouth at bedtime., Disp: , Rfl:    fluticasone (FLONASE) 50 MCG/ACT nasal spray, Place 2 sprays into both nostrils daily., Disp: , Rfl:    ibuprofen (ADVIL) 200 MG tablet, Take 800 mg by mouth every 8 (eight) hours as needed for headache or moderate pain., Disp: , Rfl:    levothyroxine (SYNTHROID) 200 MCG tablet, Take 1 tablet by mouth once daily, Disp: 90 tablet, Rfl: 1   levothyroxine (SYNTHROID) 200 MCG tablet, Take 1 tablet (200 mcg total) by mouth daily., Disp: 90 tablet, Rfl:  3   lisinopril (ZESTRIL) 10 MG tablet, Take 1 tablet (10 mg total) by mouth daily., Disp: 90 tablet, Rfl: 1   metFORMIN (GLUCOPHAGE) 500 MG tablet, Take 1 tablet (500 mg total) by mouth 2 (two) times daily with a meal., Disp: 60 tablet, Rfl: 2   metoprolol tartrate (LOPRESSOR) 100 MG tablet, Take 1 tablet (100 mg total) by mouth 2 (two) times daily. (Patient not taking: Reported on 04/22/2022), Disp: 60 tablet, Rfl: 0   metoprolol tartrate (LOPRESSOR) 100 MG tablet, Take 1 tablet (100 mg total) by mouth 2 (two) times daily., Disp: 60 tablet, Rfl: 9   mupirocin ointment (BACTROBAN) 2 %, Apply 1 application topically in the morning and at bedtime. Mole removal site on back, Disp: , Rfl:    ondansetron (ZOFRAN) 8 MG tablet, TAKE 1 TABLET BY MOUTH 3 TIMES DAILY (Patient not taking: Reported on 04/22/2022), Disp: 20 tablet, Rfl: 4   pantoprazole (PROTONIX) 40  MG tablet, Take 1 tablet (40 mg total) by mouth daily., Disp: 90 tablet, Rfl: 0   Probiotic Product (PROBIOTIC PO), Take 1 tablet by mouth daily., Disp: , Rfl:    Semaglutide, 1 MG/DOSE, (OZEMPIC, 1 MG/DOSE,) 4 MG/3ML SOPN, Inject 1 mg into the skin once a week., Disp: 3 mL, Rfl: 2   Semaglutide,0.25 or 0.5MG /DOS, (OZEMPIC, 0.25 OR 0.5 MG/DOSE,) 2 MG/1.5ML SOPN, Inject 0.5 mg subcutaneously once a week., Disp: 1.5 mL, Rfl: 1   Semaglutide,0.25 or 0.5MG /DOS, (OZEMPIC, 0.25 OR 0.5 MG/DOSE,) 2 MG/3ML SOPN, Inject 0.5 mg subcutaneously once a week (Patient not taking: Reported on 04/22/2022), Disp: 3 mL, Rfl: 1   Semaglutide,0.25 or 0.5MG /DOS, (OZEMPIC, 0.25 OR 0.5 MG/DOSE,) 2 MG/3ML SOPN, Inject 0.5 mg into the skin once a week (Patient not taking: Reported on 04/22/2022), Disp: 3 mL, Rfl: 0   Semaglutide,0.25 or 0.5MG /DOS, (OZEMPIC, 0.25 OR 0.5 MG/DOSE,) 2 MG/3ML SOPN, Inject 0.5 mg into the skin once a week., Disp: 3 mL, Rfl: 2   spironolactone (ALDACTONE) 25 MG tablet, Take 1 tablet by mouth 2 times a day, Disp: 60 tablet, Rfl: 2   spironolactone  (ALDACTONE) 25 MG tablet, Take 1 tablet (25 mg total) by mouth 2 (two) times daily., Disp: 60 tablet, Rfl: 2   spironolactone (ALDACTONE) 25 MG tablet, Take 1 tablet (25 mg total) by mouth 2 (two) times daily., Disp: 60 tablet, Rfl: 2   tobramycin-dexamethasone (TOBRADEX) ophthalmic solution, Place 2 drops in the effected eye 4 times a day, Disp: 5 mL, Rfl: 0   Torsemide 40 MG TABS, Take 1 tablet by mouth in the morning and at bedtime for 10 days. (Patient not taking: Reported on 04/22/2022), Disp: 20 tablet, Rfl: 0   zinc gluconate 50 MG tablet, Take 50 mg by mouth daily as needed (flu season)., Disp: , Rfl:    Zoster Vaccine Adjuvanted [facility]) injection, Inject into the muscle., Disp: 0.5 mL, Rfl: 1  Observations/Objective: Patient is well-developed, well-nourished in no acute distress.  Resting comfortably  at home.  Head is normocephalic, atraumatic.  No labored breathing. Speech is clear and coherent with logical content.  Patient is alert and oriented at baseline.    Assessment and Plan: 1. Dental abscess  Warm salt water rinses, warm compresseds ,UC if sx worsen, f/u with dentist next week.   Follow Up Instructions: I discussed the assessment and treatment plan with the patient. The patient was provided an opportunity to ask questions and all were answered. The patient agreed with the plan and demonstrated an understanding of the instructions.  A copy of instructions were sent to the patient via MyChart unless otherwise noted below.     The patient was advised to call back or seek an in-person evaluation if the symptoms worsen or if the condition fails to improve as anticipated.  Time:  I spent 10 minutes with the patient via telehealth technology discussing the above problems/concerns.    Georgana Curio, FNP

## 2022-12-27 ENCOUNTER — Ambulatory Visit (INDEPENDENT_AMBULATORY_CARE_PROVIDER_SITE_OTHER): Payer: Medicare PPO

## 2022-12-27 DIAGNOSIS — G459 Transient cerebral ischemic attack, unspecified: Secondary | ICD-10-CM | POA: Diagnosis not present

## 2022-12-28 ENCOUNTER — Other Ambulatory Visit (HOSPITAL_COMMUNITY): Payer: Self-pay

## 2022-12-28 LAB — CUP PACEART REMOTE DEVICE CHECK
Date Time Interrogation Session: 20240930230844
Implantable Pulse Generator Implant Date: 20220307

## 2022-12-30 ENCOUNTER — Other Ambulatory Visit (HOSPITAL_COMMUNITY): Payer: Self-pay

## 2023-01-01 ENCOUNTER — Other Ambulatory Visit (HOSPITAL_COMMUNITY): Payer: Self-pay

## 2023-01-01 ENCOUNTER — Other Ambulatory Visit (HOSPITAL_COMMUNITY): Payer: Self-pay | Admitting: Physician Assistant

## 2023-01-03 ENCOUNTER — Other Ambulatory Visit: Payer: Self-pay

## 2023-01-03 MED ORDER — APIXABAN 5 MG PO TABS
5.0000 mg | ORAL_TABLET | Freq: Two times a day (BID) | ORAL | 5 refills | Status: DC
  Filled 2023-01-03: qty 60, 30d supply, fill #0
  Filled 2023-02-01: qty 60, 30d supply, fill #1
  Filled 2023-03-02: qty 60, 30d supply, fill #2
  Filled 2023-03-29: qty 60, 30d supply, fill #3
  Filled 2023-05-07: qty 60, 30d supply, fill #4
  Filled 2023-06-08: qty 60, 30d supply, fill #5

## 2023-01-03 NOTE — Telephone Encounter (Signed)
Prescription refill request for Eliquis received. Indication:afib Last office visit:1/24 Scr:1.1  2023 Age: 62 Weight:107.6  kg  Prescription refilled

## 2023-01-10 ENCOUNTER — Other Ambulatory Visit: Payer: Self-pay

## 2023-01-10 NOTE — Progress Notes (Signed)
Carelink Summary Report / Loop Recorder 

## 2023-01-25 ENCOUNTER — Other Ambulatory Visit (HOSPITAL_COMMUNITY): Payer: Self-pay

## 2023-01-25 ENCOUNTER — Other Ambulatory Visit: Payer: Self-pay

## 2023-01-25 MED ORDER — METFORMIN HCL 500 MG PO TABS
500.0000 mg | ORAL_TABLET | Freq: Two times a day (BID) | ORAL | 2 refills | Status: DC
  Filled 2023-01-25: qty 60, 30d supply, fill #0
  Filled 2023-02-27: qty 60, 30d supply, fill #1
  Filled 2023-03-29: qty 60, 30d supply, fill #2

## 2023-01-31 ENCOUNTER — Ambulatory Visit (INDEPENDENT_AMBULATORY_CARE_PROVIDER_SITE_OTHER): Payer: Medicare PPO

## 2023-01-31 DIAGNOSIS — G459 Transient cerebral ischemic attack, unspecified: Secondary | ICD-10-CM | POA: Diagnosis not present

## 2023-01-31 LAB — CUP PACEART REMOTE DEVICE CHECK
Date Time Interrogation Session: 20241102230047
Implantable Pulse Generator Implant Date: 20220307

## 2023-02-01 ENCOUNTER — Other Ambulatory Visit: Payer: Self-pay | Admitting: Adult Health

## 2023-02-01 ENCOUNTER — Other Ambulatory Visit (HOSPITAL_COMMUNITY): Payer: Self-pay

## 2023-02-01 MED ORDER — SEMAGLUTIDE (1 MG/DOSE) 4 MG/3ML ~~LOC~~ SOPN
1.0000 mg | PEN_INJECTOR | SUBCUTANEOUS | 3 refills | Status: DC
  Filled 2023-02-01: qty 3, 28d supply, fill #0
  Filled 2023-03-04: qty 3, 28d supply, fill #1
  Filled 2023-04-03 – 2023-04-07 (×2): qty 3, 28d supply, fill #2
  Filled 2023-05-01: qty 3, 28d supply, fill #3

## 2023-02-02 ENCOUNTER — Other Ambulatory Visit: Payer: Self-pay

## 2023-02-03 ENCOUNTER — Other Ambulatory Visit (HOSPITAL_COMMUNITY): Payer: Self-pay

## 2023-02-03 ENCOUNTER — Other Ambulatory Visit: Payer: Self-pay

## 2023-02-03 MED ORDER — PANTOPRAZOLE SODIUM 40 MG PO TBEC
40.0000 mg | DELAYED_RELEASE_TABLET | Freq: Every day | ORAL | 1 refills | Status: DC
  Filled 2023-02-03: qty 90, 90d supply, fill #0
  Filled 2023-04-23: qty 90, 90d supply, fill #1

## 2023-02-20 ENCOUNTER — Other Ambulatory Visit (HOSPITAL_COMMUNITY): Payer: Self-pay

## 2023-02-21 ENCOUNTER — Other Ambulatory Visit (HOSPITAL_COMMUNITY): Payer: Self-pay

## 2023-02-21 MED ORDER — LISINOPRIL 10 MG PO TABS
10.0000 mg | ORAL_TABLET | Freq: Every day | ORAL | 1 refills | Status: DC
  Filled 2023-02-21: qty 90, 90d supply, fill #0
  Filled 2023-05-26: qty 90, 90d supply, fill #1

## 2023-02-22 NOTE — Progress Notes (Signed)
Carelink Summary Report / Loop Recorder 

## 2023-02-27 ENCOUNTER — Other Ambulatory Visit: Payer: Self-pay | Admitting: Internal Medicine

## 2023-02-28 ENCOUNTER — Other Ambulatory Visit: Payer: Self-pay

## 2023-03-02 ENCOUNTER — Telehealth: Payer: Self-pay | Admitting: Internal Medicine

## 2023-03-02 ENCOUNTER — Other Ambulatory Visit: Payer: Self-pay

## 2023-03-02 ENCOUNTER — Other Ambulatory Visit (HOSPITAL_COMMUNITY): Payer: Self-pay

## 2023-03-02 ENCOUNTER — Other Ambulatory Visit: Payer: Self-pay | Admitting: Internal Medicine

## 2023-03-02 MED ORDER — ATORVASTATIN CALCIUM 80 MG PO TABS
80.0000 mg | ORAL_TABLET | Freq: Every day | ORAL | 0 refills | Status: DC
  Filled 2023-03-02: qty 90, 90d supply, fill #0

## 2023-03-02 NOTE — Telephone Encounter (Signed)
 RX sent to requested Pharmacy

## 2023-03-02 NOTE — Telephone Encounter (Signed)
*  STAT* If patient is at the pharmacy, call can be transferred to refill team.   1. Which medications need to be refilled? (please list name of each medication and dose if known)   atorvastatin (LIPITOR) 80 MG tablet     4. Which pharmacy/location (including street and city if local pharmacy) is medication to be sent to? [facility]     5. Do they need a 30 day or 90 day supply? 90

## 2023-03-04 ENCOUNTER — Other Ambulatory Visit: Payer: Self-pay

## 2023-03-07 ENCOUNTER — Ambulatory Visit (INDEPENDENT_AMBULATORY_CARE_PROVIDER_SITE_OTHER): Payer: Medicare PPO

## 2023-03-07 DIAGNOSIS — G459 Transient cerebral ischemic attack, unspecified: Secondary | ICD-10-CM

## 2023-03-07 LAB — CUP PACEART REMOTE DEVICE CHECK
Date Time Interrogation Session: 20241208230648
Implantable Pulse Generator Implant Date: 20220307

## 2023-03-08 ENCOUNTER — Other Ambulatory Visit (HOSPITAL_COMMUNITY): Payer: Self-pay

## 2023-03-08 ENCOUNTER — Other Ambulatory Visit: Payer: Self-pay

## 2023-03-08 MED ORDER — FREESTYLE LIBRE 3 SENSOR MISC
1 refills | Status: DC
  Filled 2023-03-08: qty 6, 84d supply, fill #0
  Filled 2023-05-01 – 2023-05-10 (×3): qty 6, 84d supply, fill #1

## 2023-03-18 ENCOUNTER — Other Ambulatory Visit: Payer: Self-pay

## 2023-03-29 ENCOUNTER — Other Ambulatory Visit: Payer: Self-pay

## 2023-04-03 ENCOUNTER — Other Ambulatory Visit (HOSPITAL_COMMUNITY): Payer: Self-pay

## 2023-04-04 ENCOUNTER — Other Ambulatory Visit: Payer: Self-pay

## 2023-04-05 ENCOUNTER — Other Ambulatory Visit (HOSPITAL_COMMUNITY): Payer: Self-pay

## 2023-04-06 ENCOUNTER — Other Ambulatory Visit: Payer: Self-pay

## 2023-04-07 ENCOUNTER — Other Ambulatory Visit: Payer: Self-pay

## 2023-04-07 ENCOUNTER — Other Ambulatory Visit (HOSPITAL_COMMUNITY): Payer: Self-pay

## 2023-04-11 ENCOUNTER — Ambulatory Visit (INDEPENDENT_AMBULATORY_CARE_PROVIDER_SITE_OTHER): Payer: Medicare PPO

## 2023-04-11 DIAGNOSIS — G459 Transient cerebral ischemic attack, unspecified: Secondary | ICD-10-CM | POA: Diagnosis not present

## 2023-04-11 LAB — CUP PACEART REMOTE DEVICE CHECK
Date Time Interrogation Session: 20250112230733
Implantable Pulse Generator Implant Date: 20220307

## 2023-04-23 ENCOUNTER — Other Ambulatory Visit (HOSPITAL_COMMUNITY): Payer: Self-pay

## 2023-04-25 ENCOUNTER — Other Ambulatory Visit: Payer: Self-pay

## 2023-04-25 ENCOUNTER — Other Ambulatory Visit (HOSPITAL_COMMUNITY): Payer: Self-pay

## 2023-04-25 MED ORDER — METFORMIN HCL 500 MG PO TABS
500.0000 mg | ORAL_TABLET | Freq: Two times a day (BID) | ORAL | 0 refills | Status: DC
  Filled 2023-04-25: qty 60, 30d supply, fill #0

## 2023-04-27 ENCOUNTER — Other Ambulatory Visit (HOSPITAL_COMMUNITY): Payer: Self-pay

## 2023-04-27 MED ORDER — OZEMPIC (1 MG/DOSE) 4 MG/3ML ~~LOC~~ SOPN
1.0000 mg | PEN_INJECTOR | SUBCUTANEOUS | 2 refills | Status: DC
  Filled 2023-04-27: qty 9, 90d supply, fill #0
  Filled 2023-05-02: qty 9, 84d supply, fill #0
  Filled 2023-05-26: qty 3, 28d supply, fill #0
  Filled 2023-06-27: qty 3, 28d supply, fill #1
  Filled 2023-07-25: qty 3, 28d supply, fill #2
  Filled 2023-08-22: qty 3, 28d supply, fill #3
  Filled 2023-09-19: qty 3, 28d supply, fill #4
  Filled 2023-10-14: qty 3, 28d supply, fill #5
  Filled 2023-11-12: qty 3, 28d supply, fill #6
  Filled 2023-12-15: qty 3, 28d supply, fill #7
  Filled 2024-01-16: qty 3, 28d supply, fill #8

## 2023-05-02 ENCOUNTER — Other Ambulatory Visit (HOSPITAL_COMMUNITY): Payer: Self-pay

## 2023-05-02 ENCOUNTER — Other Ambulatory Visit: Payer: Self-pay

## 2023-05-07 ENCOUNTER — Other Ambulatory Visit (HOSPITAL_COMMUNITY): Payer: Self-pay

## 2023-05-09 ENCOUNTER — Other Ambulatory Visit (HOSPITAL_COMMUNITY): Payer: Self-pay

## 2023-05-10 ENCOUNTER — Other Ambulatory Visit: Payer: Self-pay

## 2023-05-10 ENCOUNTER — Other Ambulatory Visit (HOSPITAL_COMMUNITY): Payer: Self-pay

## 2023-05-11 ENCOUNTER — Other Ambulatory Visit (HOSPITAL_COMMUNITY): Payer: Self-pay

## 2023-05-16 ENCOUNTER — Ambulatory Visit (INDEPENDENT_AMBULATORY_CARE_PROVIDER_SITE_OTHER): Payer: Medicare PPO

## 2023-05-16 DIAGNOSIS — G459 Transient cerebral ischemic attack, unspecified: Secondary | ICD-10-CM

## 2023-05-17 LAB — CUP PACEART REMOTE DEVICE CHECK
Date Time Interrogation Session: 20250216230648
Implantable Pulse Generator Implant Date: 20220307

## 2023-05-18 ENCOUNTER — Encounter: Payer: Self-pay | Admitting: Cardiology

## 2023-05-23 NOTE — Progress Notes (Signed)
 Carelink Summary Report / Loop Recorder

## 2023-05-26 ENCOUNTER — Other Ambulatory Visit: Payer: Self-pay

## 2023-05-26 ENCOUNTER — Other Ambulatory Visit (HOSPITAL_COMMUNITY): Payer: Self-pay

## 2023-05-26 ENCOUNTER — Other Ambulatory Visit (HOSPITAL_BASED_OUTPATIENT_CLINIC_OR_DEPARTMENT_OTHER): Payer: Self-pay

## 2023-05-26 ENCOUNTER — Other Ambulatory Visit: Payer: Self-pay | Admitting: Internal Medicine

## 2023-05-26 MED ORDER — METFORMIN HCL 500 MG PO TABS
500.0000 mg | ORAL_TABLET | Freq: Two times a day (BID) | ORAL | 4 refills | Status: DC
  Filled 2023-05-26: qty 60, 30d supply, fill #0
  Filled 2023-06-27: qty 60, 30d supply, fill #1
  Filled 2023-07-25: qty 60, 30d supply, fill #2
  Filled 2023-08-22: qty 60, 30d supply, fill #3
  Filled 2023-09-21: qty 60, 30d supply, fill #4

## 2023-05-26 MED ORDER — ATORVASTATIN CALCIUM 80 MG PO TABS
80.0000 mg | ORAL_TABLET | Freq: Every day | ORAL | 0 refills | Status: DC
  Filled 2023-05-26: qty 15, 15d supply, fill #0

## 2023-05-30 ENCOUNTER — Other Ambulatory Visit (HOSPITAL_COMMUNITY): Payer: Self-pay

## 2023-06-01 ENCOUNTER — Other Ambulatory Visit (HOSPITAL_COMMUNITY): Payer: Self-pay

## 2023-06-08 ENCOUNTER — Other Ambulatory Visit (HOSPITAL_COMMUNITY): Payer: Self-pay

## 2023-06-13 ENCOUNTER — Other Ambulatory Visit (HOSPITAL_COMMUNITY): Payer: Self-pay

## 2023-06-13 ENCOUNTER — Telehealth: Payer: Self-pay | Admitting: Internal Medicine

## 2023-06-13 ENCOUNTER — Other Ambulatory Visit: Payer: Self-pay | Admitting: Internal Medicine

## 2023-06-13 ENCOUNTER — Other Ambulatory Visit: Payer: Self-pay

## 2023-06-13 MED ORDER — ATORVASTATIN CALCIUM 80 MG PO TABS
80.0000 mg | ORAL_TABLET | Freq: Every day | ORAL | 0 refills | Status: DC
  Filled 2023-06-13: qty 30, 30d supply, fill #0

## 2023-06-13 NOTE — Telephone Encounter (Signed)
 Pt's medication was sent to pt's pharmacy as requested. Confirmation received.

## 2023-06-13 NOTE — Telephone Encounter (Signed)
*  STAT* If patient is at the pharmacy, call can be transferred to refill team.   1. Which medications need to be refilled? (please list name of each medication and dose if known)   atorvastatin (LIPITOR) 80 MG tablet   2. Would you like to learn more about the convenience, safety, & potential cost savings by using the [facility]?   3. Are you open to using the [facility] (Type [facility]. ).  4. Which pharmacy/location (including street and city if local pharmacy) is medication to be sent to?   - [facility]   5. Do they need a 30 day or 90 day supply?   Patient stated she only has 4 tablets left and has an appointment scheduled on 3/26.

## 2023-06-15 ENCOUNTER — Other Ambulatory Visit (HOSPITAL_COMMUNITY): Payer: Self-pay

## 2023-06-20 ENCOUNTER — Ambulatory Visit (INDEPENDENT_AMBULATORY_CARE_PROVIDER_SITE_OTHER): Payer: Medicare PPO

## 2023-06-20 DIAGNOSIS — G459 Transient cerebral ischemic attack, unspecified: Secondary | ICD-10-CM | POA: Diagnosis not present

## 2023-06-20 LAB — CUP PACEART REMOTE DEVICE CHECK
Date Time Interrogation Session: 20250323230736
Implantable Pulse Generator Implant Date: 20220307

## 2023-06-22 ENCOUNTER — Ambulatory Visit: Attending: Emergency Medicine | Admitting: Emergency Medicine

## 2023-06-22 ENCOUNTER — Encounter: Payer: Self-pay | Admitting: Emergency Medicine

## 2023-06-22 VITALS — BP 118/82 | HR 86 | Ht 66.0 in | Wt 227.0 lb

## 2023-06-22 DIAGNOSIS — E785 Hyperlipidemia, unspecified: Secondary | ICD-10-CM

## 2023-06-22 DIAGNOSIS — I5032 Chronic diastolic (congestive) heart failure: Secondary | ICD-10-CM | POA: Diagnosis not present

## 2023-06-22 DIAGNOSIS — I4819 Other persistent atrial fibrillation: Secondary | ICD-10-CM

## 2023-06-22 DIAGNOSIS — I10 Essential (primary) hypertension: Secondary | ICD-10-CM

## 2023-06-22 DIAGNOSIS — E119 Type 2 diabetes mellitus without complications: Secondary | ICD-10-CM

## 2023-06-22 LAB — LDL CHOLESTEROL, DIRECT

## 2023-06-22 NOTE — Progress Notes (Signed)
 Carelink Summary Report / Loop Recorder

## 2023-06-22 NOTE — Patient Instructions (Signed)
 Medication Instructions:  NO CHANGES    Lab Work: CBC, CMET, AND DIRECT LDL TO BE DONE TODAY.    Testing/Procedures: NONE   Follow-Up: At [facility], you and your health needs are our priority.  As part of our continuing mission to provide you with exceptional heart care, we have created designated Provider Care Teams.  These Care Teams include your primary Cardiologist (physician) and Advanced Practice Providers (APPs -  Physician Assistants and Nurse Practitioners) who all work together to provide you with the care you need, when you need it.     Your next appointment:   6 MONTHS  Provider:   Rise Paganini, DNP NEEDS APPOINTMENT WITH AFIB. IN 1 MONTH. Other Instructions:

## 2023-06-22 NOTE — Progress Notes (Signed)
 Cardiology Office Note:    Date:  06/22/2023  ID:  [name], DOB [birth year], MRN [id number] PCP: Leilani Able, MD  [facility] Providers Cardiologist:  Maisie Fus, MD       Patient Profile:      Chief Complaint: 1 year follow-up. History of Present Illness:  [name] is a 63 y.o. female with visit-pertinent history of T2DM, hyperlipidemia, hypertension, TIA, renal cancer s/p right renal cryoablation (normal TKI), TIA, thyroid cancer s/p total thyroidectomy in 2016 on Synthroid, atrial fibrillation, diastolic dysfunction   Echocardiogram 02/2020 showed LVEF 60 to 65%, no RWMA, mild LVH, grade 2 diastolic dysfunction, RV function normal, no valvular abnormalities.  She was initially evaluated by EP on 06/02/2020 for history of TIA.  ILR was placed at that time.    Echocardiogram 09/2020 showed LVEF 60 to 65%, no RWMA, mild LVH, RV function normal, no valvular abnormalities.  She was initially diagnosed with atrial fibrillation on 06/07/2021 on her ILR.  She established with atrial fibrillation clinic on 06/10/2021, she was started on Eliquis 5 mg twice daily at that time.  She established with Dr. Wyline Mood on 07/2021.  She was doing well at the time and medication management was continued.  She was last seen in clinic on 04/22/2022 by Dr. Wyline Mood.  She was doing well at the time without acute cardiovascular concerns or complaints.  Her atorvastatin was increased to 80 mg daily.  She was to follow-up in 6 months.   Discussed the use of AI scribe software for clinical note transcription with the patient, who gave verbal consent to proceed.  Today patient comes into clinic by herself.  She has not been seen in clinic in 1 year and 2 months.  She notes she is doing well overall.  However she notes she has been going into atrial fibrillation more frequently.  She tells me that she is cardiac aware of when she is in atrial fibrillation.  She notes that time she does become  symptomatic with lightheadedness and dizziness that can last from a few minutes to several hours.  She also notes sometimes when she is in atrial fibrillation she will get short of breath however this is typically short-lived.  She notes her weight has overall been stable.  She will have intermittent swelling to her ankles however denies any leg swelling.  She denies any dyspnea on exertion, orthopnea, PND.  She has noted to have lost around 10 pounds over the past year. She attributes this weight loss to dietary changes, including drinking a glass of ice water every morning on an empty stomach and eating light meals throughout the day.   The patient is currently on disability and maintains a relatively active lifestyle by taking care of her grandchild during the week and picking up her other grandchildren from school in the afternoon.     Review of systems:  Please see the history of present illness. All other systems are reviewed and otherwise negative.     Home Medications:    Current Meds  Medication Sig   acetaminophen (TYLENOL) 500 MG tablet Take 2 tablets (1,000 mg total) by mouth every 6 (six) hours as needed (body aches).   albuterol (VENTOLIN HFA) 108 (90 Base) MCG/ACT inhaler Inhale 2 puffs every 4 to 6 hours as needed.   apixaban (ELIQUIS) 5 MG TABS tablet Take 1 tablet (5 mg total) by mouth 2 (two) times daily.   atorvastatin (LIPITOR) 80 MG tablet Take 1  tablet (80 mg total) by mouth at bedtime.   CALCIUM-MAGNESIUM-ZINC PO Take 2 tablets by mouth 2 (two) times daily.   Continuous Glucose Sensor (FREESTYLE LIBRE 3 SENSOR) MISC Change sensor every two weeks   diclofenac Sodium (VOLTAREN) 1 % GEL Apply to affected area(s) twice daily as directed   diphenhydrAMINE (BENADRYL) 25 MG tablet Take 25 mg by mouth at bedtime.   fluticasone (FLONASE) 50 MCG/ACT nasal spray Place 2 sprays into both nostrils daily.   ibuprofen (ADVIL) 200 MG tablet Take 800 mg by mouth every 8 (eight) hours as  needed for headache or moderate pain.   levothyroxine (SYNTHROID) 200 MCG tablet Take 1 tablet by mouth once daily   levothyroxine (SYNTHROID) 200 MCG tablet Take 1 tablet (200 mcg total) by mouth daily.   lisinopril (ZESTRIL) 10 MG tablet Take 1 tablet (10 mg total) by mouth daily.   metFORMIN (GLUCOPHAGE) 500 MG tablet Take 1 tablet (500 mg total) by mouth 2 (two) times daily with a meal.   metoprolol tartrate (LOPRESSOR) 100 MG tablet Take 1 tablet (100 mg total) by mouth 2 (two) times daily.   metoprolol tartrate (LOPRESSOR) 100 MG tablet Take 1 tablet (100 mg total) by mouth 2 (two) times daily.   mupirocin ointment (BACTROBAN) 2 % Apply 1 application topically in the morning and at bedtime. Mole removal site on back   ondansetron (ZOFRAN) 8 MG tablet TAKE 1 TABLET BY MOUTH 3 TIMES DAILY   pantoprazole (PROTONIX) 40 MG tablet Take 1 tablet (40 mg total) by mouth daily.   Probiotic Product (PROBIOTIC PO) Take 1 tablet by mouth daily.   Semaglutide, 1 MG/DOSE, (OZEMPIC, 1 MG/DOSE,) 4 MG/3ML SOPN Inject 1 mg into the skin once a week.   Semaglutide, 1 MG/DOSE, (OZEMPIC, 1 MG/DOSE,) 4 MG/3ML SOPN Inject 1 mg into the skin once a week.   Semaglutide,0.25 or 0.5MG /DOS, (OZEMPIC, 0.25 OR 0.5 MG/DOSE,) 2 MG/1.5ML SOPN Inject 0.5 mg subcutaneously once a week.   Semaglutide,0.25 or 0.5MG /DOS, (OZEMPIC, 0.25 OR 0.5 MG/DOSE,) 2 MG/3ML SOPN Inject 0.5 mg subcutaneously once a week   Semaglutide,0.25 or 0.5MG /DOS, (OZEMPIC, 0.25 OR 0.5 MG/DOSE,) 2 MG/3ML SOPN Inject 0.5 mg into the skin once a week   Semaglutide,0.25 or 0.5MG /DOS, (OZEMPIC, 0.25 OR 0.5 MG/DOSE,) 2 MG/3ML SOPN Inject 0.5 mg into the skin once a week.   spironolactone (ALDACTONE) 25 MG tablet Take 1 tablet by mouth 2 times a day   spironolactone (ALDACTONE) 25 MG tablet Take 1 tablet (25 mg total) by mouth 2 (two) times daily.   spironolactone (ALDACTONE) 25 MG tablet Take 1 tablet (25 mg total) by mouth 2 (two) times daily.    tobramycin-dexamethasone (TOBRADEX) ophthalmic solution Place 2 drops in the effected eye 4 times a day   zinc gluconate 50 MG tablet Take 50 mg by mouth daily as needed (flu season).   Zoster Vaccine Adjuvanted [facility]) injection Inject into the muscle.   Studies Reviewed:   EKG Interpretation Date/Time:  Wednesday June 22 2023 11:31:32 EDT Ventricular Rate:  86 PR Interval:    QRS Duration:  96 QT Interval:  342 QTC Calculation: 409 R Axis:   -62  Text Interpretation: Atrial fibrillation Left axis deviation Confirmed by Rise Paganini [phone]) on 06/22/2023 4:46:50 PM   Echocardiogram 10/16/2020 1. Left ventricular ejection fraction, by estimation, is 60 to 65%. The  left ventricle has normal function. The left ventricle has no regional  wall motion abnormalities. There is mild concentric left ventricular  hypertrophy.  Left ventricular diastolic  parameters are indeterminate but suggestive of at elevated left atrial  pressure.   2. Right ventricular systolic function is normal. The right ventricular  size is normal. Tricuspid regurgitation signal is inadequate for assessing  PA pressure.   3. The mitral valve is normal in structure. No evidence of mitral valve  regurgitation.   4. The aortic valve is tricuspid. There is mild thickening of the aortic  valve. Aortic valve regurgitation is not visualized.   5. The inferior vena cava is normal in size with greater than 50%  respiratory variability, suggesting right atrial pressure of 3 mmHg.   Risk Assessment/Calculations:    CHA2DS2-VASc Score = 6   This indicates a 9.7% annual risk of stroke. The patient's score is based upon: CHF History: 1 HTN History: 1 Diabetes History: 1 Stroke History: 2 Vascular Disease History: 0 Age Score: 0 Gender Score: 1            Physical Exam:   VS:  BP 118/82 (BP Location: Left Arm, Patient Position: Sitting, Cuff Size: Large)   Pulse 86   Ht 5\' 6"  (1.676 m)   Wt 227 lb (103 kg)    BMI 36.64 kg/m    Wt Readings from Last 3 Encounters:  06/22/23 227 lb (103 kg)  04/22/22 237 lb 3.2 oz (107.6 kg)  12/17/21 238 lb (108 kg)    GEN: Well nourished, well developed in no acute distress NECK: No JVD; No carotid bruits CARDIAC: Irregularly irregular rhythm, no murmurs, rubs, gallops RESPIRATORY:  Clear to auscultation without rales, wheezing or rhonchi  ABDOMEN: Soft, non-tender, non-distended EXTREMITIES:  No edema; No acute deformity     Assessment and Plan:  Persistent atrial fibrillation EKG today shows rate controlled atrial fibrillation.   - Most recent ILR check showed 920 A-fib episodes with 23.7% burden.  - Previous Afib burdens per ILR (24% and 22%)  - She does note she is cardiac aware of her arrhythmia and does become symptomatic at times with lightheadedness/dizziness - Atrial fibrillation has primarily been managed by A-fib clinic in the past however she has not been seen in quite some time.  Will set up appointment back with atrial fibrillation clinic to discuss antiarrhythmic therapy.  -Continue Metoprolol Tartrate 100 mg 2 times daily - Continue Eliquis 5 mg twice daily - CBC, BMET today  CHA2DS2-VASc Score = 6 [CHF History: 1, HTN History: 1, Diabetes History: 1, Stroke History: 2, Vascular Disease History: 0, Age Score: 0, Gender Score: 1].  Therefore, the patient's annual risk of stroke is 9.7 %.      Diastolic CHF Most recent echocardiogram 09/2020 with LVEF 60 to 65% - Today she is euvolemic and well compensated on exam.  NYHA class I. - She has no symptoms of overt heart failure.  Denies DOE, orthopnea, PND, leg swelling.  Currently not requiring loop diuretic. - Continue Spironolactone 25 mg daily   Hyperlipidemia, LDL goal < 70 LDL 54 on 03/2022, and under excellent control - Continue Atorvastatin 80 mg daily - Direct LDL today - Recommend DASH diet (high in vegetables, fruits, low-fat dairy products, whole grains, poultry, fish, and nuts  and low in sweets, sugar-sweetened beverages, and red meats), salt restriction and increase physical activity  Hypertension Blood pressure today is 118/82 and under excellent control and under her goal of < 130/80 -Continue lisinopril 10 mg daily, metoprolol tartrate 100 mg twice daily  T2DM Managed on Ozempic and Metformin - Per PCP  Dispo: Follow-up with atrial fibrillation clinic in 1 month.  Follow-up with Dr. Wyline Mood or myself in 6 months.  Signed, Denyce Robert, NP

## 2023-06-22 NOTE — Addendum Note (Signed)
 Addended by: Geralyn Flash D on: 06/22/2023 03:14 PM   Modules accepted: Orders

## 2023-06-23 LAB — COMPREHENSIVE METABOLIC PANEL WITH GFR
ALT: 16 IU/L (ref 0–32)
AST: 11 IU/L (ref 0–40)
Albumin: 4.4 g/dL (ref 3.9–4.9)
Alkaline Phosphatase: 122 IU/L — ABNORMAL HIGH (ref 44–121)
BUN/Creatinine Ratio: 14 (ref 12–28)
BUN: 17 mg/dL (ref 8–27)
Bilirubin Total: 0.5 mg/dL (ref 0.0–1.2)
CO2: 20 mmol/L (ref 20–29)
Calcium: 9 mg/dL (ref 8.7–10.3)
Chloride: 100 mmol/L (ref 96–106)
Creatinine, Ser: 1.18 mg/dL — ABNORMAL HIGH (ref 0.57–1.00)
Globulin, Total: 2.9 g/dL (ref 1.5–4.5)
Glucose: 174 mg/dL — ABNORMAL HIGH (ref 70–99)
Potassium: 5.6 mmol/L — ABNORMAL HIGH (ref 3.5–5.2)
Sodium: 135 mmol/L (ref 134–144)
Total Protein: 7.3 g/dL (ref 6.0–8.5)
eGFR: 53 mL/min/{1.73_m2} — ABNORMAL LOW (ref >59)

## 2023-06-23 LAB — CBC
Hematocrit: 38.5 % (ref 34.0–46.6)
Hemoglobin: 12.4 g/dL (ref 11.1–15.9)
MCH: 28.2 pg (ref 26.6–33.0)
MCHC: 32.2 g/dL (ref 31.5–35.7)
MCV: 88 fL (ref 79–97)
Platelets: 424 10*3/uL (ref 150–450)
RBC: 4.4 x10E6/uL (ref 3.77–5.28)
RDW: 12.7 % (ref 11.7–15.4)
WBC: 10.4 10*3/uL (ref 3.4–10.8)

## 2023-06-23 LAB — LDL CHOLESTEROL, DIRECT: LDL Direct: 39 mg/dL (ref 0–99)

## 2023-06-24 ENCOUNTER — Other Ambulatory Visit: Payer: Self-pay | Admitting: *Deleted

## 2023-06-24 DIAGNOSIS — I10 Essential (primary) hypertension: Secondary | ICD-10-CM

## 2023-06-24 DIAGNOSIS — E7800 Pure hypercholesterolemia, unspecified: Secondary | ICD-10-CM

## 2023-06-25 ENCOUNTER — Encounter: Payer: Self-pay | Admitting: Cardiology

## 2023-06-27 ENCOUNTER — Other Ambulatory Visit (HOSPITAL_COMMUNITY): Payer: Self-pay

## 2023-06-28 ENCOUNTER — Telehealth: Payer: Self-pay | Admitting: Emergency Medicine

## 2023-06-28 NOTE — Telephone Encounter (Signed)
 Follow Up:     Patient said she was supposed to call Crystal back.

## 2023-06-28 NOTE — Telephone Encounter (Signed)
 Called pt to explain her concerns have been relayed to the provider but have not bee addressed yet since he is out of the office. She verbalized understanding. Pt states she has not stopped taking the Spirolactone, "I know what condition I was in last time before they started me on the medication and I don't want to get fluid build up and fluid around my heart." Pt states she did have the additional labs drawn today, I explained to the pt she needed the labs drawn after she stopped taking the medication. She verbalized understanding but is very concerned that she will have fluid build up.  Will get message to the provider for review.

## 2023-06-29 ENCOUNTER — Other Ambulatory Visit (HOSPITAL_COMMUNITY): Payer: Self-pay

## 2023-06-29 LAB — BASIC METABOLIC PANEL WITH GFR
BUN/Creatinine Ratio: 20 (ref 12–28)
BUN: 29 mg/dL — ABNORMAL HIGH (ref 8–27)
CO2: 17 mmol/L — ABNORMAL LOW (ref 20–29)
Calcium: 8.7 mg/dL (ref 8.7–10.3)
Chloride: 101 mmol/L (ref 96–106)
Creatinine, Ser: 1.42 mg/dL — ABNORMAL HIGH (ref 0.57–1.00)
Glucose: 166 mg/dL — ABNORMAL HIGH (ref 70–99)
Potassium: 5.1 mmol/L (ref 3.5–5.2)
Sodium: 136 mmol/L (ref 134–144)
eGFR: 42 mL/min/{1.73_m2} — ABNORMAL LOW (ref >59)

## 2023-07-05 ENCOUNTER — Other Ambulatory Visit (HOSPITAL_COMMUNITY): Payer: Self-pay | Admitting: Physician Assistant

## 2023-07-06 ENCOUNTER — Other Ambulatory Visit: Payer: Self-pay

## 2023-07-06 ENCOUNTER — Other Ambulatory Visit (HOSPITAL_COMMUNITY): Payer: Self-pay

## 2023-07-06 MED ORDER — APIXABAN 5 MG PO TABS
5.0000 mg | ORAL_TABLET | Freq: Two times a day (BID) | ORAL | 5 refills | Status: DC
  Filled 2023-07-06: qty 60, 30d supply, fill #0
  Filled 2023-07-30: qty 60, 30d supply, fill #1
  Filled 2023-08-24: qty 60, 30d supply, fill #2
  Filled 2023-09-30: qty 60, 30d supply, fill #3
  Filled 2023-10-29: qty 60, 30d supply, fill #4
  Filled 2023-11-28: qty 60, 30d supply, fill #5

## 2023-07-06 MED ORDER — ATORVASTATIN CALCIUM 80 MG PO TABS
80.0000 mg | ORAL_TABLET | Freq: Every day | ORAL | 3 refills | Status: AC
  Filled 2023-07-06 – 2023-07-12 (×2): qty 90, 90d supply, fill #0
  Filled 2023-10-03: qty 90, 90d supply, fill #1
  Filled 2024-01-06: qty 90, 90d supply, fill #2
  Filled 2024-04-10 – 2024-04-11 (×2): qty 90, 90d supply, fill #3

## 2023-07-08 ENCOUNTER — Other Ambulatory Visit (HOSPITAL_BASED_OUTPATIENT_CLINIC_OR_DEPARTMENT_OTHER): Payer: Self-pay

## 2023-07-13 ENCOUNTER — Other Ambulatory Visit (HOSPITAL_COMMUNITY): Payer: Self-pay

## 2023-07-13 ENCOUNTER — Other Ambulatory Visit: Payer: Self-pay

## 2023-07-15 ENCOUNTER — Other Ambulatory Visit (HOSPITAL_COMMUNITY): Payer: Self-pay

## 2023-07-21 ENCOUNTER — Telehealth: Payer: Self-pay | Admitting: Adult Health

## 2023-07-21 ENCOUNTER — Other Ambulatory Visit (HOSPITAL_COMMUNITY): Payer: Self-pay

## 2023-07-21 NOTE — Telephone Encounter (Signed)
 Patient request refill for pantoprazole  (PROTONIX ) 40 MG tablet send to [facility].  Scheduled appointment on 11/05/05/23 at 10:45 am

## 2023-07-22 ENCOUNTER — Other Ambulatory Visit (HOSPITAL_COMMUNITY): Payer: Self-pay

## 2023-07-22 MED ORDER — LEVOTHYROXINE SODIUM 150 MCG PO TABS
150.0000 ug | ORAL_TABLET | Freq: Every day | ORAL | 3 refills | Status: AC
  Filled 2023-07-22: qty 30, 30d supply, fill #0
  Filled 2023-08-22: qty 90, 90d supply, fill #1
  Filled 2023-11-16: qty 90, 90d supply, fill #2
  Filled 2024-02-16: qty 90, 90d supply, fill #3

## 2023-07-25 ENCOUNTER — Encounter: Payer: Self-pay | Admitting: Cardiology

## 2023-07-25 ENCOUNTER — Other Ambulatory Visit (HOSPITAL_COMMUNITY): Payer: Self-pay

## 2023-07-25 ENCOUNTER — Other Ambulatory Visit (HOSPITAL_COMMUNITY): Payer: Self-pay | Admitting: Internal Medicine

## 2023-07-25 ENCOUNTER — Ambulatory Visit (INDEPENDENT_AMBULATORY_CARE_PROVIDER_SITE_OTHER): Payer: Medicare PPO

## 2023-07-25 DIAGNOSIS — G459 Transient cerebral ischemic attack, unspecified: Secondary | ICD-10-CM

## 2023-07-25 LAB — CUP PACEART REMOTE DEVICE CHECK
Date Time Interrogation Session: 20250427230632
Implantable Pulse Generator Implant Date: 20220307

## 2023-07-26 ENCOUNTER — Other Ambulatory Visit: Payer: Self-pay

## 2023-07-26 ENCOUNTER — Ambulatory Visit (HOSPITAL_COMMUNITY)
Admission: RE | Admit: 2023-07-26 | Discharge: 2023-07-26 | Disposition: A | Discharge status: Home / Self Care | Source: Ambulatory Visit | Attending: Physician Assistant | Admitting: Physician Assistant

## 2023-07-26 ENCOUNTER — Other Ambulatory Visit (HOSPITAL_COMMUNITY): Payer: Self-pay

## 2023-07-26 ENCOUNTER — Encounter (HOSPITAL_COMMUNITY): Payer: Self-pay

## 2023-07-26 ENCOUNTER — Other Ambulatory Visit (HOSPITAL_COMMUNITY): Payer: Self-pay | Admitting: *Deleted

## 2023-07-26 ENCOUNTER — Encounter (HOSPITAL_COMMUNITY): Payer: Self-pay | Admitting: Physician Assistant

## 2023-07-26 VITALS — BP 110/70 | HR 92 | Ht 66.0 in | Wt 221.6 lb

## 2023-07-26 DIAGNOSIS — I480 Paroxysmal atrial fibrillation: Secondary | ICD-10-CM | POA: Diagnosis not present

## 2023-07-26 DIAGNOSIS — D6869 Other thrombophilia: Secondary | ICD-10-CM | POA: Diagnosis not present

## 2023-07-26 DIAGNOSIS — R0683 Snoring: Secondary | ICD-10-CM

## 2023-07-26 NOTE — Progress Notes (Signed)
 Primary Care Physician: Danella Dunn, MD Primary Cardiologist: none Primary Electrophysiologist: Dr Marven Slimmer Referring Physician: Dr Sharren Decree is a 63 y.o. female with a history of DM, HLD, HTN, renal cancer s/p right renal cryoablation, TIA, atrial fibrillation who presents for follow up in the [facility].  The patient was initially diagnosed with atrial fibrillation 06/07/21 on her ILR which was placed 05/2020 for TIA. Patient does report that she felt brief palpitations at the time of her afib. No other associated symptoms. She denies alcohol use but she does admit to snoring and daytime somnolence.   Patient returns for follow up for atrial fibrillation. She is in rate controlled afib today. She does have intermittent symptoms of SOB, fatigue, and palpitations. Her ILR shows ~25% afib burden. There are no specific triggers that she can identify. No bleeding issues on anticoagulation.   Today, she  denies symptoms of chest pain, orthopnea, PND, lower extremity edema, dizziness, presyncope, syncope, snoring, daytime somnolence, bleeding, or neurologic sequela. The patient is tolerating medications without difficulties and is otherwise without complaint today.    Atrial Fibrillation Risk Factors:  she does have symptoms or diagnosis of sleep apnea. she does not have a history of rheumatic fever. she does not have a history of alcohol use. The patient does not have a history of early familial atrial fibrillation or other arrhythmias.   Atrial Fibrillation Management history:  Previous antiarrhythmic drugs: none Previous cardioversions: none Previous ablations: none Anticoagulation history: Eliquis    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 "   Asthma    due to allergies    Cancer (HCC)    THRYOID 2016 [place] WITH SURGERY, KIDNEY CANCER MAY 2018  RIGHT RENAL CRYOABLATION   Complication of  anesthesia    Diabetes mellitus    TYPE 2   Family history of adverse reaction to anesthesia    difficult for father to wake after surgery, AFTHER LOW BP WITH ANESTHESIA   HOH (hard of hearing)    Hypercholesteremia    Hypertension    PONV (postoperative nausea and vomiting)    Renal cancer, right (HCC) dx'd 07/2018, 10/2018   ablation x 2    Thyroid  disease    TIA (transient ischemic attack)    LAST TIA  APRIL 2020 ON PLAVIX , reports residual effects ,02/2020    Current Outpatient Medications  Medication Sig Dispense Refill   acetaminophen  (TYLENOL ) 500 MG tablet Take 2 tablets (1,000 mg total) by mouth every 6 (six) hours as needed (body aches). 30 tablet 0   albuterol  (VENTOLIN  HFA) 108 (90 Base) MCG/ACT inhaler Inhale 2 puffs every 4 to 6 hours as needed. 18 g 2   apixaban  (ELIQUIS ) 5 MG TABS tablet Take 1 tablet (5 mg total) by mouth 2 (two) times daily. 60 tablet 5   atorvastatin  (LIPITOR) 80 MG tablet Take 1 tablet (80 mg total) by mouth at bedtime. 90 tablet 3   CALCIUM -MAGNESIUM -ZINC PO Take 2 tablets by mouth 2 (two) times daily.     Continuous Glucose Sensor (FREESTYLE LIBRE 3 SENSOR) MISC Change sensor every two weeks 6 each 1   diclofenac  Sodium (VOLTAREN ) 1 % GEL Apply to affected area(s) twice daily as directed 100 g 0   diphenhydrAMINE  (BENADRYL ) 25 MG tablet Take 25 mg by mouth at bedtime.     fluticasone  (FLONASE ) 50 MCG/ACT  nasal spray Place 2 sprays into both nostrils daily.     ibuprofen  (ADVIL ) 200 MG tablet Take 800 mg by mouth every 8 (eight) hours as needed for headache or moderate pain.     levothyroxine  (SYNTHROID ) 150 MCG tablet Take 1 tablet (150 mcg total) by mouth daily. 90 tablet 3   lisinopril  (ZESTRIL ) 10 MG tablet Take 1 tablet (10 mg total) by mouth daily. 90 tablet 1   metFORMIN  (GLUCOPHAGE ) 500 MG tablet Take 1 tablet (500 mg total) by mouth 2 (two) times daily with a meal. 60 tablet 4   metoprolol  tartrate (LOPRESSOR ) 100 MG tablet Take 1 tablet  (100 mg total) by mouth 2 (two) times daily. 60 tablet 9   mupirocin ointment (BACTROBAN) 2 % Apply 1 application topically in the morning and at bedtime. Mole removal site on back     ondansetron  (ZOFRAN ) 8 MG tablet TAKE 1 TABLET BY MOUTH 3 TIMES DAILY 20 tablet 4   pantoprazole  (PROTONIX ) 40 MG tablet Take 1 tablet (40 mg total) by mouth daily. 90 tablet 1   Probiotic Product (PROBIOTIC PO) Take 1 tablet by mouth daily.     Semaglutide , 1 MG/DOSE, (OZEMPIC , 1 MG/DOSE,) 4 MG/3ML SOPN Inject 1 mg into the skin once a week. 3 mL 3   Semaglutide , 1 MG/DOSE, (OZEMPIC , 1 MG/DOSE,) 4 MG/3ML SOPN Inject 1 mg into the skin once a week. 9 mL 2   spironolactone  (ALDACTONE ) 25 MG tablet Take 1 tablet (25 mg total) by mouth 2 (two) times daily. 60 tablet 2   tobramycin -dexamethasone  (TOBRADEX ) ophthalmic solution Place 2 drops in the effected eye 4 times a day 5 mL 0   zinc gluconate 50 MG tablet Take 50 mg by mouth daily as needed (flu season).     Zoster Vaccine Adjuvanted (SHINGRIX ) injection Inject into the muscle. 0.5 mL 1   No current facility-administered medications for this encounter.    ROS- All systems are reviewed and negative except as per the HPI above.  Physical Exam: Vitals:   07/26/23 1129  BP: 110/70  Pulse: 92  Weight: 100.5 kg  Height: 5\' 6"  (1.676 m)    GEN: Well nourished, well developed in no acute distress CARDIAC: Irregularly irregular rate and rhythm, no murmurs, rubs, gallops RESPIRATORY:  Clear to auscultation without rales, wheezing or rhonchi  ABDOMEN: Soft, non-tender, non-distended EXTREMITIES:  No edema; No deformity    Wt Readings from Last 3 Encounters:  07/26/23 100.5 kg  06/22/23 103 kg  04/22/22 107.6 kg    EKG today demonstrates  Afib, LAFB Vent. rate 92 BPM PR interval * ms QRS duration 92 ms QT/QTcB 350/432 ms   Echo 10/16/20 demonstrated   1. Left ventricular ejection fraction, by estimation, is 60 to 65%. The  left ventricle has normal  function. The left ventricle has no regional  wall motion abnormalities. There is mild concentric left ventricular  hypertrophy. Left ventricular diastolic parameters are indeterminate but suggestive of at elevated left atrial pressure.   2. Right ventricular systolic function is normal. The right ventricular  size is normal. Tricuspid regurgitation signal is inadequate for assessing PA pressure.   3. The mitral valve is normal in structure. No evidence of mitral valve regurgitation.   4. The aortic valve is tricuspid. There is mild thickening of the aortic  valve. Aortic valve regurgitation is not visualized.   5. The inferior vena cava is normal in size with greater than 50%  respiratory variability, suggesting right atrial pressure  of 3 mmHg.   Comparison(s): A prior study was performed on 03/10/2020. No significant change from prior study. Prior images reviewed side by side.   Epic records are reviewed at length today  CHA2DS2-VASc Score = 6  The patient's score is based upon: CHF History: 1 HTN History: 1 Diabetes History: 1 Stroke History: 2 Vascular Disease History: 0 Age Score: 0 Gender Score: 1       ASSESSMENT AND PLAN: Paroxysmal Atrial Fibrillation (ICD10:  I48.0) The patient's CHA2DS2-VASc score is 6, indicating a 9.7% annual risk of stroke.   ILR shows 25% afib burden, she is in afib today.  We discussed rhythm control options including AAD and ablation. She would like to avoid more long term medications. Will refer to Dr Marven Slimmer to discuss ablation.  Continue Eliquis  5 mg BID Continue Lopressor  100 mg BID  Secondary Hypercoagulable State (ICD10:  D68.69) The patient is at significant risk for stroke/thromboembolism based upon her CHA2DS2-VASc Score of 6.  Continue Apixaban  (Eliquis ).   Obesity Body mass index is 35.77 kg/m.  Encouraged lifestyle modification On Ozempic   Snoring/daytime somnolence/suspected OSA  The importance of adequate treatment of sleep  apnea was discussed today in order to improve our ability to maintain sinus rhythm long term. Will refer for sleep study.   HTN Stable on current regimen   Follow up with Dr Marven Slimmer for ablation evaluation.    Myrtha Ates PA-C [facility] [address] [place], [place] [address] [phone] 07/26/2023 11:35 AM

## 2023-07-27 ENCOUNTER — Other Ambulatory Visit (HOSPITAL_COMMUNITY): Payer: Self-pay

## 2023-07-27 MED ORDER — FREESTYLE LIBRE 3 SENSOR MISC
3 refills | Status: AC
  Filled 2023-07-27: qty 6, 84d supply, fill #0
  Filled 2023-10-14: qty 6, 84d supply, fill #1
  Filled 2023-12-15 – 2024-01-16 (×2): qty 6, 84d supply, fill #2
  Filled 2024-03-29: qty 4, 56d supply, fill #3
  Filled 2024-04-20: qty 6, 84d supply, fill #3

## 2023-07-28 ENCOUNTER — Other Ambulatory Visit: Payer: Self-pay

## 2023-07-28 ENCOUNTER — Other Ambulatory Visit (HOSPITAL_COMMUNITY): Payer: Self-pay

## 2023-07-28 MED ORDER — METOPROLOL TARTRATE 100 MG PO TABS
100.0000 mg | ORAL_TABLET | Freq: Two times a day (BID) | ORAL | 5 refills | Status: DC
  Filled 2023-07-28: qty 60, 30d supply, fill #0
  Filled 2023-08-24: qty 60, 30d supply, fill #1
  Filled 2023-09-21: qty 60, 30d supply, fill #2
  Filled 2023-10-26: qty 60, 30d supply, fill #3
  Filled 2023-11-20: qty 60, 30d supply, fill #4
  Filled 2023-12-20: qty 60, 30d supply, fill #5

## 2023-07-30 ENCOUNTER — Other Ambulatory Visit (HOSPITAL_COMMUNITY): Payer: Self-pay

## 2023-08-01 ENCOUNTER — Other Ambulatory Visit: Payer: Self-pay

## 2023-08-01 ENCOUNTER — Other Ambulatory Visit (HOSPITAL_COMMUNITY): Payer: Self-pay

## 2023-08-01 MED ORDER — PANTOPRAZOLE SODIUM 40 MG PO TBEC
40.0000 mg | DELAYED_RELEASE_TABLET | Freq: Every day | ORAL | 1 refills | Status: DC
  Filled 2023-08-01: qty 90, 90d supply, fill #0
  Filled 2023-10-26: qty 90, 90d supply, fill #1

## 2023-08-05 NOTE — Progress Notes (Signed)
 Carelink Summary Report / Loop Recorder

## 2023-08-12 ENCOUNTER — Ambulatory Visit: Attending: Cardiology | Admitting: Cardiology

## 2023-08-12 VITALS — BP 110/78 | Ht 66.0 in | Wt 213.0 lb

## 2023-08-12 DIAGNOSIS — I4819 Other persistent atrial fibrillation: Secondary | ICD-10-CM

## 2023-08-12 NOTE — Patient Instructions (Addendum)
 Medication Instructions:  Your physician recommends that you continue on your current medications as directed. Please refer to the Current Medication list given to you today.  *If you need a refill on your cardiac medications before your next appointment, please call your pharmacy*  Lab Work: Lab work within 30 days of procedure  You may go to any [facility] [facility] for your lab work:  [facility] - [id number] ([facility]) - [address] [phone] [address]  [place] - [address]   [place]  - [address]   [place] - [address] - [address] [employer]  - [address] ([facility]   If you have labs (blood work) drawn today and your tests are completely normal, you will receive your results only by: [employer] (if you have MyChart)  If you have any lab test that is abnormal or we need to change your treatment, we will call you or send a MyChart message to review the results.  Testing/Procedures: Atrial fibrillation ablation dates:  August - 5,6,8,11,13,15 The 8th and 15th are fridays  Follow-Up: At [facility], you and your health needs are our priority.  As part of our continuing mission to provide you with exceptional heart care, we have created designated Provider Care Teams.  These Care Teams include your primary Cardiologist (physician) and Advanced Practice Providers (APPs -  Physician Assistants and Nurse Practitioners) who all work together to provide you with the care you need, when you need it.   Your next appointment:   To be scheduled  The format for your next appointment:   In Person  Provider:   Harvie Liner, MD{or one of the following Advanced Practice Providers on your designated Care Team:   Mertha Abrahams, [place] Bambi Lever "Jonelle Neri" [place], [place] Neda Balk, NP  Note: Remote monitoring is used to  monitor your Pacemaker/ ICD from home. This monitoring reduces the number of office visits required to check your device to one time per year. It allows us  to keep an eye on the functioning of your device to ensure it is working properly.

## 2023-08-12 NOTE — Progress Notes (Signed)
  Electrophysiology Office Follow up Visit Note:    Date:  08/12/2023   ID:  Carollynn Mcconnell, DOB 02-Mar-1961, MRN [id number]  PCP:  Danella Dunn, MD  [facility] Cardiologist:  Bridgette Campus, MD  [facility] Electrophysiologist:  Boyce Byes, MD    Interval History:     [name] is a 63 y.o. female who presents for a follow up visit.   The patient was last seen by [facility] July 26, 2023.  She has a history of diabetes, hyperlipidemia, renal cell cancer post cryoablation, TIA, atrial fibrillation.  She had a loop recorder implanted after a TIA in 2022 which demonstrated atrial fibrillation.  Her loop recorder has demonstrated a 25% burden of atrial fibrillation recently.  She takes Eliquis  for stroke prophylaxis.  She is being referred to discuss possible catheter ablation.        Past medical, surgical, social and family history were reviewed.  ROS:   Please see the history of present illness.    All other systems reviewed and are negative.  EKGs/Labs/Other Studies Reviewed:    The following studies were reviewed today:  Loop recorder interrogations reviewed.  Recent loop recorder interrogation shows a 34% burden of A-fib.        Physical Exam:    VS:  There were no vitals taken for this visit.    Wt Readings from Last 3 Encounters:  07/26/23 221 lb 9.6 oz (100.5 kg)  06/22/23 227 lb (103 kg)  04/22/22 237 lb 3.2 oz (107.6 kg)     GEN: no distress CARD: RRR, No MRG RESP: No IWOB. CTAB.      ASSESSMENT:    No diagnosis found. PLAN:    In order of problems listed above:  #Persistent atrial fibrillation Symptomatic.  Increasing burden.  Treatment options were discussed with the patient during today's office visit.  We discussed conservative management, antiarrhythmic drugs and catheter ablation.  She is very interested in avoiding long-term use of medications and is interested in proceeding with catheter ablation.  I discussed the ablation  procedure in detail including the risks, recovery and likelihood of success and she wishes to proceed.  Continue Eliquis   Discussed treatment options today for AF including antiarrhythmic drug therapy and ablation. Discussed risks, recovery and likelihood of success with each treatment strategy. Risk, benefits, and alternatives to EP study and ablation for afib were discussed. These risks include but are not limited to stroke, bleeding, vascular damage, tamponade, perforation, damage to the esophagus, lungs, phrenic nerve and other structures, pulmonary vein stenosis, worsening renal function, coronary vasospasm and death.  Discussed potential need for repeat ablation procedures and antiarrhythmic drugs after an initial ablation. The patient understands these risk and wishes to proceed.  We will therefore proceed with catheter ablation at the next available time.  Carto, ICE, anesthesia are requested for the procedure.  Will also obtain CT PV protocol prior to the procedure to exclude LAA thrombus and further evaluate atrial anatomy.     Signed, Harvie Liner, MD, [facility], [facility] 08/12/2023 8:51 AM    Electrophysiology [facility]

## 2023-08-15 ENCOUNTER — Telehealth: Payer: Self-pay | Admitting: Cardiology

## 2023-08-15 DIAGNOSIS — I4819 Other persistent atrial fibrillation: Secondary | ICD-10-CM

## 2023-08-15 NOTE — Telephone Encounter (Signed)
 Spoke with  pt and she is scheduled for Afib Ablation with Dr. Marven Slimmer on 8/8 at 12:30 PM.   She will have labs done on 7/11... CT is scheduled on 7/18 at 4:00 PM...  Pt takes Ozempic  on Fridays - Last dose will be on Thursday 7/31.  Once completed, I will send Instruction letters via MyChart and mail pt a copy.

## 2023-08-15 NOTE — Telephone Encounter (Signed)
 Will send this message to our EP Procedure Scheduler for further follow-up and assistance in getting pts ablation scheduled for requested time.

## 2023-08-15 NOTE — Telephone Encounter (Signed)
 Pt calling to inform the nurse she would like to have her ablation scheduled on 11/04/2023. Please advise

## 2023-08-18 ENCOUNTER — Other Ambulatory Visit (HOSPITAL_COMMUNITY): Payer: Self-pay

## 2023-08-18 ENCOUNTER — Other Ambulatory Visit: Payer: Self-pay

## 2023-08-18 MED ORDER — LISINOPRIL 10 MG PO TABS
10.0000 mg | ORAL_TABLET | Freq: Every day | ORAL | 1 refills | Status: DC
  Filled 2023-08-18: qty 90, 90d supply, fill #0

## 2023-08-22 ENCOUNTER — Other Ambulatory Visit (HOSPITAL_COMMUNITY): Payer: Self-pay

## 2023-08-24 ENCOUNTER — Other Ambulatory Visit (HOSPITAL_COMMUNITY): Payer: Self-pay

## 2023-08-27 ENCOUNTER — Telehealth: Admitting: Nurse Practitioner

## 2023-08-27 DIAGNOSIS — J019 Acute sinusitis, unspecified: Secondary | ICD-10-CM | POA: Diagnosis not present

## 2023-08-27 DIAGNOSIS — B9689 Other specified bacterial agents as the cause of diseases classified elsewhere: Secondary | ICD-10-CM | POA: Diagnosis not present

## 2023-08-27 MED ORDER — AZELASTINE HCL 137 MCG/SPRAY NA SOLN: 1.0000 | Freq: Two times a day (BID) | NASAL | 0 refills | Status: AC

## 2023-08-27 MED ORDER — DOXYCYCLINE HYCLATE 100 MG PO TABS
100.0000 mg | ORAL_TABLET | Freq: Two times a day (BID) | ORAL | 0 refills | Status: AC
Start: 2023-08-27 — End: 2023-09-03

## 2023-08-27 NOTE — Progress Notes (Signed)
 Virtual Visit Consent   [name], you are scheduled for a virtual visit with a  provider today. Just as with appointments in the office, your consent must be obtained to participate. Your consent will be active for this visit and any virtual visit you may have with one of our providers in the next 365 days. If you have a MyChart account, a copy of this consent can be sent to you electronically.  As this is a virtual visit, video technology does not allow for your provider to perform a traditional examination. This may limit your provider's ability to fully assess your condition. If your provider identifies any concerns that need to be evaluated in person or the need to arrange testing (such as labs, EKG, etc.), we will make arrangements to do so. Although advances in technology are sophisticated, we cannot ensure that it will always work on either your end or our end. If the connection with a video visit is poor, the visit may have to be switched to a telephone visit. With either a video or telephone visit, we are not always able to ensure that we have a secure connection.  By engaging in this virtual visit, you consent to the provision of healthcare and authorize for your insurance to be billed (if applicable) for the services provided during this visit. Depending on your insurance coverage, you may receive a charge related to this service.  I need to obtain your verbal consent now. Are you willing to proceed with your visit today? [name] has provided verbal consent on 08/27/2023 for a virtual visit (video or telephone). Collins Dean, NP  Date: 08/27/2023 10:21 AM   Virtual Visit via Video Note   I, Collins Dean, connected with  [name]  ([id number], [birth year]) on 08/27/23 at 10:15 AM EDT by a video-enabled telemedicine application and verified that I am speaking with the correct person using two identifiers.  Location: Patient: Virtual Visit Location  Patient: Home Provider: Virtual Visit Location Provider: Home Office   I discussed the limitations of evaluation and management by telemedicine and the availability of in person appointments. The patient expressed understanding and agreed to proceed.    History of Present Illness: [name] is a 62 y.o. who identifies as a female who was assigned female at birth, and is being seen today for sinusitis.  [name] the following symptoms for the past 10 days with worsening over the past few days: Headache, frontal and maxillary sinus pressure and pain, dental pain, sinus drainage with foul taste in mouth, congestion, body aches and chillsTaking allegra and sudafed products with no relief.    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 05/16/2016   Fatty liver disease, 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/16/2016   Slurred speech 09/13/2015   Parotitis 09/13/2015   Gastroenteritis 08/21/2015   Hypothyroid 08/21/2015   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   Pain in the chest    Essential hypertension  Chest pain 04/23/2014   Anginal pain (HCC) 04/23/2014   Type 2 diabetes mellitus (HCC)    Hypertension    Hypercholesteremia     Allergies:  Allergies  Allergen Reactions   Amoxicillin  Other (See Comments) and Anaphylaxis    Resulted in ambulance per pt   Lorabid [Loracarbef] Anaphylaxis   Augmentin  [Amoxicillin -Pot Clavulanate]  Hives, Nausea And Vomiting and Other (See Comments)    Chest pain   Codeine Nausea And Vomiting   Furosemide  Swelling    Patient states has reaction based on sulfa allergy   Gadolinium Derivatives Itching, Swelling and Other (See Comments)    Patient will require 13 hr prep prior to administration of gadolinium     Sulfa Drugs Cross Reactors Other (See Comments)    Severe allergic reaction as a child - was not told symptoms... states she has had sulfa reaction to furosemide    Benicar [Olmesartan Medoxomil] Swelling and Rash   Other Rash    Green peas   Medications:  Current Outpatient Medications:    Azelastine HCl 137 MCG/SPRAY SOLN, Place 1 spray into the nose 2 (two) times daily., Disp: 30 mL, Rfl: 0   doxycycline  (VIBRA -TABS) 100 MG tablet, Take 1 tablet (100 mg total) by mouth 2 (two) times daily for 7 days., Disp: 14 tablet, Rfl: 0   acetaminophen  (TYLENOL ) 500 MG tablet, Take 2 tablets (1,000 mg total) by mouth every 6 (six) hours as needed (body aches)., Disp: 30 tablet, Rfl: 0   albuterol  (VENTOLIN  HFA) 108 (90 Base) MCG/ACT inhaler, Inhale 2 puffs every 4 to 6 hours as needed., Disp: 18 g, Rfl: 2   apixaban  (ELIQUIS ) 5 MG TABS tablet, Take 1 tablet (5 mg total) by mouth 2 (two) times daily., Disp: 60 tablet, Rfl: 5   atorvastatin  (LIPITOR) 80 MG tablet, Take 1 tablet (80 mg total) by mouth at bedtime., Disp: 90 tablet, Rfl: 3   CALCIUM -MAGNESIUM -ZINC PO, Take 2 tablets by mouth 2 (two) times daily., Disp: , Rfl:    Continuous Glucose Sensor (FREESTYLE LIBRE 3 SENSOR) MISC, Change sensor every two weeks, Disp: 6 each, Rfl: 3   diclofenac  Sodium (VOLTAREN ) 1 % GEL, Apply to affected area(s) twice daily as directed, Disp: 100 g, Rfl: 0   diphenhydrAMINE  (BENADRYL ) 25 MG tablet, Take 25 mg by mouth at bedtime., Disp: , Rfl:    ibuprofen  (ADVIL ) 200 MG tablet, Take 800 mg by mouth every 8 (eight) hours as needed for headache or moderate pain., Disp: , Rfl:    levothyroxine  (SYNTHROID )  150 MCG tablet, Take 1 tablet (150 mcg total) by mouth daily., Disp: 90 tablet, Rfl: 3   lisinopril  (ZESTRIL ) 10 MG tablet, Take 1 tablet (10 mg total) by mouth daily., Disp: 90 tablet, Rfl: 1   metFORMIN  (GLUCOPHAGE ) 500 MG tablet, Take 1 tablet (500 mg total) by mouth 2 (two) times daily with a meal., Disp: 60 tablet, Rfl: 4   metoprolol  tartrate (LOPRESSOR ) 100 MG tablet, Take 1 tablet (100 mg total) by mouth 2 (two) times daily., Disp: 60 tablet, Rfl: 5   mupirocin ointment (BACTROBAN) 2 %, Apply 1 application topically in the morning and at bedtime. Mole removal site on back, Disp: , Rfl:    ondansetron  (ZOFRAN ) 8 MG tablet, TAKE 1 TABLET BY MOUTH 3 TIMES DAILY, Disp: 20 tablet, Rfl: 4   pantoprazole  (PROTONIX ) 40 MG tablet, Take 1 tablet (40 mg total) by mouth daily., Disp: 90 tablet, Rfl: 1   Probiotic Product (PROBIOTIC PO), Take 1 tablet  by mouth daily., Disp: , Rfl:    Semaglutide , 1 MG/DOSE, (OZEMPIC , 1 MG/DOSE,) 4 MG/3ML SOPN, Inject 1 mg into the skin once a week., Disp: 3 mL, Rfl: 3   Semaglutide , 1 MG/DOSE, (OZEMPIC , 1 MG/DOSE,) 4 MG/3ML SOPN, Inject 1 mg into the skin once a week., Disp: 9 mL, Rfl: 2   spironolactone  (ALDACTONE ) 25 MG tablet, Take 1 tablet (25 mg total) by mouth 2 (two) times daily., Disp: 60 tablet, Rfl: 2   tobramycin -dexamethasone  (TOBRADEX ) ophthalmic solution, Place 2 drops in the effected eye 4 times a day, Disp: 5 mL, Rfl: 0   zinc gluconate 50 MG tablet, Take 50 mg by mouth daily as needed (flu season)., Disp: , Rfl:    Zoster Vaccine Adjuvanted (SHINGRIX ) injection, Inject into the muscle., Disp: 0.5 mL, Rfl: 1  Observations/Objective: Patient is well-developed, well-nourished in no acute distress.  Resting comfortably  at home.  Head is normocephalic, atraumatic.  No labored breathing.  Speech is clear and coherent with logical content.  Patient is alert and oriented at baseline.    Assessment and Plan: 1. Acute bacterial sinusitis (Primary) -  doxycycline  (VIBRA -TABS) 100 MG tablet; Take 1 tablet (100 mg total) by mouth 2 (two) times daily for 7 days.  Dispense: 14 tablet; Refill: 0 - Azelastine HCl 137 MCG/SPRAY SOLN; Place 1 spray into the nose 2 (two) times daily.  Dispense: 30 mL; Refill: 0    Follow Up Instructions: I discussed the assessment and treatment plan with the patient. The patient was provided an opportunity to ask questions and all were answered. The patient agreed with the plan and demonstrated an understanding of the instructions.  A copy of instructions were sent to the patient via MyChart unless otherwise noted below.    The patient was advised to call back or seek an in-person evaluation if the symptoms worsen or if the condition fails to improve as anticipated.    Kiaan Overholser W Anjolie Majer, NP

## 2023-08-27 NOTE — Patient Instructions (Signed)
 [name], thank you for joining Collins Dean, NP for today's virtual visit.  While this provider is not your primary care provider (PCP), if your PCP is located in our provider database this encounter information will be shared with them immediately following your visit.   A [facility] MyChart account gives you access to today's visit and all your visits, tests, and labs performed at [facility] " click here if you don't have a [facility] MyChart account or go to [email or web address]  Consent: (Patient) [name] provided verbal consent for this virtual visit at the beginning of the encounter.  Current Medications:  Current Outpatient Medications:    Azelastine HCl 137 MCG/SPRAY SOLN, Place 1 spray into the nose 2 (two) times daily., Disp: 30 mL, Rfl: 0   doxycycline  (VIBRA -TABS) 100 MG tablet, Take 1 tablet (100 mg total) by mouth 2 (two) times daily for 7 days., Disp: 14 tablet, Rfl: 0   acetaminophen  (TYLENOL ) 500 MG tablet, Take 2 tablets (1,000 mg total) by mouth every 6 (six) hours as needed (body aches)., Disp: 30 tablet, Rfl: 0   albuterol  (VENTOLIN  HFA) 108 (90 Base) MCG/ACT inhaler, Inhale 2 puffs every 4 to 6 hours as needed., Disp: 18 g, Rfl: 2   apixaban  (ELIQUIS ) 5 MG TABS tablet, Take 1 tablet (5 mg total) by mouth 2 (two) times daily., Disp: 60 tablet, Rfl: 5   atorvastatin  (LIPITOR) 80 MG tablet, Take 1 tablet (80 mg total) by mouth at bedtime., Disp: 90 tablet, Rfl: 3   CALCIUM -MAGNESIUM -ZINC PO, Take 2 tablets by mouth 2 (two) times daily., Disp: , Rfl:    Continuous Glucose Sensor (FREESTYLE LIBRE 3 SENSOR) MISC, Change sensor every two weeks, Disp: 6 each, Rfl: 3   diclofenac  Sodium (VOLTAREN ) 1 % GEL, Apply to affected area(s) twice daily as directed, Disp: 100 g, Rfl: 0   diphenhydrAMINE  (BENADRYL ) 25 MG tablet, Take 25 mg by mouth at bedtime., Disp: , Rfl:    ibuprofen  (ADVIL ) 200 MG tablet, Take 800 mg by mouth every 8 (eight) hours as  needed for headache or moderate pain., Disp: , Rfl:    levothyroxine  (SYNTHROID ) 150 MCG tablet, Take 1 tablet (150 mcg total) by mouth daily., Disp: 90 tablet, Rfl: 3   lisinopril  (ZESTRIL ) 10 MG tablet, Take 1 tablet (10 mg total) by mouth daily., Disp: 90 tablet, Rfl: 1   metFORMIN  (GLUCOPHAGE ) 500 MG tablet, Take 1 tablet (500 mg total) by mouth 2 (two) times daily with a meal., Disp: 60 tablet, Rfl: 4   metoprolol  tartrate (LOPRESSOR ) 100 MG tablet, Take 1 tablet (100 mg total) by mouth 2 (two) times daily., Disp: 60 tablet, Rfl: 5   mupirocin ointment (BACTROBAN) 2 %, Apply 1 application topically in the morning and at bedtime. Mole removal site on back, Disp: , Rfl:    ondansetron  (ZOFRAN ) 8 MG tablet, TAKE 1 TABLET BY MOUTH 3 TIMES DAILY, Disp: 20 tablet, Rfl: 4   pantoprazole  (PROTONIX ) 40 MG tablet, Take 1 tablet (40 mg total) by mouth daily., Disp: 90 tablet, Rfl: 1   Probiotic Product (PROBIOTIC PO), Take 1 tablet by mouth daily., Disp: , Rfl:    Semaglutide , 1 MG/DOSE, (OZEMPIC , 1 MG/DOSE,) 4 MG/3ML SOPN, Inject 1 mg into the skin once a week., Disp: 3 mL, Rfl: 3   Semaglutide , 1 MG/DOSE, (OZEMPIC , 1 MG/DOSE,) 4 MG/3ML SOPN, Inject 1 mg into the skin once a week., Disp: 9 mL, Rfl: 2   spironolactone  (ALDACTONE ) 25 MG  tablet, Take 1 tablet (25 mg total) by mouth 2 (two) times daily., Disp: 60 tablet, Rfl: 2   tobramycin -dexamethasone  (TOBRADEX ) ophthalmic solution, Place 2 drops in the effected eye 4 times a day, Disp: 5 mL, Rfl: 0   zinc gluconate 50 MG tablet, Take 50 mg by mouth daily as needed (flu season)., Disp: , Rfl:    Zoster Vaccine Adjuvanted (SHINGRIX ) injection, Inject into the muscle., Disp: 0.5 mL, Rfl: 1   Medications ordered in this encounter:  Meds ordered this encounter  Medications   doxycycline  (VIBRA -TABS) 100 MG tablet    Sig: Take 1 tablet (100 mg total) by mouth 2 (two) times daily for 7 days.    Dispense:  14 tablet    Refill:  0    Supervising Provider:    LAMPTEY, PHILIP O [[id number]]   Azelastine HCl 137 MCG/SPRAY SOLN    Sig: Place 1 spray into the nose 2 (two) times daily.    Dispense:  30 mL    Refill:  0    Supervising Provider:   Corine Dice [[id number]]     *If you need refills on other medications prior to your next appointment, please contact your pharmacy*  Follow-Up: Call back or seek an in-person evaluation if the symptoms worsen or if the condition fails to improve as anticipated.  [facility] [phone]    If you have been instructed to have an in-person evaluation today at a local Urgent Care facility, please use the link below. It will take you to a list of all of our available [facility], including address, phone number and hours of operation. Please do not delay care.  [facility]  If you or a family member do not have a primary care provider, use the link below to schedule a visit and establish care. When you choose a [facility] primary care physician or advanced practice provider, you gain a long-term partner in health. Find a Primary Care Provider  Learn more about [facility] in-office and virtual care options: [facility] - Get Care Now

## 2023-08-29 ENCOUNTER — Ambulatory Visit (INDEPENDENT_AMBULATORY_CARE_PROVIDER_SITE_OTHER)

## 2023-08-29 ENCOUNTER — Ambulatory Visit: Payer: Self-pay | Admitting: Cardiology

## 2023-08-29 DIAGNOSIS — G459 Transient cerebral ischemic attack, unspecified: Secondary | ICD-10-CM | POA: Diagnosis not present

## 2023-08-29 LAB — CUP PACEART REMOTE DEVICE CHECK
Date Time Interrogation Session: 20250601231550
Implantable Pulse Generator Implant Date: 20220307

## 2023-09-13 NOTE — Progress Notes (Signed)
 Carelink Summary Report / Loop Recorder

## 2023-09-19 ENCOUNTER — Other Ambulatory Visit (HOSPITAL_COMMUNITY): Payer: Self-pay

## 2023-09-21 ENCOUNTER — Other Ambulatory Visit: Payer: Self-pay

## 2023-09-29 ENCOUNTER — Ambulatory Visit

## 2023-09-29 DIAGNOSIS — G459 Transient cerebral ischemic attack, unspecified: Secondary | ICD-10-CM | POA: Diagnosis not present

## 2023-09-29 LAB — CUP PACEART REMOTE DEVICE CHECK
Date Time Interrogation Session: 20250702231637
Implantable Pulse Generator Implant Date: 20220307

## 2023-09-30 ENCOUNTER — Other Ambulatory Visit (HOSPITAL_COMMUNITY): Payer: Self-pay

## 2023-10-01 ENCOUNTER — Other Ambulatory Visit (HOSPITAL_COMMUNITY): Payer: Self-pay

## 2023-10-03 ENCOUNTER — Other Ambulatory Visit: Payer: Self-pay

## 2023-10-03 ENCOUNTER — Other Ambulatory Visit (HOSPITAL_COMMUNITY): Payer: Self-pay

## 2023-10-03 MED ORDER — SPIRONOLACTONE 25 MG PO TABS
25.0000 mg | ORAL_TABLET | Freq: Two times a day (BID) | ORAL | 3 refills | Status: DC
  Filled 2023-10-03: qty 60, 30d supply, fill #0

## 2023-10-08 ENCOUNTER — Telehealth: Payer: Self-pay | Admitting: Physician Assistant

## 2023-10-08 ENCOUNTER — Ambulatory Visit: Payer: Self-pay | Admitting: Cardiology

## 2023-10-08 ENCOUNTER — Encounter (HOSPITAL_BASED_OUTPATIENT_CLINIC_OR_DEPARTMENT_OTHER): Payer: Self-pay

## 2023-10-08 ENCOUNTER — Observation Stay (HOSPITAL_BASED_OUTPATIENT_CLINIC_OR_DEPARTMENT_OTHER)
Admission: EM | Admit: 2023-10-08 | Discharge: 2023-10-09 | Disposition: A | Discharge status: Home / Self Care | Source: Ambulatory Visit | Attending: Internal Medicine | Admitting: Internal Medicine

## 2023-10-08 ENCOUNTER — Other Ambulatory Visit: Payer: Self-pay

## 2023-10-08 DIAGNOSIS — E875 Hyperkalemia: Principal | ICD-10-CM | POA: Diagnosis present

## 2023-10-08 DIAGNOSIS — Z6836 Body mass index (BMI) 36.0-36.9, adult: Secondary | ICD-10-CM | POA: Insufficient documentation

## 2023-10-08 DIAGNOSIS — I2510 Atherosclerotic heart disease of native coronary artery without angina pectoris: Secondary | ICD-10-CM | POA: Diagnosis not present

## 2023-10-08 DIAGNOSIS — E7800 Pure hypercholesterolemia, unspecified: Secondary | ICD-10-CM | POA: Diagnosis present

## 2023-10-08 DIAGNOSIS — Z8673 Personal history of transient ischemic attack (TIA), and cerebral infarction without residual deficits: Secondary | ICD-10-CM | POA: Insufficient documentation

## 2023-10-08 DIAGNOSIS — J45909 Unspecified asthma, uncomplicated: Secondary | ICD-10-CM | POA: Insufficient documentation

## 2023-10-08 DIAGNOSIS — Z7901 Long term (current) use of anticoagulants: Secondary | ICD-10-CM | POA: Diagnosis not present

## 2023-10-08 DIAGNOSIS — E66812 Obesity, class 2: Secondary | ICD-10-CM | POA: Diagnosis present

## 2023-10-08 DIAGNOSIS — I480 Paroxysmal atrial fibrillation: Secondary | ICD-10-CM | POA: Diagnosis not present

## 2023-10-08 DIAGNOSIS — I110 Hypertensive heart disease with heart failure: Secondary | ICD-10-CM | POA: Insufficient documentation

## 2023-10-08 DIAGNOSIS — E871 Hypo-osmolality and hyponatremia: Secondary | ICD-10-CM | POA: Insufficient documentation

## 2023-10-08 DIAGNOSIS — I10 Essential (primary) hypertension: Secondary | ICD-10-CM | POA: Diagnosis present

## 2023-10-08 DIAGNOSIS — Z7984 Long term (current) use of oral hypoglycemic drugs: Secondary | ICD-10-CM | POA: Insufficient documentation

## 2023-10-08 DIAGNOSIS — E119 Type 2 diabetes mellitus without complications: Secondary | ICD-10-CM | POA: Diagnosis not present

## 2023-10-08 DIAGNOSIS — Z85528 Personal history of other malignant neoplasm of kidney: Secondary | ICD-10-CM | POA: Insufficient documentation

## 2023-10-08 DIAGNOSIS — E039 Hypothyroidism, unspecified: Secondary | ICD-10-CM | POA: Diagnosis present

## 2023-10-08 DIAGNOSIS — R799 Abnormal finding of blood chemistry, unspecified: Secondary | ICD-10-CM | POA: Diagnosis present

## 2023-10-08 DIAGNOSIS — K760 Fatty (change of) liver, not elsewhere classified: Secondary | ICD-10-CM | POA: Diagnosis present

## 2023-10-08 DIAGNOSIS — I509 Heart failure, unspecified: Secondary | ICD-10-CM | POA: Insufficient documentation

## 2023-10-08 DIAGNOSIS — Z8679 Personal history of other diseases of the circulatory system: Secondary | ICD-10-CM

## 2023-10-08 DIAGNOSIS — Z85850 Personal history of malignant neoplasm of thyroid: Secondary | ICD-10-CM | POA: Insufficient documentation

## 2023-10-08 LAB — CBC WITH DIFFERENTIAL/PLATELET
Abs Immature Granulocytes: 0.03 K/uL (ref 0.00–0.07)
Basophils Absolute: 0 K/uL (ref 0.0–0.1)
Basophils Relative: 0 %
Eosinophils Absolute: 0.1 K/uL (ref 0.0–0.5)
Eosinophils Relative: 1 %
HCT: 35.5 % — ABNORMAL LOW (ref 36.0–46.0)
Hemoglobin: 11.6 g/dL — ABNORMAL LOW (ref 12.0–15.0)
Immature Granulocytes: 0 %
Lymphocytes Relative: 24 %
Lymphs Abs: 2 K/uL (ref 0.7–4.0)
MCH: 28.8 pg (ref 26.0–34.0)
MCHC: 32.7 g/dL (ref 30.0–36.0)
MCV: 88.1 fL (ref 80.0–100.0)
Monocytes Absolute: 0.6 K/uL (ref 0.1–1.0)
Monocytes Relative: 7 %
Neutro Abs: 5.7 K/uL (ref 1.7–7.7)
Neutrophils Relative %: 68 %
Platelets: 340 K/uL (ref 150–400)
RBC: 4.03 MIL/uL (ref 3.87–5.11)
RDW: 13.8 % (ref 11.5–15.5)
WBC: 8.5 K/uL (ref 4.0–10.5)
nRBC: 0 % (ref 0.0–0.2)

## 2023-10-08 LAB — CBC
Hematocrit: 37.8 % (ref 34.0–46.6)
Hemoglobin: 12 g/dL (ref 11.1–15.9)
MCH: 28.3 pg (ref 26.6–33.0)
MCHC: 31.7 g/dL (ref 31.5–35.7)
MCV: 89 fL (ref 79–97)
Platelets: 450 x10E3/uL (ref 150–450)
RBC: 4.24 x10E6/uL (ref 3.77–5.28)
RDW: 13.2 % (ref 11.7–15.4)
WBC: 9.3 x10E3/uL (ref 3.4–10.8)

## 2023-10-08 LAB — GLUCOSE, CAPILLARY
Glucose-Capillary: 183 mg/dL — ABNORMAL HIGH (ref 70–99)
Glucose-Capillary: 205 mg/dL — ABNORMAL HIGH (ref 70–99)
Glucose-Capillary: 195 mg/dL — ABNORMAL HIGH (ref 70–99)

## 2023-10-08 LAB — BASIC METABOLIC PANEL WITH GFR
Anion gap: 12 (ref 5–15)
Anion gap: 9 (ref 5–15)
BUN/Creatinine Ratio: 17 (ref 12–28)
BUN: 20 mg/dL (ref 8–27)
BUN: 21 mg/dL (ref 8–23)
BUN: 21 mg/dL (ref 8–23)
CO2: 17 mmol/L — ABNORMAL LOW (ref 20–29)
CO2: 19 mmol/L — ABNORMAL LOW (ref 22–32)
CO2: 21 mmol/L — ABNORMAL LOW (ref 22–32)
Calcium: 9 mg/dL (ref 8.7–10.3)
Calcium: 8.9 mg/dL (ref 8.9–10.3)
Calcium: 8.4 mg/dL — ABNORMAL LOW (ref 8.9–10.3)
Chloride: 95 mmol/L — ABNORMAL LOW (ref 96–106)
Chloride: 96 mmol/L — ABNORMAL LOW (ref 98–111)
Chloride: 99 mmol/L (ref 98–111)
Creatinine, Ser: 1.17 mg/dL — ABNORMAL HIGH (ref 0.57–1.00)
Creatinine, Ser: 1.29 mg/dL — ABNORMAL HIGH (ref 0.44–1.00)
Creatinine, Ser: 1.09 mg/dL — ABNORMAL HIGH (ref 0.44–1.00)
GFR, Estimated: 47 mL/min — ABNORMAL LOW (ref >60)
GFR, Estimated: 58 mL/min — ABNORMAL LOW (ref >60)
Glucose, Bld: 255 mg/dL — ABNORMAL HIGH (ref 70–99)
Glucose, Bld: 226 mg/dL — ABNORMAL HIGH (ref 70–99)
Glucose: 220 mg/dL — ABNORMAL HIGH (ref 70–99)
Potassium: 5.8 mmol/L (ref 3.5–5.2)
Potassium: 5.3 mmol/L — ABNORMAL HIGH (ref 3.5–5.1)
Potassium: 4.5 mmol/L (ref 3.5–5.1)
Sodium: 127 mmol/L — ABNORMAL LOW (ref 134–144)
Sodium: 127 mmol/L — ABNORMAL LOW (ref 135–145)
Sodium: 129 mmol/L — ABNORMAL LOW (ref 135–145)
eGFR: 53 mL/min/1.73 — ABNORMAL LOW (ref >59)

## 2023-10-08 MED ORDER — SODIUM BICARBONATE 8.4 % IV SOLN
50.0000 meq | Freq: Once | INTRAVENOUS | Status: AC
  Administered 2023-10-08: 50 meq via INTRAVENOUS
  Filled 2023-10-08: qty 50

## 2023-10-08 MED ORDER — ONDANSETRON HCL 4 MG PO TABS: 4.0000 mg | ORAL_TABLET | Freq: Four times a day (QID) | ORAL | Status: DC | PRN

## 2023-10-08 MED ORDER — ACETAMINOPHEN 325 MG PO TABS: 650.0000 mg | ORAL_TABLET | Freq: Four times a day (QID) | ORAL | Status: DC | PRN

## 2023-10-08 MED ORDER — METOPROLOL TARTRATE 50 MG PO TABS
100.0000 mg | ORAL_TABLET | Freq: Two times a day (BID) | ORAL | Status: DC
  Administered 2023-10-08 – 2023-10-09 (×2): 100 mg via ORAL
  Filled 2023-10-08 (×2): qty 2

## 2023-10-08 MED ORDER — ATORVASTATIN CALCIUM 40 MG PO TABS
80.0000 mg | ORAL_TABLET | Freq: Every day | ORAL | Status: DC
  Administered 2023-10-08: 80 mg via ORAL
  Filled 2023-10-08: qty 2

## 2023-10-08 MED ORDER — INSULIN ASPART 100 UNIT/ML IJ SOLN: 0.0000 [IU] | Freq: Every day | INTRAMUSCULAR | Status: DC

## 2023-10-08 MED ORDER — CALCIUM GLUCONATE-NACL 1-0.675 GM/50ML-% IV SOLN
1.0000 g | Freq: Once | INTRAVENOUS | Status: AC
  Administered 2023-10-08: 1000 mg via INTRAVENOUS
  Filled 2023-10-08: qty 50

## 2023-10-08 MED ORDER — ETHACRYNIC ACID 25 MG PO TABS: 25.0000 mg | ORAL_TABLET | Freq: Every day | ORAL | Status: DC

## 2023-10-08 MED ORDER — SODIUM CHLORIDE 0.9 % IV BOLUS: 500.0000 mL | Freq: Once | INTRAVENOUS | Status: DC

## 2023-10-08 MED ORDER — INSULIN ASPART 100 UNIT/ML IJ SOLN
0.0000 [IU] | Freq: Three times a day (TID) | INTRAMUSCULAR | Status: DC
  Administered 2023-10-09 (×2): 2 [IU] via SUBCUTANEOUS

## 2023-10-08 MED ORDER — ACETAMINOPHEN 650 MG RE SUPP
650.0000 mg | Freq: Four times a day (QID) | RECTAL | Status: DC | PRN
Start: 2023-10-08 — End: 2023-10-09

## 2023-10-08 MED ORDER — SODIUM ZIRCONIUM CYCLOSILICATE 5 G PO PACK: 5.0000 g | PACK | Freq: Once | ORAL | Status: DC

## 2023-10-08 MED ORDER — ONDANSETRON HCL 4 MG/2ML IJ SOLN: 4.0000 mg | Freq: Four times a day (QID) | INTRAMUSCULAR | Status: DC | PRN

## 2023-10-08 MED ORDER — SODIUM ZIRCONIUM CYCLOSILICATE 10 G PO PACK: 10.0000 g | PACK | Freq: Every day | ORAL | Status: DC

## 2023-10-08 MED ORDER — SODIUM ZIRCONIUM CYCLOSILICATE 10 G PO PACK
10.0000 g | PACK | Freq: Once | ORAL | Status: AC
  Administered 2023-10-08: 10 g via ORAL
  Filled 2023-10-08: qty 1

## 2023-10-08 MED ORDER — SODIUM CHLORIDE 0.9 % IV BOLUS
250.0000 mL | Freq: Once | INTRAVENOUS | Status: AC
  Administered 2023-10-08: 250 mL via INTRAVENOUS

## 2023-10-08 MED ORDER — LEVOTHYROXINE SODIUM 75 MCG PO TABS
150.0000 ug | ORAL_TABLET | Freq: Every day | ORAL | Status: DC
  Administered 2023-10-09: 150 ug via ORAL
  Filled 2023-10-08: qty 2

## 2023-10-08 MED ORDER — APIXABAN 5 MG PO TABS
5.0000 mg | ORAL_TABLET | Freq: Two times a day (BID) | ORAL | Status: DC
  Administered 2023-10-08 – 2023-10-09 (×2): 5 mg via ORAL
  Filled 2023-10-08 (×2): qty 1

## 2023-10-08 NOTE — H&P (Signed)
 History and Physical    Patient: [name] FMW:[id number] DOB: Apr 01, 1960 DOA: 10/08/2023 DOS: the patient was seen and examined on 10/08/2023 PCP: Ilah Crigler, MD  Patient coming from: Home  Chief Complaint:  Chief Complaint  Patient presents with   Abnormal Labs   HPI: [name] is a 63 y.o. female with medical history significant of anemia, contractures of the ankle, asthma, thyroid  cancer, type 2 diabetes, hyperlipidemia, hypertension, right renal cancer, hypothyroidism, TIA who was referred to the emergency department by cardiology after evaluation yesterday showed hyperkalemia 5.8 mmol/L and hyponatremia.  The patient stated she had an episode of dry heaving and and 4 episodes of diarrhea yesterday.  She also has a headache.   No abdominal pain, constipation, melena or hematochezia.  No flank pain, dysuria, frequency or hematuria.  She denied fever, chills, rhinorrhea, sore throat, wheezing or hemoptysis.  No chest pain, palpitations, diaphoresis, PND, orthopnea, but occasionally develops pitting edema of the lower extremities.  No polyuria, polydipsia, polyphagia or blurred vision.   Lab work: CBC showed white count of 8.5, hemoglobin 11.6 g/dL platelets 659.  BMP showed a corrected sodium 131, potassium 5.3, chloride 96 and CO2 19 mmol/L.  Glucose was 255, BUN 21, creatinine 1.29 calcium  8.9 mg/dL.   ED course: Initial vital signs were temperature 97.9 F pulse 82, respiration 20, BP 104/80 mmHg O2 sat 100%.  The patient received sodium bicarbonate , 250 mL of normal saline and 10 g of Lokelma  p.o.  Review of Systems: As mentioned in the history of present illness. All other systems reviewed and are negative. 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    Asthma    due to allergies    Cancer (HCC)    THRYOID 2016 TX WITH SURGERY, KIDNEY CANCER MAY 2018  RIGHT RENAL CRYOABLATION   Complication of anesthesia    Diabetes  mellitus    TYPE 2   Family history of adverse reaction to anesthesia    difficult for father to wake after surgery, AFTHER LOW BP WITH ANESTHESIA   HOH (hard of hearing)    Hypercholesteremia    Hypertension    PONV (postoperative nausea and vomiting)    Renal cancer, right (HCC) dx'd 07/2018, 10/2018   ablation x 2    Thyroid  disease    TIA (transient ischemic attack)    LAST TIA  APRIL 2020 ON PLAVIX , reports residual effects ,02/2020   Past Surgical History:  Procedure Laterality Date   ABDOMINAL HYSTERECTOMY     COMPLETE   BIOPSY  03/02/2019   Procedure: BIOPSY;  Surgeon: Rollin Dover, MD;  Location: [facility];  Service: Endoscopy;;   CHOLECYSTECTOMY     COLON SURGERY     COLONOSCOPY WITH PROPOFOL  N/A 03/02/2019   Procedure: COLONOSCOPY WITH PROPOFOL ;  Surgeon: Rollin Dover, MD;  Location: [facility];  Service: Endoscopy;  Laterality: N/A;   ear surgeries      implantable loop recorder placement  06/02/2020   Medtronic Reveal Linq model LNQ22 implantable loop recorder (SN [id number])    IR RADIOLOGIST EVAL & MGMT  01/12/2017   IR RADIOLOGIST EVAL & MGMT  06/16/2017   IR RADIOLOGIST EVAL & MGMT  08/31/2017   IR RADIOLOGIST EVAL & MGMT  12/13/2017   IR RADIOLOGIST EVAL & MGMT  03/16/2018   IR RADIOLOGIST EVAL & MGMT  11/01/2018   IR RADIOLOGIST EVAL & MGMT  12/13/2018   IR RADIOLOGIST EVAL & MGMT  02/15/2019   IR RADIOLOGIST EVAL & MGMT  08/16/2019   IR RADIOLOGIST EVAL & MGMT  11/22/2019   IR RADIOLOGIST EVAL & MGMT  06/05/2020   IR RADIOLOGIST EVAL & MGMT  11/27/2020   IR RADIOLOGIST EVAL & MGMT  11/19/2021   KNEE ARTHROSCOPY     LEFT HEART CATHETERIZATION WITH CORONARY ANGIOGRAM N/A 04/24/2014   Procedure: LEFT HEART CATHETERIZATION WITH CORONARY ANGIOGRAM;  Surgeon: Lonni JONETTA Cash, MD;  Location: [facility] CATH LAB;  Service: Cardiovascular;  Laterality: N/A;   POLYPECTOMY  03/02/2019   Procedure: POLYPECTOMY;  Surgeon: Rollin Dover, MD;  Location: [facility];  Service:  Endoscopy;;   RADIOFREQUENCY ABLATION Right 08/05/2017   Procedure: CT RENAL CRYO AND BIOPSY;  Surgeon: Adele Rush, MD;  Location: [facility];  Service: Anesthesiology;  Laterality: Right;   RADIOFREQUENCY ABLATION Right 11/15/2018   Procedure: RIGHT RENAL CRYOABLATION;  Surgeon: Adele Rush, MD;  Location: [facility];  Service: Anesthesiology;  Laterality: Right;   THYROIDECTOMY N/A 09/20/2014   Procedure: TOTAL THYROIDECTOMY;  Surgeon: Krystal Spinner, MD;  Location: [facility];  Service: General;  Laterality: N/A;   TONSILLECTOMY     Social History:  reports that she has never smoked. She has never used smokeless tobacco. She reports that she does not drink alcohol and does not use drugs.  Allergies  Allergen Reactions   Amoxicillin  Other (See Comments) and Anaphylaxis    Resulted in ambulance per pt   Lorabid [Loracarbef] Anaphylaxis   Augmentin  [Amoxicillin -Pot Clavulanate] Hives, Nausea And Vomiting and Other (See Comments)    Chest pain   Codeine Nausea And Vomiting   Furosemide  Swelling    Patient states has reaction based on sulfa allergy   Gadolinium Derivatives Itching, Swelling and Other (See Comments)    Patient will require 13 hr prep prior to administration of gadolinium     Sulfa Drugs Cross Reactors Other (See Comments)    Severe allergic reaction as a child - was not told symptoms... states she has had sulfa reaction to furosemide    Benicar [Olmesartan Medoxomil] Swelling and Rash   Other Rash    Green peas    Family History  Problem Relation Age of Onset   Heart failure Mother    Diabetes Mother    Stroke Mother    Stroke Father    Heart failure Father    Cancer Father     Prior to Admission medications   Medication Sig Start Date End Date Taking? Authorizing Provider  acetaminophen  (TYLENOL ) 500 MG tablet Take 2 tablets (1,000 mg total) by mouth every 6 (six) hours as needed (body aches). 06/29/17   Vann, Jessica U, DO  albuterol  (VENTOLIN  HFA) 108 (90 Base) MCG/ACT inhaler  Inhale 2 puffs every 4 to 6 hours as needed. 12/22/20   Ilah Crigler, MD  apixaban  (ELIQUIS ) 5 MG TABS tablet Take 1 tablet (5 mg total) by mouth 2 (two) times daily. 07/06/23   Fenton, Clint R, PA  atorvastatin  (LIPITOR) 80 MG tablet Take 1 tablet (80 mg total) by mouth at bedtime. 07/06/23   Alvan Ronal BRAVO, MD  Azelastine  HCl 137 MCG/SPRAY SOLN Place 1 spray into the nose 2 (two) times daily. 08/27/23   Fleming, Zelda W, NP  CALCIUM -MAGNESIUM -ZINC PO Take 2 tablets by mouth 2 (two) times daily.    [provider]  Continuous Glucose Sensor (FREESTYLE LIBRE 3 SENSOR) MISC Change sensor every two weeks 07/26/23     diclofenac   Sodium (VOLTAREN ) 1 % GEL Apply to affected area(s) twice daily as directed 10/16/20   Gawaluck, Gre[place], MD  diphenhydrAMINE  (BENADRYL ) 25 MG tablet Take 25 mg by mouth at bedtime.    [provider]  ibuprofen  (ADVIL ) 200 MG tablet Take 800 mg by mouth every 8 (eight) hours as needed for headache or moderate pain.    [provider]  levothyroxine  (SYNTHROID ) 150 MCG tablet Take 1 tablet (150 mcg total) by mouth daily. 07/22/23     lisinopril  (ZESTRIL ) 10 MG tablet Take 1 tablet (10 mg total) by mouth daily. 08/18/23     metFORMIN  (GLUCOPHAGE ) 500 MG tablet Take 1 tablet (500 mg total) by mouth 2 (two) times daily with a meal. 05/26/23     metoprolol  tartrate (LOPRESSOR ) 100 MG tablet Take 1 tablet (100 mg total) by mouth 2 (two) times daily. 07/28/23   Rana Lum CROME, NP  mupirocin ointment (BACTROBAN) 2 % Apply 1 application topically in the morning and at bedtime. Mole removal site on back 09/16/20   [provider]  ondansetron  (ZOFRAN ) 8 MG tablet TAKE 1 TABLET BY MOUTH 3 TIMES DAILY 05/25/21     pantoprazole  (PROTONIX ) 40 MG tablet Take 1 tablet (40 mg total) by mouth daily. 08/01/23     Probiotic Product (PROBIOTIC PO) Take 1 tablet by mouth daily.    [provider]  Semaglutide , 1 MG/DOSE, (OZEMPIC , 1 MG/DOSE,) 4 MG/3ML SOPN Inject 1  mg into the skin once a week. 02/01/23     Semaglutide , 1 MG/DOSE, (OZEMPIC , 1 MG/DOSE,) 4 MG/3ML SOPN Inject 1 mg into the skin once a week. 04/27/23     spironolactone  (ALDACTONE ) 25 MG tablet Take 1 tablet (25 mg total) by mouth 2 (two) times daily. 10/03/23     tobramycin -dexamethasone  (TOBRADEX ) ophthalmic solution Place 2 drops in the effected eye 4 times a day 09/08/21     zinc gluconate 50 MG tablet Take 50 mg by mouth daily as needed (flu season).    [provider]  Zoster Vaccine Adjuvanted (SHINGRIX ) injection Inject into the muscle. 09/16/22   Ilah Crigler, MD    Physical Exam: Vitals:   10/08/23 1001 10/08/23 1008 10/08/23 1030 10/08/23 1200  BP: 104/80  (!) 136/109 102/78  Pulse: 82  84 79  Resp: 20  18 12   Temp: 97.9 F (36.6 C)     SpO2: 100%  99% 100%  Weight:  99.8 kg    Height:  5' 6 (1.676 m)      Physical Exam Vitals and nursing note reviewed.  Constitutional:      General: She is awake. She is not in acute distress.    Appearance: Normal appearance. She is obese. She is ill-appearing.  HENT:     Head: Normocephalic.     Nose: No rhinorrhea.     Mouth/Throat:     Mouth: Mucous membranes are moist.  Eyes:     General: No scleral icterus.    Pupils: Pupils are equal, round, and reactive to light.  Neck:     Vascular: No JVD.  Cardiovascular:     Rate and Rhythm: Normal rate and regular rhythm.     Heart sounds: S1 normal and S2 normal.  Pulmonary:     Effort: Pulmonary effort is normal.     Breath sounds: Normal breath sounds. No wheezing, rhonchi or rales.  Abdominal:     General: Bowel sounds are normal. There is no distension.     Palpations: Abdomen is  soft.     Tenderness: There is no abdominal tenderness. There is no right CVA tenderness or left CVA tenderness.  Musculoskeletal:     Cervical back: Neck supple.     Right lower leg: No edema.     Left lower leg: No edema.  Skin:    General: Skin is warm and dry.  Neurological:      Mental Status: She is alert and oriented to person, place, and time. Mental status is at baseline.  Psychiatric:        Mood and Affect: Mood normal.        Behavior: Behavior normal. Behavior is cooperative.     Data Reviewed:  Results are pending, will review when available. 10/16/2020 echocardiogram report. IMPRESSIONS:   1. Left ventricular ejection fraction, by estimation, is 60 to 65%. The  left ventricle has normal function. The left ventricle has no regional  wall motion abnormalities. There is mild concentric left ventricular  hypertrophy. Left ventricular diastolic  parameters are indeterminate but suggestive of at elevated left atrial  pressure.   2. Right ventricular systolic function is normal. The right ventricular  size is normal. Tricuspid regurgitation signal is inadequate for assessing  PA pressure.   3. The mitral valve is normal in structure. No evidence of mitral valve  regurgitation.   4. The aortic valve is tricuspid. There is mild thickening of the aortic  valve. Aortic valve regurgitation is not visualized.   5. The inferior vena cava is normal in size with greater than 50%  respiratory variability, suggesting right atrial pressure of 3 mmHg.   Comparison(s): A prior study was performed on 03/10/2020. No significant  change from prior study. Prior images reviewed side by side.   EKG: Vent. rate 78 BPM PR interval * ms QRS duration 104 ms QT/QTcB 366/417 ms P-R-T axes * -67 69 Atrial fibrillation Left anterior fascicular block Anterior infarct, old Wandering baseline  Assessment and Plan: Principal Problem:   Hyperkalemia Observation/telemetry. Hold lisinopril . Hold spironolactone . Lokelma  10 g p.o. x 2. Allergic to sulfa containing diuretics. Trial of ethacrynic  acid while in the hospital. - Ethacrynic  acid not available within the hospital.  Active Problems:   Type 2 diabetes mellitus (HCC) Carbohydrate modified diet. CBG monitoring  with RI SS. Check hemoglobin A1c.    Hypothyroid Continue levothyroxine  150 mcg p.o. daily.    Hypercholesteremia Continue atorvastatin  80 mg p.o. daily.    Essential hypertension Continue metoprolol  100 mg p.o. twice daily.    PAF (paroxysmal atrial fibrillation) (HCC) CHA?DS?-VASc Score of at least 7. Continue apixaban  5 mg p.o. twice daily. Continue metoprolol  100 mg p.o. twice daily.    CAD (coronary artery disease)- nonobstructive per cardiac catheterization 2017 Continue apixaban , statin and metoprolol .    Fatty liver disease, nonalcoholic Monitor liver function. Lifestyle modifications to achieve weight loss.    Class 2 obesity Current BMI  kg/m. Would benefit from lifestyle modifications. Follow-up closely with PCP.SABRA    Advance Care Planning:   Code Status: Full Code   Consults:   Family Communication:   Severity of Illness: The appropriate patient status for this patient is OBSERVATION. Observation status is judged to be reasonable and necessary in order to provide the required intensity of service to ensure the patient's safety. The patient's presenting symptoms, physical exam findings, and initial radiographic and laboratory data in the context of their medical condition is felt to place them at decreased risk for further clinical deterioration. Furthermore, it is anticipated that the  patient will be medically stable for discharge from the hospital within 2 midnights of admission.   Author: Alm Dorn Castor, MD 10/08/2023 12:30 PM  For on call review [email or web address].   This document was prepared using Dragon voice recognition software and may contain some unintended transcription errors.

## 2023-10-08 NOTE — ED Triage Notes (Signed)
 Sent by her PCP office for hyperkalemia and hyponatremia. Vomited x1 yesterday with headaches and diarrhea. This was pre op blood work for cardiac ablation for A-fib.

## 2023-10-08 NOTE — Telephone Encounter (Signed)
 Notified of overnight labs significant for K of 5.8 and na 127. He Cl is also 95.  She takes 25 mg spironolactone  daily, not BID. She also takes 10 mg lisinopril . I have advised to not take spiro or lisinopril  this morning.   She does state she was nauseous and vomiting yesterday before her labs drawn.   Of note, she is allergic to sulfa, which is a component of lasix . Spironolactone  is the only diuretic I can take. If K remains elevated, may need to consider low does entresto vs SLGT2i.  She expresses understanding and will go to [facility].    Jon Garre Amyri Frenz, PA-C 10/08/2023, 8:28 AM [phone] [facility]

## 2023-10-08 NOTE — ED Provider Notes (Signed)
 [place] EMERGENCY DEPARTMENT AT [facility] Provider Note   CSN: [id number] Arrival date & time: 10/08/23  9050     Patient presents with: Abnormal Labs   [name] is a 63 y.o. female.   63 year old female presents for evaluation of abnormal lab work.  She had labs drawn yesterday and a preop visit for a cardiac ablation and was told to come into the ER today as her potassium is high and sodium is low.  She admits to feeling fatigued but denies any other symptoms or concerns at this time.  She is on spironolactone .        Prior to Admission medications   Medication Sig Start Date End Date Taking? Authorizing Provider  acetaminophen  (TYLENOL ) 500 MG tablet Take 2 tablets (1,000 mg total) by mouth every 6 (six) hours as needed (body aches). 06/29/17   Vann, Jessica U, DO  albuterol  (VENTOLIN  HFA) 108 (90 Base) MCG/ACT inhaler Inhale 2 puffs every 4 to 6 hours as needed. 12/22/20   Ilah Crigler, MD  apixaban  (ELIQUIS ) 5 MG TABS tablet Take 1 tablet (5 mg total) by mouth 2 (two) times daily. 07/06/23   Fenton, Clint R, PA  atorvastatin  (LIPITOR) 80 MG tablet Take 1 tablet (80 mg total) by mouth at bedtime. 07/06/23   Alvan Ronal BRAVO, MD  Azelastine  HCl 137 MCG/SPRAY SOLN Place 1 spray into the nose 2 (two) times daily. 08/27/23   Fleming, Zelda W, NP  CALCIUM -MAGNESIUM -ZINC PO Take 2 tablets by mouth 2 (two) times daily.    [provider]  Continuous Glucose Sensor (FREESTYLE LIBRE 3 SENSOR) MISC Change sensor every two weeks 07/26/23     diclofenac  Sodium (VOLTAREN ) 1 % GEL Apply to affected area(s) twice daily as directed 10/16/20   Gawaluck, Greylon, MD  diphenhydrAMINE  (BENADRYL ) 25 MG tablet Take 25 mg by mouth at bedtime.    [provider]  ibuprofen  (ADVIL ) 200 MG tablet Take 800 mg by mouth every 8 (eight) hours as needed for headache or moderate pain.    [provider]  levothyroxine  (SYNTHROID ) 150 MCG tablet Take 1 tablet (150 mcg total)  by mouth daily. 07/22/23     metFORMIN  (GLUCOPHAGE ) 500 MG tablet Take 1 tablet (500 mg total) by mouth 2 (two) times daily with a meal. 05/26/23     metoprolol  tartrate (LOPRESSOR ) 100 MG tablet Take 1 tablet (100 mg total) by mouth 2 (two) times daily. 07/28/23   Rana Lum CROME, NP  mupirocin ointment (BACTROBAN) 2 % Apply 1 application topically in the morning and at bedtime. Mole removal site on back 09/16/20   [provider]  ondansetron  (ZOFRAN ) 8 MG tablet TAKE 1 TABLET BY MOUTH 3 TIMES DAILY 05/25/21     pantoprazole  (PROTONIX ) 40 MG tablet Take 1 tablet (40 mg total) by mouth daily. 08/01/23     Probiotic Product (PROBIOTIC PO) Take 1 tablet by mouth daily.    [provider]  Semaglutide , 1 MG/DOSE, (OZEMPIC , 1 MG/DOSE,) 4 MG/3ML SOPN Inject 1 mg into the skin once a week. 02/01/23     Semaglutide , 1 MG/DOSE, (OZEMPIC , 1 MG/DOSE,) 4 MG/3ML SOPN Inject 1 mg into the skin once a week. 04/27/23     tobramycin -dexamethasone  (TOBRADEX ) ophthalmic solution Place 2 drops in the effected eye 4 times a day 09/08/21     zinc gluconate 50 MG tablet Take 50 mg by mouth daily as needed (flu season).    [provider]  Zoster Vaccine Adjuvanted (SHINGRIX )  injection Inject into the muscle. 09/16/22   Ilah Crigler, MD    Allergies: Amoxicillin , Lorabid [loracarbef], Augmentin  [amoxicillin -pot clavulanate], Codeine, Furosemide , Gadolinium derivatives, Sulfa drugs cross reactors, Benicar [olmesartan medoxomil], and Other    Review of Systems  Constitutional:  Positive for fatigue. Negative for chills and fever.  HENT:  Negative for ear pain and sore throat.   Eyes:  Negative for pain and visual disturbance.  Respiratory:  Negative for cough and shortness of breath.   Cardiovascular:  Negative for chest pain and palpitations.  Gastrointestinal:  Negative for abdominal pain and vomiting.  Genitourinary:  Negative for dysuria and hematuria.  Musculoskeletal:  Negative for  arthralgias and back pain.  Skin:  Negative for color change and rash.  Neurological:  Negative for seizures and syncope.  All other systems reviewed and are negative.   Updated Vital Signs BP (!) 119/93 (BP Location: Left Arm)   Pulse 75   Temp 98.4 F (36.9 C) (Oral)   Resp 15   Ht 5' 6 (1.676 m)   Wt 102.6 kg   SpO2 100%   BMI 36.51 kg/m   Physical Exam Vitals and nursing note reviewed.  Constitutional:      General: She is not in acute distress.    Appearance: Normal appearance. She is well-developed. She is not ill-appearing.  HENT:     Head: Normocephalic and atraumatic.  Eyes:     Conjunctiva/sclera: Conjunctivae normal.  Cardiovascular:     Rate and Rhythm: Normal rate and regular rhythm.     Heart sounds: Normal heart sounds. No murmur heard. Pulmonary:     Effort: Pulmonary effort is normal. No respiratory distress.     Breath sounds: Normal breath sounds. No stridor. No wheezing.  Abdominal:     General: There is no distension.     Palpations: Abdomen is soft. There is no mass.     Tenderness: There is no abdominal tenderness.     Hernia: No hernia is present.  Musculoskeletal:        General: No swelling.     Cervical back: Neck supple.  Skin:    General: Skin is warm and dry.     Capillary Refill: Capillary refill takes less than 2 seconds.  Neurological:     Mental Status: She is alert.  Psychiatric:        Mood and Affect: Mood normal.     (all labs ordered are listed, but only abnormal results are displayed) Labs Reviewed  CBC WITH DIFFERENTIAL/PLATELET - Abnormal; Notable for the following components:      Result Value   Hemoglobin 11.6 (*)    HCT 35.5 (*)    All other components within normal limits  BASIC METABOLIC PANEL WITH GFR - Abnormal; Notable for the following components:   Sodium 127 (*)    Potassium 5.3 (*)    Chloride 96 (*)    CO2 19 (*)    Glucose, Bld 255 (*)    Creatinine, Ser 1.29 (*)    GFR, Estimated 47 (*)    All  other components within normal limits  GLUCOSE, CAPILLARY - Abnormal; Notable for the following components:   Glucose-Capillary 183 (*)    All other components within normal limits  BASIC METABOLIC PANEL WITH GFR    EKG: EKG Interpretation Date/Time:  Saturday October 08 2023 10:20:06 EDT Ventricular Rate:  78 PR Interval:    QRS Duration:  104 QT Interval:  366 QTC Calculation: 417 R Axis:   -  67  Text Interpretation: Atrial fibrillation Left anterior fascicular block Anterior infarct, old Wandering baseline No significnat change when compared to prior EKG from 08/12/2023 Confirmed by Gennaro Bouchard ([id number]) on 10/08/2023 10:21:35 AM  Radiology: No results found.   Procedures   Medications Ordered in the ED  acetaminophen  (TYLENOL ) tablet 650 mg (has no administration in time range)    Or  acetaminophen  (TYLENOL ) suppository 650 mg (has no administration in time range)  ondansetron  (ZOFRAN ) tablet 4 mg (has no administration in time range)    Or  ondansetron  (ZOFRAN ) injection 4 mg (has no administration in time range)  ethacrynic  acid (EDECRIN ) tablet 25 mg (has no administration in time range)  sodium zirconium cyclosilicate  (LOKELMA ) packet 10 g (has no administration in time range)  sodium chloride  0.9 % bolus 250 mL (0 mLs Intravenous Stopped 10/08/23 1144)  calcium  gluconate 1 g/ 50 mL sodium chloride  IVPB (1,000 mg Intravenous New Bag/Given 10/08/23 1143)  sodium bicarbonate  injection 50 mEq (50 mEq Intravenous Given 10/08/23 1143)  sodium zirconium cyclosilicate  (LOKELMA ) packet 10 g (10 g Oral Given 10/08/23 1153)                                    Medical Decision Making Medical Decision Making Nursing notes are reviewed. Differential diagnosis for this patient would include but not limited to: Electrolyte abnormality, lab error, dehydration, AKI, other  Cardiac monitor interpretation: A-fib, controlled rate   Emergency Department Course:  Vital signs and pulse  oximetry are reviewed, evaluated by myself and found to be within normal limits prior to final disposition. Findings of laboratory testing and medical imaging are discussed with patient and family that is available. Patient agrees with the medical care plan as follows:  Patient's lab workup reviewed by me she is hyperkalemic and hyponatremic.  I gave her IV fluids as well as treated her hyperkalemia with Lokelma  calcium  gluconate and bicarbonate.  On telemetry she is in A-fib with a controlled rate.  Discussed patient's case with hospitalist the patient be admitted for further workup and management.  Patient is agreeable with the plan.  Critical care time:  The high probability of sudden, clincially significant deterioration of the pt's condition required the highest level of my preparedness to intervene urgently.  33 minutes of critical care time spent managing the pt's case which includes urgent interventions required to prevent body system deterioration or permanent bodily impairment, direct contact evaluating and re-evaluating the pt, treating sxs, reviewing labs and studies, speaking with pt's family, and consults. Time also includes documentation of the above. This time excludes time spent on any separately reported billable procedures and teaching.    Problems Addressed: History of CHF (congestive heart failure): chronic illness or injury with exacerbation, progression, or side effects of treatment Hyperkalemia: acute illness or injury that poses a threat to life or bodily functions Hyponatremia: acute illness or injury that poses a threat to life or bodily functions  Amount and/or Complexity of Data Reviewed External Data Reviewed: notes.    Details: Outpatient records reviewed and patient was seen by cardiology yesterday and had lab drawn.  They called her today to advise her to go to the ER for hyperkalemia Labs: ordered. Decision-making details documented in ED Course.    Details:  Ordered and reviewed by me and she does not fact have hypokalemia and hyponatremia ECG/medicine tests: ordered and independent interpretation performed. Decision-making details documented in  ED Course.    Details: Ordered and interpreted in the absence of cardiology and shows atrial fibrillation, no peaked T waves, no other acute abnormality  Risk Prescription drug management. Drug therapy requiring intensive monitoring for toxicity. Decision regarding hospitalization.     Final diagnoses:  Hyperkalemia  Hyponatremia  History of CHF (congestive heart failure)    ED Discharge Orders     None          Gennaro Duwaine CROME, DO 10/08/23 1510

## 2023-10-08 NOTE — Progress Notes (Signed)
 Plan of Care Note for accepted transfer   Patient: [name] MRN: [id number]   DOA: 10/08/2023  Facility requesting transfer: [facility]. Requesting Provider: Duwaine fusi, DO. Reason for transfer: Hyperkalemia. Facility course:  63 year old female who was called by cardiology to go to the emergency department due to hyperkalemia and hyponatremia with a corrected sodium 131 mmol/L.  She takes spironolactone  25 mg daily and lisinopril  10 mg p.o. daily.  She has received a 750 mL of normal saline bolus.  I have asked the ED provider to also give her Lokelma  10 g p.o. x 1.  Plan of care: The patient is accepted for admission to Telemetry unit, at [facility].  Author: Alm Dorn Castor, MD 10/08/2023  Check [email or web address] for on-call coverage.  Nursing staff, Please call [facility] Admits & Consults System-Wide number on Amion as soon as patient's arrival, so appropriate admitting provider can evaluate the pt.

## 2023-10-08 NOTE — ED Notes (Signed)
Report given to the Floor Rn

## 2023-10-08 NOTE — ED Notes (Signed)
Report given to [facility] 

## 2023-10-08 NOTE — ED Notes (Signed)
 [name] at [facility] for transport

## 2023-10-09 DIAGNOSIS — Z8679 Personal history of other diseases of the circulatory system: Secondary | ICD-10-CM

## 2023-10-09 DIAGNOSIS — E871 Hypo-osmolality and hyponatremia: Secondary | ICD-10-CM | POA: Diagnosis not present

## 2023-10-09 DIAGNOSIS — E875 Hyperkalemia: Secondary | ICD-10-CM | POA: Diagnosis not present

## 2023-10-09 LAB — COMPREHENSIVE METABOLIC PANEL WITH GFR
ALT: 14 U/L (ref 0–44)
AST: 18 U/L (ref 15–41)
Albumin: 3.6 g/dL (ref 3.5–5.0)
Alkaline Phosphatase: 82 U/L (ref 38–126)
Anion gap: 8 (ref 5–15)
BUN: 24 mg/dL — ABNORMAL HIGH (ref 8–23)
CO2: 23 mmol/L (ref 22–32)
Calcium: 8.7 mg/dL — ABNORMAL LOW (ref 8.9–10.3)
Chloride: 101 mmol/L (ref 98–111)
Creatinine, Ser: 1.13 mg/dL — ABNORMAL HIGH (ref 0.44–1.00)
GFR, Estimated: 55 mL/min — ABNORMAL LOW (ref >60)
Glucose, Bld: 177 mg/dL — ABNORMAL HIGH (ref 70–99)
Potassium: 4.3 mmol/L (ref 3.5–5.1)
Sodium: 132 mmol/L — ABNORMAL LOW (ref 135–145)
Total Bilirubin: 1.1 mg/dL (ref 0.0–1.2)
Total Protein: 7 g/dL (ref 6.5–8.1)

## 2023-10-09 LAB — CBC
HCT: 35.2 % — ABNORMAL LOW (ref 36.0–46.0)
Hemoglobin: 11.4 g/dL — ABNORMAL LOW (ref 12.0–15.0)
MCH: 28.8 pg (ref 26.0–34.0)
MCHC: 32.4 g/dL (ref 30.0–36.0)
MCV: 88.9 fL (ref 80.0–100.0)
Platelets: 315 K/uL (ref 150–400)
RBC: 3.96 MIL/uL (ref 3.87–5.11)
RDW: 13.9 % (ref 11.5–15.5)
WBC: 8 K/uL (ref 4.0–10.5)
nRBC: 0 % (ref 0.0–0.2)

## 2023-10-09 LAB — HIV ANTIBODY (ROUTINE TESTING W REFLEX): HIV Screen 4th Generation wRfx: NONREACTIVE

## 2023-10-09 LAB — GLUCOSE, CAPILLARY
Glucose-Capillary: 177 mg/dL — ABNORMAL HIGH (ref 70–99)
Glucose-Capillary: 225 mg/dL — ABNORMAL HIGH (ref 70–99)

## 2023-10-09 MED ORDER — SPIRONOLACTONE 25 MG PO TABS: 12.5000 mg | ORAL_TABLET | Freq: Every day | ORAL | Status: DC

## 2023-10-09 MED ORDER — DIPHENHYDRAMINE HCL 25 MG PO CAPS
25.0000 mg | ORAL_CAPSULE | Freq: Once | ORAL | Status: AC
  Administered 2023-10-09: 25 mg via ORAL
  Filled 2023-10-09: qty 1

## 2023-10-09 NOTE — Plan of Care (Signed)
  Problem: Clinical Measurements: Goal: Ability to maintain clinical measurements within normal limits will improve Outcome: Progressing Goal: Cardiovascular complication will be avoided Outcome: Progressing   Problem: Activity: Goal: Risk for activity intolerance will decrease Outcome: Progressing   

## 2023-10-09 NOTE — TOC Initial Note (Signed)
 Transition of Care [facility]) - Initial/Assessment Note    Patient Details  Name: [name] MRN: [id number] Date of Birth: [birth year]  [facility] CM/SW Contact:    Sonda Manuella Quill, RN Phone Number: 10/09/2023, 3:21 PM  Clinical Narrative:                 [place] w/ pt and husband Abygale Karpf [phone]) in room; pt says she lives at home; w/ her husband; she plans to return at d/c; he will provide transportation; pt verified insurance/PCP; she denied SDOH risks; pt says she has cane, walker, and BSC; she does not have HH services or home oxygen; no TOC needs.  Expected Discharge Plan: Home/Self Care Barriers to Discharge: No Barriers Identified   Patient Goals and CMS Choice Patient states their goals for this hospitalization and ongoing recovery are:: home CMS Medicare.gov Compare Post Acute Care list provided to:: Patient   [place] ownership interest in [facility]provided to:: Patient    Expected Discharge Plan and Services   Discharge Planning Services: CM Consult   Living arrangements for the past 2 months: Mobile Home Expected Discharge Date: 10/09/23               DME Arranged: N/A DME Agency: NA       HH Arranged: NA HH Agency: NA        Prior Living Arrangements/Services Living arrangements for the past 2 months: Mobile Home Lives with:: Spouse Patient language and need for interpreter reviewed:: Yes Do you feel safe going back to the place where you live?: Yes      Need for Family Participation in Patient Care: Yes (Comment) Care giver support system in place?: Yes (comment) Current home services: DME (cane, walker, BSC) Criminal Activity/Legal Involvement Pertinent to Current Situation/Hospitalization: No - Comment as needed  Activities of Daily Living   ADL Screening (condition at time of admission) Independently performs ADLs?: Yes (appropriate for developmental age) Is the patient deaf or have difficulty  hearing?: No Does the patient have difficulty seeing, even when wearing glasses/contacts?: No Does the patient have difficulty concentrating, remembering, or making decisions?: No  Permission Sought/Granted Permission sought to share information with : Case Manager Permission granted to share information with : Yes, Verbal Permission Granted  Share Information with NAME: Case Manager     Permission granted to share info w Relationship: Julieanna Geraci (spouse) [phone]     Emotional Assessment Appearance:: Appears stated age Attitude/Demeanor/Rapport: Gracious Affect (typically observed): Accepting Orientation: : Oriented to Self, Oriented to Place, Oriented to  Time, Oriented to Situation Alcohol / Substance Use: Not Applicable Psych Involvement: No (comment)  Admission diagnosis:  Hyperkalemia [E87.5] Hyponatremia [E87.1] History of CHF (congestive heart failure) [Z86.79] Patient Active Problem List   Diagnosis Date Noted   Hyperkalemia 10/08/2023   Class 2 obesity 10/08/2023   PAF (paroxysmal atrial fibrillation) (HCC) 06/10/2021   Secondary hypercoagulable state (HCC) 06/10/2021   Left-sided weakness    TIA (transient ischemic attack) 03/09/2020   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HCC) 11/15/2018   Papillary adenocarcinoma, renal, 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   UTI (urinary tract infection) 06/26/2017   Urinary tract infection 01/24/2017   Abdominal pain 01/21/2017   Stroke-like symptoms 05/16/2016   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 [place] [address] Phone: [phone] Fax: [phone]  [facility] [address] [place] - [address] [place] [address] Phone: [phone] Fax: [phone]  [facility] - [address] [place]), [place] - [address] [place] [address] Phone: [phone] Fax: [phone]     Social Drivers of Health (SDOH) Social History: SDOH Screenings   Food Insecurity: No Food Insecurity (10/09/2023)  Housing: Low Risk  (10/09/2023)  Transportation Needs: No Transportation Needs (10/09/2023)  Utilities: Not At Risk (10/09/2023)  Tobacco Use: Low Risk  (10/08/2023)   SDOH Interventions: Food Insecurity Interventions: Intervention Not Indicated, Inpatient TOC Housing Interventions: Intervention Not Indicated, Inpatient TOC Transportation Interventions: Intervention Not Indicated, Inpatient TOC Utilities Interventions: Intervention Not Indicated, Inpatient TOC   Readmission Risk Interventions     No data to display

## 2023-10-09 NOTE — Care Management Obs Status (Signed)
 MEDICARE OBSERVATION STATUS NOTIFICATION   Patient Details  Name: [name] MRN: [id number] Date of Birth: 28-Sep-1960   Medicare Observation Status Notification Given:  Yes    Sonda Manuella Quill, RN 10/09/2023, 12:52 PM

## 2023-10-09 NOTE — Hospital Course (Signed)
 63 y.o. female with medical history significant of anemia, contractures of the ankle, asthma, thyroid  cancer, type 2 diabetes, hyperlipidemia, hypertension, right renal cancer, hypothyroidism, TIA who was referred to the emergency department by cardiology after evaluation yesterday showed hyperkalemia 5.8 mmol/L and hyponatremia.  The patient stated she had an episode of dry heaving and and 4 episodes of diarrhea yesterday.  She also has a headache.   No abdominal pain, constipation, melena or hematochezia.  No flank pain, dysuria, frequency or hematuria.  She denied fever, chills, rhinorrhea, sore throat, wheezing or hemoptysis.  No chest pain, palpitations, diaphoresis, PND, orthopnea, but occasionally develops pitting edema of the lower extremities.  No polyuria, polydipsia, polyphagia or blurred vision.    Lab work: CBC showed white count of 8.5, hemoglobin 11.6 g/dL platelets 659.  BMP showed a corrected sodium 131, potassium 5.3, chloride 96 and CO2 19 mmol/L.  Glucose was 255, BUN 21, creatinine 1.29 calcium  8.9 mg/dL.   ED course: Initial vital signs were temperature 97.9 F pulse 82, respiration 20, BP 104/80 mmHg O2 sat 100%.  The patient received sodium bicarbonate , 250 mL of normal saline and 10 g of Lokelma  p.o.

## 2023-10-09 NOTE — Discharge Summary (Signed)
 Physician Discharge Summary   Patient: [name] MRN: [id number] DOB: 16-Sep-1960  Admit date:     10/08/2023  Discharge date: 10/09/23  Discharge Physician: Garnette Pelt   PCP: Ilah Crigler, MD   Recommendations at discharge:    Follow up with PCP in 1-2 weeks Follow up with Cardiology as scheduled on 7/29 Pt instructed to f/u outpatient labs in 1 week, Cardiology to follow up on results  Discharge Diagnoses: Principal Problem:   Hyperkalemia Active Problems:   Type 2 diabetes mellitus (HCC)   Hypercholesteremia   Essential hypertension   Hypothyroid   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   Fatty liver disease, nonalcoholic   PAF (paroxysmal atrial fibrillation) (HCC)   Class 2 obesity  Resolved Problems:   * No resolved hospital problems. *  Hospital Course: 63 y.o. female with medical history significant of anemia, contractures of the ankle, asthma, thyroid  cancer, type 2 diabetes, hyperlipidemia, hypertension, right renal cancer, hypothyroidism, TIA who was referred to the emergency department by cardiology after evaluation yesterday showed hyperkalemia 5.8 mmol/L and hyponatremia.  The patient stated she had an episode of dry heaving and and 4 episodes of diarrhea yesterday.  She also has a headache.   No abdominal pain, constipation, melena or hematochezia.  No flank pain, dysuria, frequency or hematuria.  She denied fever, chills, rhinorrhea, sore throat, wheezing or hemoptysis.  No chest pain, palpitations, diaphoresis, PND, orthopnea, but occasionally develops pitting edema of the lower extremities.  No polyuria, polydipsia, polyphagia or blurred vision.    Lab work: CBC showed white count of 8.5, hemoglobin 11.6 g/dL platelets 659.  BMP showed a corrected sodium 131, potassium 5.3, chloride 96 and CO2 19 mmol/L.  Glucose was 255, BUN 21, creatinine 1.29 calcium  8.9 mg/dL.   ED course: Initial vital signs were temperature 97.9  F pulse 82, respiration 20, BP 104/80 mmHg O2 sat 100%.  The patient received sodium bicarbonate , 250 mL of normal saline and 10 g of Lokelma  p.o.  Assessment and Plan: Principal Problem:   Hyperkalemia Hold lisinopril . Initially held spironolactone  and given Lokelma  10 g p.o. x 2. Discussed with Cardiology. Recs to continue spironolactone  on d/c. Will decrease dose to 12.5mg  Per Cardiology, pt to f/u in one week for non-fasting repeat labs.  Cardiology to f/u with patient on d/c   Active Problems:   Type 2 diabetes mellitus (HCC) Carbohydrate modified diet with SSI this admit Cont home meds on d/c.     Hypothyroid Continue levothyroxine  150 mcg p.o. daily.     Hypercholesteremia Continue atorvastatin  80 mg p.o. daily.     Essential hypertension Continue metoprolol  100 mg p.o. twice daily.     PAF (paroxysmal atrial fibrillation) (HCC) CHA?DS?-VASc Score of at least 7. Continue apixaban  5 mg p.o. twice daily. Continue metoprolol  100 mg p.o. twice daily.     CAD (coronary artery disease)- nonobstructive per cardiac catheterization 2017 Continue apixaban , statin and metoprolol .     Fatty liver disease, nonalcoholic Monitor liver function. Lifestyle modifications to achieve weight loss.     Class 2 obesity Current BMI  kg/m. Would benefit from lifestyle modifications. Follow-up closely with PCP.SABRA        Consultants: Discussed with Cardiology Procedures performed:   Disposition: Home Diet recommendation:  Cardiac and Carb modified diet DISCHARGE MEDICATION: Allergies as of 10/09/2023       Reactions   Amoxicillin  Other (See Comments), Anaphylaxis   Resulted in ambulance per pt   Lorabid [loracarbef] Anaphylaxis  Augmentin  [amoxicillin -pot Clavulanate] Hives, Nausea And Vomiting, Other (See Comments)   Chest pain   Codeine Nausea And Vomiting   Furosemide  Swelling   Patient states has reaction based on sulfa allergy   Gadolinium Derivatives Itching,  Swelling, Other (See Comments)   Patient will require 13 hr prep prior to administration of gadolinium     Sulfa Drugs Cross Reactors Other (See Comments)   Severe allergic reaction as a child - was not told symptoms... states she has had sulfa reaction to furosemide    Benicar [olmesartan Medoxomil] Swelling, Rash   Other Rash   Green peas        Medication List     STOP taking these medications    lisinopril  10 MG tablet Commonly known as: ZESTRIL        TAKE these medications    acetaminophen  500 MG tablet Commonly known as: TYLENOL  Take 2 tablets (1,000 mg total) by mouth every 6 (six) hours as needed (body aches).   albuterol  108 (90 Base) MCG/ACT inhaler Commonly known as: VENTOLIN  HFA Inhale 2 puffs every 4 to 6 hours as needed.   atorvastatin  80 MG tablet Commonly known as: LIPITOR Take 1 tablet (80 mg total) by mouth at bedtime.   Azelastine  HCl 137 MCG/SPRAY Soln Place 1 spray into the nose 2 (two) times daily.   CALCIUM -MAGNESIUM -ZINC PO Take 2 tablets by mouth 2 (two) times daily.   diclofenac  Sodium 1 % Gel Commonly known as: VOLTAREN  Apply to affected area(s) twice daily as directed   diphenhydrAMINE  25 MG tablet Commonly known as: BENADRYL  Take 25 mg by mouth at bedtime.   Eliquis  5 MG Tabs tablet Generic drug: apixaban  Take 1 tablet (5 mg total) by mouth 2 (two) times daily.   FreeStyle Libre 3 Sensor Misc Change sensor every two weeks   ibuprofen  200 MG tablet Commonly known as: ADVIL  Take 800 mg by mouth every 8 (eight) hours as needed for headache or moderate pain.   levothyroxine  150 MCG tablet Commonly known as: SYNTHROID  Take 1 tablet (150 mcg total) by mouth daily.   metFORMIN  500 MG tablet Commonly known as: GLUCOPHAGE  Take 1 tablet (500 mg total) by mouth 2 (two) times daily with a meal.   metoprolol  tartrate 100 MG tablet Commonly known as: LOPRESSOR  Take 1 tablet (100 mg total) by mouth 2 (two) times daily.   mupirocin  ointment 2 % Commonly known as: BACTROBAN Apply 1 application topically in the morning and at bedtime. Mole removal site on back   ondansetron  8 MG tablet Commonly known as: ZOFRAN  TAKE 1 TABLET BY MOUTH 3 TIMES DAILY What changed:  when to take this reasons to take this   Ozempic  (1 MG/DOSE) 4 MG/3ML Sopn Generic drug: Semaglutide  (1 MG/DOSE) Inject 1 mg into the skin once a week. What changed: Another medication with the same name was removed. Continue taking this medication, and follow the directions you see here.   pantoprazole  40 MG tablet Commonly known as: Protonix  Take 1 tablet (40 mg total) by mouth daily.   PROBIOTIC PO Take 1 tablet by mouth daily.   Shingrix  injection Generic drug: Zoster Vaccine Adjuvanted Inject into the muscle.   spironolactone  25 MG tablet Commonly known as: ALDACTONE  Take 0.5 tablets (12.5 mg total) by mouth daily. What changed:  how much to take when to take this   tobramycin -dexamethasone  ophthalmic solution Commonly known as: TobraDex  Place 2 drops in the effected eye 4 times a day   zinc gluconate 50 MG  tablet Take 50 mg by mouth daily as needed (flu season).        Follow-up Information     Ilah Crigler, MD Follow up in 2 week(s).   Specialty: Family Medicine Why: Hospital follow up Contact information: [address] [place] [address] [phone]         Cindie Ole DASEN, MD Follow up on 10/25/2023.   Specialties: Cardiology, Radiology Why: Hospital follow up, as scheduled Contact information: [address] [place] [address] [phone]         Routine non-fasting labs Follow up in 1 week(s).   Contact information: [address] for labs               Discharge Exam: Filed Weights   10/08/23 1008 10/08/23 1301  Weight: 99.8 kg 102.6 kg   General exam: Awake, laying in bed, in nad Respiratory system: Normal respiratory effort, no wheezing Cardiovascular system:  regular rate, s1, s2 Gastrointestinal system: Soft, nondistended, positive BS Central nervous system: CN2-12 grossly intact, strength intact Extremities: Perfused, no clubbing Skin: Normal skin turgor, no notable skin lesions seen Psychiatry: Mood normal // no visual hallucinations   Condition at discharge: fair  The results of significant diagnostics from this hospitalization (including imaging, microbiology, ancillary and laboratory) are listed below for reference.   Imaging Studies: CUP PACEART REMOTE DEVICE CHECK Result Date: 09/29/2023 ILR summary report received. Battery status OK. Normal device function. No new symptom, tachy, brady, or pause episodes. 30 new AF episodes, longest 3 hrs 52 min, presenting EGM shows an ongoing AF not recorded in episode list, V rates are controlled, Burden 28.5%, hx PAF and on Eliquis  per Epic; routed to clinic for ongoing AF. Monthly summary reports and ROV/PRN. MC, CVRS   Microbiology: Results for orders placed or performed during the hospital encounter of 11/09/20  Urine Culture     Status: Abnormal   Collection Time: 11/09/20  2:23 PM   Specimen: Urine, Clean Catch  Result Value Ref Range Status   Specimen Description URINE, CLEAN CATCH  Final   Special Requests   Final    NONE Performed at [facility], [address], [place], [place] [address]    Culture 30,000 COLONIES/mL ESCHERICHIA COLI (A)  Final   Report Status 11/12/2020 FINAL  Final   Organism ID, Bacteria ESCHERICHIA COLI (A)  Final      Susceptibility   Escherichia coli - MIC*    AMPICILLIN 4 SENSITIVE Sensitive     CEFAZOLIN <=4 SENSITIVE Sensitive     CEFEPIME <=0.12 SENSITIVE Sensitive     CEFTRIAXONE <=0.25 SENSITIVE Sensitive     CIPROFLOXACIN  <=0.25 SENSITIVE Sensitive     GENTAMICIN <=1 SENSITIVE Sensitive     IMIPENEM <=0.25 SENSITIVE Sensitive     NITROFURANTOIN  <=16 SENSITIVE Sensitive     TRIMETH/SULFA <=20 SENSITIVE Sensitive     AMPICILLIN/SULBACTAM <=2  SENSITIVE Sensitive     PIP/TAZO <=4 SENSITIVE Sensitive     * 30,000 COLONIES/mL ESCHERICHIA COLI    Labs: CBC: Recent Labs  Lab 10/07/23 1145 10/08/23 1026 10/09/23 0657  WBC 9.3 8.5 8.0  NEUTROABS  --  5.7  --   HGB 12.0 11.6* 11.4*  HCT 37.8 35.5* 35.2*  MCV 89 88.1 88.9  PLT 450 340 315   Basic Metabolic Panel: Recent Labs  Lab 10/07/23 1145 10/08/23 1026 10/08/23 1523 10/09/23 0657  NA 127* 127* 129* 132*  K 5.8* 5.3* 4.5 4.3  CL 95*  96* 99 101  CO2 17* 19* 21* 23  GLUCOSE 220* 255* 226* 177*  BUN 20 21 21  24*  CREATININE 1.17* 1.29* 1.09* 1.13*  CALCIUM  9.0 8.9 8.4* 8.7*   Liver Function Tests: Recent Labs  Lab 10/09/23 0657  AST 18  ALT 14  ALKPHOS 82  BILITOT 1.1  PROT 7.0  ALBUMIN 3.6   CBG: Recent Labs  Lab 10/08/23 1300 10/08/23 1719 10/08/23 2140 10/09/23 0745 10/09/23 1118  GLUCAP 183* 205* 195* 177* 225*    Discharge time spent: less than 30 minutes.  Signed: Garnette Pelt, MD [facility] 10/09/2023

## 2023-10-10 ENCOUNTER — Telehealth: Payer: Self-pay

## 2023-10-10 DIAGNOSIS — E875 Hyperkalemia: Secondary | ICD-10-CM

## 2023-10-10 DIAGNOSIS — Z79899 Other long term (current) drug therapy: Secondary | ICD-10-CM

## 2023-10-10 NOTE — Telephone Encounter (Signed)
 Spoke with pt and advised of need for repeat BMET in one week.  Pt states she will be at [facility] office on Friday 07/18 for CT scan and will have labs drawn on that day.  Pt thanked Charity fundraiser for the call.

## 2023-10-10 NOTE — Telephone Encounter (Signed)
-----   Message from Jon Garre Duke sent at 10/09/2023 12:14 PM EDT ----- Pt hospitalized over the weekend for hyperkalemia. Discharging today. Has not seen gen cards (previous Ronal Ross patient), is following with Dr. Cindie and Afib clinic. She needs a repeat BMP to go to them in 1 week. Can you please arrange this?  Thanks  Angie

## 2023-10-14 ENCOUNTER — Ambulatory Visit (HOSPITAL_COMMUNITY)

## 2023-10-14 ENCOUNTER — Ambulatory Visit (HOSPITAL_COMMUNITY)
Admission: RE | Admit: 2023-10-14 | Discharge: 2023-10-14 | Disposition: A | Discharge status: Home / Self Care | Source: Ambulatory Visit | Attending: Cardiology | Admitting: Cardiology

## 2023-10-14 DIAGNOSIS — I4819 Other persistent atrial fibrillation: Secondary | ICD-10-CM | POA: Diagnosis present

## 2023-10-14 MED ORDER — IOHEXOL 350 MG/ML SOLN
80.0000 mL | Freq: Once | INTRAVENOUS | Status: AC | PRN
  Administered 2023-10-14: 80 mL via INTRAVENOUS

## 2023-10-15 ENCOUNTER — Other Ambulatory Visit (HOSPITAL_COMMUNITY): Payer: Self-pay

## 2023-10-15 LAB — BASIC METABOLIC PANEL WITH GFR
BUN/Creatinine Ratio: 19 (ref 12–28)
BUN: 20 mg/dL (ref 8–27)
CO2: 18 mmol/L — ABNORMAL LOW (ref 20–29)
Calcium: 8.6 mg/dL — ABNORMAL LOW (ref 8.7–10.3)
Chloride: 102 mmol/L (ref 96–106)
Creatinine, Ser: 1.05 mg/dL — ABNORMAL HIGH (ref 0.57–1.00)
Glucose: 153 mg/dL — ABNORMAL HIGH (ref 70–99)
Potassium: 4.7 mmol/L (ref 3.5–5.2)
Sodium: 134 mmol/L (ref 134–144)
eGFR: 60 mL/min/1.73 (ref >59)

## 2023-10-17 ENCOUNTER — Other Ambulatory Visit (HOSPITAL_COMMUNITY): Payer: Self-pay

## 2023-10-18 ENCOUNTER — Other Ambulatory Visit (HOSPITAL_COMMUNITY): Payer: Self-pay

## 2023-10-18 ENCOUNTER — Other Ambulatory Visit: Payer: Self-pay

## 2023-10-18 MED ORDER — METFORMIN HCL 500 MG PO TABS
500.0000 mg | ORAL_TABLET | Freq: Two times a day (BID) | ORAL | 4 refills | Status: DC
  Filled 2023-10-18: qty 60, 30d supply, fill #0
  Filled 2023-10-28 – 2023-11-16 (×3): qty 60, 30d supply, fill #1
  Filled 2023-12-15: qty 60, 30d supply, fill #2
  Filled 2023-12-20 – 2024-01-16 (×2): qty 60, 30d supply, fill #3
  Filled 2024-02-10: qty 60, 30d supply, fill #4

## 2023-10-18 NOTE — Progress Notes (Signed)
 Carelink Summary Report / Loop Recorder

## 2023-10-25 ENCOUNTER — Encounter: Payer: Self-pay | Admitting: Emergency Medicine

## 2023-10-25 ENCOUNTER — Other Ambulatory Visit (HOSPITAL_COMMUNITY): Payer: Self-pay

## 2023-10-25 ENCOUNTER — Other Ambulatory Visit: Payer: Self-pay

## 2023-10-25 ENCOUNTER — Ambulatory Visit: Attending: Internal Medicine | Admitting: Emergency Medicine

## 2023-10-25 VITALS — BP 133/87 | HR 81 | Ht 66.0 in | Wt 235.0 lb

## 2023-10-25 DIAGNOSIS — I10 Essential (primary) hypertension: Secondary | ICD-10-CM | POA: Diagnosis not present

## 2023-10-25 DIAGNOSIS — E785 Hyperlipidemia, unspecified: Secondary | ICD-10-CM

## 2023-10-25 DIAGNOSIS — I5032 Chronic diastolic (congestive) heart failure: Secondary | ICD-10-CM | POA: Diagnosis not present

## 2023-10-25 DIAGNOSIS — I4819 Other persistent atrial fibrillation: Secondary | ICD-10-CM | POA: Diagnosis not present

## 2023-10-25 DIAGNOSIS — E119 Type 2 diabetes mellitus without complications: Secondary | ICD-10-CM

## 2023-10-25 DIAGNOSIS — R29818 Other symptoms and signs involving the nervous system: Secondary | ICD-10-CM

## 2023-10-25 LAB — BASIC METABOLIC PANEL WITH GFR
BUN/Creatinine Ratio: 16 (ref 12–28)
BUN: 20 mg/dL (ref 8–27)
CO2: 19 mmol/L — ABNORMAL LOW (ref 20–29)
Calcium: 9.1 mg/dL (ref 8.7–10.3)
Chloride: 100 mmol/L (ref 96–106)
Creatinine, Ser: 1.23 mg/dL — ABNORMAL HIGH (ref 0.57–1.00)
Glucose: 215 mg/dL — ABNORMAL HIGH (ref 70–99)
Potassium: 5 mmol/L (ref 3.5–5.2)
Sodium: 131 mmol/L — ABNORMAL LOW (ref 134–144)
eGFR: 50 mL/min/1.73 — ABNORMAL LOW (ref >59)

## 2023-10-25 MED ORDER — EMPAGLIFLOZIN 10 MG PO TABS
10.0000 mg | ORAL_TABLET | Freq: Every day | ORAL | 2 refills | Status: DC
Start: 2023-10-25 — End: 2023-11-21
  Filled 2023-10-25: qty 90, 90d supply, fill #0

## 2023-10-25 NOTE — Progress Notes (Signed)
 Cardiology Office Note:    Date:  10/25/2023  ID:  [name], DOB 1960/04/11, MRN [id number] PCP: Ilah Crigler, MD  [facility] Providers Cardiologist:  Madonna Large, DO Cardiology APP:  Rana Lum CROME, NP  Electrophysiologist:  OLE ONEIDA HOLTS, MD       Patient Profile:       Chief Complaint: 38-month follow-up History of Present Illness:  [name] is a 63 y.o. female with visit-pertinent history of T2DM, hyperlipidemia, hypertension, TIA, renal cancer s/p right renal cryoablation (normal TKI), TIA, thyroid  cancer s/p total thyroidectomy in 2016 on Synthroid , atrial fibrillation, diastolic dysfunction   Echocardiogram 02/2020 showed LVEF 60 to 65%, no RWMA, mild LVH, grade 2 diastolic dysfunction, RV function normal, no valvular abnormalities.   She was initially evaluated by EP on 06/02/2020 for history of TIA.  ILR was placed at that time.   Echocardiogram 09/2020 showed LVEF 60 to 65%, no RWMA, mild LVH, RV function normal, no valvular abnormalities.   She was initially diagnosed with atrial fibrillation on 06/07/2021 on her ILR.  She established with atrial fibrillation clinic on 06/10/2021, she was started on Eliquis  5 mg twice daily at that time.  She established with Dr. Alvan on 07/2021.  She was doing well at the time and medication management was continued.   She was seen in clinic on 06/22/2023.  She reported feeling that she was going in and out of atrial fibrillation.  Her ILR was checked showing 920 A-fib episodes with 23.7% burden over the past month.  She was referred back to atrial fibrillation clinic.  She was seen by A-fib clinic on 07/26/2023.  Rhythm control options were discussed however patient would like to avoid long-term medications.  She was referred to EP to discuss ablation.  Patient was last seen on 08/12/2023 by Dr. HOLTS.  She was symptomatic and having an increase in A-fib burden.  She is now scheduled for ablation on 11/04/2023. She  underwent cardiac CT imaging on 10/14/2023 prior to ablation showing CAC score of 1665 which is 99th percentile for age, race, sex.  She was recently admitted on 10/08/2023 for hyperkalemia.  She was given Lokelma  10 mg p.o. x 2 with improvement in her potassium.  Her spironolactone  was reduced from 25 mg daily to 12.5 mg daily and her lisinopril  was discontinued.   Discussed the use of AI scribe software for clinical note transcription with the patient, who gave verbal consent to proceed.  History of Present Illness [name] is a 63 year old female with atrial fibrillation who presents for follow-up.  Today patient is doing well overall.  She continues to take care of her 3 grandchildren daily.  She is without any chest pains at this time.  She continues to experience fatigue and shortness of breath.  Dyspnea occurs only when she overexerts herself.  However this has been relatively stable and has not worsened.  She feels after her spironolactone  was reduced she has had some mild increase in her lower extremity swelling.  She does not maintain a low-sodium diet.  She does elevate her legs throughout the day.  She denies any orthopnea or PND.  She has been without any syncope, presyncope, lightheadedness, dizziness.   Review of systems:  Please see the history of present illness. All other systems are reviewed and otherwise negative.      Studies Reviewed:    EKG Interpretation Date/Time:  Tuesday October 25 2023 08:53:18 EDT Ventricular Rate:  80 PR Interval:    QRS Duration:  92 QT Interval:  354 QTC Calculation: 408 R Axis:   -59  Text Interpretation: Atrial fibrillation Left axis deviation Anteroseptal infarct , age undetermined When compared with ECG of 08-Oct-2023 10:20, PREVIOUS ECG IS PRESENT Confirmed by Rana Dixon [phone]) on 10/25/2023 9:44:47 PM    Echocardiogram 10/16/2020 1. Left ventricular ejection fraction, by estimation, is 60 to 65%. The  left ventricle has  normal function. The left ventricle has no regional  wall motion abnormalities. There is mild concentric left ventricular  hypertrophy. Left ventricular diastolic  parameters are indeterminate but suggestive of at elevated left atrial  pressure.   2. Right ventricular systolic function is normal. The right ventricular  size is normal. Tricuspid regurgitation signal is inadequate for assessing  PA pressure.   3. The mitral valve is normal in structure. No evidence of mitral valve  regurgitation.   4. The aortic valve is tricuspid. There is mild thickening of the aortic  valve. Aortic valve regurgitation is not visualized.   5. The inferior vena cava is normal in size with greater than 50%  respiratory variability, suggesting right atrial pressure of 3 mmHg.  Risk Assessment/Calculations:    CHA2DS2-VASc Score = 6   This indicates a 9.7% annual risk of stroke. The patient's score is based upon: CHF History: 1 HTN History: 1 Diabetes History: 1 Stroke History: 2 Vascular Disease History: 0 Age Score: 0 Gender Score: 1             Physical Exam:   VS:  BP 133/87 (BP Location: Right Arm, Patient Position: Sitting)   Pulse 81   Ht 5' 6 (1.676 m)   Wt 235 lb (106.6 kg)   SpO2 98%   BMI 37.93 kg/m    Wt Readings from Last 3 Encounters:  10/25/23 235 lb (106.6 kg)  10/08/23 226 lb 3.1 oz (102.6 kg)  08/12/23 213 lb (96.6 kg)    GEN: Well nourished, well developed in no acute distress NECK: No JVD; No carotid bruits CARDIAC: Irregular irregular rhythm. No murmurs, rubs, gallops RESPIRATORY:  Clear to auscultation without rales, wheezing or rhonchi  ABDOMEN: Soft, non-tender, non-distended EXTREMITIES:  No edema; No acute deformity      Assessment and Plan:  Persistent atrial fibrillation EKG today shows rate controlled atrial fibrillation.   Most recent ILR check showed 28.5% burden AAD was avoided as patient would like to minimize long-term medications - She is  scheduled for ablation on 11/04/2023 with Dr. Cindie - Continue Metoprolol  Tartrate 100 mg twice daily - Continue Eliquis  5 mg twice daily - BMET today   CHA2DS2-VASc Score = 6 [CHF History: 1, HTN History: 1, Diabetes History: 1, Stroke History: 2, Vascular Disease History: 0, Age Score: 0, Gender Score: 1].  Therefore, the patient's annual risk of stroke is 9.7 %.       Diastolic CHF Most recent echocardiogram 09/2020 with LVEF 60 to 65% Admitted 7/12 for hyperkalemia and spironolactone  was reduced to 12.5 mg daily Unable to use loop diuretic therapy given sulfa allergy - Today she is euvolemic and well compensated on exam.  No fluid volume overload.  Has stable DOE.  Has had mild increase in pedal edema since spironolactone  was decreased.  Weight has increased 7 pounds since last OV.  No orthopnea, PND - She would likely have improvement in symptoms if she is able to maintain NSR after ablation - Plan to start Jardiance  10 mg daily (hold 3  days prior to ablation) - Continue spironolactone  12.5 mg daily - BMET today   Hyperlipidemia, LDL goal < 70 LDL 39 on 05/2023 and under excellent control - Continue atorvastatin  80 mg daily   Hypertension Blood pressure today is 133/87 and slightly above goal - Maintain home BP log and notify office for consistent BP greater than 140/90 - Continue lisinopril  10 mg daily, metoprolol  tartrate 100 mg twice daily   T2DM Managed on Ozempic  and Metformin  - Per PCP  Suspected sleep apnea - Currently pending sleep study      Dispo:  Return in about 3 months (around 01/25/2024).  Former Dr. Alvan patient will have patient establish with Dr. Michele.   Signed, Lum LITTIE Louis, NP

## 2023-10-25 NOTE — Patient Instructions (Signed)
 Medication Instructions:  START JARDIANCE  10 MG DAILY.   Lab Work: BMET TO BE DONE TODAY.   Testing/Procedures: NONE  Follow-Up: At [facility], you and your health needs are our priority.  As part of our continuing mission to provide you with exceptional heart care, our providers are all part of one team.  This team includes your primary Cardiologist (physician) and Advanced Practice Providers or APPs (Physician Assistants and Nurse Practitioners) who all work together to provide you with the care you need, when you need it.  Your next appointment:   3 MONTHS  Provider:   DR. MICHELE, MD [facility], DNP

## 2023-10-26 ENCOUNTER — Other Ambulatory Visit: Payer: Self-pay

## 2023-10-27 ENCOUNTER — Ambulatory Visit: Payer: Self-pay | Admitting: Emergency Medicine

## 2023-10-27 ENCOUNTER — Telehealth (HOSPITAL_COMMUNITY): Payer: Self-pay

## 2023-10-27 DIAGNOSIS — Z79899 Other long term (current) drug therapy: Secondary | ICD-10-CM

## 2023-10-27 DIAGNOSIS — I10 Essential (primary) hypertension: Secondary | ICD-10-CM

## 2023-10-27 NOTE — Telephone Encounter (Signed)
 Spoke with patient to discuss upcoming procedure.   CT: completed.  Labs: completed.   Any recent signs of acute illness or been started on antibiotics? No Any new medications started? Jardiance  Any medications to hold?  Hold Semaglutide  for 1 week prior to the procedure- last dose on today, July 31. Hold Jardiance  for 3 days prior to the procedure- last dose on August 04.  Any missed doses of blood thinner? No Advised patient to continue taking ANTICOAGULANT: Eliquis  (Apixaban ) twice daily without missing any doses.  Medication instructions:  On the morning of your procedure DO NOT take any medication., including Eliquis  or the procedure may be rescheduled. Nothing to eat or drink after midnight prior to your procedure.  Confirmed patient is scheduled for Atrial Fibrillation Ablation on Friday, August 8 with Dr. Ole Holts. Instructed patient to arrive at the Main Entrance A at [facility]: [address] [place], [place] [address] and check in at Admitting at 10:30 AM.  Advised of plan to go home the same day and will only stay overnight if medically necessary. You MUST have a responsible adult to drive you home and MUST be with you the first 24 hours after you arrive home or your procedure could be cancelled.  Patient verbalized understanding to all instructions provided and agreed to proceed with procedure.

## 2023-10-28 ENCOUNTER — Other Ambulatory Visit (HOSPITAL_BASED_OUTPATIENT_CLINIC_OR_DEPARTMENT_OTHER): Payer: Self-pay

## 2023-10-29 ENCOUNTER — Other Ambulatory Visit (HOSPITAL_COMMUNITY): Payer: Self-pay

## 2023-10-31 ENCOUNTER — Ambulatory Visit

## 2023-10-31 DIAGNOSIS — I5032 Chronic diastolic (congestive) heart failure: Secondary | ICD-10-CM

## 2023-11-01 LAB — CUP PACEART REMOTE DEVICE CHECK
Date Time Interrogation Session: 20250802230825
Implantable Pulse Generator Implant Date: 20220307

## 2023-11-02 ENCOUNTER — Other Ambulatory Visit (HOSPITAL_COMMUNITY): Payer: Self-pay

## 2023-11-02 ENCOUNTER — Ambulatory Visit: Payer: Self-pay | Admitting: Cardiology

## 2023-11-03 NOTE — Pre-Procedure Instructions (Signed)
 Instructed patient on the following items: Arrival time 1000 Nothing to eat or drink after midnight No meds AM of procedure Responsible person to drive you home and stay with you for 24 hrs  Have you missed any doses of anti-coagulant Eliquis- takes twice a day, hasn't missed any doses in last 4 weeks.  Don't take dose morning of procedure.

## 2023-11-04 ENCOUNTER — Ambulatory Visit (HOSPITAL_COMMUNITY)
Admission: RE | Admit: 2023-11-04 | Discharge: 2023-11-04 | Disposition: A | Discharge status: Home / Self Care | Attending: Cardiology | Admitting: Cardiology

## 2023-11-04 ENCOUNTER — Telehealth: Payer: Self-pay | Admitting: Emergency Medicine

## 2023-11-04 ENCOUNTER — Ambulatory Visit (HOSPITAL_BASED_OUTPATIENT_CLINIC_OR_DEPARTMENT_OTHER): Payer: Self-pay | Admitting: Anesthesiology

## 2023-11-04 ENCOUNTER — Encounter (HOSPITAL_COMMUNITY)
Admission: RE | Disposition: A | Discharge status: Home / Self Care | Payer: Self-pay | Source: Home / Self Care | Attending: Cardiology

## 2023-11-04 ENCOUNTER — Encounter (HOSPITAL_COMMUNITY): Payer: Self-pay | Admitting: Cardiology

## 2023-11-04 ENCOUNTER — Other Ambulatory Visit: Payer: Self-pay

## 2023-11-04 ENCOUNTER — Other Ambulatory Visit (HOSPITAL_COMMUNITY): Payer: Self-pay

## 2023-11-04 ENCOUNTER — Ambulatory Visit (HOSPITAL_COMMUNITY): Payer: Self-pay | Admitting: Anesthesiology

## 2023-11-04 DIAGNOSIS — I4819 Other persistent atrial fibrillation: Secondary | ICD-10-CM

## 2023-11-04 DIAGNOSIS — I10 Essential (primary) hypertension: Secondary | ICD-10-CM | POA: Diagnosis not present

## 2023-11-04 DIAGNOSIS — E119 Type 2 diabetes mellitus without complications: Secondary | ICD-10-CM

## 2023-11-04 DIAGNOSIS — Z7902 Long term (current) use of antithrombotics/antiplatelets: Secondary | ICD-10-CM | POA: Diagnosis not present

## 2023-11-04 DIAGNOSIS — K219 Gastro-esophageal reflux disease without esophagitis: Secondary | ICD-10-CM | POA: Diagnosis not present

## 2023-11-04 DIAGNOSIS — J45909 Unspecified asthma, uncomplicated: Secondary | ICD-10-CM | POA: Diagnosis not present

## 2023-11-04 DIAGNOSIS — E039 Hypothyroidism, unspecified: Secondary | ICD-10-CM

## 2023-11-04 DIAGNOSIS — Z7901 Long term (current) use of anticoagulants: Secondary | ICD-10-CM | POA: Insufficient documentation

## 2023-11-04 DIAGNOSIS — I4891 Unspecified atrial fibrillation: Secondary | ICD-10-CM

## 2023-11-04 DIAGNOSIS — I25119 Atherosclerotic heart disease of native coronary artery with unspecified angina pectoris: Secondary | ICD-10-CM | POA: Diagnosis not present

## 2023-11-04 DIAGNOSIS — I2510 Atherosclerotic heart disease of native coronary artery without angina pectoris: Secondary | ICD-10-CM | POA: Insufficient documentation

## 2023-11-04 DIAGNOSIS — Z8673 Personal history of transient ischemic attack (TIA), and cerebral infarction without residual deficits: Secondary | ICD-10-CM | POA: Diagnosis not present

## 2023-11-04 HISTORY — PX: ATRIAL FIBRILLATION ABLATION: EP1191

## 2023-11-04 LAB — GLUCOSE, CAPILLARY
Glucose-Capillary: 179 mg/dL — ABNORMAL HIGH (ref 70–99)
Glucose-Capillary: 158 mg/dL — ABNORMAL HIGH (ref 70–99)

## 2023-11-04 LAB — POCT ACTIVATED CLOTTING TIME: Activated Clotting Time: 337 s

## 2023-11-04 SURGERY — ATRIAL FIBRILLATION ABLATION
Anesthesia: General

## 2023-11-04 MED ORDER — PHENYLEPHRINE 80 MCG/ML (10ML) SYRINGE FOR IV PUSH (FOR BLOOD PRESSURE SUPPORT)
PREFILLED_SYRINGE | INTRAVENOUS | Status: DC | PRN
  Administered 2023-11-04: 160 ug via INTRAVENOUS

## 2023-11-04 MED ORDER — ACETAMINOPHEN 325 MG PO TABS: 650.0000 mg | ORAL_TABLET | ORAL | Status: DC | PRN

## 2023-11-04 MED ORDER — ONDANSETRON HCL 4 MG/2ML IJ SOLN: 4.0000 mg | Freq: Four times a day (QID) | INTRAMUSCULAR | Status: DC | PRN

## 2023-11-04 MED ORDER — MIDAZOLAM HCL 2 MG/2ML IJ SOLN
INTRAMUSCULAR | Status: AC
  Filled 2023-11-04: qty 2

## 2023-11-04 MED ORDER — PROTAMINE SULFATE 10 MG/ML IV SOLN
INTRAVENOUS | Status: DC | PRN
  Administered 2023-11-04: 35 mg via INTRAVENOUS

## 2023-11-04 MED ORDER — SUGAMMADEX SODIUM 200 MG/2ML IV SOLN
INTRAVENOUS | Status: DC | PRN
  Administered 2023-11-04: 200 mg via INTRAVENOUS

## 2023-11-04 MED ORDER — SODIUM CHLORIDE 0.9 % IV SOLN: 250.0000 mL | INTRAVENOUS | Status: DC | PRN

## 2023-11-04 MED ORDER — ATROPINE SULFATE 1 MG/10ML IJ SOSY
PREFILLED_SYRINGE | INTRAMUSCULAR | Status: DC | PRN
Start: 2023-11-04 — End: 2023-11-04
  Administered 2023-11-04: 1 mg via INTRAVENOUS

## 2023-11-04 MED ORDER — DEXAMETHASONE SODIUM PHOSPHATE 10 MG/ML IJ SOLN
INTRAMUSCULAR | Status: DC | PRN
Start: 2023-11-04 — End: 2023-11-04
  Administered 2023-11-04: 10 mg via INTRAVENOUS

## 2023-11-04 MED ORDER — HEPARIN SODIUM (PORCINE) 1000 UNIT/ML IJ SOLN
INTRAMUSCULAR | Status: DC | PRN
  Administered 2023-11-04: 16000 [IU] via INTRAVENOUS

## 2023-11-04 MED ORDER — SODIUM CHLORIDE 0.9 % IV SOLN: INTRAVENOUS | Status: DC

## 2023-11-04 MED ORDER — MIDAZOLAM HCL 2 MG/2ML IJ SOLN
INTRAMUSCULAR | Status: DC | PRN
Start: 2023-11-04 — End: 2023-11-04
  Administered 2023-11-04 (×2): 1 mg via INTRAVENOUS

## 2023-11-04 MED ORDER — HEPARIN (PORCINE) IN NACL 1000-0.9 UT/500ML-% IV SOLN
INTRAVENOUS | Status: DC | PRN
  Administered 2023-11-04 (×3): 500 mL

## 2023-11-04 MED ORDER — LIDOCAINE 2% (20 MG/ML) 5 ML SYRINGE
INTRAMUSCULAR | Status: DC | PRN
  Administered 2023-11-04: 100 mg via INTRAVENOUS

## 2023-11-04 MED ORDER — PROPOFOL 10 MG/ML IV BOLUS
INTRAVENOUS | Status: DC | PRN
  Administered 2023-11-04: 150 mg via INTRAVENOUS
  Administered 2023-11-04: 50 ug/kg/min via INTRAVENOUS

## 2023-11-04 MED ORDER — SODIUM CHLORIDE 0.9% FLUSH: 3.0000 mL | INTRAVENOUS | Status: DC | PRN

## 2023-11-04 MED ORDER — COLCHICINE 0.6 MG PO TABS
0.6000 mg | ORAL_TABLET | Freq: Two times a day (BID) | ORAL | Status: DC
  Administered 2023-11-04: 0.6 mg via ORAL
  Filled 2023-11-04: qty 1

## 2023-11-04 MED ORDER — ROCURONIUM BROMIDE 10 MG/ML (PF) SYRINGE
PREFILLED_SYRINGE | INTRAVENOUS | Status: DC | PRN
  Administered 2023-11-04: 60 mg via INTRAVENOUS

## 2023-11-04 MED ORDER — ONDANSETRON HCL 4 MG/2ML IJ SOLN
INTRAMUSCULAR | Status: DC | PRN
  Administered 2023-11-04: 4 mg via INTRAVENOUS

## 2023-11-04 MED ORDER — FENTANYL CITRATE (PF) 100 MCG/2ML IJ SOLN
INTRAMUSCULAR | Status: AC
  Filled 2023-11-04: qty 2

## 2023-11-04 MED ORDER — COLCHICINE 0.6 MG PO TABS
0.6000 mg | ORAL_TABLET | Freq: Two times a day (BID) | ORAL | 0 refills | Status: DC
  Filled 2023-11-04: qty 10, 5d supply, fill #0

## 2023-11-04 MED ORDER — PANTOPRAZOLE SODIUM 40 MG PO TBEC
40.0000 mg | DELAYED_RELEASE_TABLET | Freq: Every day | ORAL | Status: DC
  Administered 2023-11-04: 40 mg via ORAL
  Filled 2023-11-04 (×2): qty 1

## 2023-11-04 MED ORDER — APIXABAN 5 MG PO TABS
5.0000 mg | ORAL_TABLET | Freq: Two times a day (BID) | ORAL | Status: DC
  Administered 2023-11-04: 5 mg via ORAL
  Filled 2023-11-04: qty 1

## 2023-11-04 MED ORDER — FENTANYL CITRATE (PF) 250 MCG/5ML IJ SOLN
INTRAMUSCULAR | Status: DC | PRN
  Administered 2023-11-04: 25 ug via INTRAVENOUS

## 2023-11-04 MED ORDER — PROPOFOL 1000 MG/100ML IV EMUL
INTRAVENOUS | Status: AC
  Filled 2023-11-04: qty 200

## 2023-11-04 MED ORDER — PHENYLEPHRINE HCL-NACL 20-0.9 MG/250ML-% IV SOLN
INTRAVENOUS | Status: DC | PRN
  Administered 2023-11-04: 30 ug/min via INTRAVENOUS

## 2023-11-04 MED ORDER — SODIUM CHLORIDE 0.9% FLUSH: 3.0000 mL | Freq: Two times a day (BID) | INTRAVENOUS | Status: DC

## 2023-11-04 SURGICAL SUPPLY — 18 items
BAG SNAP BAND KOVER 36X36 (MISCELLANEOUS) IMPLANT
CABLE FARASTAR GEN2 SNGL USE (CABLE) IMPLANT
CATH DECANAV D CURVE (CATHETERS) IMPLANT
CATH FARAWAVE 2.0 31 (CATHETERS) IMPLANT
CATH GE 8FR SOUNDSTAR (CATHETERS) IMPLANT
CATH OCTARAY 2.0 F 3-3-3-3-3 (CATHETERS) IMPLANT
CLOSURE PERCLOSE PROSTYLE (VASCULAR PRODUCTS) IMPLANT
COVER SWIFTLINK CONNECTOR (BAG) ×2 IMPLANT
DILATOR VESSEL 38 20CM 16FR (INTRODUCER) IMPLANT
GUIDEWIRE INQWIRE 1.5J.035X260 (WIRE) IMPLANT
KIT VERSACROSS CNCT FARADRIVE (KITS) IMPLANT
PACK EP LF (CUSTOM PROCEDURE TRAY) ×2 IMPLANT
PAD DEFIB RADIO PHYSIO CONN (PAD) ×2 IMPLANT
PATCH CARTO3 (PAD) IMPLANT
SHEATH FARADRIVE STEERABLE (SHEATH) IMPLANT
SHEATH PINNACLE 8F 10CM (SHEATH) IMPLANT
SHEATH PINNACLE 9F 10CM (SHEATH) IMPLANT
SHEATH PROBE COVER 6X72 (BAG) IMPLANT

## 2023-11-04 NOTE — Transfer of Care (Signed)
 Immediate Anesthesia Transfer of Care Note  Patient: [name]  Procedure(s) Performed: ATRIAL FIBRILLATION ABLATION  Patient Location: PACU  Anesthesia Type:General  Level of Consciousness: awake and alert   Airway & Oxygen Therapy: Patient Spontanous Breathing  Post-op Assessment: Report given to RN  Post vital signs: Reviewed and stable  Last Vitals:  Vitals Value Taken Time  BP 127/74 11/04/23 13:21  Temp    Pulse 87 11/04/23 13:24  Resp 16 11/04/23 13:24  SpO2 99 % 11/04/23 13:24  Vitals shown include unfiled device data.  Last Pain: There were no vitals filed for this visit.       Complications: There were no known notable events for this encounter.

## 2023-11-04 NOTE — Discharge Instructions (Signed)

## 2023-11-04 NOTE — Anesthesia Preprocedure Evaluation (Signed)
 Anesthesia Evaluation  Patient identified by MRN, date of birth, ID band Patient awake    Reviewed: Allergy & Precautions, NPO status , Patient's Chart, lab work & pertinent test results  History of Anesthesia Complications (+) PONV, Family history of anesthesia reaction and history of anesthetic complications  Airway Mallampati: II  TM Distance: >3 FB Neck ROM: Full    Dental  (+) Dental Advisory Given, Poor Dentition, Missing, Chipped   Pulmonary asthma    Pulmonary exam normal breath sounds clear to auscultation       Cardiovascular hypertension, Pt. on medications and Pt. on home beta blockers + angina  + CAD  Normal cardiovascular exam Rhythm:Regular Rate:Normal     Neuro/Psych TIA   GI/Hepatic Neg liver ROS,GERD  Medicated,,  Endo/Other  diabetesHypothyroidism    Renal/GU Renal disease     Musculoskeletal negative musculoskeletal ROS (+)    Abdominal   Peds  Hematology  (+) Blood dyscrasia (Plavix )   Anesthesia Other Findings Day of surgery medications reviewed with the patient.  Reproductive/Obstetrics                              Anesthesia Physical Anesthesia Plan  ASA: 3  Anesthesia Plan: General   Post-op Pain Management:    Induction: Intravenous  PONV Risk Score and Plan: 3 and Treatment may vary due to age or medical condition, Ondansetron  and Dexamethasone   Airway Management Planned: Oral ETT  Additional Equipment: None  Intra-op Plan:   Post-operative Plan: Extubation in OR  Informed Consent: I have reviewed the patients History and Physical, chart, labs and discussed the procedure including the risks, benefits and alternatives for the proposed anesthesia with the patient or authorized representative who has indicated his/her understanding and acceptance.     Dental advisory given  Plan Discussed with: CRNA and Anesthesiologist  Anesthesia Plan  Comments: (  )         Anesthesia Quick Evaluation

## 2023-11-04 NOTE — H&P (Signed)
  Electrophysiology Office Follow up Visit Note:     Date:  11/04/2023    ID:  VELERIA BARNHARDT, DOB 08-Aug-1960, MRN [id number]   PCP:  Ilah Crigler, MD           [facility] Cardiologist:  Alvan Ronal BRAVO, MD  [facility] Electrophysiologist:  OLE ONEIDA HOLTS, MD      Interval History:       [name] is a 63 y.o. female who presents for a follow up visit.    The patient was last seen by [facility] [place] July 26, 2023.  She has a history of diabetes, hyperlipidemia, renal cell cancer post cryoablation, TIA, atrial fibrillation.  She had a loop recorder implanted after a TIA in 2022 which demonstrated atrial fibrillation.  Her loop recorder has demonstrated a 25% burden of atrial fibrillation recently.  She takes Eliquis  for stroke prophylaxis.  She is being referred to discuss possible catheter ablation.  Presents for AF ablation. Procedure reviewed.     Objective Past medical, surgical, social and family history were reviewed.   ROS:   Please see the history of present illness.    All other systems reviewed and are negative.   EKGs/Labs/Other Studies Reviewed:     The following studies were reviewed today:   Loop recorder interrogations reviewed.  Recent loop recorder interrogation shows a 34% burden of A-fib.           Physical Exam:     VS:  154/96, 83, 12 AF      Wt Readings from Last 3 Encounters:  07/26/23 221 lb 9.6 oz (100.5 kg)  06/22/23 227 lb (103 kg)  04/22/22 237 lb 3.2 oz (107.6 kg)      GEN: no distress CARD: RRR, No MRG RESP: No IWOB. CTAB.     Assessment ASSESSMENT:     No diagnosis found. PLAN:     In order of problems listed above:   #Persistent atrial fibrillation Symptomatic.  Increasing burden.  Treatment options were discussed with the patient during today's office visit.  We discussed conservative management, antiarrhythmic drugs and catheter ablation.  She is very interested in avoiding long-term use of medications and is  interested in proceeding with catheter ablation.  I discussed the ablation procedure in detail including the risks, recovery and likelihood of success and she wishes to proceed.   Continue Eliquis    Discussed treatment options today for AF including antiarrhythmic drug therapy and ablation. Discussed risks, recovery and likelihood of success with each treatment strategy. Risk, benefits, and alternatives to EP study and ablation for afib were discussed. These risks include but are not limited to stroke, bleeding, vascular damage, tamponade, perforation, damage to the esophagus, lungs, phrenic nerve and other structures, pulmonary vein stenosis, worsening renal function, coronary vasospasm and death.  Discussed potential need for repeat ablation procedures and antiarrhythmic drugs after an initial ablation. The patient understands these risk and wishes to proceed.  We will therefore proceed with catheter ablation at the next available time.  Carto, ICE, anesthesia are requested for the procedure.  Will also obtain CT PV protocol prior to the procedure to exclude LAA thrombus and further evaluate atrial anatomy.    Presents for AF ablation. Procedure reviewed.     Signed, OLE HOLTS, MD, [facility] 11/04/2023 Electrophysiology [facility]

## 2023-11-04 NOTE — Anesthesia Procedure Notes (Signed)
 Procedure Name: Intubation Date/Time: 11/04/2023 11:55 AM  Performed by: Delores Duwaine SAUNDERS, CRNAPre-anesthesia Checklist: Patient identified, Emergency Drugs available, Suction available and Patient being monitored Patient Re-evaluated:Patient Re-evaluated prior to induction Oxygen Delivery Method: Circle System Utilized Preoxygenation: Pre-oxygenation with 100% oxygen Induction Type: IV induction Ventilation: Mask ventilation without difficulty Laryngoscope Size: Mac and 3 Grade View: Grade I Tube type: Oral Tube size: 7.0 mm Number of attempts: 1 Airway Equipment and Method: Stylet and Oral airway Placement Confirmation: ETT inserted through vocal cords under direct vision, positive ETCO2 and breath sounds checked- equal and bilateral Secured at: 21 cm Tube secured with: Tape Dental Injury: Teeth and Oropharynx as per pre-operative assessment

## 2023-11-04 NOTE — Telephone Encounter (Signed)
 Called patient back about message. Patient stated that Jardiance  is giving her a yeast infection. Patient stated if she stops Jardiance  that she would have fluid buildup and she is only on spirolactone 12.5 mg (1/2 a tablet) due to her elevated potassium and creatinine. Will send message to Lum Louis NP for advisement.

## 2023-11-04 NOTE — Telephone Encounter (Signed)
 Pt c/o medication issue:  1. Name of Medication: empagliflozin  (JARDIANCE ) 10 MG TABS tablet   2. How are you currently taking this medication (dosage and times per day)? As written   3. Are you having a reaction (difficulty breathing--STAT)? No   4. What is your medication issue? Pt called in stating she has developed a yeast infection from this medication and over the counter cream is not working fully. She asked if she have something sent in to help with this.

## 2023-11-04 NOTE — Anesthesia Postprocedure Evaluation (Signed)
 Anesthesia Post Note  Patient: [name]  Procedure(s) Performed: ATRIAL FIBRILLATION ABLATION     Patient location during evaluation: PACU Anesthesia Type: General Level of consciousness: awake and alert Pain management: pain level controlled Vital Signs Assessment: post-procedure vital signs reviewed and stable Respiratory status: spontaneous breathing, nonlabored ventilation, respiratory function stable and patient connected to nasal cannula oxygen Cardiovascular status: blood pressure returned to baseline and stable Postop Assessment: no apparent nausea or vomiting Anesthetic complications: no   There were no known notable events for this encounter.  Last Vitals:  Vitals:   11/04/23 1412 11/04/23 1415  BP: 110/75 117/77  Pulse: 86 89  Resp: (!) 21 (!) 22  Temp:    SpO2: 100% 97%    Last Pain:  Vitals:   11/04/23 1410  TempSrc:   PainSc: 0-No pain                 Laruth Hanger

## 2023-11-07 ENCOUNTER — Encounter: Payer: Self-pay | Admitting: Emergency Medicine

## 2023-11-07 ENCOUNTER — Telehealth (HOSPITAL_COMMUNITY): Payer: Self-pay

## 2023-11-07 NOTE — Telephone Encounter (Signed)
 Spoke with patient to complete post procedure follow up call.  Patient reports no complications with groin sites.   Instructions reviewed with patient:  It is normal to have bruising, tenderness, mild swelling, and a pea or marble sized lump/knot at the groin site which can take up to three months to resolve.  Get help right away if you notice sudden swelling at the puncture site.  Check your puncture site every day for signs of infection: fever, redness, swelling, pus drainage, warmth, foul odor or excessive pain. If this occurs, please call the office at [phone], to speak with the nurse. Get help right away if your puncture site is bleeding and the bleeding does not stop after applying firm pressure to the area.  You may continue to have skipped beats/ atrial fibrillation during the first several months after your procedure.  It is very important not to miss any doses of your blood thinner Eliquis .  You will follow up with the Afib clinic on 12/05/23 and follow up with the APP on 02/06/24.    Patient verbalized understanding to all instructions provided.

## 2023-11-07 NOTE — Telephone Encounter (Signed)
 Left message for patient to call back

## 2023-11-07 NOTE — Telephone Encounter (Signed)
 Patient is returning call.

## 2023-11-07 NOTE — Telephone Encounter (Signed)
 Shared response from Katlyn West, NP:  Covering [name]'s inbox while he is out of office. Discussed case with pharmacy team given patient allergies. They recommended using canesten cream for the yeast infection, they noted this can take at least a week to take effect. It was also recommended that she increase her metformin  to 1000 mg twice daily to try to reduce the amount of sugar she is losing through urine which is likely contributing to her yeast infection. They also recommended decreasing her carb intake while the yeast infection is present as this will also help to reduce the amount of sugar. Given her history please place a referral to our pharmacy team for consideration of a GLP-1 agonist and assistance with ongoing management given her allergies.       Patient verbalized understanding and is agreeable to Pharm D referral to discuss GLP-1 agonist. Referral ordered.

## 2023-11-07 NOTE — Telephone Encounter (Signed)
 Covering [name]'s inbox while he is out of office. Discussed case with pharmacy team given patient allergies. They recommended using canesten cream for the yeast infection, they noted this can take at least a week to take effect. It was also recommended that [name] increase her metformin  to 1000 mg twice daily to try to reduce the amount of sugar [name] is losing through urine which is likely contributing to her yeast infection. They also recommended decreasing her carb intake while the yeast infection is present as this will also help to reduce the amount of sugar. Given her history please place a referral to our pharmacy team for consideration of a GLP-1 agonist and assistance with ongoing management given her allergies.     For future reference for Lum Louis, NP: Patient with history of sulfa allergy, [name] is unable to take furosemide . Reviewed with pharmacy, patient should be able to take ethacrynic  acid if [name] requires a diuretic in the future. Would also recommend increasing Jardiance  to 25 mg daily once her yeast infection resolves.

## 2023-11-07 NOTE — Telephone Encounter (Signed)
 Attempted to reach patient to follow up with procedure completed on 11/04/23, no answer. Left VM for patient to return call.

## 2023-11-08 ENCOUNTER — Other Ambulatory Visit (HOSPITAL_COMMUNITY): Payer: Self-pay

## 2023-11-08 ENCOUNTER — Telehealth: Payer: Self-pay

## 2023-11-08 NOTE — Telephone Encounter (Signed)
 Pharmacy Patient Advocate Encounter  Insurance verification completed.   The patient is insured through [facility]   Ran test claim for ETHACRYNIC  ACID MG INJ.  The current 30 day co-pay is, $0.  No PA needed at this time.  This test claim was processed through [facility]- copay amounts may vary at other pharmacies due to pharmacy/plan contracts, or as the patient moves through the different stages of their insurance plan.

## 2023-11-14 ENCOUNTER — Other Ambulatory Visit (HOSPITAL_COMMUNITY): Payer: Self-pay

## 2023-11-16 ENCOUNTER — Other Ambulatory Visit (HOSPITAL_COMMUNITY): Payer: Self-pay

## 2023-11-21 ENCOUNTER — Other Ambulatory Visit (HOSPITAL_COMMUNITY): Payer: Self-pay

## 2023-11-21 ENCOUNTER — Telehealth: Payer: Self-pay | Admitting: Cardiology

## 2023-11-21 NOTE — Telephone Encounter (Signed)
 Called and spoke with to pt.  Reviewed instructions from Lum Louis, NP.  Also advised can use coconut oil to skin that is itching and burning.  She states understanding and will call back if no improvement.

## 2023-11-21 NOTE — Telephone Encounter (Signed)
 Spoke with patient who was started on Jardiance  daily.  She has since deveoped burning and itchy.  Has been using vaginal cream daily without relief.  Now has burning/itchy under arms and breast. It was so bad last night she could not sleep.  She would like to know what she can do for relief. Advised pt to discontinue use and I will notify Lum Louis, NP who ordered.  She reports he had told her if she could not tolerate it, he may prescribe something else.  Aware she will be contacted with further instructions/orders.

## 2023-11-21 NOTE — Telephone Encounter (Signed)
 Pt c/o medication issue:  1. Name of Medication:   empagliflozin  (JARDIANCE ) 10 MG TABS tablet   2. How are you currently taking this medication (dosage and times per day)?   As prescribed  3. Are you having a reaction (difficulty breathing--STAT)?   Yeast infection with itching  4. What is your medication issue?   Patient stated this medication is giving her a yeast infection and the cream is not helping.  Patient wants advice on next steps.

## 2023-11-25 ENCOUNTER — Telehealth: Payer: Self-pay

## 2023-11-25 NOTE — Telephone Encounter (Signed)
 Alert remote transmission: AF. 44 AF episodes since 10/29/23, longest 6 hrs 28 min on 11/22/23 at 11:55, V rates are controlled, Burden 28.1% since 10/26/22, Trends illustrate an increased AF burden since 11/19/23, on Eliquis  per Epic. Cannot exclude ongoing AF vs SR with PAC ectopy on presenting EGM due to noisy baseline; routed to alert group for review  Patient had an AF ablation on 8/825. Since approx the 8/23 patient has had significant uptick in AF events.  Spoke with patient and she notices SOB and some chest tightness with exertion again.  Also notes increased fatigue since procedure.  She says that its not as severe as before the procedure but can tell she is back in AF.    Patient sees Clint R. Fenton PA in AF clinic on 12/05/23, will forward as FYI.  We will continue monitoring.       SABRA

## 2023-11-28 ENCOUNTER — Other Ambulatory Visit: Payer: Self-pay

## 2023-12-01 ENCOUNTER — Ambulatory Visit (INDEPENDENT_AMBULATORY_CARE_PROVIDER_SITE_OTHER)

## 2023-12-01 DIAGNOSIS — I5032 Chronic diastolic (congestive) heart failure: Secondary | ICD-10-CM

## 2023-12-01 LAB — CUP PACEART REMOTE DEVICE CHECK
Date Time Interrogation Session: 20250903231223
Implantable Pulse Generator Implant Date: 20220307

## 2023-12-02 ENCOUNTER — Telehealth

## 2023-12-02 ENCOUNTER — Other Ambulatory Visit (HOSPITAL_COMMUNITY): Payer: Self-pay

## 2023-12-02 DIAGNOSIS — L02211 Cutaneous abscess of abdominal wall: Secondary | ICD-10-CM | POA: Diagnosis not present

## 2023-12-02 MED ORDER — DOXYCYCLINE HYCLATE 100 MG PO TABS
100.0000 mg | ORAL_TABLET | Freq: Two times a day (BID) | ORAL | 0 refills | Status: DC
  Filled 2023-12-02: qty 14, 7d supply, fill #0

## 2023-12-02 NOTE — Patient Instructions (Signed)
 [name], thank you for joining Delon CHRISTELLA Dickinson, PA-C for today's virtual visit.  While this provider is not your primary care provider (PCP), if your PCP is located in our provider database this encounter information will be shared with them immediately following your visit.   A [facility] MyChart account gives you access to today's visit and all your visits, tests, and labs performed at [facility]  click here if you don't have a [facility] MyChart account or go to [email or web address]  Consent: (Patient) [name] provided verbal consent for this virtual visit at the beginning of the encounter.  Current Medications:  Current Outpatient Medications:    doxycycline  (VIBRA -TABS) 100 MG tablet, Take 1 tablet (100 mg total) by mouth 2 (two) times daily., Disp: 14 tablet, Rfl: 0   acetaminophen  (TYLENOL ) 500 MG tablet, Take 2 tablets (1,000 mg total) by mouth every 6 (six) hours as needed (body aches)., Disp: 30 tablet, Rfl: 0   albuterol  (VENTOLIN  HFA) 108 (90 Base) MCG/ACT inhaler, Inhale 2 puffs every 4 to 6 hours as needed., Disp: 18 g, Rfl: 2   apixaban  (ELIQUIS ) 5 MG TABS tablet, Take 1 tablet (5 mg total) by mouth 2 (two) times daily., Disp: 60 tablet, Rfl: 5   atorvastatin  (LIPITOR) 80 MG tablet, Take 1 tablet (80 mg total) by mouth at bedtime., Disp: 90 tablet, Rfl: 3   Azelastine  HCl 137 MCG/SPRAY SOLN, Place 1 spray into the nose 2 (two) times daily. (Patient taking differently: Place 1 spray into the nose 2 (two) times daily as needed (allergies).), Disp: 30 mL, Rfl: 0   CALCIUM -MAGNESIUM -ZINC PO, Take 1 tablet by mouth daily., Disp: , Rfl:    colchicine  0.6 MG tablet, Take 1 tablet (0.6 mg total) by mouth 2 (two) times daily for 5 days., Disp: 10 tablet, Rfl: 0   Continuous Glucose Sensor (FREESTYLE LIBRE 3 SENSOR) MISC, Change sensor every two weeks, Disp: 6 each, Rfl: 3   diclofenac  Sodium (VOLTAREN ) 1 % GEL, Apply to affected area(s) twice daily  as directed (Patient taking differently: Apply 2 g topically 2 (two) times daily as needed (pain).), Disp: 100 g, Rfl: 0   diphenhydrAMINE  (BENADRYL ) 25 MG tablet, Take 25 mg by mouth at bedtime., Disp: , Rfl:    ibuprofen  (ADVIL ) 200 MG tablet, Take 800 mg by mouth every 8 (eight) hours as needed for headache or moderate pain., Disp: , Rfl:    levothyroxine  (SYNTHROID ) 150 MCG tablet, Take 1 tablet (150 mcg total) by mouth daily., Disp: 90 tablet, Rfl: 3   melatonin 3 MG TABS tablet, Take 3 mg by mouth at bedtime., Disp: , Rfl:    metFORMIN  (GLUCOPHAGE ) 500 MG tablet, Take 1 tablet (500 mg total) by mouth 2 (two) times daily with a meal., Disp: 60 tablet, Rfl: 4   metoprolol  tartrate (LOPRESSOR ) 100 MG tablet, Take 1 tablet (100 mg total) by mouth 2 (two) times daily., Disp: 60 tablet, Rfl: 5   pantoprazole  (PROTONIX ) 40 MG tablet, Take 1 tablet (40 mg total) by mouth daily., Disp: 90 tablet, Rfl: 1   Probiotic Product (PROBIOTIC PO), Take 1 tablet by mouth daily., Disp: , Rfl:    Semaglutide , 1 MG/DOSE, (OZEMPIC , 1 MG/DOSE,) 4 MG/3ML SOPN, Inject 1 mg into the skin once a week. (Patient not taking: Reported on 10/31/2023), Disp: 9 mL, Rfl: 2   spironolactone  (ALDACTONE ) 25 MG tablet, Take 0.5 tablets (12.5 mg total) by mouth daily., Disp: , Rfl:    zinc gluconate  50 MG tablet, Take 50 mg by mouth daily as needed (flu season)., Disp: , Rfl:    Medications ordered in this encounter:  Meds ordered this encounter  Medications   doxycycline  (VIBRA -TABS) 100 MG tablet    Sig: Take 1 tablet (100 mg total) by mouth 2 (two) times daily.    Dispense:  14 tablet    Refill:  0    Supervising Provider:   BLAISE ALEENE KIDD [[id number]]     *If you need refills on other medications prior to your next appointment, please contact your pharmacy*  Follow-Up: Call back or seek an in-person evaluation if the symptoms worsen or if the condition fails to improve as anticipated.  [facility] [phone]  Other Instructions Skin Abscess  A skin abscess is an infected spot of skin. It can have pus in it. An abscess can happen in any part of your body. Some abscesses break open (rupture) on their own. Most keep getting worse unless they are treated. If your abscess is not treated, the infection can spread deeper into your body and blood. This can make you feel sick. What are the causes? Germs that enter your skin. This may happen if you have: A cut or scrape. A wound from a needle or an insect bite. Blocked oil or sweat glands. A problem with the spot where your hair goes into your skin. A fluid-filled sac called a cyst under your skin. What increases the risk? Having problems with how your blood moves through your body. Having a weak body defense system (immune system). Having diabetes. Having dry and irritated skin. Needing to get shots often. Putting drugs into your body with a needle. Having a splinter or something else in your skin. Smoking. What are the signs or symptoms? A firm bump under your skin that hurts. A bump with pus at the top. Redness and swelling. Warm or tender spots. A sore on the skin. How is this treated? You may need to: Put a heat pack or a warm, wet washcloth on the spot. Have the pus drained. Take antibiotics. Follow these instructions at home: Medicines Take over-the-counter and prescription medicines only as told by your doctor. If you were prescribed antibiotics, take them as told by your doctor. Do not stop taking them even if you start to feel better. Abscess care  If you have an abscess that has not drained, put heat on it. Use the heat source that your doctor recommends, such as a moist heat pack or a heating pad. Place a towel between your skin and the heat source. Leave the heat on for 20-30 minutes. If your skin turns bright red, take off the heat right away to prevent burns. The risk of burns is higher if you cannot feel pain,  heat, or cold. Follow instructions from your doctor about how to take care of your abscess. Make sure you: Cover the abscess with a bandage. Wash your hands with soap and water for at least 20 seconds before and after you change your bandage. If you cannot use soap and water, use hand sanitizer. Change your bandage as told by your doctor. Check your abscess every day for signs that the infection is getting worse. Check for: More redness, swelling, or pain. More fluid or blood. Warmth. More pus or a worse smell. General instructions To keep the infection from spreading: Do not share personal items or towels. Do not go in a hot tub with others. Avoid making  skin contact with others. Be careful when you get rid of used bandages or any pus from the abscess. Do not smoke or use any products that contain nicotine or tobacco. If you need help quitting, ask your doctor. Contact a doctor if: You see red streaks on your skin near the abscess. You have any signs of worse infection. You vomit every time you eat or drink. You have a fever, chills, or muscle aches. The cyst or abscess comes back. Get help right away if: You have very bad pain. You make less pee (urine) than normal. This information is not intended to replace advice given to you by your health care provider. Make sure you discuss any questions you have with your health care provider. Document Revised: 10/28/2021 Document Reviewed: 10/28/2021 Elsevier Patient Education  2024 Elsevier Inc.   If you have been instructed to have an in-person evaluation today at a local Urgent Care facility, please use the link below. It will take you to a list of all of our available [facility], including address, phone number and hours of operation. Please do not delay care.  [facility]  If you or a family member do not have a primary care provider, use the link below to schedule a visit and establish care. When you  choose a [facility] primary care physician or advanced practice provider, you gain a long-term partner in health. Find a Primary Care Provider  Learn more about 's in-office and virtual care options:  - Get Care Now

## 2023-12-02 NOTE — Progress Notes (Signed)
 Virtual Visit Consent   [name], you are scheduled for a virtual visit with a [facility] provider today. Just as with appointments in the office, your consent must be obtained to participate. Your consent will be active for this visit and any virtual visit you may have with one of our providers in the next 365 days. If you have a MyChart account, a copy of this consent can be sent to you electronically.  As this is a virtual visit, video technology does not allow for your provider to perform a traditional examination. This may limit your provider's ability to fully assess your condition. If your provider identifies any concerns that need to be evaluated in person or the need to arrange testing (such as labs, EKG, etc.), we will make arrangements to do so. Although advances in technology are sophisticated, we cannot ensure that it will always work on either your end or our end. If the connection with a video visit is poor, the visit may have to be switched to a telephone visit. With either a video or telephone visit, we are not always able to ensure that we have a secure connection.  By engaging in this virtual visit, you consent to the provision of healthcare and authorize for your insurance to be billed (if applicable) for the services provided during this visit. Depending on your insurance coverage, you may receive a charge related to this service.  I need to obtain your verbal consent now. Are you willing to proceed with your visit today? [name] has provided verbal consent on 12/02/2023 for a virtual visit (video or telephone). Delon CHRISTELLA Dickinson, PA-C  Date: 12/02/2023 8:26 AM   Virtual Visit via Video Note   I, Delon CHRISTELLA Dickinson, connected with  [name]  ([id number], 02/07/1961) on 12/02/23 at  8:15 AM EDT by a video-enabled telemedicine application and verified that I am speaking with the correct person using two identifiers.  Location: Patient: Virtual Visit  Location Patient: Home Provider: Virtual Visit Location Provider: Home Office   I discussed the limitations of evaluation and management by telemedicine and the availability of in person appointments. The patient expressed understanding and agreed to proceed.    History of Present Illness: [name] is a 63 y.o. who identifies as a female who was assigned female at birth, and is being seen today for small abscess on left abdominal wall. Had a bad skin yeast infection from Jardiance  prior to onset. Was taken off Jardiance  and yeast has been successfully treated but continues to have an open sore on the left abdominal wall. It has started to become more tender to touch. She has been trying OTC antibiotic ointment and coconut oil with no relief. She denies fevers, chills, nausea, vomiting.   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 05/16/2016   Fatty liver disease, 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/16/2016   Slurred speech 09/13/2015   Parotitis 09/13/2015   Gastroenteritis 08/21/2015  Hypothyroid 08/21/2015   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   Pain in the chest    Essential hypertension    Chest pain 04/23/2014   Anginal pain (HCC) 04/23/2014   Type 2 diabetes mellitus (HCC)    Hypertension    Hypercholesteremia     Allergies:  Allergies  Allergen Reactions   Amoxicillin  Other (See  Comments) and Anaphylaxis    Resulted in ambulance per pt   Furosemide  Anaphylaxis and Swelling    Patient states has reaction based on sulfa allergy   Lorabid [Loracarbef] Anaphylaxis   Augmentin  [Amoxicillin -Pot Clavulanate] Hives, Nausea And Vomiting and Other (See Comments)    Chest pain   Codeine Nausea And Vomiting   Gadolinium Derivatives Itching, Swelling and Other (See Comments)    Patient will require 13 hr prep prior to administration of gadolinium     Sulfa Drugs Cross Reactors Other (See Comments)    Severe allergic reaction as a child - was not told symptoms...    Benicar [Olmesartan Medoxomil] Swelling and Rash   Other Rash    Green peas   Medications:  Current Outpatient Medications:    doxycycline  (VIBRA -TABS) 100 MG tablet, Take 1 tablet (100 mg total) by mouth 2 (two) times daily., Disp: 14 tablet, Rfl: 0   acetaminophen  (TYLENOL ) 500 MG tablet, Take 2 tablets (1,000 mg total) by mouth every 6 (six) hours as needed (body aches)., Disp: 30 tablet, Rfl: 0   albuterol  (VENTOLIN  HFA) 108 (90 Base) MCG/ACT inhaler, Inhale 2 puffs every 4 to 6 hours as needed., Disp: 18 g, Rfl: 2   apixaban  (ELIQUIS ) 5 MG TABS tablet, Take 1 tablet (5 mg total) by mouth 2 (two) times daily., Disp: 60 tablet, Rfl: 5   atorvastatin  (LIPITOR) 80 MG tablet, Take 1 tablet (80 mg total) by mouth at bedtime., Disp: 90 tablet, Rfl: 3   Azelastine  HCl 137 MCG/SPRAY SOLN, Place 1 spray into the nose 2 (two) times daily. (Patient taking differently: Place 1 spray into the nose 2 (two) times daily as needed (allergies).), Disp: 30 mL, Rfl: 0   CALCIUM -MAGNESIUM -ZINC PO, Take 1 tablet by mouth daily., Disp: , Rfl:    colchicine  0.6 MG tablet, Take 1 tablet (0.6 mg total) by mouth 2 (two) times daily for 5 days., Disp: 10 tablet, Rfl: 0   Continuous Glucose Sensor (FREESTYLE LIBRE 3 SENSOR) MISC, Change sensor every two weeks, Disp: 6 each, Rfl: 3   diclofenac  Sodium (VOLTAREN ) 1 % GEL, Apply to affected  area(s) twice daily as directed (Patient taking differently: Apply 2 g topically 2 (two) times daily as needed (pain).), Disp: 100 g, Rfl: 0   diphenhydrAMINE  (BENADRYL ) 25 MG tablet, Take 25 mg by mouth at bedtime., Disp: , Rfl:    ibuprofen  (ADVIL ) 200 MG tablet, Take 800 mg by mouth every 8 (eight) hours as needed for headache or moderate pain., Disp: , Rfl:    levothyroxine  (SYNTHROID ) 150 MCG tablet, Take 1 tablet (150 mcg total) by mouth daily., Disp: 90 tablet, Rfl: 3   melatonin 3 MG TABS tablet, Take 3 mg by mouth at bedtime., Disp: , Rfl:    metFORMIN  (GLUCOPHAGE ) 500 MG tablet, Take 1 tablet (500 mg total) by mouth 2 (two) times daily with a meal., Disp: 60 tablet, Rfl: 4   metoprolol  tartrate (LOPRESSOR ) 100 MG tablet, Take 1 tablet (100 mg total) by mouth 2 (two) times daily., Disp: 60 tablet, Rfl: 5   pantoprazole  (PROTONIX ) 40 MG tablet, Take  1 tablet (40 mg total) by mouth daily., Disp: 90 tablet, Rfl: 1   Probiotic Product (PROBIOTIC PO), Take 1 tablet by mouth daily., Disp: , Rfl:    Semaglutide , 1 MG/DOSE, (OZEMPIC , 1 MG/DOSE,) 4 MG/3ML SOPN, Inject 1 mg into the skin once a week. (Patient not taking: Reported on 10/31/2023), Disp: 9 mL, Rfl: 2   spironolactone  (ALDACTONE ) 25 MG tablet, Take 0.5 tablets (12.5 mg total) by mouth daily., Disp: , Rfl:    zinc gluconate 50 MG tablet, Take 50 mg by mouth daily as needed (flu season)., Disp: , Rfl:   Observations/Objective: Patient is well-developed, well-nourished in no acute distress.  Resting comfortably at home.  Head is normocephalic, atraumatic.  No labored breathing.  Speech is clear and coherent with logical content.  Patient is alert and oriented at baseline.  Annular open wound with dry eschar covering noted on the left abdominal wall with surrounding erythema, total diameter about 1-2cm; reports tender to touch but soft  Assessment and Plan: 1. Abscess of abdominal wall (Primary) - doxycycline  (VIBRA -TABS) 100 MG tablet;  Take 1 tablet (100 mg total) by mouth 2 (two) times daily.  Dispense: 14 tablet; Refill: 0  - Will add Doxycycline  - Continue to keep clean and dry - Warm compresses as needed - May use tylenol  for pain if needed - Seek in person evaluation if worsening or fails to resolve  Follow Up Instructions: I discussed the assessment and treatment plan with the patient. The patient was provided an opportunity to ask questions and all were answered. The patient agreed with the plan and demonstrated an understanding of the instructions.  A copy of instructions were sent to the patient via MyChart unless otherwise noted below.    The patient was advised to call back or seek an in-person evaluation if the symptoms worsen or if the condition fails to improve as anticipated.    Delon CHRISTELLA Dickinson, PA-C

## 2023-12-03 ENCOUNTER — Ambulatory Visit: Payer: Self-pay | Admitting: Cardiology

## 2023-12-05 ENCOUNTER — Encounter (HOSPITAL_COMMUNITY): Payer: Self-pay | Admitting: Physician Assistant

## 2023-12-05 ENCOUNTER — Ambulatory Visit (HOSPITAL_COMMUNITY)
Admit: 2023-12-05 | Discharge: 2023-12-05 | Disposition: A | Discharge status: Home / Self Care | Attending: Physician Assistant | Admitting: Physician Assistant

## 2023-12-05 VITALS — BP 126/80 | HR 76 | Ht 66.0 in | Wt 232.2 lb

## 2023-12-05 DIAGNOSIS — I484 Atypical atrial flutter: Secondary | ICD-10-CM

## 2023-12-05 DIAGNOSIS — I480 Paroxysmal atrial fibrillation: Secondary | ICD-10-CM

## 2023-12-05 DIAGNOSIS — D6869 Other thrombophilia: Secondary | ICD-10-CM | POA: Diagnosis not present

## 2023-12-05 DIAGNOSIS — I4819 Other persistent atrial fibrillation: Secondary | ICD-10-CM

## 2023-12-05 NOTE — Patient Instructions (Addendum)
 Cardioversion scheduled for: Monday, September 15th   - Arrive at the [facility] ([address]  and check in with ADMITTING at 1030 AM   - Do not eat or drink anything after midnight the night prior to your procedure.   - Take all your morning medication (except diabetic medications) with a sip of water prior to arrival.  - Do NOT miss any doses of your blood thinner - if you should miss a dose or take a dose more than 4 hours late -- please notify our office immediately.  - You will not be able to drive home after your procedure. Please ensure you have a responsible adult to drive you home. You will need someone with you for 24 hours post procedure.     - Expect to be in the procedural area approximately 2 hours.   - If you feel as if you go back into normal rhythm prior to scheduled cardioversion, please notify our office immediately.   If your procedure is canceled in the cardioversion suite you will be charged a cancellation fee.    Hold below medications 7 days prior to scheduled procedure/anesthesia.  Restart medication on the normal dosing day after scheduled procedure/anesthesia  Semaglutide  (Ozempic )         For those patients who have a scheduled procedure/anesthesia on the same day of the week as their dose, hold the medication on the day of surgery.  They can take their scheduled dose the week before.  **Patients on the above medications scheduled for elective procedures that have not held the medication for the appropriate amount of time are at risk of cancellation or change in the anesthetic plan.

## 2023-12-05 NOTE — Progress Notes (Signed)
 Primary Care Physician: Ilah Crigler, MD Primary Cardiologist: none Primary Electrophysiologist: Dr Cindie Referring Physician: Dr Kelsie Dorthea [name] is a 63 y.o. female with a history of DM, HLD, HTN, renal cancer s/p right renal cryoablation, TIA, atrial fibrillation who presents for follow up in the [facility].  The patient was initially diagnosed with atrial fibrillation 06/07/21 on her ILR which was placed 05/2020 for TIA. She was seen by Dr Cindie and underwent afib ablation on 11/04/23.   Patient returns for follow up for atrial fibrillation. She reports that she felt great for two weeks post ablation with much more energy. However, her fatigue and palpitations returned and have been fairly consistent since. There were no specific triggers that she could identify. No groin issues.   Today, she  denies symptoms of chest pain, orthopnea, PND, lower extremity edema, dizziness, presyncope, syncope, snoring, daytime somnolence, bleeding, or neurologic sequela. The patient is tolerating medications without difficulties and is otherwise without complaint today.    Atrial Fibrillation Risk Factors:  she does have symptoms or diagnosis of sleep apnea. she does not have a history of rheumatic fever. she does not have a history of alcohol use. The patient does not have a history of early familial atrial fibrillation or other arrhythmias.   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    Asthma    due to allergies    Cancer (HCC)    THRYOID 2016 [place] WITH SURGERY, KIDNEY CANCER MAY 2018  RIGHT RENAL CRYOABLATION   Complication of anesthesia    Diabetes mellitus    TYPE 2   Family history of adverse reaction to anesthesia    difficult  for father to wake after surgery, AFTHER LOW BP WITH ANESTHESIA   HOH (hard of hearing)    Hypercholesteremia    Hypertension    PONV (postoperative nausea and vomiting)    Renal cancer, right (HCC) dx'd 07/2018, 10/2018   ablation x 2    Thyroid  disease    TIA (transient ischemic attack)    LAST TIA  APRIL 2020 ON PLAVIX , reports residual effects ,02/2020    Current Outpatient Medications  Medication Sig Dispense Refill   acetaminophen  (TYLENOL ) 500 MG tablet Take 2 tablets (1,000 mg total) by mouth every 6 (six) hours as needed (body aches). 30 tablet 0   albuterol  (VENTOLIN  HFA) 108 (90 Base) MCG/ACT inhaler Inhale 2 puffs every 4 to 6 hours as needed. 18 g 2   apixaban  (ELIQUIS ) 5 MG TABS tablet Take 1 tablet (5 mg total) by mouth 2 (two) times daily. 60 tablet 5   atorvastatin  (LIPITOR) 80 MG tablet Take 1 tablet (80 mg total) by mouth at bedtime. 90 tablet 3   Azelastine  HCl 137 MCG/SPRAY SOLN Place 1 spray into the nose 2 (two) times daily. (Patient taking differently: Place 1 spray into the nose 2 (two) times daily as needed (allergies).) 30 mL 0   CALCIUM -MAGNESIUM -ZINC PO Take 1 tablet by mouth daily.     Continuous Glucose Sensor (FREESTYLE LIBRE 3 SENSOR) MISC Change sensor every two weeks 6 each 3   diclofenac  Sodium (VOLTAREN ) 1 % GEL Apply to affected area(s) twice daily as directed (Patient taking differently: Apply 2 g topically 2 (two)  times daily as needed (pain).) 100 g 0   diphenhydrAMINE  (BENADRYL ) 25 MG tablet Take 25 mg by mouth at bedtime.     doxycycline  (VIBRA -TABS) 100 MG tablet Take 1 tablet (100 mg total) by mouth 2 (two) times daily. 14 tablet 0   ibuprofen  (ADVIL ) 200 MG tablet Take 800 mg by mouth every 8 (eight) hours as needed for headache or moderate pain.     levothyroxine  (SYNTHROID ) 150 MCG tablet Take 1 tablet (150 mcg total) by mouth daily. 90 tablet 3   melatonin 3 MG TABS tablet Take 3 mg by mouth at bedtime.     metFORMIN  (GLUCOPHAGE ) 500 MG tablet  Take 1 tablet (500 mg total) by mouth 2 (two) times daily with a meal. 60 tablet 4   metoprolol  tartrate (LOPRESSOR ) 100 MG tablet Take 1 tablet (100 mg total) by mouth 2 (two) times daily. 60 tablet 5   pantoprazole  (PROTONIX ) 40 MG tablet Take 1 tablet (40 mg total) by mouth daily. 90 tablet 1   Probiotic Product (PROBIOTIC PO) Take 1 tablet by mouth daily.     Semaglutide , 1 MG/DOSE, (OZEMPIC , 1 MG/DOSE,) 4 MG/3ML SOPN Inject 1 mg into the skin once a week. 9 mL 2   spironolactone  (ALDACTONE ) 25 MG tablet Take 0.5 tablets (12.5 mg total) by mouth daily.     zinc gluconate 50 MG tablet Take 50 mg by mouth daily as needed (flu season).     No current facility-administered medications for this encounter.    ROS- All systems are reviewed and negative except as per the HPI above.  Physical Exam: Vitals:   12/05/23 1127  BP: 126/80  Pulse: 76  Weight: 105.3 kg  Height: 5' 6 (1.676 m)     GEN: Well nourished, well developed in no acute distress CARDIAC: Regular rate and rhythm, no murmurs, rubs, gallops RESPIRATORY:  Clear to auscultation without rales, wheezing or rhonchi  ABDOMEN: Soft, non-tender, non-distended EXTREMITIES:  No edema; No deformity    Wt Readings from Last 3 Encounters:  12/05/23 105.3 kg  11/04/23 106.1 kg  10/25/23 106.6 kg    EKG today demonstrates  Atypical atrial flutter with 3:1 block Vent. rate 76 BPM PR interval 168 ms QRS duration 98 ms QT/QTcB 382/429 ms   Echo 10/16/20 demonstrated   1. Left ventricular ejection fraction, by estimation, is 60 to 65%. The  left ventricle has normal function. The left ventricle has no regional  wall motion abnormalities. There is mild concentric left ventricular  hypertrophy. Left ventricular diastolic parameters are indeterminate but suggestive of at elevated left atrial pressure.   2. Right ventricular systolic function is normal. The right ventricular  size is normal. Tricuspid regurgitation signal is  inadequate for assessing PA pressure.   3. The mitral valve is normal in structure. No evidence of mitral valve regurgitation.   4. The aortic valve is tricuspid. There is mild thickening of the aortic  valve. Aortic valve regurgitation is not visualized.   5. The inferior vena cava is normal in size with greater than 50%  respiratory variability, suggesting right atrial pressure of 3 mmHg.   Comparison(s): A prior study was performed on 03/10/2020. No significant change from prior study. Prior images reviewed side by side.   Epic records are reviewed at length today  CHA2DS2-VASc Score = 6  The patient's score is based upon: CHF History: 1 HTN History: 1 Diabetes History: 1 Stroke History: 2 Vascular Disease History: 0 Age Score: 0 Gender Score:  1       ASSESSMENT AND PLAN: Persistent Atrial Fibrillation/atrial flutter (ICD10:  I48.19) The patient's CHA2DS2-VASc score is 6, indicating a 9.7% annual risk of stroke.   S/p afib ablation 11/04/23 Patient in atrial flutter today, suspect she has been persistent based on symptoms. ILR possibly undersensing with regular flutter. We discussed rhythm control options today. Will plan for DCCV. Check bmet/cbc.  Continue Eliquis  5 mg BID with no missed doses for 3 months post ablation.  Continue Lopressor  100 mg BID  Secondary Hypercoagulable State (ICD10:  D68.69) The patient is at significant risk for stroke/thromboembolism based upon her CHA2DS2-VASc Score of 6.  Continue Apixaban  (Eliquis ). No bleeding issues.   Obesity Body mass index is 37.48 kg/m.  Encouraged lifestyle modification  HTN Stable on current regimen   Follow up in the AF clinic post DCCV.    Informed Consent   Shared Decision Making/Informed Consent The risks (stroke, cardiac arrhythmias rarely resulting in the need for a temporary or permanent pacemaker, skin irritation or burns and complications associated with conscious sedation including aspiration,  arrhythmia, respiratory failure and death), benefits (restoration of normal sinus rhythm) and alternatives of a direct current cardioversion were explained in detail to Ms. Delauter and she agrees to proceed.       Daril Kicks PA-C [facility] [address] [place], [place] [address] [phone] 12/05/2023 12:06 PM

## 2023-12-06 ENCOUNTER — Other Ambulatory Visit (HOSPITAL_COMMUNITY): Payer: Self-pay | Admitting: *Deleted

## 2023-12-06 ENCOUNTER — Ambulatory Visit (HOSPITAL_COMMUNITY): Payer: Self-pay | Admitting: Physician Assistant

## 2023-12-06 DIAGNOSIS — I4819 Other persistent atrial fibrillation: Secondary | ICD-10-CM

## 2023-12-06 LAB — BASIC METABOLIC PANEL WITH GFR
BUN/Creatinine Ratio: 10 — ABNORMAL LOW (ref 12–28)
BUN: 11 mg/dL (ref 8–27)
CO2: 17 mmol/L — ABNORMAL LOW (ref 20–29)
Calcium: 8.3 mg/dL — ABNORMAL LOW (ref 8.7–10.3)
Chloride: 109 mmol/L — ABNORMAL HIGH (ref 96–106)
Creatinine, Ser: 1.1 mg/dL — ABNORMAL HIGH (ref 0.57–1.00)
Glucose: 150 mg/dL — ABNORMAL HIGH (ref 70–99)
Potassium: 4.1 mmol/L (ref 3.5–5.2)
Sodium: 142 mmol/L (ref 134–144)
eGFR: 57 mL/min/{1.73_m2} — ABNORMAL LOW

## 2023-12-06 LAB — CBC
Hematocrit: 34.1 % (ref 34.0–46.6)
Hemoglobin: 10.7 g/dL — ABNORMAL LOW (ref 11.1–15.9)
MCH: 28.5 pg (ref 26.6–33.0)
MCHC: 31.4 g/dL — ABNORMAL LOW (ref 31.5–35.7)
MCV: 91 fL (ref 79–97)
Platelets: 284 10*3/uL (ref 150–450)
RBC: 3.75 x10E6/uL — ABNORMAL LOW (ref 3.77–5.28)
RDW: 13.1 % (ref 11.7–15.4)
WBC: 6.9 10*3/uL (ref 3.4–10.8)

## 2023-12-09 ENCOUNTER — Telehealth (HOSPITAL_COMMUNITY): Payer: Self-pay | Admitting: *Deleted

## 2023-12-09 ENCOUNTER — Ambulatory Visit (HOSPITAL_COMMUNITY): Payer: Self-pay | Admitting: Physician Assistant

## 2023-12-09 LAB — CBC
Hematocrit: 31.7 % — ABNORMAL LOW (ref 34.0–46.6)
Hemoglobin: 10 g/dL — ABNORMAL LOW (ref 11.1–15.9)
MCH: 28.4 pg (ref 26.6–33.0)
MCHC: 31.5 g/dL (ref 31.5–35.7)
MCV: 90 fL (ref 79–97)
Platelets: 281 x10E3/uL (ref 150–450)
RBC: 3.52 x10E6/uL — ABNORMAL LOW (ref 3.77–5.28)
RDW: 13.3 % (ref 11.7–15.4)
WBC: 7.9 x10E3/uL (ref 3.4–10.8)

## 2023-12-09 NOTE — Telephone Encounter (Signed)
 Called pt at the request of Daril Kicks PA to discuss abnormal lab and to cancel DCCV for 9/15. Discussed with pt Hgb trending down and needs to f/u with PCP to discuss before proceeding with DCCV. Pt agrees and will call her PCP Monday.

## 2023-12-12 ENCOUNTER — Ambulatory Visit (HOSPITAL_COMMUNITY): Admission: RE | Admit: 2023-12-12 | Source: Home / Self Care | Admitting: Cardiovascular Disease

## 2023-12-12 ENCOUNTER — Encounter (HOSPITAL_COMMUNITY): Admission: RE | Payer: Self-pay | Source: Home / Self Care

## 2023-12-12 SURGERY — CARDIOVERSION (CATH LAB)
Anesthesia: Monitor Anesthesia Care

## 2023-12-12 NOTE — Progress Notes (Signed)
 Remote Loop Recorder Transmission

## 2023-12-15 ENCOUNTER — Other Ambulatory Visit (HOSPITAL_COMMUNITY): Payer: Self-pay

## 2023-12-15 ENCOUNTER — Other Ambulatory Visit: Payer: Self-pay

## 2023-12-20 ENCOUNTER — Other Ambulatory Visit (HOSPITAL_COMMUNITY): Payer: Self-pay

## 2023-12-20 ENCOUNTER — Ambulatory Visit: Admitting: Pharmacist

## 2023-12-20 ENCOUNTER — Other Ambulatory Visit: Payer: Self-pay

## 2023-12-26 ENCOUNTER — Other Ambulatory Visit: Payer: Self-pay

## 2023-12-26 ENCOUNTER — Other Ambulatory Visit (HOSPITAL_COMMUNITY): Payer: Self-pay

## 2023-12-26 ENCOUNTER — Other Ambulatory Visit (HOSPITAL_COMMUNITY): Payer: Self-pay | Admitting: Physician Assistant

## 2023-12-26 MED ORDER — APIXABAN 5 MG PO TABS
5.0000 mg | ORAL_TABLET | Freq: Two times a day (BID) | ORAL | 11 refills | Status: AC
  Filled 2023-12-26: qty 60, 30d supply, fill #0
  Filled 2024-01-30: qty 60, 30d supply, fill #1
  Filled 2024-02-24: qty 60, 30d supply, fill #2
  Filled 2024-03-29: qty 60, 30d supply, fill #3
  Filled 2024-05-03: qty 60, 30d supply, fill #4

## 2023-12-26 NOTE — Progress Notes (Signed)
 Remote Loop Recorder Transmission

## 2023-12-27 ENCOUNTER — Ambulatory Visit (HOSPITAL_COMMUNITY): Admitting: Physician Assistant

## 2023-12-30 NOTE — Progress Notes (Signed)
 Carelink Summary Report / Loop Recorder

## 2024-01-02 ENCOUNTER — Ambulatory Visit

## 2024-01-02 ENCOUNTER — Encounter

## 2024-01-02 DIAGNOSIS — I4819 Other persistent atrial fibrillation: Secondary | ICD-10-CM

## 2024-01-02 LAB — CUP PACEART REMOTE DEVICE CHECK
Date Time Interrogation Session: 20251005231053
Implantable Pulse Generator Implant Date: 20220307

## 2024-01-03 NOTE — Progress Notes (Signed)
 Remote Loop Recorder Transmission

## 2024-01-04 ENCOUNTER — Ambulatory Visit: Payer: Self-pay | Admitting: Cardiology

## 2024-01-06 ENCOUNTER — Other Ambulatory Visit: Payer: Self-pay

## 2024-01-11 ENCOUNTER — Ambulatory Visit (HOSPITAL_COMMUNITY)
Admission: RE | Admit: 2024-01-11 | Discharge: 2024-01-11 | Disposition: A | Discharge status: Home / Self Care | Source: Ambulatory Visit | Attending: Physician Assistant | Admitting: Physician Assistant

## 2024-01-11 VITALS — BP 150/80 | HR 75 | Ht 66.0 in | Wt 228.4 lb

## 2024-01-11 DIAGNOSIS — I4891 Unspecified atrial fibrillation: Secondary | ICD-10-CM | POA: Diagnosis not present

## 2024-01-11 DIAGNOSIS — D6869 Other thrombophilia: Secondary | ICD-10-CM | POA: Diagnosis not present

## 2024-01-11 DIAGNOSIS — I4819 Other persistent atrial fibrillation: Secondary | ICD-10-CM

## 2024-01-11 NOTE — Progress Notes (Signed)
 Primary Care Physician: Ilah Crigler, MD Primary Cardiologist: none Primary Electrophysiologist: Dr Cindie Referring Physician: Dr Kelsie Dorthea [name] is a 63 y.o. female with a history of DM, HLD, HTN, renal cancer s/p right renal cryoablation, TIA, atrial fibrillation who presents for follow up in the [facility].  The patient was initially diagnosed with atrial fibrillation 06/07/21 on her ILR which was placed 05/2020 for TIA. She was seen by Dr Cindie and underwent afib ablation on 11/04/23.   Patient returns for follow up for atrial fibrillation. She is fortunately in SR today. Her ILR shows no afib since her last visit. She also followed up with her PCP regarding the anemia and her hgb had improved to >11. She does not chest tightness when she walks a long distance or takes her dog out for a walk. No other associated symptoms.   Today, she  denies symptoms of palpitations, shortness of breath, orthopnea, PND, lower extremity edema, dizziness, presyncope, syncope, snoring, daytime somnolence, bleeding, or neurologic sequela. The patient is tolerating medications without difficulties and is otherwise without complaint today.    Atrial Fibrillation Risk Factors:  she does have symptoms or diagnosis of sleep apnea. she does not have a history of rheumatic fever. she does not have a history of alcohol use. The patient does not have a history of early familial atrial fibrillation or other arrhythmias.   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    Asthma    due to allergies    Cancer (HCC)    THRYOID 2016 [place] WITH SURGERY, KIDNEY CANCER MAY 2018  RIGHT RENAL CRYOABLATION   Complication of anesthesia    Diabetes mellitus    TYPE 2    Family history of adverse reaction to anesthesia    difficult for father to wake after surgery, AFTHER LOW BP WITH ANESTHESIA   HOH (hard of hearing)    Hypercholesteremia    Hypertension    PONV (postoperative nausea and vomiting)    Renal cancer, right (HCC) dx'd 07/2018, 10/2018   ablation x 2    Thyroid  disease    TIA (transient ischemic attack)    LAST TIA  APRIL 2020 ON PLAVIX , reports residual effects ,02/2020    Current Outpatient Medications  Medication Sig Dispense Refill   acetaminophen  (TYLENOL ) 500 MG tablet Take 2 tablets (1,000 mg total) by mouth every 6 (six) hours as needed (body aches). (Patient taking differently: Take 1,000 mg by mouth as needed (body aches).) 30 tablet 0   albuterol  (VENTOLIN  HFA) 108 (90 Base) MCG/ACT inhaler Inhale 2 puffs every 4 to 6 hours as needed. 18 g 2   apixaban  (ELIQUIS ) 5 MG TABS tablet Take 1 tablet (5 mg total) by mouth 2 (two) times daily. 60 tablet 11   atorvastatin  (LIPITOR) 80 MG tablet Take 1 tablet (80 mg total) by mouth at bedtime. 90 tablet 3   Azelastine  HCl 137 MCG/SPRAY SOLN Place 1 spray into the nose 2 (two) times daily. (Patient taking differently: Place 1 spray into the nose as needed.) 30 mL 0   CALCIUM -MAGNESIUM -ZINC PO Take 1 tablet by mouth daily.     Continuous Glucose Sensor (FREESTYLE LIBRE 3 SENSOR) MISC Change sensor every two weeks 6 each 3  diclofenac  Sodium (VOLTAREN ) 1 % GEL Apply to affected area(s) twice daily as directed 100 g 0   diphenhydrAMINE  (BENADRYL ) 25 MG tablet Take 25 mg by mouth at bedtime.     ibuprofen  (ADVIL ) 200 MG tablet Take 800 mg by mouth every 8 (eight) hours as needed for headache or moderate pain. (Patient taking differently: Take 800 mg by mouth as needed for headache or moderate pain (pain score 4-6).)     levothyroxine  (SYNTHROID ) 150 MCG tablet Take 1 tablet (150 mcg total) by mouth daily. 90 tablet 3   melatonin 3 MG TABS tablet Take 3 mg by mouth at bedtime.     metFORMIN   (GLUCOPHAGE ) 500 MG tablet Take 1 tablet (500 mg total) by mouth 2 (two) times daily with a meal. 60 tablet 4   metoprolol  tartrate (LOPRESSOR ) 100 MG tablet Take 1 tablet (100 mg total) by mouth 2 (two) times daily. 60 tablet 5   pantoprazole  (PROTONIX ) 40 MG tablet Take 1 tablet (40 mg total) by mouth daily. 90 tablet 1   Probiotic Product (PROBIOTIC PO) Take 1 tablet by mouth daily.     Semaglutide , 1 MG/DOSE, (OZEMPIC , 1 MG/DOSE,) 4 MG/3ML SOPN Inject 1 mg into the skin once a week. 9 mL 2   spironolactone  (ALDACTONE ) 25 MG tablet Take 0.5 tablets (12.5 mg total) by mouth daily.     zinc gluconate 50 MG tablet Take 50 mg by mouth daily as needed (flu season). (Patient taking differently: Take 50 mg by mouth as needed (flu season).)     No current facility-administered medications for this encounter.    ROS- All systems are reviewed and negative except as per the HPI above.  Physical Exam: Vitals:   01/11/24 1059  BP: (!) 150/80  Pulse: 75  Weight: 103.6 kg  Height: 5' 6 (1.676 m)    GEN: Well nourished, well developed in no acute distress CARDIAC: Regular rate and rhythm, no murmurs, rubs, gallops RESPIRATORY:  Clear to auscultation without rales, wheezing or rhonchi  ABDOMEN: Soft, non-tender, non-distended EXTREMITIES:  No edema; No deformity    Wt Readings from Last 3 Encounters:  01/11/24 103.6 kg  12/05/23 105.3 kg  11/04/23 106.1 kg    EKG today demonstrates  SR, LAFB Vent. rate 75 BPM PR interval 146 ms QRS duration 98 ms QT/QTcB 368/410 ms   Echo 10/16/20 demonstrated   1. Left ventricular ejection fraction, by estimation, is 60 to 65%. The  left ventricle has normal function. The left ventricle has no regional  wall motion abnormalities. There is mild concentric left ventricular  hypertrophy. Left ventricular diastolic parameters are indeterminate but suggestive of at elevated left atrial pressure.   2. Right ventricular systolic function is normal. The  right ventricular  size is normal. Tricuspid regurgitation signal is inadequate for assessing PA pressure.   3. The mitral valve is normal in structure. No evidence of mitral valve regurgitation.   4. The aortic valve is tricuspid. There is mild thickening of the aortic  valve. Aortic valve regurgitation is not visualized.   5. The inferior vena cava is normal in size with greater than 50%  respiratory variability, suggesting right atrial pressure of 3 mmHg.   Comparison(s): A prior study was performed on 03/10/2020. No significant change from prior study. Prior images reviewed side by side.   Epic records are reviewed at length today   CHA2DS2-VASc Score = 6  The patient's score is based upon: CHF History: 1 HTN History: 1 Diabetes  History: 1 Stroke History: 2 Vascular Disease History: 0 Age Score: 0 Gender Score: 1       ASSESSMENT AND PLAN: Persistent Atrial Fibrillation/atrial flutter (ICD10:  I48.19) The patient's CHA2DS2-VASc score is 6, indicating a 9.7% annual risk of stroke.   S/p afib ablation 11/04/23 Patient maintaining SR, ILR shows no interim afib. Continue Eliquis  5 mg BID Continue Lopressor  100 mg BID  Secondary Hypercoagulable State (ICD10:  D68.69) The patient is at significant risk for stroke/thromboembolism based upon her CHA2DS2-VASc Score of 6.  Continue Apixaban  (Eliquis ). No bleeding issues.   Obesity Body mass index is 36.86 kg/m.  Encouraged lifestyle modification  HTN Stable on current regimen  CAD CAC score 1665 She does report episodes of chest tightness when walking longer distances. She has a visit to establish care with Dr Michele on 10/17. Will defer decision regarding ischemic evaluation.    Follow up with Dr Michele and and Jodie Passey as scheduled.    Daril Kicks PA-C [facility] [address] [place], [place] [address] [phone] 01/11/2024 11:23 AM

## 2024-01-13 ENCOUNTER — Other Ambulatory Visit (HOSPITAL_COMMUNITY): Payer: Self-pay

## 2024-01-13 ENCOUNTER — Encounter: Payer: Self-pay | Admitting: Cardiology

## 2024-01-13 ENCOUNTER — Ambulatory Visit: Attending: Cardiology | Admitting: Cardiology

## 2024-01-13 VITALS — BP 132/84 | HR 83 | Ht 66.0 in | Wt 228.0 lb

## 2024-01-13 DIAGNOSIS — R0609 Other forms of dyspnea: Secondary | ICD-10-CM | POA: Diagnosis not present

## 2024-01-13 DIAGNOSIS — R0683 Snoring: Secondary | ICD-10-CM

## 2024-01-13 DIAGNOSIS — I5032 Chronic diastolic (congestive) heart failure: Secondary | ICD-10-CM

## 2024-01-13 DIAGNOSIS — I2584 Coronary atherosclerosis due to calcified coronary lesion: Secondary | ICD-10-CM | POA: Diagnosis not present

## 2024-01-13 DIAGNOSIS — E7800 Pure hypercholesterolemia, unspecified: Secondary | ICD-10-CM

## 2024-01-13 DIAGNOSIS — Z8679 Personal history of other diseases of the circulatory system: Secondary | ICD-10-CM

## 2024-01-13 DIAGNOSIS — I4819 Other persistent atrial fibrillation: Secondary | ICD-10-CM

## 2024-01-13 DIAGNOSIS — R072 Precordial pain: Secondary | ICD-10-CM

## 2024-01-13 DIAGNOSIS — I10 Essential (primary) hypertension: Secondary | ICD-10-CM

## 2024-01-13 DIAGNOSIS — Z9889 Other specified postprocedural states: Secondary | ICD-10-CM

## 2024-01-13 DIAGNOSIS — Z7901 Long term (current) use of anticoagulants: Secondary | ICD-10-CM

## 2024-01-13 DIAGNOSIS — E119 Type 2 diabetes mellitus without complications: Secondary | ICD-10-CM

## 2024-01-13 DIAGNOSIS — G459 Transient cerebral ischemic attack, unspecified: Secondary | ICD-10-CM

## 2024-01-13 MED ORDER — NITROGLYCERIN 0.4 MG SL SUBL
0.4000 mg | SUBLINGUAL_TABLET | SUBLINGUAL | 3 refills | Status: AC | PRN
  Filled 2024-01-13: qty 25, 8d supply, fill #0
  Filled 2024-02-28: qty 25, 8d supply, fill #1
  Filled 2024-04-26: qty 25, 8d supply, fill #2

## 2024-01-13 MED ORDER — ISOSORBIDE DINITRATE 10 MG PO TABS
10.0000 mg | ORAL_TABLET | Freq: Two times a day (BID) | ORAL | 6 refills | Status: DC
  Filled 2024-01-13: qty 60, 30d supply, fill #0
  Filled 2024-02-07: qty 60, 30d supply, fill #1

## 2024-01-13 NOTE — Progress Notes (Signed)
 Cardiology Office Note:  .   Date:  01/13/2024  ID:  [name], DOB 1960/10/20, MRN [id number] PCP:  Ilah Crigler, MD  Former Cardiology Providers: Dr. Ronal Ross [facility] Providers Cardiologist:  Madonna Large, DO , [facility] (established care 01/13/24)  Cardiology APP:  Rana Lum CROME, NP  Electrophysiologist:  OLE ONEIDA HOLTS, MD  Electrophysiologist:  OLE ONEIDA HOLTS, MD  Click to update primary MD,subspecialty MD or APP then REFRESH:1}    Chief Complaint  Patient presents with   Follow-up    Chest pain or shortness of breath    History of Present Illness: .   [name] is a 63 y.o. Caucasian female whose past medical history and cardiovascular risk factors includes: Severe coronary artery calcification, persistent atrial fibrillation status post ablation, hypertension, hyperlipidemia, diabetes, renal cancer status post right renal cryoablation, TIA, thyroid  cancer status post total thyroidectomy in 2016 on Synthroid ,   Formally under the care of Dr. Ronal Ross who last saw [name] back in January 2024. I am seeing her for the first time to re-establishing care.   Given her history of TIA she had an ILR placed to monitor for A-fib.  She was initially diagnosed with atrial fibrillation in March 2023 by her ILR.  She was started on medical therapy as well as anticoagulation for thromboembolic prophylaxis.  Later that month in March 2025 she was reported to have increased burden of A-fib and was referred to A-fib clinic for further evaluation and follow-up.  During her A-fib clinic appointment she endorsed not being on pharmacological therapy for long-term and therefore was referred to Dr. HOLTS to consider for atrial fibrillation ablation.  During the workup she had a pulmonary vein study which noted a total CAC of 1665 placing her at the 99th percentile.  She did undergo atrial fibrillation ablation on 11/04/2023 with Dr. HOLTS.  She is here to  reestablish care.  Patient is endorsing chest tightness and shortness of breath.  Chest pain/tightness: Ongoing for the last 4 to 5 months. Increasing in frequency. Worse with effort related activities-walking her dogs.  Improves with resting and relaxing. Substernally located. Intensity 7 out of 10. Nonradiating. When asked why she has not seek medical attention sooner, no explanation. No ER visits for more expedited evaluation. Associated symptoms: Shortness of breath with effort related activities. Denies heart failure symptoms  Patient had plans to be evaluated for sleep apnea but this is still pending.  Review of Systems: .   Review of Systems  Cardiovascular:  Positive for chest pain, dyspnea on exertion and leg swelling. Negative for claudication, irregular heartbeat, near-syncope, orthopnea, palpitations, paroxysmal nocturnal dyspnea and syncope.  Respiratory:  Positive for snoring. Negative for shortness of breath.   Hematologic/Lymphatic: Negative for bleeding problem.    Studies Reviewed:   Atrial fibrillation ablation: Date 11/04/2023 1. Successful PVI 2. Successful ablation/isolation of the posterior wall 3. Intracardiac echo reveals trivial pericardial effusion, normal LA architecture 4. No early apparent complications. 5. Colchicine  0.6mg  PO BID x 5 days   EKG: 01/11/2024: Sinus rhythm, 75 bpm, consider old anteroseptal/inferior infarct age undetermined  Echocardiogram: 09/2020 LVEF 60 to 65%. Indeterminate diastolic function, elevated left atrial pressure, right ventricular size and function normal. No significant valvular heart disease.  CT cardiac morphology pulmonary vein 10/14/2023 1. There is normal pulmonary vein drainage into the left atrium with ostial measurements above.   2. There is no thrombus in the left atrial appendage.   3. The  esophagus runs in the left atrial midline and is not in proximity to any of the pulmonary vein ostia.   4. No  PFO/ASD.   5. Normal coronary origin. Right dominance.   6. CAC score of 1665 which is 99 percentile for age-, race-, and sex-matched controls.  RADIOLOGY: N/A  Risk Assessment/Calculations:   Click Here to Calculate/Change CHADS2VASc Score The patient's CHADS2-VASc score is 6, indicating a 9.7% annual risk of stroke.  CHF History: Yes HTN History: Yes Diabetes History: Yes Stroke History: Yes Vascular Disease History: No    Labs:       Latest Ref Rng & Units 12/08/2023    2:25 PM 12/05/2023   12:40 PM 10/09/2023    6:57 AM  CBC  WBC 3.4 - 10.8 x10E3/uL 7.9  6.9  8.0   Hemoglobin 11.1 - 15.9 g/dL 89.9  89.2  88.5   Hematocrit 34.0 - 46.6 % 31.7  34.1  35.2   Platelets 150 - 450 x10E3/uL 281  284  315        Latest Ref Rng & Units 12/05/2023   12:40 PM 10/25/2023   10:17 AM 10/14/2023   12:22 PM  BMP  Glucose 70 - 99 mg/dL 849  784  846   BUN 8 - 27 mg/dL 11  20  20    Creatinine 0.57 - 1.00 mg/dL 8.89  8.76  8.94   BUN/Creat Ratio 12 - 28 10  16  19    Sodium 134 - 144 mmol/L 142  131  134   Potassium 3.5 - 5.2 mmol/L 4.1  5.0  4.7   Chloride 96 - 106 mmol/L 109  100  102   CO2 20 - 29 mmol/L 17  19  18    Calcium  8.7 - 10.3 mg/dL 8.3  9.1  8.6       Latest Ref Rng & Units 12/05/2023   12:40 PM 10/25/2023   10:17 AM 10/14/2023   12:22 PM  CMP  Glucose 70 - 99 mg/dL 849  784  846   BUN 8 - 27 mg/dL 11  20  20    Creatinine 0.57 - 1.00 mg/dL 8.89  8.76  8.94   Sodium 134 - 144 mmol/L 142  131  134   Potassium 3.5 - 5.2 mmol/L 4.1  5.0  4.7   Chloride 96 - 106 mmol/L 109  100  102   CO2 20 - 29 mmol/L 17  19  18    Calcium  8.7 - 10.3 mg/dL 8.3  9.1  8.6     Lab Results  Component Value Date   CHOL 129 04/22/2022   HDL 44 04/22/2022   LDLCALC 54 04/22/2022   LDLDIRECT 39 06/22/2023   TRIG 191 (H) 04/22/2022   CHOLHDL 2.9 04/22/2022   No results for input(s): LIPOA in the last 8760 hours. No components found for: NTPROBNP No results for input(s): PROBNP in  the last 8760 hours. No results for input(s): TSH in the last 8760 hours.  Physical Exam:    Today's Vitals   01/13/24 1526  BP: 132/84  Pulse: 83  SpO2: 98%  Weight: 228 lb (103.4 kg)  Height: 5' 6 (1.676 m)   Body mass index is 36.8 kg/m. Wt Readings from Last 3 Encounters:  01/13/24 228 lb (103.4 kg)  01/11/24 228 lb 6.4 oz (103.6 kg)  12/05/23 232 lb 3.2 oz (105.3 kg)    Physical Exam  Constitutional: No distress.  hemodynamically stable  HENT:  Poor dentition  Neck: No JVD present.  Cardiovascular: Normal rate, regular rhythm, S1 normal and S2 normal. Exam reveals no gallop, no S3 and no S4.  No murmur heard. Pulmonary/Chest: Effort normal and breath sounds normal. No stridor. She has no wheezes. She has no rales.  Musculoskeletal:        General: Edema (trace) present.     Cervical back: Neck supple.  Skin: Skin is warm.   Impression & Recommendation(s):  Impression:   ICD-10-CM   1. Precordial pain  R07.2 nitroGLYCERIN  (NITROSTAT ) 0.4 MG SL tablet    EKG 12-Lead    isosorbide dinitrate (ISORDIL) 10 MG tablet    NM PET CT CARDIAC PERFUSION MULTI W/ABSOLUTE BLOODFLOW    ECHOCARDIOGRAM COMPLETE    2. Dyspnea on exertion  R06.09 NM PET CT CARDIAC PERFUSION MULTI W/ABSOLUTE BLOODFLOW    ECHOCARDIOGRAM COMPLETE    3. Coronary atherosclerosis due to severely calcified coronary lesion  I25.84 nitroGLYCERIN  (NITROSTAT ) 0.4 MG SL tablet    isosorbide dinitrate (ISORDIL) 10 MG tablet    NM PET CT CARDIAC PERFUSION MULTI W/ABSOLUTE BLOODFLOW    ECHOCARDIOGRAM COMPLETE    4. Chronic diastolic heart failure (HCC)  P49.67 Ambulatory referral to Pulmonology    ECHOCARDIOGRAM COMPLETE    5. Persistent atrial fibrillation (HCC)  I48.19 Ambulatory referral to Pulmonology    6. S/P ablation of atrial fibrillation  Z98.890 Ambulatory referral to Pulmonology   Z86.79     7. Long term (current) use of anticoagulants  Z79.01     8. TIA (transient ischemic attack)  G45.9      9. Type 2 diabetes mellitus without complication, without long-term current use of insulin  (HCC)  E11.9     10. Pure hypercholesterolemia  E78.00     11. Essential hypertension  I10     12. Snoring  R06.83 Ambulatory referral to Pulmonology       Recommendation(s):  Precordial pain Dyspnea on exertion Coronary atherosclerosis due to severely calcified coronary lesion Symptoms are concerning for cardiac discomfort. CAC 1665, 99th percentile EKG is nonischemic. Echo will be ordered to evaluate for structural heart disease and left ventricular systolic function. Cardiac PET/CT to evaluate for reversible ischemia Currently on Lopressor  100 mg p.o. twice daily. Start isosorbide dinitrate 10 mg p.o. twice daily, uptitrate as needed. Start sublingual nitroglycerin  tablets to use on a as needed basis. Not on aspirin  as she is currently on Eliquis  for thromboembolic prophylaxis. Continue Lipitor 80 mg p.o. nightly Educated her on seeking medical attention sooner by going to the closest ER via EMS if the symptoms increase in intensity, frequency, duration, or has typical chest pain as discussed in the office.  Patient verbalized understanding.  Chronic diastolic heart failure (HCC) NYHA class II/III Continue spironolactone  12.5 mg p.o. daily; had an episode of hyperkalemia while on 25 mg p.o. daily  Jardiance  discontinued, secondary to yeast infection. Lasix  and ARB are listed as allergy. Based on Dr. Ronal Snook progress note to follow 04/22/2022 patient was on torsemide  40 mg p.o. daily. Continue metoprolol , spironolactone  Will focus on ischemic workup for now. Reemphasized importance of low-salt diet.  Persistent atrial fibrillation (HCC) S/P ablation of atrial fibrillation Long term (current) use of anticoagulants Rate atrial: Metoprolol . Rhythm control: N/A. Thromboembolic prophylaxis: Eliquis  Status post atrial fibrillation ablation August 2025 Risks, benefits,  alternatives to anticoagulation discussed Reemphasize importance of being evaluated for sleep apnea-remains pended despite multiple office visits.  Patient is requesting us  to arrange care.  Will refer her to pulmonary medicine for  sleep apnea evaluation  TIA (transient ischemic attack) Reemphasize importance of secondary prevention  Type 2 diabetes mellitus without complication, without long-term current use of insulin  (HCC) Reemphasized importance of glycemic control. Currently on Ozempic , atorvastatin  ARB listed as allergies Recommend a goal LDL <70 mg/dL and triglycerides <850 mg/dL.  Triglycerides are not at goal.  Pure hypercholesterolemia Continue atorvastatin  80 mg p.o. nightly Recommend checking LP(a).  Essential hypertension Office blood pressures are very well-controlled. Medications as discussed above  Snoring Pulmonary consult placed for sleep apnea evaluation, high probability  Total time spent: 47 minutes. Progress note from Dr. Alvan 04/22/2022 reviewed CT cardiac morphology results reviewed from 10/17/2023. Labs 04/22/2022 independently reviewed EKG 01/13/2024 independently reviewed Echocardiogram 09/2020 independently reviewed. Discussed management of cardiac chest pain and shortness of breath. Prescription drug management. Coordination of care. Recommended that she should go to the closest ER via EMS if she has worsening chest pain or shortness of breath as her symptoms are concerning for angina pectoris.  Orders Placed:  Orders Placed This Encounter  Procedures   NM PET CT CARDIAC PERFUSION MULTI W/ABSOLUTE BLOODFLOW    Dr.Sapphira Harjo wants to be done soon    Standing Status:   Future    Expected Date:   01/20/2024    Expiration Date:   01/12/2025    If indicated for the ordered procedure, I authorize the administration of a radiopharmaceutical per Radiology protocol:   Yes    Preferred Imaging Location:   [facility]   Ambulatory referral to  Pulmonology    Referral Priority:   Routine    Referral Type:   Consultation    Referral Reason:   Specialty Services Required    Requested Specialty:   Pulmonary Disease    Number of Visits Requested:   1   EKG 12-Lead   ECHOCARDIOGRAM COMPLETE    Standing Status:   Future    Expiration Date:   01/12/2025    Where [facility] this [facility] performed[facility]    Does the patient weigh less than or greater than 250 lbs?:   Patient weighs less than 250 lbs    Perflutren DEFINITY (image enhancing agent) should be administered unless hypersensitivity or allergy exist:   Administer Perflutren    Reason for exam-Echo:   Chest Pain  R07.9     Final Medication List:    Meds ordered this encounter  Medications   nitroGLYCERIN  (NITROSTAT ) 0.4 MG SL tablet    Sig: Place 1 tablet (0.4 mg total) under the tongue every 5 (five) minutes as needed for chest pain.    Dispense:  25 tablet    Refill:  3   isosorbide dinitrate (ISORDIL) 10 MG tablet    Sig: Take 1 tablet (10 mg total) by mouth 2 (two) times daily.    Dispense:  60 tablet    Refill:  6    There are no discontinued medications.   Current Outpatient Medications:    acetaminophen  (TYLENOL ) 500 MG tablet, Take 2 tablets (1,000 mg total) by mouth every 6 (six) hours as needed (body aches). (Patient taking differently: Take 1,000 mg by mouth as needed (body aches).), Disp: 30 tablet, Rfl: 0   albuterol  (VENTOLIN  HFA) 108 (90 Base) MCG/ACT inhaler, Inhale 2 puffs every 4 to 6 hours as needed., Disp: 18 g, Rfl: 2   apixaban  (ELIQUIS ) 5 MG TABS tablet, Take 1 tablet (5 mg total) by mouth 2 (two) times daily., Disp: 60 tablet, Rfl: 11  atorvastatin  (LIPITOR) 80 MG tablet, Take 1 tablet (80 mg total) by mouth at bedtime., Disp: 90 tablet, Rfl: 3   Azelastine  HCl 137 MCG/SPRAY SOLN, Place 1 spray into the nose 2 (two) times daily. (Patient taking differently: Place 1 spray into the nose as needed.), Disp: 30 mL, Rfl: 0    CALCIUM -MAGNESIUM -ZINC PO, Take 1 tablet by mouth daily., Disp: , Rfl:    Continuous Glucose Sensor (FREESTYLE LIBRE 3 SENSOR) MISC, Change sensor every two weeks, Disp: 6 each, Rfl: 3   diclofenac  Sodium (VOLTAREN ) 1 % GEL, Apply to affected area(s) twice daily as directed, Disp: 100 g, Rfl: 0   diphenhydrAMINE  (BENADRYL ) 25 MG tablet, Take 25 mg by mouth at bedtime., Disp: , Rfl:    ibuprofen  (ADVIL ) 200 MG tablet, Take 800 mg by mouth every 8 (eight) hours as needed for headache or moderate pain. (Patient taking differently: Take 800 mg by mouth as needed for headache or moderate pain (pain score 4-6).), Disp: , Rfl:    isosorbide dinitrate (ISORDIL) 10 MG tablet, Take 1 tablet (10 mg total) by mouth 2 (two) times daily., Disp: 60 tablet, Rfl: 6   levothyroxine  (SYNTHROID ) 150 MCG tablet, Take 1 tablet (150 mcg total) by mouth daily., Disp: 90 tablet, Rfl: 3   melatonin 3 MG TABS tablet, Take 3 mg by mouth at bedtime., Disp: , Rfl:    metFORMIN  (GLUCOPHAGE ) 500 MG tablet, Take 1 tablet (500 mg total) by mouth 2 (two) times daily with a meal., Disp: 60 tablet, Rfl: 4   metoprolol  tartrate (LOPRESSOR ) 100 MG tablet, Take 1 tablet (100 mg total) by mouth 2 (two) times daily., Disp: 60 tablet, Rfl: 5   nitroGLYCERIN  (NITROSTAT ) 0.4 MG SL tablet, Place 1 tablet (0.4 mg total) under the tongue every 5 (five) minutes as needed for chest pain., Disp: 25 tablet, Rfl: 3   pantoprazole  (PROTONIX ) 40 MG tablet, Take 1 tablet (40 mg total) by mouth daily., Disp: 90 tablet, Rfl: 1   Probiotic Product (PROBIOTIC PO), Take 1 tablet by mouth daily., Disp: , Rfl:    Semaglutide , 1 MG/DOSE, (OZEMPIC , 1 MG/DOSE,) 4 MG/3ML SOPN, Inject 1 mg into the skin once a week., Disp: 9 mL, Rfl: 2   spironolactone  (ALDACTONE ) 25 MG tablet, Take 0.5 tablets (12.5 mg total) by mouth daily., Disp: , Rfl:    zinc gluconate 50 MG tablet, Take 50 mg by mouth daily as needed (flu season). (Patient taking differently: Take 50 mg by mouth  as needed (flu season).), Disp: , Rfl:   Consent:   Informed Consent   Shared Decision Making/Informed Consent The risks [chest pain, shortness of breath, cardiac arrhythmias, dizziness, blood pressure fluctuations, myocardial infarction, stroke/transient ischemic attack, nausea, vomiting, allergic reaction, radiation exposure, metallic taste sensation and life-threatening complications (estimated to be 1 in 10,000)], benefits (risk stratification, diagnosing coronary artery disease, treatment guidance) and alternatives of a cardiac PET stress test were discussed in detail with Ms. Rodocker and she agrees to proceed.     Disposition:   6 weeks  Her questions and concerns were addressed to her satisfaction. She voices understanding of the recommendations provided during this encounter.    Signed, Madonna Michele HAS, [facility]  A Division of [facility] [address] [place], [place] [address]  01/13/2024 5:41 PM

## 2024-01-13 NOTE — Patient Instructions (Addendum)
 Medication Instructions:  Start Isordil 10 mg twice a day NTG under tongue as needed for chest pain  *If you need a refill on your cardiac medications before your next appointment, please call your pharmacy*  Lab Work: None ordered  Testing/Procedures: Cardiac Pet Scan will be scheduled at [facility] after approved by insurance   Follow instructions below    Pulmonary Sleep will call back with appointment   Echo  First available  No Prep  Follow-Up: At [facility], you and your health needs are our priority.  As part of our continuing mission to provide you with exceptional heart care, our providers are all part of one team.  This team includes your primary Cardiologist (physician) and Advanced Practice Providers or APPs (Physician Assistants and Nurse Practitioners) who all work together to provide you with the care you need, when you need it.  Your next appointment:  6 weeks    Provider:  Dr.Tolia        Please report to Radiology at the [facility] Main Entrance 30 minutes early for your test.  [address] [place], [place] [address]                         OR   Please report to Radiology at [facility] Main Entrance, medical mall, 30 mins prior to your test.  [address]  [place], [place]  How to Prepare for Your Cardiac PET/CT Stress Test:  Nothing to eat or drink, except water, 3 hours prior to arrival time.  NO caffeine/decaffeinated products, or chocolate 12 hours prior to arrival. (Please note decaffeinated beverages (teas/coffees) still contain caffeine).  If you have caffeine within 12 hours prior, the test will need to be rescheduled.  Medication instructions: Do not take nitrates (isosorbide mononitrate Isordil, Ranexa) the day before or day of test   Diabetic Preparation: If able to eat breakfast prior to 3 hour fasting, you may take all medications, including your insulin . Do not worry if you  miss your breakfast dose of insulin  - start at your next meal. If you do not eat prior to 3 hour fast-Hold all diabetes (oral and insulin ) medications. Patients who wear a continuous glucose monitor MUST remove the device prior to scanning.  You may take your remaining medications with water.  NO perfume, cologne or lotion on chest or abdomen area. FEMALES - Please avoid wearing dresses to this appointment.  Total time is 1 to 2 hours; you may want to bring reading material for the waiting time.  IF YOU THINK YOU MAY BE PREGNANT, OR ARE NURSING PLEASE INFORM THE TECHNOLOGIST.  In preparation for your appointment, medication and supplies will be purchased.  Appointment availability is limited, so if you need to cancel or reschedule, please call the Radiology Department Scheduler at [phone] 24 hours in advance to avoid a cancellation fee of $100.00  What to Expect When you Arrive:  Once you arrive and check in for your appointment, you will be taken to a preparation room within the Radiology Department.  A technologist or Nurse will obtain your medical history, verify that you are correctly prepped for the exam, and explain the procedure.  Afterwards, an IV will be started in your arm and electrodes will be placed on your skin for EKG monitoring during the stress portion of the exam. Then you will be escorted to the PET/CT scanner.  There, staff will get  you positioned on the scanner and obtain a blood pressure and EKG.  During the exam, you will continue to be connected to the EKG and blood pressure machines.  A small, safe amount of a radioactive tracer will be injected in your IV to obtain a series of pictures of your heart along with an injection of a stress agent.    After your Exam:  It is recommended that you eat a meal and drink a caffeinated beverage to counter act any effects of the stress agent.  Drink plenty of fluids for the remainder of the day and urinate frequently for the  first couple of hours after the exam.  Your doctor will inform you of your test results within 7-10 business days.  For more information and frequently asked questions, please visit our website: [email or web address]  For questions about your test or how to prepare for your test, please call: Cardiac Imaging Nurse Navigators Office: [phone]  We recommend signing up for the patient portal called MyChart.  Sign up information is provided on this After Visit Summary.  MyChart is used to connect with patients for Virtual Visits (Telemedicine).  Patients are able to view lab/test results, encounter notes, upcoming appointments, etc.  Non-urgent messages can be sent to your provider as well.   To learn more about what you can do with MyChart, go to [email or web address].

## 2024-01-16 ENCOUNTER — Other Ambulatory Visit: Payer: Self-pay

## 2024-01-16 ENCOUNTER — Encounter (HOSPITAL_COMMUNITY): Payer: Self-pay

## 2024-01-16 ENCOUNTER — Other Ambulatory Visit (HOSPITAL_COMMUNITY): Payer: Self-pay | Admitting: *Deleted

## 2024-01-17 ENCOUNTER — Telehealth (HOSPITAL_COMMUNITY): Payer: Self-pay | Admitting: Emergency Medicine

## 2024-01-17 NOTE — Telephone Encounter (Signed)
 Reaching out to patient to offer assistance regarding upcoming cardiac imaging study; pt verbalizes understanding of appt date/time, parking situation and where to check in, pre-test NPO status and medications ordered, and verified current allergies; name and call back number provided for further questions should they arise Rockwell Alexandria RN Navigator Cardiac Imaging [facility] [phone] office [phone] cell

## 2024-01-18 ENCOUNTER — Ambulatory Visit (HOSPITAL_COMMUNITY)
Admission: RE | Admit: 2024-01-18 | Discharge: 2024-01-18 | Disposition: A | Discharge status: Home / Self Care | Source: Ambulatory Visit | Attending: Cardiology | Admitting: Cardiology

## 2024-01-18 DIAGNOSIS — I2584 Coronary atherosclerosis due to calcified coronary lesion: Secondary | ICD-10-CM | POA: Diagnosis present

## 2024-01-18 DIAGNOSIS — R072 Precordial pain: Secondary | ICD-10-CM | POA: Insufficient documentation

## 2024-01-18 DIAGNOSIS — R0609 Other forms of dyspnea: Secondary | ICD-10-CM | POA: Diagnosis not present

## 2024-01-18 LAB — NM PET CT CARDIAC PERFUSION MULTI W/ABSOLUTE BLOODFLOW
MBFR: 1.74
Nuc Rest EF: 65 %
Nuc Stress EF: 66 %
Rest MBF: 1.25 ml/g/min
Rest Nuclear Isotope Dose: 26.8 mCi
ST Depression (mm): 0 mm
Stress MBF: 2.18 ml/g/min
Stress Nuclear Isotope Dose: 26.8 mCi
TID: 0.93

## 2024-01-18 MED ORDER — REGADENOSON 0.4 MG/5ML IV SOLN
0.4000 mg | Freq: Once | INTRAVENOUS | Status: AC
  Administered 2024-01-18: 0.4 mg via INTRAVENOUS

## 2024-01-18 MED ORDER — RUBIDIUM RB82 GENERATOR (RUBYFILL)
26.8400 | PACK | Freq: Once | INTRAVENOUS | Status: AC
  Administered 2024-01-18: 26.84 via INTRAVENOUS

## 2024-01-18 MED ORDER — REGADENOSON 0.4 MG/5ML IV SOLN
INTRAVENOUS | Status: AC
  Filled 2024-01-18: qty 5

## 2024-01-18 MED ORDER — RUBIDIUM RB82 GENERATOR (RUBYFILL)
26.8200 | PACK | Freq: Once | INTRAVENOUS | Status: AC
  Administered 2024-01-18: 26.82 via INTRAVENOUS

## 2024-01-21 ENCOUNTER — Ambulatory Visit: Payer: Self-pay | Admitting: Cardiology

## 2024-01-23 ENCOUNTER — Other Ambulatory Visit: Payer: Self-pay | Admitting: Emergency Medicine

## 2024-01-23 ENCOUNTER — Ambulatory Visit (HOSPITAL_COMMUNITY)

## 2024-01-23 ENCOUNTER — Other Ambulatory Visit (HOSPITAL_COMMUNITY): Payer: Self-pay

## 2024-01-24 ENCOUNTER — Other Ambulatory Visit (HOSPITAL_COMMUNITY): Payer: Self-pay

## 2024-01-24 ENCOUNTER — Other Ambulatory Visit: Payer: Self-pay

## 2024-01-24 MED ORDER — PANTOPRAZOLE SODIUM 40 MG PO TBEC
40.0000 mg | DELAYED_RELEASE_TABLET | Freq: Every day | ORAL | 1 refills | Status: AC
  Filled 2024-01-24: qty 90, 90d supply, fill #0
  Filled 2024-04-26: qty 90, 90d supply, fill #1

## 2024-01-25 ENCOUNTER — Other Ambulatory Visit: Payer: Self-pay

## 2024-01-25 ENCOUNTER — Other Ambulatory Visit (HOSPITAL_COMMUNITY): Payer: Self-pay

## 2024-01-25 MED ORDER — METOPROLOL TARTRATE 100 MG PO TABS
100.0000 mg | ORAL_TABLET | Freq: Two times a day (BID) | ORAL | 3 refills | Status: AC
  Filled 2024-01-25: qty 180, 90d supply, fill #0
  Filled 2024-04-20: qty 180, 90d supply, fill #1

## 2024-01-30 ENCOUNTER — Other Ambulatory Visit: Payer: Self-pay

## 2024-02-02 ENCOUNTER — Encounter

## 2024-02-02 ENCOUNTER — Ambulatory Visit

## 2024-02-02 DIAGNOSIS — I4819 Other persistent atrial fibrillation: Secondary | ICD-10-CM | POA: Diagnosis not present

## 2024-02-02 LAB — CUP PACEART REMOTE DEVICE CHECK
Date Time Interrogation Session: 20251105230736
Implantable Pulse Generator Implant Date: 20220307

## 2024-02-03 ENCOUNTER — Other Ambulatory Visit (HOSPITAL_COMMUNITY): Payer: Self-pay

## 2024-02-03 NOTE — Progress Notes (Signed)
 Remote Loop Recorder Transmission

## 2024-02-05 ENCOUNTER — Encounter (HOSPITAL_COMMUNITY): Payer: Self-pay | Admitting: *Deleted

## 2024-02-05 ENCOUNTER — Telehealth: Admitting: Physician Assistant

## 2024-02-05 ENCOUNTER — Other Ambulatory Visit: Payer: Self-pay

## 2024-02-05 ENCOUNTER — Emergency Department (HOSPITAL_COMMUNITY)
Admission: EM | Admit: 2024-02-05 | Discharge: 2024-02-05 | Disposition: A | Discharge status: Home / Self Care | Attending: Emergency Medicine | Admitting: Emergency Medicine

## 2024-02-05 ENCOUNTER — Emergency Department (HOSPITAL_COMMUNITY)

## 2024-02-05 DIAGNOSIS — I4891 Unspecified atrial fibrillation: Secondary | ICD-10-CM | POA: Insufficient documentation

## 2024-02-05 DIAGNOSIS — E119 Type 2 diabetes mellitus without complications: Secondary | ICD-10-CM | POA: Insufficient documentation

## 2024-02-05 DIAGNOSIS — R0789 Other chest pain: Secondary | ICD-10-CM | POA: Insufficient documentation

## 2024-02-05 DIAGNOSIS — R112 Nausea with vomiting, unspecified: Secondary | ICD-10-CM | POA: Insufficient documentation

## 2024-02-05 DIAGNOSIS — R7989 Other specified abnormal findings of blood chemistry: Secondary | ICD-10-CM | POA: Insufficient documentation

## 2024-02-05 DIAGNOSIS — I2510 Atherosclerotic heart disease of native coronary artery without angina pectoris: Secondary | ICD-10-CM | POA: Insufficient documentation

## 2024-02-05 DIAGNOSIS — Z7984 Long term (current) use of oral hypoglycemic drugs: Secondary | ICD-10-CM | POA: Insufficient documentation

## 2024-02-05 DIAGNOSIS — Z79899 Other long term (current) drug therapy: Secondary | ICD-10-CM | POA: Insufficient documentation

## 2024-02-05 DIAGNOSIS — Z8673 Personal history of transient ischemic attack (TIA), and cerebral infarction without residual deficits: Secondary | ICD-10-CM | POA: Diagnosis not present

## 2024-02-05 DIAGNOSIS — R197 Diarrhea, unspecified: Secondary | ICD-10-CM

## 2024-02-05 DIAGNOSIS — R079 Chest pain, unspecified: Secondary | ICD-10-CM

## 2024-02-05 DIAGNOSIS — Z85528 Personal history of other malignant neoplasm of kidney: Secondary | ICD-10-CM | POA: Insufficient documentation

## 2024-02-05 DIAGNOSIS — I10 Essential (primary) hypertension: Secondary | ICD-10-CM | POA: Diagnosis not present

## 2024-02-05 DIAGNOSIS — E890 Postprocedural hypothyroidism: Secondary | ICD-10-CM | POA: Insufficient documentation

## 2024-02-05 DIAGNOSIS — Z85850 Personal history of malignant neoplasm of thyroid: Secondary | ICD-10-CM | POA: Diagnosis not present

## 2024-02-05 DIAGNOSIS — E785 Hyperlipidemia, unspecified: Secondary | ICD-10-CM | POA: Diagnosis not present

## 2024-02-05 DIAGNOSIS — Z7901 Long term (current) use of anticoagulants: Secondary | ICD-10-CM | POA: Insufficient documentation

## 2024-02-05 DIAGNOSIS — J45909 Unspecified asthma, uncomplicated: Secondary | ICD-10-CM | POA: Insufficient documentation

## 2024-02-05 DIAGNOSIS — Z7985 Long-term (current) use of injectable non-insulin antidiabetic drugs: Secondary | ICD-10-CM | POA: Insufficient documentation

## 2024-02-05 DIAGNOSIS — R0602 Shortness of breath: Secondary | ICD-10-CM | POA: Diagnosis present

## 2024-02-05 DIAGNOSIS — Z79890 Hormone replacement therapy (postmenopausal): Secondary | ICD-10-CM | POA: Insufficient documentation

## 2024-02-05 LAB — TROPONIN I (HIGH SENSITIVITY)
Troponin I (High Sensitivity): 5 ng/L (ref <18)
Troponin I (High Sensitivity): 5 ng/L (ref <18)

## 2024-02-05 LAB — CBC
HCT: 38.9 % (ref 36.0–46.0)
Hemoglobin: 12 g/dL (ref 12.0–15.0)
MCH: 26.2 pg (ref 26.0–34.0)
MCHC: 30.8 g/dL (ref 30.0–36.0)
MCV: 84.9 fL (ref 80.0–100.0)
Platelets: 340 K/uL (ref 150–400)
RBC: 4.58 MIL/uL (ref 3.87–5.11)
RDW: 13.8 % (ref 11.5–15.5)
WBC: 8.7 K/uL (ref 4.0–10.5)
nRBC: 0 % (ref 0.0–0.2)

## 2024-02-05 LAB — BASIC METABOLIC PANEL WITH GFR
Anion gap: 13 (ref 5–15)
BUN: 15 mg/dL (ref 8–23)
CO2: 21 mmol/L — ABNORMAL LOW (ref 22–32)
Calcium: 8.4 mg/dL — ABNORMAL LOW (ref 8.9–10.3)
Chloride: 102 mmol/L (ref 98–111)
Creatinine, Ser: 1.13 mg/dL — ABNORMAL HIGH (ref 0.44–1.00)
GFR, Estimated: 55 mL/min — ABNORMAL LOW (ref >60)
Glucose, Bld: 190 mg/dL — ABNORMAL HIGH (ref 70–99)
Potassium: 4.4 mmol/L (ref 3.5–5.1)
Sodium: 136 mmol/L (ref 135–145)

## 2024-02-05 LAB — HEPATIC FUNCTION PANEL
ALT: 19 U/L (ref 0–44)
AST: 20 U/L (ref 15–41)
Albumin: 3.5 g/dL (ref 3.5–5.0)
Alkaline Phosphatase: 83 U/L (ref 38–126)
Bilirubin, Direct: 0.1 mg/dL (ref 0.0–0.2)
Indirect Bilirubin: 0.9 mg/dL (ref 0.3–0.9)
Total Bilirubin: 1 mg/dL (ref 0.0–1.2)
Total Protein: 7 g/dL (ref 6.5–8.1)

## 2024-02-05 LAB — LIPASE, BLOOD: Lipase: 29 U/L (ref 11–51)

## 2024-02-05 LAB — BRAIN NATRIURETIC PEPTIDE: B Natriuretic Peptide: 121 pg/mL — ABNORMAL HIGH (ref 0.0–100.0)

## 2024-02-05 MED ORDER — ONDANSETRON 4 MG PO TBDP
ORAL_TABLET | ORAL | 0 refills | Status: AC
Start: 2024-02-05 — End: ?

## 2024-02-05 MED ORDER — SODIUM CHLORIDE 0.9 % IV BOLUS
500.0000 mL | Freq: Once | INTRAVENOUS | Status: AC
  Administered 2024-02-05: 500 mL via INTRAVENOUS

## 2024-02-05 MED ORDER — ONDANSETRON 4 MG PO TBDP
8.0000 mg | ORAL_TABLET | Freq: Once | ORAL | Status: AC
  Administered 2024-02-05: 8 mg via ORAL
  Filled 2024-02-05: qty 2

## 2024-02-05 NOTE — ED Triage Notes (Signed)
 Pt states that around 0300 she was awoken from sleep with N/V/D. Pt denies abd pain, but endorses midsternal CP.

## 2024-02-05 NOTE — ED Provider Notes (Signed)
 [place] EMERGENCY DEPARTMENT AT [facility] Provider Note   CSN: [id number] Arrival date & time: 02/05/24  1659     Patient presents with: Emesis and Chest Pain   [name] is a 63 y.o. female.   Patient is a 63 year old female who presents with nausea vomiting diarrhea.  She has a history of coronary artery disease, atrial fibrillation status post ablation on Eliquis , hypertension, hyperlipidemia, diabetes, TIA, thyroid  cancer status post thyroidectomy, renal cancer status post cryoablation.  She said she woke up in the middle of the night about 3 AM with nausea vomiting and watery diarrhea.  Her emesis is nonbloody and nonbilious.  Her diarrhea is watery and nonbloody.  She has had multiple episodes throughout the day.  She denies any associate abdominal pain.  No fevers.  No urinary symptoms.  She was out at [employer] and thinks maybe she caught something from somebody.  She has not been on antibiotics recently.  She has been having some intermittent chest tightness associated with some shortness of breath.  She has had similar symptoms for several months.  She has recently seen her cardiologist who has an echocardiogram scheduled.  She was given a prescription for nitroglycerin  and Imdur but she has not been able to take this today due to the nausea and vomiting.       Prior to Admission medications   Medication Sig Start Date End Date Taking? Authorizing Provider  acetaminophen  (TYLENOL ) 500 MG tablet Take 2 tablets (1,000 mg total) by mouth every 6 (six) hours as needed (body aches). Patient taking differently: Take 1,000 mg by mouth as needed (body aches). 06/29/17  Yes Vann, Jessica U, DO  albuterol  (VENTOLIN  HFA) 108 (90 Base) MCG/ACT inhaler Inhale 2 puffs every 4 to 6 hours as needed. 12/22/20  Yes Ilah Crigler, MD  apixaban  (ELIQUIS ) 5 MG TABS tablet Take 1 tablet (5 mg total) by mouth 2 (two) times daily. 12/26/23  Yes Fenton, Clint R, PA   atorvastatin  (LIPITOR) 80 MG tablet Take 1 tablet (80 mg total) by mouth at bedtime. 07/06/23  Yes BranchRonal BRAVO, MD  Azelastine  HCl 137 MCG/SPRAY SOLN Place 1 spray into the nose 2 (two) times daily. Patient taking differently: Place 1 spray into both nostrils daily as needed (allergies). 08/27/23  Yes Fleming, Zelda W, NP  CALCIUM -MAGNESIUM -ZINC PO Take 2 tablets by mouth daily.   Yes [provider]  diclofenac  Sodium (VOLTAREN ) 1 % GEL Apply to affected area(s) twice daily as directed 10/16/20  Yes Gawaluck, Greylon, MD  diphenhydrAMINE  (BENADRYL ) 25 MG tablet Take 25 mg by mouth at bedtime.   Yes [provider]  ibuprofen  (ADVIL ) 200 MG tablet Take 800 mg by mouth every 8 (eight) hours as needed for headache or moderate pain. Patient taking differently: Take 800 mg by mouth as needed for headache or moderate pain (pain score 4-6).   Yes [provider]  isosorbide dinitrate (ISORDIL) 10 MG tablet Take 1 tablet (10 mg total) by mouth 2 (two) times daily. 01/13/24  Yes Tolia, Sunit, DO  levothyroxine  (SYNTHROID ) 150 MCG tablet Take 1 tablet (150 mcg total) by mouth daily. 07/22/23  Yes   melatonin 5 MG TABS Take 3 mg by mouth at bedtime.   Yes [provider]  metFORMIN  (GLUCOPHAGE ) 500 MG tablet Take 1 tablet (500 mg total) by mouth 2 (two) times daily with a meal. 10/18/23  Yes   metoprolol  tartrate (LOPRESSOR ) 100 MG tablet Take 1 tablet (  100 mg total) by mouth 2 (two) times daily. 01/25/24  Yes Tolia, Sunit, DO  nitroGLYCERIN  (NITROSTAT ) 0.4 MG SL tablet Place 1 tablet (0.4 mg total) under the tongue every 5 (five) minutes as needed for chest pain. 01/13/24 04/12/24 Yes Tolia, Sunit, DO  pantoprazole  (PROTONIX ) 40 MG tablet Take 1 tablet (40 mg total) by mouth daily. 01/24/24  Yes   Probiotic Product (PROBIOTIC PO) Take 1 tablet by mouth daily.   Yes [provider]  Semaglutide , 1 MG/DOSE, (OZEMPIC , 1 MG/DOSE,) 4 MG/3ML SOPN Inject 1 mg into the skin  once a week. 04/27/23  Yes   spironolactone  (ALDACTONE ) 25 MG tablet Take 0.5 tablets (12.5 mg total) by mouth daily. 10/09/23 12/07/26 Yes Clay Garnette POUR, MD  Continuous Glucose Sensor (FREESTYLE LIBRE 3 SENSOR) MISC Change sensor every two weeks 07/26/23       Allergies: Amoxicillin , Furosemide , Lorabid [loracarbef], Augmentin  [amoxicillin -pot clavulanate], Codeine, Gadolinium derivatives, Sulfa drugs cross reactors, Benicar [olmesartan medoxomil], and Other    Review of Systems  Constitutional:  Positive for fatigue. Negative for chills, diaphoresis and fever.  HENT:  Negative for congestion, rhinorrhea and sneezing.   Eyes: Negative.   Respiratory:  Positive for chest tightness and shortness of breath. Negative for cough.   Cardiovascular:  Negative for leg swelling.  Gastrointestinal:  Positive for diarrhea, nausea and vomiting. Negative for abdominal pain and blood in stool.  Genitourinary:  Negative for difficulty urinating, flank pain and frequency.  Musculoskeletal:  Negative for arthralgias and back pain.  Skin:  Negative for rash.  Neurological:  Negative for dizziness, speech difficulty, weakness, numbness and headaches.    Updated Vital Signs BP (!) 143/78   Pulse 96   Temp 98.5 F (36.9 C)   Resp 17   Ht 5' 6 (1.676 m)   Wt 103.4 kg   SpO2 96%   BMI 36.79 kg/m   Physical Exam Constitutional:      Appearance: She is well-developed.  HENT:     Head: Normocephalic and atraumatic.  Eyes:     Pupils: Pupils are equal, round, and reactive to light.  Cardiovascular:     Rate and Rhythm: Normal rate and regular rhythm.     Heart sounds: Normal heart sounds.  Pulmonary:     Effort: Pulmonary effort is normal. No respiratory distress.     Breath sounds: Normal breath sounds. No wheezing or rales.  Chest:     Chest wall: No tenderness.  Abdominal:     General: Bowel sounds are normal.     Palpations: Abdomen is soft.     Tenderness: There is no abdominal tenderness.  There is no guarding or rebound.  Musculoskeletal:        General: Normal range of motion.     Cervical back: Normal range of motion and neck supple.  Lymphadenopathy:     Cervical: No cervical adenopathy.  Skin:    General: Skin is warm and dry.     Findings: No rash.  Neurological:     Mental Status: She is alert and oriented to person, place, and time.     (all labs ordered are listed, but only abnormal results are displayed) Labs Reviewed  BASIC METABOLIC PANEL WITH GFR - Abnormal; Notable for the following components:      Result Value   CO2 21 (*)    Glucose, Bld 190 (*)    Creatinine, Ser 1.13 (*)    Calcium  8.4 (*)    GFR, Estimated 55 (*)  All other components within normal limits  BRAIN NATRIURETIC PEPTIDE - Abnormal; Notable for the following components:   B Natriuretic Peptide 121.0 (*)    All other components within normal limits  CBC  HEPATIC FUNCTION PANEL  LIPASE, BLOOD  TROPONIN I (HIGH SENSITIVITY)  TROPONIN I (HIGH SENSITIVITY)    EKG: EKG Interpretation Date/Time:  Sunday February 05 2024 17:28:50 EST Ventricular Rate:  109 PR Interval:  168 QRS Duration:  90 QT Interval:  324 QTC Calculation: 436 R Axis:   -68  Text Interpretation: Sinus tachycardia Left axis deviation Minimal voltage criteria for LVH, may be normal variant ( Cornell product ) Septal infarct , age undetermined Possible Lateral infarct , age undetermined Abnormal ECG When compared with ECG of 13-Jan-2024 16:13, PREVIOUS ECG IS PRESENT SINCE LAST TRACING HEART RATE HAS INCREASED Confirmed by Lenor Hollering [phone]) on 02/05/2024 6:03:20 PM  Radiology: [facility] Chest Portable 1 View Result Date: 02/05/2024 EXAM: 1 VIEW(S) XRAY OF THE CHEST 02/05/2024 06:12:00 PM COMPARISON: 1 / 8 / 23. CLINICAL HISTORY: vomiting chest pain today FINDINGS: LINES, TUBES AND DEVICES: EKG leads noted. Gastric lap band in place. Surgical clips at thoracic inlet. LUNGS AND PLEURA: No focal pulmonary opacity. No  pulmonary edema. No pleural effusion. No pneumothorax. HEART AND MEDIASTINUM: No acute abnormality of the cardiac and mediastinal silhouettes. BONES AND SOFT TISSUES: No acute osseous abnormality. IMPRESSION: 1. No acute cardiopulmonary process. Electronically signed by: Norman Gatlin MD 02/05/2024 06:50 PM EST RP Workstation: [id number]     Procedures   Medications Ordered in the ED  ondansetron  (ZOFRAN -ODT) disintegrating tablet 8 mg (8 mg Oral Given 02/05/24 1722)  sodium chloride  0.9 % bolus 500 mL (500 mLs Intravenous New Bag/Given 02/05/24 1830)                                    Medical Decision Making Amount and/or Complexity of Data Reviewed Labs: ordered.   This patient presents to the ED for concern of nausea vomiting diarrhea, this involves an extensive number of treatment options, and is a complaint that carries with it a high risk of complications and morbidity.  I considered the following differential and admission for this acute, potentially life threatening condition.  The differential diagnosis includes gastroenteritis, colitis, diverticulitis, bowel obstruction, C. difficile  MDM:    Patient is a 63 year old female who presents with nausea vomiting diarrhea.  She does not have any associated abdominal pain.  No current risk factors for C. difficile.  Labs reviewed and are nonconcerning.  Creatinine is similar to baseline values.  LFTs are normal.  No evidence of pancreatitis.  She has no abdominal tenderness on exam.  She is afebrile.  She was mildly tachycardic on arrival but her heart rate is improving with IV fluids.  She did have some intermittent chest discomfort but it sounds like she has been having this for a while and is currently being followed by cardiology.  She does not have any recent change in symptoms.  No ischemic changes on EKG.  Her troponins are negative.  Chest x-ray does not show any sign of pulmonary edema or other acute abnormality.  She is feeling  better after IV fluids and Zofran .  She is able to tolerate liquids without any ongoing nausea or vomiting.  Suspect she has a viral gastroenteritis.  She was discharged home in good condition.  She was given a prescription for Zofran .  She was advised to follow-up with her cardiologist if her chest pain continues.  She was vies to follow-up with her primary care doctor if her GI symptoms continue or return to the emergency room if she has any worsening symptoms.  (Labs, imaging, consults)  Labs: I Ordered, and personally interpreted labs.  The pertinent results include: Normal troponins, mildly elevated creatinine but similar to prior values  Imaging Studies ordered: I ordered imaging studies including chest x-ray I independently visualized and interpreted imaging. I agree with the radiologist interpretation  Additional history obtained from husband at bedside.  External records from outside source obtained and reviewed including history  Cardiac Monitoring: The patient was maintained on a cardiac monitor.  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    Asthma    due to allergies    Cancer (HCC)    THRYOID 2016 [place] WITH SURGERY, KIDNEY CANCER MAY 2018  RIGHT RENAL CRYOABLATION   Complication of anesthesia    Diabetes mellitus    TYPE 2   Family history of adverse reaction to anesthesia    difficult for father to wake after surgery, AFTHER LOW BP WITH ANESTHESIA   HOH (hard of hearing)    Hypercholesteremia    Hypertension    PONV (postoperative nausea and vomiting)     Renal cancer, right (HCC) dx'd 07/2018, 10/2018   ablation x 2    Thyroid  disease    TIA (transient ischemic attack)    LAST TIA  APRIL 2020 ON PLAVIX , reports residual effects ,02/2020     Medicines Meds ordered this encounter  Medications   ondansetron  (ZOFRAN -ODT) disintegrating tablet 8 mg   sodium chloride  0.9 % bolus 500 mL    I have reviewed the patients home medicines and have made adjustments as needed  Problem List / ED Course: Problem List Items Addressed This Visit   None            Final diagnoses:  None    ED Discharge Orders     None          Lenor Hollering, MD 02/05/24 2035

## 2024-02-05 NOTE — Progress Notes (Signed)
 Virtual Visit Consent   Lithzy K Snapp, you are scheduled for a virtual visit with a [facility] provider today. Just as with appointments in the office, your consent must be obtained to participate. Your consent will be active for this visit and any virtual visit you may have with one of our providers in the next 365 days. If you have a MyChart account, a copy of this consent can be sent to you electronically.  As this is a virtual visit, video technology does not allow for your provider to perform a traditional examination. This may limit your provider's ability to fully assess your condition. If your provider identifies any concerns that need to be evaluated in person or the need to arrange testing (such as labs, EKG, etc.), we will make arrangements to do so. Although advances in technology are sophisticated, we cannot ensure that it will always work on either your end or our end. If the connection with a video visit is poor, the visit may have to be switched to a telephone visit. With either a video or telephone visit, we are not always able to ensure that we have a secure connection.  By engaging in this virtual visit, you consent to the provision of healthcare and authorize for your insurance to be billed (if applicable) for the services provided during this visit. Depending on your insurance coverage, you may receive a charge related to this service.  I need to obtain your verbal consent now. Are you willing to proceed with your visit today? [name] has provided verbal consent on 02/05/2024 for a virtual visit (video or telephone). [name], [place]  Date: 02/05/2024 4:06 PM   Virtual Visit via Video Note   I, [name], connected with  JOCELYNNE DUQUETTE  ([id number], 05/04/1960) on 02/05/24 at  4:00 PM EST by a video-enabled telemedicine application and verified that I am speaking with the correct person using two identifiers.  Location: Patient: Virtual Visit Location Patient:  Home Provider: Virtual Visit Location Provider: Home Office   I discussed the limitations of evaluation and management by telemedicine and the availability of in person appointments. The patient expressed understanding and agreed to proceed.    History of Present Illness: [name] is a 63 y.o. who identifies as a female who was assigned female at birth, and is being seen today for nausea and vomiting, with diarrhea  HPI: Emesis  This is a new problem. The current episode started today. The problem occurs 5 to 10 times per day. The problem has been gradually worsening. There has been no fever. Associated symptoms include diarrhea. She has tried nothing for the symptoms. The treatment provided no relief.    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 05/16/2016   Fatty liver disease, 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/16/2016   Slurred speech 09/13/2015   Parotitis 09/13/2015   Gastroenteritis 08/21/2015   Hypothyroid 08/21/2015   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  Pain in the chest    Essential hypertension    Chest pain 04/23/2014   Anginal pain 04/23/2014   Type 2 diabetes mellitus (HCC)    Hypertension    Hypercholesteremia     Allergies:  Allergies  Allergen Reactions   Amoxicillin  Other (See Comments) and Anaphylaxis    Resulted in ambulance per pt   Furosemide  Anaphylaxis and Swelling     Patient states has reaction based on sulfa allergy   Lorabid [Loracarbef] Anaphylaxis   Augmentin  [Amoxicillin -Pot Clavulanate] Hives, Nausea And Vomiting and Other (See Comments)    Chest pain   Codeine Nausea And Vomiting   Gadolinium Derivatives Itching, Swelling and Other (See Comments)    Patient will require 13 hr prep prior to administration of gadolinium     Sulfa Drugs Cross Reactors Other (See Comments)    Severe allergic reaction as a child - was not told symptoms...    Benicar [Olmesartan Medoxomil] Swelling and Rash   Other Rash    Green peas   Medications:  Current Outpatient Medications:    acetaminophen  (TYLENOL ) 500 MG tablet, Take 2 tablets (1,000 mg total) by mouth every 6 (six) hours as needed (body aches). (Patient taking differently: Take 1,000 mg by mouth as needed (body aches).), Disp: 30 tablet, Rfl: 0   albuterol  (VENTOLIN  HFA) 108 (90 Base) MCG/ACT inhaler, Inhale 2 puffs every 4 to 6 hours as needed., Disp: 18 g, Rfl: 2   apixaban  (ELIQUIS ) 5 MG TABS tablet, Take 1 tablet (5 mg total) by mouth 2 (two) times daily., Disp: 60 tablet, Rfl: 11   atorvastatin  (LIPITOR) 80 MG tablet, Take 1 tablet (80 mg total) by mouth at bedtime., Disp: 90 tablet, Rfl: 3   Azelastine  HCl 137 MCG/SPRAY SOLN, Place 1 spray into the nose 2 (two) times daily. (Patient taking differently: Place 1 spray into the nose as needed.), Disp: 30 mL, Rfl: 0   CALCIUM -MAGNESIUM -ZINC PO, Take 1 tablet by mouth daily., Disp: , Rfl:    Continuous Glucose Sensor (FREESTYLE LIBRE 3 SENSOR) MISC, Change sensor every two weeks, Disp: 6 each, Rfl: 3   diclofenac  Sodium (VOLTAREN ) 1 % GEL, Apply to affected area(s) twice daily as directed, Disp: 100 g, Rfl: 0   diphenhydrAMINE  (BENADRYL ) 25 MG tablet, Take 25 mg by mouth at bedtime., Disp: , Rfl:    ibuprofen  (ADVIL ) 200 MG tablet, Take 800 mg by mouth every 8 (eight) hours as needed for headache or moderate pain. (Patient taking differently: Take 800 mg  by mouth as needed for headache or moderate pain (pain score 4-6).), Disp: , Rfl:    isosorbide dinitrate (ISORDIL) 10 MG tablet, Take 1 tablet (10 mg total) by mouth 2 (two) times daily., Disp: 60 tablet, Rfl: 6   levothyroxine  (SYNTHROID ) 150 MCG tablet, Take 1 tablet (150 mcg total) by mouth daily., Disp: 90 tablet, Rfl: 3   melatonin 3 MG TABS tablet, Take 3 mg by mouth at bedtime., Disp: , Rfl:    metFORMIN  (GLUCOPHAGE ) 500 MG tablet, Take 1 tablet (500 mg total) by mouth 2 (two) times daily with a meal., Disp: 60 tablet, Rfl: 4   metoprolol  tartrate (LOPRESSOR ) 100 MG tablet, Take 1 tablet (100 mg total) by mouth 2 (two) times daily., Disp: 180 tablet, Rfl: 3   nitroGLYCERIN  (NITROSTAT ) 0.4 MG SL tablet, Place 1 tablet (0.4 mg total) under the tongue every 5 (five) minutes as needed for chest pain., Disp: 25 tablet, Rfl: 3   pantoprazole  (PROTONIX ) 40 MG  tablet, Take 1 tablet (40 mg total) by mouth daily., Disp: 90 tablet, Rfl: 1   Probiotic Product (PROBIOTIC PO), Take 1 tablet by mouth daily., Disp: , Rfl:    Semaglutide , 1 MG/DOSE, (OZEMPIC , 1 MG/DOSE,) 4 MG/3ML SOPN, Inject 1 mg into the skin once a week., Disp: 9 mL, Rfl: 2   spironolactone  (ALDACTONE ) 25 MG tablet, Take 0.5 tablets (12.5 mg total) by mouth daily., Disp: , Rfl:    zinc gluconate 50 MG tablet, Take 50 mg by mouth daily as needed (flu season). (Patient taking differently: Take 50 mg by mouth as needed (flu season).), Disp: , Rfl:   Observations/Objective: Patient is well-developed, well-nourished in no acute distress.  Resting comfortably  at home.  Head is normocephalic, atraumatic.  No labored breathing.  Speech is clear and coherent with logical content.  Patient is alert and oriented at baseline.    Assessment and Plan: 1. Nausea and vomiting, unspecified vomiting type (Primary)  2. Diarrhea, unspecified type  Nausea and vomiting with diarrhea. Unable to take her medications or keep food down. Advised to  report to ER immediately. She agreed to this plan and is reporting to the ER.  Follow Up Instructions: I discussed the assessment and treatment plan with the patient. The patient was provided an opportunity to ask questions and all were answered. The patient agreed with the plan and demonstrated an understanding of the instructions.  A copy of instructions were sent to the patient via MyChart unless otherwise noted below.    The patient was advised to call back or seek an in-person evaluation if the symptoms worsen or if the condition fails to improve as anticipated.    Denise Shuck, PA-C

## 2024-02-05 NOTE — Discharge Instructions (Addendum)
 Follow-up with your primary care doctor if your symptoms are not improving in the next 24 hours.  Follow-up with your cardiologist if your chest pain continues.  Return to the emergency room if you have any worsening symptoms.

## 2024-02-05 NOTE — Patient Instructions (Signed)
 [name], thank you for joining [name], PA-C for today's virtual visit.  While this provider is not your primary care provider (PCP), if your PCP is located in our provider database this encounter information will be shared with them immediately following your visit.   A [facility] MyChart account gives you access to today's visit and all your visits, tests, and labs performed at [facility]  click here if you don't have a [facility] MyChart account or go to [email or web address]  Consent: (Patient) [name] provided verbal consent for this virtual visit at the beginning of the encounter.  Current Medications:  Current Outpatient Medications:    acetaminophen  (TYLENOL ) 500 MG tablet, Take 2 tablets (1,000 mg total) by mouth every 6 (six) hours as needed (body aches). (Patient taking differently: Take 1,000 mg by mouth as needed (body aches).), Disp: 30 tablet, Rfl: 0   albuterol  (VENTOLIN  HFA) 108 (90 Base) MCG/ACT inhaler, Inhale 2 puffs every 4 to 6 hours as needed., Disp: 18 g, Rfl: 2   apixaban  (ELIQUIS ) 5 MG TABS tablet, Take 1 tablet (5 mg total) by mouth 2 (two) times daily., Disp: 60 tablet, Rfl: 11   atorvastatin  (LIPITOR) 80 MG tablet, Take 1 tablet (80 mg total) by mouth at bedtime., Disp: 90 tablet, Rfl: 3   Azelastine  HCl 137 MCG/SPRAY SOLN, Place 1 spray into the nose 2 (two) times daily. (Patient taking differently: Place 1 spray into the nose as needed.), Disp: 30 mL, Rfl: 0   CALCIUM -MAGNESIUM -ZINC PO, Take 1 tablet by mouth daily., Disp: , Rfl:    Continuous Glucose Sensor (FREESTYLE LIBRE 3 SENSOR) MISC, Change sensor every two weeks, Disp: 6 each, Rfl: 3   diclofenac  Sodium (VOLTAREN ) 1 % GEL, Apply to affected area(s) twice daily as directed, Disp: 100 g, Rfl: 0   diphenhydrAMINE  (BENADRYL ) 25 MG tablet, Take 25 mg by mouth at bedtime., Disp: , Rfl:    ibuprofen  (ADVIL ) 200 MG tablet, Take 800 mg by mouth every 8 (eight) hours as  needed for headache or moderate pain. (Patient taking differently: Take 800 mg by mouth as needed for headache or moderate pain (pain score 4-6).), Disp: , Rfl:    isosorbide dinitrate (ISORDIL) 10 MG tablet, Take 1 tablet (10 mg total) by mouth 2 (two) times daily., Disp: 60 tablet, Rfl: 6   levothyroxine  (SYNTHROID ) 150 MCG tablet, Take 1 tablet (150 mcg total) by mouth daily., Disp: 90 tablet, Rfl: 3   melatonin 3 MG TABS tablet, Take 3 mg by mouth at bedtime., Disp: , Rfl:    metFORMIN  (GLUCOPHAGE ) 500 MG tablet, Take 1 tablet (500 mg total) by mouth 2 (two) times daily with a meal., Disp: 60 tablet, Rfl: 4   metoprolol  tartrate (LOPRESSOR ) 100 MG tablet, Take 1 tablet (100 mg total) by mouth 2 (two) times daily., Disp: 180 tablet, Rfl: 3   nitroGLYCERIN  (NITROSTAT ) 0.4 MG SL tablet, Place 1 tablet (0.4 mg total) under the tongue every 5 (five) minutes as needed for chest pain., Disp: 25 tablet, Rfl: 3   pantoprazole  (PROTONIX ) 40 MG tablet, Take 1 tablet (40 mg total) by mouth daily., Disp: 90 tablet, Rfl: 1   Probiotic Product (PROBIOTIC PO), Take 1 tablet by mouth daily., Disp: , Rfl:    Semaglutide , 1 MG/DOSE, (OZEMPIC , 1 MG/DOSE,) 4 MG/3ML SOPN, Inject 1 mg into the skin once a week., Disp: 9 mL, Rfl: 2   spironolactone  (ALDACTONE ) 25 MG tablet, Take 0.5 tablets (12.5 mg total) by  mouth daily., Disp: , Rfl:    zinc gluconate 50 MG tablet, Take 50 mg by mouth daily as needed (flu season). (Patient taking differently: Take 50 mg by mouth as needed (flu season).), Disp: , Rfl:    Medications ordered in this encounter:  No orders of the defined types were placed in this encounter.    *If you need refills on other medications prior to your next appointment, please contact your pharmacy*  Follow-Up: Call back or seek an in-person evaluation if the symptoms worsen or if the condition fails to improve as anticipated.  [facility] [phone]  Other Instructions Report to ER  for evaluation.    If you have been instructed to have an in-person evaluation today at a local Urgent Care facility, please use the link below. It will take you to a list of all of our available [facility], including address, phone number and hours of operation. Please do not delay care.  [facility]  If you or a family member do not have a primary care provider, use the link below to schedule a visit and establish care. When you choose a [facility] primary care physician or advanced practice provider, you gain a long-term partner in health. Find a Primary Care Provider  Learn more about [facility] in-office and virtual care options: [facility] - Get Care Now

## 2024-02-05 NOTE — ED Triage Notes (Signed)
 The pt has a loop recorder and hx of af  she has not been able to take her heart medicines today also

## 2024-02-06 ENCOUNTER — Ambulatory Visit: Payer: Self-pay | Admitting: Cardiology

## 2024-02-06 ENCOUNTER — Encounter: Admitting: Student

## 2024-02-07 ENCOUNTER — Other Ambulatory Visit (HOSPITAL_COMMUNITY): Payer: Self-pay

## 2024-02-07 MED ORDER — SEMAGLUTIDE (1 MG/DOSE) 4 MG/3ML ~~LOC~~ SOPN
1.0000 mg | PEN_INJECTOR | SUBCUTANEOUS | 2 refills | Status: AC
  Filled 2024-02-07 – 2024-02-09 (×2): qty 9, 84d supply, fill #0

## 2024-02-09 ENCOUNTER — Other Ambulatory Visit: Payer: Self-pay

## 2024-02-09 ENCOUNTER — Other Ambulatory Visit (HOSPITAL_COMMUNITY): Payer: Self-pay

## 2024-02-10 ENCOUNTER — Encounter: Payer: Self-pay | Admitting: Student

## 2024-02-10 ENCOUNTER — Other Ambulatory Visit (HOSPITAL_COMMUNITY): Payer: Self-pay

## 2024-02-10 ENCOUNTER — Ambulatory Visit: Attending: Student | Admitting: Student

## 2024-02-10 VITALS — BP 132/72 | HR 75 | Ht 66.0 in | Wt 229.6 lb

## 2024-02-10 DIAGNOSIS — I2584 Coronary atherosclerosis due to calcified coronary lesion: Secondary | ICD-10-CM

## 2024-02-10 DIAGNOSIS — Z9889 Other specified postprocedural states: Secondary | ICD-10-CM | POA: Diagnosis not present

## 2024-02-10 DIAGNOSIS — I4819 Other persistent atrial fibrillation: Secondary | ICD-10-CM | POA: Diagnosis not present

## 2024-02-10 DIAGNOSIS — Z7901 Long term (current) use of anticoagulants: Secondary | ICD-10-CM

## 2024-02-10 DIAGNOSIS — G459 Transient cerebral ischemic attack, unspecified: Secondary | ICD-10-CM

## 2024-02-10 DIAGNOSIS — I10 Essential (primary) hypertension: Secondary | ICD-10-CM

## 2024-02-10 DIAGNOSIS — Z8679 Personal history of other diseases of the circulatory system: Secondary | ICD-10-CM

## 2024-02-10 NOTE — Progress Notes (Signed)
   Electrophysiology Office Note:   Date:  02/10/2024  ID:  [name], DOB [birth year], MRN [id number]  Primary Cardiologist: Madonna Large, DO Electrophysiologist: OLE ONEIDA HOLTS, MD      History of Present Illness:   [name] is a 63 y.o. female with h/o DM, HLD, HTN, renal cancer s/p right renal cryoablation, TIA, atrial fibrillation  seen today for routine electrophysiology followup.   Since last being seen in our clinic the patient reports doing OK. Continues to have intermittent chest pressure, work up on-going. Otherwise, she denies palpitations, dyspnea, PND, orthopnea, nausea, vomiting, dizziness, syncope, edema, weight gain, or early satiety.   Review of systems complete and found to be negative unless listed in HPI.   Studies Reviewed:    EKG is ordered today. Personal review as below.  EKG Interpretation Date/Time:  Friday February 10 2024 11:33:20 EST Ventricular Rate:  75 PR Interval:    QRS Duration:  92 QT Interval:  372 QTC Calculation: 415 R Axis:   -76  Text Interpretation: Normal sinus rhythm Poor data quality Confirmed by Lesia Sharper [phone]) on 02/10/2024 11:46:10 AM    Arrhythmia/Device History MDT ILR 05/2020 for AF  S/p AF ablation 11/04/2023    Physical Exam:   VS:  BP 132/72   Pulse 75   Ht 5' 6 (1.676 m)   Wt 229 lb 9.6 oz (104.1 kg)   SpO2 98%   BMI 37.06 kg/m    Wt Readings from Last 3 Encounters:  02/10/24 229 lb 9.6 oz (104.1 kg)  02/05/24 227 lb 15.3 oz (103.4 kg)  01/13/24 228 lb (103.4 kg)     GEN: No acute distress NECK: No JVD; No carotid bruits CARDIAC: Regular rate and rhythm, no murmurs, rubs, gallops RESPIRATORY:  Clear to auscultation without rales, wheezing or rhonchi  ABDOMEN: Soft, non-tender, non-distended EXTREMITIES:  No edema; No deformity   ASSESSMENT AND PLAN:    Atrial fibrillation s/p [employer] Loop recorder Secondary hypercoagulable state S/p AF ablation 11/04/2023 Pt on Eliquis  for  [id number] of at least 6  No AF since beginning of September Normal device function by website No changes today  Obesity Body mass index is 37.06 kg/m.  Encouraged lifestyle modification  HTN Stable on current regimen   CAD PET 01/18/2024 without ischemia, but +microvascular disease Pending Echo 02/15/2024 Has intermittent chest pressure. No change or worsening.     Follow up with EP Team in 12 months  Signed, Sharper Prentice Lesia, PA-C

## 2024-02-10 NOTE — Patient Instructions (Signed)
 Medication Instructions:  No medication changes today. *If you need a refill on your cardiac medications before your next appointment, please call your pharmacy*  Lab Work: No labwork ordered today. If you have labs (blood work) drawn today and your tests are completely normal, you will receive your results only by: MyChart Message (if you have MyChart) OR A paper copy in the mail If you have any lab test that is abnormal or we need to change your treatment, we will call you to review the results.  Testing/Procedures: No testing ordered today  Follow-Up: At [facility], you and your health needs are our priority.  As part of our continuing mission to provide you with exceptional heart care, our providers are all part of one team.  This team includes your primary Cardiologist (physician) and Advanced Practice Providers or APPs (Physician Assistants and Nurse Practitioners) who all work together to provide you with the care you need, when you need it.  Your next appointment:   12 month(s)  Provider:   You may see Boyce Byes, MD or one of the following Advanced Practice Providers on your designated Care Team:   Mertha Abrahams, [place] Bambi Lever "Jonelle Neri" Willard, PA-C Suzann Riddle, NP Creighton Doffing, NP    We recommend signing up for the patient portal called "MyChart".  Sign up information is provided on this After Visit Summary.  MyChart is used to connect with patients for Virtual Visits (Telemedicine).  Patients are able to view lab/test results, encounter notes, upcoming appointments, etc.  Non-urgent messages can be sent to your provider as well.   To learn more about what you can do with MyChart, go to [email or web address].

## 2024-02-15 ENCOUNTER — Ambulatory Visit (HOSPITAL_COMMUNITY)
Admission: RE | Admit: 2024-02-15 | Discharge: 2024-02-15 | Disposition: A | Discharge status: Home / Self Care | Source: Ambulatory Visit | Attending: Cardiology | Admitting: Cardiology

## 2024-02-15 DIAGNOSIS — I5032 Chronic diastolic (congestive) heart failure: Secondary | ICD-10-CM | POA: Diagnosis present

## 2024-02-15 DIAGNOSIS — R0609 Other forms of dyspnea: Secondary | ICD-10-CM | POA: Insufficient documentation

## 2024-02-15 DIAGNOSIS — R072 Precordial pain: Secondary | ICD-10-CM | POA: Diagnosis present

## 2024-02-15 DIAGNOSIS — I2584 Coronary atherosclerosis due to calcified coronary lesion: Secondary | ICD-10-CM | POA: Insufficient documentation

## 2024-02-15 LAB — ECHOCARDIOGRAM COMPLETE
Area-P 1/2: 4.1 cm2
S' Lateral: 2.89 cm

## 2024-02-16 ENCOUNTER — Other Ambulatory Visit (HOSPITAL_COMMUNITY): Payer: Self-pay

## 2024-02-22 ENCOUNTER — Ambulatory Visit: Admitting: Adult Health

## 2024-02-24 ENCOUNTER — Other Ambulatory Visit (HOSPITAL_COMMUNITY): Payer: Self-pay

## 2024-02-28 ENCOUNTER — Other Ambulatory Visit: Payer: Self-pay

## 2024-02-28 ENCOUNTER — Other Ambulatory Visit (HOSPITAL_COMMUNITY): Payer: Self-pay

## 2024-02-28 DIAGNOSIS — I77810 Thoracic aortic ectasia: Secondary | ICD-10-CM

## 2024-02-29 ENCOUNTER — Ambulatory Visit: Attending: Cardiology | Admitting: Cardiology

## 2024-02-29 ENCOUNTER — Encounter: Payer: Self-pay | Admitting: Cardiology

## 2024-02-29 VITALS — BP 138/82 | HR 80 | Resp 16 | Ht 66.0 in | Wt 229.0 lb

## 2024-02-29 DIAGNOSIS — G459 Transient cerebral ischemic attack, unspecified: Secondary | ICD-10-CM

## 2024-02-29 DIAGNOSIS — I10 Essential (primary) hypertension: Secondary | ICD-10-CM

## 2024-02-29 DIAGNOSIS — Z9889 Other specified postprocedural states: Secondary | ICD-10-CM | POA: Diagnosis not present

## 2024-02-29 DIAGNOSIS — E7800 Pure hypercholesterolemia, unspecified: Secondary | ICD-10-CM

## 2024-02-29 DIAGNOSIS — I5032 Chronic diastolic (congestive) heart failure: Secondary | ICD-10-CM

## 2024-02-29 DIAGNOSIS — I4819 Other persistent atrial fibrillation: Secondary | ICD-10-CM

## 2024-02-29 DIAGNOSIS — I2584 Coronary atherosclerosis due to calcified coronary lesion: Secondary | ICD-10-CM

## 2024-02-29 DIAGNOSIS — E119 Type 2 diabetes mellitus without complications: Secondary | ICD-10-CM

## 2024-02-29 DIAGNOSIS — I77810 Thoracic aortic ectasia: Secondary | ICD-10-CM

## 2024-02-29 DIAGNOSIS — Z7901 Long term (current) use of anticoagulants: Secondary | ICD-10-CM

## 2024-02-29 DIAGNOSIS — Z8679 Personal history of other diseases of the circulatory system: Secondary | ICD-10-CM

## 2024-02-29 MED ORDER — ISOSORBIDE MONONITRATE ER 30 MG PO TB24
30.0000 mg | ORAL_TABLET | Freq: Every day | ORAL | 3 refills | Status: AC
  Filled 2024-02-29: qty 90, 90d supply, fill #0

## 2024-02-29 NOTE — Patient Instructions (Signed)
 Medication Instructions:  Stop: Isosorbide  Dinitrate (Isordil )  Start: Isosorbide  Mononitrate (Imdur) 30 mg, one tablet, once daily  *If you need a refill on your cardiac medications before your next appointment, please call your pharmacy*  Lab Work: None  Follow-Up: At [facility], you and your health needs are our priority.  As part of our continuing mission to provide you with exceptional heart care, our providers are all part of one team.  This team includes your primary Cardiologist (physician) and Advanced Practice Providers or APPs (Physician Assistants and Nurse Practitioners) who all work together to provide you with the care you need, when you need it.  Your next appointment:    December 2026 (after your Echocardiogram)  Provider:   Madonna Large, DO

## 2024-02-29 NOTE — Progress Notes (Signed)
 Cardiology Office Note:  .   Date:  02/29/2024  ID:  [name], DOB 01-21-1961, MRN [id number] PCP:  Ilah Crigler, MD  Former Cardiology Providers: Dr. Ronal Ross [facility] Providers Cardiologist:  Madonna Large, DO , [facility] (established care 01/13/24)  Cardiology APP:  Rana Lum CROME, NP  Electrophysiologist:  OLE ONEIDA HOLTS, MD  Electrophysiologist:  OLE ONEIDA HOLTS, MD  Click to update primary MD,subspecialty MD or APP then REFRESH:1}    Chief Complaint  Patient presents with   Follow-up    Reevaluation of chest pain, discuss test results    History of Present Illness: .   [name] is a 62 y.o. Caucasian female whose past medical history and cardiovascular risk factors includes: Severe coronary artery calcification, persistent atrial fibrillation status post ablation, hypertension, hyperlipidemia, diabetes, renal cancer status post right renal cryoablation, TIA, thyroid  cancer status post total thyroidectomy in 2016 on Synthroid ,   Formally under the care of Dr. Ronal Ross who last saw [name] back in January 2024. I am seeing her for the first time to re-establishing care.   Given her history of TIA she had an ILR placed to monitor for A-fib.  She was initially diagnosed with atrial fibrillation in March 2023 by her ILR.  She was started on medical therapy as well as anticoagulation for thromboembolic prophylaxis.  Later that month in March 2025 she was reported to have increased burden of A-fib and was referred to A-fib clinic.  She eventually underwent atrial fibrillation ablation with Dr. Holts on 11/04/2023.   During her ablation workup she had a pulmonary vein study which noted a total CAC of 1665 placing her at the 99th percentile.  At the last office visit she was endorsing chest tightness and shortness of breath and that shared decision was to uptitrate antianginal therapy, proceed forward with echocardiography as well as cardiac PET/CT.   She was also referred to pulmonary for sleep apnea evaluation.   Since last office visit she had a cardiac PET/CT which was a low risk study and echocardiogram noted preserved LVEF without any significant valvular heart disease.  Patient presents today for follow-up.   She was seen in the ED on 02/05/2024 for emesis and chest pain.  Documentation reviewed.  Since last office visit patient states that her chest pain has not reoccurred.  Isosorbide  dinitrate has helped her symptoms significantly.  No use of sublingual nitroglycerin  tablets since the last office visit.  Review of Systems: .   Review of Systems  Cardiovascular:  Positive for dyspnea on exertion. Negative for chest pain, claudication, irregular heartbeat, leg swelling, near-syncope, orthopnea, palpitations, paroxysmal nocturnal dyspnea and syncope.  Respiratory:  Positive for snoring. Negative for shortness of breath.   Hematologic/Lymphatic: Negative for bleeding problem.    Studies Reviewed:   Atrial fibrillation ablation: Date 11/04/2023 1. Successful PVI 2. Successful ablation/isolation of the posterior wall 3. Intracardiac echo reveals trivial pericardial effusion, normal LA architecture 4. No early apparent complications. 5. Colchicine  0.6mg  PO BID x 5 days   EKG: 01/11/2024: Sinus rhythm, 75 bpm, consider old anteroseptal/inferior infarct age undetermined  Echocardiogram: 09/2020 LVEF 60 to 65%. Indeterminate diastolic function, elevated left atrial pressure, right ventricular size and function normal. No significant valvular heart disease.  02/15/2024 LVEF 60 to 65%. Average GLS -26.4%. Right ventricular size and function normal. Mild LAE. Ascending aorta 42 mm. Estimated RAP 3 mm  CT cardiac morphology pulmonary vein 10/14/2023 1. There is normal pulmonary vein  drainage into the left atrium with ostial measurements above.   2. There is no thrombus in the left atrial appendage.   3. The esophagus runs in  the left atrial midline and is not in proximity to any of the pulmonary vein ostia.   4. No PFO/ASD.   5. Normal coronary origin. Right dominance.   6. CAC score of 1665 which is 99 percentile for age-, race-, and sex-matched controls.  Cardiac PET/CT 01/18/2024   Findings are consistent with no epicardial perfusion defect.  Decrease flow reserve can be seen in microvascular disease (high resting flows, no evidence of TID of decrease in ventricular function). The study is intermediate risk due to the decrease in flow reserve.   LV perfusion is normal. There is no evidence of ischemia. There is no evidence of infarction.   Rest left ventricular function is normal. Rest EF: 65%. Stress left ventricular function is normal. Stress EF: 66%. End diastolic cavity size is normal. End systolic cavity size is normal.   Myocardial blood flow was computed to be 1.6ml/g/min at rest and 2.74ml/g/min at stress. Global myocardial blood flow reserve was 1.74 and was mildly abnormal.   Coronary calcium  was present on the attenuation correction CT images. Severe coronary calcifications were present. Coronary calcifications were present in the left anterior descending artery, left circumflex artery and right coronary artery distribution(s). Aortic atherosclerosis.  Pulmonary artery dilation on non-contrast study.  RADIOLOGY: N/A  Risk Assessment/Calculations:   Click Here to Calculate/Change CHADS2VASc Score The patient's CHADS2-VASc score is 6, indicating a 9.7% annual risk of stroke.  CHF History: Yes HTN History: Yes Diabetes History: Yes Stroke History: Yes Vascular Disease History: No    Labs:       Latest Ref Rng & Units 02/05/2024    5:25 PM 12/08/2023    2:25 PM 12/05/2023   12:40 PM  CBC  WBC 4.0 - 10.5 K/uL 8.7  7.9  6.9   Hemoglobin 12.0 - 15.0 g/dL 87.9  89.9  89.2   Hematocrit 36.0 - 46.0 % 38.9  31.7  34.1   Platelets 150 - 400 K/uL 340  281  284        Latest Ref Rng & Units  02/05/2024    5:25 PM 12/05/2023   12:40 PM 10/25/2023   10:17 AM  BMP  Glucose 70 - 99 mg/dL 809  849  784   BUN 8 - 23 mg/dL 15  11  20    Creatinine 0.44 - 1.00 mg/dL 8.86  8.89  8.76   BUN/Creat Ratio 12 - 28  10  16    Sodium 135 - 145 mmol/L 136  142  131   Potassium 3.5 - 5.1 mmol/L 4.4  4.1  5.0   Chloride 98 - 111 mmol/L 102  109  100   CO2 22 - 32 mmol/L 21  17  19    Calcium  8.9 - 10.3 mg/dL 8.4  8.3  9.1       Latest Ref Rng & Units 02/05/2024    6:17 PM 02/05/2024    5:25 PM 12/05/2023   12:40 PM  CMP  Glucose 70 - 99 mg/dL  809  849   BUN 8 - 23 mg/dL  15  11   Creatinine 9.55 - 1.00 mg/dL  8.86  8.89   Sodium 864 - 145 mmol/L  136  142   Potassium 3.5 - 5.1 mmol/L  4.4  4.1   Chloride 98 - 111 mmol/L  102  109   CO2 22 - 32 mmol/L  21  17   Calcium  8.9 - 10.3 mg/dL  8.4  8.3   Total Protein 6.5 - 8.1 g/dL 7.0     Total Bilirubin 0.0 - 1.2 mg/dL 1.0     Alkaline Phos 38 - 126 U/L 83     AST 15 - 41 U/L 20     ALT 0 - 44 U/L 19       Lab Results  Component Value Date   CHOL 129 04/22/2022   HDL 44 04/22/2022   LDLCALC 54 04/22/2022   LDLDIRECT 39 06/22/2023   TRIG 191 (H) 04/22/2022   CHOLHDL 2.9 04/22/2022   No results for input(s): LIPOA in the last 8760 hours. No components found for: NTPROBNP No results for input(s): PROBNP in the last 8760 hours. No results for input(s): TSH in the last 8760 hours.  Physical Exam:    Today's Vitals   02/29/24 1514  BP: 138/82  Pulse: 80  Resp: 16  SpO2: 96%  Weight: 229 lb (103.9 kg)  Height: 5' 6 (1.676 m)   Body mass index is 36.96 kg/m. Wt Readings from Last 3 Encounters:  02/29/24 229 lb (103.9 kg)  02/10/24 229 lb 9.6 oz (104.1 kg)  02/05/24 227 lb 15.3 oz (103.4 kg)    Physical Exam  Constitutional: No distress.  hemodynamically stable  HENT:  Poor dentition  Neck: No JVD present.  Cardiovascular: Normal rate, regular rhythm, S1 normal and S2 normal. Exam reveals no gallop, no S3 and no  S4.  No murmur heard. Pulmonary/Chest: Effort normal and breath sounds normal. No stridor. She has no wheezes. She has no rales.  Musculoskeletal:        General: Edema (trace) present.     Cervical back: Neck supple.  Skin: Skin is warm.   Impression & Recommendation(s):  Impression:   ICD-10-CM   1. Coronary atherosclerosis due to severely calcified coronary lesion  I25.84     2. Chronic diastolic heart failure (HCC)  P49.67     3. Persistent atrial fibrillation (HCC)  I48.19     4. S/P ablation of atrial fibrillation  Z98.890    Z86.79     5. Long term (current) use of anticoagulants  Z79.01     6. Ascending aorta dilatation  I77.810     7. TIA (transient ischemic attack)  G45.9     8. Type 2 diabetes mellitus without complication, without long-term current use of insulin  (HCC)  E11.9     9. Pure hypercholesterolemia  E78.00     10. Essential hypertension  I10         Recommendation(s):  Coronary atherosclerosis due to severely calcified coronary lesion CAC 1665, 99th percentile EKG is nonischemic. Echo 02/15/2024: LVEF 60 to 65%, diastolic function is normal, right ventricular size and function normal, no significant valvular heart disease.  Cardiac PET/CT 12/2023: No reversible perfusion defect, likely microvascular dysfunction. Antianginal therapy: Beta-blockers, Imdur, sublingual nitroglycerin  tablet on as needed basis  Discontinue isosorbide  dinitrate 10 mg p.o. twice daily. Start Imdur 30 mg p.o. daily Not on aspirin  as she is currently on Eliquis  for thromboembolic prophylaxis. Continue Lipitor 80 mg p.o. nightly Reemphasized importance of secondary prevention.  Chronic diastolic heart failure (HCC) NYHA class II Continue spironolactone  12.5 mg p.o. daily; had an episode of hyperkalemia while on 25 mg p.o. daily  Jardiance  discontinued, secondary to yeast infection. Lasix  and ARB are listed as allergy. Continue metoprolol  to tartrate  100 mg p.o. twice  daily. Continue spironolactone  12.5 mg p.o. daily. Transition isosorbide  dinitrate to isosorbide  mononitrate Reemphasized importance of low-salt diet.  Persistent atrial fibrillation (HCC) S/P ablation of atrial fibrillation Long term (current) use of anticoagulants Rate atrial: Metoprolol . Rhythm control: N/A. Thromboembolic prophylaxis: Eliquis  Status post atrial fibrillation ablation August 2025 Risks, benefits, alternatives to anticoagulation discussed Reemphasize importance of being evaluated for sleep apnea-tentatively scheduled to see pulmonary medicine in February 2025  Aortic dilatation. Echo November 2025: Ascending aorta 42 mm.   Follow-up echocardiogram scheduled for November 2026  Reemphasized importance of blood pressure management, goal SBP <130 mmHg Discussed the symptoms associated with aortic syndromes and if present should go to closest ER via EMS for further evaluation and management.   Patient informs me that her maternal grandfather at the age of 4 had complications of aortic dissection.  Otherwise no other family members with aortic syndromes.    TIA (transient ischemic attack) Reemphasize importance of secondary prevention  Type 2 diabetes mellitus without complication, without long-term current use of insulin  (HCC) Reemphasized importance of glycemic control. Currently on Ozempic , atorvastatin  ARB listed as allergies Recommend a goal LDL <70 mg/dL and triglycerides <850 mg/dL.  Triglycerides are not at goal.  Pure hypercholesterolemia Continue atorvastatin  80 mg p.o. nightly Recommend checking LP(a).  Essential hypertension Office blood pressures are acceptable.  See above  Snoring Pulmonary consult placed for sleep apnea evaluation, high probability Tentatively scheduled for February 2026.   Orders Placed:  No orders of the defined types were placed in this encounter.    Final Medication List:    Meds ordered this encounter  Medications    isosorbide  mononitrate (IMDUR) 30 MG 24 hr tablet    Sig: Take 1 tablet (30 mg total) by mouth daily.    Dispense:  90 tablet    Refill:  3    Medications Discontinued During This Encounter  Medication Reason   isosorbide  dinitrate (ISORDIL ) 10 MG tablet Discontinued by provider     Current Outpatient Medications:    acetaminophen  (TYLENOL ) 500 MG tablet, Take 2 tablets (1,000 mg total) by mouth every 6 (six) hours as needed (body aches)., Disp: 30 tablet, Rfl: 0   albuterol  (VENTOLIN  HFA) 108 (90 Base) MCG/ACT inhaler, Inhale 2 puffs every 4 to 6 hours as needed., Disp: 18 g, Rfl: 2   apixaban  (ELIQUIS ) 5 MG TABS tablet, Take 1 tablet (5 mg total) by mouth 2 (two) times daily., Disp: 60 tablet, Rfl: 11   atorvastatin  (LIPITOR) 80 MG tablet, Take 1 tablet (80 mg total) by mouth at bedtime., Disp: 90 tablet, Rfl: 3   Azelastine  HCl 137 MCG/SPRAY SOLN, Place 1 spray into the nose 2 (two) times daily., Disp: 30 mL, Rfl: 0   CALCIUM -MAGNESIUM -ZINC PO, Take 2 tablets by mouth daily., Disp: , Rfl:    Continuous Glucose Sensor (FREESTYLE LIBRE 3 SENSOR) MISC, Change sensor every two weeks, Disp: 6 each, Rfl: 3   diclofenac  Sodium (VOLTAREN ) 1 % GEL, Apply to affected area(s) twice daily as directed, Disp: 100 g, Rfl: 0   diphenhydrAMINE  (BENADRYL ) 25 MG tablet, Take 25 mg by mouth at bedtime., Disp: , Rfl:    ibuprofen  (ADVIL ) 200 MG tablet, Take 800 mg by mouth every 8 (eight) hours as needed for headache or moderate pain., Disp: , Rfl:    isosorbide  mononitrate (IMDUR) 30 MG 24 hr tablet, Take 1 tablet (30 mg total) by mouth daily., Disp: 90 tablet, Rfl: 3   levothyroxine  (SYNTHROID ) 150 MCG  tablet, Take 1 tablet (150 mcg total) by mouth daily., Disp: 90 tablet, Rfl: 3   melatonin 5 MG TABS, Take 3 mg by mouth at bedtime., Disp: , Rfl:    metFORMIN  (GLUCOPHAGE ) 500 MG tablet, Take 1 tablet (500 mg total) by mouth 2 (two) times daily with a meal., Disp: 60 tablet, Rfl: 4   metoprolol  tartrate  (LOPRESSOR ) 100 MG tablet, Take 1 tablet (100 mg total) by mouth 2 (two) times daily., Disp: 180 tablet, Rfl: 3   nitroGLYCERIN  (NITROSTAT ) 0.4 MG SL tablet, Place 1 tablet (0.4 mg total) under the tongue every 5 (five) minutes as needed for chest pain., Disp: 25 tablet, Rfl: 3   ondansetron  (ZOFRAN -ODT) 4 MG disintegrating tablet, 4mg  ODT q4 hours prn nausea/vomit, Disp: 6 tablet, Rfl: 0   pantoprazole  (PROTONIX ) 40 MG tablet, Take 1 tablet (40 mg total) by mouth daily., Disp: 90 tablet, Rfl: 1   Probiotic Product (PROBIOTIC PO), Take 1 tablet by mouth daily., Disp: , Rfl:    Semaglutide , 1 MG/DOSE, (OZEMPIC , 1 MG/DOSE,) 4 MG/3ML SOPN, Inject 1 mg into the skin once a week., Disp: 9 mL, Rfl: 2   spironolactone  (ALDACTONE ) 25 MG tablet, Take 0.5 tablets (12.5 mg total) by mouth daily., Disp: , Rfl:   Consent:    NA  Disposition:   1 year follow-up sooner if needed  Her questions and concerns were addressed to her satisfaction. She voices understanding of the recommendations provided during this encounter.    Signed, Madonna Michele HAS, [facility]  A Division of  [facility] [address], [place], [place] [address]  02/29/2024 6:21 PM

## 2024-03-01 ENCOUNTER — Other Ambulatory Visit: Payer: Self-pay

## 2024-03-01 ENCOUNTER — Other Ambulatory Visit (HOSPITAL_COMMUNITY): Payer: Self-pay

## 2024-03-01 ENCOUNTER — Other Ambulatory Visit (HOSPITAL_COMMUNITY): Payer: Self-pay | Admitting: Physician Assistant

## 2024-03-02 ENCOUNTER — Encounter: Payer: Self-pay | Admitting: Cardiology

## 2024-03-04 ENCOUNTER — Ambulatory Visit

## 2024-03-05 ENCOUNTER — Encounter

## 2024-03-05 LAB — CUP PACEART REMOTE DEVICE CHECK
Date Time Interrogation Session: 20251206230451
Implantable Pulse Generator Implant Date: 20220307

## 2024-03-05 NOTE — Telephone Encounter (Signed)
Sure.   ST

## 2024-03-06 ENCOUNTER — Other Ambulatory Visit: Payer: Self-pay

## 2024-03-06 ENCOUNTER — Other Ambulatory Visit (HOSPITAL_COMMUNITY): Payer: Self-pay

## 2024-03-06 ENCOUNTER — Other Ambulatory Visit (HOSPITAL_BASED_OUTPATIENT_CLINIC_OR_DEPARTMENT_OTHER): Payer: Self-pay

## 2024-03-06 ENCOUNTER — Other Ambulatory Visit: Payer: Self-pay | Admitting: Cardiology

## 2024-03-06 MED ORDER — SPIRONOLACTONE 25 MG PO TABS
25.0000 mg | ORAL_TABLET | Freq: Every day | ORAL | 2 refills | Status: DC
  Filled 2024-03-06: qty 30, 30d supply, fill #0

## 2024-03-06 MED ORDER — SPIRONOLACTONE 25 MG PO TABS
12.5000 mg | ORAL_TABLET | Freq: Every day | ORAL | 1 refills | Status: AC
  Filled 2024-03-06 – 2024-03-09 (×2): qty 45, 90d supply, fill #0

## 2024-03-09 ENCOUNTER — Other Ambulatory Visit (HOSPITAL_COMMUNITY): Payer: Self-pay

## 2024-03-09 ENCOUNTER — Other Ambulatory Visit: Payer: Self-pay

## 2024-03-12 ENCOUNTER — Other Ambulatory Visit (HOSPITAL_COMMUNITY): Payer: Self-pay

## 2024-03-12 MED ORDER — METFORMIN HCL 500 MG PO TABS
500.0000 mg | ORAL_TABLET | Freq: Two times a day (BID) | ORAL | 1 refills | Status: AC
  Filled 2024-03-12: qty 60, 30d supply, fill #0
  Filled 2024-04-10 – 2024-04-11 (×2): qty 60, 30d supply, fill #1

## 2024-03-13 ENCOUNTER — Other Ambulatory Visit (HOSPITAL_COMMUNITY): Payer: Self-pay

## 2024-03-13 NOTE — Progress Notes (Signed)
 Remote Loop Recorder Transmission

## 2024-03-16 ENCOUNTER — Ambulatory Visit: Payer: Self-pay | Admitting: Cardiology

## 2024-03-30 ENCOUNTER — Other Ambulatory Visit (HOSPITAL_COMMUNITY): Payer: Self-pay

## 2024-03-30 ENCOUNTER — Other Ambulatory Visit (HOSPITAL_BASED_OUTPATIENT_CLINIC_OR_DEPARTMENT_OTHER): Payer: Self-pay

## 2024-04-03 ENCOUNTER — Other Ambulatory Visit (HOSPITAL_COMMUNITY): Payer: Self-pay

## 2024-04-04 ENCOUNTER — Ambulatory Visit

## 2024-04-04 DIAGNOSIS — I5032 Chronic diastolic (congestive) heart failure: Secondary | ICD-10-CM

## 2024-04-05 ENCOUNTER — Encounter

## 2024-04-05 LAB — CUP PACEART REMOTE DEVICE CHECK
Date Time Interrogation Session: 20260106230604
Implantable Pulse Generator Implant Date: 20220307

## 2024-04-07 ENCOUNTER — Ambulatory Visit: Payer: Self-pay | Admitting: Cardiology

## 2024-04-09 NOTE — Progress Notes (Signed)
 Remote Loop Recorder Transmission

## 2024-04-10 ENCOUNTER — Other Ambulatory Visit (HOSPITAL_COMMUNITY): Payer: Self-pay

## 2024-04-11 ENCOUNTER — Other Ambulatory Visit: Payer: Self-pay

## 2024-04-20 ENCOUNTER — Other Ambulatory Visit (HOSPITAL_COMMUNITY): Payer: Self-pay

## 2024-04-20 ENCOUNTER — Other Ambulatory Visit: Payer: Self-pay

## 2024-04-22 ENCOUNTER — Other Ambulatory Visit (HOSPITAL_COMMUNITY): Payer: Self-pay

## 2024-04-22 MED ORDER — FREESTYLE LIBRE 3 SENSOR MISC
1 refills | Status: AC
  Filled 2024-04-22: qty 6, 84d supply, fill #0

## 2024-04-23 ENCOUNTER — Other Ambulatory Visit (HOSPITAL_COMMUNITY): Payer: Self-pay

## 2024-04-26 ENCOUNTER — Other Ambulatory Visit: Payer: Self-pay

## 2024-05-03 ENCOUNTER — Other Ambulatory Visit (HOSPITAL_COMMUNITY): Payer: Self-pay

## 2024-05-05 ENCOUNTER — Encounter

## 2024-05-07 ENCOUNTER — Encounter

## 2024-05-17 ENCOUNTER — Ambulatory Visit (HOSPITAL_BASED_OUTPATIENT_CLINIC_OR_DEPARTMENT_OTHER): Admitting: Pulmonary Disease

## 2024-06-05 ENCOUNTER — Encounter

## 2024-06-07 ENCOUNTER — Encounter

## 2024-07-09 ENCOUNTER — Encounter
